# Patient Record
Sex: Female | Born: 1935 | Race: White | Hispanic: No | Marital: Married | Smoking: Never smoker
Health system: Southern US, Community
[De-identification: ages and names within clinical notes are randomized; demographics above are authoritative.]

## PROBLEM LIST (undated history)

## (undated) DIAGNOSIS — I255 Ischemic cardiomyopathy: Secondary | ICD-10-CM

## (undated) DIAGNOSIS — N390 Urinary tract infection, site not specified: Secondary | ICD-10-CM

## (undated) DIAGNOSIS — E785 Hyperlipidemia, unspecified: Secondary | ICD-10-CM

## (undated) DIAGNOSIS — M1990 Unspecified osteoarthritis, unspecified site: Secondary | ICD-10-CM

## (undated) DIAGNOSIS — I10 Essential (primary) hypertension: Secondary | ICD-10-CM

## (undated) DIAGNOSIS — M549 Dorsalgia, unspecified: Secondary | ICD-10-CM

## (undated) DIAGNOSIS — G8929 Other chronic pain: Secondary | ICD-10-CM

## (undated) DIAGNOSIS — I219 Acute myocardial infarction, unspecified: Secondary | ICD-10-CM

## (undated) DIAGNOSIS — C801 Malignant (primary) neoplasm, unspecified: Secondary | ICD-10-CM

## (undated) DIAGNOSIS — R42 Dizziness and giddiness: Secondary | ICD-10-CM

## (undated) DIAGNOSIS — E039 Hypothyroidism, unspecified: Secondary | ICD-10-CM

## (undated) DIAGNOSIS — F0390 Unspecified dementia without behavioral disturbance: Secondary | ICD-10-CM

## (undated) DIAGNOSIS — R739 Hyperglycemia, unspecified: Secondary | ICD-10-CM

## (undated) DIAGNOSIS — N184 Chronic kidney disease, stage 4 (severe): Secondary | ICD-10-CM

## (undated) DIAGNOSIS — E1121 Type 2 diabetes mellitus with diabetic nephropathy: Secondary | ICD-10-CM

## (undated) DIAGNOSIS — I2510 Atherosclerotic heart disease of native coronary artery without angina pectoris: Secondary | ICD-10-CM

## (undated) HISTORY — PX: BREAST LUMPECTOMY: SHX2

## (undated) HISTORY — PX: CORONARY STENT PLACEMENT: SHX1402

## (undated) HISTORY — PX: BACK SURGERY: SHX140

---

## 2003-12-15 ENCOUNTER — Ambulatory Visit: Payer: Self-pay | Admitting: Family Medicine

## 2004-01-02 ENCOUNTER — Ambulatory Visit: Payer: Self-pay | Admitting: Family Medicine

## 2007-04-13 ENCOUNTER — Emergency Department (HOSPITAL_COMMUNITY): Admission: EM | Admit: 2007-04-13 | Discharge: 2007-04-13 | Payer: Self-pay | Admitting: Emergency Medicine

## 2011-01-04 DIAGNOSIS — I219 Acute myocardial infarction, unspecified: Secondary | ICD-10-CM

## 2011-01-04 HISTORY — DX: Acute myocardial infarction, unspecified: I21.9

## 2011-04-20 ENCOUNTER — Encounter (HOSPITAL_COMMUNITY): Payer: Self-pay | Admitting: Cardiology

## 2011-04-20 ENCOUNTER — Other Ambulatory Visit: Payer: Self-pay

## 2011-04-20 ENCOUNTER — Inpatient Hospital Stay (HOSPITAL_COMMUNITY)
Admission: AD | Admit: 2011-04-20 | Discharge: 2011-04-23 | DRG: 247 | Disposition: A | Discharge status: Home / Self Care | Payer: Medicare Other | Source: Other Acute Inpatient Hospital | Attending: Cardiology | Admitting: Cardiology

## 2011-04-20 ENCOUNTER — Encounter (HOSPITAL_COMMUNITY)
Admission: AD | Disposition: A | Discharge status: Home / Self Care | Payer: Self-pay | Source: Other Acute Inpatient Hospital | Attending: Cardiology

## 2011-04-20 DIAGNOSIS — I2510 Atherosclerotic heart disease of native coronary artery without angina pectoris: Secondary | ICD-10-CM | POA: Diagnosis present

## 2011-04-20 DIAGNOSIS — I2109 ST elevation (STEMI) myocardial infarction involving other coronary artery of anterior wall: Secondary | ICD-10-CM

## 2011-04-20 DIAGNOSIS — E039 Hypothyroidism, unspecified: Secondary | ICD-10-CM | POA: Diagnosis present

## 2011-04-20 DIAGNOSIS — E785 Hyperlipidemia, unspecified: Secondary | ICD-10-CM | POA: Diagnosis present

## 2011-04-20 DIAGNOSIS — Z7982 Long term (current) use of aspirin: Secondary | ICD-10-CM

## 2011-04-20 DIAGNOSIS — I214 Non-ST elevation (NSTEMI) myocardial infarction: Principal | ICD-10-CM

## 2011-04-20 DIAGNOSIS — I959 Hypotension, unspecified: Secondary | ICD-10-CM | POA: Diagnosis present

## 2011-04-20 DIAGNOSIS — I255 Ischemic cardiomyopathy: Secondary | ICD-10-CM | POA: Diagnosis present

## 2011-04-20 DIAGNOSIS — I2589 Other forms of chronic ischemic heart disease: Secondary | ICD-10-CM | POA: Diagnosis present

## 2011-04-20 DIAGNOSIS — R7309 Other abnormal glucose: Secondary | ICD-10-CM | POA: Diagnosis present

## 2011-04-20 HISTORY — DX: Hyperlipidemia, unspecified: E78.5

## 2011-04-20 HISTORY — PX: LEFT HEART CATHETERIZATION WITH CORONARY ANGIOGRAM: SHX5451

## 2011-04-20 HISTORY — DX: Atherosclerotic heart disease of native coronary artery without angina pectoris: I25.10

## 2011-04-20 HISTORY — DX: Ischemic cardiomyopathy: I25.5

## 2011-04-20 HISTORY — DX: Hyperglycemia, unspecified: R73.9

## 2011-04-20 HISTORY — DX: Hypothyroidism, unspecified: E03.9

## 2011-04-20 LAB — CARDIAC PANEL(CRET KIN+CKTOT+MB+TROPI)
CK, MB: 36.3 ng/mL (ref 0.3–4.0)
Relative Index: 10.1 — ABNORMAL HIGH (ref 0.0–2.5)
Relative Index: 10.5 — ABNORMAL HIGH (ref 0.0–2.5)
Total CK: 358 U/L — ABNORMAL HIGH (ref 7–177)
Total CK: 255 U/L — ABNORMAL HIGH (ref 7–177)

## 2011-04-20 LAB — TSH: TSH: 0.911 u[IU]/mL (ref 0.350–4.500)

## 2011-04-20 LAB — APTT: aPTT: 66 seconds — ABNORMAL HIGH (ref 24–37)

## 2011-04-20 LAB — MRSA PCR SCREENING: MRSA by PCR: NEGATIVE

## 2011-04-20 LAB — HEMOGLOBIN A1C: Mean Plasma Glucose: 111 mg/dL (ref <117)

## 2011-04-20 LAB — PROTIME-INR: Prothrombin Time: 13.8 seconds (ref 11.6–15.2)

## 2011-04-20 LAB — POCT ACTIVATED CLOTTING TIME: Activated Clotting Time: 100 seconds

## 2011-04-20 SURGERY — LEFT HEART CATHETERIZATION WITH CORONARY ANGIOGRAM
Anesthesia: LOCAL

## 2011-04-20 MED ORDER — NITROGLYCERIN 0.2 MG/ML ON CALL CATH LAB
INTRAVENOUS | Status: AC
  Filled 2011-04-20: qty 1

## 2011-04-20 MED ORDER — ZOLPIDEM TARTRATE 5 MG PO TABS: 5.0000 mg | ORAL_TABLET | Freq: Every evening | ORAL | Status: DC | PRN

## 2011-04-20 MED ORDER — DIAZEPAM 5 MG PO TABS
5.0000 mg | ORAL_TABLET | ORAL | Status: AC
  Administered 2011-04-20: 5 mg via ORAL
  Filled 2011-04-20: qty 1

## 2011-04-20 MED ORDER — SODIUM CHLORIDE 0.9 % IJ SOLN: 3.0000 mL | INTRAMUSCULAR | Status: DC | PRN

## 2011-04-20 MED ORDER — NITROGLYCERIN 0.4 MG SL SUBL
0.4000 mg | SUBLINGUAL_TABLET | SUBLINGUAL | Status: DC | PRN
  Administered 2011-04-21 (×2): 0.4 mg via SUBLINGUAL
  Filled 2011-04-20: qty 75

## 2011-04-20 MED ORDER — OMEGA-3-ACID ETHYL ESTERS 1 G PO CAPS
1.0000 g | ORAL_CAPSULE | Freq: Every day | ORAL | Status: DC
  Administered 2011-04-21 – 2011-04-23 (×3): 1 g via ORAL
  Filled 2011-04-20 (×4): qty 1

## 2011-04-20 MED ORDER — LIDOCAINE HCL (PF) 1 % IJ SOLN
INTRAMUSCULAR | Status: AC
  Filled 2011-04-20: qty 30

## 2011-04-20 MED ORDER — SODIUM CHLORIDE 0.9 % IV SOLN: 250.0000 mL | INTRAVENOUS | Status: DC | PRN

## 2011-04-20 MED ORDER — LEVOTHYROXINE SODIUM 100 MCG PO TABS
100.0000 ug | ORAL_TABLET | Freq: Every day | ORAL | Status: DC
  Administered 2011-04-21 – 2011-04-23 (×3): 100 ug via ORAL
  Filled 2011-04-20 (×4): qty 1

## 2011-04-20 MED ORDER — SODIUM CHLORIDE 0.9 % IV SOLN
1.0000 mL/kg/h | INTRAVENOUS | Status: DC
  Administered 2011-04-20: 1 mL/kg/h via INTRAVENOUS

## 2011-04-20 MED ORDER — ASPIRIN 81 MG PO CHEW
81.0000 mg | CHEWABLE_TABLET | Freq: Every day | ORAL | Status: DC
Start: 2011-04-21 — End: 2011-04-23
  Administered 2011-04-22 – 2011-04-23 (×2): 81 mg via ORAL
  Filled 2011-04-20 (×2): qty 1

## 2011-04-20 MED ORDER — ASPIRIN 81 MG PO CHEW
324.0000 mg | CHEWABLE_TABLET | ORAL | Status: AC
  Administered 2011-04-20: 324 mg via ORAL
  Filled 2011-04-20: qty 15
  Filled 2011-04-20: qty 4

## 2011-04-20 MED ORDER — HEPARIN (PORCINE) IN NACL 2-0.9 UNIT/ML-% IJ SOLN
INTRAMUSCULAR | Status: AC
  Filled 2011-04-20: qty 2000

## 2011-04-20 MED ORDER — FENTANYL CITRATE 0.05 MG/ML IJ SOLN
INTRAMUSCULAR | Status: AC
  Filled 2011-04-20: qty 2

## 2011-04-20 MED ORDER — ACETAMINOPHEN 325 MG PO TABS: 650.0000 mg | ORAL_TABLET | ORAL | Status: DC | PRN

## 2011-04-20 MED ORDER — OMEGA-3 FATTY ACIDS 1000 MG PO CAPS: 1.0000 g | ORAL_CAPSULE | Freq: Every day | ORAL | Status: DC

## 2011-04-20 MED ORDER — METOPROLOL TARTRATE 12.5 MG HALF TABLET
12.5000 mg | ORAL_TABLET | Freq: Two times a day (BID) | ORAL | Status: DC
  Administered 2011-04-20 – 2011-04-23 (×6): 12.5 mg via ORAL
  Filled 2011-04-20 (×8): qty 1

## 2011-04-20 MED ORDER — ATORVASTATIN CALCIUM 80 MG PO TABS
80.0000 mg | ORAL_TABLET | Freq: Every day | ORAL | Status: DC
  Administered 2011-04-20 – 2011-04-22 (×3): 80 mg via ORAL
  Filled 2011-04-20 (×5): qty 1

## 2011-04-20 MED ORDER — ONDANSETRON HCL 4 MG/2ML IJ SOLN: 4.0000 mg | Freq: Four times a day (QID) | INTRAMUSCULAR | Status: DC | PRN

## 2011-04-20 MED ORDER — SODIUM CHLORIDE 0.9 % IV SOLN: INTRAVENOUS | Status: AC

## 2011-04-20 MED ORDER — SODIUM CHLORIDE 0.9 % IJ SOLN
3.0000 mL | Freq: Two times a day (BID) | INTRAMUSCULAR | Status: DC
  Administered 2011-04-21 – 2011-04-23 (×5): 3 mL via INTRAVENOUS

## 2011-04-20 MED ORDER — HEPARIN (PORCINE) IN NACL 100-0.45 UNIT/ML-% IJ SOLN
900.0000 [IU]/h | INTRAMUSCULAR | Status: DC
  Administered 2011-04-21: 900 [IU]/h via INTRAVENOUS
  Filled 2011-04-20: qty 250

## 2011-04-20 NOTE — Progress Notes (Signed)
TO CATH LAB BY BED.

## 2011-04-20 NOTE — Progress Notes (Signed)
BACK FROM THE CATH . LAB BY BED AWAKE AND ALERT. INSTRUCTED NOT TO BEND RIGHT KNEE AND BEDREST EMPHASIZED. TO CALL FOR ANY SIGN OF BLEEDING TO RIGHT GROIN. CONTINUE TO MONITOR.

## 2011-04-20 NOTE — Progress Notes (Signed)
ANTICOAGULATION CONSULT NOTE - Initial Consult  Pharmacy Consult for Heparin Indication: chest pain/ACS  Allergies  Allergen Reactions  . Penicillins     Rash     Patient Measurements: Height: 5\' 2"  (157.5 cm) Weight: 152 lb (68.947 kg) IBW/kg (Calculated) : 50.1  Heparin Dosing Weight: 68.9 kg  Vital Signs: Temp: 98 F (36.7 C) (04/17 1600) Temp src: Oral (04/17 1600) BP: 111/95 mmHg (04/17 1500) Pulse Rate: 72  (04/17 1500)  Labs: No results found for this basename: HGB:2,HCT:3,PLT:3,APTT:3,LABPROT:3,INR:3,HEPARINUNFRC:3,CREATININE:3,CKTOTAL:3,CKMB:3,TROPONINI:3 in the last 72 hours CrCl is unknown because no creatinine reading has been taken.  Medical History: Past Medical History  Diagnosis Date  . Hypothyroidism     Medications:  Infusions:    . sodium chloride      Assessment: 78yo female admitted from [facility] with chest pain and ACS with positive Troponin found to have NSTEMI on EKG. Patient has no prior cardiac history. Currently chest pain free. Plan for cardiac cath to start IV heparin.   No labs available.    Goal of Therapy:  Heparin level 0.3-0.7 units/ml   Plan:  1. Patient is already in cath. 2. Will follow-up post-cath plan.   Fayne Norrie 04/20/2011,4:13 PM

## 2011-04-20 NOTE — H&P (Signed)
History and Physical  Patient ID: Joann Hunter MRN: [id number], SOB: 15-Sep-1932 78 y.o. Date of Encounter: 04/20/2011, 3:50 PM  Primary Physician: Dr. Charm Barges Primary Cardiologist: New  Chief Complaint: NSTEMI  HPI: 78 y.o. female w/ PMHx significant for hypothyroidism and no prior cardiac history who presented to her PCP with c/o chest pain for which she was sent to [facility] where she was found to have an elevated troponin and then transferred to [facility] on 04/20/2011.  She was in her usual state of health until about 3 weeks ago when she noted sob and substernal chest pressure while working in her yard. The pain lasted about 5-10 mins and was relieved with rest. No radiation, nausea, diaphoresis, dizziness, or syncope. Over the last 3wks she has had occasional episodes of exertional cp and dyspnea with activities around the house that normally did not cause her problems like taking the trash to the curb. This morning she was awakened with chest pain, sob, and significant diaphoresis that was not relieved with rest. She went to her PCP who gave her ASA with relief of chest pain (lasted ~1hr) and sent her to [facility] ED.  In the [facility] ED, EKG revealed NSR 84bpm, old anterior infarct, poor R wave progression. CXR showed mild interstitial edema. Labs Na+ 137, K+ 3.9, BUN/Crt 16/0.86, GFR >60, glucose 168, troponin 0.41, CKMB 7.1, total CK 90, WBC 5.2, H&H 13.6/42, plt 171. She was chest pain free in the ED. Started on IV heparin and transferred to [facility] stepdown unit for further evaluation and treatment. EKG at [facility] shows TWI I, aVL, V1-V6. She is chest pain free.   Past Medical History  Diagnosis Date  . Hypothyroidism      Surgical History:  Past Surgical History  Procedure Date  . Breast lumpectomy     right     Home Meds: Medication Sig  aspirin 81 MG chewable tablet Chew 81 mg by mouth daily.  fish oil-omega-3 fatty acids 1000 MG capsule Take 1 g by  mouth daily.  levothyroxine (SYNTHROID, LEVOTHROID) 100 MCG tablet Take 100 mcg by mouth daily.    Allergies:  Allergies  Allergen Reactions  . Penicillins     Rash    Social History  . Marital Status: Married   Occupational History  . Retired   Social History Main Topics  . Smoking status: Never Smoker   . Smokeless tobacco: Never Used  . Alcohol Use: No  . Drug Use: No   Social History Narrative   Lives in [place], [place] with her husband and daughter.     Family History  Problem Relation Age of Onset  . Other      no known family cardiac disease    Review of Systems: General: negative for chills, fever, night sweats or weight changes.  Cardiovascular: As per HPI Dermatological: negative for rash Respiratory: negative for cough or wheezing Urologic: negative for hematuria Abdominal: negative for nausea, vomiting, diarrhea, bright red blood per rectum, melena, or hematemesis Neurologic: negative for visual changes, syncope, or dizziness All other systems reviewed and are otherwise negative except as noted above.  Labs: 04/20/11 @ [facility] Na+ 137, K+ 3.9, BUN/Crt 16/0.86, GFR >60, glucose 168, troponin 0.41, CKMB 7.1, total CK 90, WBC 5.2, H&H 13.6/42, plt 171   Radiology/Studies:   04/20/11 - CXR at [facility] showed mild interstitial edema   EKG: 04/20/11 @ 0931 - SR 84bpm, old anterior infarct, poor R wave progression  04/20/11 @ 1515 - SR 72bpm, poor R wave progression, TWI I, aVL, V1-V6  Physical Exam: Blood pressure 111/95, pulse 72, temperature 97.7 F (36.5 C), temperature source Oral, resp. rate 20, height 5\' 2"  (1.575 m), weight 152 lb (68.947 kg), SpO2 99.00%. General: Well developed, well nourished, elderly white female in no acute distress. Head: Normocephalic, atraumatic, sclera non-icteric, nares are without discharge Neck: Supple. No carotid bruits. JVD not elevated. Lungs: Clear bilaterally to auscultation without wheezes, rales,  or rhonchi. Breathing is unlabored. Heart: RRR with S1 S2. Soft flow murmur LUSB. No rubs or gallops appreciated. Abdomen: Soft, non-tender, non-distended with normoactive bowel sounds. No rebound/guarding. No obvious abdominal masses. Msk:  Strength and tone appear normal for age. Extremities: Trace BLE edema. No clubbing or cyanosis. Distal pedal pulses are intact and equal bilaterally. Neuro: Alert and oriented X 3. Moves all extremities spontaneously. Psych:  Responds to questions appropriately with a normal affect.    ASSESSMENT AND PLAN:  78 y.o. female w/ PMHx significant for hypothyroidism and no prior cardiac history who presented to her PCP with c/o chest pain for which she was sent to [facility] where she was found to have an elevated troponin and then transferred to [facility] on 04/20/2011.  1. NSTEMI: She presents with 3wks of chest pain that worsened this morning. No prior cardiac history. Troponin positive and EKG with poor R wave progression and TWIs. Currently chest pain free. Will need evaluation with cardiac cath. Cont IV heparin and daily ASA. Initiate metoprolol 12.5mg  BID and statin. Check lipid panel, LFTs, TSH, A1C.   Signed, HOPE, JESSICA PA-C 04/20/2011, 3:50 PM  History reviewed with the patient, no changes to be made.  Patient with exertional chest pain over 2 weeks.  However, it progress to resting chest pain that woke her from her sleep this am.  Presented to [facility] with T wave inversion in the anterior leads.  First troponin is positive.  She has been pain free since this am.  No prior cardiac history.  The patient exam reveals Lungs clear, COR soft apical systolic flow murmur, Abd Positive bowel sounds, no rebound no guarding, Ext no edema.  All available labs, radiology testing, previous records reviewed. Agree with documented assessment and plan.  Cath.  The patient understands that risks included but are not limited to stroke (1 in 1000), death (1  in 1000), kidney failure [usually temporary] (1 in 500), bleeding (1 in 200), allergic reaction [possibly serious] (1 in 200).  The patient understands and agrees to proceed.  Start empiric statin and check lipid profile.    Fayrene Fearing Kit Brubacher  4:06 PM 11/19/2010

## 2011-04-20 NOTE — Progress Notes (Addendum)
ADMITTED FROM  [facility] ED BY [facility] AWAKE AND ALERT. HEPARIN AT 6 ML IN PROGRESS TO RIGHT AC.

## 2011-04-20 NOTE — Interval H&P Note (Signed)
History and Physical Interval Note:  04/20/2011 4:36 PM  Joann Hunter  has presented today for surgery, with the diagnosis of cp  The various methods of treatment have been discussed with the patient and family. After consideration of risks, benefits and other options for treatment, the patient has consented to  Procedure(s) (LRB): LEFT HEART CATHETERIZATION WITH CORONARY ANGIOGRAM (N/A) as a surgical intervention .  The patients' history has been reviewed, patient examined, no change in status, stable for surgery.  I have reviewed the patients' chart and labs.  Questions were answered to the patient's satisfaction.     Samera Macy

## 2011-04-20 NOTE — Progress Notes (Signed)
ANTICOAGULATION CONSULT NOTE - Initial Consult  Pharmacy Consult for heparin Indication: severe triple vessel CAD s/p cath. Potential CABG  Allergies  Allergen Reactions  . Penicillins     Rash     Patient Measurements: Height: 5\' 2"  (157.5 cm) Weight: 152 lb (68.947 kg) IBW/kg (Calculated) : 50.1  Heparin Dosing Weight: 64.5 kg  Vital Signs: Temp: 98 F (36.7 C) (04/17 1600) Temp src: Oral (04/17 1600) BP: 111/95 mmHg (04/17 1500) Pulse Rate: 77  (04/17 1645)  Labs:  Basename 04/20/11 1558  HGB --  HCT --  PLT --  APTT 66*  LABPROT 13.8  INR 1.04  HEPARINUNFRC --  CREATININE --  CKTOTAL 358*  CKMB 36.3*  TROPONINI 12.70*   CrCl is unknown because no creatinine reading has been taken.  Medical History: Past Medical History  Diagnosis Date  . Hypothyroidism     Medications:  Scheduled:    . aspirin  324 mg Oral Pre-Cath  . aspirin  81 mg Oral Daily  . atorvastatin  80 mg Oral q1800  . diazepam  5 mg Oral On Call  . fentaNYL      . heparin      . levothyroxine  100 mcg Oral Daily  . lidocaine      . metoprolol tartrate  12.5 mg Oral BID  . nitroGLYCERIN      . omega-3 acid ethyl esters  1 g Oral Daily  . sodium chloride  3 mL Intravenous Q12H  . DISCONTD: fish oil-omega-3 fatty acids  1 g Oral Daily   Infusions:    . sodium chloride 50 mL/hr at 04/20/11 1736  . DISCONTD: sodium chloride 1 mL/kg/hr (04/20/11 1600)    Assessment: 78 yo female with severe triple vessels CAD s/p cath will be restarted on heparin 8 hours post sheath removal. Sheath was removed at ~1735. INR today 1.04.  May potentially have CABG Goal of Therapy:  Heparin level 0.3-0.7 units/ml   Plan:  1) Start heparin drip at 900 units/hr at 0135 on 04/21/11. No bolus 2) Check an 8 hour heparin level after drip is started. 3) Daily heparin level and CBC Lavonda Thal, Tsz-Yin 04/20/2011,6:23 PM

## 2011-04-20 NOTE — CV Procedure (Signed)
   Cardiac Catheterization Operative Report  Joann Hunter [id number] 4/17/20134:36 PM No primary provider on file.  Procedure Performed:  1. Left Heart Catheterization 2. Selective Coronary Angiography 3. Left ventricular angiogram  Operator: Verne Carrow, MD  Indication:   NSTEMI, chest pain, EKG changes.                                    Procedure Details: The risks, benefits, complications, treatment options, and expected outcomes were discussed with the patient. The patient and/or family concurred with the proposed plan, giving informed consent. The patient was brought to the cath lab after IV hydration was begun and oral premedication was given. The patient was further sedated with Fentanyl. The right groin was prepped and draped in the usual manner. Using the modified Seldinger access technique, a 5 French sheath was placed in the right femoral artery. Standard diagnostic catheters were used to perform selective coronary angiography. A pigtail catheter was used to perform a left ventricular angiogram.  There were no immediate complications. The patient was taken to the recovery area in stable condition.   Hemodynamic Findings: Central aortic pressure: 137/64 Left ventricular pressure: 137/4/11  Angiographic Findings:  Left main:  Distal 40% stenosis.   Left Anterior Descending Artery: Large caliber vessel that courses to the apex. The proximal vessel has diffuse 40% calcified stenosis. The mid LAD has 99% stenosis. The first diagonal branch is small to moderate sized with ostial 90% stenosis and mid 90% stenosis. The second diagonal branch is small to moderate sized with ostial 70% stenosis. The distal LAD has diffuse 30% stenosis.   Circumflex Artery: Moderate sized vessel with 80% ostial stenosis. The proximal and mid vessel has serial 30% stenoses. There is a small caliber intermediate branch with 99% proximal stenosis. The OM is a bifurcating vessel with 40%  stenosis in the superior branch. The inferior branch of the OM has mild plaque disease.   Right Coronary Artery: Large, dominant vessel with 50% diffuse proximal stenosis and 100% mid occlusion. The distal vessel fills from left to right collaterals.   Left Ventricular Angiogram: LVEF 45% with hypokinesis of the anterior wall, apex and inferoapical wall.    Impression: 1. Severe triple vessel CAD 2. Segmental LV systolic dysfunction 3. NSTEMI  Recommendations: She will be admitted to the TCU. We will consult CT surgery for CABG given her severe three vessel disease. Will start heparin drip 8 hours post sheath removal. Will continue ASA, beta blocker, statin.        Complications:  None. The patient tolerated the procedure well.

## 2011-04-21 ENCOUNTER — Ambulatory Visit (HOSPITAL_COMMUNITY): Admit: 2011-04-21 | Payer: Medicare Other | Admitting: Cardiovascular Disease

## 2011-04-21 ENCOUNTER — Other Ambulatory Visit: Payer: Self-pay

## 2011-04-21 ENCOUNTER — Encounter (HOSPITAL_COMMUNITY)
Admission: AD | Disposition: A | Discharge status: Home / Self Care | Payer: Self-pay | Source: Other Acute Inpatient Hospital | Attending: Cardiology

## 2011-04-21 ENCOUNTER — Encounter (HOSPITAL_COMMUNITY): Payer: Self-pay | Admitting: *Deleted

## 2011-04-21 ENCOUNTER — Encounter (HOSPITAL_COMMUNITY): Payer: Self-pay

## 2011-04-21 DIAGNOSIS — I214 Non-ST elevation (NSTEMI) myocardial infarction: Secondary | ICD-10-CM

## 2011-04-21 HISTORY — PX: PERCUTANEOUS CORONARY STENT INTERVENTION (PCI-S): SHX5485

## 2011-04-21 LAB — CREATININE, SERUM
Creatinine, Ser: 0.79 mg/dL (ref 0.50–1.10)
GFR calc Af Amer: >90 mL/min (ref >90)
GFR calc non Af Amer: 78 mL/min — ABNORMAL LOW (ref >90)

## 2011-04-21 LAB — CARDIAC PANEL(CRET KIN+CKTOT+MB+TROPI)
CK, MB: 6.7 ng/mL (ref 0.3–4.0)
CK, MB: 5.4 ng/mL — ABNORMAL HIGH (ref 0.3–4.0)
Relative Index: 10.8 — ABNORMAL HIGH (ref 0.0–2.5)
Relative Index: INVALID (ref 0.0–2.5)
Total CK: 77 U/L (ref 7–177)
Total CK: 66 U/L (ref 7–177)
Troponin I: 9.98 ng/mL (ref <0.30)
Troponin I: 4.2 ng/mL (ref <0.30)

## 2011-04-21 LAB — COMPREHENSIVE METABOLIC PANEL
ALT: 10 U/L (ref 0–35)
AST: 40 U/L — ABNORMAL HIGH (ref 0–37)
Albumin: 3.2 g/dL — ABNORMAL LOW (ref 3.5–5.2)
Alkaline Phosphatase: 62 U/L (ref 39–117)
CO2: 25 mEq/L (ref 19–32)
Chloride: 103 mEq/L (ref 96–112)
Creatinine, Ser: 0.8 mg/dL (ref 0.50–1.10)
GFR calc non Af Amer: 69 mL/min — ABNORMAL LOW (ref >90)
Potassium: 3.7 mEq/L (ref 3.5–5.1)
Sodium: 136 mEq/L (ref 135–145)
Total Bilirubin: 0.5 mg/dL (ref 0.3–1.2)

## 2011-04-21 LAB — CBC
Hemoglobin: 12.6 g/dL (ref 12.0–15.0)
MCHC: 34.8 g/dL (ref 30.0–36.0)
MCV: 90.1 fL (ref 78.0–100.0)
Platelets: 143 10*3/uL — ABNORMAL LOW (ref 150–400)
RBC: 3.95 MIL/uL (ref 3.87–5.11)
RBC: 4.01 MIL/uL (ref 3.87–5.11)
RDW: 13.2 % (ref 11.5–15.5)
WBC: 4.2 10*3/uL (ref 4.0–10.5)
WBC: 7.1 10*3/uL (ref 4.0–10.5)

## 2011-04-21 LAB — LIPID PANEL
Cholesterol: 205 mg/dL — ABNORMAL HIGH (ref 0–200)
HDL: 37 mg/dL — ABNORMAL LOW (ref >39)
Total CHOL/HDL Ratio: 5.5 RATIO
VLDL: 19 mg/dL (ref 0–40)

## 2011-04-21 LAB — POCT ACTIVATED CLOTTING TIME: Activated Clotting Time: 430 seconds

## 2011-04-21 SURGERY — LEFT HEART CATHETERIZATION WITH CORONARY ANGIOGRAM
Anesthesia: LOCAL

## 2011-04-21 SURGERY — PERCUTANEOUS CORONARY STENT INTERVENTION (PCI-S)
Anesthesia: LOCAL

## 2011-04-21 MED ORDER — NITROGLYCERIN 0.2 MG/ML ON CALL CATH LAB
INTRAVENOUS | Status: AC
  Filled 2011-04-21: qty 1

## 2011-04-21 MED ORDER — TICAGRELOR 90 MG PO TABS
90.0000 mg | ORAL_TABLET | Freq: Two times a day (BID) | ORAL | Status: DC
  Administered 2011-04-21 – 2011-04-23 (×4): 90 mg via ORAL
  Filled 2011-04-21 (×6): qty 1

## 2011-04-21 MED ORDER — BIVALIRUDIN 250 MG IV SOLR
INTRAVENOUS | Status: AC
  Filled 2011-04-21: qty 250

## 2011-04-21 MED ORDER — SODIUM CHLORIDE 0.9 % IV SOLN: 1.0000 mL/kg/h | INTRAVENOUS | Status: AC

## 2011-04-21 MED ORDER — MORPHINE SULFATE 2 MG/ML IJ SOLN
2.0000 mg | INTRAMUSCULAR | Status: DC | PRN
  Administered 2011-04-21: 2 mg via INTRAVENOUS
  Filled 2011-04-21: qty 1

## 2011-04-21 MED ORDER — ASPIRIN 81 MG PO CHEW
324.0000 mg | CHEWABLE_TABLET | ORAL | Status: AC
  Administered 2011-04-21: 324 mg via ORAL
  Filled 2011-04-21: qty 4

## 2011-04-21 MED ORDER — ENOXAPARIN SODIUM 40 MG/0.4ML ~~LOC~~ SOLN
40.0000 mg | SUBCUTANEOUS | Status: DC
  Administered 2011-04-22 – 2011-04-23 (×2): 40 mg via SUBCUTANEOUS
  Filled 2011-04-21 (×2): qty 0.4

## 2011-04-21 MED ORDER — DIAZEPAM 5 MG PO TABS
5.0000 mg | ORAL_TABLET | ORAL | Status: AC
  Administered 2011-04-21: 5 mg via ORAL
  Filled 2011-04-21: qty 1

## 2011-04-21 MED ORDER — LIDOCAINE HCL (PF) 1 % IJ SOLN
INTRAMUSCULAR | Status: AC
  Filled 2011-04-21: qty 30

## 2011-04-21 MED ORDER — ONDANSETRON HCL 4 MG/2ML IJ SOLN: 4.0000 mg | Freq: Four times a day (QID) | INTRAMUSCULAR | Status: DC | PRN

## 2011-04-21 MED ORDER — TICAGRELOR 90 MG PO TABS
180.0000 mg | ORAL_TABLET | ORAL | Status: AC
  Administered 2011-04-21: 180 mg via ORAL
  Filled 2011-04-21: qty 2

## 2011-04-21 MED ORDER — SODIUM CHLORIDE 0.9 % IV SOLN
1.0000 mL/kg/h | INTRAVENOUS | Status: DC
  Administered 2011-04-21: 1 mL/kg/h via INTRAVENOUS

## 2011-04-21 MED ORDER — SODIUM CHLORIDE 0.9 % IJ SOLN: 3.0000 mL | INTRAMUSCULAR | Status: DC | PRN

## 2011-04-21 MED ORDER — NITROGLYCERIN IN D5W 200-5 MCG/ML-% IV SOLN
INTRAVENOUS | Status: AC
  Administered 2011-04-21: 08:00:00
  Filled 2011-04-21: qty 250

## 2011-04-21 MED ORDER — SODIUM CHLORIDE 0.9 % IV SOLN: 250.0000 mL | INTRAVENOUS | Status: DC | PRN

## 2011-04-21 MED ORDER — SODIUM CHLORIDE 0.9 % IV SOLN: 250.0000 mL | INTRAVENOUS | Status: DC

## 2011-04-21 MED ORDER — SODIUM CHLORIDE 0.9 % IJ SOLN
3.0000 mL | Freq: Two times a day (BID) | INTRAMUSCULAR | Status: DC
  Administered 2011-04-21 – 2011-04-23 (×3): 3 mL via INTRAVENOUS

## 2011-04-21 MED ORDER — MIDAZOLAM HCL 2 MG/2ML IJ SOLN
INTRAMUSCULAR | Status: AC
  Filled 2011-04-21: qty 2

## 2011-04-21 MED ORDER — ACETAMINOPHEN 325 MG PO TABS: 650.0000 mg | ORAL_TABLET | ORAL | Status: DC | PRN

## 2011-04-21 MED ORDER — HEPARIN (PORCINE) IN NACL 2-0.9 UNIT/ML-% IJ SOLN
INTRAMUSCULAR | Status: AC
  Filled 2011-04-21: qty 2000

## 2011-04-21 MED ORDER — FENTANYL CITRATE 0.05 MG/ML IJ SOLN
INTRAMUSCULAR | Status: AC
  Filled 2011-04-21: qty 2

## 2011-04-21 MED ORDER — NITROGLYCERIN IN D5W 200-5 MCG/ML-% IV SOLN
5.0000 ug/min | INTRAVENOUS | Status: DC
  Administered 2011-04-21: 5 ug/min via INTRAVENOUS

## 2011-04-21 NOTE — Progress Notes (Signed)
UR Completed. Simmons, Sarahy Creedon F [phone]  

## 2011-04-21 NOTE — Progress Notes (Signed)
COMPLAINED OF CHEST PRESSURE NON RADIATING SCALE OF 5/10. BP 142/69, HR 74, R- 14, 98 % PULSE OX ON O2 2L [place]. EKG DONE WITH NO CHANGES, MD AWARE. STARTED ON NITRO GTT AT 5 MCG/KG. AFFORDED RELIEF AFTER FEW MIN. CONTINUE TO MONITOR.

## 2011-04-21 NOTE — Progress Notes (Signed)
BACK FROM THE CATH LAB. WITH TR BAND INTACT TO RIGHT WRIST WITH 14 ML AIR. DENIED ANY DISCOMFORT. ARM ELEVATED WITH PILLOW WITH  O2 PROBE TO  RIGHT WRIST. + RADIAL AND ULNAR PULSES. CONTINUE TO MONITOR.

## 2011-04-21 NOTE — Progress Notes (Signed)
TO CATH. LAB BY BED STABLE.

## 2011-04-21 NOTE — Interval H&P Note (Signed)
History and Physical Interval Note:  04/21/2011 8:53 AM  Joann Hunter  has presented today for surgery, with the diagnosis of cad  The various methods of treatment have been discussed with the patient and family. After consideration of risks, benefits and other options for treatment, the patient has consented to  Procedure(s) (LRB): PERCUTANEOUS CORONARY STENT INTERVENTION (PCI-S) (N/A) as a surgical intervention .  The patients' history has been reviewed, patient examined, no change in status, stable for surgery.  I have reviewed the patients' chart and labs.  Questions were answered to the patient's satisfaction.    I have discussed the patient's clinical situation with Dr. Antoine Poche. She has developed severe substernal chest pain this morning. Her catheterization films were reviewed. We plan on PCI of the LAD and potentially staged intervention of the occluded right coronary artery, depending on the patient's clinical symptoms following PCI. She is brought to the Cath Lab urgently because of her unstable symptoms. I reviewed the risks, indications, and alternatives to urgent PCI with both the patient and her 2 daughters.   Joann Hunter  04/21/2011 8:53 AM

## 2011-04-21 NOTE — CV Procedure (Signed)
   CARDIAC CATH NOTE  Name: Joann Hunter MRN: [id number] DOB: April 22, 1932  Procedure: PTCA and stenting of the mid LAD  Indication: Non-STEMI  Procedural Details: The right wrist was prepped, draped, and anesthetized with 1% lidocaine. Using the modified Seldinger technique, a 6 Fr sheath was introduced into the radial artery. 2.5 mg nicardipine was administered through the radial sheath. Weight-based bivalirudin was given for anticoagulation. Once a therapeutic ACT was achieved, a 6 [place] XB LAD 3.5 cm guide catheter was inserted.  A cougar coronary guidewire was used to cross the lesion.  The lesion was predilated with a 2.0 x 12 mm balloon.  The lesion was then stented with a 2.5 x 20 mm Promus Element drug-eluting stent.  The stent was postdilated with a 2.5 x 15 mm noncompliant balloon to 16 atmospheres.  Following PCI, there was 0% residual stenosis and TIMI-3 flow. Final angiography confirmed an excellent result. The patient tolerated the procedure well. There were no immediate procedural complications. A TR band was used for radial hemostasis. The patient was transferred to the post catheterization recovery area for further monitoring.  Lesion Data: Vessel: The LAD Percent stenosis (pre): 95 TIMI-flow (pre):  3 Stent:  2.5 x 20 mm Promus element Percent stenosis (post): 0 TIMI-flow (post): 3  Conclusions: Successful PCI of the mid LAD using a drug-eluting stent platform.  Recommendations: Aspirin and brilinta for a minimum of 12 months. Consideration of PCI of the occluded RCA based on symptoms and/or consideration of outpatient nuclear perfusion testing after the patient recovers from her infarct.  [name] 04/21/2011, 9:38 AM

## 2011-04-21 NOTE — Progress Notes (Signed)
 SUBJECTIVE:  Chest pain and jaw pain 8/10 this am.  Hypotensive response to SLNTG.   PHYSICAL EXAM Filed Vitals:   04/21/11 0500 04/21/11 0507 04/21/11 0513 04/21/11 0530  BP:  143/69 119/67 90/46  Pulse:      Temp:      TempSrc:      Resp:  20 20 17  Height:      Weight:      SpO2: 99% 100%     General:  Mildly uncomfortable HEENT:  PERRL Lungs:  Clear Heart:  No murmur, no rubs Abdomen:  Positive bowel sounds, no rebound no guarding Extremities:  No bruising.  No edema Neuro:  Nonfocal   LABS: Lab Results  Component Value Date   CKTOTAL 161 04/21/2011   CKMB 17.4* 04/21/2011   TROPONINI 9.98* 04/21/2011   Results for orders placed during the hospital encounter of 04/20/11 (from the past 24 hour(s))  MRSA PCR SCREENING     Status: Normal   Collection Time   04/20/11  2:34 PM      Component Value Range   MRSA by PCR NEGATIVE  NEGATIVE   CARDIAC PANEL(CRET KIN+CKTOT+MB+TROPI)     Status: Abnormal   Collection Time   04/20/11  3:58 PM      Component Value Range   Total CK 358 (*) 7 - 177 (U/L)   CK, MB 36.3 (*) 0.3 - 4.0 (ng/mL)   Troponin I 12.70 (*) <0.30 (ng/mL)   Relative Index 10.1 (*) 0.0 - 2.5   PROTIME-INR     Status: Normal   Collection Time   04/20/11  3:58 PM      Component Value Range   Prothrombin Time 13.8  11.6 - 15.2 (seconds)   INR 1.04  0.00 - 1.49   APTT     Status: Abnormal   Collection Time   04/20/11  3:58 PM      Component Value Range   aPTT 66 (*) 24 - 37 (seconds)  TSH     Status: Normal   Collection Time   04/20/11  3:58 PM      Component Value Range   TSH 0.911  0.350 - 4.500 (uIU/mL)  MAGNESIUM     Status: Normal   Collection Time   04/20/11  3:58 PM      Component Value Range   Magnesium 2.0  1.5 - 2.5 (mg/dL)  HEMOGLOBIN A1C     Status: Normal   Collection Time   04/20/11  3:58 PM      Component Value Range   Hemoglobin A1C 5.5  <5.7 (%)   Mean Plasma Glucose 111  <117 (mg/dL)  POCT ACTIVATED CLOTTING TIME     Status:  Normal   Collection Time   04/20/11  5:03 PM      Component Value Range   Activated Clotting Time 100    CARDIAC PANEL(CRET KIN+CKTOT+MB+TROPI)     Status: Abnormal   Collection Time   04/20/11  9:28 PM      Component Value Range   Total CK 255 (*) 7 - 177 (U/L)   CK, MB 26.8 (*) 0.3 - 4.0 (ng/mL)   Troponin I 10.18 (*) <0.30 (ng/mL)   Relative Index 10.5 (*) 0.0 - 2.5   CARDIAC PANEL(CRET KIN+CKTOT+MB+TROPI)     Status: Abnormal   Collection Time   04/21/11  3:45 AM      Component Value Range   Total CK 161  7 - 177 (U/L)   CK,   MB 17.4 (*) 0.3 - 4.0 (ng/mL)   Troponin I 9.98 (*) <0.30 (ng/mL)   Relative Index 10.8 (*) 0.0 - 2.5   LIPID PANEL     Status: Abnormal   Collection Time   04/21/11  3:45 AM      Component Value Range   Cholesterol 205 (*) 0 - 200 (mg/dL)   Triglycerides 95  <150 (mg/dL)   HDL 37 (*) >39 (mg/dL)   Total CHOL/HDL Ratio 5.5     VLDL 19  0 - 40 (mg/dL)   LDL Cholesterol 149 (*) 0 - 99 (mg/dL)  CBC     Status: Abnormal   Collection Time   04/21/11  3:45 AM      Component Value Range   WBC 4.2  4.0 - 10.5 (K/uL)   RBC 3.95  3.87 - 5.11 (MIL/uL)   Hemoglobin 12.5  12.0 - 15.0 (g/dL)   HCT 35.6 (*) 36.0 - 46.0 (%)   MCV 90.1  78.0 - 100.0 (fL)   MCH 31.6  26.0 - 34.0 (pg)   MCHC 35.1  30.0 - 36.0 (g/dL)   RDW 13.2  11.5 - 15.5 (%)   Platelets 143 (*) 150 - 400 (K/uL)  COMPREHENSIVE METABOLIC PANEL     Status: Abnormal   Collection Time   04/21/11  3:45 AM      Component Value Range   Sodium 136  135 - 145 (mEq/L)   Potassium 3.7  3.5 - 5.1 (mEq/L)   Chloride 103  96 - 112 (mEq/L)   CO2 25  19 - 32 (mEq/L)   Glucose, Bld 131 (*) 70 - 99 (mg/dL)   BUN 13  6 - 23 (mg/dL)   Creatinine, Ser 0.80  0.50 - 1.10 (mg/dL)   Calcium 8.9  8.4 - 10.5 (mg/dL)   Total Protein 6.1  6.0 - 8.3 (g/dL)   Albumin 3.2 (*) 3.5 - 5.2 (g/dL)   AST 40 (*) 0 - 37 (U/L)   ALT 10  0 - 35 (U/L)   Alkaline Phosphatase 62  39 - 117 (U/L)   Total Bilirubin 0.5  0.3 - 1.2  (mg/dL)   GFR calc non Af Amer 69 (*) >90 (mL/min)   GFR calc Af Amer 80 (*) >90 (mL/min)    Intake/Output Summary (Last 24 hours) at 04/21/11 0717 Last data filed at 04/21/11 0600  Gross per 24 hour  Intake    599 ml  Output   1450 ml  Net   -851 ml    EKG:  NSR, rate 80.  Deep T wave inversion in the anterior and lateral leads.  04/21/2011  ASSESSMENT AND PLAN:  1)  CAD:   Chest pain again this am.  I have reviewed the films with Dr. Cooper.  Given the ongoing pain with hypotension we believe that the better treatment is PCI of the LAD.  I have discussed this with the patient and her daughter and they understand and agree to proceed.  2)  Hyperlipidemia:  Statin started.    3)  Ischemic cardiomyopathy:  Hypotension does not allow med titration.  Joann Hunter 04/21/2011 7:17 AM   

## 2011-04-21 NOTE — H&P (View-Only) (Signed)
SUBJECTIVE:  Chest pain and jaw pain 8/10 this am.  Hypotensive response to SLNTG.   PHYSICAL EXAM Filed Vitals:   04/21/11 0500 04/21/11 0507 04/21/11 0513 04/21/11 0530  BP:  143/69 119/67 90/46  Pulse:      Temp:      TempSrc:      Resp:  20 20 17   Height:      Weight:      SpO2: 99% 100%     General:  Mildly uncomfortable HEENT:  PERRL Lungs:  Clear Heart:  No murmur, no rubs Abdomen:  Positive bowel sounds, no rebound no guarding Extremities:  No bruising.  No edema Neuro:  Nonfocal   LABS: Lab Results  Component Value Date   CKTOTAL 161 04/21/2011   CKMB 17.4* 04/21/2011   TROPONINI 9.98* 04/21/2011   Results for orders placed during the hospital encounter of 04/20/11 (from the past 24 hour(s))  MRSA PCR SCREENING     Status: Normal   Collection Time   04/20/11  2:34 PM      Component Value Range   MRSA by PCR NEGATIVE  NEGATIVE   CARDIAC PANEL(CRET KIN+CKTOT+MB+TROPI)     Status: Abnormal   Collection Time   04/20/11  3:58 PM      Component Value Range   Total CK 358 (*) 7 - 177 (U/L)   CK, MB 36.3 (*) 0.3 - 4.0 (ng/mL)   Troponin I 12.70 (*) <0.30 (ng/mL)   Relative Index 10.1 (*) 0.0 - 2.5   PROTIME-INR     Status: Normal   Collection Time   04/20/11  3:58 PM      Component Value Range   Prothrombin Time 13.8  11.6 - 15.2 (seconds)   INR 1.04  0.00 - 1.49   APTT     Status: Abnormal   Collection Time   04/20/11  3:58 PM      Component Value Range   aPTT 66 (*) 24 - 37 (seconds)  TSH     Status: Normal   Collection Time   04/20/11  3:58 PM      Component Value Range   TSH 0.911  0.350 - 4.500 (uIU/mL)  MAGNESIUM     Status: Normal   Collection Time   04/20/11  3:58 PM      Component Value Range   Magnesium 2.0  1.5 - 2.5 (mg/dL)  HEMOGLOBIN Z6X     Status: Normal   Collection Time   04/20/11  3:58 PM      Component Value Range   Hemoglobin A1C 5.5  <5.7 (%)   Mean Plasma Glucose 111  <117 (mg/dL)  POCT ACTIVATED CLOTTING TIME     Status:  Normal   Collection Time   04/20/11  5:03 PM      Component Value Range   Activated Clotting Time 100    CARDIAC PANEL(CRET KIN+CKTOT+MB+TROPI)     Status: Abnormal   Collection Time   04/20/11  9:28 PM      Component Value Range   Total CK 255 (*) 7 - 177 (U/L)   CK, MB 26.8 (*) 0.3 - 4.0 (ng/mL)   Troponin I 10.18 (*) <0.30 (ng/mL)   Relative Index 10.5 (*) 0.0 - 2.5   CARDIAC PANEL(CRET KIN+CKTOT+MB+TROPI)     Status: Abnormal   Collection Time   04/21/11  3:45 AM      Component Value Range   Total CK 161  7 - 177 (U/L)   CK,  MB 17.4 (*) 0.3 - 4.0 (ng/mL)   Troponin I 9.98 (*) <0.30 (ng/mL)   Relative Index 10.8 (*) 0.0 - 2.5   LIPID PANEL     Status: Abnormal   Collection Time   04/21/11  3:45 AM      Component Value Range   Cholesterol 205 (*) 0 - 200 (mg/dL)   Triglycerides 95  <478 (mg/dL)   HDL 37 (*) >29 (mg/dL)   Total CHOL/HDL Ratio 5.5     VLDL 19  0 - 40 (mg/dL)   LDL Cholesterol 562 (*) 0 - 99 (mg/dL)  CBC     Status: Abnormal   Collection Time   04/21/11  3:45 AM      Component Value Range   WBC 4.2  4.0 - 10.5 (K/uL)   RBC 3.95  3.87 - 5.11 (MIL/uL)   Hemoglobin 12.5  12.0 - 15.0 (g/dL)   HCT 13.0 (*) 86.5 - 46.0 (%)   MCV 90.1  78.0 - 100.0 (fL)   MCH 31.6  26.0 - 34.0 (pg)   MCHC 35.1  30.0 - 36.0 (g/dL)   RDW 78.4  69.6 - 29.5 (%)   Platelets 143 (*) 150 - 400 (K/uL)  COMPREHENSIVE METABOLIC PANEL     Status: Abnormal   Collection Time   04/21/11  3:45 AM      Component Value Range   Sodium 136  135 - 145 (mEq/L)   Potassium 3.7  3.5 - 5.1 (mEq/L)   Chloride 103  96 - 112 (mEq/L)   CO2 25  19 - 32 (mEq/L)   Glucose, Bld 131 (*) 70 - 99 (mg/dL)   BUN 13  6 - 23 (mg/dL)   Creatinine, Ser 2.84  0.50 - 1.10 (mg/dL)   Calcium 8.9  8.4 - 13.2 (mg/dL)   Total Protein 6.1  6.0 - 8.3 (g/dL)   Albumin 3.2 (*) 3.5 - 5.2 (g/dL)   AST 40 (*) 0 - 37 (U/L)   ALT 10  0 - 35 (U/L)   Alkaline Phosphatase 62  39 - 117 (U/L)   Total Bilirubin 0.5  0.3 - 1.2  (mg/dL)   GFR calc non Af Amer 69 (*) >90 (mL/min)   GFR calc Af Amer 80 (*) >90 (mL/min)    Intake/Output Summary (Last 24 hours) at 04/21/11 0717 Last data filed at 04/21/11 0600  Gross per 24 hour  Intake    599 ml  Output   1450 ml  Net   -851 ml    EKG:  NSR, rate 80.  Deep T wave inversion in the anterior and lateral leads.  04/21/2011  ASSESSMENT AND PLAN:  1)  CAD:   Chest pain again this am.  I have reviewed the films with Dr. Excell Seltzer.  Given the ongoing pain with hypotension we believe that the better treatment is PCI of the LAD.  I have discussed this with the patient and her daughter and they understand and agree to proceed.  2)  Hyperlipidemia:  Statin started.    3)  Ischemic cardiomyopathy:  Hypotension does not allow med titration.  Fayrene Fearing [facility] 04/21/2011 7:17 AM

## 2011-04-22 ENCOUNTER — Encounter (HOSPITAL_COMMUNITY): Payer: Self-pay | Admitting: *Deleted

## 2011-04-22 DIAGNOSIS — I517 Cardiomegaly: Secondary | ICD-10-CM

## 2011-04-22 LAB — BASIC METABOLIC PANEL
CO2: 22 mEq/L (ref 19–32)
Calcium: 8.9 mg/dL (ref 8.4–10.5)
GFR calc non Af Amer: 66 mL/min — ABNORMAL LOW (ref >90)
Potassium: 3.5 mEq/L (ref 3.5–5.1)
Sodium: 135 mEq/L (ref 135–145)

## 2011-04-22 LAB — CBC
MCH: 31 pg (ref 26.0–34.0)
Platelets: 148 10*3/uL — ABNORMAL LOW (ref 150–400)
RBC: 3.81 MIL/uL — ABNORMAL LOW (ref 3.87–5.11)

## 2011-04-22 MED ORDER — LISINOPRIL 2.5 MG PO TABS
2.5000 mg | ORAL_TABLET | Freq: Every day | ORAL | Status: DC
  Administered 2011-04-22 – 2011-04-23 (×2): 2.5 mg via ORAL
  Filled 2011-04-22 (×3): qty 1

## 2011-04-22 MED FILL — Heparin Sodium (Porcine) 100 Unt/ML in Sodium Chloride 0.45%: INTRAMUSCULAR | Qty: 250 | Status: AC

## 2011-04-22 MED FILL — Dextrose Inj 5%: INTRAVENOUS | Qty: 50 | Status: AC

## 2011-04-22 NOTE — Progress Notes (Signed)
CARDIAC REHAB PHASE I   PRE:  Rate/Rhythm: 74 SR    BP: sitting 114/74    SaO2: 97 RA  MODE:  Ambulation: 450 ft   POST:  Rate/Rhythm: 88    BP: sitting 140/70     SaO2: 98 RA  Tolerated fairlly well, slight unsteadiness. Sts she didn't use cane PTA. Some slight SOB noted. No specific c/o. Ed completed with family. Requests her [facility] be [facility] to [facility]. [phone]  Joann Hunter CES, ACSM

## 2011-04-22 NOTE — Progress Notes (Signed)
  Echocardiogram 2D Echocardiogram has been performed.  Joann Hunter [facility] 04/22/2011, 12:10 PM

## 2011-04-22 NOTE — Progress Notes (Signed)
SUBJECTIVE:  No further chest pain.  No SOB   PHYSICAL EXAM Filed Vitals:   04/21/11 1950 04/21/11 2000 04/21/11 2317 04/22/11 0305  BP: 139/50  97/48 127/80  Pulse:      Temp:  99.3 F (37.4 C) 98.2 F (36.8 C) 98.1 F (36.7 C)  TempSrc:  Oral Oral Oral  Resp: 22  21 25   Height:      Weight:      SpO2:  93% 96% 95%   General:  No distress Lungs:  Clear Heart:  No murmur, no rubs Abdomen:  Positive bowel sounds, no rebound no guarding Extremities:  Right radial cath site with minimal bruising    LABS: Lab Results  Component Value Date   CKTOTAL 66 04/21/2011   CKMB 5.4* 04/21/2011   TROPONINI 4.68* 04/21/2011   Results for orders placed during the hospital encounter of 04/20/11 (from the past 24 hour(s))  POCT ACTIVATED CLOTTING TIME     Status: Normal   Collection Time   04/21/11  9:11 AM      Component Value Range   Activated Clotting Time 430    CARDIAC PANEL(CRET KIN+CKTOT+MB+TROPI)     Status: Abnormal   Collection Time   04/21/11 10:00 AM      Component Value Range   Total CK 105  7 - 177 (U/L)   CK, MB 11.2 (*) 0.3 - 4.0 (ng/mL)   Troponin I 4.79 (*) <0.30 (ng/mL)   Relative Index 10.7 (*) 0.0 - 2.5   CBC     Status: Normal   Collection Time   04/21/11 11:52 AM      Component Value Range   WBC 7.1  4.0 - 10.5 (K/uL)   RBC 4.01  3.87 - 5.11 (MIL/uL)   Hemoglobin 12.6  12.0 - 15.0 (g/dL)   HCT 16.1  09.6 - 04.5 (%)   MCV 90.3  78.0 - 100.0 (fL)   MCH 31.4  26.0 - 34.0 (pg)   MCHC 34.8  30.0 - 36.0 (g/dL)   RDW 40.9  81.1 - 91.4 (%)   Platelets 151  150 - 400 (K/uL)  CREATININE, SERUM     Status: Abnormal   Collection Time   04/21/11 11:52 AM      Component Value Range   Creatinine, Ser 0.79  0.50 - 1.10 (mg/dL)   GFR calc non Af Amer 78 (*) >90 (mL/min)   GFR calc Af Amer >90  >90 (mL/min)  CARDIAC PANEL(CRET KIN+CKTOT+MB+TROPI)     Status: Abnormal   Collection Time   04/21/11  4:44 PM      Component Value Range   Total CK 77  7 - 177 (U/L)     CK, MB 6.7 (*) 0.3 - 4.0 (ng/mL)   Troponin I 4.20 (*) <0.30 (ng/mL)   Relative Index RELATIVE INDEX IS INVALID  0.0 - 2.5   CARDIAC PANEL(CRET KIN+CKTOT+MB+TROPI)     Status: Abnormal   Collection Time   04/21/11  9:23 PM      Component Value Range   Total CK 66  7 - 177 (U/L)   CK, MB 5.4 (*) 0.3 - 4.0 (ng/mL)   Troponin I 4.68 (*) <0.30 (ng/mL)   Relative Index RELATIVE INDEX IS INVALID  0.0 - 2.5     Intake/Output Summary (Last 24 hours) at 04/22/11 0620 Last data filed at 04/21/11 1800  Gross per 24 hour  Intake 1260.5 ml  Output    950 ml  Net  310.5 ml    ASSESSMENT AND PLAN:  1)  CAD:   Post PCI to LAD.  Transfer to floor today and get cardiac rehab.  Home probably in the am.   2)  Hyperlipidemia:  Statin started this admission.    3)  Ischemic cardiomyopathy:  Start low dose ACE today.  Fayrene Fearing [facility] 04/22/2011 6:20 AM

## 2011-04-23 ENCOUNTER — Encounter (HOSPITAL_COMMUNITY): Payer: Self-pay | Admitting: Physician Assistant

## 2011-04-23 DIAGNOSIS — I2510 Atherosclerotic heart disease of native coronary artery without angina pectoris: Secondary | ICD-10-CM | POA: Diagnosis present

## 2011-04-23 DIAGNOSIS — E785 Hyperlipidemia, unspecified: Secondary | ICD-10-CM | POA: Diagnosis present

## 2011-04-23 DIAGNOSIS — I2109 ST elevation (STEMI) myocardial infarction involving other coronary artery of anterior wall: Secondary | ICD-10-CM

## 2011-04-23 DIAGNOSIS — I255 Ischemic cardiomyopathy: Secondary | ICD-10-CM | POA: Diagnosis present

## 2011-04-23 LAB — BASIC METABOLIC PANEL
BUN: 15 mg/dL (ref 6–23)
Creatinine, Ser: 0.86 mg/dL (ref 0.50–1.10)
GFR calc Af Amer: 74 mL/min — ABNORMAL LOW (ref >90)
GFR calc non Af Amer: 64 mL/min — ABNORMAL LOW (ref >90)
Potassium: 3.6 mEq/L (ref 3.5–5.1)

## 2011-04-23 MED ORDER — NITROGLYCERIN 0.4 MG SL SUBL: 0.4000 mg | SUBLINGUAL_TABLET | SUBLINGUAL | Status: DC | PRN

## 2011-04-23 MED ORDER — ATORVASTATIN CALCIUM 80 MG PO TABS: 80.0000 mg | ORAL_TABLET | Freq: Every day | ORAL | Status: DC

## 2011-04-23 MED ORDER — METOPROLOL TARTRATE 25 MG PO TABS: 12.5000 mg | ORAL_TABLET | Freq: Two times a day (BID) | ORAL | Status: DC

## 2011-04-23 MED ORDER — TICAGRELOR 90 MG PO TABS: 90.0000 mg | ORAL_TABLET | Freq: Two times a day (BID) | ORAL | Status: DC

## 2011-04-23 MED ORDER — LISINOPRIL 2.5 MG PO TABS: 2.5000 mg | ORAL_TABLET | Freq: Every day | ORAL | Status: DC

## 2011-04-23 NOTE — Progress Notes (Signed)
Patient ID: Joann Hunter, female   DOB: [birth year], 78 y.o.   MRN: [id number]   SUBJECTIVE: The patient is stable today.She has ambulated without any significant difficulty.   Filed Vitals:   04/22/11 1500 04/22/11 2033 04/23/11 0530 04/23/11 0956  BP: 114/74 112/65 133/72 112/65  Pulse: 74 83 76 67  Temp:  98.3 F (36.8 C) 98.1 F (36.7 C)   TempSrc:  Oral Oral   Resp: 18 18 20    Height:      Weight:      SpO2: 97% 95% 97%     Intake/Output Summary (Last 24 hours) at 04/23/11 1039 Last data filed at 04/23/11 0830  Gross per 24 hour  Intake    960 ml  Output      0 ml  Net    960 ml    LABS: Basic Metabolic Panel:  Basename 04/23/11 0700 04/22/11 0535 04/20/11 1558  NA 136 135 --  K 3.6 3.5 --  CL 105 103 --  CO2 21 22 --  GLUCOSE 120* 119* --  BUN 15 11 --  CREATININE 0.86 0.83 --  CALCIUM 8.9 8.9 --  MG -- -- 2.0  PHOS -- -- --   Liver Function Tests:  Basename 04/21/11 0345  AST 40*  ALT 10  ALKPHOS 62  BILITOT 0.5  PROT 6.1  ALBUMIN 3.2*   No results found for this basename: LIPASE:2,AMYLASE:2 in the last 72 hours CBC:  Basename 04/22/11 0535 04/21/11 1152  WBC 5.2 7.1  NEUTROABS -- --  HGB 11.8* 12.6  HCT 34.2* 36.2  MCV 89.8 90.3  PLT 148* 151   Cardiac Enzymes:  Basename 04/21/11 2123 04/21/11 1644 04/21/11 1000  CKTOTAL 66 77 105  CKMB 5.4* 6.7* 11.2*  CKMBINDEX -- -- --  TROPONINI 4.68* 4.20* 4.79*   BNP: No components found with this basename: POCBNP:3 D-Dimer: No results found for this basename: DDIMER:2 in the last 72 hours Hemoglobin A1C:  Basename 04/20/11 1558  HGBA1C 5.5   Fasting Lipid Panel:  Basename 04/21/11 0345  CHOL 205*  HDL 37*  LDLCALC 149*  TRIG 95  CHOLHDL 5.5  LDLDIRECT --   Thyroid Function Tests:  Basename 04/20/11 1558  TSH 0.911  T4TOTAL --  T3FREE --  THYROIDAB --    RADIOLOGY: No results found.  PHYSICAL EXAM  Patient is oriented to person time and place. Affect is normal.  Family is in the room. Lungs reveal a few scattered rhonchi. Cardiac exam reveals S1 and S2. There no clicks or significant murmurs. There is no significant peripheral edema.   TELEMETRY: I have personally reviewed telemetry. There is sinus rhythm.   ASSESSMENT AND PLAN:   *Myocardial infarction, anterior wall, initial care    Patient is recovering well and is ready to go home.   CAD (coronary artery disease   Cardiomyopathy   Very low dose ACE inhibitor was started yesterday. She is stable with this so far.   Dyslipidemia  Plan will be for discharge today.   Willa Rough 04/23/2011 10:39 AM

## 2011-04-23 NOTE — Discharge Summary (Signed)
Discharge Summary   Patient ID: Joann Hunter MRN: [id number], DOB/AGE: May 31, 1932 78 y.o. Admit date: 04/20/2011 D/C date:     04/23/2011   Primary Discharge Diagnoses:  1. NSTEMI with newly diagnosed 3V CAD - s/p PCI/DES to mid LAD 04/11/11 - per Dr. Excell Seltzer, should have consideration of PCI of the occluded RCA based on symptoms and/or consideration of outpatient nuclear perfusion testing after the patient recovers from her infarct 2. Ischemic cardiomyopathy with with EF 45% by cath 04/20/11, 50-55% by echo 04/22/11 - hypotension limiting medication titration 3. Hyperlipidemia diagnosed this admission - will need f/u lipids/LFTs in 6 weeks 4. Hyperglycemia  Secondary Discharge Diagnoses:  1. Hypothyroidism  Hospital Course: 78 y/o F w/ PMHx significant for hypothyroidism and no prior cardiac history who presented to her PCP with c/o chest pain for which she was sent to [facility] where she was found to have an elevated troponin and then transferred to [facility] on 04/20/2011. She reported being in her usual state of health until about 3 weeks ago when she noted sob and substernal chest pressure while working in her yard lasting 5-10 mins and was relieved with rest.  Since that time she reported occasional episodes of exertional CP and dyspnea. On morning of admission she was awakened by CP, SOB and diaphoresis not relieved with rest so received ASA at PCP and was sent to the ER. In the [facility] ED, EKG revealed NSR 84bpm, old anterior infarct, poor R wave progression. CXR showed mild interstitial edema. Labs Na+ 137, K+ 3.9, BUN/Crt 16/0.86, GFR >60, glucose 168, troponin 0.41, CKMB 7.1, total CK 90, WBC 5.2, H&H 13.6/42, plt 171. She was started on IV heparin and transferred to [facility] stepdown unit for further evaluation and treatment. EKG at [facility] showed TWI I, aVL, V1-V6 & she was chest pain free. She was taken to the cath lab that day demonstrating severe triple vessel CAD,  segmental LV systolic dysfunction with EF 45% with hypokinesis of the anterior wall, apex and inferoapical wall. The initial plan was for CVTS consult, however, cath films were reviewed by Dr. Antoine Poche and Dr. Excell Seltzer and given ongoing CP and hypotension it was recommended she undergo PCI of the LAD. She was taken back to the cath lab 4/18 and had successful 45% with hypokinesis of the anterior wall, apex and inferoapical wall. She was treated with ASA and Brilinta with recommendation to continue at least 12 months. Consideration of PCI of the occluded RCA based on symptoms and/or consideration of outpatient nuclear perfusion testing after the patient recovers from her infarct. She tolerated this procedure well and activity was progressed. 2D echo was obtained showing improved EF of 50-55% 04/22/11. The patient was seen and examined today and felt stable for discharge by Dr. Myrtis Ser. Of note, her Hgb dropped slightly which is not uncommon post-cath & post hydration but she was instructed to f/u with PCP for general medical maintenance. Her CBG also was mildly elevated and she will f/u PCP for that as well. The patient would like to follow up in the [place] office.   Discharge Vitals: Blood pressure 112/65, pulse 67, temperature 98.1 F (36.7 C), temperature source Oral, resp. rate 20, height 5\' 2"  (1.575 m), weight 152 lb (68.947 kg), SpO2 97.00%.  Labs: Lab Results  Component Value Date   WBC 5.2 04/22/2011   HGB 11.8* 04/22/2011   HCT 34.2* 04/22/2011   MCV 89.8 04/22/2011   PLT 148* 04/22/2011  Lab 04/23/11 0700 04/21/11 0345  NA 136 --  K 3.6 --  CL 105 --  CO2 21 --  BUN 15 --  CREATININE 0.86 --  CALCIUM 8.9 --  PROT -- 6.1  BILITOT -- 0.5  ALKPHOS -- 62  ALT -- 10  AST -- 40*  GLUCOSE 120* --    Basename 04/21/11 2123 04/21/11 1644 04/21/11 1000 04/21/11 0345  CKTOTAL 66 77 105 161  CKMB 5.4* 6.7* 11.2* 17.4*  TROPONINI 4.68* 4.20* 4.79* 9.98*   Lab Results  Component Value Date    CHOL 205* 04/21/2011   HDL 37* 04/21/2011   LDLCALC 149* 04/21/2011   TRIG 95 04/21/2011    Diagnostic Studies/Procedures   1. 2D Echo Study Conclusions 04/22/11 - Left ventricle: Distal septal and apical hypokinesis The cavity size was normal. Wall thickness was increased in a pattern of moderate LVH. Systolic function was normal. The estimated ejection fraction was in the range of 50% to 55%. - Atrial septum: No defect or patent foramen ovale was identified.  2. Cardiac catheterization this admission, please see full report and above for summary.   Discharge Medications   Medication List  As of 04/23/2011 11:17 AM   TAKE these medications         aspirin 81 MG chewable tablet   Chew 81 mg by mouth daily.      atorvastatin 80 MG tablet   Commonly known as: LIPITOR   Take 1 tablet (80 mg total) by mouth at bedtime.      fish oil-omega-3 fatty acids 1000 MG capsule   Take 1 g by mouth daily.      levothyroxine 100 MCG tablet   Commonly known as: SYNTHROID, LEVOTHROID   Take 100 mcg by mouth daily.      lisinopril 2.5 MG tablet   Commonly known as: PRINIVIL,ZESTRIL   Take 1 tablet (2.5 mg total) by mouth daily.      metoprolol tartrate 25 MG tablet   Commonly known as: LOPRESSOR   Take 0.5 tablets (12.5 mg total) by mouth 2 (two) times daily.      nitroGLYCERIN 0.4 MG SL tablet   Commonly known as: NITROSTAT   Place 1 tablet (0.4 mg total) under the tongue every 5 (five) minutes x 3 doses as needed for chest pain.      Ticagrelor 90 MG Tabs tablet   Commonly known as: BRILINTA   Take 1 tablet (90 mg total) by mouth 2 (two) times daily.          Pt was given both a 30-day RX for Brilinta for the free card and a 30 day RX with 10 additional refills (to fulfill 1 year supply)   Disposition   The patient will be discharged in stable condition to home. Discharge Orders    Future Orders Please Complete By Expires   Amb Referral to Cardiac Rehabilitation      Comments:     To [facility] Phase 2   Diet - low sodium heart healthy      Increase activity slowly      Comments:   No driving for 1 week. No lifting over 10 lbs for 2 weeks. No sexual activity for 2 weeks. Keep procedure site clean & dry. If you notice increased pain, swelling, bleeding or pus, call/return!  You may shower, but no soaking baths/hot tubs/pools for 1 week.      Discharge instructions      Comments:   Continue to  follow with primary doctor for routine monitoring. Your CBC (blood count) may need to be rechecked as your hemoglobin dipped slightly, but this is common in patients who undergo procedures and get IV hydration. Your blood sugar was also mildly elevated and needs to be monitored as an outpatient.     Follow-up Information    Follow up with [facility]. (Our office will call you for an appointment)    Contact information:   [address] [place] [address] [phone]       Follow up with Primary Care Doctor . (See discharge instructions section)           Duration of Discharge Encounter: Greater than 30 minutes including physician and PA time.  Signed, Ronie Spies PA-C 04/23/2011, 11:17 AM  Patient seen and examined. I agree with the assessment and plan as detailed above. See also my additional thoughts below.   See my progress note from today also.  Willa Rough, MD, [facility] 04/23/2011 12:41 PM

## 2011-04-25 ENCOUNTER — Emergency Department (HOSPITAL_COMMUNITY): Payer: Medicare Other

## 2011-04-25 ENCOUNTER — Observation Stay (HOSPITAL_COMMUNITY)
Admission: EM | Admit: 2011-04-25 | Discharge: 2011-04-26 | Disposition: A | Discharge status: Home / Self Care | Payer: Medicare Other | Source: Ambulatory Visit | Attending: Cardiology | Admitting: Cardiology

## 2011-04-25 DIAGNOSIS — R079 Chest pain, unspecified: Principal | ICD-10-CM | POA: Insufficient documentation

## 2011-04-25 DIAGNOSIS — I2510 Atherosclerotic heart disease of native coronary artery without angina pectoris: Secondary | ICD-10-CM | POA: Insufficient documentation

## 2011-04-25 DIAGNOSIS — E039 Hypothyroidism, unspecified: Secondary | ICD-10-CM | POA: Insufficient documentation

## 2011-04-25 DIAGNOSIS — I255 Ischemic cardiomyopathy: Secondary | ICD-10-CM | POA: Diagnosis present

## 2011-04-25 DIAGNOSIS — R61 Generalized hyperhidrosis: Secondary | ICD-10-CM | POA: Insufficient documentation

## 2011-04-25 DIAGNOSIS — E785 Hyperlipidemia, unspecified: Secondary | ICD-10-CM | POA: Insufficient documentation

## 2011-04-25 DIAGNOSIS — Z9861 Coronary angioplasty status: Secondary | ICD-10-CM | POA: Insufficient documentation

## 2011-04-25 DIAGNOSIS — I2589 Other forms of chronic ischemic heart disease: Secondary | ICD-10-CM | POA: Insufficient documentation

## 2011-04-25 DIAGNOSIS — R55 Syncope and collapse: Secondary | ICD-10-CM | POA: Insufficient documentation

## 2011-04-25 LAB — POCT I-STAT, CHEM 8
HCT: 36 % (ref 36.0–46.0)
Hemoglobin: 12.2 g/dL (ref 12.0–15.0)
Potassium: 4.2 mEq/L (ref 3.5–5.1)
Sodium: 134 mEq/L — ABNORMAL LOW (ref 135–145)

## 2011-04-25 LAB — DIFFERENTIAL
Basophils Absolute: 0 10*3/uL (ref 0.0–0.1)
Basophils Relative: 0 % (ref 0–1)
Eosinophils Absolute: 0.1 10*3/uL (ref 0.0–0.7)
Eosinophils Relative: 2 % (ref 0–5)
Monocytes Absolute: 0.9 10*3/uL (ref 0.1–1.0)

## 2011-04-25 LAB — CBC
HCT: 34.8 % — ABNORMAL LOW (ref 36.0–46.0)
MCHC: 35.3 g/dL (ref 30.0–36.0)
MCV: 88.8 fL (ref 78.0–100.0)
RDW: 13.1 % (ref 11.5–15.5)

## 2011-04-25 MED ORDER — NITROGLYCERIN 2 % TD OINT
1.0000 [in_us] | TOPICAL_OINTMENT | Freq: Once | TRANSDERMAL | Status: AC
  Administered 2011-04-25: 1 [in_us] via TOPICAL
  Filled 2011-04-25: qty 1

## 2011-04-25 MED ORDER — SODIUM CHLORIDE 0.9 % IV SOLN
INTRAVENOUS | Status: DC
  Administered 2011-04-25: via INTRAVENOUS

## 2011-04-25 MED ORDER — ASPIRIN 81 MG PO CHEW
324.0000 mg | CHEWABLE_TABLET | Freq: Once | ORAL | Status: AC
  Administered 2011-04-25: 324 mg via ORAL
  Filled 2011-04-25: qty 4

## 2011-04-25 NOTE — ED Provider Notes (Signed)
History     CSN: [id number]  Arrival date & time 04/25/11  2256   First MD Initiated Contact with Patient 04/25/11 2315      Chief Complaint  Patient presents with  . Loss of Consciousness  . Chest Pain    (Consider location/radiation/quality/duration/timing/severity/associated sxs/prior treatment) HPI  Patient relates on April 19 she had a central chest pressure with diaphoresis and was diagnosed with a non-STEMI. She had a stent placed by Dr. Excell Seltzer. She relates yesterday she had diarrhea about 4 times that was very watery. She had abdominal cramping before the diarrhea but otherwise no significant abdominal pain. She denies nausea or vomiting. She states today the diarrhea was gone. She relates today she ate well for the first time since she had her stent placed. Her family report about 9:30 PM she was seen taking a nitroglycerin sublingual tablet. She relates she had a soreness in her chest and points to the substernal region. Daughter states shortly after taking the nitroglycerin she slumped over, she became diaphoretic and pale, and patient was just staring with deep short breathing pattern. She was not responding verbally. Her daughter called 911 and shortly after she hung up patient started to respond and did not remember what happened. Her daughter estimates she was unconscious for 2-3 minutes. Patient reports her chest soreness is now gone.  PCP Samuel Jester in [facility] Cardiologist Dr. Excell Seltzer with [place]   Past Medical History  Diagnosis Date  . Hypothyroidism   . CAD (coronary artery disease)     NSTEMI 04/2011 with newly diagnosed 3V CAD s/p PCI/DES mLAD 04/11/11 with residual dz (per Dr. Excell Seltzer, should have consideration of PCI of the occluded RCA based on symptoms and/or consideration of outpatient nuclear perfusion testing after the patient recovers from her infarct)  . Ischemic cardiomyopathy     EF 45% by cath 04/20/11, 50-55% by echo 04/22/11. hypotension limiting  medication titration   . Hyperlipidemia   . Hyperglycemia     Past Surgical History  Procedure Date  . Breast lumpectomy     right  . Coronary stent placement     Family History  Problem Relation Age of Onset  . Other      no known family cardiac disease    History  Substance Use Topics  . Smoking status: Never Smoker   . Smokeless tobacco: Never Used  . Alcohol Use: No  lives with husband lives at home  OB History    Grav Para Term Preterm Abortions TAB SAB Ect Mult Living                  Review of Systems  All other systems reviewed and are negative.    Allergies  Penicillins  Home Medications   Current Outpatient Rx  Name Route Sig Dispense Refill  . ASPIRIN 81 MG PO CHEW Oral Chew 81 mg by mouth daily.    . ATORVASTATIN CALCIUM 80 MG PO TABS Oral Take 80 mg by mouth at bedtime.    . OMEGA-3 FATTY ACIDS 1000 MG PO CAPS Oral Take 1 g by mouth daily.    Marland Kitchen LEVOTHYROXINE SODIUM 100 MCG PO TABS Oral Take 100 mcg by mouth daily.    Marland Kitchen LISINOPRIL 2.5 MG PO TABS Oral Take 2.5 mg by mouth daily.    Marland Kitchen METOPROLOL TARTRATE 25 MG PO TABS Oral Take 12.5 mg by mouth 2 (two) times daily.    Marland Kitchen NITROGLYCERIN 0.4 MG SL SUBL Sublingual Place 0.4 mg under  the tongue every 5 (five) minutes x 3 doses as needed. For chest pain.    Marland Kitchen TICAGRELOR 90 MG PO TABS Oral Take 90 mg by mouth 2 (two) times daily.      BP 126/103  Pulse 78  Temp(Src) 97.8 F (36.6 C) (Oral)  Resp 20  SpO2 97%  Vital signs normal except diastolic hypertension   Physical Exam  Nursing note and vitals reviewed. Constitutional: She is oriented to person, place, and time. She appears well-developed and well-nourished.  Non-toxic appearance. She does not appear ill. No distress.  HENT:  Head: Normocephalic and atraumatic.  Right Ear: External ear normal.  Left Ear: External ear normal.  Nose: Nose normal. No mucosal edema or rhinorrhea.  Mouth/Throat: Mucous membranes are normal. No dental abscesses  or uvula swelling.       Tongue dry  Eyes: Conjunctivae and EOM are normal. Pupils are equal, round, and reactive to light.  Neck: Normal range of motion and full passive range of motion without pain. Neck supple.  Cardiovascular: Normal rate, regular rhythm and normal heart sounds.  Exam reveals no gallop and no friction rub.   No murmur heard. Pulmonary/Chest: Effort normal and breath sounds normal. No respiratory distress. She has no wheezes. She has no rhonchi. She has no rales. She exhibits no tenderness and no crepitus.  Abdominal: Soft. Normal appearance and bowel sounds are normal. She exhibits no distension. There is no tenderness. There is no rebound and no guarding.  Musculoskeletal: Normal range of motion. She exhibits no edema and no tenderness.       Moves all extremities well.   Neurological: She is alert and oriented to person, place, and time. She has normal strength. No cranial nerve deficit.  Skin: Skin is warm, dry and intact. No rash noted. No erythema. No pallor.  Psychiatric: She has a normal mood and affect. Her speech is normal and behavior is normal. Her mood appears not anxious.    ED Course  Procedures (including critical care time)  Review of cardiac catheterization done on April 18 showed she had a stent placed in the mid LAD, with recommendation to consider PCI of her occluded RCA after getting a nuclear stress test after she recovers from her MI.  Medications  0.9 %  sodium chloride infusion (  Intravenous New Bag/Given 04/25/11 2340)  aspirin chewable tablet 324 mg (324 mg Oral Given 04/25/11 2340)  nitroGLYCERIN (NITROGLYN) 2 % ointment 1 inch (1 inch Topical Given 04/25/11 2340)    00:28 Dr Lurline Idol, on call for [facility] will get patient admitted.   Results for orders placed during the hospital encounter of 04/25/11  APTT      Component Value Range   aPTT 38 (*) 24 - 37 (seconds)  PROTIME-INR      Component Value Range   Prothrombin Time 14.2   11.6 - 15.2 (seconds)   INR 1.08  0.00 - 1.49   CBC      Component Value Range   WBC 5.1  4.0 - 10.5 (K/uL)   RBC 3.92  3.87 - 5.11 (MIL/uL)   Hemoglobin 12.3  12.0 - 15.0 (g/dL)   HCT 19.1 (*) 47.8 - 46.0 (%)   MCV 88.8  78.0 - 100.0 (fL)   MCH 31.4  26.0 - 34.0 (pg)   MCHC 35.3  30.0 - 36.0 (g/dL)   RDW 29.5  62.1 - 30.8 (%)   Platelets 200  150 - 400 (K/uL)  DIFFERENTIAL  Component Value Range   Neutrophils Relative 63  43 - 77 (%)   Neutro Abs 3.2  1.7 - 7.7 (K/uL)   Lymphocytes Relative 17  12 - 46 (%)   Lymphs Abs 0.9  0.7 - 4.0 (K/uL)   Monocytes Relative 17 (*) 3 - 12 (%)   Monocytes Absolute 0.9  0.1 - 1.0 (K/uL)   Eosinophils Relative 2  0 - 5 (%)   Eosinophils Absolute 0.1  0.0 - 0.7 (K/uL)   Basophils Relative 0  0 - 1 (%)   Basophils Absolute 0.0  0.0 - 0.1 (K/uL)  TROPONIN I      Component Value Range   Troponin I <0.30  <0.30 (ng/mL)  POCT I-STAT, CHEM 8      Component Value Range   Sodium 134 (*) 135 - 145 (mEq/L)   Potassium 4.2  3.5 - 5.1 (mEq/L)   Chloride 105  96 - 112 (mEq/L)   BUN 24 (*) 6 - 23 (mg/dL)   Creatinine, Ser 4.54  0.50 - 1.10 (mg/dL)   Glucose, Bld 098 (*) 70 - 99 (mg/dL)   Calcium, Ion 1.19  1.47 - 1.32 (mmol/L)   TCO2 21  0 - 100 (mmol/L)   Hemoglobin 12.2  12.0 - 15.0 (g/dL)   HCT 82.9  56.2 - 13.0 (%)    Laboratory interpretation all normal except mild hyperglycemia  Dg Chest Port 1 View  04/25/2011  *RADIOLOGY REPORT*  Clinical Data: Syncope, chest discomfort  PORTABLE CHEST - 1 VIEW  Comparison: None  Findings: Cardiomediastinal silhouette is within normal limits. The lungs are clear. No pleural effusion.  No pneumothorax.  No acute osseous abnormality. Right breast clips are noted.  IMPRESSION: No acute cardiopulmonary process.  Original Report Authenticated By: Harrel Lemon, M.D.     Date: 04/25/2011  Rate: 69  Rhythm: normal sinus rhythm  QRS Axis: normal  Intervals: normal  ST/T Wave abnormalities: inverted T  waves anterolateral leads  Conduction Disutrbances:none  Narrative Interpretation: BAE  Old EKG Reviewed: changes noted from 04/22/2011 less prominent T wave inversions    1. Chest pain   2. Syncope    Plan admission  Devoria Albe, MD, FACEP  CRITICAL CARE Performed by: Devoria Albe L   Total critical care time: 32 minutes  Critical care time was exclusive of separately billable procedures and treating other patients.  Critical care was necessary to treat or prevent imminent or life-threatening deterioration.  Critical care was time spent personally by me on the following activities: development of treatment plan with patient and/or surrogate as well as nursing, discussions with consultants, evaluation of patient's response to treatment, examination of patient, obtaining history from patient or surrogate, ordering and performing treatments and interventions, ordering and review of laboratory studies, ordering and review of radiographic studies, pulse oximetry and re-evaluation of patient's condition.   MDM          Ward Givens, MD 04/26/11 [phone]

## 2011-04-25 NOTE — ED Notes (Signed)
Per EMS, pt from home, last week pt had MI and had stent placed on Wednesday and d/c'ed on Saturday, when admitted pt was placed on multiple new meds; pt reports having soreness in chest tonight and reports it was not like MI; pt took all night meds- lopressor, lisinpril; and then pt took nitro and had syncopal episode, daughter was calling her name and pt was not responding; when EMS arrived, pt a&Ox4; pt reports being pain free, but now c/o feeling weak, able to stand up and sit on stretcher; pt on 2L [place], RA sat 91%; pts BP sys 100-120, HR 70-80s; 20g LAC

## 2011-04-26 ENCOUNTER — Encounter (HOSPITAL_COMMUNITY): Payer: Self-pay | Admitting: Emergency Medicine

## 2011-04-26 DIAGNOSIS — R079 Chest pain, unspecified: Secondary | ICD-10-CM

## 2011-04-26 DIAGNOSIS — R55 Syncope and collapse: Secondary | ICD-10-CM

## 2011-04-26 LAB — BASIC METABOLIC PANEL
CO2: 20 mEq/L (ref 19–32)
Calcium: 9.2 mg/dL (ref 8.4–10.5)
Chloride: 99 mEq/L (ref 96–112)
GFR calc Af Amer: 60 mL/min — ABNORMAL LOW (ref >90)
Sodium: 130 mEq/L — ABNORMAL LOW (ref 135–145)

## 2011-04-26 LAB — CBC
MCV: 88.4 fL (ref 78.0–100.0)
Platelets: 208 10*3/uL (ref 150–400)
RBC: 4.06 MIL/uL (ref 3.87–5.11)
WBC: 5.8 10*3/uL (ref 4.0–10.5)

## 2011-04-26 LAB — CARDIAC PANEL(CRET KIN+CKTOT+MB+TROPI)
CK, MB: 6.9 ng/mL (ref 0.3–4.0)
CK, MB: 7 ng/mL (ref 0.3–4.0)
Relative Index: INVALID (ref 0.0–2.5)
Relative Index: INVALID (ref 0.0–2.5)
Total CK: 64 U/L (ref 7–177)
Total CK: 70 U/L (ref 7–177)
Total CK: 65 U/L (ref 7–177)
Troponin I: <0.3 ng/mL (ref <0.30)
Troponin I: <0.3 ng/mL (ref <0.30)

## 2011-04-26 LAB — PROTIME-INR: INR: 1.08 (ref 0.00–1.49)

## 2011-04-26 MED ORDER — ASPIRIN 81 MG PO CHEW
81.0000 mg | CHEWABLE_TABLET | Freq: Every day | ORAL | Status: DC
  Administered 2011-04-26: 81 mg via ORAL
  Filled 2011-04-26: qty 1

## 2011-04-26 MED ORDER — HEPARIN SODIUM (PORCINE) 5000 UNIT/ML IJ SOLN
5000.0000 [IU] | Freq: Three times a day (TID) | INTRAMUSCULAR | Status: DC
  Administered 2011-04-26 (×2): 5000 [IU] via SUBCUTANEOUS
  Filled 2011-04-26 (×4): qty 1

## 2011-04-26 MED ORDER — NITROGLYCERIN 0.4 MG SL SUBL: 0.4000 mg | SUBLINGUAL_TABLET | SUBLINGUAL | Status: DC | PRN

## 2011-04-26 MED ORDER — LISINOPRIL 2.5 MG PO TABS
2.5000 mg | ORAL_TABLET | Freq: Every day | ORAL | Status: DC
  Administered 2011-04-26: 2.5 mg via ORAL
  Filled 2011-04-26: qty 1

## 2011-04-26 MED ORDER — ONDANSETRON HCL 4 MG/2ML IJ SOLN: 4.0000 mg | Freq: Four times a day (QID) | INTRAMUSCULAR | Status: DC | PRN

## 2011-04-26 MED ORDER — ATORVASTATIN CALCIUM 80 MG PO TABS
80.0000 mg | ORAL_TABLET | Freq: Every day | ORAL | Status: DC
  Filled 2011-04-26: qty 1

## 2011-04-26 MED ORDER — METOPROLOL TARTRATE 12.5 MG HALF TABLET
12.5000 mg | ORAL_TABLET | Freq: Two times a day (BID) | ORAL | Status: DC
  Administered 2011-04-26: 12.5 mg via ORAL
  Filled 2011-04-26 (×2): qty 1

## 2011-04-26 MED ORDER — ACETAMINOPHEN 325 MG PO TABS: 650.0000 mg | ORAL_TABLET | ORAL | Status: DC | PRN

## 2011-04-26 MED ORDER — TICAGRELOR 90 MG PO TABS
90.0000 mg | ORAL_TABLET | Freq: Two times a day (BID) | ORAL | Status: DC
  Administered 2011-04-26: 90 mg via ORAL
  Filled 2011-04-26 (×2): qty 1

## 2011-04-26 MED ORDER — LEVOTHYROXINE SODIUM 100 MCG PO TABS
100.0000 ug | ORAL_TABLET | Freq: Every day | ORAL | Status: DC
  Administered 2011-04-26: 100 ug via ORAL
  Filled 2011-04-26 (×2): qty 1

## 2011-04-26 MED FILL — Nicardipine HCl IV Soln 2.5 MG/ML: INTRAVENOUS | Qty: 1 | Status: AC

## 2011-04-26 NOTE — Progress Notes (Signed)
Utilization review complete 

## 2011-04-26 NOTE — Discharge Summary (Signed)
CARDIOLOGY DISCHARGE SUMMARY   Patient ID: Joann Hunter MRN: [id number] DOB/AGE: Sep 10, 1932 78 y.o.  Admit date: 04/25/2011 Discharge date: 04/26/2011  Primary Discharge Diagnosis:  Principal Problem:  *Chest pain at rest  Secondary Discharge Diagnosis:     Diagnosis Date  . Hypothyroidism     Syncope, near   . CAD (coronary artery disease)     NSTEMI 04/2011 with newly diagnosed 3V CAD s/p PCI with a 2.5 x 20 mm Promus Element DES mLAD 04/11/11 with residual dz (per Dr. Excell Seltzer, should have consideration of PCI of the occluded RCA based on symptoms and/or consideration of outpatient nuclear perfusion testing after the patient recovers from her infarct)  . Ischemic cardiomyopathy     EF 45% by cath 04/20/11, 50-55% by echo 04/22/11. hypotension limiting medication titration   . Hyperlipidemia   . Hyperglycemia    Hospital Course: Joann Hunter is a 78 year old female with no previous history of coronary artery disease until 04/20/2011. She went to [facility] with chest pain and had elevated cardiac enzymes so she was transferred to [facility] where she was treated for a non-ST segment elevation MI. She received a drug-eluting stent to the LAD but had residual coronary artery disease, treated medically. She was discharged on 04/23/2011.   On 04/25/2011, she was having some chest pain that she describes as a soreness that was unlike her previous anginal/MI symptoms. She had had some diarrhea and also describes poor by mouth intake as she feels her appetite was slow to recover after the MI. She took a sublingual nitroglycerin for the chest pain and then had a near-syncopal/syncopal episode, witnessed by her daughter. She was transported to the hospital and admitted for further evaluation and treatment.  She had some elevation in her CK MBs but the total CK and troponins were all within normal limits. Her chest pain resolved and did not return. Her orthostatic vital signs were negative but  she had received hydration. She had some elevation in her blood sugars but her previous hemoglobin A1c was within normal limits. She was mildly hyponatremic with a sodium level of 130.   On 04/26/2011, Joann Hunter was seen by Dr. Antoine Poche. She is still weak but her ambulation has improved and her orthostatics are negative. She is considered stable for discharge, to followup as an outpatient.  Labs:   Lab Results  Component Value Date   WBC 5.8 04/26/2011   HGB 12.8 04/26/2011   HCT 35.9* 04/26/2011   MCV 88.4 04/26/2011   PLT 208 04/26/2011    Lab 04/26/11 0312 04/21/11 0345  NA 130* --  K 4.1 --  CL 99 --  CO2 20 --  BUN 22 --  CREATININE 1.02 --  CALCIUM 9.2 --  PROT -- 6.1  BILITOT -- 0.5  ALKPHOS -- 62  ALT -- 10  AST -- 40*  GLUCOSE 134* --    Basename 04/26/11 0930 04/26/11 0312 04/26/11 0147  CKTOTAL 70 64 66  CKMB 7.7* 7.0* 6.9*  CKMBINDEX -- -- --  TROPONINI <0.30 <0.30 <0.30    Basename 04/25/11 2330  INR 1.08   Lab Results  Component Value Date   HGBA1C 5.5 04/20/2011      Radiology:  Dg Chest Port 1 View 04/25/2011  *RADIOLOGY REPORT*  Clinical Data: Syncope, chest discomfort  PORTABLE CHEST - 1 VIEW  Comparison: None  Findings: Cardiomediastinal silhouette is within normal limits. The lungs are clear. No pleural effusion.  No pneumothorax.  No acute  osseous abnormality. Right breast clips are noted.  IMPRESSION: No acute cardiopulmonary process.  Original Report Authenticated By: Harrel Lemon, M.D.   EKG: 06-Apr-2011 06:04:49 Normal sinus rhythm Possible Left atrial enlargement T wave abnormality, consider lateral ischemia Vent. rate 72 BPM PR interval 192 ms QRS duration 72 ms QT/QTc 422/462 ms P-R-T axes 67 50 127   FOLLOW UP PLANS AND APPOINTMENTS Discharge Orders    Future Appointments: Provider: Department: Dept Phone: Center:   05/13/2011 2:40 PM Jonelle Sidle, MD [facility] [phone] [facility]     Allergies  Allergen  Reactions  . Penicillins     Rash    Medication List  As of 04/26/2011  2:26 PM   TAKE these medications         aspirin 81 MG chewable tablet   Chew 81 mg by mouth daily.      atorvastatin 80 MG tablet   Commonly known as: LIPITOR   Take 80 mg by mouth at bedtime.      fish oil-omega-3 fatty acids 1000 MG capsule   Take 1 g by mouth daily.      levothyroxine 100 MCG tablet   Commonly known as: SYNTHROID, LEVOTHROID   Take 100 mcg by mouth daily.      lisinopril 2.5 MG tablet   Commonly known as: PRINIVIL,ZESTRIL   Take 2.5 mg by mouth daily.      metoprolol tartrate 25 MG tablet   Commonly known as: LOPRESSOR   Take 12.5 mg by mouth 2 (two) times daily.      nitroGLYCERIN 0.4 MG SL tablet   Commonly known as: NITROSTAT   Place 0.4 mg under the tongue every 5 (five) minutes x 3 doses as needed. For chest pain.      Ticagrelor 90 MG Tabs tablet   Commonly known as: BRILINTA   Take 90 mg by mouth 2 (two) times daily.           Follow-up Information    Follow up with Rollene Rotunda, MD. (April 30th at 12:15 pm)    Contact information:   [address], [address] [place] [address] [phone]         BRING ALL MEDICATIONS WITH YOU TO FOLLOW UP APPOINTMENTS  Time spent with patient to include physician time: 42 min Signed: Theodore Demark 04/26/2011, 2:26 PM Co-Sign MD  Patient seen and examined.  Plan as discussed in my rounding note for today and outlined above. Rollene Rotunda  04/26/2011  4:21 PM

## 2011-04-26 NOTE — Progress Notes (Signed)
Patient Name: Joann Hunter Date of Encounter: 04/26/2011  Principal Problem:  *Chest pain at rest Active Problems:  CAD (coronary artery disease)  Cardiomyopathy  Syncope, near   SUBJECTIVE: Still weak when she gets up but no more syncope/presyncope. Had the syncope/near-syncope shortly after taking NTG.   Had diarrhea 2 days ago, resolved. Was eating poorly but appetite is improving. Chest soreness was reason for NTG but this has resolved. No SOB.   OBJECTIVE Filed Vitals:   04/26/11 0152 04/26/11 0153 04/26/11 0215 04/26/11 0256  BP: 143/73 120/59 131/49 139/69  Pulse: 77 80 72 67  Temp:    97.4 F (36.3 C)  TempSrc:    Oral  Resp:   17 18  Height:    5\' 2"  (1.575 m)  Weight:    151 lb (68.493 kg)  SpO2:   98% 99%   No intake or output data in the 24 hours ending 04/26/11 1009 Weight change:  Filed Weights   04/26/11 0256  Weight: 151 lb (68.493 kg)     PHYSICAL EXAM General: Well developed, well nourished, elderly female in no acute distress. Head: Normocephalic, atraumatic.  Neck: Supple without bruits, JVD not elevated. Lungs:  Resp regular and unlabored, CTA. Heart: RRR, S1, S2, no S3, S4, 2-3/6 murmur. Abdomen: Soft, non-tender, non-distended, BS + x 4.  Extremities: No clubbing, cyanosis, no edema.  Neuro: Alert and oriented X 3. Moves all extremities spontaneously. Psych: Normal affect.  LABS: CBC: Basename 04/26/11 0312 04/25/11 2344 04/25/11 2330  WBC 5.8 -- 5.1  NEUTROABS -- -- 3.2  HGB 12.8 12.2 --  HCT 35.9* 36.0 --  MCV 88.4 -- 88.8  PLT 208 -- 200   INR: Basename 04/25/11 2330  INR 1.08   Basic Metabolic Panel: Basename 04/26/11 0312 04/25/11 2344  NA 130* 134*  K 4.1 4.2  CL 99 105  CO2 20 --  GLUCOSE 134* 166*  BUN 22 24*  CREATININE 1.02 1.10  CALCIUM 9.2 --  MG -- --  PHOS -- --   Cardiac Enzymes: Basename 04/26/11 04/26/11 0312 04/26/11 0147 04/25/11 2331  CKTOTAL 70 64 66 --  CKMB 7.7 7.0* 6.9* --  CKMBINDEX  N/A --N/A N/A --  TROPONINI <0.30 <0.30 <0.30 <0.30   TELE:  SR  ECG: 26-Apr-2011 06:04:49  Normal sinus rhythm Possible Left atrial enlargement T wave abnormality, consider lateral ischemia Abnormal ECG Vent. rate 72 BPM PR interval 192 ms QRS duration 72 ms QT/QTc 422/462 ms P-R-T axes 67 50 127  25-Apr-2011 23:06:48  SINUS RHYTHM ~ normal P axis, V-rate 50- 99 LAA, CONSIDER BIATRIAL ABNORMALITIES ~ P>64mS <-.26mV V1&>.55mV limb lds ABNORMAL T, CONSIDER ISCHEMIA, ANT-LAT LEADS ~ T <-0.31mV, I aVL V2-V6 since last tracing the depth of the T wave abnormality has diminished Vent. rate 69 BPM PR interval 188 ms QRS duration 80 ms QT/QTc 452/484 ms P-R-T axes 68 59 167  Radiology/Studies: Dg Chest Port 1 View 04/25/2011  *RADIOLOGY REPORT*  Clinical Data: Syncope, chest discomfort  PORTABLE CHEST - 1 VIEW  Comparison: None  Findings: Cardiomediastinal silhouette is within normal limits. The lungs are clear. No pleural effusion.  No pneumothorax.  No acute osseous abnormality. Right breast clips are noted.  IMPRESSION: No acute cardiopulmonary process.  Original Report Authenticated By: Harrel Lemon, M.D.    Current Medications:     . aspirin  324 mg Oral Once  . aspirin  81 mg Oral Daily  . atorvastatin  80 mg Oral QHS  .  heparin  5,000 Units Subcutaneous Q8H  . levothyroxine  100 mcg Oral QAC breakfast  . lisinopril  2.5 mg Oral Daily  . metoprolol tartrate  12.5 mg Oral BID  . nitroGLYCERIN  1 inch Topical Once  . Ticagrelor  90 mg Oral BID    ASSESSMENT AND PLAN:    *Chest pain at rest - Increased CKMB in the setting of nl CK and negative Troponin is unclear. Enzymes last week were higher. MD advise if the CKMB is from her NSTEMI last week. Continue ASA/BB/statin/ACE. Pt does not feel this pain was like her NSTEMI pain and does not feel further cardiac workup is indicated.  Active Problems:  CAD (coronary artery disease) - Follow   Cardiomyopathy - continue  ACE/BB   Syncope (? Near) - Orthostatic VS have been negative. Pt is to eat and take home Rx now. No significant bradycardia on telemetry. Recheck orthostatic VS at least 2 hours after food/meds. Pt may have had slight dehydration from poor PO intake and recent diarrhea which made her not tolerate the SL NTG. MD review data and advise on d/c this pm if pt condition is stable.    Signed, Theodore Demark , PA-C 10:09 AM 04/26/2011  History reviewed with the patient, no changes to be made.  No further symptomsThe patient exam reveals no orthostatic BP drop.  No chest pain.  No SOB.  No edema.  All available labs, radiology testing, previous records reviewed. Agree with documented assessment and plan.  OK to discharge. Meds as on MAR. Cancel her appt with Diona Browner.  She should follow with me in [place] within 10 days (or elsewhere if I am not in [place]).  Fayrene Fearing Devony Mcgrady  2:43 PM 11/19/2010

## 2011-04-26 NOTE — H&P (Signed)
Physician History and Physical    Joann Hunter MRN: [id number] DOB/AGE: 76-01-1932 76 y.o. Admit date: 04/25/2011  Primary Cardiologist:  Dr. Excell Seltzer  CC:  Chest pain  HPI: Pt is a 78 yo woman with HLD, 3 v CAD, NSTEMI s/p LAD DES 04/21/11, ?ICM EF 50-55% who was dc'd 04/23/11, presents with chest pain.  Pt reports that she has had lots of family visitors since being dc'd from the hospital and has not had a lot of rest.  She has spent most of the day on the couch.  This evening she had sudden onset of chest pain that she describes as a "sore spot" in the center of her chest that lasted about 1-2 min.  She took a SL nitro and then per daughter, pt lost consciousness and slumped down on the couch.  Daughter called 911 and pt remembers waking up to seeing the ambulance lights.  No convulsions witnessed.  No falls or injuries.  Pt was noted to be breathing heavily during the episode.  She had 4 episodes of diarrhea over the past 24 hours, no recent episodes.  Pt reports that this chest pain is much different compared to the pain she had when she had her NSTEMI - pain at that time was a strong pressure and was assoc with nausea and diaphoresis.  Pt feels that the pain that occurred today was "soreness in her chest from the stent."  She has not had recurrence of her chest pain since the initial episode.  She denies PND, orthopnea, LE edema, palpitations, nausea, diaphoresis.  She reports that she is drinking 2 bottles of water per day, but has poor appetite since leaving the hospital so isn't eating as much.  At this time she feels a little weak, but otherwise feels back to normal.  Pt reports compliance with all medications, no missed doses.   Review of systems: A review of 10 organ systems was done and is negative except as stated above in HPI.  Specifically, no abd pain, fevers, n, v, focal weakness, vision changes, SOB.  Past Medical History  Diagnosis Date  . Hypothyroidism   . CAD (coronary  artery disease)     NSTEMI 04/2011 with newly diagnosed 3V CAD s/p PCI/DES mLAD 04/11/11 with residual dz (per Dr. Excell Seltzer, should have consideration of PCI of the occluded RCA based on symptoms and/or consideration of outpatient nuclear perfusion testing after the patient recovers from her infarct)  . Ischemic cardiomyopathy     EF 45% by cath 04/20/11, 50-55% by echo 04/22/11. hypotension limiting medication titration   . Hyperlipidemia   . Hyperglycemia    Past Surgical History  Procedure Date  . Breast lumpectomy     right   History   Social History  . Marital Status: Married    Spouse Name: N/A    Number of Children: N/A  . Years of Education: N/A   Occupational History  . Not on file.   Social History Main Topics  . Smoking status: Never Smoker   . Smokeless tobacco: Never Used  . Alcohol Use: No  . Drug Use: No  . Sexually Active: No   Other Topics Concern  . Not on file   Social History Narrative   Lives in [place], [place] with her husband and daughter.    Family History  Problem Relation Age of Onset  . Other      no known family cardiac disease  Allergies  Allergen Reactions  . Penicillins     Rash     Home meds reviewed in EPIC  aspirin 81 MG chewable tablet    Chew 81 mg by mouth daily.     atorvastatin 80 MG tablet    Commonly known as: LIPITOR    Take 1 tablet (80 mg total) by mouth at bedtime.     fish oil-omega-3 fatty acids 1000 MG capsule    Take 1 g by mouth daily.     levothyroxine 100 MCG tablet    Commonly known as: SYNTHROID, LEVOTHROID    Take 100 mcg by mouth daily.     lisinopril 2.5 MG tablet    Commonly known as: PRINIVIL,ZESTRIL    Take 1 tablet (2.5 mg total) by mouth daily.     metoprolol tartrate 25 MG tablet    Commonly known as: LOPRESSOR    Take 0.5 tablets (12.5 mg total) by mouth 2 (two) times daily.     nitroGLYCERIN 0.4 MG SL tablet    Commonly known as: NITROSTAT    Place 1 tablet (0.4 mg total) under the  tongue every 5 (five) minutes x 3 doses as needed for chest pain.     Ticagrelor 90 MG Tabs tablet    Commonly known as: BRILINTA    Take 1 tablet (90 mg total) by mouth 2 (two) times daily.      Current facility-administered medications:0.9 %  sodium chloride infusion, , Intravenous, Continuous, Ward Givens, MD, Last Rate: 100 mL/hr at 04/25/11 2340;  aspirin chewable tablet 324 mg, 324 mg, Oral, Once, Ward Givens, MD, 324 mg at 04/25/11 2340;  nitroGLYCERIN (NITROGLYN) 2 % ointment 1 inch, 1 inch, Topical, Once, Ward Givens, MD, 1 inch at 04/25/11 2340 Current outpatient prescriptions:aspirin 81 MG chewable tablet, Chew 81 mg by mouth daily., Disp: , Rfl: ;  atorvastatin (LIPITOR) 80 MG tablet, Take 80 mg by mouth at bedtime., Disp: , Rfl: ;  fish oil-omega-3 fatty acids 1000 MG capsule, Take 1 g by mouth daily., Disp: , Rfl: ;  levothyroxine (SYNTHROID, LEVOTHROID) 100 MCG tablet, Take 100 mcg by mouth daily., Disp: , Rfl:  lisinopril (PRINIVIL,ZESTRIL) 2.5 MG tablet, Take 2.5 mg by mouth daily., Disp: , Rfl: ;  metoprolol tartrate (LOPRESSOR) 25 MG tablet, Take 12.5 mg by mouth 2 (two) times daily., Disp: , Rfl: ;  nitroGLYCERIN (NITROSTAT) 0.4 MG SL tablet, Place 0.4 mg under the tongue every 5 (five) minutes x 3 doses as needed. For chest pain., Disp: , Rfl: ;  Ticagrelor (BRILINTA) 90 MG TABS tablet, Take 90 mg by mouth 2 (two) times daily., Disp: , Rfl:   Physical Exam: Blood pressure 133/54, pulse 69, temperature 97.8 F (36.6 C), temperature source Oral, resp. rate 22, SpO2 99.00%.; There is no height or weight on file to calculate BMI. Temp:  [97.8 F (36.6 C)] 97.8 F (36.6 C) (04/22 2303) Pulse Rate:  [69-78] 69  (04/22 2345) Resp:  [20-22] 22  (04/22 2345) BP: (126-133)/(54-103) 133/54 mmHg (04/22 2345) SpO2:  [97 %-99 %] 99 % (04/22 2345)  No intake or output data in the 24 hours ending 04/26/11 0032 General: NAD, resting comfortably Heent: dry mucous membranes Neck: No JVD    CV: RRR, nl S1/S2, no S3/S4, no murmur  Lungs: Clear to auscultation bilaterally with normal respiratory effort Abdomen: Soft, nontender, nondistended Extremities: No clubbing or cyanosis.  No pedal edema Skin: Intact without lesions or rashes; ecchymosis R wrist  at prior cath site Neurologic: Alert and oriented, grossly nonfocal  Psych: Normal mood and affect    Labs:  [facility] 04/25/11 2331  CKTOTAL --  CKMB --  TROPONINI <0.30   Lab Results  Component Value Date   WBC 5.1 04/25/2011   HGB 12.2 04/25/2011   HCT 36.0 04/25/2011   MCV 88.8 04/25/2011   PLT 200 04/25/2011    Lab 04/25/11 2344 04/23/11 0700 04/21/11 0345  NA 134* -- --  K 4.2 -- --  CL 105 -- --  CO2 -- 21 --  BUN 24* -- --  CREATININE 1.10 -- --  CALCIUM -- 8.9 --  PROT -- -- 6.1  BILITOT -- -- 0.5  ALKPHOS -- -- 62  ALT -- -- 10  AST -- -- 40*  GLUCOSE 166* -- --   Lab Results  Component Value Date   CHOL 205* 04/21/2011   HDL 37* 04/21/2011   LDLCALC 149* 04/21/2011   TRIG 95 04/21/2011       EKG:  Sinus, LAA, TWI I, aVL V2-6, similar to prior EKGs  Echo:  04/22/11 - Left ventricle: Distal septal and apical hypokinesis The cavity size was normal. Wall thickness was increased in a pattern of moderate LVH. Systolic function was normal. The estimated ejection fraction was in the range of 50% to 55%. - Atrial septum: No defect or patent foramen ovale was identified.    Cath:  04/20/11 Hemodynamic Findings:  Central aortic pressure: 137/64  Left ventricular pressure: 137/4/11  Angiographic Findings:  Left main: Distal 40% stenosis.  Left Anterior Descending Artery: Large caliber vessel that courses to the apex. The proximal vessel has diffuse 40% calcified stenosis. The mid LAD has 99% stenosis. The first diagonal branch is small to moderate sized with ostial 90% stenosis and mid 90% stenosis. The second diagonal branch is small to moderate sized with ostial 70% stenosis. The distal LAD has  diffuse 30% stenosis.  Circumflex Artery: Moderate sized vessel with 80% ostial stenosis. The proximal and mid vessel has serial 30% stenoses. There is a small caliber intermediate branch with 99% proximal stenosis. The OM is a bifurcating vessel with 40% stenosis in the superior branch. The inferior branch of the OM has mild plaque disease.  Right Coronary Artery: Large, dominant vessel with 50% diffuse proximal stenosis and 100% mid occlusion. The distal vessel fills from left to right collaterals.  Left Ventricular Angiogram: LVEF 45% with hypokinesis of the anterior wall, apex and inferoapical wall.  Impression:  1. Severe triple vessel CAD  2. Segmental LV systolic dysfunction  3. NSTEMI  04/21/11 CARDIAC CATH NOTE  Lesion Data:  Vessel: The LAD  Percent stenosis (pre): 95  TIMI-flow (pre): 3  Stent: 2.5 x 20 mm Promus element  Percent stenosis (post): 0  TIMI-flow (post): 3  Conclusions: Successful PCI of the mid LAD using a drug-eluting stent platform.   Radiology:  Dg Chest Port 1 View  04/25/2011  *RADIOLOGY REPORT*  Clinical Data: Syncope, chest discomfort  PORTABLE CHEST - 1 VIEW  Comparison: None  Findings: Cardiomediastinal silhouette is within normal limits. The lungs are clear. No pleural effusion.  No pneumothorax.  No acute osseous abnormality. Right breast clips are noted.  IMPRESSION: No acute cardiopulmonary process.  Original Report Authenticated By: Harrel Lemon, M.D.    ASSESSMENT:  Pt is a 78 yo woman with HLD, 3 v CAD, NSTEMI s/p LAD DES 04/21/11, ?ICM EF 50-55% who was dc'd 04/23/11, presents with chest pain.  PLAN: Chest pain - sounds atypical and is not consistent with pt's known anginal pain.  EKG unchanged from prior, first set of cardiac enzymes negative.  Pt is currently CP free.  Monitor on tele.  AM EKG.  Serial cardiac enzymes.  Plan last admission was for outpt stress test to look for perfusion defect vs PCI to CTO of mid RCA if recurrent sx.  At  this time, if enzymes remain negative, can plan for outpt stress.  If enzymes turn positive or recurrent typical CP - will cath this admission  Syncope - pt with issues with hypotension related to meds during prior hospitalization.  Pt could have became orthostatic given diarrhea, dec PO intake and then she took SL nitro.  Check orthostatic VS.  Monitor on tele for arrhythmia.  Echo done last week did not show any significant valvular disease  AKI - Cr 0.8-->1.10.  Could be related to recent initiation of ACEi vs dehydration.  S/p IVFs in ED.  Recheck Cr in am  Diarrhea - resolved.  If further episodes, can check for C diff  3V CAD s/p LAD PCI - continue aspirin, brilinta, statin, BB, ACEi.  Pt reports compliance with all meds  HLD - recently started on statin, will continue  ICM - EF 50-55% by last echo.  Euvolemic/dry on exam today.  Continue ACEi, BB  Hypothyroid - continue synthriod  Dispo - admit to cardiology.  Heparin for DVT ppx  Signed: Hilary Hertz, MD Cardiology Fellow 04/26/2011, 12:32 AM

## 2011-04-26 NOTE — ED Notes (Signed)
Dr. Lynelle Doctor made aware of elevated CKMB  Being 6.9. No new orders at this time. Will continue to monitor.

## 2011-05-03 ENCOUNTER — Ambulatory Visit (INDEPENDENT_AMBULATORY_CARE_PROVIDER_SITE_OTHER): Payer: Medicare Other | Admitting: Cardiology

## 2011-05-03 ENCOUNTER — Encounter: Payer: Self-pay | Admitting: Cardiology

## 2011-05-03 VITALS — BP 120/65 | HR 72 | Ht 62.0 in | Wt 150.4 lb

## 2011-05-03 DIAGNOSIS — E785 Hyperlipidemia, unspecified: Secondary | ICD-10-CM

## 2011-05-03 DIAGNOSIS — I428 Other cardiomyopathies: Secondary | ICD-10-CM

## 2011-05-03 DIAGNOSIS — I2510 Atherosclerotic heart disease of native coronary artery without angina pectoris: Secondary | ICD-10-CM

## 2011-05-03 NOTE — Assessment & Plan Note (Signed)
I will check a lipid profile when she returns.

## 2011-05-03 NOTE — Assessment & Plan Note (Signed)
For now I will not up titrate the medications as she is still weak. I will do this in the future.

## 2011-05-03 NOTE — Patient Instructions (Addendum)
The current medical regimen is effective;  continue present plan and medications.  Follow up with Dr Antoine Poche in 6 weeks in [place].

## 2011-05-03 NOTE — Progress Notes (Signed)
   HPI The patient presents for follow up of CAD s/p PCI.   Since discharge from the hospital she has had no acute complaints. She has had none of the chest pain that she was having. She denies any shortness of breath, PND or orthopnea. He has had no palpitations, presyncope or syncope.  Allergies  Allergen Reactions  . Penicillins     Rash     Current Outpatient Prescriptions  Medication Sig Dispense Refill  . aspirin 81 MG chewable tablet Chew 81 mg by mouth daily.      Marland Kitchen atorvastatin (LIPITOR) 80 MG tablet Take 80 mg by mouth at bedtime.      . fish oil-omega-3 fatty acids 1000 MG capsule Take 1 g by mouth daily.      Marland Kitchen levothyroxine (SYNTHROID, LEVOTHROID) 100 MCG tablet Take 100 mcg by mouth daily.      Marland Kitchen lisinopril (PRINIVIL,ZESTRIL) 2.5 MG tablet Take 2.5 mg by mouth daily.      . metoprolol tartrate (LOPRESSOR) 25 MG tablet Take 12.5 mg by mouth 2 (two) times daily.      . nitroGLYCERIN (NITROSTAT) 0.4 MG SL tablet Place 0.4 mg under the tongue every 5 (five) minutes x 3 doses as needed. For chest pain.      . Ticagrelor (BRILINTA) 90 MG TABS tablet Take 90 mg by mouth 2 (two) times daily.        Past Medical History  Diagnosis Date  . Hypothyroidism   . CAD (coronary artery disease)     NSTEMI 04/2011 with newly diagnosed 3V CAD s/p PCI/DES mLAD 04/11/11 with residual dz (per Dr. Excell Seltzer, should have consideration of PCI of the occluded RCA based on symptoms and/or consideration of outpatient nuclear perfusion testing after the patient recovers from her infarct)  . Ischemic cardiomyopathy     EF 45% by cath 04/20/11, 50-55% by echo 04/22/11. hypotension limiting medication titration   . Hyperlipidemia   . Hyperglycemia     Past Surgical History  Procedure Date  . Breast lumpectomy     right  . Coronary stent placement     ROS:  As stated in the HPI and negative for all other systems.  PHYSICAL EXAM BP 120/65  Pulse 72  Ht 5\' 2"  (1.575 m)  Wt 150 lb 6.4 oz (68.221 kg)   BMI 27.51 kg/m2 GENERAL:  Well appearing NECK:  No jugular venous distention, waveform within normal limits, carotid upstroke brisk and symmetric, no bruits, no thyromegaly LYMPHATICS:  No cervical, inguinal adenopathy LUNGS:  Clear to auscultation bilaterally BACK:  No CVA tenderness CHEST:  Unremarkable HEART:  PMI not displaced or sustained,S1 and S2 within normal limits, no S3, no S4, no clicks, no rubs, no murmurs ABD:  Flat, positive bowel sounds normal in frequency in pitch, no bruits, no rebound, no guarding, no midline pulsatile mass, no hepatomegaly, no splenomegaly EXT:  2 plus pulses throughout, no edema, no cyanosis no clubbing, right wrist with some bruising, right femoral without bruit or pulsatile mass.   ASSESSMENT AND PLAN

## 2011-05-03 NOTE — Assessment & Plan Note (Addendum)
The patient has no new sypmtoms.  No further cardiovascular testing is indicated.  We will continue with aggressive risk reduction and meds as listed.  She will think about starting cardiac rehab in [place].

## 2011-05-13 ENCOUNTER — Encounter: Payer: Medicare Other | Admitting: Cardiology

## 2011-06-21 ENCOUNTER — Ambulatory Visit (INDEPENDENT_AMBULATORY_CARE_PROVIDER_SITE_OTHER): Payer: BC Managed Care – PPO | Admitting: Cardiology

## 2011-06-21 ENCOUNTER — Encounter: Payer: Self-pay | Admitting: Cardiology

## 2011-06-21 VITALS — BP 131/65 | HR 69 | Ht 62.0 in | Wt 150.8 lb

## 2011-06-21 DIAGNOSIS — I255 Ischemic cardiomyopathy: Secondary | ICD-10-CM

## 2011-06-21 DIAGNOSIS — I2589 Other forms of chronic ischemic heart disease: Secondary | ICD-10-CM

## 2011-06-21 DIAGNOSIS — E785 Hyperlipidemia, unspecified: Secondary | ICD-10-CM

## 2011-06-21 DIAGNOSIS — I2510 Atherosclerotic heart disease of native coronary artery without angina pectoris: Secondary | ICD-10-CM

## 2011-06-21 NOTE — Assessment & Plan Note (Signed)
Her ejection fraction had improved. I will not titrate her meds further.

## 2011-06-21 NOTE — Progress Notes (Signed)
   HPI The patient presents for follow up of CAD s/p PCI in April of this year.  She continues to do cardiac rehabilitation. She's doing well with the symptoms that she had previously. She denies any chest pressure, neck or arm discomfort. She does not have any shortness of breath, PND or orthopnea. She's had no weight gain or edema. She has had some easy bruising.  Allergies  Allergen Reactions  . Penicillins     Rash     Current Outpatient Prescriptions  Medication Sig Dispense Refill  . aspirin 81 MG chewable tablet Chew 81 mg by mouth daily.      Marland Kitchen atorvastatin (LIPITOR) 80 MG tablet Take 80 mg by mouth at bedtime.      . fish oil-omega-3 fatty acids 1000 MG capsule Take 1 g by mouth daily.      Marland Kitchen levothyroxine (SYNTHROID, LEVOTHROID) 100 MCG tablet Take 100 mcg by mouth daily.      Marland Kitchen lisinopril (PRINIVIL,ZESTRIL) 2.5 MG tablet Take 2.5 mg by mouth daily.      . metoprolol tartrate (LOPRESSOR) 25 MG tablet Take 12.5 mg by mouth 2 (two) times daily.      . nitroGLYCERIN (NITROSTAT) 0.4 MG SL tablet Place 0.4 mg under the tongue every 5 (five) minutes x 3 doses as needed. For chest pain.      . Ticagrelor (BRILINTA) 90 MG TABS tablet Take 90 mg by mouth 2 (two) times daily.        Past Medical History  Diagnosis Date  . Hypothyroidism   . CAD (coronary artery disease)     NSTEMI 04/2011 with newly diagnosed 3V CAD s/p PCI/DES mLAD 04/11/11 with residual dz (per Dr. Excell Seltzer, should have consideration of PCI of the occluded RCA based on symptoms and/or consideration of outpatient nuclear perfusion testing after the patient recovers from her infarct)  . Ischemic cardiomyopathy     EF 45% by cath 04/20/11, 50-55% by echo 04/22/11. hypotension limiting medication titration   . Hyperlipidemia   . Hyperglycemia     Past Surgical History  Procedure Date  . Breast lumpectomy     right  . Coronary stent placement     ROS:  As stated in the HPI and negative for all other  systems.  PHYSICAL EXAM BP 131/65  Pulse 69  Ht 5\' 2"  (1.575 m)  Wt 150 lb 12.8 oz (68.402 kg)  BMI 27.58 kg/m2 GENERAL:  Well appearing NECK:  No jugular venous distention, waveform within normal limits, carotid upstroke brisk and symmetric, no bruits, no thyromegaly LYMPHATICS:  No cervical, inguinal adenopathy LUNGS:  Clear to auscultation bilaterally BACK:  No CVA tenderness CHEST:  Unremarkable HEART:  PMI not displaced or sustained,S1 and S2 within normal limits, no S3, no S4, no clicks, no rubs, no murmurs ABD:  Flat, positive bowel sounds normal in frequency in pitch, no bruits, no rebound, no guarding, no midline pulsatile mass, no hepatomegaly, no splenomegaly EXT:  2 plus pulses throughout, no edema, no cyanosis no clubbing, right wrist with some bruising, right femoral without bruit or pulsatile mass.   ASSESSMENT AND PLAN

## 2011-06-21 NOTE — Assessment & Plan Note (Addendum)
The patient has no new sypmtoms.  No further cardiovascular testing is indicated.  We will continue with aggressive risk reduction and meds as listed.  I encouraged her to continue with the maintenance phase of rehabilitation.

## 2011-06-21 NOTE — Assessment & Plan Note (Signed)
I will defer to BUTLER, CYNTHIA, DO with a goal LDL less than 100 and HDL greater than 40.

## 2011-12-03 ENCOUNTER — Emergency Department (HOSPITAL_COMMUNITY)
Admission: EM | Admit: 2011-12-03 | Discharge: 2011-12-03 | Disposition: A | Discharge status: Home / Self Care | Payer: Medicare Other | Attending: Emergency Medicine | Admitting: Emergency Medicine

## 2011-12-03 ENCOUNTER — Encounter (HOSPITAL_COMMUNITY): Payer: Self-pay | Admitting: *Deleted

## 2011-12-03 DIAGNOSIS — E785 Hyperlipidemia, unspecified: Secondary | ICD-10-CM | POA: Insufficient documentation

## 2011-12-03 DIAGNOSIS — S32009A Unspecified fracture of unspecified lumbar vertebra, initial encounter for closed fracture: Secondary | ICD-10-CM | POA: Insufficient documentation

## 2011-12-03 DIAGNOSIS — G8929 Other chronic pain: Secondary | ICD-10-CM | POA: Insufficient documentation

## 2011-12-03 DIAGNOSIS — Y9289 Other specified places as the place of occurrence of the external cause: Secondary | ICD-10-CM | POA: Insufficient documentation

## 2011-12-03 DIAGNOSIS — R079 Chest pain, unspecified: Secondary | ICD-10-CM | POA: Insufficient documentation

## 2011-12-03 DIAGNOSIS — W19XXXA Unspecified fall, initial encounter: Secondary | ICD-10-CM | POA: Insufficient documentation

## 2011-12-03 DIAGNOSIS — I2510 Atherosclerotic heart disease of native coronary artery without angina pectoris: Secondary | ICD-10-CM | POA: Insufficient documentation

## 2011-12-03 DIAGNOSIS — Z79899 Other long term (current) drug therapy: Secondary | ICD-10-CM | POA: Insufficient documentation

## 2011-12-03 DIAGNOSIS — Y9389 Activity, other specified: Secondary | ICD-10-CM | POA: Insufficient documentation

## 2011-12-03 DIAGNOSIS — E039 Hypothyroidism, unspecified: Secondary | ICD-10-CM | POA: Insufficient documentation

## 2011-12-03 DIAGNOSIS — Z951 Presence of aortocoronary bypass graft: Secondary | ICD-10-CM | POA: Insufficient documentation

## 2011-12-03 DIAGNOSIS — Z8679 Personal history of other diseases of the circulatory system: Secondary | ICD-10-CM | POA: Insufficient documentation

## 2011-12-03 DIAGNOSIS — S32020A Wedge compression fracture of second lumbar vertebra, initial encounter for closed fracture: Secondary | ICD-10-CM

## 2011-12-03 DIAGNOSIS — I10 Essential (primary) hypertension: Secondary | ICD-10-CM | POA: Insufficient documentation

## 2011-12-03 DIAGNOSIS — Z7982 Long term (current) use of aspirin: Secondary | ICD-10-CM | POA: Insufficient documentation

## 2011-12-03 HISTORY — DX: Other chronic pain: G89.29

## 2011-12-03 HISTORY — DX: Dorsalgia, unspecified: M54.9

## 2011-12-03 HISTORY — DX: Essential (primary) hypertension: I10

## 2011-12-03 MED ORDER — DEXAMETHASONE SODIUM PHOSPHATE 10 MG/ML IJ SOLN
10.0000 mg | Freq: Once | INTRAMUSCULAR | Status: AC
  Administered 2011-12-03: 10 mg via INTRAVENOUS
  Filled 2011-12-03: qty 1

## 2011-12-03 MED ORDER — HYDROMORPHONE HCL PF 1 MG/ML IJ SOLN
0.5000 mg | Freq: Once | INTRAMUSCULAR | Status: AC
  Administered 2011-12-03: 0.5 mg via INTRAVENOUS
  Filled 2011-12-03: qty 1

## 2011-12-03 MED ORDER — SODIUM CHLORIDE 0.9 % IV SOLN
Freq: Once | INTRAVENOUS | Status: AC
  Administered 2011-12-03: 1000 mL via INTRAVENOUS

## 2011-12-03 MED ORDER — METHOCARBAMOL 100 MG/ML IJ SOLN
INTRAMUSCULAR | Status: AC
  Filled 2011-12-03: qty 10

## 2011-12-03 MED ORDER — METHOCARBAMOL 100 MG/ML IJ SOLN
1000.0000 mg | Freq: Once | INTRAVENOUS | Status: AC
  Administered 2011-12-03: 1000 mg via INTRAVENOUS
  Filled 2011-12-03: qty 10

## 2011-12-03 MED ORDER — ONDANSETRON HCL 4 MG/2ML IJ SOLN
4.0000 mg | Freq: Once | INTRAMUSCULAR | Status: AC
  Administered 2011-12-03: 4 mg via INTRAVENOUS
  Filled 2011-12-03: qty 2

## 2011-12-03 MED ORDER — METHOCARBAMOL 500 MG PO TABS: ORAL_TABLET | ORAL | Status: DC

## 2011-12-03 MED ORDER — METHOCARBAMOL 100 MG/ML IJ SOLN: 1000.0000 mg | Freq: Once | INTRAMUSCULAR | Status: DC

## 2011-12-03 NOTE — ED Notes (Signed)
MD at bedside. 

## 2011-12-03 NOTE — ED Provider Notes (Signed)
History     CSN: [id number]  Arrival date & time 12/03/11  2051   First MD Initiated Contact with Patient 12/03/11 2116      Chief Complaint  Patient presents with  . Back Pain    (Consider location/radiation/quality/duration/timing/severity/associated sxs/prior treatment) HPI Comments: Patient and family states the patient has a history of chronic back related problems, as well as coronary artery disease requiring stent placements. The patient sustained a fall last week and was seen at the [facility] on Wednesday, November 27 at which time she was diagnosed with a compression fracture. The family is unsure of where the compression fracture is located. The patient was placed on 5 mg of hydrocodone to assist with pain however this was unsuccessful. The patient was then placed on 30 mg morphine tablets and the family states that the patient continues to have severe pain. The patient states that she walks with a walker at home but has severe pain when she is attempting to walk. She also has severe pain with change of positions from lying to sitting. She has had a poor appetite recently because of energy or infection. She complains of feeling weak at times. She had some mild nausea earlier but this has resolved. The patient denies any recent chest pain, shortness of breath, difficulty breathing, neck or jaw pain.  Patient is a 78 y.o. female presenting with back pain. The history is provided by the patient and a relative.  Back Pain  Associated symptoms include chest pain. Pertinent negatives include no abdominal pain and no dysuria.    Past Medical History  Diagnosis Date  . Hypothyroidism   . CAD (coronary artery disease)     NSTEMI 04/2011 with newly diagnosed 3V CAD s/p PCI/DES mLAD 04/11/11 with residual dz (per Dr. Excell Seltzer, should have consideration of PCI of the occluded RCA based on symptoms and/or consideration of outpatient nuclear perfusion testing after the patient recovers from  her infarct)  . Ischemic cardiomyopathy     EF 45% by cath 04/20/11, 50-55% by echo 04/22/11. hypotension limiting medication titration   . Hyperlipidemia   . Hyperglycemia   . Hypertension   . Back pain, chronic     Past Surgical History  Procedure Date  . Breast lumpectomy     right  . Coronary stent placement   . Back surgery     Family History  Problem Relation Age of Onset  . Other      no known family cardiac disease    History  Substance Use Topics  . Smoking status: Never Smoker   . Smokeless tobacco: Never Used  . Alcohol Use: No    OB History    Grav Para Term Preterm Abortions TAB SAB Ect Mult Living                  Review of Systems  Constitutional: Negative for activity change.       All ROS Neg except as noted in HPI  HENT: Negative for nosebleeds and neck pain.   Eyes: Negative for photophobia and discharge.  Respiratory: Negative for cough, shortness of breath and wheezing.   Cardiovascular: Positive for chest pain. Negative for palpitations.  Gastrointestinal: Negative for abdominal pain and blood in stool.  Genitourinary: Negative for dysuria, frequency and hematuria.  Musculoskeletal: Positive for back pain and arthralgias.  Skin: Negative.   Neurological: Negative for dizziness, seizures and speech difficulty.  Psychiatric/Behavioral: Negative for hallucinations and confusion.    Allergies  Penicillins  Home Medications   Current Outpatient Rx  Name  Route  Sig  Dispense  Refill  . ASPIRIN 81 MG PO CHEW   Oral   Chew 81 mg by mouth daily.         . ATORVASTATIN CALCIUM 80 MG PO TABS   Oral   Take 80 mg by mouth at bedtime.         . OMEGA-3 FATTY ACIDS 1000 MG PO CAPS   Oral   Take 1 g by mouth daily.         Marland Kitchen LEVOFLOXACIN 500 MG PO TABS   Oral   Take 500 mg by mouth daily. Started 11/30/11 for 10 days         . LEVOTHYROXINE SODIUM 88 MCG PO TABS   Oral   Take 88 mcg by mouth daily.         Marland Kitchen LISINOPRIL 2.5  MG PO TABS   Oral   Take 2.5 mg by mouth daily.         Marland Kitchen MECLIZINE HCL 25 MG PO TABS   Oral   Take 25 mg by mouth 4 (four) times daily as needed. Dizziness         . METOPROLOL TARTRATE 25 MG PO TABS   Oral   Take 12.5 mg by mouth 2 (two) times daily.         . MORPHINE SULFATE 30 MG PO TABS   Oral   Take 30 mg by mouth every 4 (four) hours as needed. pain         . PREDNISONE 10 MG PO TABS   Oral   Take 30 mg by mouth daily. Patient takes 2 tablets tomorrow and 1 tablet on Monday and finished         . TICAGRELOR 90 MG PO TABS   Oral   Take 90 mg by mouth 2 (two) times daily.         Marland Kitchen NITROGLYCERIN 0.4 MG SL SUBL   Sublingual   Place 0.4 mg under the tongue every 5 (five) minutes x 3 doses as needed. For chest pain.           BP 165/68  Pulse 69  Temp 97.4 F (36.3 C) (Oral)  Resp 18  Ht 5\' 3"  (1.6 m)  Wt 150 lb (68.04 kg)  BMI 26.57 kg/m2  SpO2 96%  Physical Exam  Nursing note and vitals reviewed. Constitutional: She is oriented to person, place, and time. She appears well-developed and well-nourished.  Non-toxic appearance.  HENT:  Head: Normocephalic.  Right Ear: Tympanic membrane and external ear normal.  Left Ear: Tympanic membrane and external ear normal.  Eyes: EOM and lids are normal. Pupils are equal, round, and reactive to light.  Neck: Normal range of motion. Neck supple. Carotid bruit is not present.  Cardiovascular: Normal rate, regular rhythm, intact distal pulses and normal pulses.   Murmur heard. Pulmonary/Chest: Breath sounds normal. No respiratory distress.  Abdominal: Soft. Bowel sounds are normal. There is no tenderness. There is no guarding.  Musculoskeletal: Normal range of motion.       There is pain to palpation of the lower thoracic spine area. There is pain to palpation of the upper lumbar area. There is pain to palpation and with change of position and the paraspinal lumbar area.  There degenerative joint disease  changes of the hands, wrists, and both knees.  Lymphadenopathy:       Head (right side): No submandibular  adenopathy present.       Head (left side): No submandibular adenopathy present.    She has no cervical adenopathy.  Neurological: She is alert and oriented to person, place, and time. She has normal strength. No cranial nerve deficit or sensory deficit.  Skin: Skin is warm and dry.  Psychiatric: She has a normal mood and affect. Her speech is normal.    ED Course  Procedures (including critical care time)  Labs Reviewed - No data to display No results found.   No diagnosis found.    MDM  I have reviewed nursing notes, vital signs, and all appropriate lab and imaging results for this patient. Patient had x-rays of the lumbar spine at the [facility]. The x-ray reveals a compression fracture at the L2 area. Patient is given IV Dilaudid, Robaxin, and Decadron, to assist with pain. Patient reports pain significantly improved after IV medications. I assisted the patient with changing positions from lying to sitting to standing with minimal pain. Patient ambulated in the room with minimal pain.  Patient seen with me by Dr.Bonk. Plan at this time is for the patient to continue her morphine tablets, and add the Robaxin for assistance with spasm pain. Dr. Charm Barges is assisting the patient in getting orthopedic evaluation. Patient is advised to use her walker at all times when getting up and getting about.      Kathie Dike, [place] 12/08/11 [id number]

## 2011-12-03 NOTE — ED Notes (Signed)
Pt alert & oriented x4. Family given discharge instructions, paperwork & prescription(s). Family verbalized understanding. Pt left department w/ no further questions.

## 2011-12-03 NOTE — Discharge Instructions (Signed)
I have reviewed your x-rays and they reveal a compression fracture of the L2 lumbar vertebrae. Please continue the morphine ordered by Dr. Charm Barges. Please add Robaxin, one tablet 3 times daily for spasm pain. Please see the orthopedic specialist as suggested by Dr. Charm Barges. Please use your walker to get around, and prevent falls. Morphine and Robaxin may cause drowsiness, they also cause lightheadedness and dizziness, please use with caution.Back, Compression Fracture A compression fracture happens when a force is put upon the length of your spine. Slipping and falling on your bottom are examples of such a force. When this happens, sometimes the force is great enough to compress the building blocks (vertebral bodies) of your spine. Although this causes a lot of pain, this can usually be treated at home, unless your caregiver feels hospitalization is needed for pain control. Your backbone (spinal column) is made up of 24 main vertebral bodies in addition to the sacrum and coccyx (see illustration). These are held together by tough fibrous tissues (ligaments) and by support of your muscles. Nerve roots pass through the openings between the vertebrae. A sudden wrenching move, injury, or a fall may cause a compression fracture of one of the vertebral bodies. This may result in back pain or spread of pain into the belly (abdomen), the buttocks, and down the leg into the foot. Pain may also be created by muscle spasm alone. Large studies have been undertaken to determine the best possible course of action to help your back following injury and also to prevent future problems. The recommendations are as follows. FOLLOWING A COMPRESSION FRACTURE: Do the following only if advised by your caregiver.   If a back brace has been suggested or provided, wear it as directed.  DO NOT stop wearing the back brace unless instructed by your caregiver.  When allowed to return to regular activities, avoid a sedentary life style.  Actively exercise. Sporadic weekend binges of tennis, racquetball, water skiing, may actually aggravate or create problems, especially if you are not in condition for that activity.  Avoid sports requiring sudden body movements until you are in condition for them. Swimming and walking are safer activities.  Maintain good posture.  Avoid obesity.  If not already done, you should have a DEXA scan. Based on the results, be treated for osteoporosis. FOLLOWING ACUTE (SUDDEN) INJURY:  Only take over-the-counter or prescription medicines for pain, discomfort, or fever as directed by your caregiver.  Use bed rest for only the most extreme acute episode. Prolonged bed rest may aggravate your condition. Ice used for acute conditions is effective. Use a large plastic bag filled with ice. Wrap it in a towel. This also provides excellent pain relief. This may be continuous. Or use it for 30 minutes every 2 hours during acute phase, then as needed. Heat for 30 minutes prior to activities is helpful.  As soon as the acute phase (the time when your back is too painful for you to do normal activities) is over, it is important to resume normal activities and work Arboriculturist. Back injuries can cause potentially marked changes in lifestyle. So it is important to attack these problems aggressively.  See your caregiver for continued problems. He or she can help or refer you for appropriate exercises, physical therapy and work hardening if needed.  If you are given narcotic medications for your condition, for the next 24 hours DO NOT:  Drive  Operate machinery or power tools.  Sign legal documents.  DO NOT drink alcohol,  take sleeping pills or other medications that may interfere with treatment. If your caregiver has given you a follow-up appointment, it is very important to keep that appointment. Not keeping the appointment could result in a chronic or permanent injury, pain, and disability. If there  is any problem keeping the appointment, you must call back to this facility for assistance.  SEEK IMMEDIATE MEDICAL CARE IF:  You develop numbness, tingling, weakness, or problems with the use of your arms or legs.  You develop severe back pain not relieved with medications.  You have changes in bowel or bladder control.  You have increasing pain in any areas of the body. Document Released: 12/20/2004 Document Revised: 03/14/2011 Document Reviewed: 07/25/2007 [facility] Patient Information 2013 [place], [place].

## 2011-12-03 NOTE — ED Notes (Signed)
Pt arrives via ems d/t back pain. Pt c/o chronic lower back pain pain increases with movement. Pt is here because current pain management is not working. Pt states she did have a fall this past week and was seen and treated.

## 2011-12-03 NOTE — ED Notes (Signed)
Pt reports extreme back pain when moving or standing. Pt was started on morphine 30 mg tabs today. Pt states not helping. Has not been ale to follow up w/ MD after fall on [facility].

## 2011-12-05 ENCOUNTER — Emergency Department (HOSPITAL_COMMUNITY)
Admission: EM | Admit: 2011-12-05 | Discharge: 2011-12-05 | Disposition: A | Discharge status: Home / Self Care | Payer: Medicare Other | Attending: Emergency Medicine | Admitting: Emergency Medicine

## 2011-12-05 ENCOUNTER — Encounter (HOSPITAL_COMMUNITY): Payer: Self-pay

## 2011-12-05 DIAGNOSIS — R110 Nausea: Secondary | ICD-10-CM | POA: Insufficient documentation

## 2011-12-05 DIAGNOSIS — I2510 Atherosclerotic heart disease of native coronary artery without angina pectoris: Secondary | ICD-10-CM | POA: Insufficient documentation

## 2011-12-05 DIAGNOSIS — E039 Hypothyroidism, unspecified: Secondary | ICD-10-CM | POA: Insufficient documentation

## 2011-12-05 DIAGNOSIS — G8929 Other chronic pain: Secondary | ICD-10-CM | POA: Insufficient documentation

## 2011-12-05 DIAGNOSIS — Z79899 Other long term (current) drug therapy: Secondary | ICD-10-CM | POA: Insufficient documentation

## 2011-12-05 DIAGNOSIS — E785 Hyperlipidemia, unspecified: Secondary | ICD-10-CM | POA: Insufficient documentation

## 2011-12-05 DIAGNOSIS — Z8679 Personal history of other diseases of the circulatory system: Secondary | ICD-10-CM | POA: Insufficient documentation

## 2011-12-05 DIAGNOSIS — Z7982 Long term (current) use of aspirin: Secondary | ICD-10-CM | POA: Insufficient documentation

## 2011-12-05 DIAGNOSIS — M549 Dorsalgia, unspecified: Secondary | ICD-10-CM | POA: Insufficient documentation

## 2011-12-05 DIAGNOSIS — R319 Hematuria, unspecified: Secondary | ICD-10-CM | POA: Insufficient documentation

## 2011-12-05 DIAGNOSIS — Z9861 Coronary angioplasty status: Secondary | ICD-10-CM | POA: Insufficient documentation

## 2011-12-05 DIAGNOSIS — I10 Essential (primary) hypertension: Secondary | ICD-10-CM | POA: Insufficient documentation

## 2011-12-05 LAB — URINALYSIS, ROUTINE W REFLEX MICROSCOPIC
Bilirubin Urine: NEGATIVE
Glucose, UA: NEGATIVE mg/dL
Ketones, ur: NEGATIVE mg/dL
Leukocytes, UA: NEGATIVE
Nitrite: NEGATIVE
Protein, ur: NEGATIVE mg/dL
Specific Gravity, Urine: 1.025 (ref 1.005–1.030)
Urobilinogen, UA: 0.2 mg/dL (ref 0.0–1.0)
pH: 5.5 (ref 5.0–8.0)

## 2011-12-05 LAB — CBC WITH DIFFERENTIAL/PLATELET
Basophils Absolute: 0 10*3/uL (ref 0.0–0.1)
Basophils Relative: 0 % (ref 0–1)
Eosinophils Absolute: 0 10*3/uL (ref 0.0–0.7)
Eosinophils Relative: 0 % (ref 0–5)
HCT: 37.2 % (ref 36.0–46.0)
Hemoglobin: 12.7 g/dL (ref 12.0–15.0)
Lymphocytes Relative: 15 % (ref 12–46)
Lymphs Abs: 1.5 10*3/uL (ref 0.7–4.0)
MCH: 31.6 pg (ref 26.0–34.0)
MCHC: 34.1 g/dL (ref 30.0–36.0)
MCV: 92.5 fL (ref 78.0–100.0)
Monocytes Absolute: 1.4 10*3/uL — ABNORMAL HIGH (ref 0.1–1.0)
Monocytes Relative: 12 % (ref 3–12)
Neutro Abs: 8.1 10*3/uL — ABNORMAL HIGH (ref 1.7–7.7)
Neutrophils Relative %: 73 % (ref 43–77)
Platelets: 313 10*3/uL (ref 150–400)
RBC: 4.02 MIL/uL (ref 3.87–5.11)
RDW: 14 % (ref 11.5–15.5)
WBC: 11.1 10*3/uL — ABNORMAL HIGH (ref 4.0–10.5)

## 2011-12-05 LAB — BASIC METABOLIC PANEL
BUN: 51 mg/dL — ABNORMAL HIGH (ref 6–23)
CO2: 25 mEq/L (ref 19–32)
Chloride: 97 mEq/L (ref 96–112)
Creatinine, Ser: 1.83 mg/dL — ABNORMAL HIGH (ref 0.50–1.10)
Glucose, Bld: 241 mg/dL — ABNORMAL HIGH (ref 70–99)
Potassium: 4.2 mEq/L (ref 3.5–5.1)

## 2011-12-05 LAB — URINE MICROSCOPIC-ADD ON

## 2011-12-05 MED ORDER — OXYCODONE-ACETAMINOPHEN 5-325 MG PO TABS: 1.0000 | ORAL_TABLET | Freq: Four times a day (QID) | ORAL | Status: AC | PRN

## 2011-12-05 MED ORDER — ONDANSETRON HCL 4 MG PO TABS: 4.0000 mg | ORAL_TABLET | Freq: Three times a day (TID) | ORAL | Status: DC | PRN

## 2011-12-05 MED ORDER — SULFAMETHOXAZOLE-TRIMETHOPRIM 800-160 MG PO TABS: 1.0000 | ORAL_TABLET | Freq: Two times a day (BID) | ORAL | Status: AC

## 2011-12-05 NOTE — ED Notes (Signed)
Family to nurses station to request stretcher transportation home related to a vertebral fracture and the patient is unable to walk.

## 2011-12-05 NOTE — ED Notes (Signed)
Pt states she has been on pain medication for chronic back pain. States she has been nauseated and not eating for a few days

## 2011-12-05 NOTE — ED Notes (Signed)
Discharge instructions given and reviewed with patient's daughter, Lynden Ang.  Prescriptions given for Zofran, Percocet and Bactrim; effects and use explained.  Cathy verbalized understanding for her mother to complete all Bactrim, sedating effects of Percocet, drink plenty of fluids and keep follow up appointment as scheduled.  Patient discharged home in good condition via EMS.

## 2011-12-05 NOTE — ED Notes (Signed)
Pt refused in and out cath.  Urine collection via bedpan, specimen sent to lab.

## 2011-12-06 LAB — URINE CULTURE

## 2011-12-06 NOTE — ED Provider Notes (Signed)
History     CSN: [id number]  Arrival date & time 12/05/11  1807   First MD Initiated Contact with Patient 12/05/11 1902      Chief Complaint  Patient presents with  . Nausea    (Consider location/radiation/quality/duration/timing/severity/associated sxs/prior treatment) Patient is a 78 y.o. female presenting with flank pain. The history is provided by the patient (the pt complains of back pain). No language interpreter was used.  Flank Pain This is a new problem. The current episode started 12 to 24 hours ago. The problem occurs constantly. The problem has not changed since onset.Pertinent negatives include no chest pain, no abdominal pain and no headaches. Nothing aggravates the symptoms. Nothing relieves the symptoms. She has tried nothing for the symptoms. The treatment provided no relief.    Past Medical History  Diagnosis Date  . Hypothyroidism   . CAD (coronary artery disease)     NSTEMI 04/2011 with newly diagnosed 3V CAD s/p PCI/DES mLAD 04/11/11 with residual dz (per Dr. Excell Seltzer, should have consideration of PCI of the occluded RCA based on symptoms and/or consideration of outpatient nuclear perfusion testing after the patient recovers from her infarct)  . Ischemic cardiomyopathy     EF 45% by cath 04/20/11, 50-55% by echo 04/22/11. hypotension limiting medication titration   . Hyperlipidemia   . Hyperglycemia   . Hypertension   . Back pain, chronic     Past Surgical History  Procedure Date  . Breast lumpectomy     right  . Coronary stent placement   . Back surgery     Family History  Problem Relation Age of Onset  . Other      no known family cardiac disease    History  Substance Use Topics  . Smoking status: Never Smoker   . Smokeless tobacco: Never Used  . Alcohol Use: No    OB History    Grav Para Term Preterm Abortions TAB SAB Ect Mult Living                  Review of Systems  Constitutional: Negative for fatigue.  HENT: Negative for congestion,  sinus pressure and ear discharge.   Eyes: Negative for discharge.  Respiratory: Negative for cough.   Cardiovascular: Negative for chest pain.  Gastrointestinal: Negative for abdominal pain and diarrhea.  Genitourinary: Positive for flank pain. Negative for frequency and hematuria.  Musculoskeletal: Negative for back pain.  Skin: Negative for rash.  Neurological: Negative for seizures and headaches.  Hematological: Negative.   Psychiatric/Behavioral: Negative for hallucinations.    Allergies  Penicillins  Home Medications   Current Outpatient Rx  Name  Route  Sig  Dispense  Refill  . ASPIRIN 81 MG PO CHEW   Oral   Chew 81 mg by mouth daily.         . ATORVASTATIN CALCIUM 80 MG PO TABS   Oral   Take 80 mg by mouth at bedtime.         . OMEGA-3 FATTY ACIDS 1000 MG PO CAPS   Oral   Take 1 g by mouth daily.         Marland Kitchen LEVOFLOXACIN 500 MG PO TABS   Oral   Take 500 mg by mouth daily. Started 11/30/11 for 10 days         . LEVOTHYROXINE SODIUM 88 MCG PO TABS   Oral   Take 88 mcg by mouth daily.         Marland Kitchen LISINOPRIL 2.5 MG  PO TABS   Oral   Take 2.5 mg by mouth daily.         Marland Kitchen MECLIZINE HCL 25 MG PO TABS   Oral   Take 25 mg by mouth 4 (four) times daily as needed. Dizziness         . METHOCARBAMOL 500 MG PO TABS      1 po tid for spasm/pain   21 tablet   0   . METOPROLOL TARTRATE 25 MG PO TABS   Oral   Take 12.5 mg by mouth 2 (two) times daily.         . MORPHINE SULFATE 30 MG PO TABS   Oral   Take 30 mg by mouth every 4 (four) hours as needed. pain         . TICAGRELOR 90 MG PO TABS   Oral   Take 90 mg by mouth 2 (two) times daily.         Marland Kitchen NITROGLYCERIN 0.4 MG SL SUBL   Sublingual   Place 0.4 mg under the tongue every 5 (five) minutes x 3 doses as needed. For chest pain.         Marland Kitchen ONDANSETRON HCL 4 MG PO TABS   Oral   Take 1 tablet (4 mg total) by mouth every 8 (eight) hours as needed for nausea.   20 tablet   0   .  OXYCODONE-ACETAMINOPHEN 5-325 MG PO TABS   Oral   Take 1 tablet by mouth every 6 (six) hours as needed for pain.   40 tablet   0   . SULFAMETHOXAZOLE-TRIMETHOPRIM 800-160 MG PO TABS   Oral   Take 1 tablet by mouth 2 (two) times daily.   14 tablet   0     BP 113/44  Pulse 76  Temp 97.3 F (36.3 C) (Oral)  Resp 16  Ht 5\' 2"  (1.575 m)  Wt 150 lb (68.04 kg)  BMI 27.44 kg/m2  SpO2 94%  Physical Exam  Constitutional: She is oriented to person, place, and time. She appears well-developed.  HENT:  Head: Normocephalic and atraumatic.  Eyes: Conjunctivae normal and EOM are normal. No scleral icterus.  Neck: Neck supple. No thyromegaly present.  Cardiovascular: Normal rate and regular rhythm.  Exam reveals no gallop and no friction rub.   No murmur heard. Pulmonary/Chest: No stridor. She has no wheezes. She has no rales. She exhibits no tenderness.  Abdominal: She exhibits no distension. There is no tenderness. There is no rebound.  Musculoskeletal: Normal range of motion. She exhibits no edema.       Tender right flank  Lymphadenopathy:    She has no cervical adenopathy.  Neurological: She is oriented to person, place, and time. Coordination normal.  Skin: No rash noted. No erythema.  Psychiatric: She has a normal mood and affect. Her behavior is normal.    ED Course  Procedures (including critical care time)  Labs Reviewed  CBC WITH DIFFERENTIAL - Abnormal; Notable for the following:    WBC 11.1 (*)     Neutro Abs 8.1 (*)     Monocytes Absolute 1.4 (*)     All other components within normal limits  BASIC METABOLIC PANEL - Abnormal; Notable for the following:    Sodium 133 (*)     Glucose, Bld 241 (*)     BUN 51 (*)     Creatinine, Ser 1.83 (*)     Calcium 8.3 (*)     GFR calc  non Af Amer 25 (*)     GFR calc Af Amer 30 (*)     All other components within normal limits  URINALYSIS, ROUTINE W REFLEX MICROSCOPIC - Abnormal; Notable for the following:    Hgb urine  dipstick LARGE (*)     All other components within normal limits  URINE MICROSCOPIC-ADD ON - Abnormal; Notable for the following:    Squamous Epithelial / LPF MANY (*)     Bacteria, UA FEW (*)     All other components within normal limits  URINE CULTURE   No results found.   1. Hematuria       MDM          Benny Lennert, MD 12/06/11 1339

## 2011-12-07 ENCOUNTER — Encounter (HOSPITAL_COMMUNITY): Payer: Self-pay | Admitting: Emergency Medicine

## 2011-12-07 ENCOUNTER — Emergency Department (HOSPITAL_COMMUNITY)
Admission: EM | Admit: 2011-12-07 | Discharge: 2011-12-07 | Disposition: A | Discharge status: Home / Self Care | Payer: Medicare Other | Attending: Emergency Medicine | Admitting: Emergency Medicine

## 2011-12-07 DIAGNOSIS — M549 Dorsalgia, unspecified: Secondary | ICD-10-CM | POA: Insufficient documentation

## 2011-12-07 DIAGNOSIS — Y939 Activity, unspecified: Secondary | ICD-10-CM | POA: Insufficient documentation

## 2011-12-07 DIAGNOSIS — Z7982 Long term (current) use of aspirin: Secondary | ICD-10-CM | POA: Insufficient documentation

## 2011-12-07 DIAGNOSIS — Z9889 Other specified postprocedural states: Secondary | ICD-10-CM | POA: Insufficient documentation

## 2011-12-07 DIAGNOSIS — E785 Hyperlipidemia, unspecified: Secondary | ICD-10-CM | POA: Insufficient documentation

## 2011-12-07 DIAGNOSIS — S32009A Unspecified fracture of unspecified lumbar vertebra, initial encounter for closed fracture: Secondary | ICD-10-CM | POA: Insufficient documentation

## 2011-12-07 DIAGNOSIS — Z8639 Personal history of other endocrine, nutritional and metabolic disease: Secondary | ICD-10-CM | POA: Insufficient documentation

## 2011-12-07 DIAGNOSIS — E039 Hypothyroidism, unspecified: Secondary | ICD-10-CM | POA: Insufficient documentation

## 2011-12-07 DIAGNOSIS — Y929 Unspecified place or not applicable: Secondary | ICD-10-CM | POA: Insufficient documentation

## 2011-12-07 DIAGNOSIS — X58XXXA Exposure to other specified factors, initial encounter: Secondary | ICD-10-CM | POA: Insufficient documentation

## 2011-12-07 DIAGNOSIS — I10 Essential (primary) hypertension: Secondary | ICD-10-CM | POA: Insufficient documentation

## 2011-12-07 DIAGNOSIS — G8929 Other chronic pain: Secondary | ICD-10-CM | POA: Insufficient documentation

## 2011-12-07 DIAGNOSIS — R5381 Other malaise: Secondary | ICD-10-CM | POA: Insufficient documentation

## 2011-12-07 DIAGNOSIS — R110 Nausea: Secondary | ICD-10-CM | POA: Insufficient documentation

## 2011-12-07 DIAGNOSIS — Z79899 Other long term (current) drug therapy: Secondary | ICD-10-CM | POA: Insufficient documentation

## 2011-12-07 DIAGNOSIS — Z862 Personal history of diseases of the blood and blood-forming organs and certain disorders involving the immune mechanism: Secondary | ICD-10-CM | POA: Insufficient documentation

## 2011-12-07 DIAGNOSIS — T148XXA Other injury of unspecified body region, initial encounter: Secondary | ICD-10-CM

## 2011-12-07 DIAGNOSIS — I2510 Atherosclerotic heart disease of native coronary artery without angina pectoris: Secondary | ICD-10-CM | POA: Insufficient documentation

## 2011-12-07 LAB — BASIC METABOLIC PANEL
Calcium: 8.4 mg/dL (ref 8.4–10.5)
Creatinine, Ser: 1.23 mg/dL — ABNORMAL HIGH (ref 0.50–1.10)
GFR calc Af Amer: 48 mL/min — ABNORMAL LOW (ref >90)
GFR calc non Af Amer: 41 mL/min — ABNORMAL LOW (ref >90)
Sodium: 130 mEq/L — ABNORMAL LOW (ref 135–145)

## 2011-12-07 LAB — URINALYSIS, ROUTINE W REFLEX MICROSCOPIC
Bilirubin Urine: NEGATIVE
Leukocytes, UA: NEGATIVE
Nitrite: NEGATIVE
Specific Gravity, Urine: 1.018 (ref 1.005–1.030)
Urobilinogen, UA: 1 mg/dL (ref 0.0–1.0)
pH: 6 (ref 5.0–8.0)

## 2011-12-07 LAB — CBC
MCV: 91.1 fL (ref 78.0–100.0)
Platelets: 260 10*3/uL (ref 150–400)
RBC: 4.03 MIL/uL (ref 3.87–5.11)
RDW: 14 % (ref 11.5–15.5)
WBC: 9.7 10*3/uL (ref 4.0–10.5)

## 2011-12-07 LAB — URINE MICROSCOPIC-ADD ON

## 2011-12-07 LAB — POCT I-STAT TROPONIN I

## 2011-12-07 MED ORDER — SODIUM CHLORIDE 0.9 % IV SOLN
INTRAVENOUS | Status: DC
  Administered 2011-12-07: 14:00:00 via INTRAVENOUS

## 2011-12-07 MED ORDER — ONDANSETRON HCL 4 MG/2ML IJ SOLN
4.0000 mg | Freq: Once | INTRAMUSCULAR | Status: AC
  Administered 2011-12-07: 4 mg via INTRAVENOUS
  Filled 2011-12-07: qty 2

## 2011-12-07 MED ORDER — SODIUM CHLORIDE 0.9 % IV BOLUS (SEPSIS)
500.0000 mL | Freq: Once | INTRAVENOUS | Status: AC
  Administered 2011-12-07: 500 mL via INTRAVENOUS

## 2011-12-07 NOTE — ED Notes (Signed)
Pt states she is only nauseas when moving around, abdomen tender on palpation all 4 quadrants, states she has not been hungry and no BM since 3 days. A&Ox4, ambulatory with walker and standby assist, stable.

## 2011-12-07 NOTE — ED Notes (Signed)
Per EMS, pt presents with nausea from home, her physician, Dr. Charm Barges, wanted her to come to ER d/t cardiac hx. Pt noncompliant with pain meds. Hx hyperlipidemia, stent in heart, hypertension. EKG NSR with peaked P waves and biphasic. Sats 96% on ra, pt placed on 4L for comfort in route O2 increased to 99%. PIV left hand 22G. BP 152-107 systolic. A&Ox4, ambulatory with use of walker and standby assist. Compound back fracture from previous fall.

## 2011-12-07 NOTE — ED Provider Notes (Signed)
History     CSN: [id number]  Arrival date & time 12/07/11  1151   First MD Initiated Contact with Patient 12/07/11 1157      Chief Complaint  Patient presents with  . Nausea    (Consider location/radiation/quality/duration/timing/severity/associated sxs/prior treatment) HPI Comments: 78 year old female with a history of hypertension, hyperlipidemia, CAD with stent placement and lumbar compound compression fracture presents emergency department complaining of generalized weakness and nausea.  History obtained from family members and medical records.  Patient was advised to come to the emergency department for cardiac rule out do to medication noncompliance.  Symptoms were originally associated with a sinus infection that was being treated with an antibiotic which may have been the source of nausea.  Weakness and nausea have been occurring intermittently for about a week which elicited the fall that occurred last Wednesday causing patient's recent compression fracture.  Since that time patient was evaluated at [facility] twice, once for pain management and once for dehydration at which time he was diagnosed with a kidney infection and started on Bactrim.  Patient's daughter has only been giving her half of a tablet do to the nausea.  In reviewing medical records it appears that patient did not have bacteria growing her culture and does not in fact have a urinary tract infection.  Current symptoms began this morning and were on relieved by Zofran.  Daughter reports that her mother has been sweating but denies any fever, chest pain, shortness of breath, cough, hemoptysis.  The history is provided by the patient.    Past Medical History  Diagnosis Date  . Hypothyroidism   . CAD (coronary artery disease)     NSTEMI 04/2011 with newly diagnosed 3V CAD s/p PCI/DES mLAD 04/11/11 with residual dz (per Dr. Excell Seltzer, should have consideration of PCI of the occluded RCA based on symptoms and/or  consideration of outpatient nuclear perfusion testing after the patient recovers from her infarct)  . Ischemic cardiomyopathy     EF 45% by cath 04/20/11, 50-55% by echo 04/22/11. hypotension limiting medication titration   . Hyperlipidemia   . Hyperglycemia   . Hypertension   . Back pain, chronic     Past Surgical History  Procedure Date  . Breast lumpectomy     right  . Coronary stent placement   . Back surgery     Family History  Problem Relation Age of Onset  . Other      no known family cardiac disease    History  Substance Use Topics  . Smoking status: Never Smoker   . Smokeless tobacco: Never Used  . Alcohol Use: No    OB History    Grav Para Term Preterm Abortions TAB SAB Ect Mult Living                  Review of Systems  Constitutional: Positive for fatigue. Negative for fever, chills and appetite change.  HENT: Negative for congestion.   Eyes: Negative for visual disturbance.  Respiratory: Negative for shortness of breath.   Cardiovascular: Negative for chest pain and leg swelling.  Gastrointestinal: Positive for nausea. Negative for vomiting, abdominal pain, diarrhea, constipation, blood in stool, abdominal distention, anal bleeding and rectal pain.  Genitourinary: Negative for dysuria, urgency and frequency.  Musculoskeletal: Positive for back pain.  Neurological: Positive for weakness. Negative for dizziness, syncope, light-headedness, numbness and headaches.  Psychiatric/Behavioral: Negative for confusion.  All other systems reviewed and are negative.    Allergies  Penicillins  Home Medications   Current Outpatient Rx  Name  Route  Sig  Dispense  Refill  . ASPIRIN 81 MG PO CHEW   Oral   Chew 81 mg by mouth daily.         . ATORVASTATIN CALCIUM 80 MG PO TABS   Oral   Take 80 mg by mouth at bedtime.         . OMEGA-3 FATTY ACIDS 1000 MG PO CAPS   Oral   Take 1 g by mouth daily.         Marland Kitchen LEVOFLOXACIN 500 MG PO TABS   Oral   Take  500 mg by mouth daily. Started 11/30/11 for 10 days         . LEVOTHYROXINE SODIUM 88 MCG PO TABS   Oral   Take 88 mcg by mouth daily.         Marland Kitchen LISINOPRIL 2.5 MG PO TABS   Oral   Take 2.5 mg by mouth daily.         Marland Kitchen MECLIZINE HCL 25 MG PO TABS   Oral   Take 25 mg by mouth 4 (four) times daily as needed. Dizziness         . METOPROLOL TARTRATE 25 MG PO TABS   Oral   Take 12.5 mg by mouth 2 (two) times daily.         . MORPHINE SULFATE 30 MG PO TABS   Oral   Take 30 mg by mouth every 4 (four) hours as needed. pain         . NITROGLYCERIN 0.4 MG SL SUBL   Sublingual   Place 0.4 mg under the tongue every 5 (five) minutes x 3 doses as needed. For chest pain.         Marland Kitchen ONDANSETRON HCL 4 MG PO TABS   Oral   Take 1 tablet (4 mg total) by mouth every 8 (eight) hours as needed for nausea.   20 tablet   0   . OXYCODONE-ACETAMINOPHEN 5-325 MG PO TABS   Oral   Take 1 tablet by mouth every 6 (six) hours as needed for pain.   40 tablet   0   . SULFAMETHOXAZOLE-TRIMETHOPRIM 800-160 MG PO TABS   Oral   Take 1 tablet by mouth 2 (two) times daily.   14 tablet   0   . TICAGRELOR 90 MG PO TABS   Oral   Take 90 mg by mouth 2 (two) times daily.         Marland Kitchen METHOCARBAMOL 500 MG PO TABS      1 po tid for spasm/pain   21 tablet   0     BP 147/66  Pulse 76  Temp 97.5 F (36.4 C) (Oral)  Resp 18  SpO2 96%  Physical Exam  Constitutional: She is oriented to person, place, and time. She appears well-developed and well-nourished. No distress.  HENT:  Head: Normocephalic and atraumatic.  Mouth/Throat: Oropharynx is clear and moist. No oropharyngeal exudate.  Eyes: Conjunctivae normal and EOM are normal. Pupils are equal, round, and reactive to light. No scleral icterus.  Neck: Normal range of motion. Neck supple. No tracheal deviation present. No thyromegaly present.  Cardiovascular: Normal rate, regular rhythm, normal heart sounds and intact distal pulses.    Pulmonary/Chest: Effort normal and breath sounds normal. No stridor. No respiratory distress. She has no wheezes.  Abdominal: Soft.  Musculoskeletal: Normal range of motion. She exhibits no edema and no tenderness.  Neurological: She is alert and oriented to person, place, and time. Coordination normal.  Skin: Skin is warm and dry. No rash noted. She is not diaphoretic. No erythema. No pallor.  Psychiatric: She has a normal mood and affect. Her behavior is normal.    ED Course  Procedures (including critical care time)  Labs Reviewed  BASIC METABOLIC PANEL - Abnormal; Notable for the following:    Sodium 130 (*)     Glucose, Bld 144 (*)     BUN 28 (*)     Creatinine, Ser 1.23 (*)     GFR calc non Af Amer 41 (*)     GFR calc Af Amer 48 (*)     All other components within normal limits  URINALYSIS, ROUTINE W REFLEX MICROSCOPIC - Abnormal; Notable for the following:    Hgb urine dipstick TRACE (*)     All other components within normal limits  URINE MICROSCOPIC-ADD ON - Abnormal; Notable for the following:    Squamous Epithelial / LPF FEW (*)     All other components within normal limits  CBC  POCT I-STAT TROPONIN I  POCT I-STAT TROPONIN I   No results found.   No diagnosis found.   Date: 12/07/2011  Rate: 68  Rhythm: normal sinus rhythm  QRS Axis: normal  Intervals: normal  ST/T Wave abnormalities: nonspecific ST changes  Conduction Disutrbances:none  Narrative Interpretation:   Old EKG Reviewed: non specific T wave inversion of lateral leads seen on 04/26/2011  Neg trop x 2, no new EKG changes  MDM  78 year old female with recent compound lumbar fracture presented to the emergency department complaining of nausea.  Patient has recently been treated with Bactrim DS for a possible urinary tract infection.  Cultures from previous urine sample on 12/05/2011 did not grow bacteria.  As antibiotic may be etiology of nausea patient has been advised to discontinue taking this  antibiotic as she did not have an active urinary tract infection.  Labs reviewed with improved kidney function studies.  Patient is to followup with her primary care physician.  Patient has already been prescribed Zofran and has been advised to continue hydrating orally.  Patient has been able to tolerate greater than 6 fluid ounces by mouth while in the emergency department.  She currently lives at home with daughter and grandson that state they will be able to care for patient.  She is nontoxic and afebrile prior to discharge.  Patient seen and discussed with Dr. Lorenso Courier who is agreeable with plan to discharge.        Jaci Carrel, [place] 12/07/11 1526

## 2011-12-07 NOTE — ED Notes (Signed)
ptr here on arrival when pt arrived in [facility]. They are transporting her home

## 2011-12-07 NOTE — ED Provider Notes (Signed)
Medical screening examination/treatment/procedure(s) were conducted as a shared visit with non-physician practitioner(s) and myself.  I personally evaluated the patient during the encounter.  Pt examined.  No focal deficits noted.  Pt c/o nausea but has no reproducible tenderness.  She had no vomiting in the ER.  Reviewed labs and offered PO challenge.  Pt tolerated oral intake.  Discharged home to f/u closely with her PMD.  She has an appointment in 48 hours with a spine specialist.  Tobin Chad, MD 12/07/11 [phone]

## 2011-12-07 NOTE — ED Notes (Addendum)
Pt states that she is not sick to her stomach, but now her head hurts, she feels lightheaded. Pt denies pain when "perfectly still", she rates her back pain 5/10 with movement.

## 2011-12-08 NOTE — ED Provider Notes (Signed)
Medical screening examination/treatment/procedure(s) were conducted as a shared visit with non-physician practitioner(s) and myself.  I personally evaluated the patient during the encounter Jones Skene, M.D.  Joann Hunter is a 78 y.o. female history of chronic back problems, she also sustained a fall last week and was found to have a compression fracture. Patient is on some pain medicine however is still having some continued pain despite her 30 mg morphine tablets. No fevers, no chills, no paresthesias, no weakness, no chest pain or shortness of breath, weight loss, abdominal pain, nausea, vomiting, diarrhea, rash, myalgias or incontinence. Family and patient are chiefly concerned with mobility and pain. Patient is able to walk with a walker.  VITAL SIGNS:   Filed Vitals:   12/03/11 2200  BP: 125/67  Pulse: 61  Temp:   Resp:    CONSTITUTIONAL: Awake, oriented, appears non-toxic HENT: Atraumatic, normocephalic, oral mucosa pink and moist, airway patent. Nares patent without drainage. External ears normal. EYES: Conjunctiva clear, EOMI, PERRLA NECK: Trachea midline, non-tender, supple CARDIOVASCULAR: Normal heart rate, Normal rhythm, No murmurs, rubs, gallops PULMONARY/CHEST: Clear to auscultation, no rhonchi, wheezes, or rales. Symmetrical breath sounds. Non-tender. ABDOMINAL: Non-distended, soft, non-tender - no rebound or guarding.  BS normal. NEUROLOGIC: Non-focal, moving all four extremities, no gross sensory or motor deficits. EXTREMITIES: No clubbing, cyanosis, or edema SKIN: Warm, Dry, No erythema, No rash  [id number] Joann Hunter is a 78 y.o. female presenting with back pain likely secondary to compression fracture. Discussed treatment options with the patient and family which included physical therapy and medication right now. We'll steer away from NSAIDs as the patient is elderly and does have a history of stents. Patient's family mentioned something about "an operation" - 2  which they were referring to vertebroplasty, I informed them that there is no good evidence that this alleviates pain or would benefit the patient.  We'll try the patient on muscle relaxers in addition to her morphine-given extreme caution to family and the patient that this may increase the risk of falls and showing to be extremely careful when ambulating and will require assistance from her daughters. Patient and family agree with this.  Patient has appropriate followup at this time.  I explained the diagnosis and have given explicit precautions to return to the ER including weakness, numbness or tingling, severe pain, pain despite medications, fevers, weight loss or any other new or worsening symptoms. The patient understands and accepts the medical plan as it's been dictated and I have answered their questions. Discharge instructions concerning home care and prescriptions have been given.  The patient is STABLE and is discharged to home in good condition.     Jones Skene, MD 12/08/11 [phone]

## 2012-01-31 ENCOUNTER — Telehealth: Payer: Self-pay | Admitting: *Deleted

## 2012-01-31 NOTE — Telephone Encounter (Signed)
Spoke with daughter of patient regarding management of cholesterol medication. Daughter stated that atorvastatin was stopped by Dr. Silvana Newness office due to it making her not feel well. Patient stopped the medication for 2 weeks and all of her symptoms resolved. Daughter called to see if patient needed to be on something different. Daughter stated that patient's cholesterol is checked by Dr. Charm Barges. Nurse advised daughter to call PCP office and check if Charm Barges wanted to try a different statin since she stopped lipitor and check lipid levels. Daughter verbalized understanding of plan.

## 2012-03-14 ENCOUNTER — Encounter: Payer: Self-pay | Admitting: Physician Assistant

## 2012-04-24 ENCOUNTER — Other Ambulatory Visit: Payer: Self-pay

## 2012-04-24 MED ORDER — LISINOPRIL 2.5 MG PO TABS: 2.5000 mg | ORAL_TABLET | Freq: Every day | ORAL | Status: DC

## 2012-04-24 NOTE — Telephone Encounter (Signed)
..   Requested Prescriptions   Pending Prescriptions Disp Refills  . lisinopril (PRINIVIL,ZESTRIL) 2.5 MG tablet 30 tablet 3    Sig: Take 1 tablet (2.5 mg total) by mouth daily.

## 2012-06-18 ENCOUNTER — Encounter: Payer: Self-pay | Admitting: Cardiology

## 2012-06-18 ENCOUNTER — Ambulatory Visit (INDEPENDENT_AMBULATORY_CARE_PROVIDER_SITE_OTHER): Payer: Medicare Other | Admitting: Cardiology

## 2012-06-18 VITALS — BP 112/73 | HR 73 | Ht 62.0 in | Wt 147.0 lb

## 2012-06-18 DIAGNOSIS — I255 Ischemic cardiomyopathy: Secondary | ICD-10-CM

## 2012-06-18 DIAGNOSIS — I2510 Atherosclerotic heart disease of native coronary artery without angina pectoris: Secondary | ICD-10-CM

## 2012-06-18 DIAGNOSIS — E785 Hyperlipidemia, unspecified: Secondary | ICD-10-CM

## 2012-06-18 DIAGNOSIS — I2589 Other forms of chronic ischemic heart disease: Secondary | ICD-10-CM

## 2012-06-18 MED ORDER — ROSUVASTATIN CALCIUM 20 MG PO TABS: 20.0000 mg | ORAL_TABLET | Freq: Every day | ORAL | Status: DC

## 2012-06-18 NOTE — Patient Instructions (Addendum)
Your physician recommends that you schedule a follow-up appointment in: 1 year. You will receive a reminder letter in the mail in about 10 months reminding you to call and schedule your appointment. If you don't receive this letter, please contact our office. Your physician has recommended you make the following change in your medication: STOP BRILINTA. INCREASE ASPIRIN 81 MG TO TWICE DAILY. START CRESTOR 20 MG DAILY. Your new prescription has been sent to your pharmacy. All other medications will remain the same.

## 2012-06-18 NOTE — Progress Notes (Signed)
HPI The patient presents for follow up of CAD s/p PCI in April of last year.  She continues to do cardiac rehabilitation. She's doing well with the symptoms that she had previously. She denies any chest pressure, neck or arm discomfort. She does not have any shortness of breath, PND or orthopnea. She's had no weight gain or edema. She did have some diffuse discomfort that she thought was related to Lipitor in January of this year via telephone call this was stopped. Her daughter reported the symptoms went away though the patient doesn't recall the symptoms.  Unfortunately since I last saw her she has injured her back. She fell. This has limited her activities.     Allergies  Allergen Reactions  . Penicillins     Rash     Current Outpatient Prescriptions  Medication Sig Dispense Refill  . aspirin 81 MG chewable tablet Chew 81 mg by mouth daily.      . fish oil-omega-3 fatty acids 1000 MG capsule Take 1 g by mouth daily.      Marland Kitchen HYDROcodone-acetaminophen (NORCO/VICODIN) 5-325 MG per tablet       . levofloxacin (LEVAQUIN) 500 MG tablet Take 500 mg by mouth daily. Started 11/30/11 for 10 days      . levothyroxine (SYNTHROID, LEVOTHROID) 88 MCG tablet Take 88 mcg by mouth daily.      Marland Kitchen lisinopril (PRINIVIL,ZESTRIL) 2.5 MG tablet Take 1 tablet (2.5 mg total) by mouth daily.  30 tablet  3  . meclizine (ANTIVERT) 25 MG tablet Take 25 mg by mouth 4 (four) times daily as needed. Dizziness      . metoprolol tartrate (LOPRESSOR) 25 MG tablet Take 12.5 mg by mouth 2 (two) times daily.      . nitroGLYCERIN (NITROSTAT) 0.4 MG SL tablet Place 0.4 mg under the tongue every 5 (five) minutes x 3 doses as needed. For chest pain.      Marland Kitchen ondansetron (ZOFRAN) 4 MG tablet Take 1 tablet (4 mg total) by mouth every 8 (eight) hours as needed for nausea.  20 tablet  0  . Ticagrelor (BRILINTA) 90 MG TABS tablet Take 90 mg by mouth 2 (two) times daily.      . Vitamin D, Ergocalciferol, (DRISDOL) 50000 UNITS CAPS 2  (two) times a week.        No current facility-administered medications for this visit.    Past Medical History  Diagnosis Date  . Hypothyroidism   . CAD (coronary artery disease)     NSTEMI 04/2011 with newly diagnosed 3V CAD s/p PCI/DES mLAD 04/11/11 with residual dz (per Dr. Excell Seltzer, should have consideration of PCI of the occluded RCA based on symptoms and/or consideration of outpatient nuclear perfusion testing after the patient recovers from her infarct)  . Ischemic cardiomyopathy     EF 45% by cath 04/20/11, 50-55% by echo 04/22/11. hypotension limiting medication titration   . Hyperlipidemia   . Hyperglycemia   . Hypertension   . Back pain, chronic     Past Surgical History  Procedure Laterality Date  . Breast lumpectomy      right  . Coronary stent placement    . Back surgery      ROS:  As stated in the HPI and negative for all other systems.  PHYSICAL EXAM BP 112/73  Pulse 73  Ht 5\' 2"  (1.575 m)  Wt 147 lb (66.679 kg)  BMI 26.88 kg/m2 GENERAL:  Well appearing NECK:  No jugular venous distention, waveform within normal  limits, carotid upstroke brisk and symmetric, no bruits, no thyromegaly LYMPHATICS:  No cervical, inguinal adenopathy LUNGS:  Clear to auscultation bilaterally BACK:  No CVA tenderness CHEST:  Unremarkable HEART:  PMI not displaced or sustained,S1 and S2 within normal limits, no S3, no S4, no clicks, no rubs, brief apical systolic murmur heard best at the left upper sternal border, no diastolicmurmurs ABD:  Flat, positive bowel sounds normal in frequency in pitch, no bruits, no rebound, no guarding, no midline pulsatile mass, no hepatomegaly, no splenomegaly EXT:  2 plus pulses throughout, no edema, no cyanosis no clubbing, right wrist with some bruising, right femoral without bruit or pulsatile mass.   ASSESSMENT AND PLAN  CAD:  The patient has no new sypmtoms.  No further cardiovascular testing is indicated.  We will continue with aggressive risk  reduction.  She can stop her Brilinta but will take 162 mg of aspirin daily.  ISCHEMIC CARDIOMYOPATHY:  She seems to be euvolemic.  She will continue the meds as listed.   HYPERLIPIDEMIA.  The last LDL in March was 169. I would like for her to try another statin and it was Crestor 20 mg daily.  The blood pressure is at target. No change in medications is indicated. We will continue with therapeutic lifestyle changes (TLC).

## 2012-06-20 ENCOUNTER — Telehealth: Payer: Self-pay | Admitting: Cardiology

## 2012-06-20 NOTE — Telephone Encounter (Signed)
Mrs. Burdell was given Crestor 20 mg. Daughter states that Crestor does not work well with patient. Can this be changed?

## 2012-06-21 MED ORDER — ATORVASTATIN CALCIUM 40 MG PO TABS: 40.0000 mg | ORAL_TABLET | Freq: Every day | ORAL | Status: DC

## 2012-06-21 NOTE — Telephone Encounter (Signed)
Daughter and patient informed. Agrees to start lipitor 40 mg and will break the 80 mg tablets in half daily. Daughter will call office in about 1 week with status. If patient able to tolerate, labs will be ordered at that time.

## 2012-06-21 NOTE — Addendum Note (Signed)
Addended by: Eustace Moore on: 06/21/2012 03:29 PM   Modules accepted: Orders

## 2012-06-21 NOTE — Telephone Encounter (Signed)
Would she be willing to try at least 40 mg of Lipitor?   Her LDL was very elevated at 169.   If she is willing to start this stop the Cretor.  Start Lipitor 40 mg po daily disp number 30 with 11 refills.  Repeat lipid and liver in 10 weeks.

## 2012-06-21 NOTE — Telephone Encounter (Signed)
Patient daughter called to say that patient took crestor 10 years ago prescribed by Dr. Christell Constant and it caused problems for patient. Daughter said the crestor caused patient to lose her appetite resulting in a lot of weight loss , confusion, disoriented and weakness. Daughter concerned about this since crestor was recently prescribed at patient's last office visit.

## 2012-06-26 ENCOUNTER — Ambulatory Visit: Payer: Medicare Other | Admitting: Cardiology

## 2012-06-28 ENCOUNTER — Telehealth: Payer: Self-pay | Admitting: Cardiology

## 2012-06-28 NOTE — Telephone Encounter (Signed)
Pt daughter called and said that the Lipitor is making Joann Hunter feel about the same as it did before weak, with a headache, and doesn't feel like do anything (just wants to lay around). Pt daughter wants to know if Joann Hunter should stop taking the Lipitor. If we call back before 4 PM call Joann Hunter back at work at [phone] and after 4 call her at home at [phone].

## 2012-06-29 ENCOUNTER — Telehealth: Payer: Self-pay

## 2012-06-29 NOTE — Telephone Encounter (Signed)
Patient daughter calling back.  Concerned about mother.  Please call at work.  Advised message from 06.26.14 has been sent.

## 2012-06-29 NOTE — Telephone Encounter (Signed)
Nurse spoke with patient and daughter and informed them both if the lipitor was making her feel that bad that she could stop it and MD would be informed.

## 2012-06-29 NOTE — Telephone Encounter (Signed)
OK to stop lipitor.

## 2012-06-29 NOTE — Telephone Encounter (Signed)
Opened in error

## 2013-03-15 ENCOUNTER — Other Ambulatory Visit: Payer: Self-pay | Admitting: Cardiology

## 2013-06-26 ENCOUNTER — Encounter: Payer: Self-pay | Admitting: Cardiology

## 2013-06-26 ENCOUNTER — Encounter: Payer: Self-pay | Admitting: *Deleted

## 2013-06-26 ENCOUNTER — Ambulatory Visit (INDEPENDENT_AMBULATORY_CARE_PROVIDER_SITE_OTHER): Payer: Medicare Other | Admitting: Cardiology

## 2013-06-26 VITALS — BP 129/66 | HR 64 | Ht 63.0 in | Wt 165.0 lb

## 2013-06-26 DIAGNOSIS — I2510 Atherosclerotic heart disease of native coronary artery without angina pectoris: Secondary | ICD-10-CM

## 2013-06-26 DIAGNOSIS — I255 Ischemic cardiomyopathy: Secondary | ICD-10-CM

## 2013-06-26 DIAGNOSIS — I2589 Other forms of chronic ischemic heart disease: Secondary | ICD-10-CM

## 2013-06-26 DIAGNOSIS — I2109 ST elevation (STEMI) myocardial infarction involving other coronary artery of anterior wall: Secondary | ICD-10-CM

## 2013-06-26 DIAGNOSIS — R079 Chest pain, unspecified: Secondary | ICD-10-CM

## 2013-06-26 DIAGNOSIS — R0989 Other specified symptoms and signs involving the circulatory and respiratory systems: Secondary | ICD-10-CM

## 2013-06-26 NOTE — Patient Instructions (Signed)

## 2013-06-26 NOTE — Progress Notes (Signed)
HPI The patient presents for follow up of CAD s/p PCI. She's doing well without the symptoms that she had previously. She denies any chest pressure, neck or arm discomfort. She does not have any shortness of breath, PND or orthopnea. She's had no weight gain or edema.   She doesn't exercise routinely but she can do activities such as vacuuming. She also cares for her husband.  Allergies  Allergen Reactions  . Penicillins     Rash   . Lipitor [Atorvastatin]     Current Outpatient Prescriptions  Medication Sig Dispense Refill  . aspirin 81 MG chewable tablet Chew 81 mg by mouth 2 (two) times daily.       . fish oil-omega-3 fatty acids 1000 MG capsule Take 1 g by mouth daily.      Marland Kitchen levothyroxine (SYNTHROID, LEVOTHROID) 88 MCG tablet Take 88 mcg by mouth daily.      Marland Kitchen lisinopril (PRINIVIL,ZESTRIL) 2.5 MG tablet TAKE ONE (1) TABLET EACH DAY  30 tablet  3  . metoprolol tartrate (LOPRESSOR) 25 MG tablet Take 12.5 mg by mouth 2 (two) times daily.      . nitroGLYCERIN (NITROSTAT) 0.4 MG SL tablet Place 0.4 mg under the tongue every 5 (five) minutes x 3 doses as needed. For chest pain.      . Vitamin D, Ergocalciferol, (DRISDOL) 50000 UNITS CAPS 2 (two) times a week.       Marland Kitchen HYDROcodone-acetaminophen (NORCO/VICODIN) 5-325 MG per tablet       . meclizine (ANTIVERT) 25 MG tablet Take 25 mg by mouth 4 (four) times daily as needed. Dizziness      . ondansetron (ZOFRAN) 4 MG tablet Take 1 tablet (4 mg total) by mouth every 8 (eight) hours as needed for nausea.  20 tablet  0   No current facility-administered medications for this visit.    Past Medical History  Diagnosis Date  . Hypothyroidism   . CAD (coronary artery disease)     NSTEMI 04/2011 with newly diagnosed 3V CAD s/p PCI/DES mLAD 04/11/11 with residual dz (per Dr. Burt Knack, should have consideration of PCI of the occluded RCA based on symptoms and/or consideration of outpatient nuclear perfusion testing after the patient recovers from her  infarct)  . Ischemic cardiomyopathy     EF 45% by cath 04/20/11, 50-55% by echo 04/22/11. hypotension limiting medication titration   . Hyperlipidemia   . Hyperglycemia   . Hypertension   . Back pain, chronic     Past Surgical History  Procedure Laterality Date  . Breast lumpectomy      right  . Coronary stent placement    . Back surgery      ROS:  As stated in the HPI and negative for all other systems.  PHYSICAL EXAM BP 129/66  Pulse 64  Ht 5\' 3"  (1.6 m)  Wt 165 lb (74.844 kg)  BMI 29.24 kg/m2 GENERAL:  Well appearing NECK:  No jugular venous distention, waveform within normal limits, carotid upstroke brisk and symmetric, left bruit, no thyromegaly LYMPHATICS:  No cervical, inguinal adenopathy LUNGS:  Clear to auscultation bilaterally BACK:  No CVA tenderness CHEST:  Unremarkable HEART:  PMI not displaced or sustained,S1 and S2 within normal limits, no S3, no S4, no clicks, no rubs, brief apical systolic murmur heard best at the left upper sternal border, no diastolicmurmurs ABD:  Flat, positive bowel sounds normal in frequency in pitch, no bruits, no rebound, no guarding, no midline pulsatile mass, no hepatomegaly, no splenomegaly  EXT:  2 plus pulses upper, diminished DP/PT bilateral, no edema, no cyanosis no clubbing, right wrist with some bruising, right femoral without bruit or pulsatile mass.  EKG:  Sinus rhythm, rate 61, axis within normal limits, intervals within normal limits, no acute ST-T wave changes.  06/26/2013   ASSESSMENT AND PLAN  CAD:  The patient has no new sypmtoms.  No further cardiovascular testing is indicated.  We will continue with aggressive risk reduction.    ISCHEMIC CARDIOMYOPATHY:  She seems to be euvolemic.  She will continue the meds as listed.  No further imaging is indicated.   HYPERLIPIDEMIA.   I will defer to Powell, DO  HTN:  The blood pressure is at target. No change in medications is indicated. We will continue with  therapeutic lifestyle changes (TLC).  BRUIT:  I will order a carotid Doppler

## 2013-07-01 ENCOUNTER — Ambulatory Visit (HOSPITAL_COMMUNITY): Payer: Medicare Other | Attending: Cardiovascular Disease | Admitting: Cardiology

## 2013-07-01 DIAGNOSIS — R0989 Other specified symptoms and signs involving the circulatory and respiratory systems: Secondary | ICD-10-CM | POA: Insufficient documentation

## 2013-07-01 DIAGNOSIS — I6529 Occlusion and stenosis of unspecified carotid artery: Secondary | ICD-10-CM | POA: Insufficient documentation

## 2013-07-01 NOTE — Progress Notes (Signed)
Carotid duplex completed 

## 2013-12-11 ENCOUNTER — Encounter (HOSPITAL_COMMUNITY): Payer: Self-pay | Admitting: *Deleted

## 2013-12-11 ENCOUNTER — Emergency Department (HOSPITAL_COMMUNITY): Payer: Medicare Other

## 2013-12-11 ENCOUNTER — Emergency Department (HOSPITAL_COMMUNITY)
Admission: EM | Admit: 2013-12-11 | Discharge: 2013-12-11 | Disposition: A | Discharge status: Home / Self Care | Payer: Medicare Other | Source: Home / Self Care | Attending: Emergency Medicine | Admitting: Emergency Medicine

## 2013-12-11 DIAGNOSIS — N390 Urinary tract infection, site not specified: Secondary | ICD-10-CM

## 2013-12-11 DIAGNOSIS — Z880 Allergy status to penicillin: Secondary | ICD-10-CM

## 2013-12-11 DIAGNOSIS — I252 Old myocardial infarction: Secondary | ICD-10-CM | POA: Insufficient documentation

## 2013-12-11 DIAGNOSIS — R51 Headache: Secondary | ICD-10-CM | POA: Insufficient documentation

## 2013-12-11 DIAGNOSIS — R42 Dizziness and giddiness: Secondary | ICD-10-CM

## 2013-12-11 DIAGNOSIS — Z7982 Long term (current) use of aspirin: Secondary | ICD-10-CM

## 2013-12-11 DIAGNOSIS — Z79899 Other long term (current) drug therapy: Secondary | ICD-10-CM | POA: Insufficient documentation

## 2013-12-11 DIAGNOSIS — M79604 Pain in right leg: Secondary | ICD-10-CM

## 2013-12-11 DIAGNOSIS — G8929 Other chronic pain: Secondary | ICD-10-CM

## 2013-12-11 DIAGNOSIS — I2510 Atherosclerotic heart disease of native coronary artery without angina pectoris: Secondary | ICD-10-CM

## 2013-12-11 DIAGNOSIS — Z9861 Coronary angioplasty status: Secondary | ICD-10-CM

## 2013-12-11 DIAGNOSIS — E039 Hypothyroidism, unspecified: Secondary | ICD-10-CM | POA: Insufficient documentation

## 2013-12-11 DIAGNOSIS — R109 Unspecified abdominal pain: Secondary | ICD-10-CM

## 2013-12-11 DIAGNOSIS — A419 Sepsis, unspecified organism: Secondary | ICD-10-CM | POA: Diagnosis not present

## 2013-12-11 DIAGNOSIS — I10 Essential (primary) hypertension: Secondary | ICD-10-CM | POA: Insufficient documentation

## 2013-12-11 DIAGNOSIS — M1990 Unspecified osteoarthritis, unspecified site: Secondary | ICD-10-CM

## 2013-12-11 DIAGNOSIS — M79605 Pain in left leg: Secondary | ICD-10-CM

## 2013-12-11 LAB — CBC WITH DIFFERENTIAL/PLATELET
BASOS ABS: 0 10*3/uL (ref 0.0–0.1)
BASOS PCT: 0 % (ref 0–1)
EOS ABS: 0 10*3/uL (ref 0.0–0.7)
EOS PCT: 0 % (ref 0–5)
HEMATOCRIT: 33.1 % — AB (ref 36.0–46.0)
Hemoglobin: 11.4 g/dL — ABNORMAL LOW (ref 12.0–15.0)
Lymphocytes Relative: 7 % — ABNORMAL LOW (ref 12–46)
Lymphs Abs: 0.6 10*3/uL — ABNORMAL LOW (ref 0.7–4.0)
MCH: 31.9 pg (ref 26.0–34.0)
MCHC: 34.4 g/dL (ref 30.0–36.0)
MCV: 92.7 fL (ref 78.0–100.0)
MONO ABS: 1 10*3/uL (ref 0.1–1.0)
Monocytes Relative: 12 % (ref 3–12)
NEUTROS ABS: 7.1 10*3/uL (ref 1.7–7.7)
Neutrophils Relative %: 81 % — ABNORMAL HIGH (ref 43–77)
Platelets: 131 10*3/uL — ABNORMAL LOW (ref 150–400)
RBC: 3.57 MIL/uL — ABNORMAL LOW (ref 3.87–5.11)
RDW: 13.7 % (ref 11.5–15.5)
WBC: 8.7 10*3/uL (ref 4.0–10.5)

## 2013-12-11 LAB — URINE MICROSCOPIC-ADD ON

## 2013-12-11 LAB — URINALYSIS, ROUTINE W REFLEX MICROSCOPIC
BILIRUBIN URINE: NEGATIVE
Glucose, UA: 250 mg/dL — AB
KETONES UR: NEGATIVE mg/dL
NITRITE: NEGATIVE
PROTEIN: 100 mg/dL — AB
Specific Gravity, Urine: 1.025 (ref 1.005–1.030)
UROBILINOGEN UA: 1 mg/dL (ref 0.0–1.0)
pH: 6 (ref 5.0–8.0)

## 2013-12-11 LAB — BASIC METABOLIC PANEL
ANION GAP: 12 (ref 5–15)
BUN: 39 mg/dL — ABNORMAL HIGH (ref 6–23)
CALCIUM: 8.8 mg/dL (ref 8.4–10.5)
CHLORIDE: 99 meq/L (ref 96–112)
CO2: 24 mEq/L (ref 19–32)
CREATININE: 2.37 mg/dL — AB (ref 0.50–1.10)
GFR, EST AFRICAN AMERICAN: 21 mL/min — AB (ref >90)
GFR, EST NON AFRICAN AMERICAN: 18 mL/min — AB (ref >90)
Glucose, Bld: 251 mg/dL — ABNORMAL HIGH (ref 70–99)
Potassium: 4.5 mEq/L (ref 3.7–5.3)
Sodium: 135 mEq/L — ABNORMAL LOW (ref 137–147)

## 2013-12-11 MED ORDER — ONDANSETRON HCL 4 MG/2ML IJ SOLN
4.0000 mg | Freq: Once | INTRAMUSCULAR | Status: AC
  Administered 2013-12-11: 4 mg via INTRAVENOUS
  Filled 2013-12-11: qty 2

## 2013-12-11 MED ORDER — CEPHALEXIN 500 MG PO CAPS: 500.0000 mg | ORAL_CAPSULE | Freq: Four times a day (QID) | ORAL | Status: DC

## 2013-12-11 MED ORDER — SODIUM CHLORIDE 0.9 % IV BOLUS (SEPSIS)
500.0000 mL | Freq: Once | INTRAVENOUS | Status: AC
  Administered 2013-12-11: 500 mL via INTRAVENOUS

## 2013-12-11 MED ORDER — DEXTROSE 5 % IV SOLN
1.0000 g | Freq: Once | INTRAVENOUS | Status: AC
  Administered 2013-12-11: 1 g via INTRAVENOUS
  Filled 2013-12-11: qty 10

## 2013-12-11 MED ORDER — HYDROCODONE-ACETAMINOPHEN 5-325 MG PO TABS: 0.5000 | ORAL_TABLET | ORAL | Status: DC | PRN

## 2013-12-11 MED ORDER — MORPHINE SULFATE 2 MG/ML IJ SOLN
2.0000 mg | Freq: Once | INTRAMUSCULAR | Status: AC
  Administered 2013-12-11: 2 mg via INTRAVENOUS
  Filled 2013-12-11: qty 1

## 2013-12-11 NOTE — ED Notes (Signed)
Family states pt was supposed to have a CT of her kidneys but they cancelled it due to her lab work. They then stated they were going to do an [place] on Friday. Family states pts pain has became worse. Microscopic blood in urine x 1 mo, per family. Pt states pain to left lower back radiating down left leg.Pt states pain to left flank. Pt has hx of back injury also.

## 2013-12-11 NOTE — ED Notes (Addendum)
Pt. Ambulated without difficulty. Pt. Denies dizziness.

## 2013-12-11 NOTE — ED Provider Notes (Signed)
CSN: [id number]     Arrival date & time 12/11/13  1553 History  This chart was scribed for Joann Hunter, * by Einar Pheasant, ED Scribe. This patient was seen in room APA05/[facility]05 and the patient's care was started at 5:03 PM.    Chief Complaint  Patient presents with  . Pain   The history is provided by the patient and a relative. No language interpreter was used.   HPI Comments: Joann Hunter is a 80 y.o. female who presents to the Emergency Department complaining of gradual onset worsening bilateral leg pain that started approximately 1 month ago. Daughters states that a month ago she was diagnosed with hematuria by her urologist. Pt is still experiencing Hematuria. However, today she is also endorsing associated dizziness which started yesterday, back pain which she was given shots for by her PCP, and a generalized headache. Pt states that the headache has subsided. Denies fever, chills, nausea, emesis, or abdominal pain.   Past Medical History  Diagnosis Date  . Hypothyroidism   . CAD (coronary artery disease)     NSTEMI 04/2011 with newly diagnosed 3V CAD s/p PCI/DES mLAD 04/11/11 with residual dz (per Dr. Burt Knack, should have consideration of PCI of the occluded RCA based on symptoms and/or consideration of outpatient nuclear perfusion testing after the patient recovers from her infarct)  . Ischemic cardiomyopathy     EF 45% by cath 04/20/11, 50-55% by echo 04/22/11. hypotension limiting medication titration   . Hyperlipidemia   . Hyperglycemia   . Hypertension   . Back pain, chronic    Past Surgical History  Procedure Laterality Date  . Breast lumpectomy      right  . Coronary stent placement    . Back surgery     Family History  Problem Relation Age of Onset  . Other      no known family cardiac disease   History  Substance Use Topics  . Smoking status: Never Smoker   . Smokeless tobacco: Never Used  . Alcohol Use: No   OB History    No data available      Review of Systems  Constitutional: Negative for fever and chills.  Musculoskeletal: Positive for back pain and arthralgias.  Neurological: Positive for dizziness and headaches.  All other systems reviewed and are negative.     Allergies  Penicillins and Lipitor  Home Medications   Prior to Admission medications   Medication Sig Start Date End Date Taking? Authorizing Provider  aspirin 81 MG chewable tablet Chew 81 mg by mouth 2 (two) times daily.     Historical Provider, MD  fish oil-omega-3 fatty acids 1000 MG capsule Take 1 g by mouth daily.    Historical Provider, MD  HYDROcodone-acetaminophen (NORCO/VICODIN) 5-325 MG per tablet  04/23/12   Historical Provider, MD  levothyroxine (SYNTHROID, LEVOTHROID) 88 MCG tablet Take 88 mcg by mouth daily.    Historical Provider, MD  lisinopril (PRINIVIL,ZESTRIL) 2.5 MG tablet TAKE ONE (1) TABLET EACH DAY 03/15/13   Minus Breeding, MD  meclizine (ANTIVERT) 25 MG tablet Take 25 mg by mouth 4 (four) times daily as needed. Dizziness    Historical Provider, MD  metoprolol tartrate (LOPRESSOR) 25 MG tablet Take 12.5 mg by mouth 2 (two) times daily. 04/23/11   Dayna N Dunn, PA-C  nitroGLYCERIN (NITROSTAT) 0.4 MG SL tablet Place 0.4 mg under the tongue every 5 (five) minutes x 3 doses as needed. For chest pain. 04/23/11   Charlie Pitter, PA-C  ondansetron (ZOFRAN) 4 MG tablet Take 1 tablet (4 mg total) by mouth every 8 (eight) hours as needed for nausea. 12/05/11   Maudry Diego, MD  Vitamin D, Ergocalciferol, (DRISDOL) 50000 UNITS CAPS 2 (two) times a week.  05/30/12   Historical Provider, MD   Triage Vitals:BP 119/50 mmHg  Pulse 93  Temp(Src) 98.9 F (37.2 C) (Oral)  Resp 14  Ht 5' 2.5" (1.588 m)  Wt 130 lb (58.968 kg)  BMI 23.38 kg/m2  SpO2 95%  Physical Exam  Constitutional: She is oriented to person, place, and time. She appears well-developed and well-nourished. No distress.  HENT:  Head: Normocephalic and atraumatic.  Right Ear: Hearing  normal.  Left Ear: Hearing normal.  Nose: Nose normal.  Mouth/Throat: Oropharynx is clear and moist and mucous membranes are normal.  Eyes: Conjunctivae and EOM are normal. Pupils are equal, round, and reactive to light.  Neck: Normal range of motion. Neck supple.  Cardiovascular: Regular rhythm, S1 normal and S2 normal.  Exam reveals no gallop and no friction rub.   No murmur heard. Pulmonary/Chest: Effort normal and breath sounds normal. No respiratory distress. She exhibits no tenderness.  Abdominal: Soft. Normal appearance and bowel sounds are normal. There is no hepatosplenomegaly. There is no tenderness. There is no rebound, no guarding, no tenderness at McBurney's point and negative Murphy's sign. No hernia.  Musculoskeletal: Normal range of motion.  Neurological: She is alert and oriented to person, place, and time. She has normal strength. No cranial nerve deficit or sensory deficit. Coordination normal. GCS eye subscore is 4. GCS verbal subscore is 5. GCS motor subscore is 6.  Skin: Skin is warm, dry and intact. No rash noted. No cyanosis.  Psychiatric: She has a normal mood and affect. Her speech is normal and behavior is normal. Thought content normal.  Nursing note and vitals reviewed.   ED Course  Procedures (including critical care time)  DIAGNOSTIC STUDIES: Oxygen Saturation is 95% on RA, low by my interpretation.    COORDINATION OF CARE: 5:06 PM- Pt advised of plan for treatment and pt agrees.  Results for orders placed or performed during the hospital encounter of 12/11/13  CBC with Differential  Result Value Ref Range   WBC 8.7 4.0 - 10.5 K/uL   RBC 3.57 (L) 3.87 - 5.11 MIL/uL   Hemoglobin 11.4 (L) 12.0 - 15.0 g/dL   HCT 33.1 (L) 36.0 - 46.0 %   MCV 92.7 78.0 - 100.0 fL   MCH 31.9 26.0 - 34.0 pg   MCHC 34.4 30.0 - 36.0 g/dL   RDW 13.7 11.5 - 15.5 %   Platelets 131 (L) 150 - 400 K/uL   Neutrophils Relative % 81 (H) 43 - 77 %   Neutro Abs 7.1 1.7 - 7.7 K/uL    Lymphocytes Relative 7 (L) 12 - 46 %   Lymphs Abs 0.6 (L) 0.7 - 4.0 K/uL   Monocytes Relative 12 3 - 12 %   Monocytes Absolute 1.0 0.1 - 1.0 K/uL   Eosinophils Relative 0 0 - 5 %   Eosinophils Absolute 0.0 0.0 - 0.7 K/uL   Basophils Relative 0 0 - 1 %   Basophils Absolute 0.0 0.0 - 0.1 K/uL  Basic metabolic panel  Result Value Ref Range   Sodium 135 (L) 137 - 147 mEq/L   Potassium 4.5 3.7 - 5.3 mEq/L   Chloride 99 96 - 112 mEq/L   CO2 24 19 - 32 mEq/L   Glucose, Bld 251 (  H) 70 - 99 mg/dL   BUN 39 (H) 6 - 23 mg/dL   Creatinine, Ser 2.37 (H) 0.50 - 1.10 mg/dL   Calcium 8.8 8.4 - 10.5 mg/dL   GFR calc non Af Amer 18 (L) >90 mL/min   GFR calc Af Amer 21 (L) >90 mL/min   Anion gap 12 5 - 15  Urinalysis, Routine w reflex microscopic  Result Value Ref Range   Color, Urine YELLOW YELLOW   APPearance HAZY (A) CLEAR   Specific Gravity, Urine 1.025 1.005 - 1.030   pH 6.0 5.0 - 8.0   Glucose, UA 250 (A) NEGATIVE mg/dL   Hgb urine dipstick LARGE (A) NEGATIVE   Bilirubin Urine NEGATIVE NEGATIVE   Ketones, ur NEGATIVE NEGATIVE mg/dL   Protein, ur 100 (A) NEGATIVE mg/dL   Urobilinogen, UA 1.0 0.0 - 1.0 mg/dL   Nitrite NEGATIVE NEGATIVE   Leukocytes, UA MODERATE (A) NEGATIVE  Urine microscopic-add on  Result Value Ref Range   Squamous Epithelial / LPF RARE RARE   WBC, UA TOO NUMEROUS TO COUNT <3 WBC/hpf   RBC / HPF 7-10 <3 RBC/hpf   Bacteria, UA MANY (A) RARE   Ct Renal Stone Study  12/11/2013   CLINICAL DATA:  Left flank pain, low back pain, microscopic hematuria for 1 month  EXAM: CT ABDOMEN AND PELVIS WITHOUT CONTRAST  TECHNIQUE: Multidetector CT imaging of the abdomen and pelvis was performed following the standard protocol without IV contrast.  COMPARISON:  CT lumbar spine 9/ 2/15  FINDINGS: Sagittal images of the spine shows stable compression fracture upper endplate of L2 vertebral body. Again noted diffuse osteopenia and degenerative changes thoracolumbar spine. Significant disc  space flattening with vacuum disc phenomenon at L4-L5 and L5-S1 level.  The lung bases are unremarkable.  Small hiatal hernia. Unenhanced liver shows no biliary ductal dilatation. No calcified gallstones are noted within gallbladder. There is mild pancreatic atrophy. The spleen and adrenal glands are unremarkable.  There is mild right hydronephrosis and proximal right hydroureter. Mild thickening of proximal ureteral wall. From the level of right iliac vein the right ureter is small caliber. No calcified ureteral calculi are noted bilaterally. Although findings may be due to upper right urinary tract inflammation a right mid ureteral stricture or neoplastic process cannot be excluded. Correlation with urology exam enhanced study or retrograde study is recommended. No nephrolithiasis. Bilateral distal ureter is unremarkable.  No small bowel obstruction. Atherosclerotic calcifications of abdominal aorta and iliac arteries. Normal retrocecal appendix. No pericecal inflammation. The terminal ileum is unremarkable. Atrophic uterus. Urinary bladder is unremarkable.  No aortic aneurysm  IMPRESSION: 1. There is mild right hydronephrosis and proximal right hydroureter. There is thickening of proximal right ureteral wall. Although findings may be due to right upper urinary tract inflammation a mid ureteral stricture or neoplastic process cannot be excluded. Correlation with urology exam, enhanced or retrograde study is recommended. 2. No nephrolithiasis. Bilateral no calcified ureteral calculi are noted. No calcified calculi are noted within urinary bladder. 3. Normal retrocecal appendix. 4. No small bowel obstruction. 5. Stable compression fracture of L2 vertebral body. 6. Atrophic uterus.   Electronically Signed   By: Lahoma Crocker M.D.   On: 12/11/2013 18:53      MDM   Final diagnoses:  None  UTI   Workup today shows obvious urinary tract infection. She continues to have mild endoscopic hematuria, but now has too  numerous to count white cells and signs of infection. This was treated with Rocephin, culture  obtained. Patient complaining of pain. She was given a small dose of morphine. This did drop her blood pressure temporarily, but she responded to IV fluids. She is now ambulating without difficulty. CT scan did not show any obvious ureteral stones. There is thickening of the proximal ureteral wall on the right side that is likely secondary to infection. Will treat with course of antibiotics and have follow-up with urology. She is appropriate for discharge, outpatient management.  I personally performed the services described in this documentation, which was scribed in my presence. The recorded information has been reviewed and is accurate.    Joann Greek, MD 12/11/13 2134

## 2013-12-11 NOTE — Discharge Instructions (Signed)

## 2013-12-12 ENCOUNTER — Encounter (HOSPITAL_COMMUNITY): Payer: Self-pay | Admitting: Cardiovascular Disease

## 2013-12-13 ENCOUNTER — Encounter (HOSPITAL_COMMUNITY): Payer: Self-pay | Admitting: Emergency Medicine

## 2013-12-13 ENCOUNTER — Emergency Department (HOSPITAL_COMMUNITY): Payer: Medicare Other

## 2013-12-13 ENCOUNTER — Inpatient Hospital Stay (HOSPITAL_COMMUNITY)
Admission: EM | Admit: 2013-12-13 | Discharge: 2013-12-16 | DRG: 872 | Disposition: A | Discharge status: Home / Self Care | Payer: Medicare Other | Attending: Internal Medicine | Admitting: Internal Medicine

## 2013-12-13 DIAGNOSIS — E039 Hypothyroidism, unspecified: Secondary | ICD-10-CM | POA: Diagnosis present

## 2013-12-13 DIAGNOSIS — I2510 Atherosclerotic heart disease of native coronary artery without angina pectoris: Secondary | ICD-10-CM | POA: Diagnosis present

## 2013-12-13 DIAGNOSIS — R55 Syncope and collapse: Secondary | ICD-10-CM | POA: Diagnosis present

## 2013-12-13 DIAGNOSIS — A419 Sepsis, unspecified organism: Principal | ICD-10-CM | POA: Diagnosis present

## 2013-12-13 DIAGNOSIS — W19XXXA Unspecified fall, initial encounter: Secondary | ICD-10-CM | POA: Diagnosis present

## 2013-12-13 DIAGNOSIS — B9620 Unspecified Escherichia coli [E. coli] as the cause of diseases classified elsewhere: Secondary | ICD-10-CM | POA: Diagnosis present

## 2013-12-13 DIAGNOSIS — N390 Urinary tract infection, site not specified: Secondary | ICD-10-CM | POA: Diagnosis present

## 2013-12-13 DIAGNOSIS — R059 Cough, unspecified: Secondary | ICD-10-CM

## 2013-12-13 DIAGNOSIS — E785 Hyperlipidemia, unspecified: Secondary | ICD-10-CM | POA: Diagnosis present

## 2013-12-13 DIAGNOSIS — M549 Dorsalgia, unspecified: Secondary | ICD-10-CM | POA: Diagnosis present

## 2013-12-13 DIAGNOSIS — E119 Type 2 diabetes mellitus without complications: Secondary | ICD-10-CM

## 2013-12-13 DIAGNOSIS — Z955 Presence of coronary angioplasty implant and graft: Secondary | ICD-10-CM

## 2013-12-13 DIAGNOSIS — R05 Cough: Secondary | ICD-10-CM

## 2013-12-13 DIAGNOSIS — E1165 Type 2 diabetes mellitus with hyperglycemia: Secondary | ICD-10-CM | POA: Diagnosis present

## 2013-12-13 DIAGNOSIS — I129 Hypertensive chronic kidney disease with stage 1 through stage 4 chronic kidney disease, or unspecified chronic kidney disease: Secondary | ICD-10-CM | POA: Diagnosis present

## 2013-12-13 DIAGNOSIS — I255 Ischemic cardiomyopathy: Secondary | ICD-10-CM | POA: Diagnosis present

## 2013-12-13 DIAGNOSIS — Z880 Allergy status to penicillin: Secondary | ICD-10-CM

## 2013-12-13 DIAGNOSIS — R52 Pain, unspecified: Secondary | ICD-10-CM

## 2013-12-13 DIAGNOSIS — G8929 Other chronic pain: Secondary | ICD-10-CM | POA: Diagnosis present

## 2013-12-13 DIAGNOSIS — N184 Chronic kidney disease, stage 4 (severe): Secondary | ICD-10-CM

## 2013-12-13 DIAGNOSIS — N183 Chronic kidney disease, stage 3 (moderate): Secondary | ICD-10-CM | POA: Diagnosis present

## 2013-12-13 DIAGNOSIS — N179 Acute kidney failure, unspecified: Secondary | ICD-10-CM | POA: Diagnosis present

## 2013-12-13 DIAGNOSIS — R42 Dizziness and giddiness: Secondary | ICD-10-CM | POA: Diagnosis present

## 2013-12-13 HISTORY — DX: Unspecified osteoarthritis, unspecified site: M19.90

## 2013-12-13 HISTORY — DX: Acute myocardial infarction, unspecified: I21.9

## 2013-12-13 HISTORY — DX: Urinary tract infection, site not specified: N39.0

## 2013-12-13 HISTORY — DX: Chronic kidney disease, stage 4 (severe): N18.4

## 2013-12-13 LAB — URINALYSIS, ROUTINE W REFLEX MICROSCOPIC
BILIRUBIN URINE: NEGATIVE
Glucose, UA: NEGATIVE mg/dL
KETONES UR: NEGATIVE mg/dL
NITRITE: NEGATIVE
PROTEIN: 30 mg/dL — AB
SPECIFIC GRAVITY, URINE: 1.013 (ref 1.005–1.030)
UROBILINOGEN UA: 1 mg/dL (ref 0.0–1.0)
pH: 6 (ref 5.0–8.0)

## 2013-12-13 LAB — URINE MICROSCOPIC-ADD ON

## 2013-12-13 LAB — CBC WITH DIFFERENTIAL/PLATELET
BASOS PCT: 0 % (ref 0–1)
Basophils Absolute: 0 10*3/uL (ref 0.0–0.1)
EOS ABS: 0.1 10*3/uL (ref 0.0–0.7)
Eosinophils Relative: 1 % (ref 0–5)
HCT: 32.2 % — ABNORMAL LOW (ref 36.0–46.0)
Hemoglobin: 10.8 g/dL — ABNORMAL LOW (ref 12.0–15.0)
Lymphocytes Relative: 7 % — ABNORMAL LOW (ref 12–46)
Lymphs Abs: 0.4 10*3/uL — ABNORMAL LOW (ref 0.7–4.0)
MCH: 30.6 pg (ref 26.0–34.0)
MCHC: 33.5 g/dL (ref 30.0–36.0)
MCV: 91.2 fL (ref 78.0–100.0)
Monocytes Absolute: 0.7 10*3/uL (ref 0.1–1.0)
Monocytes Relative: 14 % — ABNORMAL HIGH (ref 3–12)
NEUTROS PCT: 78 % — AB (ref 43–77)
Neutro Abs: 4.2 10*3/uL (ref 1.7–7.7)
PLATELETS: 110 10*3/uL — AB (ref 150–400)
RBC: 3.53 MIL/uL — ABNORMAL LOW (ref 3.87–5.11)
RDW: 14 % (ref 11.5–15.5)
WBC: 5.4 10*3/uL (ref 4.0–10.5)

## 2013-12-13 LAB — BASIC METABOLIC PANEL
ANION GAP: 13 (ref 5–15)
BUN: 44 mg/dL — AB (ref 6–23)
CALCIUM: 8.4 mg/dL (ref 8.4–10.5)
CHLORIDE: 102 meq/L (ref 96–112)
CO2: 21 mEq/L (ref 19–32)
CREATININE: 2.51 mg/dL — AB (ref 0.50–1.10)
GFR calc Af Amer: 20 mL/min — ABNORMAL LOW (ref >90)
GFR, EST NON AFRICAN AMERICAN: 17 mL/min — AB (ref >90)
Glucose, Bld: 186 mg/dL — ABNORMAL HIGH (ref 70–99)
Potassium: 4.6 mEq/L (ref 3.7–5.3)
Sodium: 136 mEq/L — ABNORMAL LOW (ref 137–147)

## 2013-12-13 LAB — PROTIME-INR
INR: 1.18 (ref 0.00–1.49)
PROTHROMBIN TIME: 15.1 s (ref 11.6–15.2)

## 2013-12-13 LAB — TSH: TSH: 4.89 u[IU]/mL — AB (ref 0.350–4.500)

## 2013-12-13 LAB — HEMOGLOBIN A1C
Hgb A1c MFr Bld: 7 % — ABNORMAL HIGH (ref <5.7)
MEAN PLASMA GLUCOSE: 154 mg/dL — AB (ref <117)

## 2013-12-13 LAB — CBG MONITORING, ED: GLUCOSE-CAPILLARY: 161 mg/dL — AB (ref 70–99)

## 2013-12-13 MED ORDER — HYDROCODONE-ACETAMINOPHEN 5-325 MG PO TABS
0.5000 | ORAL_TABLET | ORAL | Status: DC | PRN
  Administered 2013-12-14 – 2013-12-15 (×4): 0.5 via ORAL
  Filled 2013-12-13 (×4): qty 1

## 2013-12-13 MED ORDER — ACETAMINOPHEN 650 MG RE SUPP: 650.0000 mg | Freq: Four times a day (QID) | RECTAL | Status: DC | PRN

## 2013-12-13 MED ORDER — SODIUM CHLORIDE 0.9 % IJ SOLN
3.0000 mL | Freq: Two times a day (BID) | INTRAMUSCULAR | Status: DC
  Administered 2013-12-15 (×2): 3 mL via INTRAVENOUS

## 2013-12-13 MED ORDER — SODIUM CHLORIDE 0.9 % IV BOLUS (SEPSIS): 1000.0000 mL | Freq: Once | INTRAVENOUS | Status: DC

## 2013-12-13 MED ORDER — ACETAMINOPHEN 325 MG PO TABS
650.0000 mg | ORAL_TABLET | Freq: Four times a day (QID) | ORAL | Status: DC | PRN
  Administered 2013-12-13: 650 mg via ORAL
  Filled 2013-12-13: qty 2

## 2013-12-13 MED ORDER — HYDROCODONE-ACETAMINOPHEN 5-325 MG PO TABS: 1.0000 | ORAL_TABLET | ORAL | Status: DC | PRN

## 2013-12-13 MED ORDER — MORPHINE SULFATE 2 MG/ML IJ SOLN: 1.0000 mg | INTRAMUSCULAR | Status: DC | PRN

## 2013-12-13 MED ORDER — SODIUM CHLORIDE 0.9 % IV SOLN
Freq: Once | INTRAVENOUS | Status: AC
  Administered 2013-12-13: 13:00:00 via INTRAVENOUS

## 2013-12-13 MED ORDER — ASPIRIN 81 MG PO CHEW
81.0000 mg | CHEWABLE_TABLET | Freq: Two times a day (BID) | ORAL | Status: DC
  Administered 2013-12-13 – 2013-12-16 (×7): 81 mg via ORAL
  Filled 2013-12-13 (×8): qty 1

## 2013-12-13 MED ORDER — DEXTROSE 5 % IV SOLN
1.0000 g | INTRAVENOUS | Status: DC
  Administered 2013-12-13 – 2013-12-15 (×3): 1 g via INTRAVENOUS
  Filled 2013-12-13 (×4): qty 10

## 2013-12-13 MED ORDER — SODIUM CHLORIDE 0.9 % IV SOLN
INTRAVENOUS | Status: DC
  Administered 2013-12-14: 02:00:00 via INTRAVENOUS
  Administered 2013-12-14: 1000 mL via INTRAVENOUS
  Administered 2013-12-15: 06:00:00 via INTRAVENOUS

## 2013-12-13 MED ORDER — HEPARIN SODIUM (PORCINE) 5000 UNIT/ML IJ SOLN
5000.0000 [IU] | Freq: Three times a day (TID) | INTRAMUSCULAR | Status: DC
  Administered 2013-12-13 – 2013-12-16 (×9): 5000 [IU] via SUBCUTANEOUS
  Filled 2013-12-13 (×12): qty 1

## 2013-12-13 MED ORDER — METOPROLOL TARTRATE 12.5 MG HALF TABLET
12.5000 mg | ORAL_TABLET | Freq: Two times a day (BID) | ORAL | Status: DC
  Administered 2013-12-13 – 2013-12-16 (×7): 12.5 mg via ORAL
  Filled 2013-12-13 (×8): qty 1

## 2013-12-13 MED ORDER — VITAMIN D (ERGOCALCIFEROL) 1.25 MG (50000 UNIT) PO CAPS
50000.0000 [IU] | ORAL_CAPSULE | ORAL | Status: DC
  Administered 2013-12-16: 50000 [IU] via ORAL
  Filled 2013-12-13: qty 1

## 2013-12-13 MED ORDER — LEVOTHYROXINE SODIUM 100 MCG PO TABS
100.0000 ug | ORAL_TABLET | Freq: Every day | ORAL | Status: DC
  Administered 2013-12-14: 100 ug via ORAL
  Filled 2013-12-13 (×2): qty 1

## 2013-12-13 MED ORDER — SODIUM CHLORIDE 0.9 % IV BOLUS (SEPSIS)
500.0000 mL | Freq: Once | INTRAVENOUS | Status: AC
  Administered 2013-12-13: 500 mL via INTRAVENOUS

## 2013-12-13 NOTE — Progress Notes (Signed)
Pt admitted to the unit at 1455. Pt mental status is A&Ox4. Pt oriented to room, staff, and call bell. Skin is intact. Full assessment charted in [facility]. Call bell within reach. Visitor guidelines reviewed w/ pt and/or family.

## 2013-12-13 NOTE — ED Provider Notes (Signed)
CSN: [id number]     Arrival date & time 12/13/13  1032 History   First MD Initiated Contact with Patient 12/13/13 1044     Chief Complaint  Patient presents with  . Fall  . Dizziness     (Consider location/radiation/quality/duration/timing/severity/associated sxs/prior Treatment) HPI  80 year old female presents with dizziness 1 week and 3 falls this morning. She states that it feels like she is off balance when she stands up. She's not have symptoms at this moment. There's been no confusion or altered mental status per the daughter. The patient has never had dizziness like this before. She went [facility] was diagnosed with UTI and sent home. At that time she also had a renal stone study that showed no acute abnormalities. The patient denies any chest pain or short of breath. No current headaches. States it does not feel like she is near syncopal  Past Medical History  Diagnosis Date  . Hypothyroidism   . CAD (coronary artery disease)     NSTEMI 04/2011 with newly diagnosed 3V CAD s/p PCI/DES mLAD 04/11/11 with residual dz (per Dr. Burt Knack, should have consideration of PCI of the occluded RCA based on symptoms and/or consideration of outpatient nuclear perfusion testing after the patient recovers from her infarct)  . Ischemic cardiomyopathy     EF 45% by cath 04/20/11, 50-55% by echo 04/22/11. hypotension limiting medication titration   . Hyperlipidemia   . Hyperglycemia   . Hypertension   . Back pain, chronic    Past Surgical History  Procedure Laterality Date  . Breast lumpectomy      right  . Coronary stent placement    . Back surgery    . Left heart catheterization with coronary angiogram N/A 04/20/2011    Procedure: LEFT HEART CATHETERIZATION WITH CORONARY ANGIOGRAM;  Surgeon: Burnell Blanks, MD;  Location: [facility] CATH LAB;  Service: Cardiovascular;  Laterality: N/A;  . Percutaneous coronary stent intervention (pci-s) N/A 04/21/2011    Procedure: PERCUTANEOUS CORONARY STENT  INTERVENTION (PCI-S);  Surgeon: Sherren Mocha, MD;  Location: [facility] CATH LAB;  Service: Cardiovascular;  Laterality: N/A;   Family History  Problem Relation Age of Onset  . Other      no known family cardiac disease   History  Substance Use Topics  . Smoking status: Never Smoker   . Smokeless tobacco: Never Used  . Alcohol Use: No   OB History    No data available     Review of Systems  Constitutional: Negative for fever.  Cardiovascular: Positive for chest pain (after falling on left side).  Gastrointestinal: Negative for vomiting and abdominal pain.  Genitourinary: Positive for hematuria (microscopic). Negative for dysuria.  Neurological: Positive for dizziness. Negative for speech difficulty, weakness, numbness and headaches.  Psychiatric/Behavioral: Negative for confusion.  All other systems reviewed and are negative.     Allergies  Lipitor and Penicillins  Home Medications   Prior to Admission medications   Medication Sig Start Date End Date Taking? Authorizing Provider  aspirin 81 MG chewable tablet Chew 81 mg by mouth 2 (two) times daily.     Historical Provider, MD  cephALEXin (KEFLEX) 500 MG capsule Take 1 capsule (500 mg total) by mouth 4 (four) times daily. 12/11/13   Orpah Greek, MD  cholecalciferol (VITAMIN D) 1000 UNITS tablet Take 1,000 Units by mouth See admin instructions. Takes one daily except on Tuesdays and Fridays    Historical Provider, MD  fish oil-omega-3 fatty acids 1000 MG capsule Take 1 g  by mouth daily.    Historical Provider, MD  HYDROcodone-acetaminophen (NORCO/VICODIN) 5-325 MG per tablet Take 0.5 tablets by mouth every 4 (four) hours as needed for moderate pain. 12/11/13   Orpah Greek, MD  levothyroxine (SYNTHROID, LEVOTHROID) 100 MCG tablet Take 100 mcg by mouth every morning. 12/10/13   Historical Provider, MD  lisinopril (PRINIVIL,ZESTRIL) 2.5 MG tablet TAKE ONE (1) TABLET EACH DAY 03/15/13   Minus Breeding, MD  meclizine  (ANTIVERT) 25 MG tablet Take 25 mg by mouth 4 (four) times daily as needed. Dizziness    Historical Provider, MD  metoprolol tartrate (LOPRESSOR) 25 MG tablet Take 12.5 mg by mouth 2 (two) times daily. 04/23/11   Dayna N Dunn, PA-C  nitroGLYCERIN (NITROSTAT) 0.4 MG SL tablet Place 0.4 mg under the tongue every 5 (five) minutes x 3 doses as needed. For chest pain. 04/23/11   Dayna N Dunn, PA-C  Vitamin D, Ergocalciferol, (DRISDOL) 50000 UNITS CAPS Take 50,000 Units by mouth 2 (two) times a week. Tuesdays and Fridays 05/30/12   Historical Provider, MD   BP 149/52 mmHg  Pulse 93  Temp(Src) 98.5 F (36.9 C) (Oral)  Resp 20  Ht 5\' 2"  (1.575 m)  Wt 130 lb (58.968 kg)  BMI 23.77 kg/m2  SpO2 97% Physical Exam  Constitutional: She is oriented to person, place, and time. She appears well-developed and well-nourished.  HENT:  Head: Normocephalic and atraumatic.  Right Ear: External ear normal.  Left Ear: External ear normal.  Nose: Nose normal.  Eyes: EOM are normal. Pupils are equal, round, and reactive to light. Right eye exhibits no discharge. Left eye exhibits no discharge.  Cardiovascular: Normal rate, regular rhythm and normal heart sounds.   Pulmonary/Chest: Effort normal and breath sounds normal. She has no wheezes. She has no rales.  Abdominal: Soft. She exhibits no distension. There is no tenderness.  Neurological: She is alert and oriented to person, place, and time.  CN 2-12 grossly intact. 5/5 strength in all 4 extremities. Normal finger to nose  Skin: Skin is warm and dry.  Nursing note and vitals reviewed.   ED Course  Procedures (including critical care time) Labs Review Labs Reviewed  BASIC METABOLIC PANEL - Abnormal; Notable for the following:    Sodium 136 (*)    Glucose, Bld 186 (*)    BUN 44 (*)    Creatinine, Ser 2.51 (*)    GFR calc non Af Amer 17 (*)    GFR calc Af Amer 20 (*)    All other components within normal limits  URINALYSIS, ROUTINE W REFLEX MICROSCOPIC -  Abnormal; Notable for the following:    APPearance CLOUDY (*)    Hgb urine dipstick MODERATE (*)    Protein, ur 30 (*)    Leukocytes, UA LARGE (*)    All other components within normal limits  CBC WITH DIFFERENTIAL - Abnormal; Notable for the following:    RBC 3.53 (*)    Hemoglobin 10.8 (*)    HCT 32.2 (*)    Platelets 110 (*)    Neutrophils Relative % 78 (*)    Lymphocytes Relative 7 (*)    Lymphs Abs 0.4 (*)    Monocytes Relative 14 (*)    All other components within normal limits  URINE MICROSCOPIC-ADD ON - Abnormal; Notable for the following:    Squamous Epithelial / LPF FEW (*)    Bacteria, UA FEW (*)    All other components within normal limits  CBG MONITORING, ED -  Abnormal; Notable for the following:    Glucose-Capillary 161 (*)    All other components within normal limits    Imaging Review Dg Chest 2 View  12/13/2013   CLINICAL DATA:  [place] twice last week.  Left sided chest pain.  EXAM: CHEST  2 VIEW  COMPARISON:  04/25/2011  FINDINGS: The cardiac silhouette, mediastinal and hilar contours are within normal limits and stable. There is tortuosity and calcification of the thoracic aorta. The lungs are clear of acute process. Low lung volumes with minimal streaky basilar atelectasis. The bony thorax is grossly intact. No definite rib or vertebral body fractures.  IMPRESSION: No acute cardiopulmonary findings and grossly intact bony thorax.   Electronically Signed   By: Kalman Jewels M.D.   On: 12/13/2013 11:36   Ct Head Wo Contrast  12/13/2013   CLINICAL DATA:  [place] this morning.  Hit head.  Confusion.  EXAM: CT HEAD WITHOUT CONTRAST  TECHNIQUE: Contiguous axial images were obtained from the base of the skull through the vertex without intravenous contrast.  COMPARISON:  None.  FINDINGS: The ventricles are normal in size and configuration. No extra-axial fluid collections are identified. The gray-white differentiation is normal. No CT findings for acute intracranial process  such as hemorrhage or infarction. No mass lesions. The brainstem and cerebellum are grossly normal.  The bony structures are intact. The paranasal sinuses and mastoid air cells are clear. The globes are intact.  IMPRESSION: No acute intracranial findings or mass lesion and no acute skull fracture.   Electronically Signed   By: Kalman Jewels M.D.   On: 12/13/2013 11:45   Ct Renal Stone Study  12/11/2013   CLINICAL DATA:  Left flank pain, low back pain, microscopic hematuria for 1 month  EXAM: CT ABDOMEN AND PELVIS WITHOUT CONTRAST  TECHNIQUE: Multidetector CT imaging of the abdomen and pelvis was performed following the standard protocol without IV contrast.  COMPARISON:  CT lumbar spine 9/ 2/15  FINDINGS: Sagittal images of the spine shows stable compression fracture upper endplate of L2 vertebral body. Again noted diffuse osteopenia and degenerative changes thoracolumbar spine. Significant disc space flattening with vacuum disc phenomenon at L4-L5 and L5-S1 level.  The lung bases are unremarkable.  Small hiatal hernia. Unenhanced liver shows no biliary ductal dilatation. No calcified gallstones are noted within gallbladder. There is mild pancreatic atrophy. The spleen and adrenal glands are unremarkable.  There is mild right hydronephrosis and proximal right hydroureter. Mild thickening of proximal ureteral wall. From the level of right iliac vein the right ureter is small caliber. No calcified ureteral calculi are noted bilaterally. Although findings may be due to upper right urinary tract inflammation a right mid ureteral stricture or neoplastic process cannot be excluded. Correlation with urology exam enhanced study or retrograde study is recommended. No nephrolithiasis. Bilateral distal ureter is unremarkable.  No small bowel obstruction. Atherosclerotic calcifications of abdominal aorta and iliac arteries. Normal retrocecal appendix. No pericecal inflammation. The terminal ileum is unremarkable. Atrophic  uterus. Urinary bladder is unremarkable.  No aortic aneurysm  IMPRESSION: 1. There is mild right hydronephrosis and proximal right hydroureter. There is thickening of proximal right ureteral wall. Although findings may be due to right upper urinary tract inflammation a mid ureteral stricture or neoplastic process cannot be excluded. Correlation with urology exam, enhanced or retrograde study is recommended. 2. No nephrolithiasis. Bilateral no calcified ureteral calculi are noted. No calcified calculi are noted within urinary bladder. 3. Normal retrocecal appendix. 4. No small bowel obstruction.  5. Stable compression fracture of L2 vertebral body. 6. Atrophic uterus.   Electronically Signed   By: Lahoma Crocker M.D.   On: 12/11/2013 18:53     EKG Interpretation   Date/Time:  Friday December 13 2013 10:40:15 EST Ventricular Rate:  96 PR Interval:  184 QRS Duration: 79 QT Interval:  364 QTC Calculation: 460 R Axis:   -4 Text Interpretation:  Sinus rhythm Ventricular premature complex Anterior  infarct, old Baseline wander in lead(s) V3 T wave abnormality appears  improved compared to 2013 Confirmed by Maicee Ullman  MD, Francis ([id number]) on  12/13/2013 10:53:38 AM      MDM   Final diagnoses:  AKI (acute kidney injury)    She's not currently having any of her dizziness. No serious acute trauma from her falls. No evidence of rib fractures or intracranial abnormalities. Her kidney function is significant worse than baseline of 2013. Her creatinine was elevated a couple days ago but is even more elevated today. Even her dizziness this could all be dehydration. We'll gently rehydrate given her history of CHF. Patient does have continued pyuria on her urinalysis. I evaluated her urine culture from a couple days ago and there is no growth to date. She's currently on antibody. I will admit this patient to the hospitalist, at this time he recommends holding off on antibiotics and he will further evaluate  patient.    Ephraim Hamburger, MD 12/13/13 [phone]

## 2013-12-13 NOTE — ED Notes (Signed)
MD at bedside. 

## 2013-12-13 NOTE — ED Notes (Signed)
Pt reports falling x3 this morning.  Pt states she hit her head on the left frontal-temporal area.  Pt c/o left sided pain rin ribs, thigh.  Pt c/o chronic back and leg pain.  Pt treated at [facility] on 12-11-13 for UTI, sent home with Keflex and Vicodin.  A&O x4, EMS reports some confusion on ambulance.  Daughter at bedside reports no confusion.   NAD noted at this time.  No contusions visible. Full ROM.

## 2013-12-13 NOTE — H&P (Signed)
[facility] History and Physical  Joann Hunter RDE:[id number] DOB: 1932-04-05 DOA: 12/13/2013  Referring physician: EDP PCP: Octavio Graves, DO   Chief Complaint: Dizziness and fall  HPI: Joann Hunter is a 80 y.o. female with past medical history of CAD, ischemic cardiomyopathy and hypothyroidism came into the hospital because of dizziness and she felt earlier today. Patient was complaining about dizziness for the past several days, getting worse overall, she went to [facility] on 12/9 and she was diagnosed with UTI, she was discharged on Keflex, apparently she wasn't getting better, and the dizziness got worse so she came back to the hospital after she fell today and hit her head. In the ED CT scan of the head was done and showed no evidence of acute events, creatinine is 2.5 which is  slightly worse than 2 days ago. Urinalysis is still showing TNTC WBCs.   Review of Systems:  Constitutional: negative for anorexia, fevers and sweats Eyes: negative for irritation, redness and visual disturbance Ears, nose, mouth, throat, and face: negative for earaches, epistaxis, nasal congestion and sore throat Respiratory: negative for cough, dyspnea on exertion, sputum and wheezing Cardiovascular: negative for chest pain, dyspnea, lower extremity edema, orthopnea, palpitations and syncope Gastrointestinal: negative for abdominal pain, constipation, diarrhea, melena, nausea and vomiting Genitourinary:negative for dysuria, frequency and hematuria Hematologic/lymphatic: negative for bleeding, easy bruising and lymphadenopathy Musculoskeletal:negative for arthralgias, muscle weakness and stiff joints Neurological: negative for coordination problems, gait problems, headaches and weakness Endocrine: negative for diabetic symptoms including polydipsia, polyuria and weight loss Allergic/Immunologic: negative for anaphylaxis, hay fever and urticaria  Past Medical History  Diagnosis  Date  . Hypothyroidism   . CAD (coronary artery disease)     NSTEMI 04/2011 with newly diagnosed 3V CAD s/p PCI/DES mLAD 04/11/11 with residual dz (per Dr. Burt Knack, should have consideration of PCI of the occluded RCA based on symptoms and/or consideration of outpatient nuclear perfusion testing after the patient recovers from her infarct)  . Ischemic cardiomyopathy     EF 45% by cath 04/20/11, 50-55% by echo 04/22/11. hypotension limiting medication titration   . Hyperlipidemia   . Hyperglycemia   . Hypertension   . Back pain, chronic    Past Surgical History  Procedure Laterality Date  . Breast lumpectomy      right  . Coronary stent placement    . Back surgery    . Left heart catheterization with coronary angiogram N/A 04/20/2011    Procedure: LEFT HEART CATHETERIZATION WITH CORONARY ANGIOGRAM;  Surgeon: Burnell Blanks, MD;  Location: [facility] CATH LAB;  Service: Cardiovascular;  Laterality: N/A;  . Percutaneous coronary stent intervention (pci-s) N/A 04/21/2011    Procedure: PERCUTANEOUS CORONARY STENT INTERVENTION (PCI-S);  Surgeon: Sherren Mocha, MD;  Location: [facility] CATH LAB;  Service: Cardiovascular;  Laterality: N/A;   Social History:   reports that she has never smoked. She has never used smokeless tobacco. She reports that she does not drink alcohol or use illicit drugs.  Allergies  Allergen Reactions  . Lipitor [Atorvastatin]   . Penicillins Rash    Family History  Problem Relation Age of Onset  . Other      no known family cardiac disease     Prior to Admission medications   Medication Sig Start Date End Date Taking? Authorizing Provider  aspirin 81 MG chewable tablet Chew 81 mg by mouth 2 (two) times daily.    Yes Historical Provider, MD  cephALEXin (KEFLEX) 500 MG capsule Take 1 capsule (  500 mg total) by mouth 4 (four) times daily. 12/11/13  Yes Orpah Greek, MD  fish oil-omega-3 fatty acids 1000 MG capsule Take 1 g by mouth daily.   Yes Historical Provider,  MD  HYDROcodone-acetaminophen (NORCO/VICODIN) 5-325 MG per tablet Take 0.5 tablets by mouth every 4 (four) hours as needed for moderate pain. 12/11/13  Yes Orpah Greek, MD  levothyroxine (SYNTHROID, LEVOTHROID) 100 MCG tablet Take 100 mcg by mouth every morning. 12/10/13  Yes Historical Provider, MD  meclizine (ANTIVERT) 25 MG tablet Take 25 mg by mouth 4 (four) times daily as needed. Dizziness   Yes Historical Provider, MD  metoprolol tartrate (LOPRESSOR) 25 MG tablet Take 12.5 mg by mouth 2 (two) times daily. 04/23/11  Yes Dayna N Dunn, PA-C  ondansetron (ZOFRAN-ODT) 4 MG disintegrating tablet Take 4 mg by mouth every 8 (eight) hours as needed for nausea or vomiting.   Yes Historical Provider, MD  Vitamin D, Ergocalciferol, (DRISDOL) 50000 UNITS CAPS Take 50,000 Units by mouth 2 (two) times a week. Tuesdays and Thurdays 05/30/12  Yes Historical Provider, MD  cholecalciferol (VITAMIN D) 1000 UNITS tablet Take 1,000 Units by mouth See admin instructions. Takes one daily except on Tuesdays and Thursdays    Historical Provider, MD  lisinopril (PRINIVIL,ZESTRIL) 2.5 MG tablet TAKE ONE (1) TABLET EACH DAY Patient not taking: Reported on 12/13/2013 03/15/13   Minus Breeding, MD  nitroGLYCERIN (NITROSTAT) 0.4 MG SL tablet Place 0.4 mg under the tongue every 5 (five) minutes x 3 doses as needed. For chest pain. 04/23/11   Charlie Pitter, PA-C   Physical Exam: Filed Vitals:   12/13/13 1315  BP: 143/48  Pulse: 91  Temp:   Resp: 22   Constitutional: Oriented to person, place, and time. Well-developed and well-nourished. Cooperative.  Head: Normocephalic and atraumatic.  Nose: Nose normal.  Mouth/Throat: Uvula is midline, oropharynx is clear and moist and mucous membranes are normal.  Eyes: Conjunctivae and EOM are normal. Pupils are equal, round, and reactive to light.  Neck: Trachea normal and normal range of motion. Neck supple.  Cardiovascular: Normal rate, regular rhythm, S1 normal, S2 normal,  normal heart sounds and intact distal pulses.   Pulmonary/Chest: Effort normal and breath sounds normal.  Abdominal: Soft. Bowel sounds are normal. There is no hepatosplenomegaly. There is no tenderness.  Musculoskeletal: Normal range of motion.  Neurological: Alert and oriented to person, place, and time. Has normal strength. No cranial nerve deficit or sensory deficit.  Skin: Skin is warm, dry and intact.  Psychiatric: Has a normal mood and affect. Speech is normal and behavior is normal.   Labs on Admission:  Basic Metabolic Panel:  Recent Labs Lab 12/11/13 1709 12/13/13 1150  NA 135* 136*  K 4.5 4.6  CL 99 102  CO2 24 21  GLUCOSE 251* 186*  BUN 39* 44*  CREATININE 2.37* 2.51*  CALCIUM 8.8 8.4   Liver Function Tests: No results for input(s): AST, ALT, ALKPHOS, BILITOT, PROT, ALBUMIN in the last 168 hours. No results for input(s): LIPASE, AMYLASE in the last 168 hours. No results for input(s): AMMONIA in the last 168 hours. CBC:  Recent Labs Lab 12/11/13 1709 12/13/13 1150  WBC 8.7 5.4  NEUTROABS 7.1 4.2  HGB 11.4* 10.8*  HCT 33.1* 32.2*  MCV 92.7 91.2  PLT 131* 110*   Cardiac Enzymes: No results for input(s): CKTOTAL, CKMB, CKMBINDEX, TROPONINI in the last 168 hours.  BNP (last 3 results) No results for input(s): PROBNP in the  last 8760 hours. CBG:  Recent Labs Lab 12/13/13 1153  GLUCAP 161*    Radiological Exams on Admission: Dg Chest 2 View  12/13/2013   CLINICAL DATA:  [place] twice last week.  Left sided chest pain.  EXAM: CHEST  2 VIEW  COMPARISON:  04/25/2011  FINDINGS: The cardiac silhouette, mediastinal and hilar contours are within normal limits and stable. There is tortuosity and calcification of the thoracic aorta. The lungs are clear of acute process. Low lung volumes with minimal streaky basilar atelectasis. The bony thorax is grossly intact. No definite rib or vertebral body fractures.  IMPRESSION: No acute cardiopulmonary findings and grossly  intact bony thorax.   Electronically Signed   By: Kalman Jewels M.D.   On: 12/13/2013 11:36   Ct Head Wo Contrast  12/13/2013   CLINICAL DATA:  [place] this morning.  Hit head.  Confusion.  EXAM: CT HEAD WITHOUT CONTRAST  TECHNIQUE: Contiguous axial images were obtained from the base of the skull through the vertex without intravenous contrast.  COMPARISON:  None.  FINDINGS: The ventricles are normal in size and configuration. No extra-axial fluid collections are identified. The gray-white differentiation is normal. No CT findings for acute intracranial process such as hemorrhage or infarction. No mass lesions. The brainstem and cerebellum are grossly normal.  The bony structures are intact. The paranasal sinuses and mastoid air cells are clear. The globes are intact.  IMPRESSION: No acute intracranial findings or mass lesion and no acute skull fracture.   Electronically Signed   By: Kalman Jewels M.D.   On: 12/13/2013 11:45   Ct Renal Stone Study  12/11/2013   CLINICAL DATA:  Left flank pain, low back pain, microscopic hematuria for 1 month  EXAM: CT ABDOMEN AND PELVIS WITHOUT CONTRAST  TECHNIQUE: Multidetector CT imaging of the abdomen and pelvis was performed following the standard protocol without IV contrast.  COMPARISON:  CT lumbar spine 9/ 2/15  FINDINGS: Sagittal images of the spine shows stable compression fracture upper endplate of L2 vertebral body. Again noted diffuse osteopenia and degenerative changes thoracolumbar spine. Significant disc space flattening with vacuum disc phenomenon at L4-L5 and L5-S1 level.  The lung bases are unremarkable.  Small hiatal hernia. Unenhanced liver shows no biliary ductal dilatation. No calcified gallstones are noted within gallbladder. There is mild pancreatic atrophy. The spleen and adrenal glands are unremarkable.  There is mild right hydronephrosis and proximal right hydroureter. Mild thickening of proximal ureteral wall. From the level of right iliac vein  the right ureter is small caliber. No calcified ureteral calculi are noted bilaterally. Although findings may be due to upper right urinary tract inflammation a right mid ureteral stricture or neoplastic process cannot be excluded. Correlation with urology exam enhanced study or retrograde study is recommended. No nephrolithiasis. Bilateral distal ureter is unremarkable.  No small bowel obstruction. Atherosclerotic calcifications of abdominal aorta and iliac arteries. Normal retrocecal appendix. No pericecal inflammation. The terminal ileum is unremarkable. Atrophic uterus. Urinary bladder is unremarkable.  No aortic aneurysm  IMPRESSION: 1. There is mild right hydronephrosis and proximal right hydroureter. There is thickening of proximal right ureteral wall. Although findings may be due to right upper urinary tract inflammation a mid ureteral stricture or neoplastic process cannot be excluded. Correlation with urology exam, enhanced or retrograde study is recommended. 2. No nephrolithiasis. Bilateral no calcified ureteral calculi are noted. No calcified calculi are noted within urinary bladder. 3. Normal retrocecal appendix. 4. No small bowel obstruction. 5. Stable compression fracture of  L2 vertebral body. 6. Atrophic uterus.   Electronically Signed   By: Lahoma Crocker M.D.   On: 12/11/2013 18:53    EKG: Independently reviewed.   Assessment/Plan Principal Problem:   UTI (lower urinary tract infection) Active Problems:   Syncope, near   AKI (acute kidney injury)   Fall   CKD (chronic kidney disease), stage III    UTI Escherichia coli UTI, patient was discharged home on Keflex from 12/9, not improving. Admitted to the hospital, started on Rocephin. We'll adjust antibiotics according to culture results.  Fall She is complaining about dizziness and generalized weakness for the past several days likely secondary to UTI. CT scan of the head did not show intracranial bleeding or fractures. Will  ambulate with caution.  CKD stage III Patient has chronic kidney disease with elevated creatinine since 2013. Presented with creatinine of 2.5, 2 days ago with was 2.3. We'll hydrate patient with IV fluids, check BMP in a.m.  Near syncope The dizziness is likely secondary to UTI, we'll continue treatment with antibiotics and evaluate afterwards.   Code Status: Full code Family Communication: Patient seen with to Francesco Sor and [name] at bedside. Disposition Plan: MedSurg, inpatient  Time spent: 26 minutes  [facility] Pager [phone]

## 2013-12-14 DIAGNOSIS — A419 Sepsis, unspecified organism: Secondary | ICD-10-CM | POA: Diagnosis present

## 2013-12-14 DIAGNOSIS — A4151 Sepsis due to Escherichia coli [E. coli]: Secondary | ICD-10-CM

## 2013-12-14 DIAGNOSIS — W19XXXD Unspecified fall, subsequent encounter: Secondary | ICD-10-CM

## 2013-12-14 LAB — GLUCOSE, CAPILLARY
GLUCOSE-CAPILLARY: 242 mg/dL — AB (ref 70–99)
Glucose-Capillary: 263 mg/dL — ABNORMAL HIGH (ref 70–99)
Glucose-Capillary: 223 mg/dL — ABNORMAL HIGH (ref 70–99)

## 2013-12-14 LAB — COMPREHENSIVE METABOLIC PANEL
ALBUMIN: 2 g/dL — AB (ref 3.5–5.2)
ALK PHOS: 84 U/L (ref 39–117)
ALT: 10 U/L (ref 0–35)
AST: 13 U/L (ref 0–37)
Anion gap: 12 (ref 5–15)
BUN: 36 mg/dL — ABNORMAL HIGH (ref 6–23)
CHLORIDE: 106 meq/L (ref 96–112)
CO2: 20 mEq/L (ref 19–32)
Calcium: 7.8 mg/dL — ABNORMAL LOW (ref 8.4–10.5)
Creatinine, Ser: 1.92 mg/dL — ABNORMAL HIGH (ref 0.50–1.10)
GFR calc Af Amer: 27 mL/min — ABNORMAL LOW (ref >90)
GFR, EST NON AFRICAN AMERICAN: 24 mL/min — AB (ref >90)
Glucose, Bld: 187 mg/dL — ABNORMAL HIGH (ref 70–99)
Potassium: 4.3 mEq/L (ref 3.7–5.3)
SODIUM: 138 meq/L (ref 137–147)
Total Bilirubin: 0.3 mg/dL (ref 0.3–1.2)
Total Protein: 5.3 g/dL — ABNORMAL LOW (ref 6.0–8.3)

## 2013-12-14 LAB — URINE CULTURE

## 2013-12-14 MED ORDER — LEVOTHYROXINE SODIUM 88 MCG PO TABS
113.0000 ug | ORAL_TABLET | Freq: Every day | ORAL | Status: DC
  Administered 2013-12-15 – 2013-12-16 (×2): 113 ug via ORAL
  Filled 2013-12-14 (×3): qty 1

## 2013-12-14 MED ORDER — INSULIN ASPART 100 UNIT/ML ~~LOC~~ SOLN
0.0000 [IU] | Freq: Three times a day (TID) | SUBCUTANEOUS | Status: DC
  Administered 2013-12-14: 3 [IU] via SUBCUTANEOUS
  Administered 2013-12-14: 5 [IU] via SUBCUTANEOUS
  Administered 2013-12-15: 2 [IU] via SUBCUTANEOUS
  Administered 2013-12-15: 7 [IU] via SUBCUTANEOUS
  Administered 2013-12-15 – 2013-12-16 (×2): 2 [IU] via SUBCUTANEOUS
  Administered 2013-12-16: 3 [IU] via SUBCUTANEOUS

## 2013-12-14 NOTE — H&P (Deleted)
[facility] PROGRESS NOTE   Joann Hunter WNU:[id number] DOB: 1932-06-02 DOA: 12/13/2013 PCP: Octavio Graves, DO  HPI/Subjective: Had fever of 101.9 last night, still feels weak.  Assessment/Plan: Principal Problem:   Sepsis Active Problems:   Syncope, near   AKI (acute kidney injury)   Fall   UTI (lower urinary tract infection)   CKD (chronic kidney disease), stage III   UTI Escherichia coli UTI, patient was discharged home on Keflex from 12/9, not improving. Admitted to the hospital, started on Rocephin. We'll adjust antibiotics according to culture results.  Sepsis Early sepsis secondary to UTI with fever of 101.9, heart rate of 102 in presence of UTI. Hydrate with IV fluids and treat UTI with IV antibiotics.  Fall She is complaining about dizziness and generalized weakness for the past several days likely secondary to UTI. CT scan of the head did not show intracranial bleeding or fractures. Was very orthostatic with blood pressure of 170/72 and when she gets up at 107/84.  CKD stage III Patient has chronic kidney disease with elevated creatinine since 2013. Presented with creatinine of 2.5, 2 days ago with was 2.3. This is slightly improved to 1.92 after IV fluid hydration, continue hydration with IV fluids. We'll hydrate patient with IV fluids, check BMP in a.m.  Near syncope The dizziness is likely secondary to UTI, patient is very orthostatic. Hydrate with IV fluids.  Code Status: Full code Family Communication: Plan discussed with the patient. Disposition Plan: Remains inpatient   Consultants:  None  Procedures:  None  Antibiotics:  Rocephin   Objective: Filed Vitals:   12/14/13 0928  BP: 152/54  Pulse: 80  Temp:   Resp:     Intake/Output Summary (Last 24 hours) at 12/14/13 1041 Last data filed at 12/14/13 1015  Gross per 24 hour  Intake   1730 ml  Output    800 ml  Net    930 ml   Filed Weights   12/13/13 1050 12/13/13  1519  Weight: 58.968 kg (130 lb) 58.968 kg (130 lb)    Exam: General: Alert and awake, oriented x3, not in any acute distress. HEENT: anicteric sclera, pupils reactive to light and accommodation, EOMI CVS: S1-S2 clear, no murmur rubs or gallops Chest: clear to auscultation bilaterally, no wheezing, rales or rhonchi Abdomen: soft nontender, nondistended, normal bowel sounds, no organomegaly Extremities: no cyanosis, clubbing or edema noted bilaterally Neuro: Cranial nerves II-XII intact, no focal neurological deficits  Data Reviewed: Basic Metabolic Panel:  Recent Labs Lab 12/11/13 1709 12/13/13 1150 12/14/13 0439  NA 135* 136* 138  K 4.5 4.6 4.3  CL 99 102 106  CO2 24 21 20   GLUCOSE 251* 186* 187*  BUN 39* 44* 36*  CREATININE 2.37* 2.51* 1.92*  CALCIUM 8.8 8.4 7.8*   Liver Function Tests:  Recent Labs Lab 12/14/13 0439  AST 13  ALT 10  ALKPHOS 84  BILITOT 0.3  PROT 5.3*  ALBUMIN 2.0*   No results for input(s): LIPASE, AMYLASE in the last 168 hours. No results for input(s): AMMONIA in the last 168 hours. CBC:  Recent Labs Lab 12/11/13 1709 12/13/13 1150  WBC 8.7 5.4  NEUTROABS 7.1 4.2  HGB 11.4* 10.8*  HCT 33.1* 32.2*  MCV 92.7 91.2  PLT 131* 110*   Cardiac Enzymes: No results for input(s): CKTOTAL, CKMB, CKMBINDEX, TROPONINI in the last 168 hours. BNP (last 3 results) No results for input(s): PROBNP in the last 8760 hours. CBG:  Recent Labs Lab 12/13/13 1153  GLUCAP 161*    Micro Recent Results (from the past 240 hour(s))  Urine culture     Status: None (Preliminary result)   Collection Time: 12/11/13  7:17 PM  Result Value Ref Range Status   Specimen Description URINE, CLEAN CATCH  Final   Special Requests NONE  Final   Culture  Setup Time   Final    12/12/2013 14:11 Performed at [facility]   Final    >=100,000 COLONIES/ML Performed at [facility]    Culture   Final    ESCHERICHIA COLI Performed  at [facility]    Report Status PENDING  Incomplete     Studies: Dg Chest 2 View  12/13/2013   CLINICAL DATA:  [place] twice last week.  Left sided chest pain.  EXAM: CHEST  2 VIEW  COMPARISON:  04/25/2011  FINDINGS: The cardiac silhouette, mediastinal and hilar contours are within normal limits and stable. There is tortuosity and calcification of the thoracic aorta. The lungs are clear of acute process. Low lung volumes with minimal streaky basilar atelectasis. The bony thorax is grossly intact. No definite rib or vertebral body fractures.  IMPRESSION: No acute cardiopulmonary findings and grossly intact bony thorax.   Electronically Signed   By: Kalman Jewels M.D.   On: 12/13/2013 11:36   Ct Head Wo Contrast  12/13/2013   CLINICAL DATA:  [place] this morning.  Hit head.  Confusion.  EXAM: CT HEAD WITHOUT CONTRAST  TECHNIQUE: Contiguous axial images were obtained from the base of the skull through the vertex without intravenous contrast.  COMPARISON:  None.  FINDINGS: The ventricles are normal in size and configuration. No extra-axial fluid collections are identified. The gray-white differentiation is normal. No CT findings for acute intracranial process such as hemorrhage or infarction. No mass lesions. The brainstem and cerebellum are grossly normal.  The bony structures are intact. The paranasal sinuses and mastoid air cells are clear. The globes are intact.  IMPRESSION: No acute intracranial findings or mass lesion and no acute skull fracture.   Electronically Signed   By: Kalman Jewels M.D.   On: 12/13/2013 11:45    Scheduled Meds: . aspirin  81 mg Oral BID  . cefTRIAXone (ROCEPHIN)  IV  1 g Intravenous Q24H  . heparin  5,000 Units Subcutaneous 3 times per day  . levothyroxine  100 mcg Oral QAC breakfast  . metoprolol tartrate  12.5 mg Oral BID  . sodium chloride  3 mL Intravenous Q12H  . [START ON 12/16/2013] Vitamin D (Ergocalciferol)  50,000 Units Oral Once per day on Mon Thu    Continuous Infusions: . sodium chloride 75 mL/hr at 12/14/13 0215       Time spent: 35 minutes    [facility] A  [facility] Pager [phone] If 7PM-7AM, please contact night-coverage at [email or web address], password [facility] 12/14/2013, 10:41 AM  LOS: 1 day

## 2013-12-15 ENCOUNTER — Telehealth: Payer: Self-pay | Admitting: Emergency Medicine

## 2013-12-15 ENCOUNTER — Inpatient Hospital Stay (HOSPITAL_COMMUNITY): Payer: Medicare Other

## 2013-12-15 LAB — CBC
HCT: 29.8 % — ABNORMAL LOW (ref 36.0–46.0)
Hemoglobin: 9.9 g/dL — ABNORMAL LOW (ref 12.0–15.0)
MCH: 30.8 pg (ref 26.0–34.0)
MCHC: 33.2 g/dL (ref 30.0–36.0)
MCV: 92.8 fL (ref 78.0–100.0)
PLATELETS: 115 10*3/uL — AB (ref 150–400)
RBC: 3.21 MIL/uL — AB (ref 3.87–5.11)
RDW: 14.4 % (ref 11.5–15.5)
WBC: 5 10*3/uL (ref 4.0–10.5)

## 2013-12-15 LAB — BASIC METABOLIC PANEL
ANION GAP: 11 (ref 5–15)
BUN: 26 mg/dL — AB (ref 6–23)
CO2: 21 mEq/L (ref 19–32)
Calcium: 7.6 mg/dL — ABNORMAL LOW (ref 8.4–10.5)
Chloride: 106 mEq/L (ref 96–112)
Creatinine, Ser: 1.6 mg/dL — ABNORMAL HIGH (ref 0.50–1.10)
GFR, EST AFRICAN AMERICAN: 34 mL/min — AB (ref >90)
GFR, EST NON AFRICAN AMERICAN: 30 mL/min — AB (ref >90)
Glucose, Bld: 168 mg/dL — ABNORMAL HIGH (ref 70–99)
POTASSIUM: 4.5 meq/L (ref 3.7–5.3)
SODIUM: 138 meq/L (ref 137–147)

## 2013-12-15 LAB — GLUCOSE, CAPILLARY
GLUCOSE-CAPILLARY: 311 mg/dL — AB (ref 70–99)
Glucose-Capillary: 164 mg/dL — ABNORMAL HIGH (ref 70–99)
Glucose-Capillary: 152 mg/dL — ABNORMAL HIGH (ref 70–99)
Glucose-Capillary: 189 mg/dL — ABNORMAL HIGH (ref 70–99)

## 2013-12-15 LAB — PRO B NATRIURETIC PEPTIDE: Pro B Natriuretic peptide (BNP): 2030 pg/mL — ABNORMAL HIGH (ref 0–450)

## 2013-12-15 MED ORDER — HYDROCODONE-ACETAMINOPHEN 5-325 MG PO TABS: 1.0000 | ORAL_TABLET | ORAL | Status: DC | PRN

## 2013-12-15 MED ORDER — LISINOPRIL 2.5 MG PO TABS
2.5000 mg | ORAL_TABLET | Freq: Every day | ORAL | Status: DC
  Administered 2013-12-15 – 2013-12-16 (×3): 2.5 mg via ORAL
  Filled 2013-12-15 (×2): qty 1

## 2013-12-15 MED ORDER — GUAIFENESIN-DM 100-10 MG/5ML PO SYRP
5.0000 mL | ORAL_SOLUTION | ORAL | Status: DC | PRN
  Filled 2013-12-15: qty 5

## 2013-12-15 MED ORDER — GUAIFENESIN ER 600 MG PO TB12
1200.0000 mg | ORAL_TABLET | Freq: Two times a day (BID) | ORAL | Status: DC
  Administered 2013-12-15 – 2013-12-16 (×3): 1200 mg via ORAL
  Filled 2013-12-15 (×4): qty 2

## 2013-12-15 NOTE — Progress Notes (Signed)
[facility] PROGRESS NOTE   Joann Hunter VHQ:[id number] DOB: [birth year] DOA: 12/13/2013 PCP: Octavio Graves, DO  HPI/Subjective: Fever curve is improving, patient is feeling better. Complains about some cough.  Assessment/Plan: Principal Problem:   Sepsis Active Problems:   Syncope, near   AKI (acute kidney injury)   Fall   UTI (lower urinary tract infection)   CKD (chronic kidney disease), stage III   UTI Escherichia coli UTI, patient was discharged home on Keflex from 12/9, not improving. Admitted to the hospital, started on Rocephin. We'll adjust antibiotics according to culture results.  Sepsis Early sepsis secondary to UTI with fever of 101.9, heart rate of 102 in presence of UTI. Hydrate with IV fluids and treat UTI with IV antibiotics.  Fall She is complaining about dizziness and generalized weakness for the past several days likely secondary to UTI. CT scan of the head did not show intracranial bleeding or fractures. Was very orthostatic with blood pressure of 170/72 and when she gets up at 107/84.  CKD stage III Patient has chronic kidney disease with elevated creatinine since 2013. Presented with creatinine of 2.5, 2 days ago with was 2.3. This is slightly improved to 1.92 after IV fluid hydration, continue hydration with IV fluids. We'll hydrate patient with IV fluids, check BMP in a.m.  Hypertension Blood pressures trending up likely with IV fluids, will stop IV fluids as patient improving. Restart lisinopril 2.5 mg.  Near syncope The dizziness is likely secondary to UTI, patient is very orthostatic.   Code Status: Full code Family Communication: Plan discussed with the patient. Disposition Plan: Remains inpatient   Consultants:  None  Procedures:  None  Antibiotics:  Rocephin   Objective: Filed Vitals:   12/15/13 0846  BP: 178/57  Pulse: 91  Temp:   Resp:     Intake/Output Summary (Last 24 hours) at 12/15/13  1214 Last data filed at 12/15/13 1123  Gross per 24 hour  Intake   1710 ml  Output    300 ml  Net   1410 ml   Filed Weights   12/13/13 1050 12/13/13 1519  Weight: 58.968 kg (130 lb) 58.968 kg (130 lb)    Exam: General: Alert and awake, oriented x3, not in any acute distress. HEENT: anicteric sclera, pupils reactive to light and accommodation, EOMI CVS: S1-S2 clear, no murmur rubs or gallops Chest: clear to auscultation bilaterally, no wheezing, rales or rhonchi Abdomen: soft nontender, nondistended, normal bowel sounds, no organomegaly Extremities: no cyanosis, clubbing or edema noted bilaterally Neuro: Cranial nerves II-XII intact, no focal neurological deficits  Data Reviewed: Basic Metabolic Panel:  Recent Labs Lab 12/11/13 1709 12/13/13 1150 12/14/13 0439 12/15/13 0507  NA 135* 136* 138 138  K 4.5 4.6 4.3 4.5  CL 99 102 106 106  CO2 24 21 20 21   GLUCOSE 251* 186* 187* 168*  BUN 39* 44* 36* 26*  CREATININE 2.37* 2.51* 1.92* 1.60*  CALCIUM 8.8 8.4 7.8* 7.6*   Liver Function Tests:  Recent Labs Lab 12/14/13 0439  AST 13  ALT 10  ALKPHOS 84  BILITOT 0.3  PROT 5.3*  ALBUMIN 2.0*   No results for input(s): LIPASE, AMYLASE in the last 168 hours. No results for input(s): AMMONIA in the last 168 hours. CBC:  Recent Labs Lab 12/11/13 1709 12/13/13 1150 12/15/13 0507  WBC 8.7 5.4 5.0  NEUTROABS 7.1 4.2  --   HGB 11.4* 10.8* 9.9*  HCT 33.1* 32.2* 29.8*  MCV 92.7 91.2 92.8  PLT 131*  110* 115*   Cardiac Enzymes: No results for input(s): CKTOTAL, CKMB, CKMBINDEX, TROPONINI in the last 168 hours. BNP (last 3 results) No results for input(s): PROBNP in the last 8760 hours. CBG:  Recent Labs Lab 12/14/13 1223 12/14/13 1702 12/14/13 2153 12/15/13 0740 12/15/13 1203  GLUCAP 263* 242* 223* 164* 311*    Micro Recent Results (from the past 240 hour(s))  Urine culture     Status: None   Collection Time: 12/11/13  7:17 PM  Result Value Ref Range  Status   Specimen Description URINE, CLEAN CATCH  Final   Special Requests NONE  Final   Culture  Setup Time   Final    12/12/2013 14:11 Performed at [place]   Final    >=100,000 COLONIES/ML Performed at [facility]    Culture   Final    ESCHERICHIA COLI Performed at [facility]    Report Status 12/14/2013 FINAL  Final   Organism ID, Bacteria ESCHERICHIA COLI  Final      Susceptibility   Escherichia coli - MIC*    AMPICILLIN <=2 SENSITIVE Sensitive     CEFAZOLIN <=4 SENSITIVE Sensitive     CEFTRIAXONE <=1 SENSITIVE Sensitive     CIPROFLOXACIN <=0.25 SENSITIVE Sensitive     GENTAMICIN <=1 SENSITIVE Sensitive     LEVOFLOXACIN <=0.12 SENSITIVE Sensitive     NITROFURANTOIN <=16 SENSITIVE Sensitive     TOBRAMYCIN <=1 SENSITIVE Sensitive     TRIMETH/SULFA <=20 SENSITIVE Sensitive     PIP/TAZO <=4 SENSITIVE Sensitive     * ESCHERICHIA COLI     Studies: No results found.  Scheduled Meds: . aspirin  81 mg Oral BID  . cefTRIAXone (ROCEPHIN)  IV  1 g Intravenous Q24H  . guaiFENesin  1,200 mg Oral BID  . heparin  5,000 Units Subcutaneous 3 times per day  . insulin aspart  0-9 Units Subcutaneous TID WC  . levothyroxine  113 mcg Oral QAC breakfast  . metoprolol tartrate  12.5 mg Oral BID  . sodium chloride  3 mL Intravenous Q12H  . [START ON 12/16/2013] Vitamin D (Ergocalciferol)  50,000 Units Oral Once per day on Mon Thu   Continuous Infusions: . sodium chloride 75 mL/hr at 12/15/13 0530       Time spent: 35 minutes    [facility] A  [facility] Pager [phone] If 7PM-7AM, please contact night-coverage at [email or web address], password [facility] 12/15/2013, 12:14 PM  LOS: 2 days

## 2013-12-15 NOTE — Progress Notes (Signed)
[facility] PROGRESS NOTE   Joann Hunter RWE:[id number] DOB: 1932/07/01 DOA: 12/13/2013 PCP: Octavio Graves, DO  HPI/Subjective: Had fever of 101.9 last night, still feels weak.  Assessment/Plan: Principal Problem:   Sepsis Active Problems:   Syncope, near   AKI (acute kidney injury)   Fall   UTI (lower urinary tract infection)   CKD (chronic kidney disease), stage III   UTI Escherichia coli UTI, patient was discharged home on Keflex from 12/9, not improving. Admitted to the hospital, started on Rocephin. We'll adjust antibiotics according to culture results.  Sepsis Early sepsis secondary to UTI with fever of 101.9, heart rate of 102 in presence of UTI. Hydrate with IV fluids and treat UTI with IV antibiotics.  Fall She is complaining about dizziness and generalized weakness for the past several days likely secondary to UTI. CT scan of the head did not show intracranial bleeding or fractures. Was very orthostatic with blood pressure of 170/72 and when she gets up at 107/84.  CKD stage III Patient has chronic kidney disease with elevated creatinine since 2013. Presented with creatinine of 2.5, 2 days ago with was 2.3. This is slightly improved to 1.92 after IV fluid hydration, continue hydration with IV fluids. We'll hydrate patient with IV fluids, check BMP in a.m.  Near syncope The dizziness is likely secondary to UTI, patient is very orthostatic. Hydrate with IV fluids.  Code Status: Full code Family Communication: Plan discussed with the patient. Disposition Plan: Remains inpatient   Consultants:  None  Procedures:  None  Antibiotics:  Rocephin   Objective: Filed Vitals:   12/15/13 0846  BP: 178/57  Pulse: 91  Temp:   Resp:     Intake/Output Summary (Last 24 hours) at 12/15/13 1210 Last data filed at 12/15/13 1123  Gross per 24 hour  Intake   1710 ml  Output    300 ml  Net   1410 ml   Filed Weights   12/13/13 1050 12/13/13  1519  Weight: 58.968 kg (130 lb) 58.968 kg (130 lb)    Exam: General: Alert and awake, oriented x3, not in any acute distress. HEENT: anicteric sclera, pupils reactive to light and accommodation, EOMI CVS: S1-S2 clear, no murmur rubs or gallops Chest: clear to auscultation bilaterally, no wheezing, rales or rhonchi Abdomen: soft nontender, nondistended, normal bowel sounds, no organomegaly Extremities: no cyanosis, clubbing or edema noted bilaterally Neuro: Cranial nerves II-XII intact, no focal neurological deficits  Data Reviewed: Basic Metabolic Panel:  Recent Labs Lab 12/11/13 1709 12/13/13 1150 12/14/13 0439 12/15/13 0507  NA 135* 136* 138 138  K 4.5 4.6 4.3 4.5  CL 99 102 106 106  CO2 24 21 20 21   GLUCOSE 251* 186* 187* 168*  BUN 39* 44* 36* 26*  CREATININE 2.37* 2.51* 1.92* 1.60*  CALCIUM 8.8 8.4 7.8* 7.6*   Liver Function Tests:  Recent Labs Lab 12/14/13 0439  AST 13  ALT 10  ALKPHOS 84  BILITOT 0.3  PROT 5.3*  ALBUMIN 2.0*   No results for input(s): LIPASE, AMYLASE in the last 168 hours. No results for input(s): AMMONIA in the last 168 hours. CBC:  Recent Labs Lab 12/11/13 1709 12/13/13 1150 12/15/13 0507  WBC 8.7 5.4 5.0  NEUTROABS 7.1 4.2  --   HGB 11.4* 10.8* 9.9*  HCT 33.1* 32.2* 29.8*  MCV 92.7 91.2 92.8  PLT 131* 110* 115*   Cardiac Enzymes: No results for input(s): CKTOTAL, CKMB, CKMBINDEX, TROPONINI in the last 168 hours. BNP (last  3 results) No results for input(s): PROBNP in the last 8760 hours. CBG:  Recent Labs Lab 12/13/13 1153 12/14/13 1223 12/14/13 1702 12/14/13 2153 12/15/13 0740  GLUCAP 161* 263* 242* 223* 164*    Micro Recent Results (from the past 240 hour(s))  Urine culture     Status: None   Collection Time: 12/11/13  7:17 PM  Result Value Ref Range Status   Specimen Description URINE, CLEAN CATCH  Final   Special Requests NONE  Final   Culture  Setup Time   Final    12/12/2013 14:11 Performed at [place]   Final    >=100,000 COLONIES/ML Performed at [facility]    Culture   Final    ESCHERICHIA COLI Performed at [facility]    Report Status 12/14/2013 FINAL  Final   Organism ID, Bacteria ESCHERICHIA COLI  Final      Susceptibility   Escherichia coli - MIC*    AMPICILLIN <=2 SENSITIVE Sensitive     CEFAZOLIN <=4 SENSITIVE Sensitive     CEFTRIAXONE <=1 SENSITIVE Sensitive     CIPROFLOXACIN <=0.25 SENSITIVE Sensitive     GENTAMICIN <=1 SENSITIVE Sensitive     LEVOFLOXACIN <=0.12 SENSITIVE Sensitive     NITROFURANTOIN <=16 SENSITIVE Sensitive     TOBRAMYCIN <=1 SENSITIVE Sensitive     TRIMETH/SULFA <=20 SENSITIVE Sensitive     PIP/TAZO <=4 SENSITIVE Sensitive     * ESCHERICHIA COLI     Studies: No results found.  Scheduled Meds: . aspirin  81 mg Oral BID  . cefTRIAXone (ROCEPHIN)  IV  1 g Intravenous Q24H  . guaiFENesin  1,200 mg Oral BID  . heparin  5,000 Units Subcutaneous 3 times per day  . insulin aspart  0-9 Units Subcutaneous TID WC  . levothyroxine  113 mcg Oral QAC breakfast  . metoprolol tartrate  12.5 mg Oral BID  . sodium chloride  3 mL Intravenous Q12H  . [START ON 12/16/2013] Vitamin D (Ergocalciferol)  50,000 Units Oral Once per day on Mon Thu   Continuous Infusions: . sodium chloride 75 mL/hr at 12/15/13 0530       Time spent: 35 minutes    [facility]  [facility] Pager [phone] If 7PM-7AM, please contact night-coverage at [email or web address], password [facility] 12/15/2013, 12:10 PM  LOS: 2 days

## 2013-12-15 NOTE — Telephone Encounter (Signed)
Post ED Visit - Positive Culture Follow-up  Culture report reviewed by antimicrobial stewardship pharmacist: []  Wes Tilden, Pharm.D., BCPS []  Heide Guile, Pharm.D., BCPS []  Alycia Rossetti, Pharm.D., BCPS []  [place], Pharm.D., BCPS, AAHIVP [x]  Legrand Como, Pharm.D., BCPS, AAHIVP []  Isac Sarna, Pharm.D., BCPS  Positive urine culture Treated with Keflex, organism sensitive to the same and no further patient follow-up is required at this time.  Myrna Blazer 12/15/2013, 12:26 PM

## 2013-12-16 DIAGNOSIS — E119 Type 2 diabetes mellitus without complications: Secondary | ICD-10-CM

## 2013-12-16 DIAGNOSIS — R55 Syncope and collapse: Secondary | ICD-10-CM

## 2013-12-16 LAB — BASIC METABOLIC PANEL
Anion gap: 12 (ref 5–15)
BUN: 18 mg/dL (ref 6–23)
CHLORIDE: 103 meq/L (ref 96–112)
CO2: 20 mEq/L (ref 19–32)
CREATININE: 1.45 mg/dL — AB (ref 0.50–1.10)
Calcium: 7.9 mg/dL — ABNORMAL LOW (ref 8.4–10.5)
GFR calc Af Amer: 39 mL/min — ABNORMAL LOW (ref >90)
GFR calc non Af Amer: 33 mL/min — ABNORMAL LOW (ref >90)
Glucose, Bld: 165 mg/dL — ABNORMAL HIGH (ref 70–99)
Potassium: 4.2 mEq/L (ref 3.7–5.3)
Sodium: 135 mEq/L — ABNORMAL LOW (ref 137–147)

## 2013-12-16 LAB — GLUCOSE, CAPILLARY
GLUCOSE-CAPILLARY: 211 mg/dL — AB (ref 70–99)
Glucose-Capillary: 175 mg/dL — ABNORMAL HIGH (ref 70–99)

## 2013-12-16 MED ORDER — CIPROFLOXACIN HCL 500 MG PO TABS: 500.0000 mg | ORAL_TABLET | Freq: Two times a day (BID) | ORAL | Status: DC

## 2013-12-16 MED ORDER — GLIPIZIDE 5 MG PO TABS: 2.5000 mg | ORAL_TABLET | Freq: Every day | ORAL | Status: DC

## 2013-12-16 MED ORDER — LEVOTHYROXINE SODIUM 112 MCG PO TABS: 112.0000 ug | ORAL_TABLET | Freq: Every morning | ORAL | Status: DC

## 2013-12-16 NOTE — Progress Notes (Signed)
Joann Hunter to be D/C'd Home per MD order.  Discussed with the patient and all questions fully answered.    Medication List    STOP taking these medications        cephALEXin 500 MG capsule  Commonly known as:  KEFLEX      TAKE these medications        aspirin 81 MG chewable tablet  Chew 81 mg by mouth 2 (two) times daily.     cholecalciferol 1000 UNITS tablet  Commonly known as:  VITAMIN D  Take 1,000 Units by mouth See admin instructions. Takes one daily except on Tuesdays and Thursdays     ciprofloxacin 500 MG tablet  Commonly known as:  CIPRO  Take 1 tablet (500 mg total) by mouth 2 (two) times daily.     fish oil-omega-3 fatty acids 1000 MG capsule  Take 1 g by mouth daily.     glipiZIDE 5 MG tablet  Commonly known as:  GLUCOTROL  Take 0.5 tablets (2.5 mg total) by mouth daily before breakfast.     HYDROcodone-acetaminophen 5-325 MG per tablet  Commonly known as:  NORCO/VICODIN  Take 0.5 tablets by mouth every 4 (four) hours as needed for moderate pain.     levothyroxine 112 MCG tablet  Commonly known as:  SYNTHROID, LEVOTHROID  Take 1 tablet (112 mcg total) by mouth every morning.     lisinopril 2.5 MG tablet  Commonly known as:  PRINIVIL,ZESTRIL  TAKE ONE (1) TABLET EACH DAY     meclizine 25 MG tablet  Commonly known as:  ANTIVERT  Take 25 mg by mouth 4 (four) times daily as needed. Dizziness     metoprolol tartrate 25 MG tablet  Commonly known as:  LOPRESSOR  Take 12.5 mg by mouth 2 (two) times daily.     nitroGLYCERIN 0.4 MG SL tablet  Commonly known as:  NITROSTAT  Place 0.4 mg under the tongue every 5 (five) minutes x 3 doses as needed. For chest pain.     ondansetron 4 MG disintegrating tablet  Commonly known as:  ZOFRAN-ODT  Take 4 mg by mouth every 8 (eight) hours as needed for nausea or vomiting.     Vitamin D (Ergocalciferol) 50000 UNITS Caps capsule  Commonly known as:  DRISDOL  Take 50,000 Units by mouth 2 (two) times a week.  Tuesdays and Thurdays        VVS, Skin clean, dry and intact without evidence of skin break down, no evidence of skin tears noted. IV catheter discontinued intact. Site without signs and symptoms of complications. Dressing and pressure applied.  An After Visit Summary was printed and given to the patient.  D/c education completed with patient/family including follow up instructions, medication list, d/c activities limitations if indicated, with other d/c instructions as indicated by MD - patient able to verbalize understanding, all questions fully answered. Information and handouts given about new medications.   Patient instructed to return to ED, call 911, or call MD for any changes in condition.   Patient escorted via [place], and D/C home via private auto.  Joann Hunter 12/16/2013 2:07 PM

## 2013-12-16 NOTE — Discharge Summary (Signed)
Physician Discharge Summary  Joann Hunter BFX:[id number] DOB: [birth year] DOA: 12/13/2013  PCP: Octavio Graves, DO  Admit date: 12/13/2013 Discharge date: 12/16/2013  Time spent: 40 minutes  Recommendations for Outpatient Follow-up:  1. Follow-up with primary care physician within one week.  Discharge Diagnoses:  Principal Problem:   Sepsis Active Problems:   Syncope, near   AKI (acute kidney injury)   Fall   UTI (lower urinary tract infection)   CKD (chronic kidney disease), stage III   DM (diabetes mellitus)   Discharge Condition: Stable  Diet recommendation: Carb modified diet/heart healthy  Filed Weights   12/13/13 1050 12/13/13 1519  Weight: 58.968 kg (130 lb) 58.968 kg (130 lb)    History of present illness:  Joann Hunter is a 80 y.o. female with past medical history of CAD, ischemic cardiomyopathy and hypothyroidism came into the hospital because of dizziness and she felt earlier today. Patient was complaining about dizziness for the past several days, getting worse overall, she went to [facility] on [facility]/9 and she was diagnosed with UTI, she was discharged on Keflex, apparently she wasn't getting better, and the dizziness got worse so she came back to the hospital after she fell today and hit her head. In the ED CT scan of the head was done and showed no evidence of acute events, creatinine is 2.5 which is slightly worse than 2 days ago. Urinalysis is still showing TNTC WBCs.   Hospital Course:    UTI Escherichia coli UTI, patient was discharged home on Keflex from 12/9, not improving. Admitted to the hospital, started on Rocephin. We'll adjust antibiotics according to culture results.  Sepsis Early sepsis secondary to UTI with fever of 101.9, heart rate of 102 in presence of UTI. Hydrate with IV fluids and treat UTI with IV antibiotics.  Diabetes mellitus type 2 New diagnosis of type 2 diabetes mellitus, based on hyperglycemia and hemoglobin  A1c of 7.0. Started on low-dose of glipizide because of renal function, glipizide 2.5 mg daily.  Fall She is complaining about dizziness and generalized weakness for the past several days likely secondary to UTI. CT scan of the head did not show intracranial bleeding or fractures. Was very orthostatic with blood pressure of 170/72 and when she gets up at 107/84. Left lower extremity pain, daughter mentions she has pain not sure before after the fall, x-rays were negative for fractures. Doppler was not done, patient does not have swelling, redness or tenderness. Low suspicion for DVT.  CKD stage III Patient has chronic kidney disease with elevated creatinine since 2013. Presented with creatinine of 2.5, 2 days ago with was 2.3. This is slightly improved to 1.92 after IV fluid hydration, continue hydration with IV fluids. We'll hydrate patient with IV fluids, check BMP in a.m.  Hypertension Blood pressures trending up likely with IV fluids, IV fluids discontinued Restarted home medications including lisinopril and metoprolol.  Near syncope The dizziness is likely secondary to UTI, patient is very orthostatic.   Procedures:  None  Consultations:  None  Discharge Exam: Filed Vitals:   12/16/13 1135  BP: 156/61  Pulse: 72  Temp: 98.2 F (36.8 C)  Resp: 18   General: Alert and awake, oriented x3, not in any acute distress. HEENT: anicteric sclera, pupils reactive to light and accommodation, EOMI CVS: S1-S2 clear, no murmur rubs or gallops Chest: clear to auscultation bilaterally, no wheezing, rales or rhonchi Abdomen: soft nontender, nondistended, normal bowel sounds, no organomegaly Extremities: no cyanosis, clubbing or  edema noted bilaterally Neuro: Cranial nerves II-XII intact, no focal neurological deficits  Discharge Instructions You were cared for by a hospitalist during your hospital stay. If you have any questions about your discharge medications or the care you  received while you were in the hospital after you are discharged, you can call the unit and asked to speak with the hospitalist on call if the hospitalist that took care of you is not available. Once you are discharged, your primary care physician will handle any further medical issues. Please note that NO REFILLS for any discharge medications will be authorized once you are discharged, as it is imperative that you return to your primary care physician (or establish a relationship with a primary care physician if you do not have one) for your aftercare needs so that they can reassess your need for medications and monitor your lab values.  Discharge Instructions    Diet Carb Modified    Complete by:  As directed      Increase activity slowly    Complete by:  As directed           Current Discharge Medication List    START taking these medications   Details  ciprofloxacin (CIPRO) 500 MG tablet Take 1 tablet (500 mg total) by mouth 2 (two) times daily. Qty: 6 tablet, Refills: 0    glipiZIDE (GLUCOTROL) 5 MG tablet Take 0.5 tablets (2.5 mg total) by mouth daily before breakfast. Qty: 30 tablet, Refills: 0      CONTINUE these medications which have CHANGED   Details  levothyroxine (SYNTHROID, LEVOTHROID) 112 MCG tablet Take 1 tablet (112 mcg total) by mouth every morning. Qty: 30 tablet, Refills: 0      CONTINUE these medications which have NOT CHANGED   Details  aspirin 81 MG chewable tablet Chew 81 mg by mouth 2 (two) times daily.     fish oil-omega-3 fatty acids 1000 MG capsule Take 1 g by mouth daily.    HYDROcodone-acetaminophen (NORCO/VICODIN) 5-325 MG per tablet Take 0.5 tablets by mouth every 4 (four) hours as needed for moderate pain. Qty: 20 tablet, Refills: 0    meclizine (ANTIVERT) 25 MG tablet Take 25 mg by mouth 4 (four) times daily as needed. Dizziness    metoprolol tartrate (LOPRESSOR) 25 MG tablet Take 12.5 mg by mouth 2 (two) times daily.    ondansetron  (ZOFRAN-ODT) 4 MG disintegrating tablet Take 4 mg by mouth every 8 (eight) hours as needed for nausea or vomiting.    Vitamin D, Ergocalciferol, (DRISDOL) 50000 UNITS CAPS Take 50,000 Units by mouth 2 (two) times a week. Tuesdays and Thurdays    cholecalciferol (VITAMIN D) 1000 UNITS tablet Take 1,000 Units by mouth See admin instructions. Takes one daily except on Tuesdays and Thursdays    lisinopril (PRINIVIL,ZESTRIL) 2.5 MG tablet TAKE ONE (1) TABLET EACH DAY Qty: 30 tablet, Refills: 3    nitroGLYCERIN (NITROSTAT) 0.4 MG SL tablet Place 0.4 mg under the tongue every 5 (five) minutes x 3 doses as needed. For chest pain.      STOP taking these medications     cephALEXin (KEFLEX) 500 MG capsule        Allergies  Allergen Reactions  . Lipitor [Atorvastatin]   . Penicillins Rash   Follow-up Information    Follow up with BUTLER, CYNTHIA, DO In 1 week.   Contact information:   [place] [address] [place] [id number] [phone]        The results  of significant diagnostics from this hospitalization (including imaging, microbiology, ancillary and laboratory) are listed below for reference.    Significant Diagnostic Studies: Dg Chest 2 View  12/15/2013   CLINICAL DATA:  Cough, initial evaluation  EXAM: CHEST  2 VIEW  COMPARISON:  12/13/2013  FINDINGS: Low lung volumes. Mild cardiac enlargement. Vascular pattern normal. No consolidation or effusion.  IMPRESSION: No active cardiopulmonary disease.   Electronically Signed   By: Skipper Cliche M.D.   On: 12/15/2013 18:33   Dg Chest 2 View  12/13/2013   CLINICAL DATA:  [place] twice last week.  Left sided chest pain.  EXAM: CHEST  2 VIEW  COMPARISON:  04/25/2011  FINDINGS: The cardiac silhouette, mediastinal and hilar contours are within normal limits and stable. There is tortuosity and calcification of the thoracic aorta. The lungs are clear of acute process. Low lung volumes with minimal streaky basilar atelectasis. The bony thorax is  grossly intact. No definite rib or vertebral body fractures.  IMPRESSION: No acute cardiopulmonary findings and grossly intact bony thorax.   Electronically Signed   By: Kalman Jewels M.D.   On: 12/13/2013 11:36   Dg Tibia/fibula Left  12/15/2013   CLINICAL DATA:  Left leg pain initial evaluation, fell 2 days ago  EXAM: LEFT TIBIA AND FIBULA - 2 VIEW  COMPARISON:  None.  FINDINGS: There is no evidence of fracture or other focal bone lesions. Soft tissues are unremarkable.  IMPRESSION: Negative.   Electronically Signed   By: Skipper Cliche M.D.   On: 12/15/2013 18:20   Ct Head Wo Contrast  12/13/2013   CLINICAL DATA:  [place] this morning.  Hit head.  Confusion.  EXAM: CT HEAD WITHOUT CONTRAST  TECHNIQUE: Contiguous axial images were obtained from the base of the skull through the vertex without intravenous contrast.  COMPARISON:  None.  FINDINGS: The ventricles are normal in size and configuration. No extra-axial fluid collections are identified. The gray-white differentiation is normal. No CT findings for acute intracranial process such as hemorrhage or infarction. No mass lesions. The brainstem and cerebellum are grossly normal.  The bony structures are intact. The paranasal sinuses and mastoid air cells are clear. The globes are intact.  IMPRESSION: No acute intracranial findings or mass lesion and no acute skull fracture.   Electronically Signed   By: Kalman Jewels M.D.   On: 12/13/2013 11:45   Ct Renal Stone Study  12/11/2013   CLINICAL DATA:  Left flank pain, low back pain, microscopic hematuria for 1 month  EXAM: CT ABDOMEN AND PELVIS WITHOUT CONTRAST  TECHNIQUE: Multidetector CT imaging of the abdomen and pelvis was performed following the standard protocol without IV contrast.  COMPARISON:  CT lumbar spine 9/ 2/15  FINDINGS: Sagittal images of the spine shows stable compression fracture upper endplate of L2 vertebral body. Again noted diffuse osteopenia and degenerative changes thoracolumbar  spine. Significant disc space flattening with vacuum disc phenomenon at L4-L5 and L5-S1 level.  The lung bases are unremarkable.  Small hiatal hernia. Unenhanced liver shows no biliary ductal dilatation. No calcified gallstones are noted within gallbladder. There is mild pancreatic atrophy. The spleen and adrenal glands are unremarkable.  There is mild right hydronephrosis and proximal right hydroureter. Mild thickening of proximal ureteral wall. From the level of right iliac vein the right ureter is small caliber. No calcified ureteral calculi are noted bilaterally. Although findings may be due to upper right urinary tract inflammation a right mid ureteral stricture or neoplastic process cannot be  excluded. Correlation with urology exam enhanced study or retrograde study is recommended. No nephrolithiasis. Bilateral distal ureter is unremarkable.  No small bowel obstruction. Atherosclerotic calcifications of abdominal aorta and iliac arteries. Normal retrocecal appendix. No pericecal inflammation. The terminal ileum is unremarkable. Atrophic uterus. Urinary bladder is unremarkable.  No aortic aneurysm  IMPRESSION: 1. There is mild right hydronephrosis and proximal right hydroureter. There is thickening of proximal right ureteral wall. Although findings may be due to right upper urinary tract inflammation a mid ureteral stricture or neoplastic process cannot be excluded. Correlation with urology exam, enhanced or retrograde study is recommended. 2. No nephrolithiasis. Bilateral no calcified ureteral calculi are noted. No calcified calculi are noted within urinary bladder. 3. Normal retrocecal appendix. 4. No small bowel obstruction. 5. Stable compression fracture of L2 vertebral body. 6. Atrophic uterus.   Electronically Signed   By: Lahoma Crocker M.D.   On: 12/11/2013 18:53    Microbiology: Recent Results (from the past 240 hour(s))  Urine culture     Status: None   Collection Time: 12/11/13  7:17 PM  Result  Value Ref Range Status   Specimen Description URINE, CLEAN CATCH  Final   Special Requests NONE  Final   Culture  Setup Time   Final    12/12/2013 14:11 Performed at [place]   Final    >=100,000 COLONIES/ML Performed at [facility]    Culture   Final    ESCHERICHIA COLI Performed at [facility]    Report Status 12/14/2013 FINAL  Final   Organism ID, Bacteria ESCHERICHIA COLI  Final      Susceptibility   Escherichia coli - MIC*    AMPICILLIN <=2 SENSITIVE Sensitive     CEFAZOLIN <=4 SENSITIVE Sensitive     CEFTRIAXONE <=1 SENSITIVE Sensitive     CIPROFLOXACIN <=0.25 SENSITIVE Sensitive     GENTAMICIN <=1 SENSITIVE Sensitive     LEVOFLOXACIN <=0.12 SENSITIVE Sensitive     NITROFURANTOIN <=16 SENSITIVE Sensitive     TOBRAMYCIN <=1 SENSITIVE Sensitive     TRIMETH/SULFA <=20 SENSITIVE Sensitive     PIP/TAZO <=4 SENSITIVE Sensitive     * ESCHERICHIA COLI     Labs: Basic Metabolic Panel:  Recent Labs Lab 12/11/13 1709 12/13/13 1150 12/14/13 0439 12/15/13 0507 12/16/13 0611  NA 135* 136* 138 138 135*  K 4.5 4.6 4.3 4.5 4.2  CL 99 102 106 106 103  CO2 24 21 20 21 20   GLUCOSE 251* 186* 187* 168* 165*  BUN 39* 44* 36* 26* 18  CREATININE 2.37* 2.51* 1.92* 1.60* 1.45*  CALCIUM 8.8 8.4 7.8* 7.6* 7.9*   Liver Function Tests:  Recent Labs Lab 12/14/13 0439  AST 13  ALT 10  ALKPHOS 84  BILITOT 0.3  PROT 5.3*  ALBUMIN 2.0*   No results for input(s): LIPASE, AMYLASE in the last 168 hours. No results for input(s): AMMONIA in the last 168 hours. CBC:  Recent Labs Lab 12/11/13 1709 12/13/13 1150 12/15/13 0507  WBC 8.7 5.4 5.0  NEUTROABS 7.1 4.2  --   HGB 11.4* 10.8* 9.9*  HCT 33.1* 32.2* 29.8*  MCV 92.7 91.2 92.8  PLT 131* 110* 115*   Cardiac Enzymes: No results for input(s): CKTOTAL, CKMB, CKMBINDEX, TROPONINI in the last 168 hours. BNP: BNP (last 3 results)  Recent Labs  12/15/13 1245  PROBNP 2030.0*    CBG:  Recent Labs Lab 12/15/13 1203 12/15/13 1834 12/15/13 2149 12/16/13 0739 12/16/13  [place]       Signed:  Jessicah Croll A  [facility] 12/16/2013, 1:27 PM

## 2013-12-16 NOTE — Progress Notes (Signed)
VASCULAR LAB PRELIMINARY  PRELIMINARY  PRELIMINARY  PRELIMINARY  Carotid Dopplers completed.    Preliminary report:  1-39% ICA stenosis.  Vertebral artery flow is antegrade.   Coulson Wehner, RVT 12/16/2013, 10:30 AM

## 2013-12-16 NOTE — Progress Notes (Signed)
Medicare Important Message given?  YES (If response is "NO", the following Medicare IM given date fields will be blank) Date Medicare IM given:  12/16/13 Medicare IM given by:  Tomi Bamberger

## 2013-12-19 NOTE — Care Management Note (Signed)
    Page 1 of 1   12/19/2013     7:41:55 AM CARE MANAGEMENT NOTE 12/19/2013  Patient:  Joann Hunter, Joann Hunter   Account Number:  [id number]  Date Initiated:  12/18/2013  Documentation initiated by:  Tomi Bamberger  Subjective/Objective Assessment:   dx dizziness, dehydration, uti  admit- lives with family.     Action/Plan:   Anticipated DC Date:  12/16/2013   Anticipated DC Plan:  [place]  CM consult      Choice offered to / List presented to:             Status of service:  Completed, signed off Medicare Important Message given?  YES (If response is "NO", the following Medicare IM given date fields will be blank) Date Medicare IM given:  12/16/2013 Medicare IM given by:  Tomi Bamberger Date Additional Medicare IM given:   Additional Medicare IM given by:    Discharge Disposition:  HOME/SELF CARE  Per UR Regulation:  Reviewed for med. necessity/level of care/duration of stay  If discussed at [place] of Stay Meetings, dates discussed:    Comments:  12/18/13 Tomi Bamberger RN, BSN [phone] No needs anticipated.

## 2014-04-04 ENCOUNTER — Emergency Department (HOSPITAL_COMMUNITY): Payer: Medicare Other

## 2014-04-04 ENCOUNTER — Emergency Department (HOSPITAL_COMMUNITY)
Admission: EM | Admit: 2014-04-04 | Discharge: 2014-04-04 | Disposition: A | Discharge status: Home / Self Care | Payer: Medicare Other | Attending: Emergency Medicine | Admitting: Emergency Medicine

## 2014-04-04 ENCOUNTER — Encounter (HOSPITAL_COMMUNITY): Payer: Self-pay

## 2014-04-04 DIAGNOSIS — E785 Hyperlipidemia, unspecified: Secondary | ICD-10-CM | POA: Diagnosis not present

## 2014-04-04 DIAGNOSIS — I252 Old myocardial infarction: Secondary | ICD-10-CM | POA: Diagnosis not present

## 2014-04-04 DIAGNOSIS — R52 Pain, unspecified: Secondary | ICD-10-CM

## 2014-04-04 DIAGNOSIS — Z880 Allergy status to penicillin: Secondary | ICD-10-CM | POA: Diagnosis not present

## 2014-04-04 DIAGNOSIS — G8929 Other chronic pain: Secondary | ICD-10-CM | POA: Insufficient documentation

## 2014-04-04 DIAGNOSIS — E860 Dehydration: Secondary | ICD-10-CM | POA: Diagnosis not present

## 2014-04-04 DIAGNOSIS — Z9889 Other specified postprocedural states: Secondary | ICD-10-CM | POA: Insufficient documentation

## 2014-04-04 DIAGNOSIS — R5383 Other fatigue: Secondary | ICD-10-CM | POA: Diagnosis not present

## 2014-04-04 DIAGNOSIS — F329 Major depressive disorder, single episode, unspecified: Secondary | ICD-10-CM

## 2014-04-04 DIAGNOSIS — Z87440 Personal history of urinary (tract) infections: Secondary | ICD-10-CM | POA: Insufficient documentation

## 2014-04-04 DIAGNOSIS — Z79899 Other long term (current) drug therapy: Secondary | ICD-10-CM | POA: Diagnosis not present

## 2014-04-04 DIAGNOSIS — I2510 Atherosclerotic heart disease of native coronary artery without angina pectoris: Secondary | ICD-10-CM | POA: Diagnosis not present

## 2014-04-04 DIAGNOSIS — Z7982 Long term (current) use of aspirin: Secondary | ICD-10-CM | POA: Diagnosis not present

## 2014-04-04 DIAGNOSIS — Z9861 Coronary angioplasty status: Secondary | ICD-10-CM | POA: Insufficient documentation

## 2014-04-04 DIAGNOSIS — M1990 Unspecified osteoarthritis, unspecified site: Secondary | ICD-10-CM | POA: Insufficient documentation

## 2014-04-04 DIAGNOSIS — N189 Chronic kidney disease, unspecified: Secondary | ICD-10-CM

## 2014-04-04 DIAGNOSIS — M79605 Pain in left leg: Secondary | ICD-10-CM | POA: Diagnosis not present

## 2014-04-04 DIAGNOSIS — Z792 Long term (current) use of antibiotics: Secondary | ICD-10-CM | POA: Insufficient documentation

## 2014-04-04 DIAGNOSIS — E039 Hypothyroidism, unspecified: Secondary | ICD-10-CM | POA: Diagnosis not present

## 2014-04-04 DIAGNOSIS — I10 Essential (primary) hypertension: Secondary | ICD-10-CM | POA: Insufficient documentation

## 2014-04-04 DIAGNOSIS — R627 Adult failure to thrive: Secondary | ICD-10-CM | POA: Diagnosis present

## 2014-04-04 LAB — COMPREHENSIVE METABOLIC PANEL
ALBUMIN: 3.4 g/dL — AB (ref 3.5–5.2)
ALT: 13 U/L (ref 0–35)
ANION GAP: 8 (ref 5–15)
AST: 21 U/L (ref 0–37)
Alkaline Phosphatase: 49 U/L (ref 39–117)
BUN: 35 mg/dL — AB (ref 6–23)
CALCIUM: 9 mg/dL (ref 8.4–10.5)
CO2: 25 mmol/L (ref 19–32)
CREATININE: 2.4 mg/dL — AB (ref 0.50–1.10)
Chloride: 102 mmol/L (ref 96–112)
GFR calc Af Amer: 21 mL/min — ABNORMAL LOW (ref >90)
GFR calc non Af Amer: 18 mL/min — ABNORMAL LOW (ref >90)
Glucose, Bld: 223 mg/dL — ABNORMAL HIGH (ref 70–99)
Potassium: 4.5 mmol/L (ref 3.5–5.1)
Sodium: 135 mmol/L (ref 135–145)
TOTAL PROTEIN: 6.6 g/dL (ref 6.0–8.3)
Total Bilirubin: 0.5 mg/dL (ref 0.3–1.2)

## 2014-04-04 LAB — CBC WITH DIFFERENTIAL/PLATELET
BASOS ABS: 0 10*3/uL (ref 0.0–0.1)
Basophils Relative: 1 % (ref 0–1)
EOS ABS: 0.1 10*3/uL (ref 0.0–0.7)
Eosinophils Relative: 3 % (ref 0–5)
HCT: 32.6 % — ABNORMAL LOW (ref 36.0–46.0)
Hemoglobin: 10.9 g/dL — ABNORMAL LOW (ref 12.0–15.0)
LYMPHS ABS: 1.1 10*3/uL (ref 0.7–4.0)
Lymphocytes Relative: 23 % (ref 12–46)
MCH: 31.8 pg (ref 26.0–34.0)
MCHC: 33.4 g/dL (ref 30.0–36.0)
MCV: 95 fL (ref 78.0–100.0)
MONOS PCT: 9 % (ref 3–12)
Monocytes Absolute: 0.5 10*3/uL (ref 0.1–1.0)
NEUTROS ABS: 3.2 10*3/uL (ref 1.7–7.7)
NEUTROS PCT: 64 % (ref 43–77)
Platelets: 174 10*3/uL (ref 150–400)
RBC: 3.43 MIL/uL — AB (ref 3.87–5.11)
RDW: 12.9 % (ref 11.5–15.5)
WBC: 4.9 10*3/uL (ref 4.0–10.5)

## 2014-04-04 LAB — TROPONIN I: Troponin I: <0.03 ng/mL (ref <0.031)

## 2014-04-04 LAB — URINALYSIS, ROUTINE W REFLEX MICROSCOPIC
Bilirubin Urine: NEGATIVE
Glucose, UA: NEGATIVE mg/dL
Hgb urine dipstick: NEGATIVE
Ketones, ur: NEGATIVE mg/dL
Leukocytes, UA: NEGATIVE
Nitrite: NEGATIVE
Protein, ur: NEGATIVE mg/dL
Specific Gravity, Urine: >1.03 — ABNORMAL HIGH (ref 1.005–1.030)
Urobilinogen, UA: 0.2 mg/dL (ref 0.0–1.0)
pH: 5.5 (ref 5.0–8.0)

## 2014-04-04 MED ORDER — SODIUM CHLORIDE 0.9 % IV SOLN
Freq: Once | INTRAVENOUS | Status: AC
  Administered 2014-04-04: 21:00:00 via INTRAVENOUS

## 2014-04-04 MED ORDER — SODIUM CHLORIDE 0.9 % IV BOLUS (SEPSIS): 500.0000 mL | Freq: Once | INTRAVENOUS | Status: DC

## 2014-04-04 NOTE — ED Provider Notes (Addendum)
CSN: [id number]     Arrival date & time 04/04/14  1652 History   First MD Initiated Contact with Patient 04/04/14 1713     Chief Complaint  Patient presents with  . Failure To Thrive     (Consider location/radiation/quality/duration/timing/severity/associated sxs/prior Treatment) Patient is a 81 y.o. female presenting with weakness. The history is provided by the patient (the pt complains of poor po intake and weakness).  Weakness This is a recurrent problem. The current episode started 2 days ago. The problem occurs constantly. The problem has not changed since onset.Pertinent negatives include no chest pain, no abdominal pain and no headaches. Nothing aggravates the symptoms. Nothing relieves the symptoms.    Past Medical History  Diagnosis Date  . Hypothyroidism   . CAD (coronary artery disease)     NSTEMI 04/2011 with newly diagnosed 3V CAD s/p PCI/DES mLAD 04/11/11 with residual dz (per Dr. Burt Knack, should have consideration of PCI of the occluded RCA based on symptoms and/or consideration of outpatient nuclear perfusion testing after the patient recovers from her infarct)  . Ischemic cardiomyopathy     EF 45% by cath 04/20/11, 50-55% by echo 04/22/11. hypotension limiting medication titration   . Hyperlipidemia   . Hyperglycemia   . Hypertension   . Back pain, chronic   . Myocardial infarction 2013  . UTI (urinary tract infection) 12/13/2013  . Arthritis    Past Surgical History  Procedure Laterality Date  . Breast lumpectomy      right  . Coronary stent placement    . Back surgery    . Left heart catheterization with coronary angiogram N/A 04/20/2011    Procedure: LEFT HEART CATHETERIZATION WITH CORONARY ANGIOGRAM;  Surgeon: Burnell Blanks, MD;  Location: [facility] CATH LAB;  Service: Cardiovascular;  Laterality: N/A;  . Percutaneous coronary stent intervention (pci-s) N/A 04/21/2011    Procedure: PERCUTANEOUS CORONARY STENT INTERVENTION (PCI-S);  Surgeon: Sherren Mocha, MD;   Location: [facility] CATH LAB;  Service: Cardiovascular;  Laterality: N/A;   Family History  Problem Relation Age of Onset  . Other      no known family cardiac disease   History  Substance Use Topics  . Smoking status: Never Smoker   . Smokeless tobacco: Never Used  . Alcohol Use: No   OB History    No data available     Review of Systems  Constitutional: Positive for fatigue. Negative for appetite change.  HENT: Negative for congestion, ear discharge and sinus pressure.   Eyes: Negative for discharge.  Respiratory: Negative for cough.   Cardiovascular: Negative for chest pain.  Gastrointestinal: Negative for abdominal pain and diarrhea.  Genitourinary: Negative for frequency and hematuria.  Musculoskeletal: Negative for back pain.  Skin: Negative for rash.  Neurological: Positive for weakness. Negative for seizures and headaches.  Psychiatric/Behavioral: Negative for hallucinations.      Allergies  Lipitor and Penicillins  Home Medications   Prior to Admission medications   Medication Sig Start Date End Date Taking? Authorizing Provider  aspirin 81 MG chewable tablet Chew 81 mg by mouth 2 (two) times daily.    Yes Historical Provider, MD  fish oil-omega-3 fatty acids 1000 MG capsule Take 1 g by mouth daily.   Yes Historical Provider, MD  glipiZIDE (GLUCOTROL) 5 MG tablet Take 0.5 tablets (2.5 mg total) by mouth daily before breakfast. 12/16/13  Yes Verlee Monte, MD  HYDROcodone-acetaminophen ([place]) 7.5-325 MG per tablet Take 1 tablet by mouth every 4 (four) hours as needed. 03/29/14  Yes Historical Provider, MD  levothyroxine (SYNTHROID, LEVOTHROID) 112 MCG tablet Take 1 tablet (112 mcg total) by mouth every morning. 12/16/13  Yes Verlee Monte, MD  meclizine (ANTIVERT) 25 MG tablet Take 25 mg by mouth 4 (four) times daily as needed. Dizziness   Yes Historical Provider, MD  metoprolol tartrate (LOPRESSOR) 25 MG tablet Take 12.5 mg by mouth 2 (two) times daily. 04/23/11  Yes  Dayna N Dunn, PA-C  nitroGLYCERIN (NITROSTAT) 0.4 MG SL tablet Place 0.4 mg under the tongue every 5 (five) minutes x 3 doses as needed. For chest pain. 04/23/11  Yes Dayna N Dunn, PA-C  ondansetron (ZOFRAN-ODT) 4 MG disintegrating tablet Take 4 mg by mouth every 8 (eight) hours as needed for nausea or vomiting.   Yes Historical Provider, MD  Vitamin D, Ergocalciferol, (DRISDOL) 50000 UNITS CAPS Take 50,000 Units by mouth 2 (two) times a week. Tuesdays and Thurdays 05/30/12  Yes Historical Provider, MD  Vortioxetine HBr 5 MG TABS Take 5 mg by mouth every morning.   Yes Historical Provider, MD  buprenorphine (BUTRANS - DOSED MCG/HR) 5 MCG/HR PTWK patch Place 5 mcg onto the skin once a week.    Historical Provider, MD  ciprofloxacin (CIPRO) 500 MG tablet Take 1 tablet (500 mg total) by mouth 2 (two) times daily. Patient not taking: Reported on 04/04/2014 12/16/13   Verlee Monte, MD  lisinopril (PRINIVIL,ZESTRIL) 2.5 MG tablet TAKE ONE (1) TABLET EACH DAY Patient not taking: Reported on 12/13/2013 03/15/13   Minus Breeding, MD   BP 177/78 mmHg  Pulse 66  Temp(Src) 98.7 F (37.1 C) (Oral)  Resp 14  Ht 5\' 2"  (1.575 m)  Wt 160 lb (72.576 kg)  BMI 29.26 kg/m2  SpO2 94% Physical Exam  Constitutional: She is oriented to person, place, and time. She appears well-developed.  HENT:  Head: Normocephalic.  Eyes: Conjunctivae and EOM are normal. No scleral icterus.  Neck: Neck supple. No thyromegaly present.  Cardiovascular: Normal rate and regular rhythm.  Exam reveals no gallop and no friction rub.   No murmur heard. Pulmonary/Chest: No stridor. She has no wheezes. She has no rales. She exhibits no tenderness.  Abdominal: She exhibits no distension. There is no tenderness. There is no rebound.  Musculoskeletal: Normal range of motion. She exhibits no edema.  Lymphadenopathy:    She has no cervical adenopathy.  Neurological: She is oriented to person, place, and time. She exhibits normal muscle tone.  Coordination normal.  Skin: No rash noted. No erythema.  Psychiatric: She has a normal mood and affect. Her behavior is normal.    ED Course  Procedures (including critical care time) Labs Review Labs Reviewed  CBC WITH DIFFERENTIAL/PLATELET - Abnormal; Notable for the following:    RBC 3.43 (*)    Hemoglobin 10.9 (*)    HCT 32.6 (*)    All other components within normal limits  COMPREHENSIVE METABOLIC PANEL - Abnormal; Notable for the following:    Glucose, Bld 223 (*)    BUN 35 (*)    Creatinine, Ser 2.40 (*)    Albumin 3.4 (*)    GFR calc non Af Amer 18 (*)    GFR calc Af Amer 21 (*)    All other components within normal limits  URINALYSIS, ROUTINE W REFLEX MICROSCOPIC - Abnormal; Notable for the following:    Specific Gravity, Urine >1.030 (*)    All other components within normal limits  TROPONIN I    Imaging Review Dg Chest 2 View  04/04/2014   CLINICAL DATA:  Failure to thrive.  EXAM: CHEST  2 VIEW  COMPARISON:  12/15/2013  FINDINGS: Artifact overlies the chest. The heart is enlarged. Stents are in place. Aortic atherosclerosis. The lungs are clear except for mild scarring at the left base. No effusion. Bony structures unremarkable.  IMPRESSION: Cardiomegaly. Coronary stents. Aortic atherosclerosis. Mild scarring left lower lobe.   Electronically Signed   By: Nelson Chimes M.D.   On: 04/04/2014 20:30   Ct Head Wo Contrast  04/04/2014   CLINICAL DATA:  Lethargy.  Loss of appetite.  Failure to thrive.  EXAM: CT HEAD WITHOUT CONTRAST  TECHNIQUE: Contiguous axial images were obtained from the base of the skull through the vertex without intravenous contrast.  COMPARISON:  12/13/2013  FINDINGS: The brain does not show accelerated atrophy for a age. There is no evidence of acute infarction, mass lesion, hemorrhage, hydrocephalus or extra-axial collection. Mild chronic appearing small vessel change of the hemispheric white matter. No calvarial abnormality. No fluid in the sinuses,  middle ears or mastoids.  IMPRESSION: No change. No acute finding. Mild small vessel change of the hemispheric white matter.   Electronically Signed   By: Nelson Chimes M.D.   On: 04/04/2014 20:32   Dg Hip Unilat With Pelvis 2-3 Views Left  04/04/2014   CLINICAL DATA:  Weakness and dizziness.  Fell with left hip pain.  EXAM: LEFT HIP (WITH PELVIS) 2-3 VIEWS  COMPARISON:  None.  FINDINGS: There is osteoarthritis of the left hip. No evidence of fracture or dislocation. Sacroiliac joints appear normal.  IMPRESSION: Osteoarthritis of the left hip.  No acute or traumatic finding.   Electronically Signed   By: Nelson Chimes M.D.   On: 04/04/2014 20:31     EKG Interpretation   Date/Time:  Friday April 04 2014 19:04:12 EDT Ventricular Rate:  66 PR Interval:  219 QRS Duration: 67 QT Interval:  413 QTC Calculation: 433 R Axis:   121 Text Interpretation:  Sinus rhythm Borderline prolonged PR interval  Probable lateral infarct, age indeterminate Confirmed by Steffan Caniglia  MD,  Senetra Dillin [phone]) on 04/04/2014 9:21:57 PM      MDM   Final diagnoses:  Pain  Dehydration    Pt seen by hospitalist and we will tx as out pt for dehydration    Milton Ferguson, MD 04/04/14 2122  Milton Ferguson, MD 04/04/14 2151  Milton Ferguson, MD 04/04/14 2203

## 2014-04-04 NOTE — Discharge Instructions (Signed)
Drink plenty of fluids and follow up with your md next week °

## 2014-04-04 NOTE — ED Notes (Signed)
No appetite per pt, c/o lower back pain and lower leg pain. C/o " I just don't feel like doing anything. Im just tired"

## 2014-04-04 NOTE — Consult Note (Signed)
[facility] History and Physical  EARLY ORD IHK:[id number] DOB: [birth year]    PCP:   Octavio Graves, DO   Chief Complaint:  fatigue, loss of appetite, and weakness.   HPI: Joann Hunter is an 81 y.o. female with hx of hypothyroidism, CAD, HLD, HTN, chronic back pain, hx of UTI, brought to the ER as she has not been feeling well, fatigue, loss of appetite, and feeling sad and depressed.  She has recently being apart from her husband, as he has felt ill.  She denied CP, SOB, abdominal pain, nausea, vomiting, fever, chills, lightheadedness, black or bloody stool.  Evaluation in the ER showed negative UA, clear CXR, no acute finding on head CT, and normal WBC, Hb of 10.9 grams per dL, and Cr of 2.4, not significant change from her baseline.  She did not want to be admitted into the hospital.  Hospitalist was asked to admit her for OBS.   Rewiew of Systems:  Constitutional: Negative for malaise, fever and chills. No significant weight loss or weight gain Eyes: Negative for eye pain, redness and discharge, diplopia, visual changes, or flashes of light. ENMT: Negative for ear pain, hoarseness, nasal congestion, sinus pressure and sore throat. No headaches; tinnitus, drooling, or problem swallowing. Cardiovascular: Negative for chest pain, palpitations, diaphoresis, dyspnea and peripheral edema. ; No orthopnea, PND Respiratory: Negative for cough, hemoptysis, wheezing and stridor. No pleuritic chestpain. Gastrointestinal: Negative for nausea, vomiting, diarrhea, constipation, abdominal pain, melena, blood in stool, hematemesis, jaundice and rectal bleeding.    Genitourinary: Negative for frequency, dysuria, incontinence,flank pain and hematuria; Musculoskeletal: Negative for back pain and neck pain. Negative for swelling and trauma.;  Skin: . Negative for pruritus, rash, abrasions, bruising and skin lesion.; ulcerations Neuro: Negative for headache, lightheadedness and neck stiffness.  Negative for weakness, altered level of consciousness , altered mental status, extremity weakness, burning feet, involuntary movement, seizure and syncope.  Psych: negative for anxiety,  insomnia, panic attacks, hallucinations, paranoia, suicidal or homicidal ideation    Past Medical History  Diagnosis Date  . Hypothyroidism   . CAD (coronary artery disease)     NSTEMI 04/2011 with newly diagnosed 3V CAD s/p PCI/DES mLAD 04/11/11 with residual dz (per Dr. Burt Knack, should have consideration of PCI of the occluded RCA based on symptoms and/or consideration of outpatient nuclear perfusion testing after the patient recovers from her infarct)  . Ischemic cardiomyopathy     EF 45% by cath 04/20/11, 50-55% by echo 04/22/11. hypotension limiting medication titration   . Hyperlipidemia   . Hyperglycemia   . Hypertension   . Back pain, chronic   . Myocardial infarction 2013  . UTI (urinary tract infection) 12/13/2013  . Arthritis     Past Surgical History  Procedure Laterality Date  . Breast lumpectomy      right  . Coronary stent placement    . Back surgery    . Left heart catheterization with coronary angiogram N/A 04/20/2011    Procedure: LEFT HEART CATHETERIZATION WITH CORONARY ANGIOGRAM;  Surgeon: Burnell Blanks, MD;  Location: [facility] CATH LAB;  Service: Cardiovascular;  Laterality: N/A;  . Percutaneous coronary stent intervention (pci-s) N/A 04/21/2011    Procedure: PERCUTANEOUS CORONARY STENT INTERVENTION (PCI-S);  Surgeon: Sherren Mocha, MD;  Location: [facility] CATH LAB;  Service: Cardiovascular;  Laterality: N/A;    Medications:  HOME MEDS: Prior to Admission medications   Medication Sig Start Date End Date Taking? Authorizing Provider  aspirin 81 MG chewable tablet Chew 81 mg by  mouth 2 (two) times daily.    Yes Historical Provider, MD  fish oil-omega-3 fatty acids 1000 MG capsule Take 1 g by mouth daily.   Yes Historical Provider, MD  glipiZIDE (GLUCOTROL) 5 MG tablet Take 0.5 tablets  (2.5 mg total) by mouth daily before breakfast. 12/16/13  Yes Verlee Monte, MD  HYDROcodone-acetaminophen ([place]) 7.5-325 MG per tablet Take 1 tablet by mouth every 4 (four) hours as needed. 03/29/14  Yes Historical Provider, MD  levothyroxine (SYNTHROID, LEVOTHROID) 112 MCG tablet Take 1 tablet (112 mcg total) by mouth every morning. 12/16/13  Yes Verlee Monte, MD  meclizine (ANTIVERT) 25 MG tablet Take 25 mg by mouth 4 (four) times daily as needed. Dizziness   Yes Historical Provider, MD  metoprolol tartrate (LOPRESSOR) 25 MG tablet Take 12.5 mg by mouth 2 (two) times daily. 04/23/11  Yes Dayna N Dunn, PA-C  nitroGLYCERIN (NITROSTAT) 0.4 MG SL tablet Place 0.4 mg under the tongue every 5 (five) minutes x 3 doses as needed. For chest pain. 04/23/11  Yes Dayna N Dunn, PA-C  ondansetron (ZOFRAN-ODT) 4 MG disintegrating tablet Take 4 mg by mouth every 8 (eight) hours as needed for nausea or vomiting.   Yes Historical Provider, MD  Vitamin D, Ergocalciferol, (DRISDOL) 50000 UNITS CAPS Take 50,000 Units by mouth 2 (two) times a week. Tuesdays and Thurdays 05/30/12  Yes Historical Provider, MD  Vortioxetine HBr 5 MG TABS Take 5 mg by mouth every morning.   Yes Historical Provider, MD  buprenorphine (BUTRANS - DOSED MCG/HR) 5 MCG/HR PTWK patch Place 5 mcg onto the skin once a week.    Historical Provider, MD  ciprofloxacin (CIPRO) 500 MG tablet Take 1 tablet (500 mg total) by mouth 2 (two) times daily. Patient not taking: Reported on 04/04/2014 12/16/13   Verlee Monte, MD  lisinopril (PRINIVIL,ZESTRIL) 2.5 MG tablet TAKE ONE (1) TABLET EACH DAY Patient not taking: Reported on 12/13/2013 03/15/13   Minus Breeding, MD     Allergies:  Allergies  Allergen Reactions  . Lipitor [Atorvastatin]   . Penicillins Rash    Social History:   reports that she has never smoked. She has never used smokeless tobacco. She reports that she does not drink alcohol or use illicit drugs.  Family History: Family History   Problem Relation Age of Onset  . Other      no known family cardiac disease     Physical Exam: Filed Vitals:   04/04/14 1830 04/04/14 1900 04/04/14 2100 04/04/14 2149  BP: 133/48 134/71 176/75 144/86  Pulse: 59 65 65 68  Temp:      TempSrc:      Resp: 11 14 14 17   Height:      Weight:      SpO2: 93% 97% 95% 95%   Blood pressure 144/86, pulse 68, temperature 98.7 F (37.1 C), temperature source Oral, resp. rate 17, height 5\' 2"  (1.575 m), weight 72.576 kg (160 lb), SpO2 95 %.  GEN:  Pleasant  patient lying in the stretcher in no acute distress; cooperative with exam. PSYCH:  alert and oriented x4; does not appear anxious but she appears  affect is appropriate. HEENT: Mucous membranes pink and anicteric; PERRLA; EOM intact; no cervical lymphadenopathy nor thyromegaly or carotid bruit; no JVD; There were no stridor. Neck is very supple. Breasts:: Not examined CHEST WALL: No tenderness CHEST: Normal respiration, clear to auscultation bilaterally.  HEART: Regular rate and rhythm.  There are no murmur, rub, or gallops.   BACK: No  kyphosis or scoliosis; no CVA tenderness ABDOMEN: soft and non-tender; no masses, no organomegaly, normal abdominal bowel sounds; no pannus; no intertriginous candida. There is no rebound and no distention. Rectal Exam: Not done EXTREMITIES: No bone or joint deformity; age-appropriate arthropathy of the hands and knees; no edema; no ulcerations.  There is no calf tenderness. Genitalia: not examined PULSES: 2+ and symmetric SKIN: Normal hydration no rash or ulceration CNS: Cranial nerves 2-12 grossly intact no focal lateralizing neurologic deficit.  Speech is fluent; uvula elevated with phonation, facial symmetry and tongue midline. DTR are normal bilaterally, cerebella exam is intact, barbinski is negative and strengths are equaled bilaterally.  No sensory loss.   Labs on Admission:  Basic Metabolic Panel:  Recent Labs Lab 04/04/14 1805  NA 135  K 4.5   CL 102  CO2 25  GLUCOSE 223*  BUN 35*  CREATININE 2.40*  CALCIUM 9.0   Liver Function Tests:  Recent Labs Lab 04/04/14 1805  AST 21  ALT 13  ALKPHOS 49  BILITOT 0.5  PROT 6.6  ALBUMIN 3.4*   CBC:  Recent Labs Lab 04/04/14 1805  WBC 4.9  NEUTROABS 3.2  HGB 10.9*  HCT 32.6*  MCV 95.0  PLT 174   Cardiac Enzymes:  Recent Labs Lab 04/04/14 1805  TROPONINI <0.03   Radiological Exams on Admission: Dg Chest 2 View  04/04/2014   CLINICAL DATA:  Failure to thrive.  EXAM: CHEST  2 VIEW  COMPARISON:  12/15/2013  FINDINGS: Artifact overlies the chest. The heart is enlarged. Stents are in place. Aortic atherosclerosis. The lungs are clear except for mild scarring at the left base. No effusion. Bony structures unremarkable.  IMPRESSION: Cardiomegaly. Coronary stents. Aortic atherosclerosis. Mild scarring left lower lobe.   Electronically Signed   By: Nelson Chimes M.D.   On: 04/04/2014 20:30   Ct Head Wo Contrast  04/04/2014   CLINICAL DATA:  Lethargy.  Loss of appetite.  Failure to thrive.  EXAM: CT HEAD WITHOUT CONTRAST  TECHNIQUE: Contiguous axial images were obtained from the base of the skull through the vertex without intravenous contrast.  COMPARISON:  12/13/2013  FINDINGS: The brain does not show accelerated atrophy for a age. There is no evidence of acute infarction, mass lesion, hemorrhage, hydrocephalus or extra-axial collection. Mild chronic appearing small vessel change of the hemispheric white matter. No calvarial abnormality. No fluid in the sinuses, middle ears or mastoids.  IMPRESSION: No change. No acute finding. Mild small vessel change of the hemispheric white matter.   Electronically Signed   By: Nelson Chimes M.D.   On: 04/04/2014 20:32   Dg Hip Unilat With Pelvis 2-3 Views Left  04/04/2014   CLINICAL DATA:  Weakness and dizziness.  Fell with left hip pain.  EXAM: LEFT HIP (WITH PELVIS) 2-3 VIEWS  COMPARISON:  None.  FINDINGS: There is osteoarthritis of the left hip.  No evidence of fracture or dislocation. Sacroiliac joints appear normal.  IMPRESSION: Osteoarthritis of the left hip.  No acute or traumatic finding.   Electronically Signed   By: Nelson Chimes M.D.   On: 04/04/2014 20:31   Assessment/Plan:  I think she is depressed and upset over the illness of her husband.  I believe her vague and non specific symptoms are indicative of depression.  I offered to admit her for observation, but I don't think there is any immediate intervention I can offer her.  She really would like to go home, and I think further evaluation and treatment  can be established as outpatient.  I discussed with both her daughters, and they agreed.  Spoke with Dr Roderic Palau as well.  Strict criteria was given to have her brought back to the ER.  Thank you for asking me to see her.  She was given IVF as well.   Other plans as per orders.  Code Status: FULL Haskel Khan, MD. [facility] Pager [phone] 7pm to 7am.  04/04/2014, 9:53 PM

## 2014-06-11 ENCOUNTER — Ambulatory Visit (INDEPENDENT_AMBULATORY_CARE_PROVIDER_SITE_OTHER): Payer: Medicare Other | Admitting: Cardiology

## 2014-06-11 ENCOUNTER — Encounter: Payer: Self-pay | Admitting: Cardiology

## 2014-06-11 VITALS — BP 102/60 | HR 60 | Ht 62.0 in | Wt 167.0 lb

## 2014-06-11 DIAGNOSIS — I2510 Atherosclerotic heart disease of native coronary artery without angina pectoris: Secondary | ICD-10-CM | POA: Diagnosis not present

## 2014-06-11 NOTE — Patient Instructions (Signed)
Medication Instructions:  Your physician recommends that you continue on your current medications as directed. Please refer to the Current Medication list given to you today.  Follow-Up: Follow up in 1 year with Dr. Hochrein.  You will receive a letter in the mail 2 months before you are due.  Please call us when you receive this letter to schedule your follow up appointment.   Thank you for choosing [facility]!!       

## 2014-06-11 NOTE — Progress Notes (Signed)
HPI The patient presents for follow up of CAD s/p PCI. Since I last saw her she was in the hospital with sepsis any urinary infection. This was late last year. She was in the emergency room last month. I do note that her creatinine was elevated to 2.4 area somewhere along the line her ACE inhibitor has been stopped.  I reviewed these emergency room records and this hospitalization thoroughly. Of note she did not want hospitalization in April. She did see her primary care yesterday and was felt to have another urinary infection. Her basic metabolic profile was apparently drawn yesterday and I don't see these results yet. The patient was sent here because she did have about 2 weeks ago one episode of chest discomfort. This was sharp. It was nonradiating. It was mild. She did take a nitroglycerin but it was somewhat brief. This was unlike her previous angina. She has since not had any of this discomfort. She does some light housekeeping without bringing on any symptoms.   Allergies  Allergen Reactions  . Lipitor [Atorvastatin]   . Penicillins Rash    Current Outpatient Prescriptions  Medication Sig Dispense Refill  . aspirin 81 MG chewable tablet Chew 81 mg by mouth 2 (two) times daily.     . ciprofloxacin (CIPRO) 500 MG tablet Take 1 tablet (500 mg total) by mouth 2 (two) times daily. 6 tablet 0  . citalopram (CELEXA) 10 MG tablet Take 10 mg by mouth daily. Take 1/2 tablet daily    . fish oil-omega-3 fatty acids 1000 MG capsule Take 1 g by mouth daily.    Marland Kitchen glipiZIDE (GLUCOTROL) 5 MG tablet Take 0.5 tablets (2.5 mg total) by mouth daily before breakfast. 30 tablet 0  . levothyroxine (SYNTHROID, LEVOTHROID) 112 MCG tablet Take 1 tablet (112 mcg total) by mouth every morning. 30 tablet 0  . meclizine (ANTIVERT) 25 MG tablet Take 25 mg by mouth 4 (four) times daily as needed. Dizziness    . metoprolol tartrate (LOPRESSOR) 25 MG tablet Take 25 mg by mouth 2 (two) times daily.     . nitroGLYCERIN  (NITROSTAT) 0.4 MG SL tablet Place 0.4 mg under the tongue every 5 (five) minutes x 3 doses as needed. For chest pain.    Marland Kitchen ondansetron (ZOFRAN-ODT) 4 MG disintegrating tablet Take 4 mg by mouth every 8 (eight) hours as needed for nausea or vomiting.    . Vitamin D, Ergocalciferol, (DRISDOL) 50000 UNITS CAPS Take 50,000 Units by mouth 2 (two) times a week. Tuesdays and Thurdays    . traMADol (ULTRAM) 50 MG tablet Take 50 mg by mouth 2 (two) times daily. As needed for back pain     No current facility-administered medications for this visit.    Past Medical History  Diagnosis Date  . Hypothyroidism   . CAD (coronary artery disease)     NSTEMI 04/2011 with newly diagnosed 3V CAD s/p PCI/DES mLAD 04/11/11 with residual dz (per Dr. Burt Knack, should have consideration of PCI of the occluded RCA based on symptoms and/or consideration of outpatient nuclear perfusion testing after the patient recovers from her infarct)  . Ischemic cardiomyopathy     EF 45% by cath 04/20/11, 50-55% by echo 04/22/11. hypotension limiting medication titration   . Hyperlipidemia   . Hyperglycemia   . Hypertension   . Back pain, chronic   . Myocardial infarction 2013  . UTI (urinary tract infection) 12/13/2013  . Arthritis     Past Surgical History  Procedure Laterality  Date  . Breast lumpectomy      right  . Coronary stent placement    . Back surgery    . Left heart catheterization with coronary angiogram N/A 04/20/2011    Procedure: LEFT HEART CATHETERIZATION WITH CORONARY ANGIOGRAM;  Surgeon: Burnell Blanks, MD;  Location: [facility] CATH LAB;  Service: Cardiovascular;  Laterality: N/A;  . Percutaneous coronary stent intervention (pci-s) N/A 04/21/2011    Procedure: PERCUTANEOUS CORONARY STENT INTERVENTION (PCI-S);  Surgeon: Sherren Mocha, MD;  Location: [facility] CATH LAB;  Service: Cardiovascular;  Laterality: N/A;    ROS:  As stated in the HPI and negative for all other systems.  PHYSICAL EXAM BP 102/60 mmHg  Pulse  60  Ht 5\' 2"  (1.575 m)  Wt 167 lb (75.751 kg)  BMI 30.54 kg/m2 GENERAL:  Well appearing NECK:  No jugular venous distention, waveform within normal limits, carotid upstroke brisk and symmetric, left bruit, no thyromegaly LYMPHATICS:  No cervical, inguinal adenopathy LUNGS:  Clear to auscultation bilaterally BACK:  No CVA tenderness CHEST:  Unremarkable HEART:  PMI not displaced or sustained,S1 and S2 within normal limits, no S3, no S4, no clicks, no rubs, brief apical systolic murmur heard best at the left upper sternal border, no diastolicmmurmurs ABD:  Flat, positive bowel sounds normal in frequency in pitch, no bruits, no rebound, no guarding, no midline pulsatile mass, no hepatomegaly, no splenomegaly EXT:  2 plus pulses upper, diminished DP/PT bilateral, no edema, no cyanosis no clubbing, right wrist with some bruising, right femoral without bruit or pulsatile mass.   ASSESSMENT AND PLAN  CAD:  I do not think that her previous symptoms were necessarily angina. Regardless she's not had any further symptoms in the last 2 weeks. Therefore, I don't think further cardiovascular testing is indicated.  ISCHEMIC CARDIOMYOPATHY:  She seems to be euvolemic.  She will continue the meds as listed.  No further imaging is indicated.   HYPERLIPIDEMIA.   I will defer to [facility], MD  HTN:  The blood pressure is actually running low. If it continues to run low she will let me know although she's not currently on any medications that I would necessarily adjust.  BRUIT:  She did have a carotid Doppler without any significant stenosis previously. No further imaging is indicated.  Hospital admission and ED records reviewed.

## 2014-07-25 ENCOUNTER — Inpatient Hospital Stay (HOSPITAL_COMMUNITY)
Admission: EM | Admit: 2014-07-25 | Discharge: 2014-08-02 | DRG: 644 | Disposition: A | Discharge status: Home / Self Care | Payer: Medicare Other | Attending: Internal Medicine | Admitting: Internal Medicine

## 2014-07-25 ENCOUNTER — Encounter (HOSPITAL_COMMUNITY): Payer: Self-pay | Admitting: Emergency Medicine

## 2014-07-25 ENCOUNTER — Emergency Department (HOSPITAL_COMMUNITY): Payer: Medicare Other

## 2014-07-25 DIAGNOSIS — E1165 Type 2 diabetes mellitus with hyperglycemia: Secondary | ICD-10-CM | POA: Diagnosis present

## 2014-07-25 DIAGNOSIS — E871 Hypo-osmolality and hyponatremia: Secondary | ICD-10-CM | POA: Diagnosis present

## 2014-07-25 DIAGNOSIS — E860 Dehydration: Secondary | ICD-10-CM | POA: Diagnosis present

## 2014-07-25 DIAGNOSIS — I2510 Atherosclerotic heart disease of native coronary artery without angina pectoris: Secondary | ICD-10-CM | POA: Diagnosis present

## 2014-07-25 DIAGNOSIS — E861 Hypovolemia: Secondary | ICD-10-CM | POA: Diagnosis present

## 2014-07-25 DIAGNOSIS — Z955 Presence of coronary angioplasty implant and graft: Secondary | ICD-10-CM

## 2014-07-25 DIAGNOSIS — E1121 Type 2 diabetes mellitus with diabetic nephropathy: Secondary | ICD-10-CM | POA: Diagnosis present

## 2014-07-25 DIAGNOSIS — B961 Klebsiella pneumoniae [K. pneumoniae] as the cause of diseases classified elsewhere: Secondary | ICD-10-CM | POA: Diagnosis present

## 2014-07-25 DIAGNOSIS — E785 Hyperlipidemia, unspecified: Secondary | ICD-10-CM | POA: Diagnosis present

## 2014-07-25 DIAGNOSIS — N184 Chronic kidney disease, stage 4 (severe): Secondary | ICD-10-CM | POA: Diagnosis present

## 2014-07-25 DIAGNOSIS — N390 Urinary tract infection, site not specified: Secondary | ICD-10-CM | POA: Diagnosis present

## 2014-07-25 DIAGNOSIS — R42 Dizziness and giddiness: Secondary | ICD-10-CM | POA: Diagnosis present

## 2014-07-25 DIAGNOSIS — N179 Acute kidney failure, unspecified: Secondary | ICD-10-CM | POA: Diagnosis present

## 2014-07-25 DIAGNOSIS — E222 Syndrome of inappropriate secretion of antidiuretic hormone: Secondary | ICD-10-CM | POA: Diagnosis not present

## 2014-07-25 DIAGNOSIS — E039 Hypothyroidism, unspecified: Secondary | ICD-10-CM | POA: Diagnosis present

## 2014-07-25 DIAGNOSIS — I252 Old myocardial infarction: Secondary | ICD-10-CM

## 2014-07-25 DIAGNOSIS — I129 Hypertensive chronic kidney disease with stage 1 through stage 4 chronic kidney disease, or unspecified chronic kidney disease: Secondary | ICD-10-CM | POA: Diagnosis present

## 2014-07-25 DIAGNOSIS — I255 Ischemic cardiomyopathy: Secondary | ICD-10-CM | POA: Diagnosis present

## 2014-07-25 DIAGNOSIS — R0989 Other specified symptoms and signs involving the circulatory and respiratory systems: Secondary | ICD-10-CM

## 2014-07-25 DIAGNOSIS — I10 Essential (primary) hypertension: Secondary | ICD-10-CM | POA: Diagnosis present

## 2014-07-25 DIAGNOSIS — D649 Anemia, unspecified: Secondary | ICD-10-CM | POA: Diagnosis present

## 2014-07-25 DIAGNOSIS — N183 Chronic kidney disease, stage 3 (moderate): Secondary | ICD-10-CM | POA: Diagnosis not present

## 2014-07-25 HISTORY — DX: Type 2 diabetes mellitus with diabetic nephropathy: E11.21

## 2014-07-25 LAB — CBC WITH DIFFERENTIAL/PLATELET
Basophils Absolute: 0 10*3/uL (ref 0.0–0.1)
Basophils Relative: 1 % (ref 0–1)
Eosinophils Absolute: 0.1 10*3/uL (ref 0.0–0.7)
Eosinophils Relative: 2 % (ref 0–5)
HCT: 32.6 % — ABNORMAL LOW (ref 36.0–46.0)
Hemoglobin: 11.5 g/dL — ABNORMAL LOW (ref 12.0–15.0)
Lymphocytes Relative: 18 % (ref 12–46)
Lymphs Abs: 1.4 10*3/uL (ref 0.7–4.0)
MCH: 31.8 pg (ref 26.0–34.0)
MCHC: 35.3 g/dL (ref 30.0–36.0)
MCV: 90.1 fL (ref 78.0–100.0)
MONOS PCT: 10 % (ref 3–12)
Monocytes Absolute: 0.8 10*3/uL (ref 0.1–1.0)
NEUTROS ABS: 5.4 10*3/uL (ref 1.7–7.7)
Neutrophils Relative %: 69 % (ref 43–77)
Platelets: 251 10*3/uL (ref 150–400)
RBC: 3.62 MIL/uL — AB (ref 3.87–5.11)
RDW: 12.2 % (ref 11.5–15.5)
WBC: 7.7 10*3/uL (ref 4.0–10.5)

## 2014-07-25 LAB — URINE MICROSCOPIC-ADD ON

## 2014-07-25 LAB — COMPREHENSIVE METABOLIC PANEL
ALBUMIN: 3.5 g/dL (ref 3.5–5.0)
ALT: 11 U/L — ABNORMAL LOW (ref 14–54)
AST: 20 U/L (ref 15–41)
Alkaline Phosphatase: 56 U/L (ref 38–126)
Anion gap: 8 (ref 5–15)
BUN: 20 mg/dL (ref 6–20)
CALCIUM: 8.4 mg/dL — AB (ref 8.9–10.3)
CO2: 23 mmol/L (ref 22–32)
CREATININE: 1.83 mg/dL — AB (ref 0.44–1.00)
Chloride: 88 mmol/L — ABNORMAL LOW (ref 101–111)
GFR calc Af Amer: 29 mL/min — ABNORMAL LOW (ref >60)
GFR calc non Af Amer: 25 mL/min — ABNORMAL LOW (ref >60)
Glucose, Bld: 168 mg/dL — ABNORMAL HIGH (ref 65–99)
Potassium: 4.3 mmol/L (ref 3.5–5.1)
Sodium: 119 mmol/L — CL (ref 135–145)
TOTAL PROTEIN: 6.6 g/dL (ref 6.5–8.1)
Total Bilirubin: 0.6 mg/dL (ref 0.3–1.2)

## 2014-07-25 LAB — URINALYSIS, ROUTINE W REFLEX MICROSCOPIC
Bilirubin Urine: NEGATIVE
GLUCOSE, UA: NEGATIVE mg/dL
Ketones, ur: NEGATIVE mg/dL
NITRITE: NEGATIVE
Protein, ur: NEGATIVE mg/dL
SPECIFIC GRAVITY, URINE: 1.01 (ref 1.005–1.030)
UROBILINOGEN UA: 0.2 mg/dL (ref 0.0–1.0)
pH: 6 (ref 5.0–8.0)

## 2014-07-25 LAB — BASIC METABOLIC PANEL
Anion gap: 8 (ref 5–15)
BUN: 18 mg/dL (ref 6–20)
CO2: 23 mmol/L (ref 22–32)
Calcium: 8.3 mg/dL — ABNORMAL LOW (ref 8.9–10.3)
Chloride: 90 mmol/L — ABNORMAL LOW (ref 101–111)
Creatinine, Ser: 1.78 mg/dL — ABNORMAL HIGH (ref 0.44–1.00)
GFR calc non Af Amer: 26 mL/min — ABNORMAL LOW (ref >60)
GFR, EST AFRICAN AMERICAN: 30 mL/min — AB (ref >60)
GLUCOSE: 167 mg/dL — AB (ref 65–99)
POTASSIUM: 4.1 mmol/L (ref 3.5–5.1)
Sodium: 121 mmol/L — ABNORMAL LOW (ref 135–145)

## 2014-07-25 LAB — I-STAT TROPONIN, ED: Troponin i, poc: 0 ng/mL (ref 0.00–0.08)

## 2014-07-25 MED ORDER — CITALOPRAM HYDROBROMIDE 10 MG PO TABS
5.0000 mg | ORAL_TABLET | Freq: Every day | ORAL | Status: DC
  Administered 2014-07-26 – 2014-07-29 (×4): 5 mg via ORAL
  Filled 2014-07-25 (×5): qty 1

## 2014-07-25 MED ORDER — SODIUM CHLORIDE 0.9 % IV BOLUS (SEPSIS)
500.0000 mL | Freq: Once | INTRAVENOUS | Status: AC
  Administered 2014-07-25: 500 mL via INTRAVENOUS

## 2014-07-25 MED ORDER — ACETAMINOPHEN 325 MG PO TABS: 650.0000 mg | ORAL_TABLET | Freq: Four times a day (QID) | ORAL | Status: DC | PRN

## 2014-07-25 MED ORDER — ONDANSETRON HCL 4 MG PO TABS: 4.0000 mg | ORAL_TABLET | Freq: Four times a day (QID) | ORAL | Status: DC | PRN

## 2014-07-25 MED ORDER — ONDANSETRON HCL 4 MG/2ML IJ SOLN
4.0000 mg | Freq: Four times a day (QID) | INTRAMUSCULAR | Status: DC | PRN
  Administered 2014-07-30: 4 mg via INTRAVENOUS
  Filled 2014-07-25: qty 2

## 2014-07-25 MED ORDER — METOPROLOL TARTRATE 25 MG PO TABS
25.0000 mg | ORAL_TABLET | Freq: Two times a day (BID) | ORAL | Status: DC
  Administered 2014-07-25 – 2014-07-28 (×6): 25 mg via ORAL
  Filled 2014-07-25 (×6): qty 1

## 2014-07-25 MED ORDER — ONDANSETRON HCL 4 MG/2ML IJ SOLN
4.0000 mg | Freq: Once | INTRAMUSCULAR | Status: AC
  Administered 2014-07-25: 4 mg via INTRAVENOUS
  Filled 2014-07-25: qty 2

## 2014-07-25 MED ORDER — LEVOTHYROXINE SODIUM 112 MCG PO TABS
112.0000 ug | ORAL_TABLET | Freq: Every day | ORAL | Status: DC
  Administered 2014-07-26 – 2014-08-02 (×8): 112 ug via ORAL
  Filled 2014-07-25 (×8): qty 1

## 2014-07-25 MED ORDER — SODIUM CHLORIDE 0.9 % IV SOLN
INTRAVENOUS | Status: DC
  Administered 2014-07-25 – 2014-07-31 (×6): via INTRAVENOUS

## 2014-07-25 MED ORDER — ENOXAPARIN SODIUM 30 MG/0.3ML ~~LOC~~ SOLN
30.0000 mg | SUBCUTANEOUS | Status: DC
  Administered 2014-07-25 – 2014-07-27 (×3): 30 mg via SUBCUTANEOUS
  Filled 2014-07-25 (×3): qty 0.3

## 2014-07-25 MED ORDER — SENNOSIDES-DOCUSATE SODIUM 8.6-50 MG PO TABS: 1.0000 | ORAL_TABLET | Freq: Every evening | ORAL | Status: DC | PRN

## 2014-07-25 MED ORDER — TRAMADOL HCL 50 MG PO TABS
50.0000 mg | ORAL_TABLET | Freq: Two times a day (BID) | ORAL | Status: DC
  Administered 2014-07-25 – 2014-07-26 (×2): 50 mg via ORAL
  Filled 2014-07-25 (×2): qty 1

## 2014-07-25 MED ORDER — ACETAMINOPHEN 650 MG RE SUPP: 650.0000 mg | Freq: Four times a day (QID) | RECTAL | Status: DC | PRN

## 2014-07-25 MED ORDER — ALUM & MAG HYDROXIDE-SIMETH 200-200-20 MG/5ML PO SUSP: 30.0000 mL | Freq: Four times a day (QID) | ORAL | Status: DC | PRN

## 2014-07-25 MED ORDER — CEFTRIAXONE SODIUM IN DEXTROSE 20 MG/ML IV SOLN
1.0000 g | INTRAVENOUS | Status: DC
  Administered 2014-07-26: 1 g via INTRAVENOUS
  Filled 2014-07-25 (×3): qty 50

## 2014-07-25 MED ORDER — ASPIRIN 81 MG PO CHEW
81.0000 mg | CHEWABLE_TABLET | Freq: Two times a day (BID) | ORAL | Status: DC
  Administered 2014-07-25 – 2014-08-02 (×16): 81 mg via ORAL
  Filled 2014-07-25 (×16): qty 1

## 2014-07-25 MED ORDER — DEXTROSE 5 % IV SOLN
INTRAVENOUS | Status: AC
  Filled 2014-07-25: qty 10

## 2014-07-25 MED ORDER — NITROGLYCERIN 0.4 MG SL SUBL: 0.4000 mg | SUBLINGUAL_TABLET | SUBLINGUAL | Status: DC | PRN

## 2014-07-25 NOTE — ED Notes (Signed)
Patient has had dizziness x1 moth in which she has been seen by PCP and given antivert 25mg  with no relief. Per patient no appetite with nausea and vomiting x2 weeks. Patient diagnosed with UTI 1 week ago and started on antibiotics, now c/o generalized weakness with the vomiting.

## 2014-07-25 NOTE — ED Notes (Signed)
119 sodium reported by lab.  Dr. Ralene Bathe notified.

## 2014-07-25 NOTE — ED Provider Notes (Signed)
CSN: [id number]     Arrival date & time 07/25/14  1500 History   First MD Initiated Contact with Patient 07/25/14 1521     Chief Complaint  Patient presents with  . Emesis     Patient is a 81 y.o. female presenting with vomiting. The history is provided by the patient. No language interpreter was used.  Emesis  Joann Hunter presents for evaluation of nausea and vomiting. History is provided by the patient and Joann Hunter daughter. Joann Hunter has a 1 month history of episodic dizziness described as room spinning sensation. The symptoms wax and wane Joann Hunter and are worse with activity. Joann Hunter has been on as needed meclizine without any improvement in Joann Hunter symptoms. A week ago Joann Hunter was seen by Joann Hunter PCP and started on Cipro for a UTI. Joann Hunter then developed nausea about 4-5 days ago. The last 2 days Joann Hunter's had emesis with all meals. Joann Hunter has progressive generalized weakness and Joann Hunter is afraid to get up due to dizziness. Symptoms are moderate and worsening.  Past Medical History  Diagnosis Date  . Hypothyroidism   . CAD (coronary artery disease)     NSTEMI 04/2011 with newly diagnosed 3V CAD s/p PCI/DES mLAD 04/11/11 with residual dz (per Dr. Burt Knack, should have consideration of PCI of the occluded RCA based on symptoms and/or consideration of outpatient nuclear perfusion testing after the patient recovers from Joann Hunter infarct)  . Ischemic cardiomyopathy     EF 45% by cath 04/20/11, 50-55% by echo 04/22/11. hypotension limiting medication titration   . Hyperlipidemia   . Hyperglycemia   . Hypertension   . Back pain, chronic   . Myocardial infarction 2013  . UTI (urinary tract infection) 12/13/2013  . Arthritis    Past Surgical History  Procedure Laterality Date  . Breast lumpectomy      right  . Coronary stent placement    . Back surgery    . Left heart catheterization with coronary angiogram N/A 04/20/2011    Procedure: LEFT HEART CATHETERIZATION WITH CORONARY ANGIOGRAM;  Surgeon: Burnell Blanks, MD;  Location: [facility] CATH  LAB;  Service: Cardiovascular;  Laterality: N/A;  . Percutaneous coronary stent intervention (pci-s) N/A 04/21/2011    Procedure: PERCUTANEOUS CORONARY STENT INTERVENTION (PCI-S);  Surgeon: Sherren Mocha, MD;  Location: [facility] CATH LAB;  Service: Cardiovascular;  Laterality: N/A;   Family History  Problem Relation Age of Onset  . Other      no known family cardiac disease   History  Substance Use Topics  . Smoking status: Never Smoker   . Smokeless tobacco: Never Used  . Alcohol Use: No   OB History    Gravida Para Term Preterm AB TAB SAB Ectopic Multiple Living   2 2 2       2      Review of Systems  Gastrointestinal: Positive for vomiting.  All other systems reviewed and are negative.     Allergies  Lipitor and Penicillins  Home Medications   Prior to Admission medications   Medication Sig Start Date End Date Taking? Authorizing Provider  aspirin 81 MG chewable tablet Chew 81 mg by mouth 2 (two) times daily.     Historical Provider, MD  ciprofloxacin (CIPRO) 500 MG tablet Take 500 mg by mouth 2 (two) times daily. 07/21/14   Historical Provider, MD  citalopram (CELEXA) 10 MG tablet Take 10 mg by mouth daily. Take 1/2 tablet daily    Historical Provider, MD  doxycycline (VIBRA-TABS) 100 MG tablet Take 100 mg by  mouth 2 (two) times daily. [place] ON 07/16/14 07/16/14   Historical Provider, MD  fish oil-omega-3 fatty acids 1000 MG capsule Take 1 g by mouth daily.    Historical Provider, MD  glipiZIDE (GLUCOTROL) 5 MG tablet Take 0.5 tablets (2.5 mg total) by mouth daily before breakfast. 12/16/13   Verlee Monte, MD  levothyroxine (SYNTHROID, LEVOTHROID) 112 MCG tablet Take 1 tablet (112 mcg total) by mouth every morning. 12/16/13   Verlee Monte, MD  meclizine (ANTIVERT) 25 MG tablet Take 25 mg by mouth 4 (four) times daily as needed. Dizziness    Historical Provider, MD  metoprolol tartrate (LOPRESSOR) 25 MG tablet Take 25 mg by mouth 2 (two) times daily.  04/23/11    Dayna N Dunn, PA-C  nitroGLYCERIN (NITROSTAT) 0.4 MG SL tablet Place 0.4 mg under the tongue every 5 (five) minutes x 3 doses as needed. For chest pain. 04/23/11   Dayna N Dunn, PA-C  ondansetron (ZOFRAN-ODT) 4 MG disintegrating tablet Take 4 mg by mouth every 8 (eight) hours as needed for nausea or vomiting.    Historical Provider, MD  traMADol (ULTRAM) 50 MG tablet Take 50 mg by mouth 2 (two) times daily. As needed for back pain 04/26/14   Historical Provider, MD  Vitamin D, Ergocalciferol, (DRISDOL) 50000 UNITS CAPS Take 50,000 Units by mouth 2 (two) times a week. Tuesdays and Thurdays 05/30/12   Historical Provider, MD   BP 151/60 mmHg  Pulse 57  Temp(Src) 98.8 F (37.1 C) (Oral)  Resp 18  Ht 5\' 3"  (1.6 m)  Wt 157 lb (71.215 kg)  BMI 27.82 kg/m2  SpO2 96% Physical Exam  Constitutional: Joann Hunter is oriented to person, place, and time. Joann Hunter appears well-developed and well-nourished.  HENT:  Head: Normocephalic and atraumatic.  Right Ear: External ear normal.  Left Ear: External ear normal.  Mouth/Throat: Oropharynx is clear and moist.  Eyes: EOM are normal. Pupils are equal, round, and reactive to light.  No nystagmus  Cardiovascular: Normal rate and regular rhythm.   No murmur heard. Pulmonary/Chest: Effort normal and breath sounds normal. No respiratory distress.  Abdominal: Soft. There is no tenderness. There is no rebound and no guarding.  Musculoskeletal: Joann Hunter exhibits no edema or tenderness.  Neurological: Joann Hunter is alert and oriented to person, place, and time.  5 out of 5 strength in all four extremities. Sensation to light touch intact in all four extremities.   Skin: Skin is warm and dry.  Psychiatric: Joann Hunter has a normal mood and affect. Joann Hunter behavior is normal.  Nursing note and vitals reviewed.   ED Course  Procedures (including critical care time) Labs Review Labs Reviewed  CBC WITH DIFFERENTIAL/PLATELET - Abnormal; Notable for the following:    RBC 3.62 (*)    Hemoglobin  11.5 (*)    HCT 32.6 (*)    All other components within normal limits  COMPREHENSIVE METABOLIC PANEL - Abnormal; Notable for the following:    Sodium 119 (*)    Chloride 88 (*)    Glucose, Bld 168 (*)    Creatinine, Ser 1.83 (*)    Calcium 8.4 (*)    ALT 11 (*)    GFR calc non Af Amer 25 (*)    GFR calc Af Amer 29 (*)    All other components within normal limits  URINALYSIS, ROUTINE W REFLEX MICROSCOPIC (NOT AT [facility])  Randolm Idol, ED    Imaging Review Dg Chest 2 View  07/25/2014   CLINICAL DATA:  Dizziness and nausea today and for several months ; history coronary artery disease post MI, ischemic cardiomyopathy, hypertension  EXAM: CHEST  2 VIEW  COMPARISON:  04/04/2014  FINDINGS: Enlargement of cardiac silhouette.  Atherosclerotic calcification.  Mediastinal contours normal.  Slight pulmonary vascular congestion.  Peribronchial thickening and accentuation of perihilar markings could represent bronchitis or subtle early failure.  Minimal RIGHT basilar atelectasis.  No pleural effusion or pneumothorax.  Diffuse osseous demineralization.  IMPRESSION: Enlargement of cardiac silhouette with pulmonary vascular congestion.  Peribronchial thickening and accentuated perihilar markings question bronchitis or potentially subtle early failure.  No segmental consolidation.   Electronically Signed   By: Lavonia Dana M.D.   On: 07/25/2014 16:54     EKG Interpretation   Date/Time:  Friday July 25 2014 15:18:07 EDT Ventricular Rate:  61 PR Interval:  221 QRS Duration: 73 QT Interval:  433 QTC Calculation: 436 R Axis:   16 Text Interpretation:  Sinus rhythm Prolonged PR interval Low voltage,  precordial leads Confirmed by Hazle Coca [phone]) on 07/25/2014 4:17:08 PM      MDM   Final diagnoses:  Acute hyponatremia   Patient here for evaluation of nausea, vomiting, dizziness. Patient appears mildly dehydrated on examination. Labs demonstrated acute hyponatremia with renal insufficiency,  unclear etiology. Plan to admit for further management. Discussed with patient and family findings of studies and need for admission. Hospitalist consulted for admission.   Quintella Reichert, MD 07/25/14 [id number]

## 2014-07-25 NOTE — H&P (Signed)
History and Physical  Joann Hunter:[id number] DOB: April 30, 1932 DOA: 07/25/2014  Referring physician: Dr Ralene Bathe, ED physician PCP: Monico Blitz, MD   Chief Complaint: Dizziness  HPI: Joann Hunter is a 81 y.o. female  With a history of coronary artery disease with an STEMI in 2013, hyperglycemia, hypertension, hyperlipidemia, chronic vertigo. Patient has been treated with ciprofloxacin over the past week due to a urinary tract infection. The patient has had worsening vertigo during this time to the point where he is unable to stand or walk due to worsening of her symptoms. Sitting and resting improve her vertigo. She has attempted to use meclizine, which has not improved her symptoms. No other peeling or provoking factors. Her symptoms are worsening.  Review of Systems:   Pt complains of nausea, decreased fluid intake, decreased appetite, confusion, visual hallucinations on ciprofloxacin  Pt denies any fevers, chills, abdominal pain, shortness of breath, chest pain, palpitations, headaches, blurred vision, diarrhea, constipation.  Review of systems are otherwise negative  Past Medical History  Diagnosis Date  . Hypothyroidism   . CAD (coronary artery disease)     NSTEMI 04/2011 with newly diagnosed 3V CAD s/p PCI/DES mLAD 04/11/11 with residual dz (per Dr. Burt Knack, should have consideration of PCI of the occluded RCA based on symptoms and/or consideration of outpatient nuclear perfusion testing after the patient recovers from her infarct)  . Ischemic cardiomyopathy     EF 45% by cath 04/20/11, 50-55% by echo 04/22/11. hypotension limiting medication titration   . Hyperlipidemia   . Hyperglycemia   . Hypertension   . Back pain, chronic   . Myocardial infarction 2013  . UTI (urinary tract infection) 12/13/2013  . Arthritis    Past Surgical History  Procedure Laterality Date  . Breast lumpectomy      right  . Coronary stent placement    . Back surgery    . Left heart  catheterization with coronary angiogram N/A 04/20/2011    Procedure: LEFT HEART CATHETERIZATION WITH CORONARY ANGIOGRAM;  Surgeon: Burnell Blanks, MD;  Location: [facility] CATH LAB;  Service: Cardiovascular;  Laterality: N/A;  . Percutaneous coronary stent intervention (pci-s) N/A 04/21/2011    Procedure: PERCUTANEOUS CORONARY STENT INTERVENTION (PCI-S);  Surgeon: Sherren Mocha, MD;  Location: [facility] CATH LAB;  Service: Cardiovascular;  Laterality: N/A;   Social History:  reports that she has never smoked. She has never used smokeless tobacco. She reports that she does not drink alcohol or use illicit drugs. Patient lives at home & is able to participate in activities of daily living  Allergies  Allergen Reactions  . Lipitor [Atorvastatin]   . Penicillins Rash    Family History  Problem Relation Age of Onset  . Other      no known family cardiac disease     Prior to Admission medications   Medication Sig Start Date End Date Taking? Authorizing Provider  aspirin 81 MG chewable tablet Chew 81 mg by mouth 2 (two) times daily.    Yes Historical Provider, MD  ciprofloxacin (CIPRO) 500 MG tablet Take 500 mg by mouth 2 (two) times daily. 10 day course starting 07/21/14 07/21/14  Yes Historical Provider, MD  citalopram (CELEXA) 10 MG tablet Take 5 mg by mouth at bedtime. Take 1/2 tablet daily   Yes Historical Provider, MD  fish oil-omega-3 fatty acids 1000 MG capsule Take 1 g by mouth daily.   Yes Historical Provider, MD  levothyroxine (SYNTHROID, LEVOTHROID) 112 MCG tablet Take 1 tablet (112 mcg total) by  mouth every morning. 12/16/13  Yes Verlee Monte, MD  meclizine (ANTIVERT) 25 MG tablet Take 25 mg by mouth 4 (four) times daily as needed. Dizziness   Yes Historical Provider, MD  metoprolol tartrate (LOPRESSOR) 25 MG tablet Take 25 mg by mouth 2 (two) times daily.  04/23/11  Yes Dayna N Dunn, PA-C  nitroGLYCERIN (NITROSTAT) 0.4 MG SL tablet Place 0.4 mg under the tongue every 5 (five) minutes x 3  doses as needed. For chest pain. 04/23/11  Yes Dayna N Dunn, PA-C  ondansetron (ZOFRAN-ODT) 4 MG disintegrating tablet Take 4 mg by mouth every 8 (eight) hours as needed for nausea or vomiting.   Yes Historical Provider, MD  traMADol (ULTRAM) 50 MG tablet Take 50 mg by mouth 2 (two) times daily. As needed for back pain 04/26/14  Yes Historical Provider, MD  Vitamin D, Ergocalciferol, (DRISDOL) 50000 UNITS CAPS Take 50,000 Units by mouth 2 (two) times a week. Tuesdays and Thurdays 05/30/12  Yes Historical Provider, MD    Physical Exam: BP 170/54 mmHg  Pulse 63  Temp(Src) 98 F (36.7 C) (Oral)  Resp 20  Ht 5\' 2"  (1.575 m)  Wt 76.567 kg (168 lb 12.8 oz)  BMI 30.87 kg/m2  SpO2 94%  General: Elderly Caucasian female. Awake and alert and oriented x3. No acute cardiopulmonary distress.  Eyes: Pupils equal, round, reactive to light. Extraocular muscles are intact. Sclerae anicteric and noninjected.  ENT:  Dry mucosal membranes. No mucosal lesions. Teeth in moderate repair  Neck: Neck supple without lymphadenopathy. No carotid bruits. No masses palpated.  Cardiovascular: Regular rate with normal S1-S2 sounds. No murmurs, rubs, gallops auscultated. No JVD.  Respiratory: Good respiratory effort with no wheezes, rales, rhonchi. Lungs clear to auscultation bilaterally.  Abdomen: Soft, nontender, nondistended. Active bowel sounds. No masses or hepatosplenomegaly  Skin: Dry, warm to touch. 2+ dorsalis pedis and radial pulses. Musculoskeletal: No calf or leg pain. All major joints not erythematous nontender.  Psychiatric: Intact judgment and insight.  Neurologic: No focal neurological deficits. Cranial nerves II through XII are grossly intact.           Labs on Admission:  Basic Metabolic Panel:  Recent Labs Lab 07/25/14 1520  NA 119*  K 4.3  CL 88*  CO2 23  GLUCOSE 168*  BUN 20  CREATININE 1.83*  CALCIUM 8.4*   Liver Function Tests:  Recent Labs Lab 07/25/14 1520  AST 20  ALT 11*    ALKPHOS 56  BILITOT 0.6  PROT 6.6  ALBUMIN 3.5   No results for input(s): LIPASE, AMYLASE in the last 168 hours. No results for input(s): AMMONIA in the last 168 hours. CBC:  Recent Labs Lab 07/25/14 1520  WBC 7.7  NEUTROABS 5.4  HGB 11.5*  HCT 32.6*  MCV 90.1  PLT 251   Cardiac Enzymes: No results for input(s): CKTOTAL, CKMB, CKMBINDEX, TROPONINI in the last 168 hours.  BNP (last 3 results) No results for input(s): BNP in the last 8760 hours.  CBG: No results for input(s): GLUCAP in the last 168 hours.  Radiological Exams on Admission: Dg Chest 2 View  07/25/2014   CLINICAL DATA:  Dizziness and nausea today and for several months ; history coronary artery disease post MI, ischemic cardiomyopathy, hypertension  EXAM: CHEST  2 VIEW  COMPARISON:  04/04/2014  FINDINGS: Enlargement of cardiac silhouette.  Atherosclerotic calcification.  Mediastinal contours normal.  Slight pulmonary vascular congestion.  Peribronchial thickening and accentuation of perihilar markings could represent bronchitis or subtle  early failure.  Minimal RIGHT basilar atelectasis.  No pleural effusion or pneumothorax.  Diffuse osseous demineralization.  IMPRESSION: Enlargement of cardiac silhouette with pulmonary vascular congestion.  Peribronchial thickening and accentuated perihilar markings question bronchitis or potentially subtle early failure.  No segmental consolidation.   Electronically Signed   By: Lavonia Dana M.D.   On: 07/25/2014 16:54    EKG: Independently reviewed. Sinus rhythm. Prolonged PR interval at 0.22. No STEMI  Assessment/Plan Present on Admission:  . Hyponatremia . UTI (lower urinary tract infection) . AKI (acute kidney injury)  This patient was discussed with the ED physician, including pertinent vitals, physical exam findings, labs, and imaging.  We also discussed care given by the ED provider.  #1 hyponatremia  Possibly due to dehydration  Admit  Check urine osmolality,  urine sodium, serum osmolality  Continue IV fluids  Recheck metabolic panel now and in the morning #2 urinary tract infection  Discontinue ciprofloxacin as she is having adverse side effects due to the ciprofloxacin - is been marked as a allergy in her medication list  Appears to to need to have bladder infection  Urine culture sent  Start ceftriaxone #3 vertigo  Likely due to hyponatremia and ciprofloxacin - will correct these  #4 acute renal injury  Fluids  DVT prophylaxis: Lovenox  Consultants: None  Code Status: Full code  Family Communication: Daughter and grandson in the room   Disposition Plan: Home following improvement    Truett Mainland, DO [facility] Pager [phone]

## 2014-07-26 ENCOUNTER — Encounter (HOSPITAL_COMMUNITY): Payer: Self-pay | Admitting: Internal Medicine

## 2014-07-26 DIAGNOSIS — E1121 Type 2 diabetes mellitus with diabetic nephropathy: Secondary | ICD-10-CM | POA: Diagnosis present

## 2014-07-26 DIAGNOSIS — I10 Essential (primary) hypertension: Secondary | ICD-10-CM | POA: Diagnosis present

## 2014-07-26 DIAGNOSIS — N390 Urinary tract infection, site not specified: Secondary | ICD-10-CM

## 2014-07-26 HISTORY — DX: Type 2 diabetes mellitus with diabetic nephropathy: E11.21

## 2014-07-26 LAB — GLUCOSE, CAPILLARY
Glucose-Capillary: 107 mg/dL — ABNORMAL HIGH (ref 65–99)
Glucose-Capillary: 108 mg/dL — ABNORMAL HIGH (ref 65–99)
Glucose-Capillary: 135 mg/dL — ABNORMAL HIGH (ref 65–99)

## 2014-07-26 LAB — CBC
HCT: 30.2 % — ABNORMAL LOW (ref 36.0–46.0)
Hemoglobin: 10.5 g/dL — ABNORMAL LOW (ref 12.0–15.0)
MCH: 31.6 pg (ref 26.0–34.0)
MCHC: 34.8 g/dL (ref 30.0–36.0)
MCV: 91 fL (ref 78.0–100.0)
PLATELETS: 205 10*3/uL (ref 150–400)
RBC: 3.32 MIL/uL — ABNORMAL LOW (ref 3.87–5.11)
RDW: 12.3 % (ref 11.5–15.5)
WBC: 5.7 10*3/uL (ref 4.0–10.5)

## 2014-07-26 LAB — BASIC METABOLIC PANEL
ANION GAP: 6 (ref 5–15)
BUN: 16 mg/dL (ref 6–20)
CO2: 25 mmol/L (ref 22–32)
CREATININE: 1.71 mg/dL — AB (ref 0.44–1.00)
Calcium: 8.6 mg/dL — ABNORMAL LOW (ref 8.9–10.3)
Chloride: 94 mmol/L — ABNORMAL LOW (ref 101–111)
GFR calc non Af Amer: 27 mL/min — ABNORMAL LOW (ref >60)
GFR, EST AFRICAN AMERICAN: 31 mL/min — AB (ref >60)
Glucose, Bld: 95 mg/dL (ref 65–99)
POTASSIUM: 4 mmol/L (ref 3.5–5.1)
SODIUM: 125 mmol/L — AB (ref 135–145)

## 2014-07-26 LAB — SODIUM, URINE, RANDOM: Sodium, Ur: 103 mmol/L

## 2014-07-26 LAB — TSH: TSH: 2.45 u[IU]/mL (ref 0.350–4.500)

## 2014-07-26 LAB — BRAIN NATRIURETIC PEPTIDE: B Natriuretic Peptide: 155 pg/mL — ABNORMAL HIGH (ref 0.0–100.0)

## 2014-07-26 LAB — URIC ACID: URIC ACID, SERUM: 4.9 mg/dL (ref 2.3–6.6)

## 2014-07-26 MED ORDER — INSULIN ASPART 100 UNIT/ML ~~LOC~~ SOLN
0.0000 [IU] | Freq: Every day | SUBCUTANEOUS | Status: DC
  Administered 2014-07-27 – 2014-07-29 (×2): 2 [IU] via SUBCUTANEOUS

## 2014-07-26 MED ORDER — DEXTROSE 5 % IV SOLN
1.0000 g | INTRAVENOUS | Status: DC
  Administered 2014-07-26 – 2014-07-29 (×4): 1 g via INTRAVENOUS
  Filled 2014-07-26 (×4): qty 10

## 2014-07-26 MED ORDER — TRAMADOL HCL 50 MG PO TABS: 50.0000 mg | ORAL_TABLET | Freq: Two times a day (BID) | ORAL | Status: DC | PRN

## 2014-07-26 MED ORDER — MECLIZINE HCL 12.5 MG PO TABS: 12.5000 mg | ORAL_TABLET | Freq: Three times a day (TID) | ORAL | Status: DC | PRN

## 2014-07-26 MED ORDER — INSULIN ASPART 100 UNIT/ML ~~LOC~~ SOLN
0.0000 [IU] | Freq: Three times a day (TID) | SUBCUTANEOUS | Status: DC
  Administered 2014-07-28 – 2014-07-29 (×3): 1 [IU] via SUBCUTANEOUS
  Administered 2014-07-30: 2 [IU] via SUBCUTANEOUS
  Administered 2014-07-30 – 2014-07-31 (×4): 1 [IU] via SUBCUTANEOUS
  Administered 2014-08-01: 2 [IU] via SUBCUTANEOUS
  Administered 2014-08-02: 1 [IU] via SUBCUTANEOUS

## 2014-07-26 NOTE — ED Notes (Signed)
Notified Dr. Ralene Bathe of facial droop.

## 2014-07-26 NOTE — Progress Notes (Signed)
[facility] PROGRESS NOTE  Joann Hunter KAJ:[id number] DOB: 1932-11-21 DOA: 07/25/2014 PCP: Monico Blitz, MD    Code Status: Full code Family Communication: Family not available; discussed with patient Disposition Plan: Discharge when clinically appropriate   Consultants:  None  Procedures:  None  Antibiotics:  Rocephin 7/22>>  HPI/Subjective: Patient denies dizziness currently. She has no complaints of headache, shortness of breath, or chest pain.  Objective: Filed Vitals:   07/26/14 0603  BP: 176/66  Pulse: 91  Temp: 99 F (37.2 C)  Resp: 20   oxygen saturation 94%.  Intake/Output Summary (Last 24 hours) at 07/26/14 1025 Last data filed at 07/26/14 0900  Gross per 24 hour  Intake    240 ml  Output     51 ml  Net    189 ml   Filed Weights   07/25/14 1508 07/25/14 1927  Weight: 71.215 kg (157 lb) 76.567 kg (168 lb 12.8 oz)    Exam:   General:  Pleasant alert elderly woman, in no acute distress.  Cardiovascular: S1, S2, with a soft systolic murmur.  Respiratory: Decreased breath sounds in the bases, otherwise clear. Breathing is nonlabored.  Abdomen: Positive bowel sounds, soft, nontender, nondistended.  Musculoskeletal/extremities: No pedal edema. No acute hot red joints.  Neurologic: The patient is alert and oriented 2. Cranial nerves II through XII are grossly intact. Speech is clear.   Data Reviewed: Basic Metabolic Panel:  Recent Labs Lab 07/25/14 1520 07/25/14 2111 07/26/14 0609  NA 119* 121* 125*  K 4.3 4.1 4.0  CL 88* 90* 94*  CO2 23 23 25   GLUCOSE 168* 167* 95  BUN 20 18 16   CREATININE 1.83* 1.78* 1.71*  CALCIUM 8.4* 8.3* 8.6*   Liver Function Tests:  Recent Labs Lab 07/25/14 1520  AST 20  ALT 11*  ALKPHOS 56  BILITOT 0.6  PROT 6.6  ALBUMIN 3.5   No results for input(s): LIPASE, AMYLASE in the last 168 hours. No results for input(s): AMMONIA in the last 168 hours. CBC:  Recent Labs Lab 07/25/14 1520  07/26/14 0609  WBC 7.7 5.7  NEUTROABS 5.4  --   HGB 11.5* 10.5*  HCT 32.6* 30.2*  MCV 90.1 91.0  PLT 251 205   Cardiac Enzymes: No results for input(s): CKTOTAL, CKMB, CKMBINDEX, TROPONINI in the last 168 hours. BNP (last 3 results)  Recent Labs  07/26/14 0557  BNP 155.0*    ProBNP (last 3 results)  Recent Labs  12/15/13 1245  PROBNP 2030.0*    CBG: No results for input(s): GLUCAP in the last 168 hours.  No results found for this or any previous visit (from the past 240 hour(s)).   Studies: Dg Chest 2 View  07/25/2014   CLINICAL DATA:  Dizziness and nausea today and for several months ; history coronary artery disease post MI, ischemic cardiomyopathy, hypertension  EXAM: CHEST  2 VIEW  COMPARISON:  04/04/2014  FINDINGS: Enlargement of cardiac silhouette.  Atherosclerotic calcification.  Mediastinal contours normal.  Slight pulmonary vascular congestion.  Peribronchial thickening and accentuation of perihilar markings could represent bronchitis or subtle early failure.  Minimal RIGHT basilar atelectasis.  No pleural effusion or pneumothorax.  Diffuse osseous demineralization.  IMPRESSION: Enlargement of cardiac silhouette with pulmonary vascular congestion.  Peribronchial thickening and accentuated perihilar markings question bronchitis or potentially subtle early failure.  No segmental consolidation.   Electronically Signed   By: Lavonia Dana M.D.   On: 07/25/2014 16:54    Scheduled Meds: . aspirin  81  mg Oral BID  . cefTRIAXone (ROCEPHIN)  IV  1 g Intravenous Q24H  . citalopram  5 mg Oral QHS  . enoxaparin (LOVENOX) injection  30 mg Subcutaneous Q24H  . insulin aspart  0-5 Units Subcutaneous QHS  . insulin aspart  0-9 Units Subcutaneous TID WC  . levothyroxine  112 mcg Oral QAC breakfast  . metoprolol tartrate  25 mg Oral BID  . traMADol  50 mg Oral BID   Continuous Infusions: . sodium chloride 100 mL/hr at 07/26/14 6945    Assessment and plan:  Principal  Problem:   Hyponatremia Active Problems:   Vertigo   Cardiomyopathy, ischemic   AKI (acute kidney injury)   UTI (lower urinary tract infection)   CKD (chronic kidney disease), stage III   Essential hypertension   Type 2 diabetes with nephropathy   1. Hyponatremia. The patient's serum sodium was 119 on admission. She was started on IV fluids with normal saline. Her serum sodium has improved progressively, but is still low. -Urine and sodium osmolality results are pending. -Her TSH is within normal limits. Her uric acid is within normal limits, making SIADH less likely. (Of note she is on Celexa which can also cause hyponatremia). -Given the improvement with normal saline, the etiology is likely hypovolemia in the setting of recent urinary tract infection. We'll continue normal saline.  Vertigo. Patient has a history of vertigo, but her presenting dizziness was likely from hyponatremia. We'll restart when necessary meclizine and continued normal saline infusion. She denies vertigo currently.  UTI. The patient was apparently treated with Cipro recently for UTI. Per history, she did not tolerate it. Another urinalysis in the ED revealed infection. She was started on Rocephin.  CAD with ischemic cardiomyopathy. No current evidence of decompensation. We'll continue metoprolol, aspirin, and sublingual nitroglycerin when necessary.  Essential hypertension. The patient is treated with metoprolol chronically. It has been continued. Her blood pressure is trending up, which may be secondary to normal saline infusion. We'll add when necessary hydralazine.  Hyperglycemia. The patient is venous glucose was 168 on admission. Her past medical history mentions diabetes, but the patient was unaware of that and she is not on any medications for treatment.  We'll continue sliding scale NovoLog started. Will order hemoglobin A1c.  Mild acute kidney injury in the setting of probable stage III to  stage IV chronic kidney disease. The patient's creatinine was 1.83 on admission. Per chart review, her creatinine was 2.40, three months ago and it was 1.45 seven months ago. -We'll continue to hydrate and follow her renal function.   Time spent: 35 minutes    [facility] Pager [phone]. If 7PM-7AM, please contact night-coverage at [email or web address], password [facility] 07/26/2014, 10:25 AM  LOS: 1 day

## 2014-07-27 ENCOUNTER — Inpatient Hospital Stay (HOSPITAL_COMMUNITY): Payer: Medicare Other

## 2014-07-27 LAB — CBC
HEMATOCRIT: 28.9 % — AB (ref 36.0–46.0)
Hemoglobin: 10 g/dL — ABNORMAL LOW (ref 12.0–15.0)
MCH: 31.6 pg (ref 26.0–34.0)
MCHC: 34.6 g/dL (ref 30.0–36.0)
MCV: 91.5 fL (ref 78.0–100.0)
Platelets: 198 10*3/uL (ref 150–400)
RBC: 3.16 MIL/uL — ABNORMAL LOW (ref 3.87–5.11)
RDW: 12.4 % (ref 11.5–15.5)
WBC: 4.9 10*3/uL (ref 4.0–10.5)

## 2014-07-27 LAB — OSMOLALITY, URINE: Osmolality, Ur: 356 mOsm/kg — ABNORMAL LOW (ref 390–1090)

## 2014-07-27 LAB — OSMOLALITY: Osmolality: 259 mOsm/kg — ABNORMAL LOW (ref 275–300)

## 2014-07-27 LAB — BASIC METABOLIC PANEL
ANION GAP: 5 (ref 5–15)
BUN: 12 mg/dL (ref 6–20)
CO2: 24 mmol/L (ref 22–32)
Calcium: 8.3 mg/dL — ABNORMAL LOW (ref 8.9–10.3)
Chloride: 99 mmol/L — ABNORMAL LOW (ref 101–111)
Creatinine, Ser: 1.64 mg/dL — ABNORMAL HIGH (ref 0.44–1.00)
GFR calc Af Amer: 33 mL/min — ABNORMAL LOW (ref >60)
GFR calc non Af Amer: 28 mL/min — ABNORMAL LOW (ref >60)
Glucose, Bld: 109 mg/dL — ABNORMAL HIGH (ref 65–99)
Potassium: 4 mmol/L (ref 3.5–5.1)
Sodium: 128 mmol/L — ABNORMAL LOW (ref 135–145)

## 2014-07-27 LAB — GLUCOSE, CAPILLARY
GLUCOSE-CAPILLARY: 111 mg/dL — AB (ref 65–99)
GLUCOSE-CAPILLARY: 156 mg/dL — AB (ref 65–99)
GLUCOSE-CAPILLARY: 148 mg/dL — AB (ref 65–99)
GLUCOSE-CAPILLARY: 223 mg/dL — AB (ref 65–99)

## 2014-07-27 MED ORDER — HYDRALAZINE HCL 20 MG/ML IJ SOLN: 5.0000 mg | INTRAMUSCULAR | Status: DC | PRN

## 2014-07-27 NOTE — Progress Notes (Signed)
[facility] PROGRESS NOTE  Joann Hunter KWI:[id number] DOB: [birth year] DOA: 07/25/2014 PCP: Monico Blitz, MD    Code Status: Full code Family Communication: Family not available; discussed with patient Disposition Plan: Discharge when clinically appropriate   Consultants:  None  Procedures:  None  Antibiotics:  Rocephin 7/22>>  HPI/Subjective: Patient denies dizziness, chest pain, shortness of breath, or headache. No pain with urination.  Objective: Filed Vitals:   07/27/14 0507  BP: 161/64  Pulse: 62  Temp: 98.3 F (36.8 C)  Resp: 20   oxygen saturation 95%.  Intake/Output Summary (Last 24 hours) at 07/27/14 1251 Last data filed at 07/27/14 0900  Gross per 24 hour  Intake   2790 ml  Output   1300 ml  Net   1490 ml   Filed Weights   07/25/14 1508 07/25/14 1927  Weight: 71.215 kg (157 lb) 76.567 kg (168 lb 12.8 oz)    Exam:   General:  Pleasant alert elderly woman, in no acute distress.  Cardiovascular: S1, S2, with a soft systolic murmur.  Respiratory: A few scattered crackles. Breathing is nonlabored at rest.  Abdomen: Positive bowel sounds, soft, nontender, nondistended.  Musculoskeletal/extremities: No pedal edema. No acute hot red joints.  Neurologic: The patient is alert and oriented 2. Cranial nerves II through XII are grossly intact. Speech is clear.   Data Reviewed: Basic Metabolic Panel:  Recent Labs Lab 07/25/14 1520 07/25/14 2111 07/26/14 0609 07/27/14 0611  NA 119* 121* 125* 128*  K 4.3 4.1 4.0 4.0  CL 88* 90* 94* 99*  CO2 23 23 25 24   GLUCOSE 168* 167* 95 109*  BUN 20 18 16 12   CREATININE 1.83* 1.78* 1.71* 1.64*  CALCIUM 8.4* 8.3* 8.6* 8.3*   Liver Function Tests:  Recent Labs Lab 07/25/14 1520  AST 20  ALT 11*  ALKPHOS 56  BILITOT 0.6  PROT 6.6  ALBUMIN 3.5   No results for input(s): LIPASE, AMYLASE in the last 168 hours. No results for input(s): AMMONIA in the last 168 hours. CBC:  Recent  Labs Lab 07/25/14 1520 07/26/14 0609 07/27/14 0611  WBC 7.7 5.7 4.9  NEUTROABS 5.4  --   --   HGB 11.5* 10.5* 10.0*  HCT 32.6* 30.2* 28.9*  MCV 90.1 91.0 91.5  PLT 251 205 198   Cardiac Enzymes: No results for input(s): CKTOTAL, CKMB, CKMBINDEX, TROPONINI in the last 168 hours. BNP (last 3 results)  Recent Labs  07/26/14 0557  BNP 155.0*    ProBNP (last 3 results)  Recent Labs  12/15/13 1245  PROBNP 2030.0*    CBG:  Recent Labs Lab 07/26/14 1116 07/26/14 1654 07/26/14 2340 07/27/14 0736 07/27/14 1130  GLUCAP 135* 108* 107* 111* 156*    No results found for this or any previous visit (from the past 240 hour(s)).   Studies: Dg Chest 2 View  07/25/2014   CLINICAL DATA:  Dizziness and nausea today and for several months ; history coronary artery disease post MI, ischemic cardiomyopathy, hypertension  EXAM: CHEST  2 VIEW  COMPARISON:  04/04/2014  FINDINGS: Enlargement of cardiac silhouette.  Atherosclerotic calcification.  Mediastinal contours normal.  Slight pulmonary vascular congestion.  Peribronchial thickening and accentuation of perihilar markings could represent bronchitis or subtle early failure.  Minimal RIGHT basilar atelectasis.  No pleural effusion or pneumothorax.  Diffuse osseous demineralization.  IMPRESSION: Enlargement of cardiac silhouette with pulmonary vascular congestion.  Peribronchial thickening and accentuated perihilar markings question bronchitis or potentially subtle early failure.  No segmental consolidation.  Electronically Signed   By: Lavonia Dana M.D.   On: 07/25/2014 16:54    Scheduled Meds: . aspirin  81 mg Oral BID  . cefTRIAXone (ROCEPHIN)  IV  1 g Intravenous Q24H  . citalopram  5 mg Oral QHS  . enoxaparin (LOVENOX) injection  30 mg Subcutaneous Q24H  . insulin aspart  0-5 Units Subcutaneous QHS  . insulin aspart  0-9 Units Subcutaneous TID WC  . levothyroxine  112 mcg Oral QAC breakfast  . metoprolol tartrate  25 mg Oral BID    Continuous Infusions: . sodium chloride 100 mL/hr at 07/26/14 2130    Assessment and plan:  Principal Problem:   Hyponatremia Active Problems:   Vertigo   Cardiomyopathy, ischemic   AKI (acute kidney injury)   UTI (lower urinary tract infection)   CKD (chronic kidney disease), stage III   Essential hypertension   Type 2 diabetes with nephropathy   1. Hyponatremia. The patient's serum sodium was 119 on admission. She was started on IV fluids with normal saline. Her serum sodium has improved progressively, but is still low. - Sodium osmolality is slightly low. -BNP is only 155 -Her TSH is within normal limits. Her uric acid is within normal limits, making SIADH less likely. (Of note she is on Celexa which can also cause hyponatremia). -Given the improvement with normal saline, the etiology is likely hypovolemia in the setting of recent urinary tract infection. We'll continue normal saline.  Pulmonary crackles/chest congestion. Patient noted to have a few more crackles on exam this morning. Given the IV fluid hydration, this is slightly concerning, although, her chest x-ray on admission revealed some peribronchial thickening. -Patient is asymptomatic and she is oxygenating in the 90s on room air. -We'll decrease IV fluid rate down a little and check a follow-up chest x-ray. -If no evidence of pulmonary edema, will start nebulizers as needed.  Vertigo. Patient has a history of vertigo, but her presenting dizziness was likely from hyponatremia. When necessary meclizine ordered. Continue normal saline as stated above. She denies vertigo currently.  UTI. The patient was apparently treated with Cipro recently for UTI. Per history, she did not tolerate it. Another urinalysis in the ED revealed infection. She was started on Rocephin. Urine culture is pending.  CAD with ischemic cardiomyopathy.  We'll continue metoprolol, aspirin, and sublingual nitroglycerin when  necessary. Chest x-ray pending for pulmonary edema.  Essential hypertension. The patient is treated with metoprolol chronically. It has been continued. Her blood pressure is trending up, which may be secondary to normal saline infusion. We'll add when necessary hydralazine. Consider increasing the dose of metoprolol.  Hyperglycemia. The patient is venous glucose was 168 on admission. Her past medical history mentions diabetes, but the patient was unaware of that and she is not on any medications for treatment.  We'll continue sliding scale NovoLog started. Hemoglobin A1c is pending.  Mild acute kidney injury in the setting of probable stage III to stage IV chronic kidney disease. The patient's creatinine was 1.83 on admission. Per chart review, her creatinine was 2.40, three months ago and it was 1.45 seven months ago. -We'll continue to hydrate and follow her renal function.   Time spent: 30 minutes    [facility] Pager [phone]. If 7PM-7AM, please contact night-coverage at [email or web address], password [facility] 07/27/2014, 12:51 PM  LOS: 2 days

## 2014-07-28 LAB — GLUCOSE, CAPILLARY
Glucose-Capillary: 108 mg/dL — ABNORMAL HIGH (ref 65–99)
Glucose-Capillary: 133 mg/dL — ABNORMAL HIGH (ref 65–99)
Glucose-Capillary: 136 mg/dL — ABNORMAL HIGH (ref 65–99)
Glucose-Capillary: 134 mg/dL — ABNORMAL HIGH (ref 65–99)

## 2014-07-28 LAB — CBC
HEMATOCRIT: 30.4 % — AB (ref 36.0–46.0)
HEMOGLOBIN: 10.5 g/dL — AB (ref 12.0–15.0)
MCH: 31.3 pg (ref 26.0–34.0)
MCHC: 34.5 g/dL (ref 30.0–36.0)
MCV: 90.7 fL (ref 78.0–100.0)
Platelets: 197 10*3/uL (ref 150–400)
RBC: 3.35 MIL/uL — ABNORMAL LOW (ref 3.87–5.11)
RDW: 12.3 % (ref 11.5–15.5)
WBC: 5.5 10*3/uL (ref 4.0–10.5)

## 2014-07-28 LAB — BASIC METABOLIC PANEL
ANION GAP: 6 (ref 5–15)
Anion gap: 6 (ref 5–15)
BUN: 10 mg/dL (ref 6–20)
BUN: 9 mg/dL (ref 6–20)
CALCIUM: 8.3 mg/dL — AB (ref 8.9–10.3)
CO2: 24 mmol/L (ref 22–32)
CO2: 24 mmol/L (ref 22–32)
CREATININE: 1.29 mg/dL — AB (ref 0.44–1.00)
Calcium: 8.4 mg/dL — ABNORMAL LOW (ref 8.9–10.3)
Chloride: 99 mmol/L — ABNORMAL LOW (ref 101–111)
Chloride: 97 mmol/L — ABNORMAL LOW (ref 101–111)
Creatinine, Ser: 1.38 mg/dL — ABNORMAL HIGH (ref 0.44–1.00)
GFR calc non Af Amer: 38 mL/min — ABNORMAL LOW (ref >60)
GFR, EST AFRICAN AMERICAN: 44 mL/min — AB (ref >60)
GFR, EST AFRICAN AMERICAN: 41 mL/min — AB (ref >60)
GFR, EST NON AFRICAN AMERICAN: 35 mL/min — AB (ref >60)
GLUCOSE: 109 mg/dL — AB (ref 65–99)
Glucose, Bld: 181 mg/dL — ABNORMAL HIGH (ref 65–99)
POTASSIUM: 4 mmol/L (ref 3.5–5.1)
Potassium: 3.9 mmol/L (ref 3.5–5.1)
SODIUM: 127 mmol/L — AB (ref 135–145)
Sodium: 129 mmol/L — ABNORMAL LOW (ref 135–145)

## 2014-07-28 LAB — HEMOGLOBIN A1C
HEMOGLOBIN A1C: 6.4 % — AB (ref 4.8–5.6)
MEAN PLASMA GLUCOSE: 137 mg/dL

## 2014-07-28 MED ORDER — METOPROLOL TARTRATE 25 MG PO TABS
37.5000 mg | ORAL_TABLET | Freq: Two times a day (BID) | ORAL | Status: DC
  Administered 2014-07-28 – 2014-08-02 (×10): 37.5 mg via ORAL
  Filled 2014-07-28 (×10): qty 2

## 2014-07-28 MED ORDER — ENOXAPARIN SODIUM 40 MG/0.4ML ~~LOC~~ SOLN
40.0000 mg | SUBCUTANEOUS | Status: DC
  Administered 2014-07-28 – 2014-08-01 (×5): 40 mg via SUBCUTANEOUS
  Filled 2014-07-28 (×5): qty 0.4

## 2014-07-28 MED ORDER — FUROSEMIDE 10 MG/ML IJ SOLN
20.0000 mg | Freq: Once | INTRAMUSCULAR | Status: AC
  Administered 2014-07-28: 20 mg via INTRAVENOUS
  Filled 2014-07-28: qty 2

## 2014-07-28 MED ORDER — LEVALBUTEROL HCL 0.63 MG/3ML IN NEBU
0.6300 mg | INHALATION_SOLUTION | Freq: Four times a day (QID) | RESPIRATORY_TRACT | Status: DC | PRN
Start: 2014-07-28 — End: 2014-08-02

## 2014-07-28 NOTE — Evaluation (Signed)
Physical Therapy Evaluation Patient Details Name: Joann Hunter MRN: [id number] DOB: May 09, 1932 Today's Date: 07/28/2014   History of Present Illness  With a history of coronary artery disease with an STEMI in 2013, hyperglycemia, hypertension, hyperlipidemia, chronic vertigo. Patient has been treated with ciprofloxacin over the past week due to a urinary tract infection. The patient has had worsening vertigo during this time to the point where he is unable to stand or walk due to worsening of her symptoms. Sitting and resting improve her vertigo. She has attempted to use meclizine, which has not improved her symptoms. No other peeling or provoking factors. Her symptoms are worsening.  She is found to have hyponatremia which is most probably causing this problem.  Clinical Impression   Pt was seen for evaluation.  She is alert and oriented, very cooperative.  Normally, she is independent at home with no assistive device.  She drives a car, lives with husband (who is currently at [facility]) and children.  Her dizziness has resolved, her strength is WNL as is her balance.  She is mildly deconditioned from baseline, only able to ambulate about 56' with no assistive device.  Since she is normally active at home this situation should improve over time at home.  She is considering whether she would like to have HHPT at d/c.    Follow Up Recommendations Other (comment) (have offered HHPT to pt for helping to increase endurance but she is not sure she wants it..she is to consider this for now)    Equipment Recommendations  None recommended by PT    Recommendations for Other Services   none    Precautions / Restrictions Precautions Precautions: None Restrictions Weight Bearing Restrictions: No      Mobility  Bed Mobility Overal bed mobility: Independent                Transfers Overall transfer level: Independent Equipment used: None                 Ambulation/Gait Ambulation/Gait assistance: Supervision Ambulation Distance (Feet): 50 Feet (x 2 laps) Assistive device: None Gait Pattern/deviations: WFL(Within Functional Limits)   Gait velocity interpretation: at or above normal speed for age/gender    Stairs            Wheelchair Mobility    Modified Rankin (Stroke Patients Only)       Balance Overall balance assessment: No apparent balance deficits (not formally assessed)                                           Pertinent Vitals/Pain Pain Assessment: No/denies pain    Home Living Family/patient expects to be discharged to:: Private residence Living Arrangements: Spouse/significant other;Children Available Help at Discharge: Family;Available 24 hours/day Type of Home: House Home Access: Stairs to enter Entrance Stairs-Rails: None Entrance Stairs-Number of Steps: 1 Home Layout: One level Home Equipment: Walker - 2 wheels      Prior Function Level of Independence: Independent               Hand Dominance        Extremity/Trunk Assessment               Lower Extremity Assessment: Overall WFL for tasks assessed (deconditioned from baseline)      Cervical / Trunk Assessment: Kyphotic  Communication   Communication: No difficulties  Cognition Arousal/Alertness:  Awake/alert Behavior During Therapy: WFL for tasks assessed/performed Overall Cognitive Status: Within Functional Limits for tasks assessed                      General Comments      Exercises        Assessment/Plan    PT Assessment Patient needs continued PT services  PT Diagnosis  (deconditioning)   PT Problem List Decreased activity tolerance;Decreased mobility  PT Treatment Interventions Gait training;Stair training   PT Goals (Current goals can be found in the Care Plan section) Acute Rehab PT Goals Patient Stated Goal: none stated PT Goal Formulation: With patient Time For Goal  Achievement: 08/04/14 Potential to Achieve Goals: Good    Frequency Min 3X/week   Barriers to discharge   none    Co-evaluation               End of Session Equipment Utilized During Treatment: Gait belt Activity Tolerance: Patient tolerated treatment well Patient left: in chair;with call bell/phone within reach           Time: 0914-0947 PT Time Calculation (min) (ACUTE ONLY): 33 min   Charges:   PT Evaluation $Initial PT Evaluation Tier I: 1 Procedure     PT G CodesDemetrios Isaacs L  PT 07/28/2014, 9:56 AM [phone]

## 2014-07-28 NOTE — Care Management Note (Signed)
Case Management Note  Patient Details  Name: Joann Hunter MRN: [id number] Date of Birth: 1932-01-31  Subjective/Objective:                  Pt admitted from home with hyponatremia. Pt lives with her husband and daughter and will return home at discharge. Pt has a cane and walker for home use. Pt stated that she is fairly independent with ADL's.  Action/Plan: PT recommends HH PT but pt is declining at this time. Pt is aware that if she changes her mind CM will be glad to arrange prior to discharge.  Expected Discharge Date:                  Expected Discharge Plan:  Home/Self Care  In-House Referral:  NA  Discharge planning Services  CM Consult  Post Acute Care Choice:  NA Choice offered to:  NA  DME Arranged:    DME Agency:     HH Arranged:    HH Agency:     Status of Service:  Completed, signed off  Medicare Important Message Given:    Date Medicare IM Given:    Medicare IM give by:    Date Additional Medicare IM Given:    Additional Medicare Important Message give by:     If discussed at [place] of Stay Meetings, dates discussed:    Additional Comments:  Joylene Draft, RN 07/28/2014, 10:32 AM

## 2014-07-28 NOTE — Care Management Important Message (Signed)
Important Message  Patient Details  Name: JISSEL SLAVENS MRN: [id number] Date of Birth: 12-12-1932   Medicare Important Message Given:  Yes-second notification given    Joylene Draft, RN 07/28/2014, 10:33 AM

## 2014-07-28 NOTE — Progress Notes (Signed)
[facility] PROGRESS NOTE  Joann Hunter YYQ:[id number] DOB: 1932/09/29 DOA: 07/25/2014 PCP: Monico Blitz, MD    Code Status: Full code Family Communication: Discussed with daughter, Debbie Disposition Plan: Discharge when clinically appropriate, hopefully in the next 24-48 hours   Consultants:  None  Procedures:  None  Antibiotics:  Rocephin 7/22>>  HPI/Subjective: Patient denies denies abdominal pain, nausea, and vomiting. She denies pain with urination. Her appetite is improving.  Objective: Filed Vitals:   07/28/14 1246  BP: 176/52  Pulse: 58  Temp: 98.6 F (37 C)  Resp: 18   oxygen saturation 99%.  Intake/Output Summary (Last 24 hours) at 07/28/14 1639 Last data filed at 07/28/14 1200  Gross per 24 hour  Intake   2336 ml  Output      0 ml  Net   2336 ml   Filed Weights   07/25/14 1508 07/25/14 1927  Weight: 71.215 kg (157 lb) 76.567 kg (168 lb 12.8 oz)    Exam:   General:  Pleasant alert elderly woman, in no acute distress.  Cardiovascular: S1, S2, with a soft systolic murmur.  Respiratory: Lungs mostly clear. Breathing is nonlabored at rest.  Abdomen: Positive bowel sounds, soft, nontender, nondistended.  Musculoskeletal/extremities: No pedal edema. No acute hot red joints.  Neurologic: The patient is alert and oriented 2. Cranial nerves II through XII are grossly intact. Speech is clear.   Data Reviewed: Basic Metabolic Panel:  Recent Labs Lab 07/25/14 2111 07/26/14 0609 07/27/14 0611 07/28/14 0655 07/28/14 1433  NA 121* 125* 128* 129* 127*  K 4.1 4.0 4.0 3.9 4.0  CL 90* 94* 99* 99* 97*  CO2 23 25 24 24 24   GLUCOSE 167* 95 109* 109* 181*  BUN 18 16 12 10 9   CREATININE 1.78* 1.71* 1.64* 1.29* 1.38*  CALCIUM 8.3* 8.6* 8.3* 8.4* 8.3*   Liver Function Tests:  Recent Labs Lab 07/25/14 1520  AST 20  ALT 11*  ALKPHOS 56  BILITOT 0.6  PROT 6.6  ALBUMIN 3.5   No results for input(s): LIPASE, AMYLASE in the last 168  hours. No results for input(s): AMMONIA in the last 168 hours. CBC:  Recent Labs Lab 07/25/14 1520 07/26/14 0609 07/27/14 0611 07/28/14 0655  WBC 7.7 5.7 4.9 5.5  NEUTROABS 5.4  --   --   --   HGB 11.5* 10.5* 10.0* 10.5*  HCT 32.6* 30.2* 28.9* 30.4*  MCV 90.1 91.0 91.5 90.7  PLT 251 205 198 197   Cardiac Enzymes: No results for input(s): CKTOTAL, CKMB, CKMBINDEX, TROPONINI in the last 168 hours. BNP (last 3 results)  Recent Labs  07/26/14 0557  BNP 155.0*    ProBNP (last 3 results)  Recent Labs  12/15/13 1245  PROBNP 2030.0*    CBG:  Recent Labs Lab 07/27/14 1130 07/27/14 1613 07/27/14 2044 07/28/14 0732 07/28/14 1147  GLUCAP 156* 148* 223* 108* 133*    Recent Results (from the past 240 hour(s))  Culture, Urine     Status: None (Preliminary result)   Collection Time: 07/26/14  6:00 PM  Result Value Ref Range Status   Specimen Description URINE, CLEAN CATCH  Final   Special Requests NONE  Final   Culture   Final    >=100,000 COLONIES/mL GRAM NEGATIVE RODS Performed at [facility]    Report Status PENDING  Incomplete     Studies: Dg Chest 2 View  07/27/2014   CLINICAL DATA:  Generalized weakness and chest congestion over the past several days. Current history  of diabetes, hypertension, coronary artery disease with MI and cardiomyopathy.  EXAM: CHEST  2 VIEW  COMPARISON:  07/25/2014 and earlier.  FINDINGS: Cardiac silhouette mildly enlarged, unchanged. Thoracic aorta atherosclerotic, unchanged. Hilar and mediastinal contours otherwise unremarkable. Minimal pulmonary venous hypertension without overt edema. Minimal linear atelectasis in the lingula. Lungs otherwise clear. No localized airspace consolidation. No pleural effusions. No pneumothorax. Degenerative changes involving the thoracic spine. Note made of a compression fracture of the upper endplate of what I believe is L2.  IMPRESSION: Stable mild cardiomegaly. Minimal linear atelectasis in the  lingula. No acute cardiopulmonary disease otherwise.   Electronically Signed   By: Evangeline Dakin M.D.   On: 07/27/2014 13:51    Scheduled Meds: . aspirin  81 mg Oral BID  . cefTRIAXone (ROCEPHIN)  IV  1 g Intravenous Q24H  . citalopram  5 mg Oral QHS  . enoxaparin (LOVENOX) injection  40 mg Subcutaneous Q24H  . furosemide  20 mg Intravenous Once  . insulin aspart  0-5 Units Subcutaneous QHS  . insulin aspart  0-9 Units Subcutaneous TID WC  . levothyroxine  112 mcg Oral QAC breakfast  . metoprolol tartrate  25 mg Oral BID   Continuous Infusions: . sodium chloride 150 mL/hr at 07/28/14 1433    Assessment and plan:  Principal Problem:   Hyponatremia Active Problems:   Vertigo   Cardiomyopathy, ischemic   AKI (acute kidney injury)   UTI (lower urinary tract infection)   CKD (chronic kidney disease), stage III   Essential hypertension   Type 2 diabetes with nephropathy   1. Hyponatremia. The patient's serum sodium was 119 on admission. She was started on IV fluids with normal saline. Her serum sodium has been improving progressively, but it decreased to 127 today. - Sodium osmolality was slightly low. -BNP was only 155 -Her TSH was within normal limits. Her uric acid is within normal limits, making SIADH less likely. (Of note she is on Celexa which can also cause hyponatremia. Per her daughter, Celexa was started approximately one month ago). -Given the improvement with normal saline, the etiology was likely hypovolemia in the setting of recent urinary tract infection and some reported vomiting at home. -Because her sodium has fallen a little after increasing the rate of normal saline, will decrease the rate and give her 1 dose of IV Lasix to try to decrease free water. -We'll liberalize her sodium intake. Pulmonary crackles/chest congestion. Patient noted to have a few more crackles on exam 7/24. -Follow-up chest x-ray was ordered and revealed no acute cardiopulmonary  disease and minimal linear atelectasis in the lingula. -I Xopenex was ordered as needed. -Her lungs are clear today.  Vertigo. Patient has a history of vertigo, but her presenting dizziness was likely from hyponatremia. When necessary meclizine was ordered. She denies vertigo with improvement in her serum sodium.  UTI. The patient was apparently treated with Cipro recently for UTI. Per history, she did not tolerate it. Another urinalysis in the ED revealed infection. She was started on Rocephin. Urine culture is growing gram-negative rods; identification pending.  CAD with ischemic cardiomyopathy.  We'll continue metoprolol, aspirin, and sublingual nitroglycerin when necessary. Chest x-ray on 7/24 revealed no pulmonary edema.  Essential hypertension. The patient is treated with metoprolol chronically. It has been continued. Her blood pressure is trending up, which may be secondary to normal saline infusion. -We'll decrease the dose of normal saline. We'll give her one IV dose of Lasix as stated above. -When necessary hydralazine was  ordered. We'll increase the dose of metoprolol to 37.5 mg twice a day.  Hyperglycemia-type 2 diabetes mellitus. The patient is venous glucose was 168 on admission. Her past medical history mentions diabetes, but the patient was unaware of that and she is not on any medications for treatment.  We'll continue sliding scale NovoLog started. Hemoglobin A1c was 6.4.  We'll change her diet. Would favor treating her with diet alone for now.  Mild acute kidney injury in the setting of probable stage III to stage IV chronic kidney disease. The patient's creatinine was 1.83 on admission. Per chart review, her creatinine was 2.40, three months ago and it was 1.45 seven months ago. -With IV fluid hydration, her renal function has improved progressively..   Time spent: 30 minutes    [facility] Pager [phone]. If 7PM-7AM, please contact  night-coverage at [email or web address], password [facility] 07/28/2014, 4:39 PM  LOS: 3 days

## 2014-07-29 DIAGNOSIS — I255 Ischemic cardiomyopathy: Secondary | ICD-10-CM

## 2014-07-29 LAB — BASIC METABOLIC PANEL
Anion gap: 8 (ref 5–15)
BUN: 8 mg/dL (ref 6–20)
CALCIUM: 8.5 mg/dL — AB (ref 8.9–10.3)
CO2: 25 mmol/L (ref 22–32)
Chloride: 95 mmol/L — ABNORMAL LOW (ref 101–111)
Creatinine, Ser: 1.39 mg/dL — ABNORMAL HIGH (ref 0.44–1.00)
GFR calc Af Amer: 40 mL/min — ABNORMAL LOW (ref >60)
GFR calc non Af Amer: 35 mL/min — ABNORMAL LOW (ref >60)
Glucose, Bld: 113 mg/dL — ABNORMAL HIGH (ref 65–99)
POTASSIUM: 3.9 mmol/L (ref 3.5–5.1)
Sodium: 128 mmol/L — ABNORMAL LOW (ref 135–145)

## 2014-07-29 LAB — GLUCOSE, CAPILLARY
Glucose-Capillary: 120 mg/dL — ABNORMAL HIGH (ref 65–99)
Glucose-Capillary: 146 mg/dL — ABNORMAL HIGH (ref 65–99)
Glucose-Capillary: 116 mg/dL — ABNORMAL HIGH (ref 65–99)
Glucose-Capillary: 214 mg/dL — ABNORMAL HIGH (ref 65–99)

## 2014-07-29 LAB — CBC
HCT: 30.4 % — ABNORMAL LOW (ref 36.0–46.0)
Hemoglobin: 10.8 g/dL — ABNORMAL LOW (ref 12.0–15.0)
MCH: 32 pg (ref 26.0–34.0)
MCHC: 35.5 g/dL (ref 30.0–36.0)
MCV: 89.9 fL (ref 78.0–100.0)
Platelets: 228 10*3/uL (ref 150–400)
RBC: 3.38 MIL/uL — ABNORMAL LOW (ref 3.87–5.11)
RDW: 12.4 % (ref 11.5–15.5)
WBC: 5.1 10*3/uL (ref 4.0–10.5)

## 2014-07-29 MED ORDER — AMLODIPINE BESYLATE 5 MG PO TABS
5.0000 mg | ORAL_TABLET | Freq: Every day | ORAL | Status: DC
  Administered 2014-07-29 – 2014-07-30 (×2): 5 mg via ORAL
  Filled 2014-07-29 (×2): qty 1

## 2014-07-29 NOTE — Progress Notes (Signed)
Physical Therapy Treatment Patient Details Name: Joann Hunter MRN: [id number] DOB: 03/01/1932 Today's Date: 07/29/2014    History of Present Illness With a history of coronary artery disease with an STEMI in 2013, hyperglycemia, hypertension, hyperlipidemia, chronic vertigo. Patient has been treated with ciprofloxacin over the past week due to a urinary tract infection. The patient has had worsening vertigo during this time to the point where he is unable to stand or walk due to worsening of her symptoms. Sitting and resting improve her vertigo. She has attempted to use meclizine, which has not improved her symptoms. No other peeling or provoking factors. Her symptoms are worsening.    PT Comments    Pt was found lying comfortably in bed with no c/o.  She was able to tolerate therapeutic exercise supine in the bed with no difficulty.  She transfers OOB with no difficulty.  Her ambulatory endurance is significantly increased today, able to ambulate at least 110' with no instability.  She should transition well to home.  Follow Up Recommendations   (pt will probably do well with no further PT)     Equipment Recommendations  None recommended by PT    Recommendations for Other Services  none     Precautions / Restrictions Precautions Precautions: None Restrictions Weight Bearing Restrictions: No    Mobility  Bed Mobility Overal bed mobility: Independent                Transfers Overall transfer level: Independent Equipment used: None                Ambulation/Gait Ambulation/Gait assistance: Supervision Ambulation Distance (Feet): 110 Feet Assistive device: None Gait Pattern/deviations: WFL(Within Functional Limits)   Gait velocity interpretation: at or above normal speed for age/gender     Stairs            Wheelchair Mobility    Modified Rankin (Stroke Patients Only)       Balance Overall balance assessment: No apparent balance deficits  (not formally assessed)                                  Cognition Arousal/Alertness: Awake/alert Behavior During Therapy: WFL for tasks assessed/performed Overall Cognitive Status: Within Functional Limits for tasks assessed                      Exercises General Exercises - Lower Extremity Ankle Circles/Pumps: AROM;Both;10 reps;Supine Quad Sets: AROM;Both;10 reps;Supine Gluteal Sets: AROM;Both;10 reps;Supine Short Arc Quad: AROM;Both;10 reps;Supine Heel Slides: AROM;Both;10 reps;Supine    General Comments        Pertinent Vitals/Pain Pain Assessment: No/denies pain    Home Living                      Prior Function            PT Goals (current goals can now be found in the care plan section) Progress towards PT goals: Progressing toward goals    Frequency  Min 3X/week    PT Plan Current plan remains appropriate    Co-evaluation             End of Session Equipment Utilized During Treatment: Gait belt Activity Tolerance: Patient tolerated treatment well Patient left: in chair;with call bell/phone within reach     Time: 1015-1039 PT Time Calculation (min) (ACUTE ONLY): 24 min  Charges:  $Gait Training: 8-22 mins $Therapeutic  Exercise: 8-22 mins                    G Codes:      Sable Feil  PT 07/29/2014, 10:47 AM [phone]

## 2014-07-29 NOTE — Consult Note (Signed)
Reason for Consult: Hyponatremia and renal failure Referring Physician: Dr. Rudie Meyer is an 81 y.o. female.  HPI: She is a patient was history of hypertension, ischemic cardiomyopathy, diabetes and history of vertigo presently came with complaints of weakness, unable to walk and worsening of her vertigo for about a week. Today she states that she is feeling better. She denies any dizziness or lightheadedness the last day or so. She doesn't have also any nausea or vomiting.  Past Medical History  Diagnosis Date  . Hypothyroidism   . CAD (coronary artery disease)     NSTEMI 04/2011 with newly diagnosed 3V CAD s/p PCI/DES mLAD 04/11/11 with residual dz (per Dr. Burt Knack, should have consideration of PCI of the occluded RCA based on symptoms and/or consideration of outpatient nuclear perfusion testing after the patient recovers from her infarct)  . Ischemic cardiomyopathy     EF 45% by cath 04/20/11, 50-55% by echo 04/22/11. hypotension limiting medication titration   . Hyperlipidemia   . Hyperglycemia   . Hypertension   . Back pain, chronic   . Myocardial infarction 2013  . UTI (urinary tract infection) 12/13/2013  . Arthritis   . Type 2 diabetes with nephropathy 07/26/2014    Past Surgical History  Procedure Laterality Date  . Breast lumpectomy      right  . Coronary stent placement    . Back surgery    . Left heart catheterization with coronary angiogram N/A 04/20/2011    Procedure: LEFT HEART CATHETERIZATION WITH CORONARY ANGIOGRAM;  Surgeon: Burnell Blanks, MD;  Location: [facility] CATH LAB;  Service: Cardiovascular;  Laterality: N/A;  . Percutaneous coronary stent intervention (pci-s) N/A 04/21/2011    Procedure: PERCUTANEOUS CORONARY STENT INTERVENTION (PCI-S);  Surgeon: Sherren Mocha, MD;  Location: [facility] CATH LAB;  Service: Cardiovascular;  Laterality: N/A;    Family History  Problem Relation Age of Onset  . Other      no known family cardiac disease    Social  History:  reports that she has never smoked. She has never used smokeless tobacco. She reports that she does not drink alcohol or use illicit drugs.  Allergies:  Allergies  Allergen Reactions  . Ciprofloxacin Other (See Comments)    Hallucination  . Lipitor [Atorvastatin]   . Penicillins Rash    Medications: I have reviewed the patient's current medications.  Results for orders placed or performed during the hospital encounter of 07/25/14 (from the past 48 hour(s))  Glucose, capillary     Status: Abnormal   Collection Time: 07/27/14 11:30 AM  Result Value Ref Range   Glucose-Capillary 156 (H) 65 - 99 mg/dL  Glucose, capillary     Status: Abnormal   Collection Time: 07/27/14  4:13 PM  Result Value Ref Range   Glucose-Capillary 148 (H) 65 - 99 mg/dL   Comment 1 Notify RN    Comment 2 Document in Chart   Glucose, capillary     Status: Abnormal   Collection Time: 07/27/14  8:44 PM  Result Value Ref Range   Glucose-Capillary 223 (H) 65 - 99 mg/dL   Comment 1 Notify RN    Comment 2 Document in Chart   Basic metabolic panel     Status: Abnormal   Collection Time: 07/28/14  6:55 AM  Result Value Ref Range   Sodium 129 (L) 135 - 145 mmol/L   Potassium 3.9 3.5 - 5.1 mmol/L   Chloride 99 (L) 101 - 111 mmol/L   CO2 24 22 -  32 mmol/L   Glucose, Bld 109 (H) 65 - 99 mg/dL   BUN 10 6 - 20 mg/dL   Creatinine, Ser 1.29 (H) 0.44 - 1.00 mg/dL   Calcium 8.4 (L) 8.9 - 10.3 mg/dL   GFR calc non Af Amer 38 (L) >60 mL/min   GFR calc Af Amer 44 (L) >60 mL/min    Comment: (NOTE) The eGFR has been calculated using the CKD EPI equation. This calculation has not been validated in all clinical situations. eGFR's persistently <60 mL/min signify possible Chronic Kidney Disease.    Anion gap 6 5 - 15  CBC     Status: Abnormal   Collection Time: 07/28/14  6:55 AM  Result Value Ref Range   WBC 5.5 4.0 - 10.5 K/uL   RBC 3.35 (L) 3.87 - 5.11 MIL/uL   Hemoglobin 10.5 (L) 12.0 - 15.0 g/dL   HCT 30.4  (L) 36.0 - 46.0 %   MCV 90.7 78.0 - 100.0 fL   MCH 31.3 26.0 - 34.0 pg   MCHC 34.5 30.0 - 36.0 g/dL   RDW 12.3 11.5 - 15.5 %   Platelets 197 150 - 400 K/uL  Glucose, capillary     Status: Abnormal   Collection Time: 07/28/14  7:32 AM  Result Value Ref Range   Glucose-Capillary 108 (H) 65 - 99 mg/dL   Comment 1 Notify RN    Comment 2 Document in Chart   Glucose, capillary     Status: Abnormal   Collection Time: 07/28/14 11:47 AM  Result Value Ref Range   Glucose-Capillary 133 (H) 65 - 99 mg/dL   Comment 1 Notify RN    Comment 2 Document in Chart   Basic metabolic panel     Status: Abnormal   Collection Time: 07/28/14  2:33 PM  Result Value Ref Range   Sodium 127 (L) 135 - 145 mmol/L   Potassium 4.0 3.5 - 5.1 mmol/L   Chloride 97 (L) 101 - 111 mmol/L   CO2 24 22 - 32 mmol/L   Glucose, Bld 181 (H) 65 - 99 mg/dL   BUN 9 6 - 20 mg/dL   Creatinine, Ser 1.38 (H) 0.44 - 1.00 mg/dL   Calcium 8.3 (L) 8.9 - 10.3 mg/dL   GFR calc non Af Amer 35 (L) >60 mL/min   GFR calc Af Amer 41 (L) >60 mL/min    Comment: (NOTE) The eGFR has been calculated using the CKD EPI equation. This calculation has not been validated in all clinical situations. eGFR's persistently <60 mL/min signify possible Chronic Kidney Disease.    Anion gap 6 5 - 15  Glucose, capillary     Status: Abnormal   Collection Time: 07/28/14  4:33 PM  Result Value Ref Range   Glucose-Capillary 136 (H) 65 - 99 mg/dL   Comment 1 Notify RN    Comment 2 Document in Chart   Glucose, capillary     Status: Abnormal   Collection Time: 07/28/14  8:52 PM  Result Value Ref Range   Glucose-Capillary 134 (H) 65 - 99 mg/dL  Basic metabolic panel     Status: Abnormal   Collection Time: 07/29/14  6:04 AM  Result Value Ref Range   Sodium 128 (L) 135 - 145 mmol/L   Potassium 3.9 3.5 - 5.1 mmol/L   Chloride 95 (L) 101 - 111 mmol/L   CO2 25 22 - 32 mmol/L   Glucose, Bld 113 (H) 65 - 99 mg/dL   BUN 8 6 - 20  mg/dL   Creatinine, Ser 1.39  (H) 0.44 - 1.00 mg/dL   Calcium 8.5 (L) 8.9 - 10.3 mg/dL   GFR calc non Af Amer 35 (L) >60 mL/min   GFR calc Af Amer 40 (L) >60 mL/min    Comment: (NOTE) The eGFR has been calculated using the CKD EPI equation. This calculation has not been validated in all clinical situations. eGFR's persistently <60 mL/min signify possible Chronic Kidney Disease.    Anion gap 8 5 - 15  CBC     Status: Abnormal   Collection Time: 07/29/14  6:04 AM  Result Value Ref Range   WBC 5.1 4.0 - 10.5 K/uL   RBC 3.38 (L) 3.87 - 5.11 MIL/uL   Hemoglobin 10.8 (L) 12.0 - 15.0 g/dL   HCT 30.4 (L) 36.0 - 46.0 %   MCV 89.9 78.0 - 100.0 fL   MCH 32.0 26.0 - 34.0 pg   MCHC 35.5 30.0 - 36.0 g/dL   RDW 12.4 11.5 - 15.5 %   Platelets 228 150 - 400 K/uL  Glucose, capillary     Status: Abnormal   Collection Time: 07/29/14  7:22 AM  Result Value Ref Range   Glucose-Capillary 120 (H) 65 - 99 mg/dL   Comment 1 Notify RN    Comment 2 Document in Chart     Dg Chest 2 View  07/27/2014   CLINICAL DATA:  Generalized weakness and chest congestion over the past several days. Current history of diabetes, hypertension, coronary artery disease with MI and cardiomyopathy.  EXAM: CHEST  2 VIEW  COMPARISON:  07/25/2014 and earlier.  FINDINGS: Cardiac silhouette mildly enlarged, unchanged. Thoracic aorta atherosclerotic, unchanged. Hilar and mediastinal contours otherwise unremarkable. Minimal pulmonary venous hypertension without overt edema. Minimal linear atelectasis in the lingula. Lungs otherwise clear. No localized airspace consolidation. No pleural effusions. No pneumothorax. Degenerative changes involving the thoracic spine. Note made of a compression fracture of the upper endplate of what I believe is L2.  IMPRESSION: Stable mild cardiomegaly. Minimal linear atelectasis in the lingula. No acute cardiopulmonary disease otherwise.   Electronically Signed   By: Evangeline Dakin M.D.   On: 07/27/2014 13:51    Review of Systems   Constitutional: Positive for malaise/fatigue. Negative for fever.  Respiratory: Negative for shortness of breath.   Cardiovascular: Negative for orthopnea and leg swelling.  Gastrointestinal: Negative for nausea and vomiting.  Neurological: Positive for weakness.   Blood pressure 184/68, pulse 72, temperature 99.1 F (37.3 C), temperature source Oral, resp. rate 18, height $RemoveBe'5\' 2"'dCoTmOKbm$  (1.575 m), weight 168 lb 12.8 oz (76.567 kg), SpO2 97 %. Physical Exam  Constitutional: She is oriented to person, place, and time. No distress.  Eyes: No scleral icterus.  Neck: No JVD present.  Cardiovascular: Normal rate and regular rhythm.   Respiratory: No respiratory distress. She has no wheezes. She has no rales.  Musculoskeletal: She exhibits no tenderness.  Neurological: She is alert and oriented to person, place, and time.    Assessment/Plan: Problem #1 hyponatremia: Recurrent and long-standing. Presently her sodium is 127. Her workup on different days showed low serum osmolality indicating a true hyponatremia. Her urine smelled T history 356 and urine sodium is 103 . Patient is also euvolomic. Hence the etiology seems to be possibly SIADH. Presently her sodium remains low at 128. Problem #2 chronic renal failure: Her creatinine was 1.02 on 4/23 2013 with a GFR of 52 mL/m. Her creatinine increased to 1.23 with a GFR of 41 mL/m on 12/07/2011.  Since then her renal function was fluctuating. Hence patient was stage III chronic renal failure. Etiology could be secondary to diabetes/hypertension/ischemic/age-related decline. Patient is asymptomatic. Problem #3 ischemic cardiomyopathy: Patient does not have any sign of CHF. Problem #4 urea tract infection Problem #5 hypertension: Her blood pressure seems to be poorly controlled Problem #6 diabetes Plan: 1] We'll start patient on amlodipine 5 mg by mouth daily 2] will put patient on free water resection and no Ice. Discussed with nursing staff 3] will check her  basic metabolic panel in the morning 4] will see if patient has previous workup for her chronic renal failure from her primary care physician.  Edee Nifong S 07/29/2014, 9:34 AM

## 2014-07-29 NOTE — Progress Notes (Signed)
[facility] PROGRESS NOTE  Joann Hunter JGO:[id number] DOB: 18-May-1932 DOA: 07/25/2014 PCP: Monico Blitz, MD    Code Status: Full code Family Communication: Discussed with daughter, [name] on 7/27. Disposition Plan: Discharge when clinically appropriate.   Consultants:  Nephrology  Procedures:  None  Antibiotics:  Rocephin 7/22>>  HPI/Subjective: Patient was seen earlier ambulating with the physical therapist in the hallway. She denies chest pain, shortness of breath, or dizziness.  Objective: Filed Vitals:   07/29/14 1118  BP: 189/63  Pulse: 51  Temp:   Resp:    temperature 99.1. Blood pressure 189/63. Oxygen saturation 97%. Respiratory rate 18.  Intake/Output Summary (Last 24 hours) at 07/29/14 1254 Last data filed at 07/29/14 0800  Gross per 24 hour  Intake 2577.17 ml  Output      0 ml  Net 2577.17 ml   Filed Weights   07/25/14 1508 07/25/14 1927  Weight: 71.215 kg (157 lb) 76.567 kg (168 lb 12.8 oz)    Exam:   General:  Pleasant alert elderly woman, in no acute distress.  Cardiovascular: S1, S2, with a soft systolic murmur.  Respiratory: Lungs mostly clear. Breathing is nonlabored at rest.  Abdomen: Positive bowel sounds, soft, nontender, nondistended.  Musculoskeletal/extremities: No pedal edema. No acute hot red joints.  Neurologic: The patient is alert and oriented 2. Cranial nerves II through XII are grossly intact. Speech is clear. Gait appears within normal limits, with some assistance from PT.  Data Reviewed: Basic Metabolic Panel:  Recent Labs Lab 07/26/14 0609 07/27/14 0611 07/28/14 0655 07/28/14 1433 07/29/14 0604  NA 125* 128* 129* 127* 128*  K 4.0 4.0 3.9 4.0 3.9  CL 94* 99* 99* 97* 95*  CO2 25 24 24 24 25   GLUCOSE 95 109* 109* 181* 113*  BUN 16 12 10 9 8   CREATININE 1.71* 1.64* 1.29* 1.38* 1.39*  CALCIUM 8.6* 8.3* 8.4* 8.3* 8.5*   Liver Function Tests:  Recent Labs Lab 07/25/14 1520  AST 20  ALT 11*   ALKPHOS 56  BILITOT 0.6  PROT 6.6  ALBUMIN 3.5   No results for input(s): LIPASE, AMYLASE in the last 168 hours. No results for input(s): AMMONIA in the last 168 hours. CBC:  Recent Labs Lab 07/25/14 1520 07/26/14 0609 07/27/14 0611 07/28/14 0655 07/29/14 0604  WBC 7.7 5.7 4.9 5.5 5.1  NEUTROABS 5.4  --   --   --   --   HGB 11.5* 10.5* 10.0* 10.5* 10.8*  HCT 32.6* 30.2* 28.9* 30.4* 30.4*  MCV 90.1 91.0 91.5 90.7 89.9  PLT 251 205 198 197 228   Cardiac Enzymes: No results for input(s): CKTOTAL, CKMB, CKMBINDEX, TROPONINI in the last 168 hours. BNP (last 3 results)  Recent Labs  07/26/14 0557  BNP 155.0*    ProBNP (last 3 results)  Recent Labs  12/15/13 1245  PROBNP 2030.0*    CBG:  Recent Labs Lab 07/28/14 1147 07/28/14 1633 07/28/14 2052 07/29/14 0722 07/29/14 1150  GLUCAP 133* 136* 134* 120* 146*    Recent Results (from the past 240 hour(s))  Culture, Urine     Status: None (Preliminary result)   Collection Time: 07/26/14  6:00 PM  Result Value Ref Range Status   Specimen Description URINE, CLEAN CATCH  Final   Special Requests NONE  Final   Culture   Final    >=100,000 COLONIES/mL GRAM NEGATIVE RODS Performed at [facility]    Report Status PENDING  Incomplete     Studies: Dg Chest 2  View  07/27/2014   CLINICAL DATA:  Generalized weakness and chest congestion over the past several days. Current history of diabetes, hypertension, coronary artery disease with MI and cardiomyopathy.  EXAM: CHEST  2 VIEW  COMPARISON:  07/25/2014 and earlier.  FINDINGS: Cardiac silhouette mildly enlarged, unchanged. Thoracic aorta atherosclerotic, unchanged. Hilar and mediastinal contours otherwise unremarkable. Minimal pulmonary venous hypertension without overt edema. Minimal linear atelectasis in the lingula. Lungs otherwise clear. No localized airspace consolidation. No pleural effusions. No pneumothorax. Degenerative changes involving the thoracic spine.  Note made of a compression fracture of the upper endplate of what I believe is L2.  IMPRESSION: Stable mild cardiomegaly. Minimal linear atelectasis in the lingula. No acute cardiopulmonary disease otherwise.   Electronically Signed   By: Evangeline Dakin M.D.   On: 07/27/2014 13:51    Scheduled Meds: . amLODipine  5 mg Oral Daily  . aspirin  81 mg Oral BID  . cefTRIAXone (ROCEPHIN)  IV  1 g Intravenous Q24H  . citalopram  5 mg Oral QHS  . enoxaparin (LOVENOX) injection  40 mg Subcutaneous Q24H  . insulin aspart  0-5 Units Subcutaneous QHS  . insulin aspart  0-9 Units Subcutaneous TID WC  . levothyroxine  112 mcg Oral QAC breakfast  . metoprolol tartrate  37.5 mg Oral BID   Continuous Infusions: . sodium chloride 50 mL/hr at 07/29/14 0415    Assessment and plan:  Principal Problem:   Hyponatremia Active Problems:   Vertigo   Cardiomyopathy, ischemic   AKI (acute kidney injury)   UTI (lower urinary tract infection)   CKD (chronic kidney disease), stage III   Essential hypertension   Type 2 diabetes with nephropathy  Brief history: Patient is an 81 year old woman with a history of ST elevation MI in 2013, borderline diabetes, hypertension, chronic kidney disease and chronic vertigo. She presented on 07/25/14 with a chief complaint of dizziness. She was recently treated for UTI with Cipro, but the patient could not tolerate Cipro. In the ED, her urinalysis was positive for infection. She was started on Rocephin. Her urine sodium was low at 119. Her BNP and TSH were within normal limits. She was started on normal saline with improvement in her serum sodium to 129, but it decreased to 127. She was given Lasix empirically with no significant improvement. Therefore, nephrology was consulted. He placed the patient on fluid restrictions. Patient was started on Celexa approximately one month ago-will consider holding it for a couple of days. Patient appears to be asymptomatic. Patient's blood  pressure has trended up. Dose of metoprolol was increased. Norvasc was added by nephrology. Her urine culture is growing gram-negative rods. Patient was unaware that she had borderline diabetes. Hemoglobin A1c was 6.4. Treated with SSRI, but would treat with diet alone following discharge given her age and debilitation. Her renal function has improved progressively. Patient will likely need a couple more days in the hospital.     1. Hyponatremia. The patient's serum sodium was 119 on admission. She was started on IV fluids with normal saline. Her serum sodium has improved overall, but it decreased to 127. She was given 1 dose of IV Lasix and her serum sodium improved to just 128 today. - Sodium osmolality was slightly low. -BNP on admission was only 155 -Her TSH was within normal limits. Her uric acid was within normal limits. -She was started on Celexa 1 month ago. It can cause hyponatremia. Will hold Celexa for couple of days to see if it helps.  Her diet was also changed to liberalize the sodium a little. -Nephrology consulted and placed the patient on fluid restriction to decrease her free water. -We'll continue to follow.  UTI. The patient was apparently treated with Cipro recently for UTI. Per history, she did not tolerate it. Another urinalysis in the ED revealed infection. She was started on Rocephin. Urine culture is growing gram-negative rods; identification pending.  CAD with ischemic cardiomyopathy. Patient was continued on metoprolol, aspirin, and sublingual nitroglycerin when necessary. Chest x-ray on 7/24 revealed no pulmonary edema. She denies chest pain.  Essential hypertension. The patient is treated with metoprolol chronically.  Her blood pressure is trending up, which may be secondary to normal saline infusion. Normal saline was decreased. She was given 1 dose of IV Lasix. -Metoprolol increased to 37.5 twice a day. Norvasc added by nephrology.  Hyperglycemia-type 2  diabetes mellitus. The patient's venous glucose was 168 on admission. Her past medical history mentions diabetes, but the patient was unaware of that and she is not on any medications for treatment.  We'll continue sliding scale NovoLog started. Hemoglobin A1c was 6.4.  Her diet was changed carbohydrate modified. Would favor treating her with diet alone for now.  Mild acute kidney injury in the setting of probable stage III to stage IV chronic kidney disease. The patient's creatinine was 1.83 on admission. Per chart review, her creatinine was 2.40, three months ago and it was 1.45 seven months ago. -With IV fluid hydration, her renal function has improved progressively..  Pulmonary crackles/chest congestion. Patient noted to have a few more crackles on exam 7/24. -Follow-up chest x-ray was ordered and revealed no acute cardiopulmonary disease and minimal linear atelectasis in the lingula. -Xopenex was ordered as needed. -Her lungs are clear today.  Vertigo. Patient has a history of vertigo, but her presenting dizziness was likely from hyponatremia. When necessary meclizine was ordered. She denies vertigo with improvement in her serum sodium.   Time spent: 30 minutes    [facility] Pager [phone]. If 7PM-7AM, please contact night-coverage at [email or web address], password [facility] 07/29/2014, 12:54 PM  LOS: 4 days

## 2014-07-30 DIAGNOSIS — N179 Acute kidney failure, unspecified: Secondary | ICD-10-CM

## 2014-07-30 DIAGNOSIS — N183 Chronic kidney disease, stage 3 (moderate): Secondary | ICD-10-CM

## 2014-07-30 DIAGNOSIS — I10 Essential (primary) hypertension: Secondary | ICD-10-CM

## 2014-07-30 DIAGNOSIS — E871 Hypo-osmolality and hyponatremia: Secondary | ICD-10-CM

## 2014-07-30 LAB — GLUCOSE, CAPILLARY
GLUCOSE-CAPILLARY: 125 mg/dL — AB (ref 65–99)
Glucose-Capillary: 125 mg/dL — ABNORMAL HIGH (ref 65–99)
Glucose-Capillary: 183 mg/dL — ABNORMAL HIGH (ref 65–99)
Glucose-Capillary: 141 mg/dL — ABNORMAL HIGH (ref 65–99)

## 2014-07-30 LAB — BASIC METABOLIC PANEL
Anion gap: 6 (ref 5–15)
BUN: 8 mg/dL (ref 6–20)
CHLORIDE: 97 mmol/L — AB (ref 101–111)
CO2: 24 mmol/L (ref 22–32)
CREATININE: 1.19 mg/dL — AB (ref 0.44–1.00)
Calcium: 8.3 mg/dL — ABNORMAL LOW (ref 8.9–10.3)
GFR calc Af Amer: 49 mL/min — ABNORMAL LOW (ref >60)
GFR calc non Af Amer: 42 mL/min — ABNORMAL LOW (ref >60)
GLUCOSE: 125 mg/dL — AB (ref 65–99)
Potassium: 3.7 mmol/L (ref 3.5–5.1)
Sodium: 127 mmol/L — ABNORMAL LOW (ref 135–145)

## 2014-07-30 MED ORDER — AMLODIPINE BESYLATE 5 MG PO TABS
10.0000 mg | ORAL_TABLET | Freq: Every day | ORAL | Status: DC
  Administered 2014-07-31 – 2014-08-02 (×3): 10 mg via ORAL
  Filled 2014-07-30 (×3): qty 2

## 2014-07-30 MED ORDER — FUROSEMIDE 10 MG/ML IJ SOLN
20.0000 mg | Freq: Once | INTRAMUSCULAR | Status: AC
  Administered 2014-07-30: 20 mg via INTRAVENOUS
  Filled 2014-07-30: qty 2

## 2014-07-30 NOTE — Progress Notes (Signed)
[facility] PROGRESS NOTE  MARQUITTA PERSICHETTI FGH:[id number] DOB: 1932-02-05 DOA: 07/25/2014 PCP: Monico Blitz, MD  Assessment/Plan: 1. Hyponatremia. Likely mulit- factorial related to volume depletion and SIADH. Nephrology is following. She has been adequately hydrated.TSH normal. She will receive 1 dose of Lasix today and she has been started on salt tablets. Will discontinue celexa since this may contribute to hyponatremia. Recheck in am. 2. Possible UTI. UA on admission indicated possible infection and Urine culture is positive for Klebsiella Pneumoniae. Which is an ESBL. Since she is afebrile with no leukocytosis and has no urinary symptoms, this is likely a colonisation rather than a true infection. Will discontinue antibiotics.  3. Acute Renal Failure. Superimposed on CKD Stage III. Related to dehydration and improved on IVF. Creatinine is now back to baseline. 4. Hypertension. Blood pressure is running high. She is on Metropol and Norvasc which was added by Nephrology. Will continue to monitor.  5. DM. Type 2. A1C 6.4 Continue sliding scale insulin. Would favor treating with dietary modifications on discharge.  6. Vertigo. Pt has history of vertigo and presents with worsen dizziness likely exacerbated by dehydration and hyponatremia. Symptoms have improved since admission with hydration.   Code Status: Full DVT prophylaxis  Lovenox Family Communication: Spoke with patient. She understands care plans and has no there concerns at this time.  Disposition Plan: Discharge home once symptoms improve.   Consultants:  Nephrology  Procedures:    Antibiotics:  Rocephin 07/26/14>>7/27  HPI/Subjective: Pt reports feeling better. Dizziness has resolved and she denies ever having issues with her sodium levels. No reports of chest pain, shortness of breath, n/v/d, or abd pain.  Objective: Filed Vitals:   07/30/14 0517  BP: 185/66  Pulse: 65  Temp: 98.2 F (36.8 C)  Resp: 18     Intake/Output Summary (Last 24 hours) at 07/30/14 0918 Last data filed at 07/30/14 0900  Gross per 24 hour  Intake   1870 ml  Output      0 ml  Net   1870 ml   Filed Weights   07/25/14 1508 07/25/14 1927  Weight: 71.215 kg (157 lb) 76.567 kg (168 lb 12.8 oz)    Exam:  General: NAD Cardiovascular: S1, S2 irregular Respiratory: clear bilaterally Abdomen: soft, non tender, no distention , bowel sounds normal Musculoskeletal: No edema b/l   Data Reviewed: Basic Metabolic Panel:  Recent Labs Lab 07/27/14 0611 07/28/14 0655 07/28/14 1433 07/29/14 0604 07/30/14 0546  NA 128* 129* 127* 128* 127*  K 4.0 3.9 4.0 3.9 3.7  CL 99* 99* 97* 95* 97*  CO2 24 24 24 25 24   GLUCOSE 109* 109* 181* 113* 125*  BUN 12 10 9 8 8   CREATININE 1.64* 1.29* 1.38* 1.39* 1.19*  CALCIUM 8.3* 8.4* 8.3* 8.5* 8.3*   Liver Function Tests:  Recent Labs Lab 07/25/14 1520  AST 20  ALT 11*  ALKPHOS 56  BILITOT 0.6  PROT 6.6  ALBUMIN 3.5   No results for input(s): LIPASE, AMYLASE in the last 168 hours. No results for input(s): AMMONIA in the last 168 hours. CBC:  Recent Labs Lab 07/25/14 1520 07/26/14 0609 07/27/14 0611 07/28/14 0655 07/29/14 0604  WBC 7.7 5.7 4.9 5.5 5.1  NEUTROABS 5.4  --   --   --   --   HGB 11.5* 10.5* 10.0* 10.5* 10.8*  HCT 32.6* 30.2* 28.9* 30.4* 30.4*  MCV 90.1 91.0 91.5 90.7 89.9  PLT 251 205 198 197 228   Cardiac Enzymes: No results for input(s):  CKTOTAL, CKMB, CKMBINDEX, TROPONINI in the last 168 hours. BNP (last 3 results)  Recent Labs  07/26/14 0557  BNP 155.0*    ProBNP (last 3 results)  Recent Labs  12/15/13 1245  PROBNP 2030.0*    CBG:  Recent Labs Lab 07/29/14 0722 07/29/14 1150 07/29/14 1633 07/29/14 2212 07/30/14 0728  GLUCAP 120* 146* 116* 214* 125*    Recent Results (from the past 240 hour(s))  Culture, Urine     Status: None (Preliminary result)   Collection Time: 07/26/14  6:00 PM  Result Value Ref Range Status    Specimen Description URINE, CLEAN CATCH  Final   Special Requests NONE  Final   Culture   Final    >=100,000 COLONIES/mL KLEBSIELLA PNEUMONIAE Confirmed Extended Spectrum Beta-Lactamase Producer (ESBL) ISOLATE BEING SENT OUT FOR CONFIRMATION RESULT CALLED TO, READ BACK BY AND VERIFIED WITHMarge Duncans AT 5625 07/30/14 Performed at [facility]    Report Status PENDING  Incomplete   Organism ID, Bacteria KLEBSIELLA PNEUMONIAE  Final      Susceptibility   Klebsiella pneumoniae - MIC*    AMPICILLIN >=32 RESISTANT Resistant     CEFAZOLIN >=64 RESISTANT Resistant     CEFTRIAXONE >=64 RESISTANT Resistant     CIPROFLOXACIN >=4 RESISTANT Resistant     GENTAMICIN >=16 RESISTANT Resistant     IMIPENEM <=0.25 RESISTANT Resistant     NITROFURANTOIN 128 RESISTANT Resistant     TRIMETH/SULFA 40 SENSITIVE Sensitive     AMPICILLIN/SULBACTAM >=32 RESISTANT Resistant     PIP/TAZO >=128 RESISTANT Resistant     * >=100,000 COLONIES/mL KLEBSIELLA PNEUMONIAE     Studies: No results found.  Scheduled Meds: . [START ON 07/31/2014] amLODipine  10 mg Oral Daily  . aspirin  81 mg Oral BID  . cefTRIAXone (ROCEPHIN)  IV  1 g Intravenous Q24H  . citalopram  5 mg Oral QHS  . enoxaparin (LOVENOX) injection  40 mg Subcutaneous Q24H  . furosemide  20 mg Intravenous Once  . insulin aspart  0-5 Units Subcutaneous QHS  . insulin aspart  0-9 Units Subcutaneous TID WC  . levothyroxine  112 mcg Oral QAC breakfast  . metoprolol tartrate  37.5 mg Oral BID   Continuous Infusions: . sodium chloride 50 mL/hr at 07/29/14 2356    Principal Problem:   Hyponatremia Active Problems:   Cardiomyopathy, ischemic   AKI (acute kidney injury)   UTI (lower urinary tract infection)   CKD (chronic kidney disease), stage III   Vertigo   Type 2 diabetes with nephropathy   Essential hypertension    Time spent: 71 Minutes    Kathie Dike, MD [facility] Pager [phone]. If 7PM-7AM, please contact  night-coverage at [email or web address], password [facility] 07/30/2014, 9:18 AM  LOS: 5 days    I, Jessica D. Leonie Green, am scribing for Dr. Kathie Dike, M.D. on 07/30/2014 at 9:22 AM   Attending:  I have reviewed the above documentation for accuracy and completeness, and I agree with the above.  MEMON,JEHANZEB

## 2014-07-30 NOTE — Progress Notes (Signed)
Physical Therapy Treatment Patient Details Name: Joann Hunter MRN: [id number] DOB: 09-29-1932 Today's Date: 07/30/2014    History of Present Illness With a history of coronary artery disease with an STEMI in 2013, hyperglycemia, hypertension, hyperlipidemia, chronic vertigo. Patient has been treated with ciprofloxacin over the past week due to a urinary tract infection. The patient has had worsening vertigo during this time to the point where he is unable to stand or walk due to worsening of her symptoms. Sitting and resting improve her vertigo. She has attempted to use meclizine, which has not improved her symptoms. No other peeling or provoking factors. Her symptoms are worsening.    PT Comments    Pt only complaint during treatment is Lt leg pain when walking.  Pt states she has a walker at home but does not use it.  Pt does not appear to be interested in exercises that may decrease her leg pain when question about this but therapist did recommend that pt may be able to ambulate with less pain using her walker at home.   Follow Up Recommendations  No PT follow up     Equipment Recommendations  None recommended by PT (Pt has walker at home )    Recommendations for Other Services  none     Precautions / Restrictions Precautions Precautions: None    Mobility  Bed Mobility Overal bed mobility: Independent                Transfers   Equipment used: None                Ambulation/Gait Ambulation/Gait assistance: Independent Ambulation Distance (Feet): 120 Feet Assistive device: None (no AD for first 53ft then walker due to leg pain) Gait Pattern/deviations: WFL(Within Functional Limits)   Gait velocity interpretation: at or above normal speed for age/gender            Cognition Arousal/Alertness: Awake/alert Behavior During Therapy: WFL for tasks assessed/performed Overall Cognitive Status: Within Functional Limits for tasks assessed                      Exercises General Exercises - Lower Extremity Ankle Circles/Pumps: AROM;Both;10 reps Quad Sets: AROM;Both;10 reps Gluteal Sets: AROM;Both;10 reps Heel Slides: AROM;Both;10 reps Straight Leg Raises: AROM;Both;10 reps Mini-Sqauts: Both;10 reps (bridging )        Pertinent Vitals/Pain Pain Assessment: 0-10 Pain Score: 4  Pain Location: Lt radicular LE sx  Pain Descriptors / Indicators: Aching Pain Intervention(s): Limited activity within patient's tolerance    Home Living  Lives with husband, daughter and great-grandchild                     Prior Function     I       PT Goals (current goals can now be found in the care plan section) Acute Rehab PT Goals Patient Stated Goal: none stated Progress towards PT goals: Progressing toward goals    Frequency  Min 3X/week    PT Plan Current plan remains appropriate       End of Session Equipment Utilized During Treatment: Gait belt Activity Tolerance: Patient limited by pain Patient left: in chair;with call bell/phone within reach     Time: 1138-1203 PT Time Calculation (min) (ACUTE ONLY): 25 min  Charges:  $Gait Training: 8-22 mins $Therapeutic Exercise: 8-22 mins                    G Codes:  Rayetta Humphrey, PT CLT [phone] 07/30/2014, 12:04 PM

## 2014-07-30 NOTE — Progress Notes (Signed)
Joann Hunter  MRN: [id number]  DOB/AGE: 07/05/1932 81 y.o.  Primary Care Physician:SHAH,ASHISH, MD  Admit date: 07/25/2014  Chief Complaint:  Chief Complaint  Patient presents with  . Emesis    S-Pt presented on  07/25/2014 with  Chief Complaint  Patient presents with  . Emesis  .    Pt today feels better  Meds . amLODipine  5 mg Oral Daily  . aspirin  81 mg Oral BID  . cefTRIAXone (ROCEPHIN)  IV  1 g Intravenous Q24H  . citalopram  5 mg Oral QHS  . enoxaparin (LOVENOX) injection  40 mg Subcutaneous Q24H  . insulin aspart  0-5 Units Subcutaneous QHS  . insulin aspart  0-9 Units Subcutaneous TID WC  . levothyroxine  112 mcg Oral QAC breakfast  . metoprolol tartrate  37.5 mg Oral BID      Physical Exam: Vital signs in last 24 hours: Temp:  [98.2 F (36.8 C)-98.9 F (37.2 C)] 98.2 F (36.8 C) (07/27 0517) Pulse Rate:  [51-76] 65 (07/27 0517) Resp:  [18] 18 (07/27 0517) BP: (185-189)/(63-66) 185/66 mmHg (07/27 0517) SpO2:  [96 %-99 %] 99 % (07/27 0517) Weight change:  Last BM Date: 07/27/14  Intake/Output from previous day: 07/26 0701 - 07/27 0700 In: 1990 [P.O.:720; I.V.:1220; IV Piggyback:50] Out: -      Physical Exam: General- pt is awake,alert, oriented to time place and person Resp- No acute REsp distress, CTA B/L NO Rhonchi CVS- S1S2 regular in rate and rhythm GIT- BS+, soft, NT, ND EXT- NO LE Edema, Cyanosis   Lab Results: CBC  Recent Labs  07/28/14 0655 07/29/14 0604  WBC 5.5 5.1  HGB 10.5* 10.8*  HCT 30.4* 30.4*  PLT 197 228    BMET  Recent Labs  07/29/14 0604 07/30/14 0546  NA 128* 127*  K 3.9 3.7  CL 95* 97*  CO2 25 24  GLUCOSE 113* 125*  BUN 8 8  CREATININE 1.39* 1.19*  CALCIUM 8.5* 8.3*    Sodium 2016 119=>127--128 2015  135 2013 130--135  MICRO Recent Results (from the past 240 hour(s))  Culture, Urine     Status: None (Preliminary result)   Collection Time: 07/26/14  6:00 PM  Result Value Ref Range  Status   Specimen Description URINE, CLEAN CATCH  Final   Special Requests NONE  Final   Culture   Final    >=100,000 COLONIES/mL GRAM NEGATIVE RODS Performed at [facility]    Report Status PENDING  Incomplete      Lab Results  Component Value Date   CALCIUM 8.3* 07/30/2014   CAION 1.19 04/25/2011               Impression: 1)Renal  CKD stage 3 .                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  5)Hyponatremia     Chronic    Sodium improving      Multifactorial  SIADH           SSRI- now held           Hypovolemia- improved in IVF early omn during admission  6)ID admitted with UTI  7)Acid base Co2 at goal     Plan:  Will increase amlodipine to 10 Will decrease IVF Will give a dose of lasix Will start Salt tabs ( after BP is better)     Lener Ventresca S 07/30/2014, 8:30 AM

## 2014-07-31 LAB — GLUCOSE, CAPILLARY
Glucose-Capillary: 122 mg/dL — ABNORMAL HIGH (ref 65–99)
Glucose-Capillary: 140 mg/dL — ABNORMAL HIGH (ref 65–99)
Glucose-Capillary: 120 mg/dL — ABNORMAL HIGH (ref 65–99)
Glucose-Capillary: 151 mg/dL — ABNORMAL HIGH (ref 65–99)

## 2014-07-31 LAB — BASIC METABOLIC PANEL
Anion gap: 6 (ref 5–15)
BUN: 10 mg/dL (ref 6–20)
CHLORIDE: 95 mmol/L — AB (ref 101–111)
CO2: 27 mmol/L (ref 22–32)
CREATININE: 1.26 mg/dL — AB (ref 0.44–1.00)
Calcium: 8.6 mg/dL — ABNORMAL LOW (ref 8.9–10.3)
GFR, EST AFRICAN AMERICAN: 45 mL/min — AB (ref >60)
GFR, EST NON AFRICAN AMERICAN: 39 mL/min — AB (ref >60)
GLUCOSE: 118 mg/dL — AB (ref 65–99)
POTASSIUM: 3.8 mmol/L (ref 3.5–5.1)
Sodium: 128 mmol/L — ABNORMAL LOW (ref 135–145)

## 2014-07-31 MED ORDER — SODIUM CHLORIDE 1 G PO TABS
1.0000 g | ORAL_TABLET | Freq: Two times a day (BID) | ORAL | Status: DC
  Administered 2014-07-31: 1 g via ORAL
  Filled 2014-07-31 (×2): qty 1

## 2014-07-31 NOTE — Progress Notes (Signed)
[facility] PROGRESS NOTE  Joann Hunter PFX:[id number] DOB: 12/20/1932 DOA: 07/25/2014 PCP: Monico Blitz, MD  Assessment/Plan: 1. Hyponatremia. Likely mulit- factorial related to volume depletion and SIADH. Discussed with nephrology and recommendations were to start patient on salt tabs today and continue with fluid restriction. She has been adequately hydrated.TSH normal. She has received 1 dose of Lasix 07/30/14. Celexa has been discontinued since this may contribute to hyponatremia. Will repeat bmet in the morning and if stable, can consider discharge home. 2. Possible UTI. UA on admission indicated possible infection and Urine culture is positive for Klebsiella Pneumoniae. Which is an ESBL. Since she is afebrile with no leukocytosis and has no urinary symptoms, this is likely a colonisation rather than a true infection. Will discontinue antibiotics.  3. Acute Renal Failure. Superimposed on CKD Stage III. Related to dehydration and improved on IVF. Creatinine is now back to baseline. 4. Hypertension. Blood pressure is running high. She is on Metropolol and Norvasc which was added by Nephrology. Will continue to monitor.  5. DM. Type 2. A1C 6.4 Continue sliding scale insulin. Would favor treating with dietary modifications on discharge.  6. Vertigo. Pt has history of vertigo and presents with worsen dizziness likely exacerbated by dehydration and hyponatremia. Symptoms have improved since admission with hydration.   Code Status: Full DVT prophylaxis  Lovenox Family Communication: Spoke with patient. She understands care plans and has no there concerns at this time 07/31/14. Disposition Plan: Discharge home once symptoms improved.   Consultants:  Nephrology  Procedures:    Antibiotics:  Rocephin 07/26/14>>7/27  HPI/Subjective: Patient reports feeling better and wanting to go home. No reports of chest pain, shortness of breath, n/v/d, or abd pain.  Objective: Filed Vitals:    07/31/14 0642  BP: 158/56  Pulse: 61  Temp: 98.3 F (36.8 C)  Resp: 18    Intake/Output Summary (Last 24 hours) at 07/31/14 1118 Last data filed at 07/30/14 1822  Gross per 24 hour  Intake    440 ml  Output      0 ml  Net    440 ml   Filed Weights   07/25/14 1508 07/25/14 1927  Weight: 71.215 kg (157 lb) 76.567 kg (168 lb 12.8 oz)    Exam:  General: NAD, afebrile, appears comfortable. Cardiovascular: S1, S2 irregular Respiratory: clear bilaterally Abdomen: soft, non tender, no distention , bowel sounds normal Musculoskeletal: No edema b/l   Data Reviewed: Basic Metabolic Panel:  Recent Labs Lab 07/28/14 0655 07/28/14 1433 07/29/14 0604 07/30/14 0546 07/31/14 0542  NA 129* 127* 128* 127* 128*  K 3.9 4.0 3.9 3.7 3.8  CL 99* 97* 95* 97* 95*  CO2 24 24 25 24 27   GLUCOSE 109* 181* 113* 125* 118*  BUN 10 9 8 8 10   CREATININE 1.29* 1.38* 1.39* 1.19* 1.26*  CALCIUM 8.4* 8.3* 8.5* 8.3* 8.6*   Liver Function Tests:  Recent Labs Lab 07/25/14 1520  AST 20  ALT 11*  ALKPHOS 56  BILITOT 0.6  PROT 6.6  ALBUMIN 3.5   No results for input(s): LIPASE, AMYLASE in the last 168 hours. No results for input(s): AMMONIA in the last 168 hours. CBC:  Recent Labs Lab 07/25/14 1520 07/26/14 0609 07/27/14 0611 07/28/14 0655 07/29/14 0604  WBC 7.7 5.7 4.9 5.5 5.1  NEUTROABS 5.4  --   --   --   --   HGB 11.5* 10.5* 10.0* 10.5* 10.8*  HCT 32.6* 30.2* 28.9* 30.4* 30.4*  MCV 90.1 91.0 91.5 90.7 89.9  PLT 251 205 198 197 228   Cardiac Enzymes: No results for input(s): CKTOTAL, CKMB, CKMBINDEX, TROPONINI in the last 168 hours. BNP (last 3 results)  Recent Labs  07/26/14 0557  BNP 155.0*    ProBNP (last 3 results)  Recent Labs  12/15/13 1245  PROBNP 2030.0*    CBG:  Recent Labs Lab 07/30/14 0728 07/30/14 1117 07/30/14 1614 07/30/14 2109 07/31/14 0739  GLUCAP 125* 125* 183* 141* 122*    Recent Results (from the past 240 hour(s))  Culture, Urine      Status: None (Preliminary result)   Collection Time: 07/26/14  6:00 PM  Result Value Ref Range Status   Specimen Description URINE, CLEAN CATCH  Final   Special Requests NONE  Final   Culture   Final    >=100,000 COLONIES/mL KLEBSIELLA PNEUMONIAE Confirmed Extended Spectrum Beta-Lactamase Producer (ESBL) ISOLATE BEING SENT OUT FOR CONFIRMATION RESULT CALLED TO, READ BACK BY AND VERIFIED WITHMarge Duncans AT 6811 07/30/14 Performed at [facility]    Report Status PENDING  Incomplete   Organism ID, Bacteria KLEBSIELLA PNEUMONIAE  Final      Susceptibility   Klebsiella pneumoniae - MIC*    AMPICILLIN >=32 RESISTANT Resistant     CEFAZOLIN >=64 RESISTANT Resistant     CEFTRIAXONE >=64 RESISTANT Resistant     CIPROFLOXACIN >=4 RESISTANT Resistant     GENTAMICIN >=16 RESISTANT Resistant     IMIPENEM <=0.25 RESISTANT Resistant     NITROFURANTOIN 128 RESISTANT Resistant     TRIMETH/SULFA 40 SENSITIVE Sensitive     AMPICILLIN/SULBACTAM >=32 RESISTANT Resistant     PIP/TAZO >=128 RESISTANT Resistant     * >=100,000 COLONIES/mL KLEBSIELLA PNEUMONIAE     Studies: No results found.  Scheduled Meds: . amLODipine  10 mg Oral Daily  . aspirin  81 mg Oral BID  . enoxaparin (LOVENOX) injection  40 mg Subcutaneous Q24H  . insulin aspart  0-5 Units Subcutaneous QHS  . insulin aspart  0-9 Units Subcutaneous TID WC  . levothyroxine  112 mcg Oral QAC breakfast  . metoprolol tartrate  37.5 mg Oral BID   Continuous Infusions: . sodium chloride 10 mL/hr at 07/30/14 5726    Principal Problem:   Hyponatremia Active Problems:   Cardiomyopathy, ischemic   AKI (acute kidney injury)   UTI (lower urinary tract infection)   CKD (chronic kidney disease), stage III   Vertigo   Type 2 diabetes with nephropathy   Essential hypertension    Time spent: 25Minutes    Kathie Dike, MD [facility] Pager [phone]. If 7PM-7AM, please contact night-coverage at [email or web address],  password [facility] 07/31/2014, 11:18 AM  LOS: 6 days    I, Jessica D. Leonie Green, am scribing for Dr. Kathie Dike, M.D. on 07/31/2014 at 11:18 AM   Attending:  I have reviewed the above documentation for accuracy and completeness, and I agree with the above.  Chevella Pearce

## 2014-07-31 NOTE — Progress Notes (Signed)
Subjective: Patient presently denies any weakness, dizziness or lightheadedness. Overall she feels good and she wanted to go home.   Objective: Vital signs in last 24 hours: Temp:  [97.5 F (36.4 C)-98.4 F (36.9 C)] 98.3 F (36.8 C) (07/28 0642) Pulse Rate:  [61-64] 61 (07/28 0642) Resp:  [18] 18 (07/28 0642) BP: (141-158)/(44-56) 158/56 mmHg (07/28 0642) SpO2:  [96 %-98 %] 98 % (07/28 0642)  Intake/Output from previous day: 07/27 0701 - 07/28 0700 In: 560 [P.O.:560] Out: -  Intake/Output this shift:     Recent Labs  07/29/14 0604  HGB 10.8*    Recent Labs  07/29/14 0604  WBC 5.1  RBC 3.38*  HCT 30.4*  PLT 228    Recent Labs  07/30/14 0546 07/31/14 0542  NA 127* 128*  K 3.7 3.8  CL 97* 95*  CO2 24 27  BUN 8 10  CREATININE 1.19* 1.26*  GLUCOSE 125* 118*  CALCIUM 8.3* 8.6*   No results for input(s): LABPT, INR in the last 72 hours.  Generally patient is alert and in no apparent distress. Presently she is sitting on a chair. Chest is clear to auscultation Heart exam reveals regular rate and rhythm no murmur Extremities no edema  Assessment/Plan: Problem #1 hyponatremia: Most likely SIADH. Presently patient is on free water restriction and also Celexa was discontinued. Her sodium is low but improving. Patient denies any weakness. Overall she feels good. Problem #2 renal failure: Creatinine slightly high but overall controlled. Possibly this is her baseline Problem #3 cardiomyopathy Problem #4 history of diabetes Problem # 5 hypertension: Her blood pressure is reasonably controlled Problem #6 anemia: Her hemoglobin is stable Problem #7 UTI: Patient is a febrile. Plan: We'll continue his present management If her sodium doesn't improve probably will consider demeclocycline. However because of his side effect will try conservative measures. We'll check her basic metabolic panel in the morning.   Berthold Glace S 07/31/2014, 10:17 AM

## 2014-07-31 NOTE — Care Management Note (Signed)
Case Management Note  Patient Details  Name: Joann Hunter MRN: [id number] Date of Birth: 14-Jan-1932  Subjective/Objective:                    Action/Plan:   Expected Discharge Date:                  Expected Discharge Plan:  Home/Self Care  In-House Referral:  NA  Discharge planning Services  CM Consult  Post Acute Care Choice:  NA Choice offered to:  NA  DME Arranged:    DME Agency:     HH Arranged:    [place] Agency:     Status of Service:  Completed, signed off  Medicare Important Message Given:  Yes-second notification given Date Medicare IM Given:    Medicare IM give by:    Date Additional Medicare IM Given:    Additional Medicare Important Message give by:     If discussed at [place] of Stay Meetings, dates discussed:  07/31/14  Additional Comments:  Joylene Draft, RN 07/31/2014, 3:12 PM

## 2014-08-01 LAB — GLUCOSE, CAPILLARY
GLUCOSE-CAPILLARY: 117 mg/dL — AB (ref 65–99)
GLUCOSE-CAPILLARY: 194 mg/dL — AB (ref 65–99)
GLUCOSE-CAPILLARY: 137 mg/dL — AB (ref 65–99)
Glucose-Capillary: 115 mg/dL — ABNORMAL HIGH (ref 65–99)

## 2014-08-01 LAB — URINE CULTURE

## 2014-08-01 LAB — BASIC METABOLIC PANEL
ANION GAP: 9 (ref 5–15)
BUN: 10 mg/dL (ref 6–20)
CHLORIDE: 92 mmol/L — AB (ref 101–111)
CO2: 25 mmol/L (ref 22–32)
Calcium: 8.5 mg/dL — ABNORMAL LOW (ref 8.9–10.3)
Creatinine, Ser: 1.29 mg/dL — ABNORMAL HIGH (ref 0.44–1.00)
GFR calc non Af Amer: 38 mL/min — ABNORMAL LOW (ref >60)
GFR, EST AFRICAN AMERICAN: 44 mL/min — AB (ref >60)
Glucose, Bld: 123 mg/dL — ABNORMAL HIGH (ref 65–99)
POTASSIUM: 3.9 mmol/L (ref 3.5–5.1)
Sodium: 126 mmol/L — ABNORMAL LOW (ref 135–145)

## 2014-08-01 MED ORDER — DEMECLOCYCLINE HCL 150 MG PO TABS
150.0000 mg | ORAL_TABLET | Freq: Two times a day (BID) | ORAL | Status: DC
  Administered 2014-08-01 – 2014-08-02 (×3): 150 mg via ORAL
  Filled 2014-08-01 (×7): qty 1

## 2014-08-01 MED ORDER — DEMECLOCYCLINE HCL 150 MG PO TABS
150.0000 mg | ORAL_TABLET | Freq: Two times a day (BID) | ORAL | Status: DC
  Filled 2014-08-01 (×7): qty 1

## 2014-08-01 NOTE — Progress Notes (Signed)
PT Cancellation Note  Patient Details Name: Joann Hunter MRN: [id number] DOB: June 20, 1932   Cancelled Treatment:    Reason Eval/Treat Not Completed: Other (comment).  Pt up with CNA and ambulated to the nurses station and back, about 200'.  We should be able to d/c PT at this point.   Sable Feil 08/01/2014, 4:12 PM

## 2014-08-01 NOTE — Care Management Note (Signed)
Case Management Note  Patient Details  Name: Joann Hunter MRN: [id number] Date of Birth: [birth year]  Subjective/Objective:                    Action/Plan:   Expected Discharge Date:                  Expected Discharge Plan:  Home/Self Care  In-House Referral:  NA  Discharge planning Services  CM Consult  Post Acute Care Choice:  NA Choice offered to:  NA  DME Arranged:    DME Agency:     HH Arranged:    [place] Agency:     Status of Service:  Completed, signed off  Medicare Important Message Given:  Yes-second notification given Date Medicare IM Given:    Medicare IM give by:    Date Additional Medicare IM Given:    Additional Medicare Important Message give by:     If discussed at [place] of Stay Meetings, dates discussed:    Additional Comments: Pts sodium is still low, 126. Started on declomycin today. Anticipate discharge over the weekend. Christinia Gully [place], RN 08/01/2014, 11:27 AM

## 2014-08-01 NOTE — Progress Notes (Signed)
[facility] PROGRESS NOTE  Joann Hunter QJJ:[id number] DOB: [birth year] DOA: 07/25/2014 PCP: Monico Blitz, MD  Assessment/Plan: 1. Hyponatremia. Likely mulit- factorial related to volume depletion and SIADH. Seen by Nephrology today and he now and recommendations were to d/c the salt tablets, start demeclocycline 150 mg twice a day, and recheck BMP in the AM. Will continue with fluid restriction and monitor.TSH normal. Celexa was discontinued since this may contribute to hyponatremia.  2. Possible UTI. UA on admission indicated possible infection and Urine culture is positive for Klebsiella Pneumoniae. Which is an ESBL. Since she is afebrile with no leukocytosis and has no urinary symptoms, this is likely a colonisation rather than a true infection. Antibiotics were discontinued.  3. Acute Renal Failure. Superimposed on CKD Stage III. Related to dehydration and improved on IVF. Creatinine is now back to baseline. 4. Hypertension. Blood pressure stable. She is on Metropolol and Norvasc which was added by Nephrology. Will continue to monitor.  5. DM. Type 2. A1C 6.4 Continue sliding scale insulin. Would favor treating with dietary modifications on discharge.  6. Vertigo. Pt has history of vertigo and presented with worsening dizziness likely exacerbated by dehydration and hyponatremia. Symptoms have resolved since admission with hydration.   Code Status: Full DVT prophylaxis  Lovenox Family Communication: Spoke with patient. She understands care plans and has no there concerns at this time 08/01/14. Disposition Plan: Discharge home when improved  Consultants:  Nephrology  Procedures:    Antibiotics:  Rocephin 07/26/14>>7/27  HPI/Subjective: Patient ambulated in the hall today. She denies any shortness of breath, dizziness, chest pain   Objective: Filed Vitals:   08/01/14 0354  BP: 125/54  Pulse: 65  Temp: 98.4 F (36.9 C)  Resp: 18    Intake/Output Summary (Last 24  hours) at 08/01/14 1241 Last data filed at 08/01/14 0800  Gross per 24 hour  Intake 1292.83 ml  Output      0 ml  Net 1292.83 ml   Filed Weights   07/25/14 1508 07/25/14 1927  Weight: 71.215 kg (157 lb) 76.567 kg (168 lb 12.8 oz)    Exam:  General: NAD, afebrile, sitting up in bed Cardiovascular: S1, S2 irregular Respiratory: CTA B Abdomen: soft, nontender, bowel sounds+ Musculoskeletal: No edema b/l   Data Reviewed: Basic Metabolic Panel:  Recent Labs Lab 07/28/14 1433 07/29/14 0604 07/30/14 0546 07/31/14 0542 08/01/14 0702  NA 127* 128* 127* 128* 126*  K 4.0 3.9 3.7 3.8 3.9  CL 97* 95* 97* 95* 92*  CO2 24 25 24 27 25   GLUCOSE 181* 113* 125* 118* 123*  BUN 9 8 8 10 10   CREATININE 1.38* 1.39* 1.19* 1.26* 1.29*  CALCIUM 8.3* 8.5* 8.3* 8.6* 8.5*   Liver Function Tests:  Recent Labs Lab 07/25/14 1520  AST 20  ALT 11*  ALKPHOS 56  BILITOT 0.6  PROT 6.6  ALBUMIN 3.5   No results for input(s): LIPASE, AMYLASE in the last 168 hours. No results for input(s): AMMONIA in the last 168 hours. CBC:  Recent Labs Lab 07/25/14 1520 07/26/14 0609 07/27/14 0611 07/28/14 0655 07/29/14 0604  WBC 7.7 5.7 4.9 5.5 5.1  NEUTROABS 5.4  --   --   --   --   HGB 11.5* 10.5* 10.0* 10.5* 10.8*  HCT 32.6* 30.2* 28.9* 30.4* 30.4*  MCV 90.1 91.0 91.5 90.7 89.9  PLT 251 205 198 197 228   Cardiac Enzymes: No results for input(s): CKTOTAL, CKMB, CKMBINDEX, TROPONINI in the last 168 hours. BNP (last 3  results)  Recent Labs  07/26/14 0557  BNP 155.0*    ProBNP (last 3 results)  Recent Labs  12/15/13 1245  PROBNP 2030.0*    CBG:  Recent Labs Lab 07/31/14 1132 07/31/14 1622 07/31/14 2106 08/01/14 0729 08/01/14 1111  GLUCAP 140* 120* 151* 117* 194*    Recent Results (from the past 240 hour(s))  Culture, Urine     Status: None (Preliminary result)   Collection Time: 07/26/14  6:00 PM  Result Value Ref Range Status   Specimen Description URINE, CLEAN CATCH   Final   Special Requests NONE  Final   Culture   Final    >=100,000 COLONIES/mL KLEBSIELLA PNEUMONIAE Confirmed Extended Spectrum Beta-Lactamase Producer (ESBL) ISOLATE BEING SENT OUT FOR CONFIRMATION RESULT CALLED TO, READ BACK BY AND VERIFIED WITHMarge Duncans AT 1194 07/30/14 Performed at [facility]    Report Status PENDING  Incomplete   Organism ID, Bacteria KLEBSIELLA PNEUMONIAE  Final      Susceptibility   Klebsiella pneumoniae - MIC*    AMPICILLIN >=32 RESISTANT Resistant     CEFAZOLIN >=64 RESISTANT Resistant     CEFTRIAXONE >=64 RESISTANT Resistant     CIPROFLOXACIN >=4 RESISTANT Resistant     GENTAMICIN >=16 RESISTANT Resistant     IMIPENEM <=0.25 RESISTANT Resistant     NITROFURANTOIN 128 RESISTANT Resistant     TRIMETH/SULFA 40 SENSITIVE Sensitive     AMPICILLIN/SULBACTAM >=32 RESISTANT Resistant     PIP/TAZO >=128 RESISTANT Resistant     * >=100,000 COLONIES/mL KLEBSIELLA PNEUMONIAE     Studies: No results found.  Scheduled Meds: . amLODipine  10 mg Oral Daily  . aspirin  81 mg Oral BID  . demeclocycline  150 mg Oral Q12H  . enoxaparin (LOVENOX) injection  40 mg Subcutaneous Q24H  . insulin aspart  0-5 Units Subcutaneous QHS  . insulin aspart  0-9 Units Subcutaneous TID WC  . levothyroxine  112 mcg Oral QAC breakfast  . metoprolol tartrate  37.5 mg Oral BID   Continuous Infusions: . sodium chloride 10 mL/hr at 07/31/14 2058    Principal Problem:   Hyponatremia Active Problems:   Cardiomyopathy, ischemic   AKI (acute kidney injury)   UTI (lower urinary tract infection)   CKD (chronic kidney disease), stage III   Vertigo   Type 2 diabetes with nephropathy   Essential hypertension    Time spent: 75 Minutes    Kathie Dike, MD [facility] Pager [phone]. If 7PM-7AM, please contact night-coverage at [email or web address], password [facility] 08/01/2014, 12:41 PM  LOS: 7 days    I, Jessica D. Leonie Green, acting as scribe, recorded this note  contemporaneously in the presence of Dr. Kathie Dike, M.D. on 08/01/2014 at 12:47 PM   Attending:  I have reviewed the above documentation for accuracy and completeness, and I agree with the above.  Synethia Endicott

## 2014-08-01 NOTE — Care Management Important Message (Signed)
Important Message  Patient Details  Name: MERARY GARGUILO MRN: [id number] Date of Birth: Nov 19, 1932   Medicare Important Message Given:  Yes-third notification given    Joylene Draft, RN 08/01/2014, 11:29 AM

## 2014-08-01 NOTE — Progress Notes (Signed)
Subjective: Patient presently denies any weakness and overall feeling okay. She offers no complaints.  Objective: Vital signs in last 24 hours: Temp:  [98.3 F (36.8 C)-98.9 F (37.2 C)] 98.4 F (36.9 C) (07/29 0354) Pulse Rate:  [57-65] 65 (07/29 0354) Resp:  [18] 18 (07/29 0354) BP: (125-154)/(54-61) 125/54 mmHg (07/29 0354) SpO2:  [96 %-99 %] 99 % (07/29 0354)  Intake/Output from previous day: 07/28 0701 - 07/29 0700 In: 1172.8 [P.O.:600; I.V.:572.8] Out: -  Intake/Output this shift:    No results for input(s): HGB in the last 72 hours. No results for input(s): WBC, RBC, HCT, PLT in the last 72 hours.  Recent Labs  07/31/14 0542 08/01/14 0702  NA 128* 126*  K 3.8 3.9  CL 95* 92*  CO2 27 25  BUN 10 10  CREATININE 1.26* 1.29*  GLUCOSE 118* 123*  CALCIUM 8.6* 8.5*   No results for input(s): LABPT, INR in the last 72 hours.  Generally patient is alert and in no apparent distress. Presently she is sitting on a chair. Chest is clear to auscultation Heart exam reveals regular rate and rhythm no murmur Extremities no edema  Assessment/Plan: Problem #1 hyponatremia: Most likely SIADH. Presently patient is on free water restriction and also Celexa was discontinued. Presently her sodium continued to decline. Problem #2 renal failure: Renal function is stable. Problem #3 cardiomyopathy: No sign of fluid overload. Problem #4 history of diabetes Problem # 5 hypertension: Her blood pressure is reasonably controlled Problem #6 anemia: Her hemoglobin is stable Problem #7 UTI: Patient is a febrile. Plan: 1] We'll DC sodium tablet 2] will start patient on demeclocycline 150 mg by mouth twice a day 3] We'll check her basic metabolic panel in the morning.   Wei Newbrough S 08/01/2014, 9:01 AM

## 2014-08-02 LAB — BASIC METABOLIC PANEL
Anion gap: 6 (ref 5–15)
BUN: 13 mg/dL (ref 6–20)
CHLORIDE: 94 mmol/L — AB (ref 101–111)
CO2: 27 mmol/L (ref 22–32)
Calcium: 8.7 mg/dL — ABNORMAL LOW (ref 8.9–10.3)
Creatinine, Ser: 1.31 mg/dL — ABNORMAL HIGH (ref 0.44–1.00)
GFR calc Af Amer: 43 mL/min — ABNORMAL LOW (ref >60)
GFR calc non Af Amer: 37 mL/min — ABNORMAL LOW (ref >60)
GLUCOSE: 123 mg/dL — AB (ref 65–99)
Potassium: 4.1 mmol/L (ref 3.5–5.1)
SODIUM: 127 mmol/L — AB (ref 135–145)

## 2014-08-02 LAB — GLUCOSE, CAPILLARY: GLUCOSE-CAPILLARY: 134 mg/dL — AB (ref 65–99)

## 2014-08-02 MED ORDER — AMLODIPINE BESYLATE 10 MG PO TABS: 10.0000 mg | ORAL_TABLET | Freq: Every day | ORAL | Status: DC

## 2014-08-02 MED ORDER — DEMECLOCYCLINE HCL 150 MG PO TABS: 150.0000 mg | ORAL_TABLET | Freq: Two times a day (BID) | ORAL | Status: DC

## 2014-08-02 NOTE — Discharge Summary (Signed)
Physician Discharge Summary  Joann Hunter:[id number] DOB: [birth year] DOA: 07/25/2014  PCP: Monico Blitz, MD  Admit date: 07/25/2014 Discharge date: 08/02/2014  Time spent: 35 minutes  Recommendations for Outpatient Follow-up:  1. Will continue demeclocycline with current dose as outpatient  2. Follow-up in one week with Nephrology for medication adjustment and recheck of sodium  3. Celexa has been d/c due to hyponatremia, consider starting new anti-depressant as outpatient    Discharge Diagnoses:  Principal Problem:   Hyponatremia Active Problems:   Cardiomyopathy, ischemic   AKI (acute kidney injury)   UTI (lower urinary tract infection)   CKD (chronic kidney disease), stage III   Vertigo   Type 2 diabetes with nephropathy   Essential hypertension   Discharge Condition: Improved   Diet recommendation: heart healthy   Filed Weights   07/25/14 1508 07/25/14 1927  Weight: 71.215 kg (157 lb) 76.567 kg (168 lb 12.8 oz)    History of present illness:  81y/o female with PMHx of coronary artery disease with an STEMI in 2013, hyperglycemia, hypertension, hyperlipidemia, chronic vertigo. Patient has been treated with ciprofloxacin over the past week due to a urinary tract infection. The patient has had worsening vertigo during this time to the point where he is unable to stand or walk due to worsening of her symptoms. Sitting and resting improves her vertigo. She has attempted to use meclizine, which has not improved her symptoms.   Hospital Course:  Hyponatremia. Likely mulit- factorial related to volume depletion and SIADH. Seen by Nephrology during hospital course, started on fluid restriction and salt tablets with some improvement. Ultimately started on demeclocycline 150 mg twice a day. Serum sodiums currently stable. The pt does not have any complaints or change in mental status. TSH is normal. Celexa was discontinued due to hyponatremia. Cleared by Nephrology to d/c home  with close follow-up. She will have repeat BMET in 1 week by nephrology to ensure improvement in sodium  1. Possible UTI. UA on admission indicated possible infection and Urine culture is positive for Klebsiella Pneumoniae. Which is an ESBL. Since she is afebrile with no leukocytosis and has no urinary symptoms, this is likely a colonisation rather than a true infection. Antibiotics were discontinued.  2. Acute Renal Failure. Superimposed on CKD Stage III. Related to dehydration and improved on IVF. Creatinine is now back to baseline. 3. Hypertension. Blood pressure stable. She is on Metropolol and Norvasc which was added by Nephrology. Will continue to monitor. Further titrations of antihypertensives to be done as outpatient.  4. DM. Type 2. A1C 6.4 Pt was on sliding scale insulin. Would favor treating with dietary modifications on discharge.  5. Vertigo. Pt has history of vertigo and presented with worsening dizziness likely exacerbated by dehydration and hyponatremia. Symptoms have resolved since admission with hydration.  Procedures:    Consultations:  Nephrology     Discharge Exam: Filed Vitals:   08/02/14 0500  BP: 166/60  Pulse: 63  Temp: 98.7 F (37.1 C)  Resp: 20    6. General: NAD, afebrile, VSS, appears comfortable  7. Cardiovascular: S1, S2,RRR 8. Respiratory:  Clear bilaterally, with no wheezes, rales, or rhonchi  9. Abdomen: soft, ntnd, bowel sounds present  10. Musculoskeletal: No BLE edema   Discharge Instructions   Discharge Instructions    Diet - low sodium heart healthy    Complete by:  As directed      Increase activity slowly    Complete by:  As directed  Current Discharge Medication List    START taking these medications   Details  amLODipine (NORVASC) 10 MG tablet Take 1 tablet (10 mg total) by mouth daily. Qty: 30 tablet, Refills: 1    demeclocycline (DECLOMYCIN) 150 MG tablet Take 1 tablet (150 mg total) by mouth every 12 (twelve)  hours. Qty: 30 tablet, Refills: 0      CONTINUE these medications which have NOT CHANGED   Details  aspirin 81 MG chewable tablet Chew 81 mg by mouth 2 (two) times daily.     fish oil-omega-3 fatty acids 1000 MG capsule Take 1 g by mouth daily.    levothyroxine (SYNTHROID, LEVOTHROID) 112 MCG tablet Take 1 tablet (112 mcg total) by mouth every morning. Qty: 30 tablet, Refills: 0    meclizine (ANTIVERT) 25 MG tablet Take 25 mg by mouth 4 (four) times daily as needed. Dizziness    metoprolol tartrate (LOPRESSOR) 25 MG tablet Take 25 mg by mouth 2 (two) times daily.     nitroGLYCERIN (NITROSTAT) 0.4 MG SL tablet Place 0.4 mg under the tongue every 5 (five) minutes x 3 doses as needed. For chest pain.    ondansetron (ZOFRAN-ODT) 4 MG disintegrating tablet Take 4 mg by mouth every 8 (eight) hours as needed for nausea or vomiting.    traMADol (ULTRAM) 50 MG tablet Take 50 mg by mouth 2 (two) times daily. As needed for back pain    Vitamin D, Ergocalciferol, (DRISDOL) 50000 UNITS CAPS Take 50,000 Units by mouth 2 (two) times a week. Tuesdays and Thurdays      STOP taking these medications     ciprofloxacin (CIPRO) 500 MG tablet      citalopram (CELEXA) 10 MG tablet        Allergies  Allergen Reactions  . Ciprofloxacin Other (See Comments)    Hallucination  . Lipitor [Atorvastatin]   . Penicillins Rash   Follow-up Information    Follow up with [facility] S, MD In 1 week.   Specialty:  Nephrology   Why:  To check her basic metabolic panel and follow-up   Contact information:   [address] [place] [address] [phone]        The results of significant diagnostics from this hospitalization (including imaging, microbiology, ancillary and laboratory) are listed below for reference.    Significant Diagnostic Studies: Dg Chest 2 View  07/27/2014   CLINICAL DATA:  Generalized weakness and chest congestion over the past several days. Current history of  diabetes, hypertension, coronary artery disease with MI and cardiomyopathy.  EXAM: CHEST  2 VIEW  COMPARISON:  07/25/2014 and earlier.  FINDINGS: Cardiac silhouette mildly enlarged, unchanged. Thoracic aorta atherosclerotic, unchanged. Hilar and mediastinal contours otherwise unremarkable. Minimal pulmonary venous hypertension without overt edema. Minimal linear atelectasis in the lingula. Lungs otherwise clear. No localized airspace consolidation. No pleural effusions. No pneumothorax. Degenerative changes involving the thoracic spine. Note made of a compression fracture of the upper endplate of what I believe is L2.  IMPRESSION: Stable mild cardiomegaly. Minimal linear atelectasis in the lingula. No acute cardiopulmonary disease otherwise.   Electronically Signed   By: Evangeline Dakin M.D.   On: 07/27/2014 13:51   Dg Chest 2 View  07/25/2014   CLINICAL DATA:  Dizziness and nausea today and for several months ; history coronary artery disease post MI, ischemic cardiomyopathy, hypertension  EXAM: CHEST  2 VIEW  COMPARISON:  04/04/2014  FINDINGS: Enlargement of cardiac silhouette.  Atherosclerotic calcification.  Mediastinal contours normal.  Slight pulmonary vascular congestion.  Peribronchial thickening and accentuation of perihilar markings could represent bronchitis or subtle early failure.  Minimal RIGHT basilar atelectasis.  No pleural effusion or pneumothorax.  Diffuse osseous demineralization.  IMPRESSION: Enlargement of cardiac silhouette with pulmonary vascular congestion.  Peribronchial thickening and accentuated perihilar markings question bronchitis or potentially subtle early failure.  No segmental consolidation.   Electronically Signed   By: Lavonia Dana M.D.   On: 07/25/2014 16:54    Microbiology: Recent Results (from the past 240 hour(s))  Culture, Urine     Status: None   Collection Time: 07/26/14  6:00 PM  Result Value Ref Range Status   Specimen Description URINE, CLEAN CATCH  Final    Special Requests NONE  Final   Culture   Final    >=100,000 COLONIES/mL KLEBSIELLA PNEUMONIAE Confirmed Extended Spectrum Beta-Lactamase Producer (ESBL) MODIFIED HODGE TEST NEGATIVE RESULT CALLED TO, READ BACK BY AND VERIFIED WITHMarge Duncans AT 1478 07/30/14 Performed at [facility]    Report Status 08/01/2014 FINAL  Final   Organism ID, Bacteria KLEBSIELLA PNEUMONIAE  Final      Susceptibility   Klebsiella pneumoniae - MIC*    AMPICILLIN >=32 RESISTANT Resistant     CEFAZOLIN >=64 RESISTANT Resistant     CEFTRIAXONE >=64 RESISTANT Resistant     CIPROFLOXACIN >=4 RESISTANT Resistant     GENTAMICIN >=16 RESISTANT Resistant     IMIPENEM <=0.25 RESISTANT Resistant     NITROFURANTOIN 128 RESISTANT Resistant     TRIMETH/SULFA 40 SENSITIVE Sensitive     AMPICILLIN/SULBACTAM >=32 RESISTANT Resistant     PIP/TAZO >=128 RESISTANT Resistant     * >=100,000 COLONIES/mL KLEBSIELLA PNEUMONIAE     Labs: Basic Metabolic Panel:  Recent Labs Lab 07/29/14 0604 07/30/14 0546 07/31/14 0542 08/01/14 0702 08/02/14 0629  NA 128* 127* 128* 126* 127*  K 3.9 3.7 3.8 3.9 4.1  CL 95* 97* 95* 92* 94*  CO2 25 24 27 25 27   GLUCOSE 113* 125* 118* 123* 123*  BUN 8 8 10 10 13   CREATININE 1.39* 1.19* 1.26* 1.29* 1.31*  CALCIUM 8.5* 8.3* 8.6* 8.5* 8.7*   CBC:  Recent Labs Lab 07/27/14 0611 07/28/14 0655 07/29/14 0604  WBC 4.9 5.5 5.1  HGB 10.0* 10.5* 10.8*  HCT 28.9* 30.4* 30.4*  MCV 91.5 90.7 89.9  PLT 198 197 228   BNP: BNP (last 3 results)  Recent Labs  07/26/14 0557  BNP 155.0*    ProBNP (last 3 results)  Recent Labs  12/15/13 1245  PROBNP 2030.0*    CBG:  Recent Labs Lab 08/01/14 0729 08/01/14 1111 08/01/14 1625 08/01/14 2232 08/02/14 0758  GLUCAP 117* 194* 115* 137* 134*       Signed:  Kathie Dike, MD [facility] 08/02/2014, 10:00 AM   I, Norg'e Tisdol, acting a scribe, recorded this note contemporaneously in the presence of Dr.  Kathie Dike, M.D. on 08/02/2014  at 10:00 AM    Attending: I have reviewed the above documentation for accuracy and completeness, and I agree with the above.  MEMON,JEHANZEB

## 2014-08-02 NOTE — Progress Notes (Signed)
Subjective: No new complaints. Patient denies any weakness. She denies also any difficulty breathing.  Objective: Vital signs in last 24 hours: Temp:  [98.1 F (36.7 C)-98.7 F (37.1 C)] 98.7 F (37.1 C) (07/30 0500) Pulse Rate:  [57-63] 63 (07/30 0500) Resp:  [18-20] 20 (07/30 0500) BP: (124-166)/(44-62) 166/60 mmHg (07/30 0500) SpO2:  [98 %-99 %] 99 % (07/30 0500)  Intake/Output from previous day: 07/29 0701 - 07/30 0700 In: 360 [P.O.:360] Out: -  Intake/Output this shift:    No results for input(s): HGB in the last 72 hours. No results for input(s): WBC, RBC, HCT, PLT in the last 72 hours.  Recent Labs  08/01/14 0702 08/02/14 0629  NA 126* 127*  K 3.9 4.1  CL 92* 94*  CO2 25 27  BUN 10 13  CREATININE 1.29* 1.31*  GLUCOSE 123* 123*  CALCIUM 8.5* 8.7*   No results for input(s): LABPT, INR in the last 72 hours.  Generally patient is alert and in no apparent distress.  Chest is clear to auscultation Heart exam reveals regular rate and rhythm no murmur Extremities no edema  Assessment/Plan: Problem #1 hyponatremia: Most likely SIADH. Patient is started on low dose of demeclocycline. Her sodium is 1.7 low but improving. Problem #2 renal failure: Renal function is stable. Problem #3 cardiomyopathy: No sign of fluid overload. Problem #4 history of diabetes: Her blood glucose is 123 and patient does not have any polydipsia or polyuria. Problem # 5 hypertension: Her blood pressure is reasonably controlled Problem #6 anemia: Her hemoglobin is stable Problem #7 UTI: Patient is a febrile. Plan: 1] We'll continue his demeclocycline with present dose 2] out follow patient in one week and make an adjustment on her medication if she is going to be discharged today.   Lyrical Sowle S 08/02/2014, 9:07 AM

## 2014-08-02 NOTE — Progress Notes (Signed)
Patient states understanding of discharge instructions.  

## 2014-09-14 ENCOUNTER — Emergency Department (HOSPITAL_COMMUNITY): Payer: Medicare Other

## 2014-09-14 ENCOUNTER — Inpatient Hospital Stay (HOSPITAL_COMMUNITY)
Admission: EM | Admit: 2014-09-14 | Discharge: 2014-09-18 | DRG: 682 | Disposition: A | Discharge status: Home / Self Care | Payer: Medicare Other | Attending: Family Medicine | Admitting: Family Medicine

## 2014-09-14 ENCOUNTER — Encounter (HOSPITAL_COMMUNITY): Payer: Self-pay | Admitting: Emergency Medicine

## 2014-09-14 DIAGNOSIS — E1121 Type 2 diabetes mellitus with diabetic nephropathy: Secondary | ICD-10-CM | POA: Diagnosis present

## 2014-09-14 DIAGNOSIS — I255 Ischemic cardiomyopathy: Secondary | ICD-10-CM | POA: Diagnosis present

## 2014-09-14 DIAGNOSIS — E538 Deficiency of other specified B group vitamins: Secondary | ICD-10-CM | POA: Diagnosis present

## 2014-09-14 DIAGNOSIS — I252 Old myocardial infarction: Secondary | ICD-10-CM | POA: Diagnosis not present

## 2014-09-14 DIAGNOSIS — I10 Essential (primary) hypertension: Secondary | ICD-10-CM | POA: Diagnosis present

## 2014-09-14 DIAGNOSIS — E1165 Type 2 diabetes mellitus with hyperglycemia: Secondary | ICD-10-CM | POA: Diagnosis present

## 2014-09-14 DIAGNOSIS — I129 Hypertensive chronic kidney disease with stage 1 through stage 4 chronic kidney disease, or unspecified chronic kidney disease: Secondary | ICD-10-CM | POA: Diagnosis present

## 2014-09-14 DIAGNOSIS — E860 Dehydration: Secondary | ICD-10-CM | POA: Diagnosis present

## 2014-09-14 DIAGNOSIS — G9341 Metabolic encephalopathy: Secondary | ICD-10-CM | POA: Diagnosis present

## 2014-09-14 DIAGNOSIS — R4182 Altered mental status, unspecified: Secondary | ICD-10-CM | POA: Diagnosis present

## 2014-09-14 DIAGNOSIS — N390 Urinary tract infection, site not specified: Secondary | ICD-10-CM | POA: Diagnosis present

## 2014-09-14 DIAGNOSIS — N184 Chronic kidney disease, stage 4 (severe): Secondary | ICD-10-CM | POA: Diagnosis present

## 2014-09-14 DIAGNOSIS — Z955 Presence of coronary angioplasty implant and graft: Secondary | ICD-10-CM | POA: Diagnosis not present

## 2014-09-14 DIAGNOSIS — E039 Hypothyroidism, unspecified: Secondary | ICD-10-CM | POA: Diagnosis present

## 2014-09-14 DIAGNOSIS — N179 Acute kidney failure, unspecified: Principal | ICD-10-CM | POA: Diagnosis present

## 2014-09-14 DIAGNOSIS — G9340 Encephalopathy, unspecified: Secondary | ICD-10-CM | POA: Diagnosis not present

## 2014-09-14 DIAGNOSIS — R627 Adult failure to thrive: Secondary | ICD-10-CM | POA: Diagnosis present

## 2014-09-14 DIAGNOSIS — E785 Hyperlipidemia, unspecified: Secondary | ICD-10-CM | POA: Diagnosis present

## 2014-09-14 DIAGNOSIS — N183 Chronic kidney disease, stage 3 (moderate): Secondary | ICD-10-CM | POA: Diagnosis not present

## 2014-09-14 DIAGNOSIS — R410 Disorientation, unspecified: Secondary | ICD-10-CM | POA: Diagnosis not present

## 2014-09-14 DIAGNOSIS — M1990 Unspecified osteoarthritis, unspecified site: Secondary | ICD-10-CM | POA: Diagnosis present

## 2014-09-14 LAB — URINALYSIS, ROUTINE W REFLEX MICROSCOPIC
BILIRUBIN URINE: NEGATIVE
Glucose, UA: NEGATIVE mg/dL
Ketones, ur: NEGATIVE mg/dL
Nitrite: POSITIVE — AB
Specific Gravity, Urine: >1.03 — ABNORMAL HIGH (ref 1.005–1.030)
Urobilinogen, UA: 0.2 mg/dL (ref 0.0–1.0)
pH: 6 (ref 5.0–8.0)

## 2014-09-14 LAB — GLUCOSE, CAPILLARY: GLUCOSE-CAPILLARY: 162 mg/dL — AB (ref 65–99)

## 2014-09-14 LAB — COMPREHENSIVE METABOLIC PANEL
ALBUMIN: 3.4 g/dL — AB (ref 3.5–5.0)
ALT: 10 U/L — ABNORMAL LOW (ref 14–54)
ANION GAP: 7 (ref 5–15)
AST: 19 U/L (ref 15–41)
Alkaline Phosphatase: 64 U/L (ref 38–126)
BILIRUBIN TOTAL: 0.5 mg/dL (ref 0.3–1.2)
BUN: 29 mg/dL — ABNORMAL HIGH (ref 6–20)
CO2: 27 mmol/L (ref 22–32)
Calcium: 9.3 mg/dL (ref 8.9–10.3)
Chloride: 101 mmol/L (ref 101–111)
Creatinine, Ser: 2.11 mg/dL — ABNORMAL HIGH (ref 0.44–1.00)
GFR calc non Af Amer: 21 mL/min — ABNORMAL LOW (ref >60)
GFR, EST AFRICAN AMERICAN: 24 mL/min — AB (ref >60)
GLUCOSE: 187 mg/dL — AB (ref 65–99)
Potassium: 4.5 mmol/L (ref 3.5–5.1)
Sodium: 135 mmol/L (ref 135–145)
Total Protein: 6.8 g/dL (ref 6.5–8.1)

## 2014-09-14 LAB — CBC
HCT: 34.9 % — ABNORMAL LOW (ref 36.0–46.0)
HEMOGLOBIN: 11.8 g/dL — AB (ref 12.0–15.0)
MCH: 31.6 pg (ref 26.0–34.0)
MCHC: 33.8 g/dL (ref 30.0–36.0)
MCV: 93.3 fL (ref 78.0–100.0)
Platelets: 259 10*3/uL (ref 150–400)
RBC: 3.74 MIL/uL — ABNORMAL LOW (ref 3.87–5.11)
RDW: 13.4 % (ref 11.5–15.5)
WBC: 5.5 10*3/uL (ref 4.0–10.5)

## 2014-09-14 LAB — URINE MICROSCOPIC-ADD ON

## 2014-09-14 LAB — TSH: TSH: 10.201 u[IU]/mL — AB (ref 0.350–4.500)

## 2014-09-14 LAB — TROPONIN I: Troponin I: <0.03 ng/mL (ref <0.031)

## 2014-09-14 MED ORDER — DEXTROSE 5 % IV SOLN
1.0000 g | INTRAVENOUS | Status: DC
  Administered 2014-09-15 – 2014-09-17 (×3): 1 g via INTRAVENOUS
  Filled 2014-09-14 (×4): qty 10

## 2014-09-14 MED ORDER — DEXTROSE 5 % IV SOLN
1.0000 g | Freq: Once | INTRAVENOUS | Status: AC
  Administered 2014-09-14: 1 g via INTRAVENOUS
  Filled 2014-09-14: qty 10

## 2014-09-14 MED ORDER — ONDANSETRON HCL 4 MG/2ML IJ SOLN: 4.0000 mg | Freq: Four times a day (QID) | INTRAMUSCULAR | Status: DC | PRN

## 2014-09-14 MED ORDER — AMLODIPINE BESYLATE 5 MG PO TABS
5.0000 mg | ORAL_TABLET | Freq: Every day | ORAL | Status: DC
  Administered 2014-09-15 – 2014-09-18 (×4): 5 mg via ORAL
  Filled 2014-09-14 (×4): qty 1

## 2014-09-14 MED ORDER — ONDANSETRON 4 MG PO TBDP: 4.0000 mg | ORAL_TABLET | Freq: Three times a day (TID) | ORAL | Status: DC | PRN

## 2014-09-14 MED ORDER — NITROGLYCERIN 0.4 MG SL SUBL: 0.4000 mg | SUBLINGUAL_TABLET | SUBLINGUAL | Status: DC | PRN

## 2014-09-14 MED ORDER — ENOXAPARIN SODIUM 30 MG/0.3ML ~~LOC~~ SOLN
30.0000 mg | SUBCUTANEOUS | Status: DC
Start: 2014-09-14 — End: 2014-09-18
  Administered 2014-09-14 – 2014-09-17 (×4): 30 mg via SUBCUTANEOUS
  Filled 2014-09-14 (×4): qty 0.3

## 2014-09-14 MED ORDER — INSULIN ASPART 100 UNIT/ML ~~LOC~~ SOLN: 0.0000 [IU] | Freq: Every day | SUBCUTANEOUS | Status: DC

## 2014-09-14 MED ORDER — METOPROLOL TARTRATE 25 MG PO TABS
25.0000 mg | ORAL_TABLET | Freq: Two times a day (BID) | ORAL | Status: DC
  Administered 2014-09-14 – 2014-09-18 (×7): 25 mg via ORAL
  Filled 2014-09-14 (×8): qty 1

## 2014-09-14 MED ORDER — VITAMIN D (ERGOCALCIFEROL) 1.25 MG (50000 UNIT) PO CAPS
50000.0000 [IU] | ORAL_CAPSULE | ORAL | Status: DC
  Administered 2014-09-15 – 2014-09-18 (×2): 50000 [IU] via ORAL
  Filled 2014-09-14 (×2): qty 1

## 2014-09-14 MED ORDER — LEVOTHYROXINE SODIUM 112 MCG PO TABS
112.0000 ug | ORAL_TABLET | Freq: Every morning | ORAL | Status: DC
  Filled 2014-09-14: qty 1

## 2014-09-14 MED ORDER — INSULIN ASPART 100 UNIT/ML ~~LOC~~ SOLN
0.0000 [IU] | Freq: Three times a day (TID) | SUBCUTANEOUS | Status: DC
  Administered 2014-09-15: 1 [IU] via SUBCUTANEOUS
  Administered 2014-09-15: 3 [IU] via SUBCUTANEOUS
  Administered 2014-09-16 – 2014-09-17 (×3): 1 [IU] via SUBCUTANEOUS

## 2014-09-14 MED ORDER — ONDANSETRON HCL 4 MG PO TABS: 4.0000 mg | ORAL_TABLET | Freq: Four times a day (QID) | ORAL | Status: DC | PRN

## 2014-09-14 MED ORDER — TRAMADOL HCL 50 MG PO TABS
50.0000 mg | ORAL_TABLET | Freq: Two times a day (BID) | ORAL | Status: DC
  Administered 2014-09-14 – 2014-09-18 (×8): 50 mg via ORAL
  Filled 2014-09-14 (×8): qty 1

## 2014-09-14 MED ORDER — OMEGA-3 FATTY ACIDS 1000 MG PO CAPS
1.0000 g | ORAL_CAPSULE | Freq: Every day | ORAL | Status: DC
  Filled 2014-09-14 (×5): qty 1

## 2014-09-14 MED ORDER — MECLIZINE HCL 12.5 MG PO TABS: 25.0000 mg | ORAL_TABLET | Freq: Four times a day (QID) | ORAL | Status: DC | PRN

## 2014-09-14 MED ORDER — ASPIRIN 81 MG PO CHEW
81.0000 mg | CHEWABLE_TABLET | Freq: Every day | ORAL | Status: DC
  Administered 2014-09-15 – 2014-09-18 (×4): 81 mg via ORAL
  Filled 2014-09-14 (×4): qty 1

## 2014-09-14 MED ORDER — SODIUM CHLORIDE 0.9 % IV SOLN
INTRAVENOUS | Status: DC
  Administered 2014-09-14 – 2014-09-17 (×6): via INTRAVENOUS

## 2014-09-14 NOTE — H&P (Signed)
[facility] History and Physical  [name] Hunter:[id number] DOB: Jan 19, 1932 DOA: 09/14/2014  Referring physician: ER PCP: Monico Blitz, MD   Chief Complaint: Altered mental status  HPI: Joann Hunter is a 81 y.o. female  This is an 81 year old lady who is brought in by her daughter because of acute confusion that occurred yesterday. However, the daughter says that she has been intermittently confused for the last 1 month. The daughter also noticed that she intermittently has difficulty with swallowing but has not had any choking episodes. She denies any vomiting. The daughter brought for her Zantac to see if this would help but it appears not to have done so far. She also apparently was being treated for UTI on second course of antibiotics. Evaluation in the emergency room shows findings consistent with UTI and worsening renal failure. He is now being admitted for further management and investigation.   Review of Systems:  Unable to obtain review of systems secondary to the patient's altered mental status.  Past Medical History  Diagnosis Date  . Hypothyroidism   . CAD (coronary artery disease)     NSTEMI 04/2011 with newly diagnosed 3V CAD s/p PCI/DES mLAD 04/11/11 with residual dz (per Dr. Burt Knack, should have consideration of PCI of the occluded RCA based on symptoms and/or consideration of outpatient nuclear perfusion testing after the patient recovers from her infarct)  . Ischemic cardiomyopathy     EF 45% by cath 04/20/11, 50-55% by echo 04/22/11. hypotension limiting medication titration   . Hyperlipidemia   . Hyperglycemia   . Hypertension   . Back pain, chronic   . Myocardial infarction 2013  . UTI (urinary tract infection) 12/13/2013  . Arthritis   . Type 2 diabetes with nephropathy 07/26/2014   Past Surgical History  Procedure Laterality Date  . Breast lumpectomy      right  . Coronary stent placement    . Back surgery    . Left heart catheterization with  coronary angiogram N/A 04/20/2011    Procedure: LEFT HEART CATHETERIZATION WITH CORONARY ANGIOGRAM;  Surgeon: Burnell Blanks, MD;  Location: [facility] CATH LAB;  Service: Cardiovascular;  Laterality: N/A;  . Percutaneous coronary stent intervention (pci-s) N/A 04/21/2011    Procedure: PERCUTANEOUS CORONARY STENT INTERVENTION (PCI-S);  Surgeon: Sherren Mocha, MD;  Location: [facility] CATH LAB;  Service: Cardiovascular;  Laterality: N/A;   Social History:  reports that she has never smoked. She has never used smokeless tobacco. She reports that she does not drink alcohol or use illicit drugs.  Allergies  Allergen Reactions  . Ciprofloxacin Other (See Comments)    Hallucination  . Lipitor [Atorvastatin] Other (See Comments)    Muscle Weakness  . Penicillins Rash    Family History  Problem Relation Age of Onset  . Other      no known family cardiac disease    Prior to Admission medications   Medication Sig Start Date End Date Taking? Authorizing Provider  amLODipine (NORVASC) 10 MG tablet Take 1 tablet (10 mg total) by mouth daily. Patient taking differently: Take 5 mg by mouth daily.  08/02/14  Yes Kathie Dike, MD  aspirin 81 MG chewable tablet Chew 81 mg by mouth daily.    Yes Historical Provider, MD  doxycycline (VIBRA-TABS) 100 MG tablet Take 100 mg by mouth 2 (two) times daily.   Yes Historical Provider, MD  fish oil-omega-3 fatty acids 1000 MG capsule Take 1 g by mouth daily.   Yes Historical Provider, MD  levothyroxine (SYNTHROID,  LEVOTHROID) 112 MCG tablet Take 1 tablet (112 mcg total) by mouth every morning. 12/16/13  Yes Verlee Monte, MD  meclizine (ANTIVERT) 25 MG tablet Take 25 mg by mouth 4 (four) times daily as needed. Dizziness   Yes Historical Provider, MD  metoprolol tartrate (LOPRESSOR) 25 MG tablet Take 25 mg by mouth 2 (two) times daily.  04/23/11  Yes Dayna N Dunn, PA-C  ondansetron (ZOFRAN-ODT) 4 MG disintegrating tablet Take 4 mg by mouth every 8 (eight) hours as needed  for nausea or vomiting.   Yes Historical Provider, MD  RANITIDINE HCL PO Take 1 tablet by mouth 2 (two) times daily.   Yes Historical Provider, MD  traMADol (ULTRAM) 50 MG tablet Take 50 mg by mouth 2 (two) times daily.  04/26/14  Yes Historical Provider, MD  demeclocycline (DECLOMYCIN) 150 MG tablet Take 1 tablet (150 mg total) by mouth every 12 (twelve) hours. 08/02/14   Kathie Dike, MD  nitroGLYCERIN (NITROSTAT) 0.4 MG SL tablet Place 0.4 mg under the tongue every 5 (five) minutes x 3 doses as needed. For chest pain. 04/23/11   Dayna N Dunn, PA-C  Vitamin D, Ergocalciferol, (DRISDOL) 50000 UNITS CAPS Take 50,000 Units by mouth 2 (two) times a week. Tuesdays and Thurdays 05/30/12   Historical Provider, MD   Physical Exam: Filed Vitals:   09/14/14 1218 09/14/14 1551 09/14/14 1600 09/14/14 1612  BP: 129/52 148/57 138/55 138/55  Pulse: 50 66 66 64  Temp: 97.5 F (36.4 C)     TempSrc: Oral     Resp: 16   25  Height: 5\' 2"  (1.575 m)     SpO2: 100% 99% 98% 98%    Wt Readings from Last 3 Encounters:  07/25/14 76.567 kg (168 lb 12.8 oz)  06/11/14 75.751 kg (167 lb)  04/04/14 72.576 kg (160 lb)    General:  Appears pleasantly confused. Not toxic clinically. Somewhat dehydrated. Eyes: PERRL, normal lids, irises & conjunctiva ENT: grossly normal hearing, lips & tongue Neck: no LAD, masses or thyromegaly Cardiovascular: RRR, no m/r/g. No LE edema. Telemetry: SR, no arrhythmias  Respiratory: CTA bilaterally, no w/r/r. Normal respiratory effort. Abdomen: soft, ntnd Skin: no rash or induration seen on limited exam Musculoskeletal: grossly normal tone BUE/BLE Psychiatric: Not examined. Neurologic: grossly non-focal.          Labs on Admission:  Basic Metabolic Panel:  Recent Labs Lab 09/14/14 1305  NA 135  K 4.5  CL 101  CO2 27  GLUCOSE 187*  BUN 29*  CREATININE 2.11*  CALCIUM 9.3   Liver Function Tests:  Recent Labs Lab 09/14/14 1305  AST 19  ALT 10*  ALKPHOS 64    BILITOT 0.5  PROT 6.8  ALBUMIN 3.4*   No results for input(s): LIPASE, AMYLASE in the last 168 hours. No results for input(s): AMMONIA in the last 168 hours. CBC:  Recent Labs Lab 09/14/14 1305  WBC 5.5  HGB 11.8*  HCT 34.9*  MCV 93.3  PLT 259   Cardiac Enzymes:  Recent Labs Lab 09/14/14 1305  TROPONINI <0.03    BNP (last 3 results)  Recent Labs  07/26/14 0557  BNP 155.0*    ProBNP (last 3 results)  Recent Labs  12/15/13 1245  PROBNP 2030.0*    CBG: No results for input(s): GLUCAP in the last 168 hours.  Radiological Exams on Admission: Dg Chest 2 View  09/14/2014   CLINICAL DATA:  Altered mental status. Chronic urinary tract infection. Increase confusion.  EXAM: CHEST  2 VIEW  COMPARISON:  07/27/2014.  02/13/2013.  FINDINGS: Cardiopericardial silhouette is mildly enlarged for projection. This is unchanged compared to 7/20 04/2014. Aortic arch atherosclerosis. There is no airspace disease or effusion. Subsegmental atelectasis or scarring is present at the LEFT base. Osteopenia. Monitoring leads project over the chest.  IMPRESSION: Mild cardiomegaly without failure. No acute cardiopulmonary disease.   Electronically Signed   By: Dereck Ligas M.D.   On: 09/14/2014 16:42   Ct Head Wo Contrast  09/14/2014   CLINICAL DATA:  Altered mental status. Increasing confusion. Chronic urinary tract infection.  EXAM: CT HEAD WITHOUT CONTRAST  TECHNIQUE: Contiguous axial images were obtained from the base of the skull through the vertex without intravenous contrast.  COMPARISON:  04/04/2014.  FINDINGS: No mass lesion, mass effect, midline shift, hydrocephalus, hemorrhage. No acute territorial cortical ischemia/infarct. Atrophy and chronic ischemic white matter disease is present.  IMPRESSION: Atrophy and chronic ischemic white matter disease without acute intracranial abnormality.   Electronically Signed   By: Dereck Ligas M.D.   On: 09/14/2014 16:55       Assessment/Plan   1. Altered mental status. The etiology is not clear but may be multifactorial. She has worsening renal function and UTI. She also has atrophic and chronic ischemic changes in the CT scan. I will order MRI brain scan to see if she is actually had any small strokes. I will ask neurology to see the patient in consultation. She'll be treated with intravenous antibiotics for the UTI and given IV fluids. 2. UTI. IV antibiotics. 3. Acute kidney injury on chronic kidney disease. IV fluids. 4. Diabetes. This appears to be diet controlled. Sliding scale of insulin nonetheless. 5. Hypertension. Stable.  Further recommendations will depend on patient's hospital progress.   Code Status: Full code  DVT Prophylaxis: Lovenox.  Family Communication: I discussed the plan with the patient's daughter at the bedside.   Disposition Plan: Home when medically stable.   Time spent: 60 minutes.  Doree Albee [facility] Pager [phone].

## 2014-09-14 NOTE — ED Notes (Addendum)
Pt daughter reports that pt seen for same in July. Pt daughter reports chronic UTI. Pt daughter reports increased confusion. Pt daughter reprots intermittently food gets "stuck". Pt daughter reports pt has hiatal hernia as well.pt alert. nad noted. airway patent.

## 2014-09-14 NOTE — ED Notes (Signed)
MD at bedside. 

## 2014-09-14 NOTE — ED Provider Notes (Signed)
CSN: [id number]     Arrival date & time 09/14/14  1209 History   First MD Initiated Contact with Patient 09/14/14 1530     Chief Complaint  Patient presents with  . Altered Mental Status     (Consider location/radiation/quality/duration/timing/severity/associated sxs/prior Treatment) HPI Level 5-caveat altered mental status. History is obtained from her daughter who accompanies her Patient has been confused since yesterday, asking her daughter when she would go home when she was at home and with some agitation. Daughter also reports that food has gotten stuck in patient's esophagus for approximately the past 2 weeks. She vomits within seconds after swallowing. Usually occurs in the morning. She is currently being treated for urinary tract infection, on her second antibiotics. Switch to doxycycline last week. Patient is presently asymptomatic Past Medical History  Diagnosis Date  . Hypothyroidism   . CAD (coronary artery disease)     NSTEMI 04/2011 with newly diagnosed 3V CAD s/p PCI/DES mLAD 04/11/11 with residual dz (per Dr. Burt Knack, should have consideration of PCI of the occluded RCA based on symptoms and/or consideration of outpatient nuclear perfusion testing after the patient recovers from her infarct)  . Ischemic cardiomyopathy     EF 45% by cath 04/20/11, 50-55% by echo 04/22/11. hypotension limiting medication titration   . Hyperlipidemia   . Hyperglycemia   . Hypertension   . Back pain, chronic   . Myocardial infarction 2013  . UTI (urinary tract infection) 12/13/2013  . Arthritis   . Type 2 diabetes with nephropathy 07/26/2014   Past Surgical History  Procedure Laterality Date  . Breast lumpectomy      right  . Coronary stent placement    . Back surgery    . Left heart catheterization with coronary angiogram N/A 04/20/2011    Procedure: LEFT HEART CATHETERIZATION WITH CORONARY ANGIOGRAM;  Surgeon: Burnell Blanks, MD;  Location: [facility] CATH LAB;  Service: Cardiovascular;   Laterality: N/A;  . Percutaneous coronary stent intervention (pci-s) N/A 04/21/2011    Procedure: PERCUTANEOUS CORONARY STENT INTERVENTION (PCI-S);  Surgeon: Sherren Mocha, MD;  Location: [facility] CATH LAB;  Service: Cardiovascular;  Laterality: N/A;   Family History  Problem Relation Age of Onset  . Other      no known family cardiac disease   Social History  Substance Use Topics  . Smoking status: Never Smoker   . Smokeless tobacco: Never Used  . Alcohol Use: No   OB History    Gravida Para Term Preterm AB TAB SAB Ectopic Multiple Living   2 2 2       2      Review of Systems  Unable to perform ROS: Mental status change  Gastrointestinal: Positive for vomiting.      Allergies  Ciprofloxacin; Lipitor; and Penicillins  Home Medications   Prior to Admission medications   Medication Sig Start Date End Date Taking? Authorizing Provider  amLODipine (NORVASC) 10 MG tablet Take 1 tablet (10 mg total) by mouth daily. 08/02/14   Kathie Dike, MD  aspirin 81 MG chewable tablet Chew 81 mg by mouth 2 (two) times daily.     Historical Provider, MD  demeclocycline (DECLOMYCIN) 150 MG tablet Take 1 tablet (150 mg total) by mouth every 12 (twelve) hours. 08/02/14   Kathie Dike, MD  fish oil-omega-3 fatty acids 1000 MG capsule Take 1 g by mouth daily.    Historical Provider, MD  levothyroxine (SYNTHROID, LEVOTHROID) 112 MCG tablet Take 1 tablet (112 mcg total) by mouth every morning. 12/16/13  Verlee Monte, MD  meclizine (ANTIVERT) 25 MG tablet Take 25 mg by mouth 4 (four) times daily as needed. Dizziness    Historical Provider, MD  metoprolol tartrate (LOPRESSOR) 25 MG tablet Take 25 mg by mouth 2 (two) times daily.  04/23/11   Dayna N Dunn, PA-C  nitroGLYCERIN (NITROSTAT) 0.4 MG SL tablet Place 0.4 mg under the tongue every 5 (five) minutes x 3 doses as needed. For chest pain. 04/23/11   Dayna N Dunn, PA-C  ondansetron (ZOFRAN-ODT) 4 MG disintegrating tablet Take 4 mg by mouth every 8 (eight)  hours as needed for nausea or vomiting.    Historical Provider, MD  traMADol (ULTRAM) 50 MG tablet Take 50 mg by mouth 2 (two) times daily. As needed for back pain 04/26/14   Historical Provider, MD  Vitamin D, Ergocalciferol, (DRISDOL) 50000 UNITS CAPS Take 50,000 Units by mouth 2 (two) times a week. Tuesdays and Thurdays 05/30/12   Historical Provider, MD   BP 129/52 mmHg  Pulse 50  Temp(Src) 97.5 F (36.4 C) (Oral)  Resp 16  Ht 5\' 2"  (1.575 m)  Wt   SpO2 100% Physical Exam  Constitutional: She appears well-developed and well-nourished.  HENT:  Head: Normocephalic and atraumatic.  Eyes: Conjunctivae are normal. Pupils are equal, round, and reactive to light.  Neck: Neck supple. No tracheal deviation present. No thyromegaly present.  Cardiovascular: Normal rate and regular rhythm.   No murmur heard. Pulmonary/Chest: Effort normal and breath sounds normal.  Abdominal: Soft. Bowel sounds are normal. She exhibits no distension. There is no tenderness.  Musculoskeletal: Normal range of motion. She exhibits no edema or tenderness.  Neurological: She is alert. A cranial nerve deficit is present. Coordination normal.  Oriented to name and hospital does not know month or year it moves all extremities well but strength 5 over 5 overall  Skin: Skin is warm and dry. No rash noted.  Psychiatric: She has a normal mood and affect.  Nursing note and vitals reviewed.   ED Course  Procedures (including critical care time) Labs Review Labs Reviewed  COMPREHENSIVE METABOLIC PANEL - Abnormal; Notable for the following:    Glucose, Bld 187 (*)    BUN 29 (*)    Creatinine, Ser 2.11 (*)    Albumin 3.4 (*)    ALT 10 (*)    GFR calc non Af Amer 21 (*)    GFR calc Af Amer 24 (*)    All other components within normal limits  CBC - Abnormal; Notable for the following:    RBC 3.74 (*)    Hemoglobin 11.8 (*)    HCT 34.9 (*)    All other components within normal limits    Imaging Review No results  found. I have personally reviewed and evaluated these images and lab results as part of my medical decision-making.   EKG Interpretation   Date/Time:  Sunday September 14 2014 16:01:18 EDT Ventricular Rate:  64 PR Interval:    QRS Duration: 79 QT Interval:  419 QTC Calculation: 432 R Axis:   5 Text Interpretation:  Normal sinus rhythm Low voltage, precordial leads  Nonspecific repol abnormality, diffuse leads Baseline wander in lead(s) V3  V5 V6 No significant change since last tracing Confirmed by Winfred Leeds   MD, Clifford Coudriet [phone]) on 09/14/2014 4:26:11 PM     Chest x-ray viewed by me Results for orders placed or performed during the hospital encounter of 09/14/14  Comprehensive metabolic panel  Result Value Ref Range   Sodium  135 135 - 145 mmol/L   Potassium 4.5 3.5 - 5.1 mmol/L   Chloride 101 101 - 111 mmol/L   CO2 27 22 - 32 mmol/L   Glucose, Bld 187 (H) 65 - 99 mg/dL   BUN 29 (H) 6 - 20 mg/dL   Creatinine, Ser 2.11 (H) 0.44 - 1.00 mg/dL   Calcium 9.3 8.9 - 10.3 mg/dL   Total Protein 6.8 6.5 - 8.1 g/dL   Albumin 3.4 (L) 3.5 - 5.0 g/dL   AST 19 15 - 41 U/L   ALT 10 (L) 14 - 54 U/L   Alkaline Phosphatase 64 38 - 126 U/L   Total Bilirubin 0.5 0.3 - 1.2 mg/dL   GFR calc non Af Amer 21 (L) >60 mL/min   GFR calc Af Amer 24 (L) >60 mL/min   Anion gap 7 5 - 15  CBC  Result Value Ref Range   WBC 5.5 4.0 - 10.5 K/uL   RBC 3.74 (L) 3.87 - 5.11 MIL/uL   Hemoglobin 11.8 (L) 12.0 - 15.0 g/dL   HCT 34.9 (L) 36.0 - 46.0 %   MCV 93.3 78.0 - 100.0 fL   MCH 31.6 26.0 - 34.0 pg   MCHC 33.8 30.0 - 36.0 g/dL   RDW 13.4 11.5 - 15.5 %   Platelets 259 150 - 400 K/uL  Troponin I  Result Value Ref Range   Troponin I <0.03 <0.031 ng/mL  Urinalysis, Routine w reflex microscopic (not at [facility])  Result Value Ref Range   Color, Urine YELLOW YELLOW   APPearance HAZY (A) CLEAR   Specific Gravity, Urine >1.030 (H) 1.005 - 1.030   pH 6.0 5.0 - 8.0   Glucose, UA NEGATIVE NEGATIVE mg/dL   Hgb  urine dipstick LARGE (A) NEGATIVE   Bilirubin Urine NEGATIVE NEGATIVE   Ketones, ur NEGATIVE NEGATIVE mg/dL   Protein, ur TRACE (A) NEGATIVE mg/dL   Urobilinogen, UA 0.2 0.0 - 1.0 mg/dL   Nitrite POSITIVE (A) NEGATIVE   Leukocytes, UA TRACE (A) NEGATIVE  Urine microscopic-add on  Result Value Ref Range   Squamous Epithelial / LPF RARE RARE   WBC, UA 7-10 <3 WBC/hpf   RBC / HPF 11-20 <3 RBC/hpf   Bacteria, UA MANY (A) RARE   Dg Chest 2 View  09/14/2014   CLINICAL DATA:  Altered mental status. Chronic urinary tract infection. Increase confusion.  EXAM: CHEST  2 VIEW  COMPARISON:  07/27/2014.  02/13/2013.  FINDINGS: Cardiopericardial silhouette is mildly enlarged for projection. This is unchanged compared to 7/20 04/2014. Aortic arch atherosclerosis. There is no airspace disease or effusion. Subsegmental atelectasis or scarring is present at the LEFT base. Osteopenia. Monitoring leads project over the chest.  IMPRESSION: Mild cardiomegaly without failure. No acute cardiopulmonary disease.   Electronically Signed   By: Dereck Ligas M.D.   On: 09/14/2014 16:42   Ct Head Wo Contrast  09/14/2014   CLINICAL DATA:  Altered mental status. Increasing confusion. Chronic urinary tract infection.  EXAM: CT HEAD WITHOUT CONTRAST  TECHNIQUE: Contiguous axial images were obtained from the base of the skull through the vertex without intravenous contrast.  COMPARISON:  04/04/2014.  FINDINGS: No mass lesion, mass effect, midline shift, hydrocephalus, hemorrhage. No acute territorial cortical ischemia/infarct. Atrophy and chronic ischemic white matter disease is present.  IMPRESSION: Atrophy and chronic ischemic white matter disease without acute intracranial abnormality.   Electronically Signed   By: Dereck Ligas M.D.   On: 09/14/2014 16:55    MDM  Since  suffering from acute delirium.also has acute kidney injury Also has urinary tract infection. Urine sent for culture. Spoke with Dr.Gorani admit to medical  surgical floor, IV antibiotics Final diagnoses:  None  Dx #1 acute delirium #2 uti #3 hyperglycemia #4 acute kidney injury      Orlie Dakin, MD 09/14/14 1731

## 2014-09-15 ENCOUNTER — Inpatient Hospital Stay (HOSPITAL_COMMUNITY): Payer: Medicare Other

## 2014-09-15 DIAGNOSIS — G9340 Encephalopathy, unspecified: Secondary | ICD-10-CM

## 2014-09-15 LAB — GLUCOSE, CAPILLARY
Glucose-Capillary: 100 mg/dL — ABNORMAL HIGH (ref 65–99)
Glucose-Capillary: 141 mg/dL — ABNORMAL HIGH (ref 65–99)
Glucose-Capillary: 215 mg/dL — ABNORMAL HIGH (ref 65–99)
Glucose-Capillary: 107 mg/dL — ABNORMAL HIGH (ref 65–99)

## 2014-09-15 LAB — CBC
HCT: 30.1 % — ABNORMAL LOW (ref 36.0–46.0)
Hemoglobin: 10.2 g/dL — ABNORMAL LOW (ref 12.0–15.0)
MCH: 31.7 pg (ref 26.0–34.0)
MCHC: 33.9 g/dL (ref 30.0–36.0)
MCV: 93.5 fL (ref 78.0–100.0)
PLATELETS: 211 10*3/uL (ref 150–400)
RBC: 3.22 MIL/uL — AB (ref 3.87–5.11)
RDW: 13.4 % (ref 11.5–15.5)
WBC: 4 10*3/uL (ref 4.0–10.5)

## 2014-09-15 LAB — COMPREHENSIVE METABOLIC PANEL
ALK PHOS: 57 U/L (ref 38–126)
ALT: 10 U/L — AB (ref 14–54)
AST: 18 U/L (ref 15–41)
Albumin: 2.7 g/dL — ABNORMAL LOW (ref 3.5–5.0)
Anion gap: 6 (ref 5–15)
BUN: 30 mg/dL — AB (ref 6–20)
CHLORIDE: 105 mmol/L (ref 101–111)
CO2: 25 mmol/L (ref 22–32)
CREATININE: 1.75 mg/dL — AB (ref 0.44–1.00)
Calcium: 8.5 mg/dL — ABNORMAL LOW (ref 8.9–10.3)
GFR calc Af Amer: 31 mL/min — ABNORMAL LOW (ref >60)
GFR calc non Af Amer: 26 mL/min — ABNORMAL LOW (ref >60)
GLUCOSE: 115 mg/dL — AB (ref 65–99)
Potassium: 4.1 mmol/L (ref 3.5–5.1)
SODIUM: 136 mmol/L (ref 135–145)
Total Bilirubin: 0.5 mg/dL (ref 0.3–1.2)
Total Protein: 5.5 g/dL — ABNORMAL LOW (ref 6.5–8.1)

## 2014-09-15 LAB — HEMOGLOBIN A1C
Hgb A1c MFr Bld: 6.5 % — ABNORMAL HIGH (ref 4.8–5.6)
MEAN PLASMA GLUCOSE: 140 mg/dL

## 2014-09-15 MED ORDER — OMEGA-3-ACID ETHYL ESTERS 1 G PO CAPS
1.0000 g | ORAL_CAPSULE | Freq: Every day | ORAL | Status: DC
  Administered 2014-09-15 – 2014-09-18 (×4): 1 g via ORAL
  Filled 2014-09-15 (×3): qty 1

## 2014-09-15 MED ORDER — LEVOTHYROXINE SODIUM 112 MCG PO TABS
112.0000 ug | ORAL_TABLET | Freq: Every day | ORAL | Status: DC
  Administered 2014-09-15 – 2014-09-18 (×4): 112 ug via ORAL
  Filled 2014-09-15 (×4): qty 1

## 2014-09-15 NOTE — Consult Note (Signed)
Morton A. Merlene Laughter, MD     [email or web address]          KIARAH ECKSTEIN is an 81 y.o. female.   ASSESSMENT/PLAN: 1. Resolving toxic metabolic encephalopathy due to urinary tract infection. 2. Possibly mild cognitive impairment at baseline. 3. Hypothyroidism.  RECOMMENDATION: Agree with current treatment. Check B12 level.  The patient is an 81 year old white female who presents with confusion and altered mental status on last several days. She has been worked up and has been noted to have acute renal failure and the urinary tract infection. She has been treated properly with hydration and IV antibiotics. Her cognition has improved significantly but she is still not at baseline. The patient does not complain of any issues at this time. There is no focal numbness, weakness, dysarthria, dysphagia headaches dyspnea or dysphasia. The review of systems otherwise negative.  GENERAL: Pleasant female in no acute distress.  HEENT: Supple. Atraumatic normocephalic.   ABDOMEN: soft  EXTREMITIES: No edema   BACK: Normal.  SKIN: Normal by inspection.    MENTAL STATUS: Alert and oriented to hospital, she is not oriented to time thinking that it is 1926. She is also not oriented as to why she is in the hospital. There is no dysarthria although there is reduced speech production.  CRANIAL NERVES: Pupils are equal, round and reactive to light and accommodation; extra ocular movements are full, there is no significant nystagmus; visual fields are full; upper and lower facial muscles are normal in strength and symmetric, there is no flattening of the nasolabial folds; tongue is midline; uvula is midline; shoulder elevation is normal.  MOTOR: Normal tone, bulk and strength except deltoids which are 4+/5 ; no pronator drift.  COORDINATION: Left finger to nose is normal, right finger to nose is normal, No rest tremor; no intention tremor; no postural tremor; no  bradykinesia.  REFLEXES: Deep tendon reflexes are symmetrical and normal. Babinski reflexes are flexor bilaterally.   SENSATION: Normal to light touch and the pain.   Blood pressure 122/52, pulse 61, temperature 98.5 F (36.9 C), temperature source Oral, resp. rate 20, height $RemoveBe'5\' 2"'[id number]$  (1.575 m), weight 71.782 kg (158 lb 4 oz), SpO2 100 %.  Past Medical History  Diagnosis Date  . Hypothyroidism   . CAD (coronary artery disease)     NSTEMI 04/2011 with newly diagnosed 3V CAD s/p PCI/DES mLAD 04/11/11 with residual dz (per Dr. Burt Knack, should have consideration of PCI of the occluded RCA based on symptoms and/or consideration of outpatient nuclear perfusion testing after the patient recovers from her infarct)  . Ischemic cardiomyopathy     EF 45% by cath 04/20/11, 50-55% by echo 04/22/11. hypotension limiting medication titration   . Hyperlipidemia   . Hyperglycemia   . Hypertension   . Back pain, chronic   . Myocardial infarction 2013  . UTI (urinary tract infection) 12/13/2013  . Arthritis   . Type 2 diabetes with nephropathy 07/26/2014    Past Surgical History  Procedure Laterality Date  . Breast lumpectomy      right  . Coronary stent placement    . Back surgery    . Left heart catheterization with coronary angiogram N/A 04/20/2011    Procedure: LEFT HEART CATHETERIZATION WITH CORONARY ANGIOGRAM;  Surgeon: Burnell Blanks, MD;  Location: [facility] CATH LAB;  Service: Cardiovascular;  Laterality: N/A;  . Percutaneous coronary stent intervention (pci-s) N/A 04/21/2011    Procedure: PERCUTANEOUS CORONARY STENT INTERVENTION (PCI-S);  Surgeon: Sherren Mocha, MD;  Location: [facility] CATH LAB;  Service: Cardiovascular;  Laterality: N/A;    Family History  Problem Relation Age of Onset  . Other      no known family cardiac disease    Social History:  reports that she has never smoked. She has never used smokeless tobacco. She reports that she does not drink alcohol or use illicit  drugs.  Allergies:  Allergies  Allergen Reactions  . Ciprofloxacin Other (See Comments)    Hallucination  . Lipitor [Atorvastatin] Other (See Comments)    Muscle Weakness  . Penicillins Rash    Medications: Prior to Admission medications   Medication Sig Start Date End Date Taking? Authorizing Provider  amLODipine (NORVASC) 10 MG tablet Take 1 tablet (10 mg total) by mouth daily. Patient taking differently: Take 5 mg by mouth daily.  08/02/14  Yes Kathie Dike, MD  aspirin 81 MG chewable tablet Chew 81 mg by mouth daily.    Yes Historical Provider, MD  doxycycline (VIBRA-TABS) 100 MG tablet Take 100 mg by mouth 2 (two) times daily.   Yes Historical Provider, MD  fish oil-omega-3 fatty acids 1000 MG capsule Take 1 g by mouth daily.   Yes Historical Provider, MD  levothyroxine (SYNTHROID, LEVOTHROID) 112 MCG tablet Take 1 tablet (112 mcg total) by mouth every morning. 12/16/13  Yes Verlee Monte, MD  meclizine (ANTIVERT) 25 MG tablet Take 25 mg by mouth 4 (four) times daily as needed. Dizziness   Yes Historical Provider, MD  metoprolol tartrate (LOPRESSOR) 25 MG tablet Take 25 mg by mouth 2 (two) times daily.  04/23/11  Yes Dayna N Dunn, PA-C  ondansetron (ZOFRAN-ODT) 4 MG disintegrating tablet Take 4 mg by mouth every 8 (eight) hours as needed for nausea or vomiting.   Yes Historical Provider, MD  RANITIDINE HCL PO Take 1 tablet by mouth 2 (two) times daily.   Yes Historical Provider, MD  traMADol (ULTRAM) 50 MG tablet Take 50 mg by mouth 2 (two) times daily.  04/26/14  Yes Historical Provider, MD  demeclocycline (DECLOMYCIN) 150 MG tablet Take 1 tablet (150 mg total) by mouth every 12 (twelve) hours. 08/02/14   Kathie Dike, MD  nitroGLYCERIN (NITROSTAT) 0.4 MG SL tablet Place 0.4 mg under the tongue every 5 (five) minutes x 3 doses as needed. For chest pain. 04/23/11   Dayna N Dunn, PA-C  Vitamin D, Ergocalciferol, (DRISDOL) 50000 UNITS CAPS Take 50,000 Units by mouth 2 (two) times a week.  Tuesdays and Thurdays 05/30/12   Historical Provider, MD    Scheduled Meds: . amLODipine  5 mg Oral Daily  . aspirin  81 mg Oral Daily  . cefTRIAXone (ROCEPHIN)  IV  1 g Intravenous Q24H  . enoxaparin (LOVENOX) injection  30 mg Subcutaneous Q24H  . insulin aspart  0-5 Units Subcutaneous QHS  . insulin aspart  0-9 Units Subcutaneous TID WC  . levothyroxine  112 mcg Oral QAC breakfast  . metoprolol tartrate  25 mg Oral BID  . omega-3 acid ethyl esters  1 g Oral Daily  . traMADol  50 mg Oral BID  . Vitamin D (Ergocalciferol)  50,000 Units Oral Once per day on Mon Thu   Continuous Infusions: . sodium chloride 75 mL/hr at 09/15/14 0845   PRN Meds:.meclizine, nitroGLYCERIN, ondansetron **OR** ondansetron (ZOFRAN) IV, ondansetron     Results for orders placed or performed during the hospital encounter of 09/14/14 (from the past 48 hour(s))  Comprehensive metabolic panel     Status: Abnormal  Collection Time: 09/14/14  1:05 PM  Result Value Ref Range   Sodium 135 135 - 145 mmol/L   Potassium 4.5 3.5 - 5.1 mmol/L   Chloride 101 101 - 111 mmol/L   CO2 27 22 - 32 mmol/L   Glucose, Bld 187 (H) 65 - 99 mg/dL   BUN 29 (H) 6 - 20 mg/dL   Creatinine, Ser 2.11 (H) 0.44 - 1.00 mg/dL   Calcium 9.3 8.9 - 10.3 mg/dL   Total Protein 6.8 6.5 - 8.1 g/dL   Albumin 3.4 (L) 3.5 - 5.0 g/dL   AST 19 15 - 41 U/L   ALT 10 (L) 14 - 54 U/L   Alkaline Phosphatase 64 38 - 126 U/L   Total Bilirubin 0.5 0.3 - 1.2 mg/dL   GFR calc non Af Amer 21 (L) >60 mL/min   GFR calc Af Amer 24 (L) >60 mL/min    Comment: (NOTE) The eGFR has been calculated using the CKD EPI equation. This calculation has not been validated in all clinical situations. eGFR's persistently <60 mL/min signify possible Chronic Kidney Disease.    Anion gap 7 5 - 15  CBC     Status: Abnormal   Collection Time: 09/14/14  1:05 PM  Result Value Ref Range   WBC 5.5 4.0 - 10.5 K/uL   RBC 3.74 (L) 3.87 - 5.11 MIL/uL   Hemoglobin 11.8 (L)  12.0 - 15.0 g/dL   HCT 34.9 (L) 36.0 - 46.0 %   MCV 93.3 78.0 - 100.0 fL   MCH 31.6 26.0 - 34.0 pg   MCHC 33.8 30.0 - 36.0 g/dL   RDW 13.4 11.5 - 15.5 %   Platelets 259 150 - 400 K/uL  Troponin I     Status: None   Collection Time: 09/14/14  1:05 PM  Result Value Ref Range   Troponin I <0.03 <0.031 ng/mL    Comment:        NO INDICATION OF MYOCARDIAL INJURY.   TSH     Status: Abnormal   Collection Time: 09/14/14  1:05 PM  Result Value Ref Range   TSH 10.201 (H) 0.350 - 4.500 uIU/mL  Hemoglobin A1c     Status: Abnormal   Collection Time: 09/14/14  1:05 PM  Result Value Ref Range   Hgb A1c MFr Bld 6.5 (H) 4.8 - 5.6 %    Comment: (NOTE)         Pre-diabetes: 5.7 - 6.4         Diabetes: >6.4         Glycemic control for adults with diabetes: <7.0    Mean Plasma Glucose 140 mg/dL    Comment: (NOTE) Performed At: [facility] [place], [place] [id number] Lindon Romp MD [id number]   Urinalysis, Routine w reflex microscopic (not at [facility])     Status: Abnormal   Collection Time: 09/14/14  4:00 PM  Result Value Ref Range   Color, Urine YELLOW YELLOW   APPearance HAZY (A) CLEAR   Specific Gravity, Urine >1.030 (H) 1.005 - 1.030   pH 6.0 5.0 - 8.0   Glucose, UA NEGATIVE NEGATIVE mg/dL   Hgb urine dipstick LARGE (A) NEGATIVE   Bilirubin Urine NEGATIVE NEGATIVE   Ketones, ur NEGATIVE NEGATIVE mg/dL   Protein, ur TRACE (A) NEGATIVE mg/dL   Urobilinogen, UA 0.2 0.0 - 1.0 mg/dL   Nitrite POSITIVE (A) NEGATIVE   Leukocytes, UA TRACE (A) NEGATIVE  Urine microscopic-add on     Status:  Abnormal   Collection Time: 09/14/14  4:00 PM  Result Value Ref Range   Squamous Epithelial / LPF RARE RARE   WBC, UA 7-10 <3 WBC/hpf   RBC / HPF 11-20 <3 RBC/hpf   Bacteria, UA MANY (A) RARE  Urine culture     Status: None (Preliminary result)   Collection Time: 09/14/14  4:00 PM  Result Value Ref Range   Specimen Description URINE, CATHETERIZED    Special Requests  Normal    Culture      >=100,000 COLONIES/mL GRAM NEGATIVE RODS CULTURE REINCUBATED FOR BETTER GROWTH Performed at [facility]    Report Status PENDING   Glucose, capillary     Status: Abnormal   Collection Time: 09/14/14 10:05 PM  Result Value Ref Range   Glucose-Capillary 162 (H) 65 - 99 mg/dL  Comprehensive metabolic panel     Status: Abnormal   Collection Time: 09/15/14  6:05 AM  Result Value Ref Range   Sodium 136 135 - 145 mmol/L   Potassium 4.1 3.5 - 5.1 mmol/L   Chloride 105 101 - 111 mmol/L   CO2 25 22 - 32 mmol/L   Glucose, Bld 115 (H) 65 - 99 mg/dL   BUN 30 (H) 6 - 20 mg/dL   Creatinine, Ser 1.75 (H) 0.44 - 1.00 mg/dL   Calcium 8.5 (L) 8.9 - 10.3 mg/dL   Total Protein 5.5 (L) 6.5 - 8.1 g/dL   Albumin 2.7 (L) 3.5 - 5.0 g/dL   AST 18 15 - 41 U/L   ALT 10 (L) 14 - 54 U/L   Alkaline Phosphatase 57 38 - 126 U/L   Total Bilirubin 0.5 0.3 - 1.2 mg/dL   GFR calc non Af Amer 26 (L) >60 mL/min   GFR calc Af Amer 31 (L) >60 mL/min    Comment: (NOTE) The eGFR has been calculated using the CKD EPI equation. This calculation has not been validated in all clinical situations. eGFR's persistently <60 mL/min signify possible Chronic Kidney Disease.    Anion gap 6 5 - 15  CBC     Status: Abnormal   Collection Time: 09/15/14  6:05 AM  Result Value Ref Range   WBC 4.0 4.0 - 10.5 K/uL   RBC 3.22 (L) 3.87 - 5.11 MIL/uL   Hemoglobin 10.2 (L) 12.0 - 15.0 g/dL   HCT 30.1 (L) 36.0 - 46.0 %   MCV 93.5 78.0 - 100.0 fL   MCH 31.7 26.0 - 34.0 pg   MCHC 33.9 30.0 - 36.0 g/dL   RDW 13.4 11.5 - 15.5 %   Platelets 211 150 - 400 K/uL  Glucose, capillary     Status: Abnormal   Collection Time: 09/15/14  7:28 AM  Result Value Ref Range   Glucose-Capillary 100 (H) 65 - 99 mg/dL   Comment 1 Notify RN   Glucose, capillary     Status: Abnormal   Collection Time: 09/15/14 11:19 AM  Result Value Ref Range   Glucose-Capillary 141 (H) 65 - 99 mg/dL   Comment 1 Notify RN   Glucose,  capillary     Status: Abnormal   Collection Time: 09/15/14  4:40 PM  Result Value Ref Range   Glucose-Capillary 215 (H) 65 - 99 mg/dL    Studies/Results:  BRAIN MRI  No acute or reversible finding. Brain atrophy and moderate chronic small-vessel ischemic changes of the cerebral hemispheric white matter.    Yamna Mackel A. Merlene Laughter, M.D.  Diplomate, Tax adviser of Psychiatry and Neurology ( Neurology).  09/15/2014, 6:53 PM

## 2014-09-15 NOTE — Progress Notes (Signed)
[facility] PROGRESS NOTE  Joann Hunter JKK:[id number] DOB: 12-Aug-1932 DOA: 09/14/2014 PCP: Monico Blitz, MD  Assessment/Plan: 1. Acute Encephalopathy likely related to UTI. CT head and MRI brain unremarkable. Appears to be improving. 2. UTI. Continue IV antibiotics. Urine cultures in process. H/o ESBL in past.  3. Acute kidney injury on chronic kidney disease stage III. Likely related to dehydration. Creatinine is improving. Continue IV fluids. 4. DM, type 2. This appears to be diet controlled. Continue SSI. 5. Hypertension. Stable.  Code Status: Full DVT Prophylaxis: Lovenox Family discussion: Dicussed plan in detail. No further concerns at this time. Disposition Plan: Discharge within 24 hours   Consultants:    Procedures:    Antibiotics:  Rocephin 9/11>>  HPI/Subjective: No SOB, cough, nausea or vomiting.  Objective: Filed Vitals:   09/15/14 0534  BP: 126/46  Pulse: 57  Temp: 98.5 F (36.9 C)  Resp: 20    Intake/Output Summary (Last 24 hours) at 09/15/14 0711 Last data filed at 09/14/14 1730  Gross per 24 hour  Intake    120 ml  Output      0 ml  Net    120 ml   Filed Weights   09/14/14 1830  Weight: 71.782 kg (158 lb 4 oz)    Exam:  General:  Appears calm and comfortable.  Cardiovascular: Regular rate and rhythm, no murmur, rub or gallop. No lower extremity edema. Respiratory: Clear to auscultation bilaterally, no wheezes, rales or rhonchi. Normal respiratory effort. Abdomen: soft, ntnd Musculoskeletal: grossly normal tone bilateral upper and lower extremities Psychiatric: grossly normal mood and affect, speech fluent and appropriate Neurologic: grossly non-focal.   Data Reviewed: Basic Metabolic Panel:  Recent Labs Lab 09/14/14 1305  NA 135  K 4.5  CL 101  CO2 27  GLUCOSE 187*  BUN 29*  CREATININE 2.11*  CALCIUM 9.3   Liver Function Tests:  Recent Labs Lab 09/14/14 1305  AST 19  ALT 10*  ALKPHOS 64  BILITOT 0.5   PROT 6.8  ALBUMIN 3.4*   CBC:  Recent Labs Lab 09/14/14 1305  WBC 5.5  HGB 11.8*  HCT 34.9*  MCV 93.3  PLT 259   Cardiac Enzymes:  Recent Labs Lab 09/14/14 1305  TROPONINI <0.03   BNP (last 3 results)  Recent Labs  07/26/14 0557  BNP 155.0*    ProBNP (last 3 results)  Recent Labs  12/15/13 1245  PROBNP 2030.0*    CBG:  Recent Labs Lab 09/14/14 2205  GLUCAP 162*      Studies: Dg Chest 2 View  09/14/2014   CLINICAL DATA:  Altered mental status. Chronic urinary tract infection. Increase confusion.  EXAM: CHEST  2 VIEW  COMPARISON:  07/27/2014.  02/13/2013.  FINDINGS: Cardiopericardial silhouette is mildly enlarged for projection. This is unchanged compared to 7/20 04/2014. Aortic arch atherosclerosis. There is no airspace disease or effusion. Subsegmental atelectasis or scarring is present at the LEFT base. Osteopenia. Monitoring leads project over the chest.  IMPRESSION: Mild cardiomegaly without failure. No acute cardiopulmonary disease.   Electronically Signed   By: Dereck Ligas M.D.   On: 09/14/2014 16:42   Ct Head Wo Contrast  09/14/2014   CLINICAL DATA:  Altered mental status. Increasing confusion. Chronic urinary tract infection.  EXAM: CT HEAD WITHOUT CONTRAST  TECHNIQUE: Contiguous axial images were obtained from the base of the skull through the vertex without intravenous contrast.  COMPARISON:  04/04/2014.  FINDINGS: No mass lesion, mass effect, midline shift, hydrocephalus, hemorrhage. No acute territorial cortical  ischemia/infarct. Atrophy and chronic ischemic white matter disease is present.  IMPRESSION: Atrophy and chronic ischemic white matter disease without acute intracranial abnormality.   Electronically Signed   By: Dereck Ligas M.D.   On: 09/14/2014 16:55    Scheduled Meds: . amLODipine  5 mg Oral Daily  . aspirin  81 mg Oral Daily  . cefTRIAXone (ROCEPHIN)  IV  1 g Intravenous Q24H  . enoxaparin (LOVENOX) injection  30 mg  Subcutaneous Q24H  . fish oil-omega-3 fatty acids  1 g Oral Daily  . insulin aspart  0-5 Units Subcutaneous QHS  . insulin aspart  0-9 Units Subcutaneous TID WC  . levothyroxine  112 mcg Oral q morning - 10a  . metoprolol tartrate  25 mg Oral BID  . traMADol  50 mg Oral BID  . Vitamin D (Ergocalciferol)  50,000 Units Oral Once per day on Mon Thu   Continuous Infusions: . sodium chloride 75 mL/hr at 09/14/14 1831    Active Problems:   AKI (acute kidney injury)   UTI (lower urinary tract infection)   CKD (chronic kidney disease), stage III   Type 2 diabetes with nephropathy   Essential hypertension   Acute delirium   Altered mental status    Time spent: 35 minutes  By signing my name below, I, Rhett Bannister attest that this documentation has been prepared under the direction and in the presence of Dr. Kathie Dike, M.D.   Electronically signed: Rhett Bannister  09/15/2014   Kathie Dike, M.D.  [facility] Pager [phone]. If 7PM-7AM, please contact night-coverage at [email or web address], password [facility] 09/15/2014, 7:11 AM  LOS: 1 day

## 2014-09-16 DIAGNOSIS — R410 Disorientation, unspecified: Secondary | ICD-10-CM

## 2014-09-16 LAB — BASIC METABOLIC PANEL
Anion gap: 6 (ref 5–15)
BUN: 26 mg/dL — AB (ref 6–20)
CHLORIDE: 105 mmol/L (ref 101–111)
CO2: 22 mmol/L (ref 22–32)
CREATININE: 1.52 mg/dL — AB (ref 0.44–1.00)
Calcium: 8.3 mg/dL — ABNORMAL LOW (ref 8.9–10.3)
GFR calc Af Amer: 36 mL/min — ABNORMAL LOW (ref >60)
GFR calc non Af Amer: 31 mL/min — ABNORMAL LOW (ref >60)
Glucose, Bld: 118 mg/dL — ABNORMAL HIGH (ref 65–99)
POTASSIUM: 4.3 mmol/L (ref 3.5–5.1)
Sodium: 133 mmol/L — ABNORMAL LOW (ref 135–145)

## 2014-09-16 LAB — GLUCOSE, CAPILLARY
GLUCOSE-CAPILLARY: 117 mg/dL — AB (ref 65–99)
GLUCOSE-CAPILLARY: 186 mg/dL — AB (ref 65–99)
Glucose-Capillary: 137 mg/dL — ABNORMAL HIGH (ref 65–99)
Glucose-Capillary: 119 mg/dL — ABNORMAL HIGH (ref 65–99)

## 2014-09-16 LAB — VITAMIN B12: Vitamin B-12: 161 pg/mL — ABNORMAL LOW (ref 180–914)

## 2014-09-16 MED ORDER — CYANOCOBALAMIN 1000 MCG/ML IJ SOLN: 1000.0000 ug | INTRAMUSCULAR | Status: AC

## 2014-09-16 NOTE — Progress Notes (Signed)
Patient ID: ADDLEY BALLINGER, female   DOB: 1932-09-06, 81 y.o.   MRN: [id number]  Cheney A. Merlene Laughter, MD     [email or web address]          ANADIA HELMES is an 81 y.o. female.   Assessment/Plan: 1. Resolving toxic metabolic encephalopathy due to urinary tract infection. 2. Possibly mild cognitive impairment at baseline. 3. Hypothyroidism. 4. Vitamin B12 def  RECOMMENDATION: Agree with current treatment. Replace vit B12  Doing well. Family at bedside. At baseline cognition.   GENERAL: Pleasant female in no acute distress.  HEENT: Supple. Atraumatic normocephalic.   ABDOMEN: soft  EXTREMITIES: No edema   BACK: Normal.  SKIN: Normal by inspection.   MENTAL STATUS: Alert and oriented to hospital. She is also not oriented as to why she is in the hospital. There is no dysarthria although there is reduced speech production.  CRANIAL NERVES: Pupils are equal, round and reactive to light and accommodation; extra ocular movements are full, there is no significant nystagmus; visual fields are full; upper and lower facial muscles are normal in strength and symmetric, there is no flattening of the nasolabial folds; tongue is midline; uvula is midline; shoulder elevation is normal.  MOTOR: Normal tone, bulk and strength except deltoids which are 4+/5 ; no pronator drift.   Objective: Vital signs in last 24 hours: Temp:  [98 F (36.7 C)-98.2 F (36.8 C)] 98 F (36.7 C) (09/13 1438) Pulse Rate:  [57-64] 57 (09/13 1438) Resp:  [20] 20 (09/13 1438) BP: (116-145)/(50-69) 132/50 mmHg (09/13 1438) SpO2:  [97 %-99 %] 99 % (09/13 1438)  Intake/Output from previous day: 09/12 0701 - 09/13 0700 In: 3506.3 [P.O.:720; I.V.:2736.3; IV Piggyback:50] Out: 700 [Urine:700] Intake/Output this shift: Total I/O In: 470 [P.O.:470] Out: -  Nutritional status: Diet Carb Modified Fluid consistency:: Thin; Room service appropriate?: Yes   Lab Results: Results for  orders placed or performed during the hospital encounter of 09/14/14 (from the past 48 hour(s))  Glucose, capillary     Status: Abnormal   Collection Time: 09/14/14 10:05 PM  Result Value Ref Range   Glucose-Capillary 162 (H) 65 - 99 mg/dL  Comprehensive metabolic panel     Status: Abnormal   Collection Time: 09/15/14  6:05 AM  Result Value Ref Range   Sodium 136 135 - 145 mmol/L   Potassium 4.1 3.5 - 5.1 mmol/L   Chloride 105 101 - 111 mmol/L   CO2 25 22 - 32 mmol/L   Glucose, Bld 115 (H) 65 - 99 mg/dL   BUN 30 (H) 6 - 20 mg/dL   Creatinine, Ser 1.75 (H) 0.44 - 1.00 mg/dL   Calcium 8.5 (L) 8.9 - 10.3 mg/dL   Total Protein 5.5 (L) 6.5 - 8.1 g/dL   Albumin 2.7 (L) 3.5 - 5.0 g/dL   AST 18 15 - 41 U/L   ALT 10 (L) 14 - 54 U/L   Alkaline Phosphatase 57 38 - 126 U/L   Total Bilirubin 0.5 0.3 - 1.2 mg/dL   GFR calc non Af Amer 26 (L) >60 mL/min   GFR calc Af Amer 31 (L) >60 mL/min    Comment: (NOTE) The eGFR has been calculated using the CKD EPI equation. This calculation has not been validated in all clinical situations. eGFR's persistently <60 mL/min signify possible Chronic Kidney Disease.    Anion gap 6 5 - 15  CBC     Status: Abnormal   Collection Time: 09/15/14  6:05 AM  Result Value  Ref Range   WBC 4.0 4.0 - 10.5 K/uL   RBC 3.22 (L) 3.87 - 5.11 MIL/uL   Hemoglobin 10.2 (L) 12.0 - 15.0 g/dL   HCT 30.1 (L) 36.0 - 46.0 %   MCV 93.5 78.0 - 100.0 fL   MCH 31.7 26.0 - 34.0 pg   MCHC 33.9 30.0 - 36.0 g/dL   RDW 13.4 11.5 - 15.5 %   Platelets 211 150 - 400 K/uL  Glucose, capillary     Status: Abnormal   Collection Time: 09/15/14  7:28 AM  Result Value Ref Range   Glucose-Capillary 100 (H) 65 - 99 mg/dL   Comment 1 Notify RN   Glucose, capillary     Status: Abnormal   Collection Time: 09/15/14 11:19 AM  Result Value Ref Range   Glucose-Capillary 141 (H) 65 - 99 mg/dL   Comment 1 Notify RN   Glucose, capillary     Status: Abnormal   Collection Time: 09/15/14  4:40 PM    Result Value Ref Range   Glucose-Capillary 215 (H) 65 - 99 mg/dL  Glucose, capillary     Status: Abnormal   Collection Time: 09/15/14  8:36 PM  Result Value Ref Range   Glucose-Capillary 107 (H) 65 - 99 mg/dL   Comment 1 Notify RN    Comment 2 Document in Chart   Basic metabolic panel     Status: Abnormal   Collection Time: 09/16/14  6:24 AM  Result Value Ref Range   Sodium 133 (L) 135 - 145 mmol/L   Potassium 4.3 3.5 - 5.1 mmol/L   Chloride 105 101 - 111 mmol/L   CO2 22 22 - 32 mmol/L   Glucose, Bld 118 (H) 65 - 99 mg/dL   BUN 26 (H) 6 - 20 mg/dL   Creatinine, Ser 1.52 (H) 0.44 - 1.00 mg/dL   Calcium 8.3 (L) 8.9 - 10.3 mg/dL   GFR calc non Af Amer 31 (L) >60 mL/min   GFR calc Af Amer 36 (L) >60 mL/min    Comment: (NOTE) The eGFR has been calculated using the CKD EPI equation. This calculation has not been validated in all clinical situations. eGFR's persistently <60 mL/min signify possible Chronic Kidney Disease.    Anion gap 6 5 - 15  Vitamin B12     Status: Abnormal   Collection Time: 09/16/14  6:24 AM  Result Value Ref Range   Vitamin B-12 161 (L) 180 - 914 pg/mL    Comment: (NOTE) This assay is not validated for testing neonatal or myeloproliferative syndrome specimens for Vitamin B12 levels. Performed at [facility]   Glucose, capillary     Status: Abnormal   Collection Time: 09/16/14  7:30 AM  Result Value Ref Range   Glucose-Capillary 117 (H) 65 - 99 mg/dL  Glucose, capillary     Status: Abnormal   Collection Time: 09/16/14 11:18 AM  Result Value Ref Range   Glucose-Capillary 137 (H) 65 - 99 mg/dL  Glucose, capillary     Status: Abnormal   Collection Time: 09/16/14  4:27 PM  Result Value Ref Range   Glucose-Capillary 119 (H) 65 - 99 mg/dL    Lipid Panel No results for input(s): CHOL, TRIG, HDL, CHOLHDL, VLDL, LDLCALC in the last 72 hours.  Studies/Results:   Medications:  Scheduled Meds: . amLODipine  5 mg Oral Daily  . aspirin  81 mg Oral  Daily  . cefTRIAXone (ROCEPHIN)  IV  1 g Intravenous Q24H  . enoxaparin (LOVENOX) injection  30 mg Subcutaneous Q24H  . insulin aspart  0-5 Units Subcutaneous QHS  . insulin aspart  0-9 Units Subcutaneous TID WC  . levothyroxine  112 mcg Oral QAC breakfast  . metoprolol tartrate  25 mg Oral BID  . omega-3 acid ethyl esters  1 g Oral Daily  . traMADol  50 mg Oral BID  . Vitamin D (Ergocalciferol)  50,000 Units Oral Once per day on Mon Thu   Continuous Infusions: . sodium chloride 75 mL/hr at 09/16/14 1200   PRN Meds:.meclizine, nitroGLYCERIN, ondansetron **OR** ondansetron (ZOFRAN) IV, ondansetron     LOS: 2 days   Kassem Kibbe A. Merlene Laughter, M.D.  Diplomate, Tax adviser of Psychiatry and Neurology ( Neurology).

## 2014-09-16 NOTE — Progress Notes (Signed)
[facility] PROGRESS NOTE  WAVE CALZADA ZOX:[id number] DOB: Feb 18, 1932 DOA: 09/14/2014 PCP: Monico Blitz, MD  Summary: 19 yof presented with acute encephalopathy likely due to a UTI. Urine culture reveals gram negative rods however final result is still pending. Patient started on IV Rocephin. Anticipate discharge once final culture results are available.  Assessment/Plan: 1. Acute Encephalopathy likely related to UTI. CT head and MRI brain unremarkable. Appears to be improving. Neurology consulted and agrees with plan. 2. UTI. Continue IV antibiotics. Urine culture reveals gram negative rods.  H/o ESBL in past. Follow up further culture identification sensitivities. 3. Gram negative rods. Final results still pending. Continue Rocephin. 4. Acute kidney injury on chronic kidney disease stage III. Likely related to dehydration. Creatinine continues to improve. Will continue IV fluids. 5. DM, type 2. This appears to be diet controlled. Continue SSI. 6. Hypertension. Stable.  Code Status: Full DVT Prophylaxis: Lovenox Family discussion: Discussed with patient who understands and has no concerns at this time. Disposition Plan: Anticipate discharge within 24 hours.   Consultants:  Neurology- Phillips Odor, MD  Procedures:    Antibiotics:  Rocephin 9/11>>  HPI/Subjective: Feels better. No reports of chest pain, shortness of breath, n/v/d, or abd pain.   Objective: Filed Vitals:   09/16/14 0442  BP: 145/69  Pulse: 59  Temp: 98.1 F (36.7 C)  Resp: 20    Intake/Output Summary (Last 24 hours) at 09/16/14 1351 Last data filed at 09/16/14 1316  Gross per 24 hour  Intake 3496.25 ml  Output    300 ml  Net 3196.25 ml   Filed Weights   09/14/14 1830  Weight: 71.782 kg (158 lb 4 oz)    Exam: General: NAD, looks comfortable Cardiovascular: RRR, S1, S2  Respiratory: clear bilaterally, No wheezing, rales or rhnochi Abdomen: soft, non tender, no distention , bowel  sounds normal Musculoskeletal: No edema b/l    Data Reviewed: Basic Metabolic Panel:  Recent Labs Lab 09/14/14 1305 09/15/14 0605 09/16/14 0624  NA 135 136 133*  K 4.5 4.1 4.3  CL 101 105 105  CO2 27 25 22   GLUCOSE 187* 115* 118*  BUN 29* 30* 26*  CREATININE 2.11* 1.75* 1.52*  CALCIUM 9.3 8.5* 8.3*   Liver Function Tests:  Recent Labs Lab 09/14/14 1305 09/15/14 0605  AST 19 18  ALT 10* 10*  ALKPHOS 64 57  BILITOT 0.5 0.5  PROT 6.8 5.5*  ALBUMIN 3.4* 2.7*   CBC:  Recent Labs Lab 09/14/14 1305 09/15/14 0605  WBC 5.5 4.0  HGB 11.8* 10.2*  HCT 34.9* 30.1*  MCV 93.3 93.5  PLT 259 211   Cardiac Enzymes:  Recent Labs Lab 09/14/14 1305  TROPONINI <0.03   BNP (last 3 results)  Recent Labs  07/26/14 0557  BNP 155.0*    ProBNP (last 3 results)  Recent Labs  12/15/13 1245  PROBNP 2030.0*    CBG:  Recent Labs Lab 09/15/14 1119 09/15/14 1640 09/15/14 2036 09/16/14 0730 09/16/14 1118  GLUCAP 141* 215* 107* 117* 137*      Studies: Dg Chest 2 View  09/14/2014   CLINICAL DATA:  Altered mental status. Chronic urinary tract infection. Increase confusion.  EXAM: CHEST  2 VIEW  COMPARISON:  07/27/2014.  02/13/2013.  FINDINGS: Cardiopericardial silhouette is mildly enlarged for projection. This is unchanged compared to 7/20 04/2014. Aortic arch atherosclerosis. There is no airspace disease or effusion. Subsegmental atelectasis or scarring is present at the LEFT base. Osteopenia. Monitoring leads project over the chest.  IMPRESSION:  Mild cardiomegaly without failure. No acute cardiopulmonary disease.   Electronically Signed   By: Dereck Ligas M.D.   On: 09/14/2014 16:42   Ct Head Wo Contrast  09/14/2014   CLINICAL DATA:  Altered mental status. Increasing confusion. Chronic urinary tract infection.  EXAM: CT HEAD WITHOUT CONTRAST  TECHNIQUE: Contiguous axial images were obtained from the base of the skull through the vertex without intravenous  contrast.  COMPARISON:  04/04/2014.  FINDINGS: No mass lesion, mass effect, midline shift, hydrocephalus, hemorrhage. No acute territorial cortical ischemia/infarct. Atrophy and chronic ischemic white matter disease is present.  IMPRESSION: Atrophy and chronic ischemic white matter disease without acute intracranial abnormality.   Electronically Signed   By: Dereck Ligas M.D.   On: 09/14/2014 16:55   Mr Brain Wo Contrast  09/15/2014   CLINICAL DATA:  Altered mental status of 4 days duration. Associated generalized weakness.  EXAM: MRI HEAD WITHOUT CONTRAST  TECHNIQUE: Multiplanar, multiecho pulse sequences of the brain and surrounding structures were obtained without intravenous contrast.  COMPARISON:  CT 09/14/2014  FINDINGS: Diffusion imaging does not show any acute or subacute infarction. The brainstem is normal. No cerebellar abnormality. The cerebral hemispheres show generalized atrophy with chronic small-vessel ischemic changes affecting the deep white matter. No cortical or large vessel territory infarction. No mass lesion, hemorrhage, hydrocephalus or extra-axial collection. No pituitary mass. No inflammatory sinus disease. No skull or skullbase lesion.  IMPRESSION: No acute or reversible finding. Brain atrophy and moderate chronic small-vessel ischemic changes of the cerebral hemispheric white matter.   Electronically Signed   By: Nelson Chimes M.D.   On: 09/15/2014 08:49    Scheduled Meds: . amLODipine  5 mg Oral Daily  . aspirin  81 mg Oral Daily  . cefTRIAXone (ROCEPHIN)  IV  1 g Intravenous Q24H  . enoxaparin (LOVENOX) injection  30 mg Subcutaneous Q24H  . insulin aspart  0-5 Units Subcutaneous QHS  . insulin aspart  0-9 Units Subcutaneous TID WC  . levothyroxine  112 mcg Oral QAC breakfast  . metoprolol tartrate  25 mg Oral BID  . omega-3 acid ethyl esters  1 g Oral Daily  . traMADol  50 mg Oral BID  . Vitamin D (Ergocalciferol)  50,000 Units Oral Once per day on Mon Thu    Continuous Infusions: . sodium chloride 75 mL/hr at 09/16/14 1200    Active Problems:   AKI (acute kidney injury)   UTI (lower urinary tract infection)   CKD (chronic kidney disease), stage III   Type 2 diabetes with nephropathy   Essential hypertension   Acute delirium   Altered mental status    Time spent: 25 minutes   By signing my name below, I, Rennis Harding, attest that this documentation has been prepared under the direction and in the presence of Kathie Dike, MD. Electronically signed: Rennis Harding, Scribe. 09/16/2014     Kathie Dike, M.D.  [facility] Pager [phone]. If 7PM-7AM, please contact night-coverage at [email or web address], password [facility] 09/16/2014, 1:51 PM  LOS: 2 days    I have reviewed the above documentation for accuracy and completeness, and I agree with the above.  Virgie Chery

## 2014-09-16 NOTE — Care Management Note (Signed)
Case Management Note  Patient Details  Name: Joann Hunter MRN: [id number] Date of Birth: 1932/02/16  Expected Discharge Date:  09/18/14               Expected Discharge Plan:  Home/Self Care  In-House Referral:  NA  Discharge planning Services  CM Consult  Post Acute Care Choice:  NA Choice offered to:  NA  DME Arranged:    DME Agency:     HH Arranged:    [place] Agency:     Status of Service:  Completed, signed off  Medicare Important Message Given:    Date Medicare IM Given:    Medicare IM give by:    Date Additional Medicare IM Given:    Additional Medicare Important Message give by:     If discussed at [place] of Stay Meetings, dates discussed:    Additional Comments: Pt admitted with AMS. Pt is from home, lives with her husband who is currently in SNF for rehab, and a grandson. Pt is ind at baseline with ADL's. Pt has no HH services or DME's prior to admission. Pt feels she is at her baseline functionally. No CM needs anticipated.  Sherald Barge, RN 09/16/2014, 2:30 PM

## 2014-09-17 DIAGNOSIS — N179 Acute kidney failure, unspecified: Principal | ICD-10-CM

## 2014-09-17 DIAGNOSIS — G9341 Metabolic encephalopathy: Secondary | ICD-10-CM

## 2014-09-17 DIAGNOSIS — E1121 Type 2 diabetes mellitus with diabetic nephropathy: Secondary | ICD-10-CM

## 2014-09-17 DIAGNOSIS — N390 Urinary tract infection, site not specified: Secondary | ICD-10-CM

## 2014-09-17 LAB — BASIC METABOLIC PANEL
ANION GAP: 8 (ref 5–15)
BUN: 20 mg/dL (ref 6–20)
CALCIUM: 8.3 mg/dL — AB (ref 8.9–10.3)
CHLORIDE: 106 mmol/L (ref 101–111)
CO2: 21 mmol/L — AB (ref 22–32)
CREATININE: 1.56 mg/dL — AB (ref 0.44–1.00)
GFR calc non Af Amer: 30 mL/min — ABNORMAL LOW (ref >60)
GFR, EST AFRICAN AMERICAN: 35 mL/min — AB (ref >60)
Glucose, Bld: 138 mg/dL — ABNORMAL HIGH (ref 65–99)
Potassium: 3.9 mmol/L (ref 3.5–5.1)
SODIUM: 135 mmol/L (ref 135–145)

## 2014-09-17 LAB — GLUCOSE, CAPILLARY
GLUCOSE-CAPILLARY: 127 mg/dL — AB (ref 65–99)
GLUCOSE-CAPILLARY: 115 mg/dL — AB (ref 65–99)
GLUCOSE-CAPILLARY: 167 mg/dL — AB (ref 65–99)
Glucose-Capillary: 149 mg/dL — ABNORMAL HIGH (ref 65–99)

## 2014-09-17 LAB — HOMOCYSTEINE: Homocysteine: 20.2 umol/L — ABNORMAL HIGH (ref 0.0–15.0)

## 2014-09-17 NOTE — Progress Notes (Signed)
PROGRESS NOTE  Joann Hunter AJO:[id number] DOB: [birth year] DOA: 09/14/2014 PCP: Monico Blitz, MD  Summary: 81 yo female with h/o CAD, MI, ischemic cardiomyopathy, DM type 2, HLD, HTN, hypergylcemia, hypothyroidism and UTI presenting to the ED with altered mental status.   Assessment/Plan: 1. Acute metabolic encephalopathy secondary to AKI and UTI. CT and MRI unremarkable. Neurology recommend supportive care. Continue IV abx and fluids 2. UTI. UC pending but shows gram negative rods. Continue IV antibiotics. 3. Acute kidney injury superimposed by chronic kidney disease stage III-IV. CKD appears to be at baseline. 4. Diabetes. This appears to be diet controlled. CBG stable. Continue SSI. 5. Hypertension. Stable. Continue metoprolol. 6. Hypothyroidism. Continue synthroid.   Overall continues to improve. Continue abx and fluids. Follow up culture data  Discharge home in 24 hours  Code Status: Full DVT prophylaxis: Lovenox Family discussion: Dicussed plan in detail. No further concerns at this time. Disposition Plan: Discharge home within 24 hours  Murray Hodgkins, MD  [facility]  Pager [phone] If 7PM-7AM, please contact night-coverage at [email or web address], password [email or web address] 09/17/2014, 8:21 AM  LOS: 3 days   Consultants:  Neurology  Procedures:    Antibiotics:  Rocephin 9/11>>  HPI/Subjective: Feeling okay. No nausea, vomiting or abdominal pain. Tolerating diet.   Objective: Filed Vitals:   09/16/14 0442 09/16/14 1438 09/16/14 2117 09/17/14 0500  BP: 145/69 132/50 133/62 153/79  Pulse: 59 57 54 87  Temp: 98.1 F (36.7 C) 98 F (36.7 C) 98.2 F (36.8 C) 98.7 F (37.1 C)  TempSrc: Oral Oral Oral Oral  Resp: 20 20 20 16   Height:      Weight:      SpO2: 99% 99% 100% 98%    Intake/Output Summary (Last 24 hours) at 09/17/14 0821 Last data filed at 09/17/14 0646  Gross per 24 hour  Intake 2422.5 ml  Output    300 ml  Net 2122.5 ml     Filed Weights   09/14/14 1830  Weight: 71.782 kg (158 lb 4 oz)    Exam: Afebrile, VSS General:  Appears calm and comfortable Sitting in chair. Cardiovascular: RRR, no m/r/g. No LE edema. Respiratory: CTA bilaterally, no w/r/r. Normal respiratory effort. Abdomen: soft, ntnd Musculoskeletal: grossly normal tone BUE/BLE Psychiatric: grossly normal mood and affect, speech fluent and appropriate Neurologic: Alert and oriented to self, location, month, year  New data reviewed:  Creatinine/BUN 1.56/20, stable  Hgb at baseline  Potassium WNL.  Pertinent data since admission:  CT Chest  Impression -Mild cardiomegaly without failure. No acute cardiopulmonary disease.  CT Head Impression -Atrophy and chronic ischemic white matter disease without acute intracranial abnormality.  MRI Brain Impression -No acute or reversible finding. Brain atrophy and moderate chronic small-vessel ischemic changes of the cerebral hemispheric white matter.  Pending data:  UC  Scheduled Meds: . amLODipine  5 mg Oral Daily  . aspirin  81 mg Oral Daily  . cefTRIAXone (ROCEPHIN)  IV  1 g Intravenous Q24H  . cyanocobalamin  1,000 mcg Intramuscular 1 day or 1 dose  . enoxaparin (LOVENOX) injection  30 mg Subcutaneous Q24H  . insulin aspart  0-5 Units Subcutaneous QHS  . insulin aspart  0-9 Units Subcutaneous TID WC  . levothyroxine  112 mcg Oral QAC breakfast  . metoprolol tartrate  25 mg Oral BID  . omega-3 acid ethyl esters  1 g Oral Daily  . traMADol  50 mg Oral BID  . Vitamin D (Ergocalciferol)  50,000 Units Oral Once per day on Mon Thu  Continuous Infusions: . sodium chloride 75 mL/hr at 09/17/14 0015    Principal Problem:   UTI (lower urinary tract infection) Active Problems:   AKI (acute kidney injury)   CKD (chronic kidney disease), stage III   Type 2 diabetes with nephropathy   Essential hypertension   Metabolic encephalopathy   Time spent 20 minutes  By signing my name below, I, Rhett Bannister attest that this documentation has been prepared under the direction and in the presence of Dr. Murray Hodgkins, M.D.   Electronically signed: Rhett Bannister  09/17/2014   I have reviewed the above documentation for accuracy and completeness, and I agree with the above. Murray Hodgkins, MD

## 2014-09-17 NOTE — Care Management Note (Signed)
Case Management Note  Patient Details  Name: NEASIA FLEEMAN MRN: [id number] Date of Birth: 1932-02-06   Expected Discharge Date:  09/18/14               Expected Discharge Plan:  Home/Self Care  In-House Referral:  NA  Discharge planning Services  CM Consult  Post Acute Care Choice:  NA Choice offered to:  NA  DME Arranged:    DME Agency:     HH Arranged:    [place] Agency:     Status of Service:  Completed, signed off  Medicare Important Message Given:  Yes-second notification given Date Medicare IM Given:    Medicare IM give by:    Date Additional Medicare IM Given:    Additional Medicare Important Message give by:     If discussed at [place] of Stay Meetings, dates discussed:    Additional Comments: DC anticipated today. No CM needs noted.  Sherald Barge, RN 09/17/2014, 11:35 AM

## 2014-09-17 NOTE — Care Management Important Message (Signed)
Important Message  Patient Details  Name: KINZLEIGH KANDLER MRN: [id number] Date of Birth: 04/10/1932   Medicare Important Message Given:  Yes-second notification given    Sherald Barge, RN 09/17/2014, 11:34 AM

## 2014-09-18 DIAGNOSIS — N183 Chronic kidney disease, stage 3 (moderate): Secondary | ICD-10-CM

## 2014-09-18 LAB — GLUCOSE, CAPILLARY
GLUCOSE-CAPILLARY: 110 mg/dL — AB (ref 65–99)
Glucose-Capillary: 110 mg/dL — ABNORMAL HIGH (ref 65–99)

## 2014-09-18 MED ORDER — AMLODIPINE BESYLATE 10 MG PO TABS: 5.0000 mg | ORAL_TABLET | Freq: Every day | ORAL | Status: DC

## 2014-09-18 NOTE — Progress Notes (Signed)
Pt's IV catheter removed and intact. Pt's IV site clean dry and intact. Discharge instructions, follow up appointments and medications reviewed and discussed with patient's daughter [name].  Pt's daughter verbalized understanding. All questions were answered and no further questions at this time. Pt in stable condition and in no acute distress at this time. Pt escorted by nurse tech.

## 2014-09-18 NOTE — Progress Notes (Signed)
PROGRESS NOTE  Joann Hunter BWG:[id number] DOB: [birth year] DOA: 09/14/2014 PCP: Monico Blitz, MD  Summary: 70 yof with a history of CKD stage III and DM type 2, presented to the ED with acute confusion. Evaluation in the emergency department revealed a UTI and worsening renal failure. Admitted for further management.    Assessment/Plan: 1. Acute metabolic encephalopathy, resolved. Suspect secondary to AKI. Doubt UTI. CT and MRI unremarkable.  2. Acute kidney injury superimposed by chronic kidney disease stage III-IV. CKD appears to be at baseline. Secondary to FTT. 3. Urine colonization. UTI was considered on admission however, favor colonization as UC pending but suspect ESBL as on last admission (when colonization was also diagnosed and abx stopped). Discontinue Rocephin. 4. Diabetes Type 2. This appears to be diet controlled. CBG stable. Continue SSI. 5. Hypertension. Stable. Continue metoprolol. 6. Hypothyroidism. Continue synthroid.   Overall improved, discharge home today.  Follow-up UC but given recent admission and clinical improvement this hospitalization, doubt UTI and  favor colonization. No further treatment necessary.  Discussed in detail with daughter Tye [place] by telephone including rationale for no antibiotic.  Code Status: Full DVT prophylaxis: Lovenox Family discussion: Discussed with patient who understands and has no concerns at this time. Disposition Plan: Discharge home today.  Murray Hodgkins, MD  [facility]  Pager [phone] If 7PM-7AM, please contact night-coverage at [email or web address], password [facility] 09/18/2014, 7:11 AM  LOS: 4 days   Consultants:  Roena Malady, MD  Procedures:    Antibiotics:  Rocephin 9/11>>9/15  HPI/Subjective: Feels fine. Denies any nausea, vomiting, pain or pressure during urination. Able to eat and ambulate without difficulty.   Objective: Filed Vitals:   09/17/14 0500 09/17/14 1345 09/17/14 2156 09/18/14  0636  BP: 153/79 138/84 151/59 128/49  Pulse: 87 65 78 75  Temp: 98.7 F (37.1 C) 97.8 F (36.6 C) 98.7 F (37.1 C) 98.5 F (36.9 C)  TempSrc: Oral Oral Oral Oral  Resp: 16 16 16 20   Height:      Weight:      SpO2: 98% 100% 97% 98%    Intake/Output Summary (Last 24 hours) at 09/18/14 0711 Last data filed at 09/18/14 0641  Gross per 24 hour  Intake    340 ml  Output   1900 ml  Net  -1560 ml     Filed Weights   09/14/14 1830  Weight: 71.782 kg (158 lb 4 oz)    Exam: Afebrile, VSS General:  Appears calm and comfortable. Sitting in chair eating lunch.  Cardiovascular: RRR, no m/r/g. Trace LE edema. Respiratory: CTA bilaterally, no w/r/r. Normal respiratory effort. Musculoskeletal: grossly normal tone BUE/BLE Psychiatric: grossly normal mood and affect, speech fluent and appropriate. Oriented to self and location, not month and year. Neurologic: grossly non-focal.  New data reviewed:  CBG stable  UOP 1900  Pertinent data since admission:  CT Chest  Impression -Mild cardiomegaly without failure. No acute cardiopulmonary disease.  CT Head Impression -Atrophy and chronic ischemic white matter disease without acute intracranial abnormality.  MRI Brain Impression -No acute or reversible finding. Brain atrophy and moderate chronic small-vessel ischemic changes of the cerebral hemispheric white matter.  Pending data:  UC  Scheduled Meds: . amLODipine  5 mg Oral Daily  . aspirin  81 mg Oral Daily  . cefTRIAXone (ROCEPHIN)  IV  1 g Intravenous Q24H  . enoxaparin (LOVENOX) injection  30 mg Subcutaneous Q24H  . insulin aspart  0-5 Units Subcutaneous QHS  . insulin aspart  0-9 Units Subcutaneous TID WC  .  levothyroxine  112 mcg Oral QAC breakfast  . metoprolol tartrate  25 mg Oral BID  . omega-3 acid ethyl esters  1 g Oral Daily  . traMADol  50 mg Oral BID  . Vitamin D (Ergocalciferol)  50,000 Units Oral Once per day on Mon Thu   Continuous Infusions:     Principal Problem:   AKI (acute kidney injury) Active Problems:   UTI (lower urinary tract infection)   CKD (chronic kidney disease), stage III   Type 2 diabetes with nephropathy   Essential hypertension   Metabolic encephalopathy    By signing my name below, I, Rennis Harding attest that this documentation has been prepared under the direction and in the presence of Murray Hodgkins, MD Electronically signed: Rennis Harding  09/18/2014  I have reviewed the above documentation for accuracy and completeness, and I agree with the above. Murray Hodgkins, MD

## 2014-09-18 NOTE — Discharge Summary (Signed)
Physician Discharge Summary  Joann Hunter IRJ:[id number] DOB: 1932-04-24 DOA: 09/14/2014  PCP: Monico Blitz, MD  Admit date: 09/14/2014 Discharge date: 09/18/2014  Recommendations for Outpatient Follow-up:   Follow up with PCP in 1-2 weeks for FTT, poor oral intake  Consider BMP as outaptient.  Follow-up urine culture, pending, but suspect colonization and would not treat unless symptomatic.  Follow-up Information    Follow up with [facility], MD. Schedule an appointment as soon as possible for a visit in 1 week.   Specialty:  Internal Medicine   Contact information:   [address]  [place] [address] [phone]        Discharge Diagnoses:  1. Acute metabolic encephalopathy. 2. Acute kidney injury superimposed by chronic kidney disease stage III-IV.  3. ESBL colonization of the urine 4. Diabetes type 2.  5. Hypertension.  6. Hypothyroidism.    Discharge Condition: Improved Disposition: Home  Diet recommendation: Heart Healthy  Filed Weights   09/14/14 1830  Weight: 71.782 kg (158 lb 4 oz)    History of present illness:  76 yof with a history of CKD stage III and DM type 2, presented to the ED with acute confusion. Evaluation in the emergency department revealed a UTI and worsening renal failure. Admitted for further management.   Hospital Course:  Treated with empiric abx, IVF and supportive care. Rapidly improved, tolerated diet. UTI was initially considered but culture suggests ESBL, also seen 07/2014 which was considered colonization at this time. While culture results not available suspect ESBL resistant to ceftriaxone. Therefore given patient's clinical improvement, no evidence of UTI, favor colonization and would not treat unless becomes symptomatic. MRI brain and head CT were unremarkable. Neurology consulted and agreed with treatment plan. AKI improved back to baseline with IVF. Discussed with daughter, patient with intermittent confusion in past and poor  oral intake. Daughter will monitor closely.  Individual issues listed as below: 1. Acute metabolic encephalopathy, resolved. Suspect secondary to AKI. Doubt UTI. CT and MRI unremarkable.  2. Acute kidney injury superimposed by chronic kidney disease stage III-IV. CKD appears to be at baseline. Secondary to FTT. 3. Urine colonization. UTI was considered on admission however, favor colonization as UC pending but suspect ESBL as on last admission (when colonization was also diagnosed and abx stopped). Discontinue Rocephin. 4. Diabetes Type 2. This appears to be diet controlled. CBG stable. Continue SSI. 5. Hypertension. Stable. Continue metoprolol. 6. Hypothyroidism. Continue synthroid.  Consultants:  Roena Malady, MD  Procedures:  None  Antibiotics:  Rocephin 9/11>>9/15   Discharge Instructions Discharge Instructions    Diet Carb Modified    Complete by:  As directed      Discharge instructions    Complete by:  As directed   Call your physician or seek immediate medical attention for confusion, fever, pain, poor eating or worsening of condition.     Increase activity slowly    Complete by:  As directed             Discharge Medication List as of 09/18/2014  4:16 PM    CONTINUE these medications which have CHANGED   Details  amLODipine (NORVASC) 10 MG tablet Take 0.5 tablets (5 mg total) by mouth daily., Starting 09/18/2014, Until Discontinued, No Print      CONTINUE these medications which have NOT CHANGED   Details  aspirin 81 MG chewable tablet Chew 81 mg by mouth daily. , Until Discontinued, Historical Med    fish oil-omega-3 fatty acids 1000 MG capsule Take 1  g by mouth daily., Until Discontinued, Historical Med    levothyroxine (SYNTHROID, LEVOTHROID) 112 MCG tablet Take 1 tablet (112 mcg total) by mouth every morning., Starting 12/16/2013, Until Discontinued, Normal    meclizine (ANTIVERT) 25 MG tablet Take 25 mg by mouth 4 (four) times daily as  needed. Dizziness, Until Discontinued, Historical Med    metoprolol tartrate (LOPRESSOR) 25 MG tablet Take 25 mg by mouth 2 (two) times daily. , Starting 04/23/2011, Until Discontinued, Historical Med    ondansetron (ZOFRAN-ODT) 4 MG disintegrating tablet Take 4 mg by mouth every 8 (eight) hours as needed for nausea or vomiting., Until Discontinued, Historical Med    RANITIDINE HCL PO Take 1 tablet by mouth 2 (two) times daily., Until Discontinued, Historical Med    traMADol (ULTRAM) 50 MG tablet Take 50 mg by mouth 2 (two) times daily. , Starting 04/26/2014, Until Discontinued, Historical Med    demeclocycline (DECLOMYCIN) 150 MG tablet Take 1 tablet (150 mg total) by mouth every 12 (twelve) hours., Starting 08/02/2014, Until Discontinued, Normal    nitroGLYCERIN (NITROSTAT) 0.4 MG SL tablet Place 0.4 mg under the tongue every 5 (five) minutes x 3 doses as needed. For chest pain., Starting 04/23/2011, Until Discontinued, Historical Med    Vitamin D, Ergocalciferol, (DRISDOL) 50000 UNITS CAPS Take 50,000 Units by mouth 2 (two) times a week. Tuesdays and Thurdays, Starting 05/30/2012, Until Discontinued, Historical Med      STOP taking these medications     doxycycline (VIBRA-TABS) 100 MG tablet        Allergies  Allergen Reactions  . Ciprofloxacin Other (See Comments)    Hallucination  . Lipitor [Atorvastatin] Other (See Comments)    Muscle Weakness  . Penicillins Rash    The results of significant diagnostics from this hospitalization (including imaging, microbiology, ancillary and laboratory) are listed below for reference.    Significant Diagnostic Studies: Dg Chest 2 View  09/14/2014   CLINICAL DATA:  Altered mental status. Chronic urinary tract infection. Increase confusion.  EXAM: CHEST  2 VIEW  COMPARISON:  07/27/2014.  02/13/2013.  FINDINGS: Cardiopericardial silhouette is mildly enlarged for projection. This is unchanged compared to 7/20 04/2014. Aortic arch atherosclerosis.  There is no airspace disease or effusion. Subsegmental atelectasis or scarring is present at the LEFT base. Osteopenia. Monitoring leads project over the chest.  IMPRESSION: Mild cardiomegaly without failure. No acute cardiopulmonary disease.   Electronically Signed   By: Dereck Ligas M.D.   On: 09/14/2014 16:42   Ct Head Wo Contrast  09/14/2014   CLINICAL DATA:  Altered mental status. Increasing confusion. Chronic urinary tract infection.  EXAM: CT HEAD WITHOUT CONTRAST  TECHNIQUE: Contiguous axial images were obtained from the base of the skull through the vertex without intravenous contrast.  COMPARISON:  04/04/2014.  FINDINGS: No mass lesion, mass effect, midline shift, hydrocephalus, hemorrhage. No acute territorial cortical ischemia/infarct. Atrophy and chronic ischemic white matter disease is present.  IMPRESSION: Atrophy and chronic ischemic white matter disease without acute intracranial abnormality.   Electronically Signed   By: Dereck Ligas M.D.   On: 09/14/2014 16:55   Mr Brain Wo Contrast  09/15/2014   CLINICAL DATA:  Altered mental status of 4 days duration. Associated generalized weakness.  EXAM: MRI HEAD WITHOUT CONTRAST  TECHNIQUE: Multiplanar, multiecho pulse sequences of the brain and surrounding structures were obtained without intravenous contrast.  COMPARISON:  CT 09/14/2014  FINDINGS: Diffusion imaging does not show any acute or subacute infarction. The brainstem is normal. No cerebellar abnormality.  The cerebral hemispheres show generalized atrophy with chronic small-vessel ischemic changes affecting the deep white matter. No cortical or large vessel territory infarction. No mass lesion, hemorrhage, hydrocephalus or extra-axial collection. No pituitary mass. No inflammatory sinus disease. No skull or skullbase lesion.  IMPRESSION: No acute or reversible finding. Brain atrophy and moderate chronic small-vessel ischemic changes of the cerebral hemispheric white matter.    Electronically Signed   By: Nelson Chimes M.D.   On: 09/15/2014 08:49    Microbiology: Recent Results (from the past 240 hour(s))  Urine culture     Status: None (Preliminary result)   Collection Time: 09/14/14  4:00 PM  Result Value Ref Range Status   Specimen Description URINE, CATHETERIZED  Final   Special Requests Normal  Final   Culture   Final    >=100,000 COLONIES/mL KLEBSIELLA PNEUMONIAE SENDING FOR MODIFIED HODGE TO CONFIRM RESISTANT SUSCEPTIBILTY PATTERN RESULT CALLED TO, READ BACK BY AND VERIFIED WITH: L BULLINS,RN AT [place] 09/18/14 BY L BENFIELD Performed at [facility]    Report Status PENDING  Incomplete     Labs: Basic Metabolic Panel:  Recent Labs Lab 09/14/14 1305 09/15/14 0605 09/16/14 0624 09/17/14 0613  NA 135 136 133* 135  K 4.5 4.1 4.3 3.9  CL 101 105 105 106  CO2 27 25 22  21*  GLUCOSE 187* 115* 118* 138*  BUN 29* 30* 26* 20  CREATININE 2.11* 1.75* 1.52* 1.56*  CALCIUM 9.3 8.5* 8.3* 8.3*   Liver Function Tests:  Recent Labs Lab 09/14/14 1305 09/15/14 0605  AST 19 18  ALT 10* 10*  ALKPHOS 64 57  BILITOT 0.5 0.5  PROT 6.8 5.5*  ALBUMIN 3.4* 2.7*   CBC:  Recent Labs Lab 09/14/14 1305 09/15/14 0605  WBC 5.5 4.0  HGB 11.8* 10.2*  HCT 34.9* 30.1*  MCV 93.3 93.5  PLT 259 211   Cardiac Enzymes:  Recent Labs Lab 09/14/14 1305  TROPONINI <0.03     CBG:  Recent Labs Lab 09/17/14 0743 09/17/14 1134 09/17/14 1646 09/17/14 2154 09/18/14 0745  GLUCAP 127* 115* 149* 167* 110*    Principal Problem:   AKI (acute kidney injury) Active Problems:   UTI (lower urinary tract infection)   CKD (chronic kidney disease), stage III   Type 2 diabetes with nephropathy   Essential hypertension   Metabolic encephalopathy   Time coordinating discharge: 35 minutes  Signed:  Murray Hodgkins, MD [facility] 09/18/2014, 8:42 AM   By signing my name below, I, Rennis Harding attest that this documentation has been  prepared under the direction and in the presence of Murray Hodgkins, MD Electronically signed: Rennis Harding  09/18/2014  I have reviewed the above documentation for accuracy and completeness, and I agree with the above. Murray Hodgkins, MD

## 2014-09-18 NOTE — Progress Notes (Signed)
Informed by Magda Paganini [facility]) that Joann Hunter's urine specimen has a very resistant organism and specimen was sent for confirmation.

## 2014-09-24 LAB — URINE CULTURE: Culture: >=100000

## 2014-11-05 ENCOUNTER — Encounter (HOSPITAL_COMMUNITY): Payer: Self-pay | Admitting: *Deleted

## 2014-11-05 ENCOUNTER — Emergency Department (HOSPITAL_COMMUNITY): Payer: Medicare Other

## 2014-11-05 ENCOUNTER — Inpatient Hospital Stay (HOSPITAL_COMMUNITY)
Admission: EM | Admit: 2014-11-05 | Discharge: 2014-11-06 | DRG: 394 | Disposition: A | Discharge status: Home / Self Care | Payer: Medicare Other | Attending: Internal Medicine | Admitting: Internal Medicine

## 2014-11-05 DIAGNOSIS — M1612 Unilateral primary osteoarthritis, left hip: Secondary | ICD-10-CM | POA: Diagnosis present

## 2014-11-05 DIAGNOSIS — E1122 Type 2 diabetes mellitus with diabetic chronic kidney disease: Secondary | ICD-10-CM | POA: Diagnosis present

## 2014-11-05 DIAGNOSIS — D649 Anemia, unspecified: Secondary | ICD-10-CM | POA: Diagnosis not present

## 2014-11-05 DIAGNOSIS — K5900 Constipation, unspecified: Secondary | ICD-10-CM | POA: Diagnosis present

## 2014-11-05 DIAGNOSIS — I10 Essential (primary) hypertension: Secondary | ICD-10-CM

## 2014-11-05 DIAGNOSIS — Z955 Presence of coronary angioplasty implant and graft: Secondary | ICD-10-CM

## 2014-11-05 DIAGNOSIS — I252 Old myocardial infarction: Secondary | ICD-10-CM | POA: Diagnosis not present

## 2014-11-05 DIAGNOSIS — E1121 Type 2 diabetes mellitus with diabetic nephropathy: Secondary | ICD-10-CM | POA: Diagnosis present

## 2014-11-05 DIAGNOSIS — Z791 Long term (current) use of non-steroidal anti-inflammatories (NSAID): Secondary | ICD-10-CM

## 2014-11-05 DIAGNOSIS — E039 Hypothyroidism, unspecified: Secondary | ICD-10-CM | POA: Diagnosis present

## 2014-11-05 DIAGNOSIS — K625 Hemorrhage of anus and rectum: Secondary | ICD-10-CM

## 2014-11-05 DIAGNOSIS — E785 Hyperlipidemia, unspecified: Secondary | ICD-10-CM | POA: Diagnosis present

## 2014-11-05 DIAGNOSIS — N183 Chronic kidney disease, stage 3 (moderate): Secondary | ICD-10-CM | POA: Diagnosis present

## 2014-11-05 DIAGNOSIS — I129 Hypertensive chronic kidney disease with stage 1 through stage 4 chronic kidney disease, or unspecified chronic kidney disease: Secondary | ICD-10-CM | POA: Diagnosis present

## 2014-11-05 DIAGNOSIS — D638 Anemia in other chronic diseases classified elsewhere: Secondary | ICD-10-CM | POA: Diagnosis present

## 2014-11-05 DIAGNOSIS — K649 Unspecified hemorrhoids: Principal | ICD-10-CM | POA: Diagnosis present

## 2014-11-05 DIAGNOSIS — N179 Acute kidney failure, unspecified: Secondary | ICD-10-CM | POA: Diagnosis present

## 2014-11-05 DIAGNOSIS — E871 Hypo-osmolality and hyponatremia: Secondary | ICD-10-CM | POA: Diagnosis present

## 2014-11-05 DIAGNOSIS — I2510 Atherosclerotic heart disease of native coronary artery without angina pectoris: Secondary | ICD-10-CM | POA: Diagnosis present

## 2014-11-05 DIAGNOSIS — N289 Disorder of kidney and ureter, unspecified: Secondary | ICD-10-CM

## 2014-11-05 LAB — URINALYSIS, ROUTINE W REFLEX MICROSCOPIC
Bilirubin Urine: NEGATIVE
Glucose, UA: NEGATIVE mg/dL
KETONES UR: NEGATIVE mg/dL
Nitrite: POSITIVE — AB
PROTEIN: NEGATIVE mg/dL
Specific Gravity, Urine: 1.015 (ref 1.005–1.030)
UROBILINOGEN UA: 0.2 mg/dL (ref 0.0–1.0)
pH: 5.5 (ref 5.0–8.0)

## 2014-11-05 LAB — CBC WITH DIFFERENTIAL/PLATELET
BASOS ABS: 0 10*3/uL (ref 0.0–0.1)
Basophils Relative: 0 %
Eosinophils Absolute: 0.1 10*3/uL (ref 0.0–0.7)
Eosinophils Relative: 2 %
HEMATOCRIT: 31.9 % — AB (ref 36.0–46.0)
HEMOGLOBIN: 11 g/dL — AB (ref 12.0–15.0)
LYMPHS PCT: 12 %
Lymphs Abs: 0.8 10*3/uL (ref 0.7–4.0)
MCH: 31.4 pg (ref 26.0–34.0)
MCHC: 34.5 g/dL (ref 30.0–36.0)
MCV: 91.1 fL (ref 78.0–100.0)
Monocytes Absolute: 0.7 10*3/uL (ref 0.1–1.0)
Monocytes Relative: 10 %
NEUTROS ABS: 4.9 10*3/uL (ref 1.7–7.7)
NEUTROS PCT: 76 %
Platelets: 175 10*3/uL (ref 150–400)
RBC: 3.5 MIL/uL — AB (ref 3.87–5.11)
RDW: 12.4 % (ref 11.5–15.5)
WBC: 6.5 10*3/uL (ref 4.0–10.5)

## 2014-11-05 LAB — COMPREHENSIVE METABOLIC PANEL
ALT: 8 U/L — AB (ref 14–54)
AST: 18 U/L (ref 15–41)
Albumin: 3.4 g/dL — ABNORMAL LOW (ref 3.5–5.0)
Alkaline Phosphatase: 59 U/L (ref 38–126)
Anion gap: 6 (ref 5–15)
BUN: 19 mg/dL (ref 6–20)
CHLORIDE: 99 mmol/L — AB (ref 101–111)
CO2: 25 mmol/L (ref 22–32)
CREATININE: 2.04 mg/dL — AB (ref 0.44–1.00)
Calcium: 9.1 mg/dL (ref 8.9–10.3)
GFR calc non Af Amer: 22 mL/min — ABNORMAL LOW (ref >60)
GFR, EST AFRICAN AMERICAN: 25 mL/min — AB (ref >60)
Glucose, Bld: 141 mg/dL — ABNORMAL HIGH (ref 65–99)
POTASSIUM: 4.5 mmol/L (ref 3.5–5.1)
SODIUM: 130 mmol/L — AB (ref 135–145)
Total Bilirubin: 0.8 mg/dL (ref 0.3–1.2)
Total Protein: 6.5 g/dL (ref 6.5–8.1)

## 2014-11-05 LAB — URINE MICROSCOPIC-ADD ON

## 2014-11-05 LAB — PROTIME-INR
INR: 1.19 (ref 0.00–1.49)
PROTHROMBIN TIME: 15.3 s — AB (ref 11.6–15.2)

## 2014-11-05 LAB — LACTIC ACID, PLASMA
LACTIC ACID, VENOUS: 1.7 mmol/L (ref 0.5–2.0)
Lactic Acid, Venous: 0.9 mmol/L (ref 0.5–2.0)

## 2014-11-05 LAB — SAMPLE TO BLOOD BANK

## 2014-11-05 MED ORDER — NITROGLYCERIN 0.4 MG SL SUBL: 0.4000 mg | SUBLINGUAL_TABLET | SUBLINGUAL | Status: DC | PRN

## 2014-11-05 MED ORDER — ONDANSETRON HCL 4 MG PO TABS: 4.0000 mg | ORAL_TABLET | Freq: Four times a day (QID) | ORAL | Status: DC | PRN

## 2014-11-05 MED ORDER — ONDANSETRON HCL 4 MG/2ML IJ SOLN: 4.0000 mg | Freq: Four times a day (QID) | INTRAMUSCULAR | Status: DC | PRN

## 2014-11-05 MED ORDER — AMLODIPINE BESYLATE 5 MG PO TABS
5.0000 mg | ORAL_TABLET | Freq: Every day | ORAL | Status: DC
  Administered 2014-11-06: 5 mg via ORAL
  Filled 2014-11-05: qty 1

## 2014-11-05 MED ORDER — LEVOTHYROXINE SODIUM 112 MCG PO TABS
112.0000 ug | ORAL_TABLET | Freq: Every morning | ORAL | Status: DC
  Administered 2014-11-06: 112 ug via ORAL
  Filled 2014-11-05: qty 1

## 2014-11-05 MED ORDER — ACETAMINOPHEN 650 MG RE SUPP
650.0000 mg | Freq: Four times a day (QID) | RECTAL | Status: DC | PRN
Start: 2014-11-05 — End: 2014-11-06

## 2014-11-05 MED ORDER — METOPROLOL TARTRATE 25 MG PO TABS
25.0000 mg | ORAL_TABLET | Freq: Two times a day (BID) | ORAL | Status: DC
  Administered 2014-11-05 – 2014-11-06 (×2): 25 mg via ORAL
  Filled 2014-11-05 (×2): qty 1

## 2014-11-05 MED ORDER — PANTOPRAZOLE SODIUM 40 MG PO TBEC
40.0000 mg | DELAYED_RELEASE_TABLET | Freq: Two times a day (BID) | ORAL | Status: DC
  Administered 2014-11-05 – 2014-11-06 (×2): 40 mg via ORAL
  Filled 2014-11-05 (×2): qty 1

## 2014-11-05 MED ORDER — TRAMADOL HCL 50 MG PO TABS
50.0000 mg | ORAL_TABLET | Freq: Two times a day (BID) | ORAL | Status: DC
  Administered 2014-11-05 – 2014-11-06 (×2): 50 mg via ORAL
  Filled 2014-11-05 (×2): qty 1

## 2014-11-05 MED ORDER — HYDROCORTISONE ACETATE 25 MG RE SUPP
25.0000 mg | Freq: Two times a day (BID) | RECTAL | Status: DC
  Administered 2014-11-05 – 2014-11-06 (×2): 25 mg via RECTAL
  Filled 2014-11-05 (×2): qty 1

## 2014-11-05 MED ORDER — SODIUM CHLORIDE 0.9 % IV SOLN
INTRAVENOUS | Status: DC
  Administered 2014-11-05: 14:00:00 via INTRAVENOUS

## 2014-11-05 MED ORDER — MECLIZINE HCL 12.5 MG PO TABS: 25.0000 mg | ORAL_TABLET | Freq: Four times a day (QID) | ORAL | Status: DC | PRN

## 2014-11-05 MED ORDER — ASPIRIN 81 MG PO CHEW
81.0000 mg | CHEWABLE_TABLET | Freq: Every day | ORAL | Status: DC
  Administered 2014-11-06: 81 mg via ORAL
  Filled 2014-11-05: qty 1

## 2014-11-05 MED ORDER — SODIUM CHLORIDE 0.9 % IV SOLN
INTRAVENOUS | Status: DC
  Administered 2014-11-05: 18:00:00 via INTRAVENOUS

## 2014-11-05 MED ORDER — SODIUM CHLORIDE 0.9 % IJ SOLN
3.0000 mL | Freq: Two times a day (BID) | INTRAMUSCULAR | Status: DC
  Administered 2014-11-06: 3 mL via INTRAVENOUS

## 2014-11-05 MED ORDER — ACETAMINOPHEN 325 MG PO TABS
650.0000 mg | ORAL_TABLET | Freq: Four times a day (QID) | ORAL | Status: DC | PRN
Start: 2014-11-05 — End: 2014-11-06

## 2014-11-05 NOTE — ED Notes (Signed)
Unable to obtain IV access . Nurse using [place] to try and gain IV ascess

## 2014-11-05 NOTE — ED Provider Notes (Signed)
CSN: [id number]     Arrival date & time 11/05/14  1129 History   First MD Initiated Contact with Patient 11/05/14 1147     Chief Complaint  Patient presents with  . Rectal Bleeding      HPI Pt was seen at 1200.  Per pt and her family, c/o gradual onset and worsening of persistent "black stools" and rectal bleeding that began yesterday. Pt took a laxative for constipation 2 days ago. Pt had BM last night that was "black" with "red blood." Pt states she slept with a pad on overnight and the pad was "filled with red blood." Pt had another BM today with same results. Pt was evaluated by her PMD this morning, then sent to the ED for further evaluation. Denies abd pain, no rectal pain, no back pain, no N/V/D, no CP/SOB, no fevers.    Past Medical History  Diagnosis Date  . Hypothyroidism   . CAD (coronary artery disease)     NSTEMI 04/2011 with newly diagnosed 3V CAD s/p PCI/DES mLAD 04/11/11 with residual dz (per Dr. Burt Knack, should have consideration of PCI of the occluded RCA based on symptoms and/or consideration of outpatient nuclear perfusion testing after the patient recovers from her infarct)  . Ischemic cardiomyopathy     EF 45% by cath 04/20/11, 50-55% by echo 04/22/11. hypotension limiting medication titration   . Hyperlipidemia   . Hyperglycemia   . Hypertension   . Back pain, chronic   . Myocardial infarction ([place]) 2013  . UTI (urinary tract infection) 12/13/2013  . Arthritis   . Type 2 diabetes with nephropathy ([place]) 07/26/2014   Past Surgical History  Procedure Laterality Date  . Breast lumpectomy      right  . Coronary stent placement    . Back surgery    . Left heart catheterization with coronary angiogram N/A 04/20/2011    Procedure: LEFT HEART CATHETERIZATION WITH CORONARY ANGIOGRAM;  Surgeon: Burnell Blanks, MD;  Location: [facility] CATH LAB;  Service: Cardiovascular;  Laterality: N/A;  . Percutaneous coronary stent intervention (pci-s) N/A 04/21/2011    Procedure:  PERCUTANEOUS CORONARY STENT INTERVENTION (PCI-S);  Surgeon: Sherren Mocha, MD;  Location: [facility] CATH LAB;  Service: Cardiovascular;  Laterality: N/A;   Family History  Problem Relation Age of Onset  . Other      no known family cardiac disease   Social History  Substance Use Topics  . Smoking status: Never Smoker   . Smokeless tobacco: Never Used  . Alcohol Use: No   OB History    Gravida Para Term Preterm AB TAB SAB Ectopic Multiple Living   2 2 2       2      Review of Systems ROS: Statement: All systems negative except as marked or noted in the HPI; Constitutional: Negative for fever and chills. ; ; Eyes: Negative for eye pain, redness and discharge. ; ; ENMT: Negative for ear pain, hoarseness, nasal congestion, sinus pressure and sore throat. ; ; Cardiovascular: Negative for chest pain, palpitations, diaphoresis, dyspnea and peripheral edema. ; ; Respiratory: Negative for cough, wheezing and stridor. ; ; Gastrointestinal: Negative for nausea, vomiting, diarrhea, abdominal pain, hematemesis, jaundice and +black stools, +rectal bleeding. . ; ; Genitourinary: Negative for dysuria, flank pain and hematuria. ; ; Musculoskeletal: Negative for back pain and neck pain. Negative for swelling and trauma.; ; Skin: Negative for pruritus, rash, abrasions, blisters, bruising and skin lesion.; ; Neuro: Negative for headache, lightheadedness and neck stiffness. Negative for weakness,  altered level of consciousness , altered mental status, extremity weakness, paresthesias, involuntary movement, seizure and syncope.      Allergies  Ciprofloxacin; Lipitor; and Penicillins  Home Medications   Prior to Admission medications   Medication Sig Start Date End Date Taking? Authorizing Provider  amLODipine (NORVASC) 10 MG tablet Take 0.5 tablets (5 mg total) by mouth daily. 09/18/14  Yes Samuella Cota, MD  aspirin 81 MG chewable tablet Chew 81 mg by mouth daily.    Yes Historical Provider, MD  fish  oil-omega-3 fatty acids 1000 MG capsule Take 1 g by mouth daily.   Yes Historical Provider, MD  levothyroxine (SYNTHROID, LEVOTHROID) 112 MCG tablet Take 1 tablet (112 mcg total) by mouth every morning. 12/16/13  Yes Verlee Monte, MD  meclizine (ANTIVERT) 25 MG tablet Take 25 mg by mouth 4 (four) times daily as needed. Dizziness   Yes Historical Provider, MD  metoprolol tartrate (LOPRESSOR) 25 MG tablet Take 25 mg by mouth 2 (two) times daily.  04/23/11  Yes Dayna N Dunn, PA-C  nitroGLYCERIN (NITROSTAT) 0.4 MG SL tablet Place 0.4 mg under the tongue every 5 (five) minutes x 3 doses as needed. For chest pain. 04/23/11  Yes Dayna N Dunn, PA-C  ondansetron (ZOFRAN-ODT) 4 MG disintegrating tablet Take 4 mg by mouth every 8 (eight) hours as needed for nausea or vomiting.   Yes Historical Provider, MD  RANITIDINE HCL PO Take 75 mg by mouth 2 (two) times daily.    Yes Historical Provider, MD  traMADol (ULTRAM) 50 MG tablet Take 50 mg by mouth 2 (two) times daily.  04/26/14  Yes Historical Provider, MD  Vitamin D, Ergocalciferol, (DRISDOL) 50000 UNITS CAPS Take 50,000 Units by mouth 2 (two) times a week. Tuesdays and Thurdays 05/30/12  Yes Historical Provider, MD   BP 122/39 mmHg  Pulse 49  Temp(Src) 97.6 F (36.4 C) (Oral)  Resp 17  Ht 5\' 2"  (1.575 m)  Wt 150 lb (68.04 kg)  BMI 27.43 kg/m2  SpO2 99%   13:07 Orthostatic Vital Signs JM  Orthostatic Lying  - BP- Lying: 129/43 mmHg ; Pulse- Lying: 51  Orthostatic Sitting - BP- Sitting: 131/64 mmHg ; Pulse- Sitting: 52  Orthostatic Standing at 0 minutes - BP- Standing at 0 minutes: 111/48 mmHg ; Pulse- Standing at 0 minutes: 60      Physical Exam  1205: Physical examination:  Nursing notes reviewed; Vital signs and O2 SAT reviewed;  Constitutional: Well developed, Well nourished, In no acute distress; Head:  Normocephalic, atraumatic; Eyes: EOMI, PERRL, No scleral icterus. +conjunctiva pale.; ENMT: Mouth and pharynx normal, Mucous membranes dry; Neck:  Supple, Full range of motion, No lymphadenopathy; Cardiovascular: Regular rate and rhythm, No gallop; Respiratory: Breath sounds clear & equal bilaterally, No wheezes.  Speaking full sentences with ease, Normal respiratory effort/excursion; Chest: Nontender, Movement normal; Abdomen: Soft, Nontender, Nondistended, Normal bowel sounds. Rectal exam performed w/permission of pt and ED RN chaperone present.  Anal tone normal.  Non-tender, scant stool in rectal vault, heme positive.  No fissures, no external hemorrhoids, no palp masses.; Genitourinary: No CVA tenderness; Extremities: Pulses normal, No tenderness, No edema, No calf edema or asymmetry.; Neuro: AA&Ox3, Major CN grossly intact.  Speech clear. No gross focal motor or sensory deficits in extremities.; Skin: Color pale, Warm, Dry.   ED Course  Procedures (including critical care time) Labs Review   Imaging Review  I have personally reviewed and evaluated these images and lab results as part of my medical  decision-making.   EKG Interpretation None      MDM  MDM Reviewed: previous chart, nursing note and vitals Reviewed previous: labs Interpretation: labs and x-ray      Results for orders placed or performed during the hospital encounter of 11/05/14  Comprehensive metabolic panel  Result Value Ref Range   Sodium 130 (L) 135 - 145 mmol/L   Potassium 4.5 3.5 - 5.1 mmol/L   Chloride 99 (L) 101 - 111 mmol/L   CO2 25 22 - 32 mmol/L   Glucose, Bld 141 (H) 65 - 99 mg/dL   BUN 19 6 - 20 mg/dL   Creatinine, Ser 2.04 (H) 0.44 - 1.00 mg/dL   Calcium 9.1 8.9 - 10.3 mg/dL   Total Protein 6.5 6.5 - 8.1 g/dL   Albumin 3.4 (L) 3.5 - 5.0 g/dL   AST 18 15 - 41 U/L   ALT 8 (L) 14 - 54 U/L   Alkaline Phosphatase 59 38 - 126 U/L   Total Bilirubin 0.8 0.3 - 1.2 mg/dL   GFR calc non Af Amer 22 (L) >60 mL/min   GFR calc Af Amer 25 (L) >60 mL/min   Anion gap 6 5 - 15  Lactic acid, plasma  Result Value Ref Range   Lactic Acid, Venous 0.9 0.5  - 2.0 mmol/L  CBC with Differential  Result Value Ref Range   WBC 6.5 4.0 - 10.5 K/uL   RBC 3.50 (L) 3.87 - 5.11 MIL/uL   Hemoglobin 11.0 (L) 12.0 - 15.0 g/dL   HCT 31.9 (L) 36.0 - 46.0 %   MCV 91.1 78.0 - 100.0 fL   MCH 31.4 26.0 - 34.0 pg   MCHC 34.5 30.0 - 36.0 g/dL   RDW 12.4 11.5 - 15.5 %   Platelets 175 150 - 400 K/uL   Neutrophils Relative % 76 %   Neutro Abs 4.9 1.7 - 7.7 K/uL   Lymphocytes Relative 12 %   Lymphs Abs 0.8 0.7 - 4.0 K/uL   Monocytes Relative 10 %   Monocytes Absolute 0.7 0.1 - 1.0 K/uL   Eosinophils Relative 2 %   Eosinophils Absolute 0.1 0.0 - 0.7 K/uL   Basophils Relative 0 %   Basophils Absolute 0.0 0.0 - 0.1 K/uL  Protime-INR  Result Value Ref Range   Prothrombin Time 15.3 (H) 11.6 - 15.2 seconds   INR 1.19 0.00 - 1.49  Sample to Blood Bank  Result Value Ref Range   Blood Bank Specimen SAMPLE AVAILABLE FOR TESTING    Sample Expiration 11/08/2014    Dg Abd Acute W/chest 11/05/2014  CLINICAL DATA:  Constipation, rectal bleeding EXAM: DG ABDOMEN ACUTE W/ 1V CHEST COMPARISON:  None. FINDINGS: There is no evidence of dilated bowel loops or free intraperitoneal air. No radiopaque calculi or other significant radiographic abnormality is seen. Heart size and mediastinal contours are within normal limits. Both lungs are clear. Mild osteoarthritis of the left hip. Degenerative changes of the lower lumbar spine. IMPRESSION: Negative abdominal radiographs.  No acute cardiopulmonary disease. Electronically Signed   By: Kathreen Devoid   On: 11/05/2014 13:16    Results for Joann Hunter, Joann Hunter (MRN [id number]) as of 11/05/2014 13:32  Ref. Range 07/28/2014 06:55 07/29/2014 06:04 09/14/2014 13:05 09/15/2014 06:05 11/05/2014 12:35  Hemoglobin Latest Ref Range: 12.0-15.0 g/dL 10.5 (L) 10.8 (L) 11.8 (L) 10.2 (L) 11.0 (L)  HCT Latest Ref Range: 36.0-46.0 % 30.4 (L) 30.4 (L) 34.9 (L) 30.1 (L) 31.9 (L)   Results for Joann Hunter, Joann Hunter (MRN  [id number]) as of 11/05/2014 13:32  Ref. Range  09/15/2014 06:05 09/16/2014 06:24 09/17/2014 06:13 11/05/2014 12:35  Sodium Latest Ref Range: 135-145 mmol/L 136 133 (L) 135 130 (L)  Chloride Latest Ref Range: 101-111 mmol/L 105 105 106 99 (L)  BUN Latest Ref Range: 6-20 mg/dL 30 (H) 26 (H) 20 19  Creatinine Latest Ref Range: 0.44-1.00 mg/dL 1.75 (H) 1.52 (H) 1.56 (H) 2.04 (H)    1340:  Orthostatic on VS, with mild hyponatremia and elevated BUN/Cr from baseline on labs today; judicious IVF given. H/H near baseline, but stool is heme positive and pt has gross blood on her underclothes and pad. Abd remains benign.  Dx and testing d/w pt and family.  Questions answered.  Verb understanding, agreeable to admit. T/C to [facility] Dr. Roderic Palau, case discussed, including:  HPI, pertinent PM/SHx, VS/PE, dx testing, ED course and treatment:  Agreeable to admit, requests to write temporary orders, obtain tele bed to team 2.   Francine Graven, DO 11/09/14 2031

## 2014-11-05 NOTE — ED Notes (Signed)
Pt states she has been constipated. States she took two dulcolax two days ago. States last night she had a bowel movement and there was bright red blood in her stool. States she put a pad on during the night and there was bright red blood on the pad

## 2014-11-05 NOTE — ED Notes (Signed)
Patient sent for eval of rectal bleeding by Internal Medicine in [place]. Patient states bleeding started this morning and is bright red blood. Patient denies dizzines or shortness of breath.

## 2014-11-05 NOTE — H&P (Signed)
[facility] History and Physical  Joann Hunter JIR:[id number] DOB: Feb 28, 1932 DOA: 11/05/2014  Referring physician: Dr. Thurnell Garbe, ER PCP: Monico Blitz, MD   Chief Complaint: rectal bleeding  HPI: Joann Hunter is a 81 y.o. female who presents to the hospital with complaints of rectal bleeding. According to patient's daughter, she had been constipated the past several days. On 10/31 she had taken a laxative to help her move her bowels. Yesterday she reportedly had 2 bloody bowel movements containing fresh blood. Per ER notes, bowel movements were reported to be black with dark red coloring, but daughter reports that bowel movements were bright red blood. She's had several episodes prior to admission. She denies any chest pain or shortness of breath. Reports that she has felt lightheaded lately. Her by mouth intake has been poor. She also admits to taking daily ibuprofen for arthritis. She cannot recall the last time that she had any endoscopic evaluation. She's been referred for observation.   Review of Systems:  Pertinent positives as per HPI, otherwise negative   Past Medical History  Diagnosis Date  . Hypothyroidism   . CAD (coronary artery disease)     NSTEMI 04/2011 with newly diagnosed 3V CAD s/p PCI/DES mLAD 04/11/11 with residual dz (per Dr. Burt Knack, should have consideration of PCI of the occluded RCA based on symptoms and/or consideration of outpatient nuclear perfusion testing after the patient recovers from her infarct)  . Ischemic cardiomyopathy     EF 45% by cath 04/20/11, 50-55% by echo 04/22/11. hypotension limiting medication titration   . Hyperlipidemia   . Hyperglycemia   . Hypertension   . Back pain, chronic   . Myocardial infarction ([place]) 2013  . UTI (urinary tract infection) 12/13/2013  . Arthritis   . Type 2 diabetes with nephropathy ([place]) 07/26/2014   Past Surgical History  Procedure Laterality Date  . Breast lumpectomy      right  . Coronary stent  placement    . Back surgery    . Left heart catheterization with coronary angiogram N/A 04/20/2011    Procedure: LEFT HEART CATHETERIZATION WITH CORONARY ANGIOGRAM;  Surgeon: Burnell Blanks, MD;  Location[facility] CATH LAB;  Service: Cardiovascular;  Laterality: N/A;  . Percutaneous coronary stent intervention (pci-s) N/A 04/21/2011    Procedure: PERCUTANEOUS CORONARY STENT INTERVENTION (PCI-S);  Surgeon: Sherren Mocha, MD;  Location: [facility] CATH LAB;  Service: Cardiovascular;  Laterality: N/A;   Social History:  reports that she has never smoked. She has never used smokeless tobacco. She reports that she does not drink alcohol or use illicit drugs.  Allergies  Allergen Reactions  . Ciprofloxacin Other (See Comments)    Hallucination  . Lipitor [Atorvastatin] Other (See Comments)    Muscle Weakness  . Penicillins Rash    Family History  Problem Relation Age of Onset  . Other      no known family cardiac disease    Prior to Admission medications   Medication Sig Start Date End Date Taking? Authorizing Provider  amLODipine (NORVASC) 10 MG tablet Take 0.5 tablets (5 mg total) by mouth daily. 09/18/14  Yes Samuella Cota, MD  aspirin 81 MG chewable tablet Chew 81 mg by mouth daily.    Yes Historical Provider, MD  fish oil-omega-3 fatty acids 1000 MG capsule Take 1 g by mouth daily.   Yes Historical Provider, MD  levothyroxine (SYNTHROID, LEVOTHROID) 112 MCG tablet Take 1 tablet (112 mcg total) by mouth every morning. 12/16/13  Yes Verlee Monte, MD  meclizine (ANTIVERT) 25 MG tablet Take 25 mg by mouth 4 (four) times daily as needed. Dizziness   Yes Historical Provider, MD  metoprolol tartrate (LOPRESSOR) 25 MG tablet Take 25 mg by mouth 2 (two) times daily.  04/23/11  Yes Dayna N Dunn, PA-C  nitroGLYCERIN (NITROSTAT) 0.4 MG SL tablet Place 0.4 mg under the tongue every 5 (five) minutes x 3 doses as needed. For chest pain. 04/23/11  Yes Dayna N Dunn, PA-C  ondansetron (ZOFRAN-ODT) 4 MG  disintegrating tablet Take 4 mg by mouth every 8 (eight) hours as needed for nausea or vomiting.   Yes Historical Provider, MD  RANITIDINE HCL PO Take 75 mg by mouth 2 (two) times daily.    Yes Historical Provider, MD  traMADol (ULTRAM) 50 MG tablet Take 50 mg by mouth 2 (two) times daily.  04/26/14  Yes Historical Provider, MD  Vitamin D, Ergocalciferol, (DRISDOL) 50000 UNITS CAPS Take 50,000 Units by mouth 2 (two) times a week. Tuesdays and Thurdays 05/30/12  Yes Historical Provider, MD   Physical Exam: Filed Vitals:   11/05/14 1135 11/05/14 1429 11/05/14 1452  BP: 122/39 125/43 120/47  Pulse: 49 56 56  Temp: 97.6 F (36.4 C) 98.1 F (36.7 C) 98 F (36.7 C)  TempSrc: Oral Oral   Resp: 17 16 18   Height: 5\' 2"  (1.575 m)  5\' 2"  (1.575 m)  Weight: 68.04 kg (150 lb)  73.392 kg (161 lb 12.8 oz)  SpO2: 99% 100% 100%    Wt Readings from Last 3 Encounters:  11/05/14 73.392 kg (161 lb 12.8 oz)  09/14/14 71.782 kg (158 lb 4 oz)  07/25/14 76.567 kg (168 lb 12.8 oz)    General:  Appears calm and comfortable Eyes: PERRL, normal lids, irises & conjunctiva ENT: grossly normal hearing, lips & tongue Neck: no LAD, masses or thyromegaly Cardiovascular: RRR, no m/r/g. No LE edema. Telemetry: SR, no arrhythmias  Respiratory: CTA bilaterally, no w/r/r. Normal respiratory effort. Abdomen: soft, ntnd Skin: no rash or induration seen on limited exam Musculoskeletal: grossly normal tone BUE/BLE Psychiatric: grossly normal mood and affect, speech fluent and appropriate Neurologic: grossly non-focal.          Labs on Admission:  Basic Metabolic Panel:  Recent Labs Lab 11/05/14 1235  NA 130*  K 4.5  CL 99*  CO2 25  GLUCOSE 141*  BUN 19  CREATININE 2.04*  CALCIUM 9.1   Liver Function Tests:  Recent Labs Lab 11/05/14 1235  AST 18  ALT 8*  ALKPHOS 59  BILITOT 0.8  PROT 6.5  ALBUMIN 3.4*   No results for input(s): LIPASE, AMYLASE in the last 168 hours. No results for input(s):  AMMONIA in the last 168 hours. CBC:  Recent Labs Lab 11/05/14 1235  WBC 6.5  NEUTROABS 4.9  HGB 11.0*  HCT 31.9*  MCV 91.1  PLT 175   Cardiac Enzymes: No results for input(s): CKTOTAL, CKMB, CKMBINDEX, TROPONINI in the last 168 hours.  BNP (last 3 results)  Recent Labs  07/26/14 0557  BNP 155.0*    ProBNP (last 3 results)  Recent Labs  12/15/13 1245  PROBNP 2030.0*    CBG: No results for input(s): GLUCAP in the last 168 hours.  Radiological Exams on Admission: Dg Abd Acute W/chest  11/05/2014  CLINICAL DATA:  Constipation, rectal bleeding EXAM: DG ABDOMEN ACUTE W/ 1V CHEST COMPARISON:  None. FINDINGS: There is no evidence of dilated bowel loops or free intraperitoneal air. No radiopaque calculi or other significant radiographic abnormality  is seen. Heart size and mediastinal contours are within normal limits. Both lungs are clear. Mild osteoarthritis of the left hip. Degenerative changes of the lower lumbar spine. IMPRESSION: Negative abdominal radiographs.  No acute cardiopulmonary disease. Electronically Signed   By: Kathreen Devoid   On: 11/05/2014 13:16    Assessment/Plan Active Problems:   AKI (acute kidney injury) ([place])   CKD (chronic kidney disease), stage III   Hyponatremia   Essential hypertension   Rectal bleeding   Absolute anemia   Hypothyroidism   1. Rectal bleeding. I suspect this may be related to constipation and hemorrhoids. Will follow patient's CBC. Currently hemoglobin is 11 which is close to her baseline, although she might be slightly hemoconcentrated. We'll start the patient on Anusol suppositories. Will request gastroenterology consultation to see if colonoscopy is necessary. Keep patient on clear liquids for now and nothing by mouth after midnight. 2. Hypertension. Appears stable. Continue current treatments. 3. Hyponatremia, chronic. Currently sodium is 130, which is near her baseline. We'll continue to monitor. 4. Acute kidney injury on  chronic kidney disease stage III. Creatinine is above baseline. Family does report decreased by mouth intake. We'll provide gentle hydration 5. Hypothyroidism. Continue Synthroid. 6. Anemia, likely related to chronic disease. Will observe for any decline in hemoglobin by acute blood loss. 7. Arthritis. Patient has been advised to discontinue further NSAIDs. Recommend to continue tramadol for now.   Code Status: full code DVT Prophylaxis: scd Family Communication: discussed with daughter at the bedside Disposition Plan: discharge home once improved  Time spent: 28mins  Swannie Milius [facility] Pager [phone]

## 2014-11-06 DIAGNOSIS — E1121 Type 2 diabetes mellitus with diabetic nephropathy: Secondary | ICD-10-CM

## 2014-11-06 LAB — CBC
HEMATOCRIT: 29.6 % — AB (ref 36.0–46.0)
HEMOGLOBIN: 10 g/dL — AB (ref 12.0–15.0)
MCH: 31 pg (ref 26.0–34.0)
MCHC: 33.8 g/dL (ref 30.0–36.0)
MCV: 91.6 fL (ref 78.0–100.0)
PLATELETS: 178 10*3/uL (ref 150–400)
RBC: 3.23 MIL/uL — AB (ref 3.87–5.11)
RDW: 12.4 % (ref 11.5–15.5)
WBC: 4.5 10*3/uL (ref 4.0–10.5)

## 2014-11-06 LAB — BASIC METABOLIC PANEL
ANION GAP: 4 — AB (ref 5–15)
BUN: 14 mg/dL (ref 6–20)
CHLORIDE: 106 mmol/L (ref 101–111)
CO2: 24 mmol/L (ref 22–32)
Calcium: 8.7 mg/dL — ABNORMAL LOW (ref 8.9–10.3)
Creatinine, Ser: 1.72 mg/dL — ABNORMAL HIGH (ref 0.44–1.00)
GFR calc Af Amer: 31 mL/min — ABNORMAL LOW (ref >60)
GFR, EST NON AFRICAN AMERICAN: 27 mL/min — AB (ref >60)
GLUCOSE: 113 mg/dL — AB (ref 65–99)
POTASSIUM: 4.2 mmol/L (ref 3.5–5.1)
Sodium: 134 mmol/L — ABNORMAL LOW (ref 135–145)

## 2014-11-06 MED ORDER — POLYETHYLENE GLYCOL 3350 17 G PO PACK: 17.0000 g | PACK | Freq: Every day | ORAL | Status: DC | PRN

## 2014-11-06 MED ORDER — HYDROCORTISONE ACETATE 25 MG RE SUPP: 25.0000 mg | Freq: Two times a day (BID) | RECTAL | Status: DC

## 2014-11-06 MED ORDER — PANTOPRAZOLE SODIUM 40 MG PO TBEC: 40.0000 mg | DELAYED_RELEASE_TABLET | Freq: Every day | ORAL | Status: DC

## 2014-11-06 MED ORDER — DOCUSATE SODIUM 100 MG PO CAPS: 100.0000 mg | ORAL_CAPSULE | Freq: Two times a day (BID) | ORAL | Status: DC

## 2014-11-06 NOTE — Care Management Note (Signed)
Case Management Note  Patient Details  Name: Joann Hunter MRN: [id number] Date of Birth: 1932/06/15  Subjective/Objective:                   Pt is from home, lives with her daughter and grandson. Pt has walker but does not use it. Pt is ind with ADL's. Pt has no HH services or needs prior to admission.   Action/Plan: Pt discharged home today. No CM needs at DC.   Expected Discharge Date:    11/06/2014              Expected Discharge Plan:  Home/Self Care  In-House Referral:  NA  Discharge planning Services  CM Consult  Post Acute Care Choice:  NA Choice offered to:  NA  DME Arranged:    DME Agency:     HH Arranged:    HH Agency:     Status of Service:  Completed, signed off  Medicare Important Message Given:    Date Medicare IM Given:    Medicare IM give by:    Date Additional Medicare IM Given:    Additional Medicare Important Message give by:     If discussed at [place] of Stay Meetings, dates discussed:    Additional Comments:  Sherald Barge, RN 11/06/2014, 12:34 PM

## 2014-11-06 NOTE — Progress Notes (Signed)
Patient alert and oriented, independent, VSS, pt. Tolerating diet well. No complaints of pain or nausea. Pt. Had IV removed tip intact. Pt. Had prescriptions given. Pt. Voiced understanding of discharge instructions with no further questions. Pt. Discharged via wheelchair with auxilliary.  

## 2014-11-06 NOTE — Discharge Summary (Signed)
Physician Discharge Summary  Joann Hunter [id number] DOB: March 17, 1932 DOA: 11/05/2014  PCP: Monico Blitz, MD  Admit date: 11/05/2014 Discharge date: 11/06/2014  Time spent: 35 minutes  Recommendations for Outpatient Follow-up:  1. Follow up with Dr. Laural Golden, GI, as outpatient.   Discharge Diagnoses:  Active Problems:   AKI (acute kidney injury) ([place])   CKD (chronic kidney disease), stage III   Hyponatremia   Type 2 diabetes with nephropathy (HCC)   Essential hypertension   Rectal bleeding   Absolute anemia   Hypothyroidism   Discharge Condition: Improved   Diet recommendation: High fiber and heart healthy   Filed Weights   11/05/14 1135 11/05/14 1452  Weight: 68.04 kg (150 lb) 73.392 kg (161 lb 12.8 oz)    History of present illness:  81 y.o. female who presents to the hospital with complaints of rectal bleeding. According to patient's daughter, she had been constipated the past several days. On 10/31 she had taken a laxative to help her move her bowels. Yesterday she reportedly had 2 bloody bowel movements containing fresh blood. Per ER notes, bowel movements were reported to be black with dark red coloring, but daughter reports that bowel movements were bright red blood. She's had several episodes prior to admission. She denies any chest pain or shortness of breath. Reports that she has felt lightheaded lately. Her by mouth intake has been poor. She also admits to taking daily ibuprofen for arthritis. She cannot recall the last time that she had any endoscopic evaluation. She's been referred for observation.  Hospital Course:  I suspect her rectal beeding may be related to constipation and hemorrhoids. Started on Anusol suppositories, with no recurrence of bloody stools. Hgb remained at baseline and there were no obvious signs of bleeding during hospitalization. Case was discussed with Dr. Laural Golden, GI, who agrees to see that patient as an outpatient. She has been advised to  discontinue any further NSAID, and has been started on a PPI.   1. Hypertension. Remained stable. Continue current treatments. 2. Hyponatremia, chronic. Sodium remained near her baseline. Continue to monitor.  3. Acute kidney injury on chronic kidney disease stage III. Creatinine is trending down towards baseline. Continue to monitor. 4. Hypothyroidism. Continue Synthroid. 5. Anemia, likely related to chronic disease. Hgb is at baseline. She did not have any significant decline during her hospitalization . 6.  Arthritis. Patient has been advised to discontinue further NSAIDs. Recommend to continue tramadol for now.  Procedures:  None   Consultations:  None  Discharge Exam: Filed Vitals:   11/06/14 0525  BP: 147/50  Pulse: 59  Temp: 98.9 F (37.2 C)  Resp: 20     General: NAD, looks comfortable, lying in bed.   Cardiovascular: RRR, S1, S2   Respiratory: clear bilaterally, No wheezing, rales or rhonchi  Abdomen: soft, non tender, no distention , bowel sounds normal.   Musculoskeletal: No edema b/l  Discharge Instructions   Discharge Instructions    Diet - low sodium heart healthy    Complete by:  As directed      Increase activity slowly    Complete by:  As directed           Current Discharge Medication List    START taking these medications   Details  docusate sodium (COLACE) 100 MG capsule Take 1 capsule (100 mg total) by mouth 2 (two) times daily. Qty: 60 capsule, Refills: 0    hydrocortisone (ANUSOL-HC) 25 MG suppository Place 1 suppository (25 mg total) rectally  2 (two) times daily. Qty: 12 suppository, Refills: 0    pantoprazole (PROTONIX) 40 MG tablet Take 1 tablet (40 mg total) by mouth daily. Qty: 30 tablet, Refills: 0    polyethylene glycol (MIRALAX) packet Take 17 g by mouth daily as needed for mild constipation. Qty: 14 each, Refills: 0      CONTINUE these medications which have NOT CHANGED   Details  amLODipine (NORVASC) 10 MG tablet  Take 0.5 tablets (5 mg total) by mouth daily.    aspirin 81 MG chewable tablet Chew 81 mg by mouth daily.     fish oil-omega-3 fatty acids 1000 MG capsule Take 1 g by mouth daily.    levothyroxine (SYNTHROID, LEVOTHROID) 112 MCG tablet Take 1 tablet (112 mcg total) by mouth every morning. Qty: 30 tablet, Refills: 0    meclizine (ANTIVERT) 25 MG tablet Take 25 mg by mouth 4 (four) times daily as needed. Dizziness    metoprolol tartrate (LOPRESSOR) 25 MG tablet Take 25 mg by mouth 2 (two) times daily.     nitroGLYCERIN (NITROSTAT) 0.4 MG SL tablet Place 0.4 mg under the tongue every 5 (five) minutes x 3 doses as needed. For chest pain.    ondansetron (ZOFRAN-ODT) 4 MG disintegrating tablet Take 4 mg by mouth every 8 (eight) hours as needed for nausea or vomiting.    RANITIDINE HCL PO Take 75 mg by mouth 2 (two) times daily.     traMADol (ULTRAM) 50 MG tablet Take 50 mg by mouth 2 (two) times daily.     Vitamin D, Ergocalciferol, (DRISDOL) 50000 UNITS CAPS Take 50,000 Units by mouth 2 (two) times a week. Tuesdays and Thurdays       Allergies  Allergen Reactions  . Ciprofloxacin Other (See Comments)    Hallucination  . Lipitor [Atorvastatin] Other (See Comments)    Muscle Weakness  . Penicillins Rash      The results of significant diagnostics from this hospitalization (including imaging, microbiology, ancillary and laboratory) are listed below for reference.    Significant Diagnostic Studies: Dg Abd Acute W/chest  11/05/2014  CLINICAL DATA:  Constipation, rectal bleeding EXAM: DG ABDOMEN ACUTE W/ 1V CHEST COMPARISON:  None. FINDINGS: There is no evidence of dilated bowel loops or free intraperitoneal air. No radiopaque calculi or other significant radiographic abnormality is seen. Heart size and mediastinal contours are within normal limits. Both lungs are clear. Mild osteoarthritis of the left hip. Degenerative changes of the lower lumbar spine. IMPRESSION: Negative abdominal  radiographs.  No acute cardiopulmonary disease. Electronically Signed   By: Kathreen Devoid   On: 11/05/2014 13:16    Labs: Basic Metabolic Panel:  Recent Labs Lab 11/05/14 1235 11/06/14 0610  NA 130* 134*  K 4.5 4.2  CL 99* 106  CO2 25 24  GLUCOSE 141* 113*  BUN 19 14  CREATININE 2.04* 1.72*  CALCIUM 9.1 8.7*   Liver Function Tests:  Recent Labs Lab 11/05/14 1235  AST 18  ALT 8*  ALKPHOS 59  BILITOT 0.8  PROT 6.5  ALBUMIN 3.4*   CBC:  Recent Labs Lab 11/05/14 1235 11/06/14 0610  WBC 6.5 4.5  NEUTROABS 4.9  --   HGB 11.0* 10.0*  HCT 31.9* 29.6*  MCV 91.1 91.6  PLT 175 178   BNP: BNP (last 3 results)  Recent Labs  07/26/14 0557  BNP 155.0*    ProBNP (last 3 results)  Recent Labs  12/15/13 1245  PROBNP 2030.0*     Signed:  Saim Almanza,  Jolaine Artist, MD  [facility] 11/06/2014, 11:59 AM   By signing my name below, I, Rennis Harding, attest that this documentation has been prepared under the direction and in the presence of Kathie Dike, MD. Electronically signed: Rennis Harding, Scribe. 11/06/2014 11:39am   I, Dr. Kathie Dike, personally performed the services described in this documentaiton. All medical record entries made by the scribe were at my direction and in my presence. I have reviewed the chart and agree that the record reflects my personal performance and is accurate and complete  Kathie Dike, MD, 11/06/2014 11:59 AM

## 2015-07-11 ENCOUNTER — Encounter (HOSPITAL_COMMUNITY): Payer: Self-pay | Admitting: Emergency Medicine

## 2015-07-11 ENCOUNTER — Emergency Department (HOSPITAL_COMMUNITY)
Admission: EM | Admit: 2015-07-11 | Discharge: 2015-07-11 | Disposition: A | Discharge status: Home / Self Care | Payer: Medicare Other | Attending: Emergency Medicine | Admitting: Emergency Medicine

## 2015-07-11 DIAGNOSIS — E860 Dehydration: Secondary | ICD-10-CM

## 2015-07-11 DIAGNOSIS — M1990 Unspecified osteoarthritis, unspecified site: Secondary | ICD-10-CM | POA: Diagnosis not present

## 2015-07-11 DIAGNOSIS — E785 Hyperlipidemia, unspecified: Secondary | ICD-10-CM | POA: Insufficient documentation

## 2015-07-11 DIAGNOSIS — N390 Urinary tract infection, site not specified: Secondary | ICD-10-CM

## 2015-07-11 DIAGNOSIS — R42 Dizziness and giddiness: Secondary | ICD-10-CM | POA: Diagnosis present

## 2015-07-11 DIAGNOSIS — E039 Hypothyroidism, unspecified: Secondary | ICD-10-CM | POA: Diagnosis not present

## 2015-07-11 DIAGNOSIS — I252 Old myocardial infarction: Secondary | ICD-10-CM | POA: Diagnosis not present

## 2015-07-11 DIAGNOSIS — Z79899 Other long term (current) drug therapy: Secondary | ICD-10-CM | POA: Insufficient documentation

## 2015-07-11 DIAGNOSIS — Z7982 Long term (current) use of aspirin: Secondary | ICD-10-CM | POA: Insufficient documentation

## 2015-07-11 DIAGNOSIS — I2510 Atherosclerotic heart disease of native coronary artery without angina pectoris: Secondary | ICD-10-CM | POA: Insufficient documentation

## 2015-07-11 DIAGNOSIS — E119 Type 2 diabetes mellitus without complications: Secondary | ICD-10-CM | POA: Diagnosis not present

## 2015-07-11 LAB — CBC WITH DIFFERENTIAL/PLATELET
BASOS ABS: 0 10*3/uL (ref 0.0–0.1)
Basophils Relative: 1 %
EOS ABS: 0.1 10*3/uL (ref 0.0–0.7)
Eosinophils Relative: 3 %
HCT: 35.3 % — ABNORMAL LOW (ref 36.0–46.0)
HEMOGLOBIN: 11.9 g/dL — AB (ref 12.0–15.0)
LYMPHS ABS: 1.4 10*3/uL (ref 0.7–4.0)
Lymphocytes Relative: 30 %
MCH: 30.8 pg (ref 26.0–34.0)
MCHC: 33.7 g/dL (ref 30.0–36.0)
MCV: 91.5 fL (ref 78.0–100.0)
Monocytes Absolute: 0.4 10*3/uL (ref 0.1–1.0)
Monocytes Relative: 9 %
NEUTROS PCT: 57 %
Neutro Abs: 2.6 10*3/uL (ref 1.7–7.7)
PLATELETS: 180 10*3/uL (ref 150–400)
RBC: 3.86 MIL/uL — AB (ref 3.87–5.11)
RDW: 13.8 % (ref 11.5–15.5)
WBC: 4.5 10*3/uL (ref 4.0–10.5)

## 2015-07-11 LAB — COMPREHENSIVE METABOLIC PANEL
ALBUMIN: 3.5 g/dL (ref 3.5–5.0)
ALT: 10 U/L — ABNORMAL LOW (ref 14–54)
AST: 16 U/L (ref 15–41)
Alkaline Phosphatase: 70 U/L (ref 38–126)
Anion gap: 6 (ref 5–15)
BILIRUBIN TOTAL: 0.5 mg/dL (ref 0.3–1.2)
BUN: 30 mg/dL — ABNORMAL HIGH (ref 6–20)
CHLORIDE: 102 mmol/L (ref 101–111)
CO2: 22 mmol/L (ref 22–32)
Calcium: 8.9 mg/dL (ref 8.9–10.3)
Creatinine, Ser: 2.49 mg/dL — ABNORMAL HIGH (ref 0.44–1.00)
GFR, EST AFRICAN AMERICAN: 20 mL/min — AB (ref >60)
GFR, EST NON AFRICAN AMERICAN: 17 mL/min — AB (ref >60)
Glucose, Bld: 129 mg/dL — ABNORMAL HIGH (ref 65–99)
POTASSIUM: 4.5 mmol/L (ref 3.5–5.1)
SODIUM: 130 mmol/L — AB (ref 135–145)
TOTAL PROTEIN: 6.8 g/dL (ref 6.5–8.1)

## 2015-07-11 LAB — TROPONIN I

## 2015-07-11 LAB — URINE MICROSCOPIC-ADD ON

## 2015-07-11 LAB — URINALYSIS, ROUTINE W REFLEX MICROSCOPIC
BILIRUBIN URINE: NEGATIVE
Glucose, UA: NEGATIVE mg/dL
Ketones, ur: NEGATIVE mg/dL
Nitrite: POSITIVE — AB
PH: 6 (ref 5.0–8.0)
SPECIFIC GRAVITY, URINE: 1.025 (ref 1.005–1.030)

## 2015-07-11 LAB — CBG MONITORING, ED: Glucose-Capillary: 127 mg/dL — ABNORMAL HIGH (ref 65–99)

## 2015-07-11 MED ORDER — CEPHALEXIN 500 MG PO CAPS
ORAL_CAPSULE | ORAL | Status: DC
Start: 2015-07-11 — End: 2015-09-04

## 2015-07-11 MED ORDER — SODIUM CHLORIDE 0.9 % IV BOLUS (SEPSIS)
1000.0000 mL | Freq: Once | INTRAVENOUS | Status: AC
  Administered 2015-07-11: 1000 mL via INTRAVENOUS

## 2015-07-11 MED ORDER — DEXTROSE 5 % IV SOLN
1.0000 g | Freq: Once | INTRAVENOUS | Status: AC
  Administered 2015-07-11: 1 g via INTRAVENOUS
  Filled 2015-07-11: qty 10

## 2015-07-11 MED ORDER — SODIUM CHLORIDE 0.9 % IV BOLUS (SEPSIS)
500.0000 mL | Freq: Once | INTRAVENOUS | Status: AC
  Administered 2015-07-11: 500 mL via INTRAVENOUS

## 2015-07-11 NOTE — ED Notes (Signed)
Patient ambulatory to restroom with steady gait. Urine specimen obtained.

## 2015-07-11 NOTE — ED Notes (Signed)
Per patient's daughter: patient is being treated for UTI and has finished antibiotics. Patient states feeling weak and dizzy.

## 2015-07-11 NOTE — ED Provider Notes (Signed)
CSN: [id number]     Arrival date & time 07/11/15  1531 History   First MD Initiated Contact with Patient 07/11/15 1552     Chief Complaint  Patient presents with  . Dizziness     (Consider location/radiation/quality/duration/timing/severity/associated sxs/prior Treatment) Patient is a 82 y.o. female presenting with weakness.  Weakness This is a recurrent problem. The current episode started 2 days ago. The problem occurs constantly. The problem has been gradually worsening. Pertinent negatives include no chest pain, no headaches and no shortness of breath. Nothing aggravates the symptoms. Nothing relieves the symptoms. She has tried nothing for the symptoms.    Past Medical History  Diagnosis Date  . Hypothyroidism   . CAD (coronary artery disease)     NSTEMI 04/2011 with newly diagnosed 3V CAD s/p PCI/DES mLAD 04/11/11 with residual dz (per Dr. Burt Knack, should have consideration of PCI of the occluded RCA based on symptoms and/or consideration of outpatient nuclear perfusion testing after the patient recovers from her infarct)  . Ischemic cardiomyopathy     EF 45% by cath 04/20/11, 50-55% by echo 04/22/11. hypotension limiting medication titration   . Hyperlipidemia   . Hyperglycemia   . Hypertension   . Back pain, chronic   . Myocardial infarction ([place]) 2013  . UTI (urinary tract infection) 12/13/2013  . Arthritis   . Type 2 diabetes with nephropathy ([place]) 07/26/2014   Past Surgical History  Procedure Laterality Date  . Breast lumpectomy      right  . Coronary stent placement    . Back surgery    . Left heart catheterization with coronary angiogram N/A 04/20/2011    Procedure: LEFT HEART CATHETERIZATION WITH CORONARY ANGIOGRAM;  Surgeon: Burnell Blanks, MD;  Location: [facility] CATH LAB;  Service: Cardiovascular;  Laterality: N/A;  . Percutaneous coronary stent intervention (pci-s) N/A 04/21/2011    Procedure: PERCUTANEOUS CORONARY STENT INTERVENTION (PCI-S);  Surgeon: Sherren Mocha, MD;  Location: [facility] CATH LAB;  Service: Cardiovascular;  Laterality: N/A;   Family History  Problem Relation Age of Onset  . Other      no known family cardiac disease   Social History  Substance Use Topics  . Smoking status: Never Smoker   . Smokeless tobacco: Never Used  . Alcohol Use: No   OB History    Gravida Para Term Preterm AB TAB SAB Ectopic Multiple Living   2 2 2       2      Review of Systems  Constitutional: Positive for appetite change and fatigue.  Respiratory: Negative for cough and shortness of breath.   Cardiovascular: Negative for chest pain.  Genitourinary: Positive for dysuria.  Neurological: Positive for dizziness (feeling weak and may fall) and weakness (all over). Negative for headaches.  All other systems reviewed and are negative.     Allergies  Ciprofloxacin; Lipitor; and Penicillins  Home Medications   Prior to Admission medications   Medication Sig Start Date End Date Taking? Authorizing Provider  amLODipine (NORVASC) 10 MG tablet Take 0.5 tablets (5 mg total) by mouth daily. 09/18/14   Samuella Cota, MD  aspirin 81 MG chewable tablet Chew 81 mg by mouth daily.     Historical Provider, MD  docusate sodium (COLACE) 100 MG capsule Take 1 capsule (100 mg total) by mouth 2 (two) times daily. 11/06/14   Kathie Dike, MD  fish oil-omega-3 fatty acids 1000 MG capsule Take 1 g by mouth daily.    Historical Provider, MD  hydrocortisone (ANUSOL-HC)  25 MG suppository Place 1 suppository (25 mg total) rectally 2 (two) times daily. 11/06/14   Kathie Dike, MD  levothyroxine (SYNTHROID, LEVOTHROID) 112 MCG tablet Take 1 tablet (112 mcg total) by mouth every morning. 12/16/13   Verlee Monte, MD  meclizine (ANTIVERT) 25 MG tablet Take 25 mg by mouth 4 (four) times daily as needed. Dizziness    Historical Provider, MD  metoprolol tartrate (LOPRESSOR) 25 MG tablet Take 25 mg by mouth 2 (two) times daily.  04/23/11   Dayna N Dunn, PA-C  nitroGLYCERIN  (NITROSTAT) 0.4 MG SL tablet Place 0.4 mg under the tongue every 5 (five) minutes x 3 doses as needed. For chest pain. 04/23/11   Dayna N Dunn, PA-C  ondansetron (ZOFRAN-ODT) 4 MG disintegrating tablet Take 4 mg by mouth every 8 (eight) hours as needed for nausea or vomiting.    Historical Provider, MD  pantoprazole (PROTONIX) 40 MG tablet Take 1 tablet (40 mg total) by mouth daily. 11/06/14   Kathie Dike, MD  polyethylene glycol (MIRALAX) packet Take 17 g by mouth daily as needed for mild constipation. 11/06/14   Kathie Dike, MD  RANITIDINE HCL PO Take 75 mg by mouth 2 (two) times daily.     Historical Provider, MD  traMADol (ULTRAM) 50 MG tablet Take 50 mg by mouth 2 (two) times daily.  04/26/14   Historical Provider, MD  Vitamin D, Ergocalciferol, (DRISDOL) 50000 UNITS CAPS Take 50,000 Units by mouth 2 (two) times a week. Tuesdays and Thurdays 05/30/12   Historical Provider, MD   BP 125/59 mmHg  Pulse 60  Temp(Src) 98.6 F (37 C) (Oral)  Resp 16  Ht 5\' 3"  (1.6 m)  SpO2 100% Physical Exam  Constitutional: She appears well-developed and well-nourished.  HENT:  Head: Normocephalic and atraumatic.  Eyes: Pupils are equal, round, and reactive to light.  Neck: Normal range of motion.  Cardiovascular: Normal rate and regular rhythm.   Pulmonary/Chest: Effort normal and breath sounds normal. No stridor. No respiratory distress.  Abdominal: Soft. She exhibits no distension. There is no tenderness.  Musculoskeletal: She exhibits no edema or tenderness.  Neurological: She is alert.  Skin: Skin is warm and dry.  Nursing note and vitals reviewed.   ED Course  Procedures (including critical care time) Labs Review Labs Reviewed  CBC WITH DIFFERENTIAL/PLATELET - Abnormal; Notable for the following:    RBC 3.86 (*)    Hemoglobin 11.9 (*)    HCT 35.3 (*)    All other components within normal limits  CBG MONITORING, ED - Abnormal; Notable for the following:    Glucose-Capillary 127 (*)     All other components within normal limits  COMPREHENSIVE METABOLIC PANEL  TROPONIN I  URINALYSIS, ROUTINE W REFLEX MICROSCOPIC (NOT AT [facility])    Imaging Review No results found. I have personally reviewed and evaluated these images and lab results as part of my medical decision-making.   EKG Interpretation   Date/Time:  Saturday July 11 2015 15:58:24 EDT Ventricular Rate:  54 PR Interval:    QRS Duration: 93 QT Interval:  461 QTC Calculation: 437 R Axis:   21 Text Interpretation:  Sinus rhythm Borderline prolonged PR interval Low  voltage, precordial leads Minimal ST depression, lateral leads new since   Confirmed by Katarzyna Wolven MD, Corene Cornea [phone]) on 07/11/2015 4:05:28 PM      MDM   Final diagnoses:  UTI (lower urinary tract infection)  Dehydration   82 year old female here with slight dehydration and urinary tract infection.  After a couple liters of fluid and antibiotics patient dizziness resolved and she was able daily without any difficulty and felt like she was at her baseline. I'll discharge her on Rocephin and asked to follow-up with her primary doctor to follow-up the cultures to make sure she is on appropriate antibiotic.  New Prescriptions: Discharge Medication List as of 07/11/2015  8:26 PM    START taking these medications   Details  cephALEXin (KEFLEX) 500 MG capsule 2 caps po bid x 7 days, Print         I have personally and contemperaneously reviewed labs and imaging and used in my decision making as above.   A medical screening exam was performed and I feel the patient has had an appropriate workup for their chief complaint at this time and likelihood of emergent condition existing is low and thus workup can continue on an outpatient basis.. Their vital signs are stable. They have been counseled on decision, discharge, follow up and which symptoms necessitate immediate return to the emergency department.  They verbally stated understanding and agreement with plan  and discharged in stable condition.      Merrily Pew, MD 07/12/15 [phone]

## 2015-07-17 ENCOUNTER — Telehealth: Payer: Self-pay | Admitting: *Deleted

## 2015-07-17 ENCOUNTER — Emergency Department (HOSPITAL_COMMUNITY)
Admission: EM | Admit: 2015-07-17 | Discharge: 2015-07-17 | Disposition: A | Discharge status: Home / Self Care | Payer: Medicare Other | Attending: Emergency Medicine | Admitting: Emergency Medicine

## 2015-07-17 ENCOUNTER — Encounter (HOSPITAL_COMMUNITY): Payer: Self-pay | Admitting: Emergency Medicine

## 2015-07-17 DIAGNOSIS — M1990 Unspecified osteoarthritis, unspecified site: Secondary | ICD-10-CM | POA: Insufficient documentation

## 2015-07-17 DIAGNOSIS — E785 Hyperlipidemia, unspecified: Secondary | ICD-10-CM | POA: Diagnosis not present

## 2015-07-17 DIAGNOSIS — I252 Old myocardial infarction: Secondary | ICD-10-CM | POA: Diagnosis not present

## 2015-07-17 DIAGNOSIS — B961 Klebsiella pneumoniae [K. pneumoniae] as the cause of diseases classified elsewhere: Secondary | ICD-10-CM | POA: Insufficient documentation

## 2015-07-17 DIAGNOSIS — E119 Type 2 diabetes mellitus without complications: Secondary | ICD-10-CM | POA: Insufficient documentation

## 2015-07-17 DIAGNOSIS — I10 Essential (primary) hypertension: Secondary | ICD-10-CM | POA: Insufficient documentation

## 2015-07-17 DIAGNOSIS — I2510 Atherosclerotic heart disease of native coronary artery without angina pectoris: Secondary | ICD-10-CM | POA: Diagnosis not present

## 2015-07-17 DIAGNOSIS — R799 Abnormal finding of blood chemistry, unspecified: Secondary | ICD-10-CM | POA: Insufficient documentation

## 2015-07-17 DIAGNOSIS — E039 Hypothyroidism, unspecified: Secondary | ICD-10-CM | POA: Diagnosis not present

## 2015-07-17 LAB — URINALYSIS, ROUTINE W REFLEX MICROSCOPIC
BILIRUBIN URINE: NEGATIVE
GLUCOSE, UA: NEGATIVE mg/dL
Ketones, ur: NEGATIVE mg/dL
Nitrite: POSITIVE — AB
PH: 6 (ref 5.0–8.0)
SPECIFIC GRAVITY, URINE: 1.02 (ref 1.005–1.030)

## 2015-07-17 LAB — URINE MICROSCOPIC-ADD ON

## 2015-07-17 LAB — URINE CULTURE

## 2015-07-17 MED ORDER — NITROFURANTOIN MONOHYD MACRO 100 MG PO CAPS: 100.0000 mg | ORAL_CAPSULE | Freq: Two times a day (BID) | ORAL | Status: DC

## 2015-07-17 NOTE — ED Provider Notes (Signed)
CSN: [id number]     Arrival date & time 07/17/15  1519 History   First MD Initiated Contact with Patient 07/17/15 1529     Chief Complaint  Patient presents with  . Abnormal Lab     (Consider location/radiation/quality/duration/timing/severity/associated sxs/prior Treatment) HPI.Marland KitchenMarland KitchenMarland KitchenPatient returns to the emergency department after patient was called in regards to an abnormal urine culture from 07/12/15.  She is feeling much better. She is eating and ambulatory. No fever, dysuria, illness, flank pain, chest pain, dyspnea.  Daughter says she is back to normal.  Past Medical History  Diagnosis Date  . Hypothyroidism   . CAD (coronary artery disease)     NSTEMI 04/2011 with newly diagnosed 3V CAD s/p PCI/DES mLAD 04/11/11 with residual dz (per Dr. Burt Knack, should have consideration of PCI of the occluded RCA based on symptoms and/or consideration of outpatient nuclear perfusion testing after the patient recovers from her infarct)  . Ischemic cardiomyopathy     EF 45% by cath 04/20/11, 50-55% by echo 04/22/11. hypotension limiting medication titration   . Hyperlipidemia   . Hyperglycemia   . Hypertension   . Back pain, chronic   . Myocardial infarction ([place]) 2013  . UTI (urinary tract infection) 12/13/2013  . Arthritis   . Type 2 diabetes with nephropathy ([place]) 07/26/2014   Past Surgical History  Procedure Laterality Date  . Breast lumpectomy      right  . Coronary stent placement    . Back surgery    . Left heart catheterization with coronary angiogram N/A 04/20/2011    Procedure: LEFT HEART CATHETERIZATION WITH CORONARY ANGIOGRAM;  Surgeon: Burnell Blanks, MD;  Location[facility] CATH LAB;  Service: Cardiovascular;  Laterality: N/A;  . Percutaneous coronary stent intervention (pci-s) N/A 04/21/2011    Procedure: PERCUTANEOUS CORONARY STENT INTERVENTION (PCI-S);  Surgeon: Sherren Mocha, MD;  Location: [facility] CATH LAB;  Service: Cardiovascular;  Laterality: N/A;   Family History  Problem  Relation Age of Onset  . Other      no known family cardiac disease   Social History  Substance Use Topics  . Smoking status: Never Smoker   . Smokeless tobacco: Never Used  . Alcohol Use: No   OB History    Gravida Para Term Preterm AB TAB SAB Ectopic Multiple Living   2 2 2       2      Review of Systems  All other systems reviewed and are negative.     Allergies  Ciprofloxacin; Lipitor; and Penicillins  Home Medications   Prior to Admission medications   Medication Sig Start Date End Date Taking? Authorizing Provider  amLODipine (NORVASC) 10 MG tablet Take 0.5 tablets (5 mg total) by mouth daily. 09/18/14  Yes Samuella Cota, MD  aspirin 81 MG chewable tablet Chew 81 mg by mouth daily.    Yes Historical Provider, MD  cephALEXin (KEFLEX) 500 MG capsule 2 caps po bid x 7 days Patient taking differently: Take 500 mg by mouth 2 (two) times daily. 2 caps po bid x 7 days 07/11/15  Yes Merrily Pew, MD  docusate sodium (COLACE) 100 MG capsule Take 1 capsule (100 mg total) by mouth 2 (two) times daily. 11/06/14  Yes Kathie Dike, MD  fish oil-omega-3 fatty acids 1000 MG capsule Take 1 g by mouth daily.   Yes Historical Provider, MD  levothyroxine (SYNTHROID, LEVOTHROID) 112 MCG tablet Take 1 tablet (112 mcg total) by mouth every morning. 12/16/13  Yes Verlee Monte, MD  meclizine Johnathan Hausen)  25 MG tablet Take 25 mg by mouth 4 (four) times daily as needed. Dizziness   Yes Historical Provider, MD  metoprolol tartrate (LOPRESSOR) 25 MG tablet Take 25 mg by mouth 2 (two) times daily.  04/23/11  Yes Dayna N Dunn, PA-C  nitroGLYCERIN (NITROSTAT) 0.4 MG SL tablet Place 0.4 mg under the tongue every 5 (five) minutes x 3 doses as needed. For chest pain. 04/23/11  Yes Dayna N Dunn, PA-C  ondansetron (ZOFRAN-ODT) 4 MG disintegrating tablet Take 4 mg by mouth every 8 (eight) hours as needed for nausea or vomiting.   Yes Historical Provider, MD  pantoprazole (PROTONIX) 40 MG tablet Take 1 tablet (40  mg total) by mouth daily. 11/06/14  Yes Kathie Dike, MD  traMADol (ULTRAM) 50 MG tablet Take 50 mg by mouth 2 (two) times daily.  04/26/14  Yes Historical Provider, MD  Vitamin D, Ergocalciferol, (DRISDOL) 50000 UNITS CAPS Take 50,000 Units by mouth 2 (two) times a week. Tuesdays and Thurdays 05/30/12  Yes Historical Provider, MD  nitrofurantoin, macrocrystal-monohydrate, (MACROBID) 100 MG capsule Take 1 capsule (100 mg total) by mouth 2 (two) times daily. 07/17/15   Nat Christen, MD  sulfamethoxazole-trimethoprim (BACTRIM DS,SEPTRA DS) 800-160 MG tablet Take by mouth 2 (two) times daily. Reported on 07/11/2015 06/29/15   Historical Provider, MD   BP 131/63 mmHg  Pulse 58  Temp(Src) 98.6 F (37 C) (Oral)  Resp 20  Ht 5\' 1"  (1.549 m)  Wt 161 lb (73.029 kg)  BMI 30.44 kg/m2  SpO2 98% Physical Exam  Constitutional: She is oriented to person, place, and time. She appears well-developed and well-nourished.  HENT:  Head: Normocephalic and atraumatic.  Eyes: Conjunctivae are normal.  Neck: Neck supple.  Cardiovascular: Normal rate and regular rhythm.   Pulmonary/Chest: Effort normal and breath sounds normal.  Abdominal: Soft. Bowel sounds are normal.  Musculoskeletal: Normal range of motion.  Neurological: She is alert and oriented to person, place, and time.  Skin: Skin is warm and dry.  Psychiatric: She has a normal mood and affect. Her behavior is normal.  Nursing note and vitals reviewed.   ED Course  Procedures (including critical care time) Labs Review Labs Reviewed  URINALYSIS, ROUTINE W REFLEX MICROSCOPIC (NOT AT [facility]) - Abnormal; Notable for the following:    Hgb urine dipstick LARGE (*)    Protein, ur TRACE (*)    Nitrite POSITIVE (*)    Leukocytes, UA SMALL (*)    All other components within normal limits  URINE MICROSCOPIC-ADD ON - Abnormal; Notable for the following:    Squamous Epithelial / LPF 0-5 (*)    Bacteria, UA MANY (*)    All other components within normal limits   URINE CULTURE    Imaging Review No results found. I have personally reviewed and evaluated these images and lab results as part of my medical decision-making.   EKG Interpretation None      MDM   Final diagnoses:  Condition involving Klebsiella pneumoniae    Urine culture reports Klebsiella pneumoniae >100,000 colonies/mL from 07/11/15.  Clinically, the patient is doing very well. She is ambulatory without fever sweats or chills. Her chart was reviewed from 2 previous admissions in July 2016 and September 2016. 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  Since patient is doing so well, I decided not to initiate antibiotic treatment. This was discussed with the patient and her  daughter in great detail. I attempted to answer all their questions. Patient and her daughter understand to return for any further deterioration of her condition    Nat Christen, MD 07/17/15 [phone]

## 2015-07-17 NOTE — ED Notes (Signed)
Patient's family member states patient was here on Saturday and treated for UTI. States hospital called today to told she had a positive culture and wants her admitted for antibiotic therapy.

## 2015-07-17 NOTE — ED Notes (Signed)
Family upset , states they were told to come to the ED for ADMISSION. sTATES THEY WERE TOLD ONLY iv medication would be effective. After review of the chart by Dr Lacinda Axon and Dr Venora Maples it was decided she has a colony and antibiotics would not be effective

## 2015-07-17 NOTE — Discharge Instructions (Signed)
We have reviewed your chart in great detail. Mrs. 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  This recent visit will be the third time.  No antibiotics are indicated at this time. Return for high fever, dehydration, chills, any worsening condition

## 2015-07-17 NOTE — Telephone Encounter (Signed)
microlab calling urine culture results, multi drug resistant speciman.  Will follow up with pharmacy for correct antibiotic treatment.

## 2015-07-18 ENCOUNTER — Telehealth: Payer: Self-pay | Admitting: *Deleted

## 2015-07-18 NOTE — Telephone Encounter (Signed)
Post ED Visit - Positive Culture Follow-up  Culture report reviewed by antimicrobial stewardship pharmacist:  []  Elenor Quinones, Pharm.D. []  Heide Guile, Pharm.D., BCPS []  Parks Neptune, Pharm.D. []  Alycia Rossetti, Pharm.D., BCPS []  Hamilton, Pharm.D., BCPS, AAHIVP []  Legrand Como, Pharm.D., BCPS, AAHIVP []  Milus Glazier, Pharm.D. [x]  Rob Evette Doffing, Pharm.D.  Positive urine culture Returned to ED after being called for IV abx,; pt back to normal so MD thought patient was colonized and no further patient follow-up is required at this time.  Harlon Flor [facility] 07/18/2015, 1:22 PM

## 2015-07-19 ENCOUNTER — Telehealth (HOSPITAL_BASED_OUTPATIENT_CLINIC_OR_DEPARTMENT_OTHER): Payer: Self-pay

## 2015-09-04 ENCOUNTER — Inpatient Hospital Stay (HOSPITAL_COMMUNITY)
Admission: EM | Admit: 2015-09-04 | Discharge: 2015-09-05 | DRG: 690 | Disposition: A | Discharge status: Home / Self Care | Payer: Medicare Other | Attending: Family Medicine | Admitting: Family Medicine

## 2015-09-04 ENCOUNTER — Encounter (HOSPITAL_COMMUNITY): Payer: Self-pay | Admitting: Emergency Medicine

## 2015-09-04 DIAGNOSIS — Z809 Family history of malignant neoplasm, unspecified: Secondary | ICD-10-CM | POA: Diagnosis not present

## 2015-09-04 DIAGNOSIS — E1140 Type 2 diabetes mellitus with diabetic neuropathy, unspecified: Secondary | ICD-10-CM | POA: Diagnosis present

## 2015-09-04 DIAGNOSIS — E785 Hyperlipidemia, unspecified: Secondary | ICD-10-CM | POA: Diagnosis present

## 2015-09-04 DIAGNOSIS — E1121 Type 2 diabetes mellitus with diabetic nephropathy: Secondary | ICD-10-CM | POA: Diagnosis present

## 2015-09-04 DIAGNOSIS — B961 Klebsiella pneumoniae [K. pneumoniae] as the cause of diseases classified elsewhere: Secondary | ICD-10-CM | POA: Diagnosis present

## 2015-09-04 DIAGNOSIS — I2510 Atherosclerotic heart disease of native coronary artery without angina pectoris: Secondary | ICD-10-CM | POA: Diagnosis present

## 2015-09-04 DIAGNOSIS — E039 Hypothyroidism, unspecified: Secondary | ICD-10-CM | POA: Diagnosis present

## 2015-09-04 DIAGNOSIS — E1122 Type 2 diabetes mellitus with diabetic chronic kidney disease: Secondary | ICD-10-CM | POA: Diagnosis present

## 2015-09-04 DIAGNOSIS — Z87440 Personal history of urinary (tract) infections: Secondary | ICD-10-CM

## 2015-09-04 DIAGNOSIS — N184 Chronic kidney disease, stage 4 (severe): Secondary | ICD-10-CM | POA: Diagnosis not present

## 2015-09-04 DIAGNOSIS — I129 Hypertensive chronic kidney disease with stage 1 through stage 4 chronic kidney disease, or unspecified chronic kidney disease: Secondary | ICD-10-CM | POA: Diagnosis present

## 2015-09-04 DIAGNOSIS — Z955 Presence of coronary angioplasty implant and graft: Secondary | ICD-10-CM | POA: Diagnosis not present

## 2015-09-04 DIAGNOSIS — I10 Essential (primary) hypertension: Secondary | ICD-10-CM

## 2015-09-04 DIAGNOSIS — N183 Chronic kidney disease, stage 3 (moderate): Secondary | ICD-10-CM | POA: Diagnosis present

## 2015-09-04 DIAGNOSIS — N179 Acute kidney failure, unspecified: Secondary | ICD-10-CM | POA: Diagnosis present

## 2015-09-04 DIAGNOSIS — I252 Old myocardial infarction: Secondary | ICD-10-CM | POA: Diagnosis not present

## 2015-09-04 DIAGNOSIS — E871 Hypo-osmolality and hyponatremia: Secondary | ICD-10-CM | POA: Diagnosis present

## 2015-09-04 DIAGNOSIS — E860 Dehydration: Secondary | ICD-10-CM | POA: Diagnosis present

## 2015-09-04 DIAGNOSIS — N390 Urinary tract infection, site not specified: Secondary | ICD-10-CM | POA: Diagnosis not present

## 2015-09-04 DIAGNOSIS — I255 Ischemic cardiomyopathy: Secondary | ICD-10-CM | POA: Diagnosis present

## 2015-09-04 HISTORY — DX: Chronic kidney disease, stage 4 (severe): N18.4

## 2015-09-04 LAB — CBC WITH DIFFERENTIAL/PLATELET
BASOS ABS: 0 10*3/uL (ref 0.0–0.1)
Basophils Relative: 1 %
Eosinophils Absolute: 0.3 10*3/uL (ref 0.0–0.7)
Eosinophils Relative: 6 %
HEMATOCRIT: 35.8 % — AB (ref 36.0–46.0)
HEMOGLOBIN: 12.2 g/dL (ref 12.0–15.0)
LYMPHS ABS: 1 10*3/uL (ref 0.7–4.0)
LYMPHS PCT: 20 %
MCH: 31.5 pg (ref 26.0–34.0)
MCHC: 34.1 g/dL (ref 30.0–36.0)
MCV: 92.5 fL (ref 78.0–100.0)
Monocytes Absolute: 0.6 10*3/uL (ref 0.1–1.0)
Monocytes Relative: 12 %
NEUTROS ABS: 3.1 10*3/uL (ref 1.7–7.7)
NEUTROS PCT: 61 %
Platelets: 172 10*3/uL (ref 150–400)
RBC: 3.87 MIL/uL (ref 3.87–5.11)
RDW: 12.8 % (ref 11.5–15.5)
WBC: 5 10*3/uL (ref 4.0–10.5)

## 2015-09-04 LAB — BASIC METABOLIC PANEL
ANION GAP: 5 (ref 5–15)
BUN: 22 mg/dL — AB (ref 6–20)
CHLORIDE: 99 mmol/L — AB (ref 101–111)
CO2: 23 mmol/L (ref 22–32)
Calcium: 8.5 mg/dL — ABNORMAL LOW (ref 8.9–10.3)
Creatinine, Ser: 1.73 mg/dL — ABNORMAL HIGH (ref 0.44–1.00)
GFR calc Af Amer: 31 mL/min — ABNORMAL LOW (ref >60)
GFR, EST NON AFRICAN AMERICAN: 26 mL/min — AB (ref >60)
GLUCOSE: 243 mg/dL — AB (ref 65–99)
POTASSIUM: 3.7 mmol/L (ref 3.5–5.1)
Sodium: 127 mmol/L — ABNORMAL LOW (ref 135–145)

## 2015-09-04 LAB — URINALYSIS, ROUTINE W REFLEX MICROSCOPIC
Bilirubin Urine: NEGATIVE
GLUCOSE, UA: NEGATIVE mg/dL
Nitrite: NEGATIVE
PROTEIN: 100 mg/dL — AB
SPECIFIC GRAVITY, URINE: 1.015 (ref 1.005–1.030)
pH: 5.5 (ref 5.0–8.0)

## 2015-09-04 LAB — GLUCOSE, CAPILLARY: GLUCOSE-CAPILLARY: 125 mg/dL — AB (ref 65–99)

## 2015-09-04 LAB — URINE MICROSCOPIC-ADD ON

## 2015-09-04 MED ORDER — ACETAMINOPHEN 325 MG PO TABS: 650.0000 mg | ORAL_TABLET | Freq: Four times a day (QID) | ORAL | Status: DC | PRN

## 2015-09-04 MED ORDER — INSULIN ASPART 100 UNIT/ML ~~LOC~~ SOLN: 0.0000 [IU] | Freq: Every day | SUBCUTANEOUS | Status: DC

## 2015-09-04 MED ORDER — AMLODIPINE BESYLATE 5 MG PO TABS
5.0000 mg | ORAL_TABLET | Freq: Every day | ORAL | Status: DC
  Administered 2015-09-05: 5 mg via ORAL
  Filled 2015-09-04: qty 1

## 2015-09-04 MED ORDER — ASPIRIN 81 MG PO CHEW
81.0000 mg | CHEWABLE_TABLET | Freq: Every day | ORAL | Status: DC
  Administered 2015-09-05: 81 mg via ORAL
  Filled 2015-09-04: qty 1

## 2015-09-04 MED ORDER — SODIUM CHLORIDE 0.9 % IV SOLN
INTRAVENOUS | Status: DC
  Administered 2015-09-04: 18:00:00 via INTRAVENOUS

## 2015-09-04 MED ORDER — ENOXAPARIN SODIUM 40 MG/0.4ML ~~LOC~~ SOLN
40.0000 mg | SUBCUTANEOUS | Status: DC
  Administered 2015-09-04: 40 mg via SUBCUTANEOUS
  Filled 2015-09-04: qty 0.4

## 2015-09-04 MED ORDER — PANTOPRAZOLE SODIUM 40 MG PO TBEC
40.0000 mg | DELAYED_RELEASE_TABLET | Freq: Every day | ORAL | Status: DC
Start: 2015-09-04 — End: 2015-09-05
  Administered 2015-09-04 – 2015-09-05 (×2): 40 mg via ORAL
  Filled 2015-09-04 (×2): qty 1

## 2015-09-04 MED ORDER — IBUPROFEN 400 MG PO TABS: 200.0000 mg | ORAL_TABLET | Freq: Four times a day (QID) | ORAL | Status: DC | PRN

## 2015-09-04 MED ORDER — TRAMADOL HCL 50 MG PO TABS
50.0000 mg | ORAL_TABLET | Freq: Three times a day (TID) | ORAL | Status: DC
  Administered 2015-09-04 – 2015-09-05 (×2): 50 mg via ORAL
  Filled 2015-09-04 (×2): qty 1

## 2015-09-04 MED ORDER — METOPROLOL TARTRATE 25 MG PO TABS
25.0000 mg | ORAL_TABLET | Freq: Two times a day (BID) | ORAL | Status: DC
  Administered 2015-09-04 – 2015-09-05 (×2): 25 mg via ORAL
  Filled 2015-09-04 (×2): qty 1

## 2015-09-04 MED ORDER — ONDANSETRON 4 MG PO TBDP: 4.0000 mg | ORAL_TABLET | Freq: Three times a day (TID) | ORAL | Status: DC | PRN

## 2015-09-04 MED ORDER — MECLIZINE HCL 12.5 MG PO TABS: 25.0000 mg | ORAL_TABLET | Freq: Four times a day (QID) | ORAL | Status: DC | PRN

## 2015-09-04 MED ORDER — INSULIN ASPART 100 UNIT/ML ~~LOC~~ SOLN
0.0000 [IU] | Freq: Three times a day (TID) | SUBCUTANEOUS | Status: DC
  Administered 2015-09-05: 3 [IU] via SUBCUTANEOUS

## 2015-09-04 MED ORDER — LEVOTHYROXINE SODIUM 112 MCG PO TABS
112.0000 ug | ORAL_TABLET | Freq: Every morning | ORAL | Status: DC
  Administered 2015-09-05: 112 ug via ORAL
  Filled 2015-09-04: qty 1

## 2015-09-04 MED ORDER — SODIUM CHLORIDE 0.9 % IV SOLN
500.0000 mg | Freq: Two times a day (BID) | INTRAVENOUS | Status: DC
  Administered 2015-09-04 – 2015-09-05 (×2): 500 mg via INTRAVENOUS
  Filled 2015-09-04 (×6): qty 500

## 2015-09-04 MED ORDER — ACETAMINOPHEN 650 MG RE SUPP: 650.0000 mg | Freq: Four times a day (QID) | RECTAL | Status: DC | PRN

## 2015-09-04 MED ORDER — DOCUSATE SODIUM 100 MG PO CAPS
100.0000 mg | ORAL_CAPSULE | Freq: Two times a day (BID) | ORAL | Status: DC
  Administered 2015-09-04 – 2015-09-05 (×2): 100 mg via ORAL
  Filled 2015-09-04 (×2): qty 1

## 2015-09-04 NOTE — ED Notes (Signed)
Pt is resting with eyes closed at this time, NAD noted. Family at bedside reminded of urine specimen need.

## 2015-09-04 NOTE — Progress Notes (Signed)
Pharmacy Antibiotic Note  Joann Hunter is a 82 y.o. female admitted on 09/04/2015 with UTI.  Pharmacy has been consulted for Primaxin dosing.  Plan: Primaxin 500mg  IV q12h F/U Cultures Monitor V/S and labs  Height: 5\' 4"  (162.6 cm) Weight: 161 lb (73 kg) IBW/kg (Calculated) : 54.7  Temp (24hrs), Avg:97.9 F (36.6 C), Min:97.9 F (36.6 C), Max:97.9 F (36.6 C)   Recent Labs Lab 09/04/15 1758  WBC 5.0  CREATININE 1.73*    Estimated Creatinine Clearance: 25 mL/min (by C-G formula based on SCr of 1.73 mg/dL).    Allergies  Allergen Reactions  . Ciprofloxacin Other (See Comments)    Hallucination  . Lipitor [Atorvastatin] Other (See Comments)    Muscle Weakness  . Penicillins Rash    Has patient had a PCN reaction causing immediate rash, facial/tongue/throat swelling, SOB or lightheadedness with hypotension: unknown Has patient had a PCN reaction causing severe rash involving mucus membranes or skin necrosis: unknown Has patient had a PCN reaction that required hospitalization: unknown Has patient had a PCN reaction occurring within the last 10 years: unknown If all of the above answers are "NO", then may proceed with Cephalosporin use. 2    Antimicrobials this admission: Primaxin 9/1 >>   Dose adjustments this admission: n/a  Microbiology results: 9/1 UCx: pending. (previous h/o K. Pneumo with MDRO s- imipenem)                          Thank you for allowing pharmacy to be a part of this patient's care. Isac Sarna, BS [place], [place] Clinical Pharmacist Pager [phone]  09/04/2015 9:04 PM

## 2015-09-04 NOTE — ED Triage Notes (Signed)
Has been on antibiotics on and off over a month for recurrent UTI. Pt started having bleeding in urine yesterday. Pt sent by PCP. Pt a/o to most. C/o lower back and headache.

## 2015-09-04 NOTE — ED Provider Notes (Signed)
[facility] DEPT Provider Note   CSN: [id number] Arrival date & time: 09/04/15  1700     History   Chief Complaint Chief Complaint  Patient presents with  . Recurrent UTI    HPI FLORIDALMA LIAO is a 82 y.o. female.  HPI Patient presents to the emergency room to be admitted to the hospital for persistent urinary tract infection. Patient states she's had difficulty over several months because of a persistent urinary tract infection. Her doctor has had her on several rounds of antibiotics. Patient has had increasing generalized weakness. She has had some intermittent episodes of nausea and vomiting although not for the last couple of days. She denies any recent fevers. She has had some headaches and general malaise. Family spoke with her primary doctor today who instructed her to come to the emergency room to be admitted to the hospital for IV antibiotics. She is growing a resistant organism that is not responsive to oral antibiotics. Past Medical History:  Diagnosis Date  . Arthritis   . Back pain, chronic   . CAD (coronary artery disease)    NSTEMI 04/2011 with newly diagnosed 3V CAD s/p PCI/DES mLAD 04/11/11 with residual dz (per Dr. Burt Knack, should have consideration of PCI of the occluded RCA based on symptoms and/or consideration of outpatient nuclear perfusion testing after the patient recovers from her infarct)  . Hyperglycemia   . Hyperlipidemia   . Hypertension   . Hypothyroidism   . Ischemic cardiomyopathy    EF 45% by cath 04/20/11, 50-55% by echo 04/22/11. hypotension limiting medication titration   . Myocardial infarction ([place]) 2013  . Type 2 diabetes with nephropathy ([place]) 07/26/2014  . UTI (urinary tract infection) 12/13/2013    Patient Active Problem List   Diagnosis Date Noted  . Rectal bleeding 11/05/2014  . Absolute anemia 11/05/2014  . Hypothyroidism 11/05/2014  . Metabolic encephalopathy [id number]  . Type 2 diabetes with nephropathy ([place]) 07/26/2014  .  Essential hypertension 07/26/2014  . Hyponatremia 07/25/2014  . Vertigo 07/25/2014  . AKI (acute kidney injury) ([place]) 12/13/2013  . Fall 12/13/2013  . UTI (lower urinary tract infection) 12/13/2013  . CKD (chronic kidney disease), stage III 12/13/2013  . Chest pain at rest 04/26/2011  . Syncope, near 04/26/2011  . CAD (coronary artery disease) 04/23/2011  . Cardiomyopathy, ischemic 04/23/2011  . Dyslipidemia 04/23/2011  . Myocardial infarction, anterior wall, initial care ([place]) 04/20/2011    Past Surgical History:  Procedure Laterality Date  . BACK SURGERY    . BREAST LUMPECTOMY     right  . CORONARY STENT PLACEMENT    . LEFT HEART CATHETERIZATION WITH CORONARY ANGIOGRAM N/A 04/20/2011   Procedure: LEFT HEART CATHETERIZATION WITH CORONARY ANGIOGRAM;  Surgeon: Burnell Blanks, MD;  Location: [facility] CATH LAB;  Service: Cardiovascular;  Laterality: N/A;  . PERCUTANEOUS CORONARY STENT INTERVENTION (PCI-S) N/A 04/21/2011   Procedure: PERCUTANEOUS CORONARY STENT INTERVENTION (PCI-S);  Surgeon: Sherren Mocha, MD;  Location: [facility] C[facility] LAB;  Service: Cardiovascular;  Laterality: N/A;    OB History    Gravida Para Term Preterm AB Living   2 2 2     2    SAB TAB Ectopic Multiple Live Births                   Home Medications    Prior to Admission medications   Medication Sig Start Date End Date Taking? Authorizing Provider  amLODipine (NORVASC) 10 MG tablet Take 0.5 tablets (5 mg total) by mouth daily. 09/18/14  Yes Samuella Cota, MD  aspirin 81 MG chewable tablet Chew 81 mg by mouth daily.    Yes Historical Provider, MD  docusate sodium (COLACE) 100 MG capsule Take 1 capsule (100 mg total) by mouth 2 (two) times daily. 11/06/14  Yes Kathie Dike, MD  fish oil-omega-3 fatty acids 1000 MG capsule Take 1 g by mouth daily.   Yes Historical Provider, MD  ibuprofen (ADVIL,MOTRIN) 200 MG tablet Take 200 mg by mouth every 6 (six) hours as needed for moderate pain.   Yes Historical  Provider, MD  levothyroxine (SYNTHROID, LEVOTHROID) 112 MCG tablet Take 1 tablet (112 mcg total) by mouth every morning. 12/16/13  Yes Verlee Monte, MD  meclizine (ANTIVERT) 25 MG tablet Take 25 mg by mouth 4 (four) times daily as needed. Dizziness   Yes Historical Provider, MD  metoprolol tartrate (LOPRESSOR) 25 MG tablet Take 25 mg by mouth 2 (two) times daily.  04/23/11  Yes Dayna N Dunn, PA-C  nitrofurantoin, macrocrystal-monohydrate, (MACROBID) 100 MG capsule Take 1 capsule (100 mg total) by mouth 2 (two) times daily. 07/17/15  Yes Nat Christen, MD  nitroGLYCERIN (NITROSTAT) 0.4 MG SL tablet Place 0.4 mg under the tongue every 5 (five) minutes x 3 doses as needed. For chest pain. 04/23/11  Yes Dayna N Dunn, PA-C  ondansetron (ZOFRAN-ODT) 4 MG disintegrating tablet Take 4 mg by mouth every 8 (eight) hours as needed for nausea or vomiting.   Yes Historical Provider, MD  pantoprazole (PROTONIX) 40 MG tablet Take 1 tablet (40 mg total) by mouth daily. 11/06/14  Yes Kathie Dike, MD  traMADol (ULTRAM) 50 MG tablet Take 50 mg by mouth 3 (three) times daily.  04/26/14  Yes Historical Provider, MD  Vitamin D, Ergocalciferol, (DRISDOL) 50000 UNITS CAPS Take 50,000 Units by mouth 2 (two) times a week. Tuesdays and Thurdays 05/30/12  Yes Historical Provider, MD  cephALEXin (KEFLEX) 500 MG capsule 2 caps po bid x 7 days Patient not taking: Reported on 09/04/2015 07/11/15   Merrily Pew, MD    Family History Family History  Problem Relation Age of Onset  . Other      no known family cardiac disease    Social History Social History  Substance Use Topics  . Smoking status: Never Smoker  . Smokeless tobacco: Never Used  . Alcohol use No     Allergies   Ciprofloxacin; Lipitor [atorvastatin]; and Penicillins   Review of Systems Review of Systems  All other systems reviewed and are negative.    Physical Exam Updated Vital Signs BP 143/58   Pulse 83   Temp 97.9 F (36.6 C) (Oral)   Resp 16   Ht  5\' 4"  (1.626 m)   Wt 73 kg   SpO2 97%   BMI 27.64 kg/m   Physical Exam  Constitutional: No distress.  Elderly, frail  HENT:  Head: Normocephalic and atraumatic.  Right Ear: External ear normal.  Left Ear: External ear normal.  Eyes: Conjunctivae are normal. Right eye exhibits no discharge. Left eye exhibits no discharge. No scleral icterus.  Neck: Neck supple. No tracheal deviation present.  Cardiovascular: Normal rate, regular rhythm and intact distal pulses.   Pulmonary/Chest: Effort normal and breath sounds normal. No stridor. No respiratory distress. She has no wheezes. She has no rales.  Abdominal: Soft. Bowel sounds are normal. She exhibits no distension. There is no tenderness. There is no rebound and no guarding.  Musculoskeletal: She exhibits no edema or tenderness.  Neurological: She is alert.  No cranial nerve deficit (no facial droop, extraocular movements intact, no slurred speech) or sensory deficit. She exhibits normal muscle tone. She displays no seizure activity. Coordination normal.  Generalized weakness  Skin: Skin is warm and dry. No rash noted.  Psychiatric: She has a normal mood and affect.  Nursing note and vitals reviewed.    ED Treatments / Results  Labs (all labs ordered are listed, but only abnormal results are displayed) Labs Reviewed  CBC WITH DIFFERENTIAL/PLATELET - Abnormal; Notable for the following:       Result Value   HCT 35.8 (*)    All other components within normal limits  BASIC METABOLIC PANEL - Abnormal; Notable for the following:    Sodium 127 (*)    Chloride 99 (*)    Glucose, Bld 243 (*)    BUN 22 (*)    Creatinine, Ser 1.73 (*)    Calcium 8.5 (*)    GFR calc non Af Amer 26 (*)    GFR calc Af Amer 31 (*)    All other components within normal limits  URINE CULTURE  URINALYSIS, ROUTINE W REFLEX MICROSCOPIC (NOT AT [facility])    Procedures Procedures (including critical care time)  Medications Ordered in ED Medications  0.9 %   sodium chloride infusion ( Intravenous New Bag/Given 09/04/15 1759)     Initial Impression / Assessment and Plan / ED Course  I have reviewed the triage vital signs and the nursing notes.  Pertinent labs & imaging results that were available during my care of the patient were reviewed by me and considered in my medical decision making (see chart for details).  Clinical Course  Comment By Time  Urine culture performed this past July was reviewed. Klebsiella pneumoniae that was resistant to all antibiotics except for gentamicin and imipenem Dorie Rank, MD 09/01 1840    Pt has been on several antibiotics for a resistant UTI.  Her sx persist.  She came to the ED with an outpatient culture report that I attached to this note.  Pt was told to come to the hospital to be admitted. Will start her on Imipenem.  I will consult with hospitalist.  Final Clinical Impressions(s) / ED Diagnoses   Final diagnoses:  UTI (lower urinary tract infection)    New Prescriptions New Prescriptions   No medications on file     Dorie Rank, MD 09/04/15 1943

## 2015-09-04 NOTE — ED Notes (Signed)
Pt is oriented to self, place, and event. States year in 1985. Per family, pt has increased confusion.

## 2015-09-04 NOTE — H&P (Addendum)
History and Physical  [name] [id number] DOB: [birth year] DOA: 09/04/2015  Referring physician: Dr Tomi Bamberger, ED physician PCP: Monico Blitz, MD  Outpatient Specialists: none  Chief Complaint: Weakness, PCP told her to come due to UTI  HPI: Joann Hunter is a 82 y.o. female with a history of coronary artery disease status post an STEMI on 4/13, diabetes, hyperlipidemia, hypertension, hypothyroidism and UTI with multidrug resistant organism. For the past couple of months the patient has been off and on antibiotics for UTI that has knots resolved. She was seen in the ER here on 7/8 and was diagnosed with a UTI. That urine culture resulted as Klebsiella pneumoniae that was resistant to most antibiotics except for imipenem and gentamicin. Prior to leaving, she was given a prescription for Keflex, which she took without improvement. She was also seen on 7/14, although it was determined that she had colonization was not symptomatic from her bladder infection. Over the past couple of weeks, the patient has become increasingly weak with intermittent nausea. She denies fevers or chills. She has had a couple of episodes of emesis. She denies abdominal pain, frequency, hesitancy, dysuria. She did have a urine culture performed by her primary doctor, which shows essentially the same resistance and sensitivities. She does have hematuria over the past day or so. No palliating or provoking factors.  Review of Systems:   Pt denies any fevers, chills, nausea, vomiting, diarrhea, constipation, abdominal pain, shortness of breath, dyspnea on exertion, orthopnea, cough, wheezing, palpitations, headache, vision changes, lightheadedness, dizziness, melena, rectal bleeding.  Review of systems are otherwise negative  Past Medical History:  Diagnosis Date  . Arthritis   . Back pain, chronic   . CAD (coronary artery disease)    NSTEMI 04/2011 with newly diagnosed 3V CAD s/p PCI/DES mLAD 04/11/11 with residual dz  (per Dr. Burt Knack, should have consideration of PCI of the occluded RCA based on symptoms and/or consideration of outpatient nuclear perfusion testing after the patient recovers from her infarct)  . Hyperglycemia   . Hyperlipidemia   . Hypertension   . Hypothyroidism   . Ischemic cardiomyopathy    EF 45% by cath 04/20/11, 50-55% by echo 04/22/11. hypotension limiting medication titration   . Myocardial infarction ([place]) 2013  . Type 2 diabetes with nephropathy ([place]) 07/26/2014  . UTI (urinary tract infection) 12/13/2013   Past Surgical History:  Procedure Laterality Date  . BACK SURGERY    . BREAST LUMPECTOMY     right  . CORONARY STENT PLACEMENT    . LEFT HEART CATHETERIZATION WITH CORONARY ANGIOGRAM N/A 04/20/2011   Procedure: LEFT HEART CATHETERIZATION WITH CORONARY ANGIOGRAM;  Surgeon: Burnell Blanks, MD;  Location: [facility] CATH LAB;  Service: Cardiovascular;  Laterality: N/A;  . PERCUTANEOUS CORONARY STENT INTERVENTION (PCI-S) N/A 04/21/2011   Procedure: PERCUTANEOUS CORONARY STENT INTERVENTION (PCI-S);  Surgeon: Sherren Mocha, MD;  Location: [facility] CATH LAB;  Service: Cardiovascular;  Laterality: N/A;   Social History:  reports that she has never smoked. She has never used smokeless tobacco. She reports that she does not drink alcohol or use drugs. Patient lives at home  Allergies  Allergen Reactions  . Ciprofloxacin Other (See Comments)    Hallucination  . Lipitor [Atorvastatin] Other (See Comments)    Muscle Weakness  . Penicillins Rash    Has patient had a PCN reaction causing immediate rash, facial/tongue/throat swelling, SOB or lightheadedness with hypotension: unknown Has patient had a PCN reaction causing severe rash involving mucus membranes or skin necrosis:  unknown Has patient had a PCN reaction that required hospitalization: unknown Has patient had a PCN reaction occurring within the last 10 years: unknown If all of the above answers are "NO", then may proceed with  Cephalosporin use. 2    Family History  Problem Relation Age of Onset  . Cancer Father   . Other      no known family cardiac disease      Prior to Admission medications   Medication Sig Start Date End Date Taking? Authorizing Provider  amLODipine (NORVASC) 10 MG tablet Take 0.5 tablets (5 mg total) by mouth daily. 09/18/14  Yes Samuella Cota, MD  aspirin 81 MG chewable tablet Chew 81 mg by mouth daily.    Yes Historical Provider, MD  docusate sodium (COLACE) 100 MG capsule Take 1 capsule (100 mg total) by mouth 2 (two) times daily. 11/06/14  Yes Kathie Dike, MD  fish oil-omega-3 fatty acids 1000 MG capsule Take 1 g by mouth daily.   Yes Historical Provider, MD  ibuprofen (ADVIL,MOTRIN) 200 MG tablet Take 200 mg by mouth every 6 (six) hours as needed for moderate pain.   Yes Historical Provider, MD  levothyroxine (SYNTHROID, LEVOTHROID) 112 MCG tablet Take 1 tablet (112 mcg total) by mouth every morning. 12/16/13  Yes Verlee Monte, MD  meclizine (ANTIVERT) 25 MG tablet Take 25 mg by mouth 4 (four) times daily as needed. Dizziness   Yes Historical Provider, MD  metoprolol tartrate (LOPRESSOR) 25 MG tablet Take 25 mg by mouth 2 (two) times daily.  04/23/11  Yes Dayna N Dunn, PA-C  nitrofurantoin, macrocrystal-monohydrate, (MACROBID) 100 MG capsule Take 1 capsule (100 mg total) by mouth 2 (two) times daily. 07/17/15  Yes Nat Christen, MD  nitroGLYCERIN (NITROSTAT) 0.4 MG SL tablet Place 0.4 mg under the tongue every 5 (five) minutes x 3 doses as needed. For chest pain. 04/23/11  Yes Dayna N Dunn, PA-C  ondansetron (ZOFRAN-ODT) 4 MG disintegrating tablet Take 4 mg by mouth every 8 (eight) hours as needed for nausea or vomiting.   Yes Historical Provider, MD  pantoprazole (PROTONIX) 40 MG tablet Take 1 tablet (40 mg total) by mouth daily. 11/06/14  Yes Kathie Dike, MD  traMADol (ULTRAM) 50 MG tablet Take 50 mg by mouth 3 (three) times daily.  04/26/14  Yes Historical Provider, MD  Vitamin D,  Ergocalciferol, (DRISDOL) 50000 UNITS CAPS Take 50,000 Units by mouth 2 (two) times a week. Tuesdays and Thurdays 05/30/12  Yes Historical Provider, MD    Physical Exam: BP 160/79   Pulse 86   Temp 97.9 F (36.6 C) (Oral)   Resp 18   Ht 5\' 4"  (1.626 m)   Wt 73 kg (161 lb)   SpO2 98%   BMI 27.64 kg/m   General: Elderly Caucasian female. Awake and alert and oriented x3. No acute cardiopulmonary distress.  HEENT: Normocephalic atraumatic.  Right and left ears normal in appearance.  Pupils equal, round, reactive to light. Extraocular muscles are intact. Sclerae anicteric and noninjected.  Moist mucosal membranes. No mucosal lesions.  Neck: Neck supple without lymphadenopathy. No carotid bruits. No masses palpated.  Cardiovascular: Regular rate with normal S1-S2 sounds. No murmurs, rubs, gallops auscultated. No JVD.  Respiratory: Good respiratory effort with no wheezes, rales, rhonchi. Lungs clear to auscultation bilaterally.  No accessory muscle use. Abdomen: Soft, nontender, nondistended. Active bowel sounds. No masses or hepatosplenomegaly  Skin: No rashes, lesions, or ulcerations.  Dry, warm to touch. 2+ dorsalis pedis and radial pulses.  Musculoskeletal: No calf or leg pain. All major joints not erythematous nontender.  No upper or lower joint deformation.  Good ROM.  No contractures  Psychiatric: Intact judgment and insight. Pleasant and cooperative. Neurologic: No focal neurological deficits. Strength is 5/5 and symmetric in upper and lower extremities.  Cranial nerves II through XII are grossly intact.           Labs on Admission: I have personally reviewed following labs and imaging studies  CBC:  Recent Labs Lab 09/04/15 1758  WBC 5.0  NEUTROABS 3.1  HGB 12.2  HCT 35.8*  MCV 92.5  PLT [id number]   Basic Metabolic Panel:  Recent Labs Lab 09/04/15 1758  NA 127*  K 3.7  CL 99*  CO2 23  GLUCOSE 243*  BUN 22*  CREATININE 1.73*  CALCIUM 8.5*   GFR: Estimated Creatinine  Clearance: 25 mL/min (by C-G formula based on SCr of 1.73 mg/dL). Liver Function Tests: No results for input(s): AST, ALT, ALKPHOS, BILITOT, PROT, ALBUMIN in the last 168 hours. No results for input(s): LIPASE, AMYLASE in the last 168 hours. No results for input(s): AMMONIA in the last 168 hours. Coagulation Profile: No results for input(s): INR, PROTIME in the last 168 hours. Cardiac Enzymes: No results for input(s): CKTOTAL, CKMB, CKMBINDEX, TROPONINI in the last 168 hours. BNP (last 3 results) No results for input(s): PROBNP in the last 8760 hours. HbA1C: No results for input(s): HGBA1C in the last 72 hours. CBG: No results for input(s): GLUCAP in the last 168 hours. Lipid Profile: No results for input(s): CHOL, HDL, LDLCALC, TRIG, CHOLHDL, LDLDIRECT in the last 72 hours. Thyroid Function Tests: No results for input(s): TSH, T4TOTAL, FREET4, T3FREE, THYROIDAB in the last 72 hours. Anemia Panel: No results for input(s): VITAMINB12, FOLATE, FERRITIN, TIBC, IRON, RETICCTPCT in the last 72 hours. Urine analysis:    Component Value Date/Time   COLORURINE YELLOW 09/04/2015 1853   APPEARANCEUR HAZY (A) 09/04/2015 1853   LABSPEC 1.015 09/04/2015 1853   PHURINE 5.5 09/04/2015 1853   GLUCOSEU NEGATIVE 09/04/2015 1853   HGBUR LARGE (A) 09/04/2015 1853   BILIRUBINUR NEGATIVE 09/04/2015 1853   KETONESUR TRACE (A) 09/04/2015 1853   PROTEINUR 100 (A) 09/04/2015 1853   UROBILINOGEN 0.2 11/05/2014 2000   NITRITE NEGATIVE 09/04/2015 1853   LEUKOCYTESUR MODERATE (A) 09/04/2015 1853   Sepsis Labs: @LABRCNTIP (procalcitonin:4,lacticidven:4) )No results found for this or any previous visit (from the past 240 hour(s)).   Radiological Exams on Admission: No results found.   Assessment/Plan: Principal Problem:   UTI (lower urinary tract infection) Active Problems:   CKD (chronic kidney disease), stage III   Hyponatremia   Type 2 diabetes with nephropathy ([place])   Essential hypertension      This patient was discussed with the ED physician, including pertinent vitals, physical exam findings, labs, and imaging.  We also discussed care given by the ED provider.  #1 UTI  Admit to med surg  Patient's symptomatic with mild weakness  Imipenem started in the emergency department- will consult pharmacy for dosing  Consult case management for Home health for IV antibiotics  Urine culture pending  #2 hyponatremia  Creatinine slightly increased from her baseline a year ago of 1.25  We'll give IV fluids at 125 miles per hour overnight and recheck sodium in the morning  #3 type 2 diabetes  Sliding scale insulin   Diet controlled at home  #4 stage III chronic kidney disease  Will renally dose medications  #5 hypertension  Continue antihypertensives  DVT prophylaxis: Lovenox  Consultants: Case management  Code Status: Full code  Family Communication: Daughter in the room  Disposition Plan: Return home with home health  Truett Mainland, DO [facility] Pager [phone]  If 7PM-7AM, please contact night-coverage [email or web address] Password [email or web address]

## 2015-09-05 ENCOUNTER — Encounter (HOSPITAL_COMMUNITY): Payer: Self-pay | Admitting: Family Medicine

## 2015-09-05 DIAGNOSIS — N390 Urinary tract infection, site not specified: Principal | ICD-10-CM

## 2015-09-05 DIAGNOSIS — E871 Hypo-osmolality and hyponatremia: Secondary | ICD-10-CM

## 2015-09-05 DIAGNOSIS — N179 Acute kidney failure, unspecified: Secondary | ICD-10-CM

## 2015-09-05 DIAGNOSIS — N184 Chronic kidney disease, stage 4 (severe): Secondary | ICD-10-CM

## 2015-09-05 DIAGNOSIS — E1121 Type 2 diabetes mellitus with diabetic nephropathy: Secondary | ICD-10-CM

## 2015-09-05 LAB — BASIC METABOLIC PANEL
ANION GAP: 7 (ref 5–15)
BUN: 18 mg/dL (ref 6–20)
CALCIUM: 8.7 mg/dL — AB (ref 8.9–10.3)
CO2: 22 mmol/L (ref 22–32)
CREATININE: 1.44 mg/dL — AB (ref 0.44–1.00)
Chloride: 104 mmol/L (ref 101–111)
GFR, EST AFRICAN AMERICAN: 38 mL/min — AB (ref >60)
GFR, EST NON AFRICAN AMERICAN: 33 mL/min — AB (ref >60)
Glucose, Bld: 167 mg/dL — ABNORMAL HIGH (ref 65–99)
Potassium: 3.8 mmol/L (ref 3.5–5.1)
SODIUM: 133 mmol/L — AB (ref 135–145)

## 2015-09-05 LAB — GLUCOSE, CAPILLARY
Glucose-Capillary: 162 mg/dL — ABNORMAL HIGH (ref 65–99)
Glucose-Capillary: 128 mg/dL — ABNORMAL HIGH (ref 65–99)

## 2015-09-05 MED ORDER — FOSFOMYCIN TROMETHAMINE 3 G PO PACK
3.0000 g | PACK | Freq: Once | ORAL | Status: AC
Start: 2015-09-05 — End: 2015-09-05
  Administered 2015-09-05: 3 g via ORAL
  Filled 2015-09-05: qty 3

## 2015-09-05 MED ORDER — FOSFOMYCIN TROMETHAMINE 3 G PO PACK: 3.0000 g | PACK | Freq: Once | ORAL | 0 refills | Status: AC

## 2015-09-05 NOTE — Progress Notes (Signed)
PROGRESS NOTE  Joann Hunter [id number] DOB: 11-Oct-1932 DOA: 09/04/2015 PCP: Monico Blitz, MD  Brief Narrative: 82 year old woman with a history of CAD s/p NSTEMI 04/2011, HLD, HTN, hypothyroidism, and type two diabetes with neuropathy presented with weakness. She has been admitted for a recurrent UTI.   Assessment/Plan: 1. UTI MDR with hematuria, generalized weakness. She has been on several antibiotics as an outpatient for UTI. Discussed with Dr. Linus Salmons infectious disease, given clinical stability, recommend fosfomycin now and repeat once in 3 days. 2. Acute encephalopathy? No clinical evidence of this point.  3. Hypovolemic hyponatremia. Improving with IVF, secondary to dehydration 4. Type 2 diabetes with neuropathy. She is diet controlled at home. Continue sliding scale insulin. 5. AKI superimposed on CKD stage III. AKI resolved. Her creatinine is currently at baseline.  6. Hypothyroidism 7. Ischemic cardiomyopathy. Past echo showed an EF of 45%. Stable. 8. PMH CAD. She had NSTEMI 04/2011 with 3 vessel CAD s/p PCI.    Doing well. Single dose of fosfomycin now, repeat in 3 days.  Microbiology from previous admissions reviewed and discussed with infectious disease. Patient has a history of colonization with this organism, however her recent symptomatology suggests that she is currently asymptomatic and therefore treatment is indicated.   DVT prophylaxis: Lovenox  Code Status: Full  Family Communication: No family bedside Disposition Plan: Discharge home once improved   Murray Hodgkins, MD  [facility] Direct contact: [phone] --Via amion app OR  --[email or web address]; password [email or web address]  7PM-7AM contact night coverage as above 09/05/2015, 5:45 AM  LOS: 1 day   Consultants:  None   Procedures:  None   Antimicrobials:  Primaxin 9/1 >>  HPI/Subjective: Feels well. She states she feels "dizzy" but this is normal.  Eating well. Denies any pain, nausea, and vomiting. No  dysuria.  Objective: Vitals:   09/04/15 2100 09/04/15 2130 09/04/15 2223 09/05/15 0500  BP: (!) 124/20  (!) 151/79 138/73  Pulse: 86   81  Resp: 20   18  Temp: 98.4 F (36.9 C)   99.3 F (37.4 C)  TempSrc: Oral   Oral  SpO2: 100%   92%  Weight: 73 kg (160 lb 15 oz) 70.9 kg (156 lb 6.4 oz)    Height: 5\' 4"  (1.626 m)       Intake/Output Summary (Last 24 hours) at 09/05/15 0545 Last data filed at 09/04/15 2130  Gross per 24 hour  Intake                0 ml  Output              400 ml  Net             -400 ml     Filed Weights   09/04/15 1707 09/04/15 2100 09/04/15 2130  Weight: 73 kg (161 lb) 73 kg (160 lb 15 oz) 70.9 kg (156 lb 6.4 oz)    Exam:    Constitutional:  . Appears calm and comfortable Respiratory:  . CTA bilaterally, no w/r/r.  . Respiratory effort normal. No retractions or accessory muscle use Cardiovascular:  . RRR, no m/r/g . No LE extremity edema    I have personally reviewed following labs and imaging studies:  Sodium 133, stable.   BUN 18, Cr 1.44, Improved   Glucose 167. Stable.   Scheduled Meds: . amLODipine  5 mg Oral Daily  . aspirin  81 mg Oral Daily  . docusate sodium  100 mg Oral BID  . enoxaparin (  LOVENOX) injection  40 mg Subcutaneous Q24H  . imipenem-cilastatin  500 mg Intravenous Q12H  . insulin aspart  0-15 Units Subcutaneous TID WC  . insulin aspart  0-5 Units Subcutaneous QHS  . levothyroxine  112 mcg Oral q morning - 10a  . metoprolol tartrate  25 mg Oral BID  . pantoprazole  40 mg Oral Daily  . traMADol  50 mg Oral TID   Continuous Infusions: . sodium chloride 125 mL/hr at 09/04/15 1759    Principal Problem:   UTI (lower urinary tract infection) Active Problems:   CKD (chronic kidney disease), stage III   Hyponatremia   Type 2 diabetes with nephropathy ([place])   Essential hypertension   LOS: 1 day       By signing my name below, I, Collene Leyden, attest that this documentation has been prepared under the  direction and in the presence of Murray Hodgkins, MD. Electronically signed: Collene Leyden, Scribe. 09/05/15 9:10 AM   I personally performed the services described in this documentation. All medical record entries made by the scribe were at my direction. I have reviewed the chart and agree that the record reflects my personal performance and is accurate and complete. Murray Hodgkins, MD

## 2015-09-05 NOTE — Discharge Summary (Addendum)
Physician Discharge Summary  Joann Hunter [id number] DOB: 10-18-1932 DOA: 09/04/2015  PCP: Monico Blitz, MD  Admit date: 09/04/2015 Discharge date: 09/05/2015  Recommendations for Outpatient Follow-up:  1. Follow up resolution of UTI. 2. History of colonization of the urine. Would avoid treatment in the future unless clearly symptomatic.   Follow-up Information    SHAH,ASHISH, MD. Schedule an appointment as soon as possible for a visit in 1 week(s).   Specialty:  Internal Medicine Contact information: [address] [place] [address] [phone]          Discharge Diagnoses:  1. Klebsiella pneumoniae UTI MDR  2. Type 2 diabetes with neuropathy  3. AKI superimposed on CKD stage III  Discharge Condition: Improved  Disposition: Home   Diet recommendation: Carb modified   Filed Weights   09/04/15 1707 09/04/15 2100 09/04/15 2130  Weight: 73 kg (161 lb) 73 kg (160 lb 15 oz) 70.9 kg (156 lb 6.4 oz)    History of present illness:  82 year old woman with a history of CAD s/p NSTEMI 04/2011, HLD, HTN, hypothyroidism, and type two diabetes with neuropathy presented with weakness. She has been admitted for a recurrent UTI.   Hospital Course:  Patient rapidly improved with IV fluids and IV antibiotics. Hemodynamics are stable, afebrile and no leukocytosis. Urinary symptoms improved. Microbiology data from previous hospitalizations was reviewed as well as that which was available in the emergency department physicians note. I have been unable to locate the physical copy of this culture report. Case was discussed with infectious disease as below with recommendation for oral fosfomycin. Hospitalization was uncomplicated.  1. UTI MDR with hematuria, generalized weakness. She has been on several antibiotics as an outpatient for UTI. Discussed with Dr. Linus Salmons infectious disease, given clinical stability, recommend fosfomycin now and repeat once in 3 days. 2. Acute encephalopathy? No  clinical evidence of this point.  3. Hypovolemic hyponatremia. Improving with IVF, secondary to dehydration 4. Type 2 diabetes with neuropathy. She is diet controlled at home. Continue sliding scale insulin. 5. AKI superimposed on CKD stage III. AKI resolved. Her creatinine is currently at baseline.  6. Hypothyroidism 7. Ischemic cardiomyopathy. Past echo showed an EF of 45%. Stable. 8. PMH CAD. She had NSTEMI 04/2011 with 3 vessel CAD s/p PCI.    Microbiology from previous admissions reviewed and discussed with infectious disease. Patient has a history of colonization with this organism, however her recent symptomatology suggests that she is currently asymptomatic and therefore treatment is indicated.  Consultants:  None   Procedures:  None   Antimicrobials:  Primaxin 9/1 >> 9/2  Fosfomycin 9/2 & 9/5  Discharge Instructions  Discharge Instructions    Activity as tolerated - No restrictions    Complete by:  As directed   Diet Carb Modified    Complete by:  As directed   Discharge instructions    Complete by:  As directed   Call your physician or seek immediate medical attention for fever, difficulty urinating, burning or pain with urination, blood in urine, vomiting or worsening of condition.       Medication List    TAKE these medications   amLODipine 10 MG tablet Commonly known as:  NORVASC Take 0.5 tablets (5 mg total) by mouth daily.   aspirin 81 MG chewable tablet Chew 81 mg by mouth daily.   docusate sodium 100 MG capsule Commonly known as:  COLACE Take 1 capsule (100 mg total) by mouth 2 (two) times daily.   fish oil-omega-3  fatty acids 1000 MG capsule Take 1 g by mouth daily.   fosfomycin 3 g Pack Commonly known as:  MONUROL Take 3 g by mouth once. Take 9/5 in AM. No further doses thereafter.   ibuprofen 200 MG tablet Commonly known as:  ADVIL,MOTRIN Take 200 mg by mouth every 6 (six) hours as needed for moderate pain.   levothyroxine 112 MCG  tablet Commonly known as:  SYNTHROID, LEVOTHROID Take 1 tablet (112 mcg total) by mouth every morning.   meclizine 25 MG tablet Commonly known as:  ANTIVERT Take 25 mg by mouth 4 (four) times daily as needed. Dizziness   metoprolol tartrate 25 MG tablet Commonly known as:  LOPRESSOR Take 25 mg by mouth 2 (two) times daily.   nitrofurantoin (macrocrystal-monohydrate) 100 MG capsule Commonly known as:  MACROBID Take 1 capsule (100 mg total) by mouth 2 (two) times daily.   nitroGLYCERIN 0.4 MG SL tablet Commonly known as:  NITROSTAT Place 0.4 mg under the tongue every 5 (five) minutes x 3 doses as needed. For chest pain.   ondansetron 4 MG disintegrating tablet Commonly known as:  ZOFRAN-ODT Take 4 mg by mouth every 8 (eight) hours as needed for nausea or vomiting.   pantoprazole 40 MG tablet Commonly known as:  PROTONIX Take 1 tablet (40 mg total) by mouth daily.   traMADol 50 MG tablet Commonly known as:  ULTRAM Take 50 mg by mouth 3 (three) times daily.   Vitamin D (Ergocalciferol) 50000 units Caps capsule Commonly known as:  DRISDOL Take 50,000 Units by mouth 2 (two) times a week. Tuesdays and Thurdays      Allergies  Allergen Reactions  . Ciprofloxacin Other (See Comments)    Hallucination  . Lipitor [Atorvastatin] Other (See Comments)    Muscle Weakness  . Penicillins Rash    Has patient had a PCN reaction causing immediate rash, facial/tongue/throat swelling, SOB or lightheadedness with hypotension: unknown Has patient had a PCN reaction causing severe rash involving mucus membranes or skin necrosis: unknown Has patient had a PCN reaction that required hospitalization: unknown Has patient had a PCN reaction occurring within the last 10 years: unknown If all of the above answers are "NO", then may proceed with Cephalosporin use. 2    The results of significant diagnostics from this hospitalization (including imaging, microbiology, ancillary and laboratory) are  listed below for reference.      Labs: Basic Metabolic Panel:  Recent Labs Lab 09/04/15 1758 09/05/15 0554  NA 127* 133*  K 3.7 3.8  CL 99* 104  CO2 23 22  GLUCOSE 243* 167*  BUN 22* 18  CREATININE 1.73* 1.44*  CALCIUM 8.5* 8.7*   CBC:  Recent Labs Lab 09/04/15 1758  WBC 5.0  NEUTROABS 3.1  HGB 12.2  HCT 35.8*  MCV 92.5  PLT 172    CBG:  Recent Labs Lab 09/04/15 2146 09/05/15 0745 09/05/15 1131  GLUCAP 125* 162* 128*    Principal Problem:   UTI (lower urinary tract infection) Active Problems:   AKI (acute kidney injury) ([place])   Chronic kidney disease (CKD), stage IV (severe) ([facility])   Hyponatremia   Type 2 diabetes with nephropathy ([place])   Time coordinating discharge: 35 minutes   Signed:  Murray Hodgkins, MD [facility] 09/05/2015, 11:45 AM  By signing my name below, I, Collene Leyden, attest that this documentation has been prepared under the direction and in the presence of Murray Hodgkins, MD. Electronically signed: Collene Leyden, Scribe. 09/05/15 9:10 AM  I personally performed the services described in this documentation. All medical record entries made by the scribe were at my direction. I have reviewed the chart and agree that the record reflects my personal performance and is accurate and complete. Murray Hodgkins, MD

## 2015-09-05 NOTE — Progress Notes (Signed)
Pt IV removed per NT, tolerated well.  Reviewed discharge instructions with pt and daughter.  Answered questions at this time.

## 2015-09-09 LAB — URINE CULTURE

## 2015-10-30 ENCOUNTER — Ambulatory Visit (INDEPENDENT_AMBULATORY_CARE_PROVIDER_SITE_OTHER): Payer: Medicare Other | Admitting: Urology

## 2015-10-30 ENCOUNTER — Other Ambulatory Visit (HOSPITAL_COMMUNITY)
Admission: RE | Admit: 2015-10-30 | Discharge: 2015-10-30 | Disposition: A | Discharge status: Home / Self Care | Payer: Medicare Other | Source: Other Acute Inpatient Hospital | Attending: Urology | Admitting: Urology

## 2015-10-30 DIAGNOSIS — C669 Malignant neoplasm of unspecified ureter: Secondary | ICD-10-CM

## 2015-10-31 LAB — URINE CULTURE

## 2015-11-11 ENCOUNTER — Other Ambulatory Visit: Payer: Self-pay | Admitting: Urology

## 2015-11-11 ENCOUNTER — Ambulatory Visit (INDEPENDENT_AMBULATORY_CARE_PROVIDER_SITE_OTHER): Payer: Medicare Other | Admitting: Urology

## 2015-11-11 DIAGNOSIS — C669 Malignant neoplasm of unspecified ureter: Secondary | ICD-10-CM

## 2015-11-19 ENCOUNTER — Ambulatory Visit (HOSPITAL_COMMUNITY)
Admission: RE | Admit: 2015-11-19 | Discharge: 2015-11-19 | Disposition: A | Discharge status: Home / Self Care | Payer: Medicare Other | Source: Ambulatory Visit | Attending: Urology | Admitting: Urology

## 2015-11-19 ENCOUNTER — Other Ambulatory Visit: Payer: Self-pay | Admitting: Urology

## 2015-11-19 DIAGNOSIS — R160 Hepatomegaly, not elsewhere classified: Secondary | ICD-10-CM | POA: Insufficient documentation

## 2015-11-19 DIAGNOSIS — C661 Malignant neoplasm of right ureter: Secondary | ICD-10-CM | POA: Insufficient documentation

## 2015-11-19 DIAGNOSIS — N261 Atrophy of kidney (terminal): Secondary | ICD-10-CM | POA: Diagnosis not present

## 2015-11-19 DIAGNOSIS — C669 Malignant neoplasm of unspecified ureter: Secondary | ICD-10-CM

## 2015-11-19 DIAGNOSIS — N1330 Unspecified hydronephrosis: Secondary | ICD-10-CM | POA: Insufficient documentation

## 2015-11-19 LAB — POCT I-STAT CREATININE: CREATININE: 1.7 mg/dL — AB (ref 0.44–1.00)

## 2015-11-19 MED ORDER — GADOXETATE DISODIUM 0.25 MMOL/ML IV SOLN: 10.0000 mL | Freq: Once | INTRAVENOUS | Status: DC | PRN

## 2015-11-25 ENCOUNTER — Other Ambulatory Visit: Payer: Self-pay | Admitting: Urology

## 2015-11-25 DIAGNOSIS — C669 Malignant neoplasm of unspecified ureter: Secondary | ICD-10-CM

## 2015-12-04 ENCOUNTER — Other Ambulatory Visit: Payer: Self-pay | Admitting: General Surgery

## 2015-12-07 ENCOUNTER — Other Ambulatory Visit: Payer: Self-pay | Admitting: Urology

## 2015-12-07 ENCOUNTER — Ambulatory Visit (HOSPITAL_COMMUNITY)
Admission: RE | Admit: 2015-12-07 | Discharge: 2015-12-07 | Disposition: A | Discharge status: Home / Self Care | Payer: Medicare Other | Source: Ambulatory Visit | Attending: Urology | Admitting: Urology

## 2015-12-07 ENCOUNTER — Encounter (HOSPITAL_COMMUNITY): Payer: Self-pay

## 2015-12-07 DIAGNOSIS — C661 Malignant neoplasm of right ureter: Secondary | ICD-10-CM | POA: Insufficient documentation

## 2015-12-07 DIAGNOSIS — Z955 Presence of coronary angioplasty implant and graft: Secondary | ICD-10-CM | POA: Diagnosis not present

## 2015-12-07 DIAGNOSIS — Z880 Allergy status to penicillin: Secondary | ICD-10-CM | POA: Diagnosis not present

## 2015-12-07 DIAGNOSIS — K769 Liver disease, unspecified: Secondary | ICD-10-CM | POA: Insufficient documentation

## 2015-12-07 DIAGNOSIS — I255 Ischemic cardiomyopathy: Secondary | ICD-10-CM | POA: Insufficient documentation

## 2015-12-07 DIAGNOSIS — E039 Hypothyroidism, unspecified: Secondary | ICD-10-CM | POA: Diagnosis not present

## 2015-12-07 DIAGNOSIS — Z7982 Long term (current) use of aspirin: Secondary | ICD-10-CM | POA: Diagnosis not present

## 2015-12-07 DIAGNOSIS — G8929 Other chronic pain: Secondary | ICD-10-CM | POA: Diagnosis not present

## 2015-12-07 DIAGNOSIS — E1121 Type 2 diabetes mellitus with diabetic nephropathy: Secondary | ICD-10-CM | POA: Diagnosis not present

## 2015-12-07 DIAGNOSIS — N184 Chronic kidney disease, stage 4 (severe): Secondary | ICD-10-CM | POA: Insufficient documentation

## 2015-12-07 DIAGNOSIS — E785 Hyperlipidemia, unspecified: Secondary | ICD-10-CM | POA: Diagnosis not present

## 2015-12-07 DIAGNOSIS — M549 Dorsalgia, unspecified: Secondary | ICD-10-CM | POA: Insufficient documentation

## 2015-12-07 DIAGNOSIS — I2510 Atherosclerotic heart disease of native coronary artery without angina pectoris: Secondary | ICD-10-CM | POA: Diagnosis not present

## 2015-12-07 DIAGNOSIS — I129 Hypertensive chronic kidney disease with stage 1 through stage 4 chronic kidney disease, or unspecified chronic kidney disease: Secondary | ICD-10-CM | POA: Diagnosis not present

## 2015-12-07 DIAGNOSIS — I252 Old myocardial infarction: Secondary | ICD-10-CM | POA: Diagnosis not present

## 2015-12-07 DIAGNOSIS — E1122 Type 2 diabetes mellitus with diabetic chronic kidney disease: Secondary | ICD-10-CM | POA: Insufficient documentation

## 2015-12-07 DIAGNOSIS — C669 Malignant neoplasm of unspecified ureter: Secondary | ICD-10-CM

## 2015-12-07 HISTORY — DX: Malignant (primary) neoplasm, unspecified: C80.1

## 2015-12-07 LAB — CBC
HEMATOCRIT: 33.3 % — AB (ref 36.0–46.0)
HEMOGLOBIN: 11.3 g/dL — AB (ref 12.0–15.0)
MCH: 31.2 pg (ref 26.0–34.0)
MCHC: 33.9 g/dL (ref 30.0–36.0)
MCV: 92 fL (ref 78.0–100.0)
Platelets: 193 10*3/uL (ref 150–400)
RBC: 3.62 MIL/uL — ABNORMAL LOW (ref 3.87–5.11)
RDW: 13.5 % (ref 11.5–15.5)
WBC: 4.1 10*3/uL (ref 4.0–10.5)

## 2015-12-07 LAB — PROTIME-INR
INR: 1.04
Prothrombin Time: 13.6 seconds (ref 11.4–15.2)

## 2015-12-07 LAB — APTT: APTT: 30 s (ref 24–36)

## 2015-12-07 MED ORDER — FLUMAZENIL 0.5 MG/5ML IV SOLN
INTRAVENOUS | Status: AC
  Filled 2015-12-07: qty 5

## 2015-12-07 MED ORDER — FENTANYL CITRATE (PF) 100 MCG/2ML IJ SOLN
INTRAMUSCULAR | Status: AC | PRN
  Administered 2015-12-07 (×3): 25 ug via INTRAVENOUS

## 2015-12-07 MED ORDER — MIDAZOLAM HCL 2 MG/2ML IJ SOLN
INTRAMUSCULAR | Status: AC | PRN
  Administered 2015-12-07 (×3): 0.5 mg via INTRAVENOUS

## 2015-12-07 MED ORDER — FENTANYL CITRATE (PF) 100 MCG/2ML IJ SOLN
INTRAMUSCULAR | Status: AC
  Filled 2015-12-07: qty 2

## 2015-12-07 MED ORDER — SODIUM CHLORIDE 0.9 % IV SOLN
INTRAVENOUS | Status: DC
  Administered 2015-12-07: 11:00:00 via INTRAVENOUS

## 2015-12-07 MED ORDER — MIDAZOLAM HCL 2 MG/2ML IJ SOLN
INTRAMUSCULAR | Status: AC
  Filled 2015-12-07: qty 2

## 2015-12-07 MED ORDER — NALOXONE HCL 0.4 MG/ML IJ SOLN
INTRAMUSCULAR | Status: DC
Start: 2015-12-07 — End: 2015-12-07
  Filled 2015-12-07: qty 1

## 2015-12-07 NOTE — Discharge Instructions (Signed)
Liver Biopsy, Care After °Introduction °These instructions give you information on caring for yourself after your procedure. Your doctor may also give you more specific instructions. Call your doctor if you have any problems or questions after your procedure. °Follow these instructions at home: °· Rest at home for 1-2 days or as told by your doctor. °· Have someone stay with you for at least 24 hours. °· Do not do these things in the first 24 hours: °¨ Drive. °¨ Use machinery. °¨ Take care of other people. °¨ Sign legal documents. °¨ Take a bath or shower. °· There are many different ways to close and cover a cut (incision). For example, a cut can be closed with stitches, skin glue, or adhesive strips. Follow your doctor's instructions on: °¨ Taking care of your cut. °¨ Changing and removing your bandage (dressing). °¨ Removing whatever was used to close your cut. °· Do not drink alcohol in the first week. °· Do not lift more than 5 pounds or play contact sports for the first 2 weeks. °· Take medicines only as told by your doctor. For 1 week, do not take medicine that has aspirin in it or medicines like ibuprofen. °· Get your test results. °Contact a doctor if: °· A cut bleeds and leaves more than just a small spot of blood. °· A cut is red, puffs up (swells), or hurts more than before. °· Fluid or something else comes from a cut. °· A cut smells bad. °· You have a fever or chills. °Get help right away if: °· You have swelling, bloating, or pain in your belly (abdomen). °· You get dizzy or faint. °· You have a rash. °· You feel sick to your stomach (nauseous) or throw up (vomit). °· You have trouble breathing, feel short of breath, or feel faint. °· Your chest hurts. °· You have problems talking or seeing. °· You have trouble balancing or moving your arms or legs. °This information is not intended to replace advice given to you by your health care provider. Make sure you discuss any questions you have with your  health care provider. °Document Released: 09/29/2007 Document Revised: 05/28/2015 Document Reviewed: 02/15/2013 °© 2017 Elsevier °Moderate Conscious Sedation, Adult, Care After °These instructions provide you with information about caring for yourself after your procedure. Your health care provider may also give you more specific instructions. Your treatment has been planned according to current medical practices, but problems sometimes occur. Call your health care provider if you have any problems or questions after your procedure. °What can I expect after the procedure? °After your procedure, it is common: °· To feel sleepy for several hours. °· To feel clumsy and have poor balance for several hours. °· To have poor judgment for several hours. °· To vomit if you eat too soon. °Follow these instructions at home: °For at least 24 hours after the procedure:  °· Do not: °¨ Participate in activities where you could fall or become injured. °¨ Drive. °¨ Use heavy machinery. °¨ Drink alcohol. °¨ Take sleeping pills or medicines that cause drowsiness. °¨ Make important decisions or sign legal documents. °¨ Take care of children on your own. °· Rest. °Eating and drinking °· Follow the diet recommended by your health care provider. °· If you vomit: °¨ Drink water, juice, or soup when you can drink without vomiting. °¨ Make sure you have little or no nausea before eating solid foods. °General instructions °· Have a responsible adult stay with you until   you are awake and alert. °· Take over-the-counter and prescription medicines only as told by your health care provider. °· If you smoke, do not smoke without supervision. °· Keep all follow-up visits as told by your health care provider. This is important. °Contact a health care provider if: °· You keep feeling nauseous or you keep vomiting. °· You feel light-headed. °· You develop a rash. °· You have a fever. °Get help right away if: °· You have trouble breathing. °This  information is not intended to replace advice given to you by your health care provider. Make sure you discuss any questions you have with your health care provider. °Document Released: 10/10/2012 Document Revised: 05/25/2015 Document Reviewed: 04/11/2015 °Elsevier Interactive Patient Education © 2017 [employer]vier Inc. ° °

## 2015-12-07 NOTE — Procedures (Signed)
Technically successful US guided biopsy of indeterminate lesion in the right lobe of the liver.   EBL: None No immediate complications.   Jay Kaylob Wallen, MD Pager #: [phone]   

## 2015-12-07 NOTE — H&P (Signed)
Chief Complaint: liver lesion  Referring Physician:Dr. Nicolette Bang  Supervising Physician: Sandi Mariscal  Patient Status: [facility] - Out-pt  HPI: Joann Hunter is an 82 y.o. female who has a history of right ureteral carcinoma.  This was found second to recurrent UTIs.  She is followed by Dr. Alyson Ingles.  On a recent noncontrasted CT of the abdomen she was found to have a liver lesion.  An MR was then obtained to further characterize this lesion.  She was found to have a 3.2 cm mass in segment 4B of the left hepatic lobe suspicious for a liver met.  She has been scheduled for a liver biopsy and presents for this today.  She denies any complaints today except for chronic leg pain in both of her legs.  Past Medical History:  Past Medical History:  Diagnosis Date  . Arthritis   . Back pain, chronic   . CAD (coronary artery disease)    NSTEMI 04/2011 with newly diagnosed 3V CAD s/p PCI/DES mLAD 04/11/11 with residual dz (per Dr. Burt Knack, should have consideration of PCI of the occluded RCA based on symptoms and/or consideration of outpatient nuclear perfusion testing after the patient recovers from her infarct)  . Cancer ([place])    kidney ( Nov.2017)  . Chronic kidney disease (CKD), stage IV (severe) ([place]) 12/13/2013  . Hyperglycemia   . Hyperlipidemia   . Hypertension   . Hypothyroidism   . Ischemic cardiomyopathy    EF 45% by cath 04/20/11, 50-55% by echo 04/22/11. hypotension limiting medication titration   . Myocardial infarction 2013  . Type 2 diabetes with nephropathy ([place]) 07/26/2014  . UTI (urinary tract infection) 12/13/2013    Past Surgical History:  Past Surgical History:  Procedure Laterality Date  . BACK SURGERY    . BREAST LUMPECTOMY     right  . CORONARY STENT PLACEMENT    . LEFT HEART CATHETERIZATION WITH CORONARY ANGIOGRAM N/A 04/20/2011   Procedure: LEFT HEART CATHETERIZATION WITH CORONARY ANGIOGRAM;  Surgeon: Burnell Blanks, MD;  Location: [facility] C[facility] LAB;   Service: Cardiovascular;  Laterality: N/A;  . PERCUTANEOUS CORONARY STENT INTERVENTION (PCI-S) N/A 04/21/2011   Procedure: PERCUTANEOUS CORONARY STENT INTERVENTION (PCI-S);  Surgeon: Sherren Mocha, MD;  Location: [facility] CATH LAB;  Service: Cardiovascular;  Laterality: N/A;    Family History:  Family History  Problem Relation Age of Onset  . Cancer Father   . Other      no known family cardiac disease    Social History:  reports that she has never smoked. She has never used smokeless tobacco. She reports that she does not drink alcohol or use drugs.  Allergies:  Allergies  Allergen Reactions  . Ciprofloxacin Other (See Comments)    Hallucination  . Lipitor [Atorvastatin] Other (See Comments)    Muscle Weakness  . Penicillins Rash    Has patient had a PCN reaction causing immediate rash, facial/tongue/throat swelling, SOB or lightheadedness with hypotension: unknown Has patient had a PCN reaction causing severe rash involving mucus membranes or skin necrosis: unknown Has patient had a PCN reaction that required hospitalization: unknown Has patient had a PCN reaction occurring within the last 10 years: unknown If all of the above answers are "NO", then may proceed with Cephalosporin use. 2    Medications: Medications reviewed in epic  Please HPI for pertinent positives, otherwise complete 10 system ROS negative.  Mallampati Score: MD Evaluation Airway: WNL Heart: WNL Abdomen: WNL Chest/ Lungs: WNL ASA  Classification: 3  Mallampati/Airway Score: Three  Physical Exam: BP (!) 146/55   Pulse (!) 54   Temp 98.6 F (37 C) (Oral)   Resp 16   Ht _0  (1.575 m)   Wt 142 lb (64.4 kg)   SpO2 96%   BMI 25.97 kg/m  Body mass index is 25.97 kg/m. General: pleasant, elderly white female who is laying in bed in NAD HEENT: head is normocephalic, atraumatic.  Sclera are noninjected.  PERRL.  Ears and nose without any masses or lesions.  Mouth is pink and moist Heart: regular, rate,  and rhythm.  Normal s1,s2. No obvious murmurs, gallops, or rubs noted.  Palpable radial and pedal pulses bilaterally Lungs: few rhonchi noted, but otherwise no suspicious sounds heard.  Respiratory effort nonlabored Abd: soft, NT, ND, +BS, no masses, hernias, or organomegaly Psych: A&Ox3 with an appropriate affect.   Labs: Results for orders placed or performed during the hospital encounter of 12/07/15 (from the past 48 hour(s))  APTT upon arrival     Status: None   Collection Time: 12/07/15 11:24 AM  Result Value Ref Range   aPTT 30 24 - 36 seconds  CBC upon arrival     Status: Abnormal   Collection Time: 12/07/15 11:24 AM  Result Value Ref Range   WBC 4.1 4.0 - 10.5 K/uL   RBC 3.62 (L) 3.87 - 5.11 MIL/uL   Hemoglobin 11.3 (L) 12.0 - 15.0 g/dL   HCT 33.3 (L) 36.0 - 46.0 %   MCV 92.0 78.0 - 100.0 fL   MCH 31.2 26.0 - 34.0 pg   MCHC 33.9 30.0 - 36.0 g/dL   RDW 13.5 11.5 - 15.5 %   Platelets 193 150 - 400 K/uL  Protime-INR upon arrival     Status: None   Collection Time: 12/07/15 11:24 AM  Result Value Ref Range   Prothrombin Time 13.6 11.4 - 15.2 seconds   INR 1.04     Imaging: No results found.  Assessment/Plan 1. Right ureteral cancer with a liver lesion -we will plan to proceed with a biopsy of this liver lesion to help determine if she has metastatic disease or not. -labs and vitals reviewed -Risks and Benefits discussed with the patient including, but not limited to bleeding, infection, damage to adjacent structures or low yield requiring additional tests. All of the patient's questions were answered, patient is agreeable to proceed. Consent signed and in chart.  Thank you for this interesting consult.  I greatly enjoyed meeting KYNSLEY WH[facility]SE and look forward to participating in their care.  A copy of this report was sent to the requesting provider on this date.  Electronically Signed: Henreitta Cea 12/07/2015, 12:27 PM   I spent a total of  30 Minutes   in  face to face in clinical consultation, greater than 50% of which was counseling/coordinating care for liver lesion

## 2015-12-14 ENCOUNTER — Encounter: Payer: Self-pay | Admitting: Oncology

## 2015-12-18 ENCOUNTER — Ambulatory Visit (HOSPITAL_BASED_OUTPATIENT_CLINIC_OR_DEPARTMENT_OTHER): Payer: Medicare Other | Admitting: Oncology

## 2015-12-18 VITALS — BP 125/47 | HR 56 | Temp 97.7°F | Resp 16 | Ht 62.0 in | Wt 158.6 lb

## 2015-12-18 DIAGNOSIS — C669 Malignant neoplasm of unspecified ureter: Secondary | ICD-10-CM

## 2015-12-18 DIAGNOSIS — I10 Essential (primary) hypertension: Secondary | ICD-10-CM | POA: Diagnosis not present

## 2015-12-18 DIAGNOSIS — E039 Hypothyroidism, unspecified: Secondary | ICD-10-CM | POA: Diagnosis not present

## 2015-12-18 DIAGNOSIS — N184 Chronic kidney disease, stage 4 (severe): Secondary | ICD-10-CM

## 2015-12-18 NOTE — Progress Notes (Signed)
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   HPI: 82 year old woman with history of hypertension, hyperlipidemia and coronary artery disease. She has been noted to have recurrent UTI and hematuria but subsequently underwent a cystoscopy and retrograde evaluation by Dr. Exie Parody in October 2017. Retrograde evaluation showed multiple filling defects in the distal right ureter. There is marked irregularity of the proximal right ureter specialist for malignant process. She also had an ultrasound which showed hydronephrosis and her cystoscopy on 10/22/2015 showed a tumor noted out of the ureteral orifice. MRI of the abdomen obtained on 11/19/2015 showed a 3.2 cm mass of the left hepatic lobe suspicious for liver metastasis. Moderate right hydronephrosis was also noted. No other sites of abdominal metastasis noted. She underwent a biopsy of her liver lesion on 12/07/2015 which showed metastatic carcinoma consistent with transitional cell histology. She was referred to me for evaluation regarding these findings. Clinically, she is asymptomatic from this. She is no longer reporting any hematuria, dysuria or abdominal pain. She does not report any flank pain. Her performance status is limited related to arthritis. She ambulates short distances inside her house. She relies on her daughter for most activities of daily living.  She does not report any headaches, blurry vision, syncope or seizures. She does not report any fevers, chills, sweats or weight loss. Her appetite have not dramatically changed. She does not report any chest pain, palpitation, orthopnea or leg edema. She does not report any cough, wheezing or hemoptysis. She does not report any nausea, vomiting or abdominal pain. She is not report any constipation, diarrhea. She does not report any skeletal complaints. Remaining review of systems unremarkable.   Past Medical History:  Diagnosis Date  . Arthritis   . Back pain, chronic   . CAD (coronary artery  disease)    NSTEMI 04/2011 with newly diagnosed 3V CAD s/p PCI/DES mLAD 04/11/11 with residual dz (per Dr. Burt Knack, should have consideration of PCI of the occluded RCA based on symptoms and/or consideration of outpatient nuclear perfusion testing after the patient recovers from her infarct)  . Cancer ([place])    kidney ( Nov.2017)  . Chronic kidney disease (CKD), stage IV (severe) ([place]) 12/13/2013  . Hyperglycemia   . Hyperlipidemia   . Hypertension   . Hypothyroidism   . Ischemic cardiomyopathy    EF 45% by cath 04/20/11, 50-55% by echo 04/22/11. hypotension limiting medication titration   . Myocardial infarction 2013  . Type 2 diabetes with nephropathy ([place]) 07/26/2014  . UTI (urinary tract infection) 12/13/2013  :  Past Surgical History:  Procedure Laterality Date  . BACK SURGERY    . BREAST LUMPECTOMY     right  . CORONARY STENT PLACEMENT    . LEFT HEART CATHETERIZATION WITH CORONARY ANGIOGRAM N/A 04/20/2011   Procedure: LEFT HEART CATHETERIZATION WITH CORONARY ANGIOGRAM;  Surgeon: Burnell Blanks, MD;  Location[facility] CATH LAB;  Service: Cardiovascular;  Laterality: N/A;  . PERCUTANEOUS CORONARY STENT INTERVENTION (PCI-S) N/A 04/21/2011   Procedure: PERCUTANEOUS CORONARY STENT INTERVENTION (PCI-S);  Surgeon: Sherren Mocha, MD;  Location: [facility] CATH LAB;  Service: Cardiovascular;  Laterality: N/A;  :   Current Outpatient Prescriptions:  .  amLODipine (NORVASC) 10 MG tablet, Take 0.5 tablets (5 mg total) by mouth daily., Disp: , Rfl:  .  aspirin 81 MG chewable tablet, Chew 81 mg by mouth daily. , Disp: , Rfl:  .  docusate sodium (COLACE) 100 MG capsule, Take 1 capsule (100 mg total) by mouth 2 (two) times daily., Disp: 60  capsule, Rfl: 0 .  fish oil-omega-3 fatty acids 1000 MG capsule, Take 1 g by mouth daily., Disp: , Rfl:  .  ibuprofen (ADVIL,MOTRIN) 200 MG tablet, Take 200 mg by mouth every 6 (six) hours as needed for moderate pain., Disp: , Rfl:  .  levothyroxine (SYNTHROID,  LEVOTHROID) 112 MCG tablet, Take 1 tablet (112 mcg total) by mouth every morning., Disp: 30 tablet, Rfl: 0 .  meclizine (ANTIVERT) 25 MG tablet, Take 25 mg by mouth 4 (four) times daily as needed. Dizziness, Disp: , Rfl:  .  metoprolol tartrate (LOPRESSOR) 25 MG tablet, Take 25 mg by mouth 2 (two) times daily. , Disp: , Rfl:  .  nitrofurantoin, macrocrystal-monohydrate, (MACROBID) 100 MG capsule, Take 1 capsule (100 mg total) by mouth 2 (two) times daily., Disp: 14 capsule, Rfl: 0 .  nitroGLYCERIN (NITROSTAT) 0.4 MG SL tablet, Place 0.4 mg under the tongue every 5 (five) minutes x 3 doses as needed. For chest pain., Disp: , Rfl:  .  ondansetron (ZOFRAN-ODT) 4 MG disintegrating tablet, Take 4 mg by mouth every 8 (eight) hours as needed for nausea or vomiting., Disp: , Rfl:  .  pantoprazole (PROTONIX) 40 MG tablet, Take 1 tablet (40 mg total) by mouth daily., Disp: 30 tablet, Rfl: 0 .  traMADol (ULTRAM) 50 MG tablet, Take 50 mg by mouth 3 (three) times daily. , Disp: , Rfl:  .  Vitamin D, Ergocalciferol, (DRISDOL) 50000 UNITS CAPS, Take 50,000 Units by mouth 2 (two) times a week. Tuesdays and Thurdays, Disp: , Rfl: :  Allergies  Allergen Reactions  . Ciprofloxacin Other (See Comments)    Hallucination  . Lipitor [Atorvastatin] Other (See Comments)    Muscle Weakness  . Penicillins Rash    Has patient had a PCN reaction causing immediate rash, facial/tongue/throat swelling, SOB or lightheadedness with hypotension: unknown Has patient had a PCN reaction causing severe rash involving mucus membranes or skin necrosis: unknown Has patient had a PCN reaction that required hospitalization: unknown Has patient had a PCN reaction occurring within the last 10 years: unknown If all of the above answers are "NO", then may proceed with Cephalosporin use. 2  :  Family History  Problem Relation Age of Onset  . Cancer Father   . Other      no known family cardiac disease  :  Social History   Social  History  . Marital status: Married    Spouse name: N/A  . Number of children: N/A  . Years of education: N/A   Occupational History  . Not on file.   Social History Main Topics  . Smoking status: Never Smoker  . Smokeless tobacco: Never Used  . Alcohol use No  . Drug use: No  . Sexual activity: No   Other Topics Concern  . Not on file   Social History Narrative   Lives in [place], [place] with her husband and daughter.  :  Pertinent items are noted in HPI.  Exam: Blood pressure (!) 125/47, pulse (!) 56, temperature 97.7 F (36.5 C), temperature source Oral, resp. rate 16, height 5\' 2"  (1.575 m), weight 158 lb 9.6 oz (71.9 kg), SpO2 99 %.  ECOG 2 General appearance: alert and cooperative elderly woman without distress. Head: Normocephalic, without obvious abnormality Throat: lips, mucosa, and tongue normal; teeth and gums normal Neck: no adenopathy Back: negative Resp: clear to auscultation bilaterally Cardio: regular rate and rhythm, S1, S2 normal, no murmur, click, rub or gallop GI: soft, non-tender; bowel  sounds normal; no masses,  no organomegaly. No shifting dullness or ascites. Extremities: extremities normal, atraumatic, no cyanosis or edema Pulses: 2+ and symmetric Skin: Skin color, texture, turgor normal. No rashes or lesions  CBC    Component Value Date/Time   WBC 4.1 12/07/2015 1124   RBC 3.62 (L) 12/07/2015 1124   HGB 11.3 (L) 12/07/2015 1124   HCT 33.3 (L) 12/07/2015 1124   PLT 193 12/07/2015 1124   MCV 92.0 12/07/2015 1124   MCH 31.2 12/07/2015 1124   MCHC 33.9 12/07/2015 1124   RDW 13.5 12/07/2015 1124   LYMPHSABS 1.0 09/04/2015 1758   MONOABS 0.6 09/04/2015 1758   EOSABS 0.3 09/04/2015 1758   BASOSABS 0.0 09/04/2015 1758      Chemistry      Component Value Date/Time   NA 133 (L) 09/05/2015 0554   K 3.8 09/05/2015 0554   CL 104 09/05/2015 0554   CO2 22 09/05/2015 0554   BUN 18 09/05/2015 0554   CREATININE 1.70 (H) 11/19/2015 1127       Component Value Date/Time   CALCIUM 8.7 (L) 09/05/2015 0554   ALKPHOS 70 07/11/2015 1617   AST 16 07/11/2015 1617   ALT 10 (L) 07/11/2015 1617   BILITOT 0.5 07/11/2015 1617        Mr Abdomen Wo Contrast  Result Date: 11/19/2015 CLINICAL DATA:  Liver lesion seen on noncontrast CT. Right ureteral carcinoma. EXAM: MRI ABDOMEN WITHOUT CONTRAST TECHNIQUE: Multiplanar multisequence MR imaging was performed without the administration of intravenous contrast. COMPARISON:  Noncontrast CT on 10/28/2015 and 12/11/2013 FINDINGS: Lower chest: No acute findings. Hepatobiliary: A solitary mass is seen in segment 4B of the left hepatic lobe which measures 2.3 x 3.2 cm on image 18/4. This shows T1 hypointensity and T2 hyperintensity. Characterization is limited by lack of intravenous contrast on this exam, however restricted diffusion is noted. This features are suspicious for liver metastasis. No other liver masses are identified. Gallbladder is unremarkable. No evidence of biliary ductal dilatation. Pancreas: No mass or inflammatory process visualized on this unenhanced exam. Spleen:  Within normal limits in size. Adrenals/Urinary tract: No adrenal masses identified. Left kidney is normal in appearance. The right kidney shows diffuse parenchymal atrophy and moderate hydronephrosis. Abnormal heterogeneous signal intensity is seen within the renal collecting system and proximal right ureter, consistent with patient's history of urothelial carcinoma. Stomach/Bowel: No evidence of obstruction, inflammatory process, or abnormal fluid collections. Vascular/Lymphatic: No pathologically enlarged lymph nodes identified. No evidence of abdominal aortic aneurysm. Other:  None. Musculoskeletal:  No suspicious bone lesions identified. IMPRESSION: 3.2 cm mass in segment 4B of the left hepatic lobe, suspicious for liver metastasis. Consider percutaneous needle biopsy for tissue diagnosis. Moderate right hydronephrosis and diffuse  renal parenchymal atrophy. Abnormal heterogeneous signal intensity throughout the right renal collecting system and proximal right ureter, consistent with known urothelial carcinoma. No other sites of abdominal metastatic disease identified. Electronically Signed   By: Earle Gell M.D.   On: 11/19/2015 15:24   US Biopsy  Result Date: 12/07/2015 INDICATION: History of malignant neoplasm of the ureter now with indeterminate liver lesion worrisome for metastatic disease. Please perform ultrasound-guided biopsy for tissue diagnostic purposes. EXAM: ULTRASOUND GUIDED LIVER LESION BIOPSY COMPARISON:  CT abdomen pelvis - 10/28/2015; abdominal MRI - 11/19/2015 MEDICATIONS: None ANESTHESIA/SEDATION: Fentanyl 75 mcg IV; Versed 1.5 mg IV Total Moderate Sedation time: 16 minutes. The patient's level of consciousness and vital signs were monitored continuously by radiology nursing throughout the procedure under my direct supervision.  COMPLICATIONS: None immediate. PROCEDURE: Informed written consent was obtained from the patient after a discussion of the risks, benefits and alternatives to treatment. The patient understands and consents the procedure. A timeout was performed prior to the initiation of the procedure. Ultrasound scanning was performed of the right upper abdominal quadrant demonstrates a ill-defined approximately 2.9 x 2.5 cm mixed echogenic solid lesion within the subcapsular aspect of the medial segment of the left lobe of the liver (image 4) which correlates with the lesions seen on preceding abdominal CT and MRI. Note this lesion was difficult to visualize with ultrasound secondary to location deep to several overlapping ribs a located near the dome of the liver. The procedure was planned. The right upper abdominal quadrant was prepped and draped in the usual sterile fashion. The overlying soft tissues were anesthetized with 1% lidocaine with epinephrine. A 17 gauge, 6.8 cm co-axial needle was advanced into  a peripheral aspect of the lesion. This was followed by 4 core biopsies with an 18 gauge core device under direct ultrasound guidance. The coaxial needle tract was embolized with a small amount of Gel-Foam slurry and superficial hemostasis was obtained with manual compression. Post procedural scanning was negative for definitive area of hemorrhage or additional complication. A dressing was placed. The patient tolerated the procedure well without immediate post procedural complication. IMPRESSION: Technically difficult though ultimately successful ultrasound guided core needle biopsy of indeterminate lesion within subcapsular aspect of the medial segment of left lobe of the liver. Electronically Signed   By: Sandi Mariscal M.D.   On: 12/07/2015 15:20    Assessment and Plan:   82 year old woman with the following issues:  1. 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 Her staging workup revealed hepatic metastasis that has been biopsy proven on 12/07/2015. She is completely asymptomatic from these findings at this time.  The natural course of this disease was discussed with the patient and her daughters. I see no curative surgical options at this point given the advanced nature of her malignancy as well as her age and comorbid conditions. The only option moving forward at this time would be palliative systemic therapy. She is not a cisplatin given her renal insufficiency as well as other comorbid conditions and performance status. It would be reasonable to consider immunotherapy instead as first line given her platinum and eligibility.  The role of systemic therapy would be to palliate her cancer or any other symptoms. Alternatively, supportive care only without any systemic treatment is also an option. She is pain-free and any treatment at this time could be worse than her actual disease. I anticipate however her disease  will progress and she will become symptomatic in the near future.  Her prognosis was also discussed today in detail with her family. I feel that she has limited life expectancy likely in the months rather than the years. Palliative systemic therapy can improve that slightly but not dramatically.  I have recommended that she consider these options which include supportive care only versus systemic therapy and make a decision in the near future. I do not see any urgency to starting any therapy at this time given her low volume disease.  For logistical purposes, she is closer to [facility] and would be more convenient for her and her family to follow-up there. I will arrange further oncology follow-up with Dr. Whitney Muse for her convenience.  2. Follow-up: I'm happy to see her in  the future as needed.

## 2016-01-01 ENCOUNTER — Encounter (HOSPITAL_COMMUNITY): Payer: Medicare Other | Attending: Hematology & Oncology | Admitting: Hematology & Oncology

## 2016-01-01 ENCOUNTER — Telehealth: Payer: Self-pay | Admitting: Oncology

## 2016-01-01 ENCOUNTER — Encounter (HOSPITAL_COMMUNITY): Payer: Self-pay | Admitting: Hematology & Oncology

## 2016-01-01 DIAGNOSIS — C787 Secondary malignant neoplasm of liver and intrahepatic bile duct: Secondary | ICD-10-CM

## 2016-01-01 DIAGNOSIS — Z7189 Other specified counseling: Secondary | ICD-10-CM

## 2016-01-01 DIAGNOSIS — C689 Malignant neoplasm of urinary organ, unspecified: Secondary | ICD-10-CM | POA: Diagnosis not present

## 2016-01-01 DIAGNOSIS — N189 Chronic kidney disease, unspecified: Secondary | ICD-10-CM | POA: Diagnosis not present

## 2016-01-01 DIAGNOSIS — N184 Chronic kidney disease, stage 4 (severe): Secondary | ICD-10-CM

## 2016-01-01 NOTE — Telephone Encounter (Signed)
Faxed office note to [facility]

## 2016-01-01 NOTE — Progress Notes (Signed)
Met with pt face to face.  Introduced myself and explain a little bit about my role as the patient navigator.  Pt given a card with all my information on it.  Told pt to call if they had any questions or concerns.  Pt verbalized understanding.  Information on bladder cancer and tecintriq given.

## 2016-01-01 NOTE — Progress Notes (Signed)
[place] NOTE  Patient Care Team: Monico Blitz, MD as PCP - General (Internal Medicine)  CHIEF COMPLAINTS/PURPOSE OF CONSULTATION:  Stage IV urothelial carcinoma  HISTORY OF PRESENTING ILLNESS:  Joann Hunter 82 y.o. female is here because of referral from Dr. Alen Blew for ureteral cancer. The patient lives closer to [facility] and felt it would be more convenient for her and her family to follow-up in [place].  Per Dr. Hazeline Junker note on 12/18/2015: "82 year old woman with history of hypertension, hyperlipidemia and coronary artery disease. She has been noted to have recurrent UTI and hematuria but subsequently underwent a cystoscopy and retrograde evaluation by Dr. Exie Parody in October 2017. Retrograde evaluation showed multiple filling defects in the distal right ureter. There is marked irregularity of the proximal right ureter specialist for malignant process. She also had an ultrasound which showed hydronephrosis and her cystoscopy on 10/22/2015 showed a tumor noted out of the ureteral orifice. MRI of the abdomen obtained on 11/19/2015 showed a 3.2 cm mass of the left hepatic lobe suspicious for liver metastasis. Moderate right hydronephrosis was also noted. No other sites of abdominal metastasis noted. She underwent a biopsy of her liver lesion on 12/07/2015 which showed metastatic carcinoma consistent with transitional cell histology. She was referred to me for evaluation regarding these findings. Clinically, she is asymptomatic from this. She is no longer reporting any hematuria, dysuria or abdominal pain. She does not report any flank pain. Her performance status is limited related to arthritis. She ambulates short distances inside her house. She relies on her daughter for most activities of daily living."  Mr. Glassburn presents to the [facility] today accompanied by her daughter, Joann Hunter. She presents in wheelchair. The patient lives with her daughter  and [name].  She had a good Christmas, although she thinks she caught a cold from one of her relatives.   Her appetite is good. She denies any new pain. She denies hematuria. She is still able to do all the things she used to do and doesn't feel like she has slowed down.   The patient is here to establish care of urothelial cancer. In regards to therapy, she does not know if she want to do any treatment.   MEDICAL HISTORY:  Past Medical History:  Diagnosis Date  . Arthritis   . Back pain, chronic   . CAD (coronary artery disease)    NSTEMI 04/2011 with newly diagnosed 3V CAD s/p PCI/DES mLAD 04/11/11 with residual dz (per Dr. Burt Knack, should have consideration of PCI of the occluded RCA based on symptoms and/or consideration of outpatient nuclear perfusion testing after the patient recovers from her infarct)  . Cancer ([place])    kidney ( Nov.2017)  . Chronic kidney disease (CKD), stage IV (severe) ([place]) 12/13/2013  . Hyperglycemia   . Hyperlipidemia   . Hypertension   . Hypothyroidism   . Ischemic cardiomyopathy    EF 45% by cath 04/20/11, 50-55% by echo 04/22/11. hypotension limiting medication titration   . Myocardial infarction 2013  . Type 2 diabetes with nephropathy ([place]) 07/26/2014  . UTI (urinary tract infection) 12/13/2013    SURGICAL HISTORY: Past Surgical History:  Procedure Laterality Date  . BACK SURGERY    . BREAST LUMPECTOMY     right  . CORONARY STENT PLACEMENT    . LEFT HEART CATHETERIZATION WITH CORONARY ANGIOGRAM N/A 04/20/2011   Procedure: LEFT HEART CATHETERIZATION WITH CORONARY ANGIOGRAM;  Surgeon: Burnell Blanks, MD;  Location:  [facility] CATH LAB;  Service: Cardiovascular;  Laterality: N/A;  . PERCUTANEOUS CORONARY STENT INTERVENTION (PCI-S) N/A 04/21/2011   Procedure: PERCUTANEOUS CORONARY STENT INTERVENTION (PCI-S);  Surgeon: Sherren Mocha, MD;  Location: [facility] CATH LAB;  Service: Cardiovascular;  Laterality: N/A;    SOCIAL HISTORY: Social History    Social History  . Marital status: Married    Spouse name: N/A  . Number of children: N/A  . Years of education: N/A   Occupational History  . Not on file.   Social History Main Topics  . Smoking status: Never Smoker  . Smokeless tobacco: Never Used  . Alcohol use No  . Drug use: No  . Sexual activity: No     Comment: married 61 years-husband at [employer]   Other Topics Concern  . Not on file   Social History Narrative   Lives in [place], [place] with her husband and daughter.   Married for 31 years; her husband is in the [place] 2 children 4 grandchildren 75 great grandchildren [place] She spoke fondly of how she grew up. She was born in [place].  FAMILY HISTORY: Family History  Problem Relation Age of Onset  . Other Mother   . Cancer Father   . Other      no known family cardiac disease  . Other Brother     Diptheria  . Depression Sister   . Pneumonia Sister   . Other Brother     murdered   Mother deceased at 21 years-old during child birth Father deceased at 38 years-old. Believes he died of old age. He had "rheumatism real bad". 8 siblings.   ALLERGIES:  is allergic to ciprofloxacin; lipitor [atorvastatin]; and penicillins.  MEDICATIONS:  Current Outpatient Prescriptions  Medication Sig Dispense Refill  . amLODipine (NORVASC) 10 MG tablet Take 0.5 tablets (5 mg total) by mouth daily.    Marland Kitchen aspirin 81 MG chewable tablet Chew 81 mg by mouth daily.     Marland Kitchen docusate sodium (COLACE) 100 MG capsule Take 1 capsule (100 mg total) by mouth 2 (two) times daily. 60 capsule 0  . fish oil-omega-3 fatty acids 1000 MG capsule Take 1 g by mouth daily.    Marland Kitchen ibuprofen (ADVIL,MOTRIN) 200 MG tablet Take 200 mg by mouth every 6 (six) hours as needed for moderate pain.    Marland Kitchen levothyroxine (SYNTHROID, LEVOTHROID) 112 MCG tablet Take 1 tablet (112 mcg total) by mouth every morning. 30 tablet 0  . meclizine (ANTIVERT) 25 MG tablet Take 25 mg by  mouth 4 (four) times daily as needed. Dizziness    . metoprolol tartrate (LOPRESSOR) 25 MG tablet Take 25 mg by mouth 2 (two) times daily.     . nitrofurantoin, macrocrystal-monohydrate, (MACROBID) 100 MG capsule Take 1 capsule (100 mg total) by mouth 2 (two) times daily. 14 capsule 0  . nitroGLYCERIN (NITROSTAT) 0.4 MG SL tablet Place 0.4 mg under the tongue every 5 (five) minutes x 3 doses as needed. For chest pain.    Marland Kitchen ondansetron (ZOFRAN-ODT) 4 MG disintegrating tablet Take 4 mg by mouth every 8 (eight) hours as needed for nausea or vomiting.    . pantoprazole (PROTONIX) 40 MG tablet Take 1 tablet (40 mg total) by mouth daily. 30 tablet 0  . traMADol (ULTRAM) 50 MG tablet Take 50 mg by mouth 3 (three) times daily.     . Vitamin D, Ergocalciferol, (DRISDOL) 50000 UNITS CAPS Take 50,000 Units by mouth 2 (two) times  a week. Tuesdays and Thurdays     No current facility-administered medications for this visit.     Review of Systems  Constitutional: Negative.  Negative for weight loss.  HENT: Negative.   Eyes: Negative.   Respiratory: Negative.   Cardiovascular: Negative.   Gastrointestinal: Negative.   Genitourinary: Negative.  Negative for hematuria.  Musculoskeletal: Negative.   Skin: Negative.   Neurological: Negative.   Endo/Heme/Allergies: Negative.   Psychiatric/Behavioral: Negative.   All other systems reviewed and are negative. 14 point ROS was done and is otherwise as detailed above or in HPI   PHYSICAL EXAMINATION: ECOG PERFORMANCE STATUS: 2 - Symptomatic, <50% confined to bed  Vitals - 1 value per visit 95/63/8756  SYSTOLIC 433  DIASTOLIC 47  Hunter 56  Temperature 97.7  Respirations 16  Weight (lb) 158.6  Height '5\' 2"'$   BMI 29.01    Physical Exam  Constitutional: She is oriented to person, place, and time and well-developed, well-nourished, and in no distress.  Able to get on exam table with assistance  HENT:  Head: Normocephalic and atraumatic.   Mouth/Throat: Oropharynx is clear and moist.  Eyes: Conjunctivae and EOM are normal. Pupils are equal, round, and reactive to light. No scleral icterus.  Neck: Normal range of motion. Neck supple.  Cardiovascular: Normal rate, regular rhythm and normal heart sounds.   Pulmonary/Chest: Effort normal and breath sounds normal.  Abdominal: Soft. Bowel sounds are normal. She exhibits no distension and no mass. There is no tenderness. There is no rebound and no guarding.  Musculoskeletal: Normal range of motion.  Lymphadenopathy:    She has no cervical adenopathy.  Neurological: She is alert and oriented to person, place, and time. Gait normal.  Skin: Skin is warm and dry.  Psychiatric: Mood, memory, affect and judgment normal.  Nursing note and vitals reviewed.   LABORATORY DATA:  I have reviewed the data as listed Lab Results  Component Value Date   WBC 4.1 12/07/2015   HGB 11.3 (L) 12/07/2015   HCT 33.3 (L) 12/07/2015   MCV 92.0 12/07/2015   PLT 193 12/07/2015   CMP     Component Value Date/Time   NA 133 (L) 09/05/2015 0554   K 3.8 09/05/2015 0554   CL 104 09/05/2015 0554   CO2 22 09/05/2015 0554   GLUCOSE 167 (H) 09/05/2015 0554   BUN 18 09/05/2015 0554   CREATININE 1.70 (H) 11/19/2015 1127   CALCIUM 8.7 (L) 09/05/2015 0554   PROT 6.8 07/11/2015 1617   ALBUMIN 3.5 07/11/2015 1617   AST 16 07/11/2015 1617   ALT 10 (L) 07/11/2015 1617   ALKPHOS 70 07/11/2015 1617   BILITOT 0.5 07/11/2015 1617   GFRNONAA 33 (L) 09/05/2015 0554   GFRAA 38 (L) 09/05/2015 0554   PATHOLOGY   RADIOGRAPHIC STUDIES: I have personally reviewed the radiological images as listed and agreed with the findings in the report. Study Result   CLINICAL DATA:  Liver lesion seen on noncontrast CT. Right ureteral carcinoma.  EXAM: MRI ABDOMEN WITHOUT CONTRAST  TECHNIQUE: Multiplanar multisequence MR imaging was performed without the administration of intravenous contrast.  COMPARISON:   Noncontrast CT on 10/28/2015 and 12/11/2013  FINDINGS: Lower chest: No acute findings.  Hepatobiliary: A solitary mass is seen in segment 4B of the left hepatic lobe which measures 2.3 x 3.2 cm on image 18/4. This shows T1 hypointensity and T2 hyperintensity. Characterization is limited by lack of intravenous contrast on this exam, however restricted diffusion is noted. This features are  suspicious for liver metastasis. No other liver masses are identified.  Gallbladder is unremarkable. No evidence of biliary ductal dilatation.  Pancreas: No mass or inflammatory process visualized on this unenhanced exam.  Spleen:  Within normal limits in size.  Adrenals/Urinary tract: No adrenal masses identified. Left kidney is normal in appearance. The right kidney shows diffuse parenchymal atrophy and moderate hydronephrosis. Abnormal heterogeneous signal intensity is seen within the renal collecting system and proximal right ureter, consistent with patient's history of urothelial carcinoma.  Stomach/Bowel: No evidence of obstruction, inflammatory process, or abnormal fluid collections.  Vascular/Lymphatic: No pathologically enlarged lymph nodes identified. No evidence of abdominal aortic aneurysm.  Other:  None.  Musculoskeletal:  No suspicious bone lesions identified.  IMPRESSION: 3.2 cm mass in segment 4B of the left hepatic lobe, suspicious for liver metastasis. Consider percutaneous needle biopsy for tissue diagnosis.  Moderate right hydronephrosis and diffuse renal parenchymal atrophy. Abnormal heterogeneous signal intensity throughout the right renal collecting system and proximal right ureter, consistent with known urothelial carcinoma.  No other sites of abdominal metastatic disease identified.   Electronically Signed   By: Earle Gell M.D.   On: 11/19/2015 15:24    ASSESSMENT & PLAN:  Stage IV Urothelial carcinoma, multifocal R ureteral  masses Liver metastases Marginal PS CKD Goals of care discussion  We reviewed her prior discussion with Dr. Alen Blew. The patient and her family seem to understand that she is a candidate for immunotherapy given her comorbidities and CKD. Currently she is not sure she wishes to pursue any therapy.   We discussed immunotherapy in some detail today. She would like to think on this more She is doing well overall and is asymptomatic. She has a good quality of life currently.  . I had her meet Anderson Malta our navigator today, they will contact her if they wish to start therapy or have questions prior to her next scheduled follow-up.  Pathology and imaging reviewed. Results are noted above  Her prognosis was also discussed today in detail with her family. I feel that she has limited life expectancy likely in the months rather than the years. Palliative systemic therapy can improve that slightly but not dramatically.  The patient met with our patient navigator [place] today.  She will return for follow up tentatively in 3 weeks.  Code Status needs to be established.   ORDERS PLACED FOR THIS ENCOUNTER: No orders of the defined types were placed in this encounter.  Orders Placed This Encounter  Procedures  . CBC with Differential    Standing Status:   Future    Number of Occurrences:   1    Standing Expiration Date:   12/31/2016  . Comprehensive metabolic panel    Standing Status:   Future    Number of Occurrences:   1    Standing Expiration Date:   12/31/2016    All questions were answered. The patient knows to call the clinic with any problems, questions or concerns.  This document serves as a record of services personally performed by Ancil Linsey, MD. It was created on her behalf by Arlyce Harman, a trained medical scribe. The creation of this record is based on the scribe's personal observations and the provider's statements to them. This document has been checked and approved by  the attending provider.  I have reviewed the above documentation for accuracy and completeness and I agree with the above.  This note was electronically signed.  Molli Hazard, MD  01/01/2016 3:08 PM

## 2016-01-01 NOTE — Patient Instructions (Addendum)
Joann Hunter at [facility] Discharge Instructions  RECOMMENDATIONS MADE BY THE CONSULTANT AND ANY TEST RESULTS WILL BE SENT TO YOUR REFERRING PHYSICIAN.  You saw Dr.Penland today. Follow up in 3 weeks with labs. See Amy at checkout for appointments.  Thank you for choosing [place] at [facility] to provide your oncology and hematology care.  To afford each patient quality time with our provider, please arrive at least 15 minutes before your scheduled appointment time.    If you have a lab appointment with the [facility] please come in thru the  Main Entrance and check in at the main information desk  You need to re-schedule your appointment should you arrive 10 or more minutes late.  We strive to give you quality time with our providers, and arriving late affects you and other patients whose appointments are after yours.  Also, if you no show three or more times for appointments you may be dismissed from the clinic at the providers discretion.     Again, thank you for choosing [facility].  Our hope is that these requests will decrease the amount of time that you wait before being seen by our physicians.       _____________________________________________________________  Should you have questions after your visit to [facility], please contact our office at [phone] between the hours of 8:30 a.m. and 4:30 p.m.  Voicemails left after 4:30 p.m. will not be returned until the following business day.  For prescription refill requests, have your pharmacy contact our office.       Resources For Cancer Patients and their Caregivers ? American Cancer Society: Can assist with transportation, wigs, general needs, runs Look Good Feel Better.        916-319-4277 ? Cancer Care: Provides financial assistance, online support groups, medication/co-pay assistance.  1-800-813-HOPE 937-365-7707) ? [facility] Assists [place] cancer patients and their families through emotional , educational and financial support.  918-392-8251 ? [place] DSS Where to apply for food stamps, Medicaid and utility assistance. 406-593-7605 ? [facility]: Transportation to medical appointments. (508)098-5277 ? Social Security Administration: May apply for disability if have a Stage IV cancer. (513)326-8537 628-040-3282 ? [employer], Disability and Transit Services: Assists with nutrition, care and transit needs. [place] Support Programs: @10RELATIVEDAYS @ > Cancer Support Group  2nd Tuesday of the month 1pm-2pm, Journey Room  > Creative Journey  3rd Tuesday of the month 1130am-1pm, Journey Room  > Look Good Feel Better  1st Wednesday of the month 10am-12 noon, Journey Room (Call [place] to register [phone])

## 2016-01-27 ENCOUNTER — Encounter (HOSPITAL_COMMUNITY): Payer: Medicare Other | Attending: Hematology & Oncology | Admitting: Hematology & Oncology

## 2016-01-27 ENCOUNTER — Encounter (HOSPITAL_COMMUNITY): Payer: Self-pay | Admitting: Hematology & Oncology

## 2016-01-27 ENCOUNTER — Encounter (HOSPITAL_COMMUNITY): Payer: Medicare Other

## 2016-01-27 VITALS — BP 148/48 | HR 57 | Temp 97.6°F | Resp 14 | Wt 155.0 lb

## 2016-01-27 DIAGNOSIS — C689 Malignant neoplasm of urinary organ, unspecified: Secondary | ICD-10-CM

## 2016-01-27 DIAGNOSIS — Z7189 Other specified counseling: Secondary | ICD-10-CM

## 2016-01-27 DIAGNOSIS — N184 Chronic kidney disease, stage 4 (severe): Secondary | ICD-10-CM | POA: Diagnosis not present

## 2016-01-27 DIAGNOSIS — C787 Secondary malignant neoplasm of liver and intrahepatic bile duct: Secondary | ICD-10-CM

## 2016-01-27 LAB — CBC WITH DIFFERENTIAL/PLATELET
BASOS ABS: 0 10*3/uL (ref 0.0–0.1)
BASOS PCT: 1 %
EOS ABS: 0.1 10*3/uL (ref 0.0–0.7)
EOS PCT: 3 %
HCT: 34.9 % — ABNORMAL LOW (ref 36.0–46.0)
Hemoglobin: 11.7 g/dL — ABNORMAL LOW (ref 12.0–15.0)
LYMPHS ABS: 1 10*3/uL (ref 0.7–4.0)
Lymphocytes Relative: 24 %
MCH: 31.5 pg (ref 26.0–34.0)
MCHC: 33.5 g/dL (ref 30.0–36.0)
MCV: 93.8 fL (ref 78.0–100.0)
Monocytes Absolute: 0.5 10*3/uL (ref 0.1–1.0)
Monocytes Relative: 13 %
Neutro Abs: 2.6 10*3/uL (ref 1.7–7.7)
Neutrophils Relative %: 59 %
PLATELETS: 137 10*3/uL — AB (ref 150–400)
RBC: 3.72 MIL/uL — AB (ref 3.87–5.11)
RDW: 13 % (ref 11.5–15.5)
WBC: 4.3 10*3/uL (ref 4.0–10.5)

## 2016-01-27 LAB — COMPREHENSIVE METABOLIC PANEL
ALT: 9 U/L — AB (ref 14–54)
AST: 14 U/L — ABNORMAL LOW (ref 15–41)
Albumin: 3 g/dL — ABNORMAL LOW (ref 3.5–5.0)
Alkaline Phosphatase: 65 U/L (ref 38–126)
Anion gap: 7 (ref 5–15)
BUN: 27 mg/dL — ABNORMAL HIGH (ref 6–20)
CHLORIDE: 101 mmol/L (ref 101–111)
CO2: 27 mmol/L (ref 22–32)
CREATININE: 2.21 mg/dL — AB (ref 0.44–1.00)
Calcium: 8.8 mg/dL — ABNORMAL LOW (ref 8.9–10.3)
GFR calc Af Amer: 23 mL/min — ABNORMAL LOW (ref >60)
GFR calc non Af Amer: 20 mL/min — ABNORMAL LOW (ref >60)
Glucose, Bld: 238 mg/dL — ABNORMAL HIGH (ref 65–99)
Potassium: 4.3 mmol/L (ref 3.5–5.1)
SODIUM: 135 mmol/L (ref 135–145)
Total Bilirubin: 0.4 mg/dL (ref 0.3–1.2)
Total Protein: 6.3 g/dL — ABNORMAL LOW (ref 6.5–8.1)

## 2016-01-27 NOTE — Progress Notes (Signed)
Met with pt face to face.  Introduced myself and explain a little bit about my role as the patient navigator.  Pt given a card with all my information on it.  Told pt to call if they had any questions or concerns.  Pt verbalized understanding.    Pt coming in for chemo teaching next Wednesday 02/03/2016.  And starts chemo 02/04/2016.

## 2016-01-27 NOTE — Progress Notes (Signed)
PROGRESS NOTE  Joann Blitz, MD [address] [place] [address]   DIAGNOSIS:  Stage IV Urothelial Carcinoma  CURRENT THERAPY: Observation  INTERVAL HISTORY: Joann Hunter 83 y.o. female is here because of referral from Dr. Alen Hunter for ureteral cancer. The patient lives closer to [facility] and felt it would be more convenient for her and her family to follow-up in [place].  Per Dr. Hazeline Hunter note on 12/18/2015: "83 year old woman with history of hypertension, hyperlipidemia and coronary artery disease. She has been noted to have recurrent UTI and hematuria but subsequently underwent a cystoscopy and retrograde evaluation by Dr. Exie Hunter in October 2017. Retrograde evaluation showed multiple filling defects in the distal right ureter. There is marked irregularity of the proximal right ureter specialist for malignant process. She also had an ultrasound which showed hydronephrosis and her cystoscopy on 10/22/2015 showed a tumor noted out of the ureteral orifice. MRI of the abdomen obtained on 11/19/2015 showed a 3.2 cm mass of the left hepatic lobe suspicious for liver metastasis. Moderate right hydronephrosis was also noted. No other sites of abdominal metastasis noted. She underwent a biopsy of her liver lesion on 12/07/2015 which showed metastatic carcinoma consistent with transitional cell histology. She was referred to me for evaluation regarding these findings. Clinically, she is asymptomatic from this. She is no longer reporting any hematuria, dysuria or abdominal pain. She does not report any flank pain. Her performance status is limited related to arthritis. She ambulates short distances inside her house. She relies on her daughter for most activities of daily living."  Joann Hunter presents to the [facility] today accompanied by her daughters. She presents in a wheelchair. She is doing well today. Her daughters say she is doing about the same.   She would like to try the  immunotherapy. She has thought a lot about it over the holidays and notes that she has decided to at least try therapy.   She has been very tired. Her appetite is on and off. She denies frank hematuria. No new pain.  MEDICAL HISTORY: Past Medical History:  Diagnosis Date  . Arthritis   . Back pain, chronic   . CAD (coronary artery disease)    NSTEMI 04/2011 with newly diagnosed 3V CAD s/p PCI/DES mLAD 04/11/11 with residual dz (per Dr. Burt Knack, should have consideration of PCI of the occluded RCA based on symptoms and/or consideration of outpatient nuclear perfusion testing after the patient recovers from her infarct)  . Cancer (Joann Hunter)    kidney ( Nov.2017)  . Chronic kidney disease (CKD), stage IV (severe) ([place]) 12/13/2013  . Hyperglycemia   . Hyperlipidemia   . Hypertension   . Hypothyroidism   . Ischemic cardiomyopathy    EF 45% by cath 04/20/11, 50-55% by echo 04/22/11. hypotension limiting medication titration   . Myocardial infarction 2013  . Type 2 diabetes with nephropathy ([place]) 07/26/2014  . UTI (urinary tract infection) 12/13/2013    SURGICAL HISTORY: Past Surgical History:  Procedure Laterality Date  . BACK SURGERY    . BREAST LUMPECTOMY     right  . CORONARY STENT PLACEMENT    . LEFT HEART CATHETERIZATION WITH CORONARY ANGIOGRAM N/A 04/20/2011   Procedure: LEFT HEART CATHETERIZATION WITH CORONARY ANGIOGRAM;  Surgeon: Burnell Blanks, MD;  Location[facility] LAB;  Service: Cardiovascular;  Laterality: N/A;  . PERCUTANEOUS CORONARY STENT INTERVENTION (PCI-S) N/A 04/21/2011   Procedure: PERCUTANEOUS CORONARY STENT INTERVENTION (PCI-S);  Surgeon: Sherren Mocha, MD;  Location: [facility] CATH LAB;  Service: Cardiovascular;  Laterality: N/A;    SOCIAL HISTORY: Social History   Social History  . Marital status: Married    Spouse name: N/A  . Number of children: N/A  . Years of education: N/A   Occupational History  . Not on file.   Social History Main Topics  . Smoking  status: Never Smoker  . Smokeless tobacco: Never Used  . Alcohol use No  . Drug use: No  . Sexual activity: No     Comment: married 61 years-husband at [employer]   Other Topics Concern  . Not on file   Social History Narrative   Lives in [place], [place] with her husband and daughter.    FAMILY HISTORY: Family History  Problem Relation Age of Onset  . Other Mother   . Cancer Father   . Other      no known family cardiac disease  . Other Brother     Diptheria  . Depression Sister   . Pneumonia Sister   . Other Brother     murdered    Review of Systems  Constitutional: Positive for malaise/fatigue.       Appetite is on and off.   HENT: Negative.   Eyes: Negative.   Respiratory: Negative.   Cardiovascular: Negative.   Gastrointestinal: Negative.   Genitourinary: Negative.   Musculoskeletal: Negative.   Skin: Negative.   Neurological: Negative.   Endo/Heme/Allergies: Negative.   Psychiatric/Behavioral: Negative.   All other systems reviewed and are negative.  PHYSICAL EXAMINATION  ECOG PERFORMANCE STATUS: 1 - Symptomatic but completely ambulatory  Vitals:   01/27/16 1216  BP: (!) 148/48  Pulse: (!) 57  Resp: 14  Temp: 97.6 F (36.4 C)   Physical Exam  Constitutional: She is oriented to person, place, and time and well-developed, well-nourished, and in no distress.  In a wheelchair. Onto exam table with assistance  HENT:  Head: Normocephalic and atraumatic.  Mouth/Throat: No oropharyngeal exudate.  Eyes: EOM are normal. Pupils are equal, round, and reactive to light. No scleral icterus.  Neck: Normal range of motion. Neck supple.  Cardiovascular: Normal rate, regular rhythm and normal heart sounds.   Pulmonary/Chest: Effort normal and breath sounds normal.  Abdominal: Soft. Bowel sounds are normal. She exhibits no distension and no mass. There is no tenderness. There is no rebound and no guarding.  Musculoskeletal: Normal range of motion.  Lymphadenopathy:     She has no cervical adenopathy.  Neurological: She is alert and oriented to person, place, and time. No cranial nerve deficit.  Skin: Skin is warm and dry.  Psychiatric: Mood, memory, affect and judgment normal.  Nursing note and vitals reviewed.  LABORATORY DATA:  CBC    Component Value Date/Time   WBC 4.3 01/27/2016 1104   RBC 3.72 (L) 01/27/2016 1104   HGB 11.7 (L) 01/27/2016 1104   HCT 34.9 (L) 01/27/2016 1104   PLT 137 (L) 01/27/2016 1104   MCV 93.8 01/27/2016 1104   MCH 31.5 01/27/2016 1104   MCHC 33.5 01/27/2016 1104   RDW 13.0 01/27/2016 1104   LYMPHSABS 1.0 01/27/2016 1104   MONOABS 0.5 01/27/2016 1104   EOSABS 0.1 01/27/2016 1104   BASOSABS 0.0 01/27/2016 1104    CMP     Component Value Date/Time   NA 135 01/27/2016 1104   K 4.3 01/27/2016 1104   CL 101 01/27/2016 1104   CO2 27 01/27/2016 1104   GLUCOSE 238 (H) 01/27/2016 1104   BUN 27 (H) 01/27/2016 1104  CREATININE 2.21 (H) 01/27/2016 1104   CALCIUM 8.8 (L) 01/27/2016 1104   PROT 6.3 (L) 01/27/2016 1104   ALBUMIN 3.0 (L) 01/27/2016 1104   AST 14 (L) 01/27/2016 1104   ALT 9 (L) 01/27/2016 1104   ALKPHOS 65 01/27/2016 1104   BILITOT 0.4 01/27/2016 1104   GFRNONAA 20 (L) 01/27/2016 1104   GFRAA 23 (L) 01/27/2016 1104    RADIOGRAPHIC STUDIES: I have reviewed the images below and agree with the reported results  Study Result   INDICATION: History of malignant neoplasm of the ureter now with indeterminate liver lesion worrisome for metastatic disease. Please perform ultrasound-guided biopsy for tissue diagnostic purposes.  EXAM: ULTRASOUND GUIDED LIVER LESION BIOPSY  COMPARISON:  CT abdomen pelvis - 10/28/2015; abdominal MRI - 11/19/2015  MEDICATIONS: None  ANESTHESIA/SEDATION: Fentanyl 75 mcg IV; Versed 1.5 mg IV  Total Moderate Sedation time: 16 minutes.  The patient's level of consciousness and vital signs were monitored continuously by radiology nursing throughout the procedure  under my direct supervision.  COMPLICATIONS: None immediate.  PROCEDURE: Informed written consent was obtained from the patient after a discussion of the risks, benefits and alternatives to treatment. The patient understands and consents the procedure. A timeout was performed prior to the initiation of the procedure.  Ultrasound scanning was performed of the right upper abdominal quadrant demonstrates a ill-defined approximately 2.9 x 2.5 cm mixed echogenic solid lesion within the subcapsular aspect of the medial segment of the left lobe of the liver (image 4) which correlates with the lesions seen on preceding abdominal CT and MRI. Note this lesion was difficult to visualize with ultrasound secondary to location deep to several overlapping ribs a located near the dome of the liver. The procedure was planned.  The right upper abdominal quadrant was prepped and draped in the usual sterile fashion. The overlying soft tissues were anesthetized with 1% lidocaine with epinephrine. A 17 gauge, 6.8 cm co-axial needle was advanced into a peripheral aspect of the lesion. This was followed by 4 core biopsies with an 18 gauge core device under direct ultrasound guidance.  The coaxial needle tract was embolized with a small amount of Gel-Foam slurry and superficial hemostasis was obtained with manual compression. Post procedural scanning was negative for definitive area of hemorrhage or additional complication. A dressing was placed. The patient tolerated the procedure well without immediate post procedural complication.  IMPRESSION: Technically difficult though ultimately successful ultrasound guided core needle biopsy of indeterminate lesion within subcapsular aspect of the medial segment of left lobe of the liver.   Electronically Signed   By: Sandi Mariscal M.D.   On: 12/07/2015 15:20   Study Result   CLINICAL DATA:  Liver lesion seen on noncontrast CT. Right  ureteral carcinoma.  EXAM: MRI ABDOMEN WITHOUT CONTRAST  TECHNIQUE: Multiplanar multisequence MR imaging was performed without the administration of intravenous contrast.  COMPARISON:  Noncontrast CT on 10/28/2015 and 12/11/2013  FINDINGS: Lower chest: No acute findings.  Hepatobiliary: A solitary mass is seen in segment 4B of the left hepatic lobe which measures 2.3 x 3.2 cm on image 18/4. This shows T1 hypointensity and T2 hyperintensity. Characterization is limited by lack of intravenous contrast on this exam, however restricted diffusion is noted. This features are suspicious for liver metastasis. No other liver masses are identified.  Gallbladder is unremarkable. No evidence of biliary ductal dilatation.  Pancreas: No mass or inflammatory process visualized on this unenhanced exam.  Spleen:  Within normal limits in size.  Adrenals/Urinary tract:  No adrenal masses identified. Left kidney is normal in appearance. The right kidney shows diffuse parenchymal atrophy and moderate hydronephrosis. Abnormal heterogeneous signal intensity is seen within the renal collecting system and proximal right ureter, consistent with patient's history of urothelial carcinoma.  Stomach/Bowel: No evidence of obstruction, inflammatory process, or abnormal fluid collections.  Vascular/Lymphatic: No pathologically enlarged lymph nodes identified. No evidence of abdominal aortic aneurysm.  Other:  None.  Musculoskeletal:  No suspicious bone lesions identified.  IMPRESSION: 3.2 cm mass in segment 4B of the left hepatic lobe, suspicious for liver metastasis. Consider percutaneous needle biopsy for tissue diagnosis.  Moderate right hydronephrosis and diffuse renal parenchymal atrophy. Abnormal heterogeneous signal intensity throughout the right renal collecting system and proximal right ureter, consistent with known urothelial carcinoma.  No other sites of abdominal  metastatic disease identified.   Electronically Signed   By: Earle Gell M.D.   On: 11/19/2015 15:24   PATHOLOGY:   ASSESSMENT and THERAPY PLAN:  Stage IV Urothelial carcinoma, multifocal R ureteral masses Liver metastases Marginal PS CKD Goals of care discussion   We again reviewed her goals of care. She understands that she has incurable disease. She wishes to try immunotherapy to "slow" her disease process. We did address some of the side effects of immunotherapy today including but not limited to pneumonitis, colitis. She will require formal immunotherapy teaching.   Start immunotherapy class, then immunotherapy. I answered all of her and her daughters' questions.   Re-image before she starts immunotherapy.   She will return for a follow up after her scans and prior to D1 of treatment.  Orders Placed This Encounter  Procedures  . MR LIVER WO CONRTAST    Standing Status:   Future    Standing Expiration Date:   03/26/2017    Order Specific Question:   Reason for Exam (SYMPTOM  OR DIAGNOSIS REQUIRED)    Answer:   metastatic urothelial carcinoma    Order Specific Question:   Preferred imaging location?    Answer:   [facility] (table limit-350lbs)    Order Specific Question:   What is the patient's sedation requirement?    Answer:   No Sedation    Order Specific Question:   Does the patient have a pacemaker or implanted devices?    Answer:   No    All questions were answered. The patient knows to call the clinic with any problems, questions or concerns. We can certainly see the patient much sooner if necessary.  This document serves as a record of services personally performed by Ancil Linsey, MD. It was created on her behalf by Martinique Casey, a trained medical scribe. The creation of this record is based on the scribe's personal observations and the provider's statements to them. This document has been checked and approved by the attending provider.  I have  reviewed the above documentation for accuracy and completeness and I agree with the above.  This note was electronically signed.Molli Hazard, MD  01/27/2016

## 2016-01-27 NOTE — Patient Instructions (Signed)
[place] at [facility] Discharge Instructions  RECOMMENDATIONS MADE BY THE CONSULTANT AND ANY TEST RESULTS WILL BE SENT TO YOUR REFERRING PHYSICIAN.  You were seen today by Dr. Cecilio Asper, RN is our patient navigator.  She will do chemo teaching for the immunotherapy. Follow up one week post treatment.  Thank you for choosing [place] at [facility] to provide your oncology and hematology care.  To afford each patient quality time with our provider, please arrive at least 15 minutes before your scheduled appointment time.    If you have a lab appointment with the [facility] please come in thru the  Main Entrance and check in at the main information desk  You need to re-schedule your appointment should you arrive 10 or more minutes late.  We strive to give you quality time with our providers, and arriving late affects you and other patients whose appointments are after yours.  Also, if you no show three or more times for appointments you may be dismissed from the clinic at the providers discretion.     Again, thank you for choosing [facility].  Our hope is that these requests will decrease the amount of time that you wait before being seen by our physicians.       _____________________________________________________________  Should you have questions after your visit to [facility], please contact our office at [phone] between the hours of 8:30 a.m. and 4:30 p.m.  Voicemails left after 4:30 p.m. will not be returned until the following business day.  For prescription refill requests, have your pharmacy contact our office.       Resources For Cancer Patients and their Caregivers ? American Cancer Society: Can assist with transportation, wigs, general needs, runs Look Good Feel Better.        (919) 062-9789 ? Cancer Care: Provides financial assistance, online support groups,  medication/co-pay assistance.  1-800-813-HOPE (443)478-3195) ? [place] Assists [place] cancer patients and their families through emotional , educational and financial support.  518-531-6953 ? [place] DSS Where to apply for food stamps, Medicaid and utility assistance. (320) 088-1942 ? [facility]: Transportation to medical appointments. (360)255-7009 ? Social Security Administration: May apply for disability if have a Stage IV cancer. 223-494-2091 (431) 301-6969 ? [employer], Disability and Transit Services: Assists with nutrition, care and transit needs. [place] Support Programs: @10RELATIVEDAYS @ > Cancer Support Group  2nd Tuesday of the month 1pm-2pm, Journey Room  > Creative Journey  3rd Tuesday of the month 1130am-1pm, Journey Room  > Look Good Feel Better  1st Wednesday of the month 10am-12 noon, Journey Room (Call [facility] to register [phone])

## 2016-01-28 ENCOUNTER — Other Ambulatory Visit (HOSPITAL_COMMUNITY): Payer: Self-pay | Admitting: Pharmacist

## 2016-01-29 MED ORDER — PROCHLORPERAZINE MALEATE 10 MG PO TABS: 10.0000 mg | ORAL_TABLET | Freq: Four times a day (QID) | ORAL | 2 refills | Status: DC | PRN

## 2016-01-29 MED ORDER — ONDANSETRON HCL 8 MG PO TABS: 8.0000 mg | ORAL_TABLET | Freq: Three times a day (TID) | ORAL | 2 refills | Status: DC | PRN

## 2016-01-29 NOTE — Patient Instructions (Addendum)
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  You are going to be treated with tecentriq.  This is given every 3 weeks.  It takes 30 minutes to infuse.  This treatment is with palliative intent, which means that you are treatable but not curable.   You will see the doctor regularly throughout treatment.  We monitor your lab work prior to every treatment.  The doctor monitors your response to treatment by the way you are feeling, your blood work, and scans periodically.   POTENTIAL SIDE EFFECTS OF TREATMENT:  Atezolizumab Gildardo Pounds)  About This Drug Huey Bienenstock is used to treat cancer. It is given by the vein (IV).  Possible Side Effects . Tiredness . Decreased appetite (decreased hunger) . Nausea . Constipation (not able to move bowels) . Loose bowel movements (diarrhea) . Urinary tract infection. Symptoms may include: . Pain or burning when you pass urine. . Feeling like you have to pass urine often, but not much comes out when you do. . Tender or heavy feeling in your lower abdomen. . Cloudy urine and/or urine that smells bad. . Pain on one side of your back under your ribs. This is where your kidneys are. . Fever, chills, nausea and/or throwing up. . Fever . Cough and/or trouble breathing . Muscle, bone and joint pain Note: Each of the side effects above was reported in 20% or greater of patients treated with atezolizumab. Not all possible side effects are included above.  Warnings and Precautions . This drug works with your immune system and can cause inflammation in any of your organs and tissues and can change how they work. This may put you at risk for developing serious medical problems which can very rarely be fatal. . Inflammation (swelling) of the lungs which can very rarely be fatal. You may have a dry cough or trouble breathing. . Changes in your liver function. . Colitis. This is swelling  (inflammation) in the colon - symptoms are loose bowel movements (diarrhea) stomach cramping, and sometimes blood in the bowel movements . Changes in your central nervous system can happen. The central nervous system is made up of your brain and spinal cord. You could feel extreme tiredness, agitation, confusion, hallucinations (see or hear things that are not there), trouble understanding or speaking, loss of control of your bowels or bladder, eyesight changes, numbness or lack of strength to your arms, legs, face, or body, and coma. If you start to have any of these symptoms let your doctor know right away. . This drug may affect some of your hormone glands (especially the thyroid, adrenals, pituitary and pancreas). . Blood sugar levels may change and you may develop diabetes. If you already have diabetes, changes may need to be made to your diabetes medication. . Inflammation of your pancreas. . Inflammation of your eyes and/or other changes in eyesight . Severe infections, including viral, bacterial and fungal, which can very rarely be fatal . While you are getting this drug in your vein (IV), you may have a reaction to the drug. Sometimes you may be given medication to stop or lessen these side effects. Your nurse will check you closely for these signs: fever or shaking chills, flushing, facial swelling, feeling dizzy, headache, trouble breathing, rash, itching, chest tightness, or chest pain. These reactions may happen after your infusion. If this happens, call 911 for emergency care.  Important Information . This drug may be present in the  saliva, tears, sweat, urine, stool, vomit, semen, and vaginal secretions. Talk to your doctor and/or your nurse about the necessary precautions to take during this time.  Treating Side Effects . Drink plenty of fluids (a minimum of eight glasses per day is recommended). . To help with decreased appetite, eat small, frequent meals . Eat high caloric food such  as pudding, ice cream, yogurt and milkshakes. . Ask your doctor or nurse about medicine that is available to help stop or lessen the loose bowel movements, nausea and/or constipation. . If you are not able to move your bowels, check with your doctor or nurse before you use enemas, laxatives, or suppositories . If you throw up or have loose bowel movements, you should drink more fluids so that you do not become dehydrated (lack water in the body from losing too much fluid). . To help with nausea and vomiting, eat small, frequent meals instead of three large meals a day. Choose foods and drinks that are at room temperature. Ask your nurse or doctor about other helpful tips and medicine that is available to help or stop lessen these symptoms. . If you get diarrhea, eat low-fiber foods that are high in protein and calories and avoid foods that can irritate your digestive tracts or lead to cramping. . Manage tiredness by pacing your activities for the day. Be sure to include periods of rest between energy-draining activities . If you're diabetic, keep good control of your blood sugar level. Tell your nurse or your doctor if your glucose levels are higher or lower than normal . Keeping your pain under control is important to your well-being. Please tell your doctor or nurse if you are experiencing pain. . Infusion reactions may happen for 24 hours after your infusion. If this happens, call 911 for emergency care.  Food and Drug Interactions . There are no known interactions of atezolizumab with food. . This drug may interact with other medicines. Tell your doctor and pharmacist about all the medicines and dietary supplements (vitamins, minerals, herbs and others) that you are taking at this time. The safety and use of dietary supplements and alternative diets are often not known. Using these might affect your cancer or interfere with your treatment. Until more is known, you should not use dietary supplements  or alternative diets without your cancer doctor's help.  When to Call the Doctor Call your doctor or nurse if you have any of these symptoms and/or any new or unusual symptoms: . Fever of 100.5 F (38 C) or higher . Chills . Pain in your chest . Dry cough . Trouble breathing . Confusion and/or agitation . Hallucinations . Trouble understanding or speaking . Blurry vision or changes in your eyesight . Numbness or lack of strength to your arms, legs, face, or body . Blurred vision or other changes in eyesight . Loose bowel movements (diarrhea) 4 times or loose bowel movements with lack of strength or a feeling of being dizzy . Pain in your abdomen that does not go away . Blood in your stool . No bowel movement in 3 days or when you feel uncomfortable . Nausea that stops you from eating or drinking and/or is not relieved by prescribed medicines . Throwing up more than 3 times a day . Lasting loss of appetite or rapid weight loss of five pounds in a week . Pain or burning when you pass urine. . Difficulty urinating . Feeling like you have to pass urine often, but not much comes  out when you do. . Tender or heavy feeling in your lower abdomen. . Cloudy urine and/or urine that smells bad. . Pain on one side of your back under your ribs. This is where your kidneys are. . Abnormal blood sugar . Unusual thirst, passing urine often, headache, sweating, shakiness, irritability . Pain that does not go away, or is not relieved by prescribed medicines . Fatigue that interferes with your daily activities . Signs of infusion reaction: fever or shaking chills, flushing, facial swelling, feeling dizzy, headache, trouble breathing, rash, itching, chest tightness, or chest pain. . Signs of possible liver problems: dark urine, pale bowel movements, bad stomach pain, feeling very tired and weak, unusual itching, or yellowing of the eyes or skin . If you think you may be pregnant  Reproduction  Warnings . Pregnancy warning: This drug can have harmful effects on the unborn baby. Women of child bearing potential should use effective methods of birth control during your cancer treatment and for at least 5 months after treatment. Let your doctor know right away if you think you may be pregnant. . Breastfeeding warning: It is not known if this drug passes into breast milk. For this reason, Women should not breast feed during treatment and for at least 5 months after treatment because this drug could enter the breast milk and cause harm to a breast feeding baby. . Fertility warning: In women, this drug may affect your ability to have children in the future. Talk with your doctor or nurse if you plan to have children. Ask for information on egg banking.    SELF CARE ACTIVITIES WHILE ON CHEMOTHERAPY: Hydration Increase your fluid intake 48 hours prior to treatment and drink at least 8 to 12 cups (64 ounces) of water/decaff beverages per day after treatment. You can still have your cup of coffee or soda but these beverages do not count as part of your 8 to 12 cups that you need to drink daily. No alcohol intake.  Medications Continue taking your normal prescription medication as prescribed.  If you start any new herbal or new supplements please let us know first to make sure it is safe.  Mouth Care Have teeth cleaned professionally before starting treatment. Keep dentures and partial plates clean. Use soft toothbrush and do not use mouthwashes that contain alcohol. Biotene is a good mouthwash that is available at most pharmacies or may be ordered by calling [phone]. Use warm salt water gargles (1 teaspoon salt per 1 quart warm water) before and after meals and at bedtime. Or you may rinse with 2 tablespoons of three-percent hydrogen peroxide mixed in eight ounces of water. If you are still having problems with your mouth or sores in your mouth please call the clinic. If you need dental work,  please let Dr. Whitney Muse know before you go for your appointment so that we can coordinate the best possible time for you in regards to your chemo regimen. You need to also let your dentist know that you are actively taking chemo. We may need to do labs prior to your dental appointment.   Skin Care Always use sunscreen that has not expired and with SPF (Sun Protection Factor) of 50 or higher. Wear hats to protect your head from the sun. Remember to use sunscreen on your hands, ears, face, & feet.  Use good moisturizing lotions such as udder cream, eucerin, or even Vaseline. Some chemotherapies can cause dry skin, color changes in your skin and nails.    Marland Kitchen  Avoid long, hot showers or baths. . Use gentle, fragrance-free soaps and laundry detergent. . Use moisturizers, preferably creams or ointments rather than lotions because the thicker consistency is better at preventing skin dehydration. Apply the cream or ointment within 15 minutes of showering. Reapply moisturizer at night, and moisturize your hands every time after you wash them.  Hair Loss (if your doctor says your hair will fall out)  . If your doctor says that your hair is likely to fall out, decide before you begin chemo whether you want to wear a wig. You may want to shop before treatment to match your hair color. . Hats, turbans, and scarves can also camouflage hair loss, although some people prefer to leave their heads uncovered. If you go bare-headed outdoors, be sure to use sunscreen on your scalp. . Cut your hair short. It eases the inconvenience of shedding lots of hair, but it also can reduce the emotional impact of watching your hair fall out. . Don't perm or color your hair during chemotherapy. Those chemical treatments are already damaging to hair and can enhance hair loss. Once your chemo treatments are done and your hair has grown back, it's OK to resume dyeing or perming hair. With chemotherapy, hair loss is almost always  temporary. But when it grows back, it may be a different color or texture. In older adults who still had hair color before chemotherapy, the new growth may be completely gray.  Often, new hair is very fine and soft.  Infection Prevention Please wash your hands for at least 30 seconds using warm soapy water. Handwashing is the #1 way to prevent the spread of germs. Stay away from sick people or people who are getting over a cold. If you develop respiratory systems such as green/yellow mucus production or productive cough or persistent cough let us know and we will see if you need an antibiotic. It is a good idea to keep a pair of gloves on when going into grocery stores/[employer] to decrease your risk of coming into contact with germs on the carts, etc. Carry alcohol hand gel with you at all times and use it frequently if out in public. If your temperature reaches 100.5 or higher please call the clinic and let us know.  If it is after hours or on the weekend please go to the ER if your temperature is over 100.5.  Please have your own personal thermometer at home to use.    Sex and bodily fluids If you are going to have sex, a condom must be used to protect the person that isn't taking chemotherapy. Chemo can decrease your libido (sex drive). For a few days after chemotherapy, chemotherapy can be excreted through your bodily fluids.  When using the toilet please close the lid and flush the toilet twice.  Do this for a few day after you have had chemotherapy.     Effects of chemotherapy on your sex life Some changes are simple and won't last long. They won't affect your sex life permanently. Sometimes you may feel: . too tired . not strong enough to be very active . sick or sore  . not in the mood . anxious or low Your anxiety might not seem related to sex. For example, you may be worried about the cancer and how your treatment is going. Or you may be worried about money, or about how you family are  coping with your illness. These things can cause stress, which can affect your  interest in sex. It's important to talk to your partner about how you feel. Remember - the changes to your sex life don't usually last long. There's usually no medical reason to stop having sex during chemo. The drugs won't have any long term physical effects on your performance or enjoyment of sex. Cancer can't be passed on to your partner during sex  Contraception It's important to use reliable contraception during treatment. Avoid getting pregnant while you or your partner are having chemotherapy. This is because the drugs may harm the baby. Sometimes chemotherapy drugs can leave a man or woman infertile.  This means you would not be able to have children in the future. You might want to talk to someone about permanent infertility. It can be very difficult to learn that you may no longer be able to have children. Some people find counselling helpful. There might be ways to preserve your fertility, although this is easier for men than for women. You may want to speak to a fertility expert. You can talk about sperm banking or harvesting your eggs. You can also ask about other fertility options, such as donor eggs. If you have or have had breast cancer, your doctor might advise you not to take the contraceptive pill. This is because the hormones in it might affect the cancer.  It is not known for sure whether or not chemotherapy drugs can be passed on through semen or secretions from the vagina. Because of this some doctors advise people to use a barrier method if you have sex during treatment. This applies to vaginal, anal or oral sex. Generally, doctors advise a barrier method only for the time you are actually having the treatment and for about a week after your treatment. Advice like this can be worrying, but this does not mean that you have to avoid being intimate with your partner. You can still have close contact with  your partner and continue to enjoy sex.  Animals If you have cats or birds we just ask that you not change the litter or change the cage.  Please have someone else do this for you while you are on chemotherapy.   Food Safety During and After Cancer Treatment Food safety is important for people both during and after cancer treatment. Cancer and cancer treatments, such as chemotherapy, radiation therapy, and stem cell/bone marrow transplantation, often weaken the immune system. This makes it harder for your body to protect itself from foodborne illness, also called food poisoning. Foodborne illness is caused by eating food that contains harmful bacteria, parasites, or viruses.  Foods to avoid Some foods have a higher risk of becoming tainted with bacteria. These include: Marland Kitchen Unwashed fresh fruit and vegetables, especially leafy vegetables that can hide dirt and other contaminants . Raw sprouts, such as alfalfa sprouts . Raw or undercooked beef, especially ground beef, or other raw or undercooked meat and poultry . Fatty, fried, or spicy foods immediately before or after treatment.  These can sit heavy on your stomach and make you feel nauseous. . Raw or undercooked shellfish, such as oysters. . Sushi and sashimi, which often contain raw fish.  . Unpasteurized beverages, such as unpasteurized fruit juices, raw milk, raw yogurt, or cider . Undercooked eggs, such as soft boiled, over easy, and poached; raw, unpasteurized eggs; or foods made with raw egg, such as homemade raw cookie dough and homemade mayonnaise Simple steps for food safety Shop smart. . Do not buy food stored or displayed in an  unclean area. . Do not buy bruised or damaged fruits or vegetables. . Do not buy cans that have cracks, dents, or bulges. . Pick up foods that can spoil at the end of your shopping trip and store them in a cooler on the way home. Prepare and clean up foods carefully. . Rinse all fresh fruits and vegetables  under running water, and dry them with a clean towel or paper towel. . Clean the top of cans before opening them. . After preparing food, wash your hands for 20 seconds with hot water and soap. Pay special attention to areas between fingers and under nails. . Clean your utensils and dishes with hot water and soap. Marland Kitchen Disinfect your kitchen and cutting boards using 1 teaspoon of liquid, unscented bleach mixed into 1 quart of water.   Dispose of old food. . Eat canned and packaged food before its expiration date (the "use by" or "best before" date). . Consume refrigerated leftovers within 3 to 4 days. After that time, throw out the food. Even if the food does not smell or look spoiled, it still may be unsafe. Some bacteria, such as Listeria, can grow even on foods stored in the refrigerator if they are kept for too long. Take precautions when eating out. . At restaurants, avoid buffets and salad bars where food sits out for a long time and comes in contact with many people. Food can become contaminated when someone with a virus, often a norovirus, or another "bug" handles it. . Put any leftover food in a "to-go" container yourself, rather than having the server do it. And, refrigerate leftovers as soon as you get home. . Choose restaurants that are clean and that are willing to prepare your food as you order it cooked.    MEDICATIONS:                                                                                                                                                              Zofran/Ondansetron 8mg  tablet. Take 1 tablet every 8 hours as needed for nausea/vomiting. (#1 nausea med to take, this can constipate)  Compazine/Prochlorperazine 10mg  tablet. Take 1 tablet every 6 hours as needed for nausea/vomiting. (#2 nausea med to take, this can make you sleepy)   EMLA cream. Apply a quarter size amount to port site 1 hour prior to chemo. Do not rub in. Cover with plastic  wrap.   Over-the-Counter Meds:  Miralax 1 capful in 8 oz of fluid daily. May increase to two times a day if needed. This is a stool softener. If this doesn't work proceed you can add:  Senokot S-start with 1 tablet two times a day and increase to 4 tablets two times a day if needed. (total of 8 tablets in a 24  hour period). This is a stimulant laxative.   Call us if this does not help your bowels move.   Imodium 2mg  capsule. Take 2 capsules after the 1st loose stool and then 1 capsule every 2 hours until you go a total of 12 hours without having a loose stool. Call the [facility] if loose stools continue. If diarrhea occurs @ bedtime, take 2 capsules @ bedtime. Then take 2 capsules every 4 hours until morning. Call [facility].     Diarrhea Sheet  If you are having loose stools/diarrhea, please purchase Imodium and begin taking as outlined:  At the first sign of poorly formed or loose stools you should begin taking Imodium(loperamide) 2 mg capsules.  Take two caplets (4mg ) followed by one caplet (2mg ) every 2 hours until you have had no diarrhea for 12 hours.  During the night take two caplets (4mg ) at bedtime and continue every 4 hours during the night until the morning.  Stop taking Imodium only after there is no sign of diarrhea for 12 hours.    Always call the [facility] if you are having loose stools/diarrhea that you can't get under control.  Loose stools/disrrhea leads to dehydration (loss of water) in your body.  We have other options of trying to get the loose stools/diarrhea to stopped but you must let us know!     Constipation Sheet *Miralax in 8 oz of fluid daily.  May increase to two times a day if needed.  This is a stool softener.  If this not enough to keep your bowel regular:  You can add:  *Senokot S, start with one tablet twice a day and can increase to 4 tablets twice a day if needed.  This is a stimulant laxative.   Sometimes when you take pain medication  you need BOTH a medicine to keep your stool soft and a medicine to help your bowel push it out!  Please call if the above does not work for you.   Do not go more than 2 days without a bowel movement.  It is very important that you do not become constipated.  It will make you feel sick to your stomach (nausea) and can cause abdominal pain and vomiting.     Nausea Sheet  Zofran/Ondansetron 8mg  tablet. Take 1 tablet every 8 hours as needed for nausea/vomiting. (#1 nausea med to take, this can constipate)  Compazine/Prochlorperazine 10mg  tablet. Take 1 tablet every 6 hours as needed for nausea/vomiting. (#2 nausea med to take, this can make you sleepy)  You can take these medications together or separately.  We would first like for you to try the Ondansetron by itself and then take the Prochloperizine if needed. But you are allowed to take both medications at the same time if your nausea is that severe.  If you are having persistent nausea (nausea that does not stop) please take these medications on a staggered schedule so that the nausea medication stays in your body.  Please call the [facility] and let us know the amount of nausea that you are experiencing.  If you begin to vomit, you need to call the [facility] and if it is the weekend and you have vomited more than one time and cant get it to stop-go to the Emergency Room.  Persistent nausea/vomiting can lead to dehydration (loss of fluid in your body) and will make you feel terrible.   Ice chips, sips of clear liquids, foods that are @ room temperature,  crackers, and toast tend to be better tolerated.     SYMPTOMS TO REPORT AS SOON AS POSSIBLE AFTER TREATMENT:  FEVER GREATER THAN 100.5 F  CHILLS WITH OR WITHOUT FEVER  NAUSEA AND VOMITING THAT IS NOT CONTROLLED WITH YOUR NAUSEA MEDICATION  UNUSUAL SHORTNESS OF BREATH  UNUSUAL BRUISING OR BLEEDING  TENDERNESS IN MOUTH AND THROAT WITH OR WITHOUT PRESENCE OF ULCERS  URINARY  PROBLEMS  BOWEL PROBLEMS  UNUSUAL RASH    Wear comfortable clothing and clothing appropriate for easy access to any Portacath or PICC line. Let us know if there is anything that we can do to make your therapy better!    What to do if you need assistance after hours or on the weekends: CALL [phone].  HOLD on the line, do not hang up.  You will hear multiple messages but at the end you will be connected with a nurse triage line.  They will contact Dr Whitney Muse if necessary.  Most of the time they will be able to assist you.   Do not call the hospital operator.  Dr Whitney Muse will not answer phone calls received by them.     I have been informed and understand all of the instructions given to me and have received a copy. I have been instructed to call the clinic [phone] or my family physician as soon as possible for continued medical care, if indicated. I do not have any more questions at this time but understand that I may call the [facility] or the Patient Navigator at [phone] during office hours should I have questions or need assistance in obtaining follow-up care.

## 2016-02-02 ENCOUNTER — Encounter (HOSPITAL_COMMUNITY): Payer: Self-pay | Admitting: Hematology & Oncology

## 2016-02-02 DIAGNOSIS — Z7189 Other specified counseling: Secondary | ICD-10-CM | POA: Insufficient documentation

## 2016-02-02 NOTE — Progress Notes (Signed)
START ON PATHWAY REGIMEN - Bladder  BLAOS70: Atezolizumab 1200 mg q21 Days Until Progression or Toxicity   A cycle is every 21 days:     Atezolizumab (Tecentriq(R)) 1200 mg flat dose in 250 mL NS IV over 60 minutes day 1. If first infusion is tolerated, may give subsequent doses over 30 minutes. Dose Mod: None Additional Orders: Severe immune-mediated reactions can occur (e.g. pneumonitis, colitis, and hepatitis).  See prescribing information for more details including monitoring and required immediate management with steroids. Monitor thyroid and liver  function tests at baseline and periodically during therapy.  References: Danise Mina al. Elmore Guise. 2016 May 7;387(10031):1909-20 Tecentriq(TM) Huey Bienenstock) prescribing information, 05/2014  **Always confirm dose/schedule in your pharmacy ordering system**    Patient Characteristics: Metastatic Disease, First Line, No Prior Neoadjuvant/Adjuvant Therapy AJCC Stage Grouping: IV Current evidence of distant metastases? Yes AJCC T Stage: X AJCC M Stage: X AJCC N Stage: X Line of therapy: First Line Would you be surprised if this patient died  in the next year? I would NOT be surprised if this patient died in the next year Prior Neoadjuvant/Adjuvant Therapy? No  Intent of Therapy: Non-Curative / Palliative Intent, Discussed with Patient

## 2016-02-03 ENCOUNTER — Encounter (HOSPITAL_COMMUNITY): Payer: Medicare Other

## 2016-02-03 DIAGNOSIS — C689 Malignant neoplasm of urinary organ, unspecified: Secondary | ICD-10-CM

## 2016-02-03 NOTE — Progress Notes (Signed)
Consent signed for tecentriq.

## 2016-02-04 ENCOUNTER — Encounter (HOSPITAL_COMMUNITY): Payer: Medicare Other | Attending: Hematology & Oncology

## 2016-02-04 ENCOUNTER — Encounter (HOSPITAL_COMMUNITY): Payer: Self-pay

## 2016-02-04 VITALS — BP 136/45 | HR 56 | Temp 98.2°F | Resp 16

## 2016-02-04 DIAGNOSIS — Z5112 Encounter for antineoplastic immunotherapy: Secondary | ICD-10-CM

## 2016-02-04 DIAGNOSIS — C689 Malignant neoplasm of urinary organ, unspecified: Secondary | ICD-10-CM | POA: Insufficient documentation

## 2016-02-04 DIAGNOSIS — C787 Secondary malignant neoplasm of liver and intrahepatic bile duct: Secondary | ICD-10-CM | POA: Diagnosis not present

## 2016-02-04 MED ORDER — SODIUM CHLORIDE 0.9 % IV SOLN
Freq: Once | INTRAVENOUS | Status: AC
  Administered 2016-02-04: 12:00:00 via INTRAVENOUS

## 2016-02-04 MED ORDER — SODIUM CHLORIDE 0.9 % IV SOLN
1200.0000 mg | Freq: Once | INTRAVENOUS | Status: AC
  Administered 2016-02-04: 1200 mg via INTRAVENOUS
  Filled 2016-02-04: qty 20

## 2016-02-04 MED ORDER — HEPARIN SOD (PORK) LOCK FLUSH 100 UNIT/ML IV SOLN: 500.0000 [IU] | Freq: Once | INTRAVENOUS | Status: DC | PRN

## 2016-02-04 MED ORDER — SODIUM CHLORIDE 0.9% FLUSH: 10.0000 mL | INTRAVENOUS | Status: DC | PRN

## 2016-02-04 NOTE — Progress Notes (Signed)
Patient tolerated infusion well.  Patient given education on side effects and how to manage those at home and when to call if she was unable to control.  Importance of hydration and to manage diarrhea if she experiences it was discussed with patient and family member.  Patient and family member verbalized understanding.  VSS.  Patient stable and wheeled out via wheelchair per patient preference.

## 2016-02-04 NOTE — Patient Instructions (Signed)
[facility] Discharge Instructions for Patients Receiving Chemotherapy   Beginning January 23rd 2017 lab work for the [facility] will be done in the  Main lab at [facility] on 1st floor. If you have a lab appointment with the [facility] please come in thru the  Main Entrance and check in at the main information desk   Today you received the following chemotherapy agent:    If you develop nausea and vomiting, or diarrhea that is not controlled by your medication, call the clinic.  The clinic phone number is [phone]. Office hours are Monday-Friday 8:30am-5:00pm.  BELOW ARE SYMPTOMS THAT SHOULD BE REPORTED IMMEDIATELY:  *FEVER GREATER THAN 101.0 F  *CHILLS WITH OR WITHOUT FEVER  NAUSEA AND VOMITING THAT IS NOT CONTROLLED WITH YOUR NAUSEA MEDICATION  *UNUSUAL SHORTNESS OF BREATH  *UNUSUAL BRUISING OR BLEEDING  TENDERNESS IN MOUTH AND THROAT WITH OR WITHOUT PRESENCE OF ULCERS  *URINARY PROBLEMS  *BOWEL PROBLEMS  UNUSUAL RASH Items with * indicate a potential emergency and should be followed up as soon as possible. If you have an emergency after office hours please contact your primary care physician or go to the nearest emergency department.  Please call the clinic during office hours if you have any questions or concerns.   You may also contact the Patient Navigator at [phone] should you have any questions or need assistance in obtaining follow up care.      Resources For Cancer Patients and their Caregivers ? American Cancer Society: Can assist with transportation, wigs, general needs, runs Look Good Feel Better.        ([phone] [phone] ? Cancer Care: Provides financial assistance, online support groups, medication/co-pay assistance.  1-800-813-HOPE 938 497 9218) ? [place] Assists [place] cancer patients and their families through emotional , educational and financial support.  715-789-1738 ? [place]  DSS Where to apply for food stamps, Medicaid and utility assistance. 8198405150 ? [employer]: Transportation to medical appointments. 712-674-3442 ? Social Security Administration: May apply for disability if have a Stage IV cancer. 3645481769 [phone] ? [employer], Disability and Transit Services: Assists with nutrition, care and transit needs. [phone]

## 2016-02-12 ENCOUNTER — Encounter (HOSPITAL_COMMUNITY): Payer: Medicare Other | Attending: Adult Health | Admitting: Adult Health

## 2016-02-12 ENCOUNTER — Other Ambulatory Visit (HOSPITAL_COMMUNITY): Payer: Medicare Other

## 2016-02-12 ENCOUNTER — Encounter (HOSPITAL_COMMUNITY): Payer: Medicare Other

## 2016-02-12 ENCOUNTER — Encounter (HOSPITAL_COMMUNITY): Payer: Self-pay | Admitting: Adult Health

## 2016-02-12 VITALS — BP 122/45 | HR 52 | Temp 97.8°F | Resp 16 | Wt 155.0 lb

## 2016-02-12 DIAGNOSIS — C689 Malignant neoplasm of urinary organ, unspecified: Secondary | ICD-10-CM

## 2016-02-12 DIAGNOSIS — E785 Hyperlipidemia, unspecified: Secondary | ICD-10-CM | POA: Diagnosis not present

## 2016-02-12 DIAGNOSIS — C787 Secondary malignant neoplasm of liver and intrahepatic bile duct: Secondary | ICD-10-CM

## 2016-02-12 DIAGNOSIS — E039 Hypothyroidism, unspecified: Secondary | ICD-10-CM

## 2016-02-12 DIAGNOSIS — I10 Essential (primary) hypertension: Secondary | ICD-10-CM

## 2016-02-12 DIAGNOSIS — N184 Chronic kidney disease, stage 4 (severe): Secondary | ICD-10-CM

## 2016-02-12 LAB — CBC WITH DIFFERENTIAL/PLATELET
BASOS ABS: 0 10*3/uL (ref 0.0–0.1)
BASOS PCT: 1 %
EOS ABS: 0.1 10*3/uL (ref 0.0–0.7)
Eosinophils Relative: 3 %
HCT: 35.1 % — ABNORMAL LOW (ref 36.0–46.0)
Hemoglobin: 11.9 g/dL — ABNORMAL LOW (ref 12.0–15.0)
Lymphocytes Relative: 21 %
Lymphs Abs: 0.8 10*3/uL (ref 0.7–4.0)
MCH: 31.3 pg (ref 26.0–34.0)
MCHC: 33.9 g/dL (ref 30.0–36.0)
MCV: 92.4 fL (ref 78.0–100.0)
MONO ABS: 0.4 10*3/uL (ref 0.1–1.0)
MONOS PCT: 12 %
Neutro Abs: 2.3 10*3/uL (ref 1.7–7.7)
Neutrophils Relative %: 63 %
PLATELETS: 163 10*3/uL (ref 150–400)
RBC: 3.8 MIL/uL — ABNORMAL LOW (ref 3.87–5.11)
RDW: 13.4 % (ref 11.5–15.5)
WBC: 3.6 10*3/uL — ABNORMAL LOW (ref 4.0–10.5)

## 2016-02-12 LAB — COMPREHENSIVE METABOLIC PANEL
ALBUMIN: 3.2 g/dL — AB (ref 3.5–5.0)
ALT: 9 U/L — ABNORMAL LOW (ref 14–54)
ANION GAP: 7 (ref 5–15)
AST: 17 U/L (ref 15–41)
Alkaline Phosphatase: 63 U/L (ref 38–126)
BUN: 22 mg/dL — ABNORMAL HIGH (ref 6–20)
CHLORIDE: 104 mmol/L (ref 101–111)
CO2: 25 mmol/L (ref 22–32)
Calcium: 8.9 mg/dL (ref 8.9–10.3)
Creatinine, Ser: 2.11 mg/dL — ABNORMAL HIGH (ref 0.44–1.00)
GFR calc Af Amer: 24 mL/min — ABNORMAL LOW (ref >60)
GFR calc non Af Amer: 21 mL/min — ABNORMAL LOW (ref >60)
GLUCOSE: 228 mg/dL — AB (ref 65–99)
POTASSIUM: 3.8 mmol/L (ref 3.5–5.1)
SODIUM: 136 mmol/L (ref 135–145)
TOTAL PROTEIN: 6.1 g/dL — AB (ref 6.5–8.1)
Total Bilirubin: 0.5 mg/dL (ref 0.3–1.2)

## 2016-02-12 NOTE — Progress Notes (Deleted)
Joann Hunter, Joann Hunter   CLINIC:  Medical Oncology/Hematology  PCP:  Joann Blitz, MD [place] [address] [phone]   REASON FOR VISIT:  Follow-up for Stage IV urothelial carcinoma with liver mets  CURRENT THERAPY: Tecentriq IV every 21 days   BRIEF ONCOLOGIC HISTORY:   Oncology history created as below Oncology History   u     Urothelial carcinoma ([place])   10/22/2015 Imaging    Presented with recurrent UTI and hematuria. Underwent cystoscopy and retrograde evaluation with Dr. Exie Parody revealed multiple filling defects in distal (R) ureter. Marked irregularly of proximal (R) ureter concerning for malignancy. (+) hydronephrosis noted and tumor noted out of ureteral orifice.        11/19/2015 Imaging    MRI abdomen: 3.2 cm mass in segment 4B of left hepatic lobe, suspicious for liver metastasis. Moderate right hydronephrosis and diffuse renal parenchymal atrophy. Abnormal signal intensity throughout right renal collecting system and proximal right ureter, consistent with known urothelial carcinoma. No other sites of abdominal metastatic disease identified.      12/07/2015 Procedure    Liver biopsy: Poorly differentiated carcinoma consistent with metastatic transitional cell histology.       01/01/2016 Initial Diagnosis    Urothelial carcinoma ([place])     02/04/2016 -  Chemotherapy    atezolizumab (TECENTRIQ) 1,200 mg IV every 21 days          HISTORY OF PRESENT ILLNESS:  (From Dr. Donald Pore last note dated 01/27/16)      INTERVAL HISTORY:  [name] presents today for follow-up for Stage IV urothelial carcinoma, s/p cycle #1 Tecentriq on 02/03/16. ***    REVIEW OF SYSTEMS:  Review of Systems - Oncology    PAST MEDICAL/SURGICAL HISTORY:  Past Medical History:  Diagnosis Date  . Arthritis   . Back pain, chronic   . CAD (coronary artery disease)    NSTEMI 04/2011 with newly diagnosed 3V CAD s/p PCI/DES  mLAD 04/11/11 with residual dz (per Dr. Burt Knack, should have consideration of PCI of the occluded RCA based on symptoms and/or consideration of outpatient nuclear perfusion testing after the patient recovers from her infarct)  . Cancer ([place])    kidney ( Nov.2017)  . Chronic kidney disease (CKD), stage IV (severe) ([place]) 12/13/2013  . Hyperglycemia   . Hyperlipidemia   . Hypertension   . Hypothyroidism   . Ischemic cardiomyopathy    EF 45% by cath 04/20/11, 50-55% by echo 04/22/11. hypotension limiting medication titration   . Myocardial infarction 2013  . Type 2 diabetes with nephropathy ([place]) 07/26/2014  . UTI (urinary tract infection) 12/13/2013   Past Surgical History:  Procedure Laterality Date  . BACK SURGERY    . BREAST LUMPECTOMY     right  . CORONARY STENT PLACEMENT    . LEFT HEART CATHETERIZATION WITH CORONARY ANGIOGRAM N/A 04/20/2011   Procedure: LEFT HEART CATHETERIZATION WITH CORONARY ANGIOGRAM;  Surgeon: Burnell Blanks, MD;  Location: [facility] CATH LAB;  Service: Cardiovascular;  Laterality: N/A;  . PERCUTANEOUS CORONARY STENT INTERVENTION (PCI-S) N/A 04/21/2011   Procedure: PERCUTANEOUS CORONARY STENT INTERVENTION (PCI-S);  Surgeon: Sherren Mocha, MD;  Location: [facility] CATH LAB;  Service: Cardiovascular;  Laterality: N/A;     SOCIAL HISTORY:  Social History   Social History  . Marital status: Married    Spouse name: N/A  . Number of children: N/A  . Years of education: N/A   Occupational History  . Not on file.  Social History Main Topics  . Smoking status: Never Smoker  . Smokeless tobacco: Never Used  . Alcohol use No  . Drug use: No  . Sexual activity: No     Comment: married 61 years-husband at [employer]   Other Topics Concern  . Not on file   Social History Narrative   Lives in [place], [place] with her husband and daughter.    FAMILY HISTORY:  Family History  Problem Relation Age of Onset  . Other Mother   . Cancer Father   . Other      no known family  cardiac disease  . Other Brother     Diptheria  . Depression Sister   . Pneumonia Sister   . Other Brother     murdered    CURRENT MEDICATIONS:  Outpatient Encounter Prescriptions as of 02/12/2016  Medication Sig Note  . amLODipine (NORVASC) 10 MG tablet Take 0.5 tablets (5 mg total) by mouth daily.   Marland Kitchen aspirin 81 MG chewable tablet Chew 81 mg by mouth daily.    Huey Bienenstock (TECENTRIQ IV) Inject into the vein.   Marland Kitchen docusate sodium (COLACE) 100 MG capsule Take 1 capsule (100 mg total) by mouth 2 (two) times daily.   . fish oil-omega-3 fatty acids 1000 MG capsule Take 1 g by mouth daily.   Marland Kitchen ibuprofen (ADVIL,MOTRIN) 200 MG tablet Take 200 mg by mouth every 6 (six) hours as needed for moderate pain.   Marland Kitchen levothyroxine (SYNTHROID, LEVOTHROID) 112 MCG tablet Take 1 tablet (112 mcg total) by mouth every morning.   . meclizine (ANTIVERT) 25 MG tablet Take 25 mg by mouth 4 (four) times daily as needed. Dizziness   . metoprolol tartrate (LOPRESSOR) 25 MG tablet Take 25 mg by mouth 2 (two) times daily.    . nitrofurantoin, macrocrystal-monohydrate, (MACROBID) 100 MG capsule Take 1 capsule (100 mg total) by mouth 2 (two) times daily.   . nitroGLYCERIN (NITROSTAT) 0.4 MG SL tablet Place 0.4 mg under the tongue every 5 (five) minutes x 3 doses as needed. For chest pain. 04/04/2014: Has not taken  . ondansetron (ZOFRAN) 8 MG tablet Take 1 tablet (8 mg total) by mouth every 8 (eight) hours as needed for nausea or vomiting.   . ondansetron (ZOFRAN-ODT) 4 MG disintegrating tablet Take 4 mg by mouth every 8 (eight) hours as needed for nausea or vomiting.   . pantoprazole (PROTONIX) 40 MG tablet Take 1 tablet (40 mg total) by mouth daily.   . prochlorperazine (COMPAZINE) 10 MG tablet Take 1 tablet (10 mg total) by mouth every 6 (six) hours as needed for nausea or vomiting.   . traMADol (ULTRAM) 50 MG tablet Take 50 mg by mouth 3 (three) times daily.    . Vitamin D, Ergocalciferol, (DRISDOL) 50000 UNITS CAPS  Take 50,000 Units by mouth 2 (two) times a week. Tuesdays and Thurdays    No facility-administered encounter medications on file as of 02/12/2016.     ALLERGIES:  Allergies  Allergen Reactions  . Ciprofloxacin Other (See Comments)    Hallucination  . Lipitor [Atorvastatin] Other (See Comments)    Muscle Weakness  . Penicillins Rash    Has patient had a PCN reaction causing immediate rash, facial/tongue/throat swelling, SOB or lightheadedness with hypotension: unknown Has patient had a PCN reaction causing severe rash involving mucus membranes or skin necrosis: unknown Has patient had a PCN reaction that required hospitalization: unknown Has patient had a PCN reaction occurring within the last 10 years: unknown  If all of the above answers are "NO", then may proceed with Cephalosporin use. 2     PHYSICAL EXAM:  ECOG Performance status: ***  There were no vitals filed for this visit. There were no vitals filed for this visit.  Physical Exam   LABORATORY DATA:  I have reviewed the labs as listed.  CBC    Component Value Date/Time   WBC 4.3 01/27/2016 1104   RBC 3.72 (L) 01/27/2016 1104   HGB 11.7 (L) 01/27/2016 1104   HCT 34.9 (L) 01/27/2016 1104   PLT 137 (L) 01/27/2016 1104   MCV 93.8 01/27/2016 1104   MCH 31.5 01/27/2016 1104   MCHC 33.5 01/27/2016 1104   RDW 13.0 01/27/2016 1104   LYMPHSABS 1.0 01/27/2016 1104   MONOABS 0.5 01/27/2016 1104   EOSABS 0.1 01/27/2016 1104   BASOSABS 0.0 01/27/2016 1104   CMP Latest Ref Rng & Units 01/27/2016 11/19/2015 09/05/2015  Glucose 65 - 99 mg/dL 238(H) - 167(H)  BUN 6 - 20 mg/dL 27(H) - 18  Creatinine 0.44 - 1.00 mg/dL 2.21(H) 1.70(H) 1.44(H)  Sodium 135 - 145 mmol/L 135 - 133(L)  Potassium 3.5 - 5.1 mmol/L 4.3 - 3.8  Chloride 101 - 111 mmol/L 101 - 104  CO2 22 - 32 mmol/L 27 - 22  Calcium 8.9 - 10.3 mg/dL 8.8(L) - 8.7(L)  Total Protein 6.5 - 8.1 g/dL 6.3(L) - -  Total Bilirubin 0.3 - 1.2 mg/dL 0.4 - -  Alkaline Phos 38 -  126 U/L 65 - -  AST 15 - 41 U/L 14(L) - -  ALT 14 - 54 U/L 9(L) - -    PENDING LABS:    DIAGNOSTIC IMAGING:  MRI abdomen: 11/19/15    PATHOLOGY:  Liver biopsy: 12/07/15     ASSESSMENT & PLAN:   Stage IV urothelial carcinoma with liver metastases:  -s/p cycle #1 Tecentriq; ***       Dispo:  -Return to cancer center in 2 weeks for consideration of cycle #2 Tecentriq.    All questions were answered to patient's stated satisfaction. Encouraged patient to call with any new concerns or questions before her next visit to the cancer center and we can certain see her sooner, if needed.     Plan of care discussed with Dr. ***, who agrees with the above aforementioned.    Mike Craze, NP [place] [phone]

## 2016-02-12 NOTE — Patient Instructions (Addendum)
[place] at [facility] Discharge Instructions  RECOMMENDATIONS MADE BY THE CONSULTANT AND ANY TEST RESULTS WILL BE SENT TO YOUR REFERRING PHYSICIAN.  You were seen today by Mike Craze NP.  Return as scheduled. Call us if you need anything.    Thank you for choosing [facility] at [facility] to provide your oncology and hematology care.  To afford each patient quality time with our provider, please arrive at least 15 minutes before your scheduled appointment time.    If you have a lab appointment with the [facility] please come in thru the  Main Entrance and check in at the main information desk  You need to re-schedule your appointment should you arrive 10 or more minutes late.  We strive to give you quality time with our providers, and arriving late affects you and other patients whose appointments are after yours.  Also, if you no show three or more times for appointments you may be dismissed from the clinic at the providers discretion.     Again, thank you for choosing [facility].  Our hope is that these requests will decrease the amount of time that you wait before being seen by our physicians.       _____________________________________________________________  Should you have questions after your visit to [facility], please contact our office at [phone] between the hours of 8:30 a.m. and 4:30 p.m.  Voicemails left after 4:30 p.m. will not be returned until the following business day.  For prescription refill requests, have your pharmacy contact our office.       Resources For Cancer Patients and their Caregivers ? American Cancer Society: Can assist with transportation, wigs, general needs, runs Look Good Feel Better.        430-342-7798 ? Cancer Care: Provides financial assistance, online support groups, medication/co-pay assistance.  1-800-813-HOPE (513) 695-9070) ? [place] Assists [place] cancer patients and their families through emotional , educational and financial support.  (907)295-5674 ? [place] DSS Where to apply for food stamps, Medicaid and utility assistance. 605 765 2269 ? [employer]: Transportation to medical appointments. (825)777-7439 ? Social Security Administration: May apply for disability if have a Stage IV cancer. (405)024-3644 (587)379-9435 ? [employer], Disability and Transit Services: Assists with nutrition, care and transit needs. [place] Support Programs: @10RELATIVEDAYS @ > Cancer Support Group  2nd Tuesday of the month 1pm-2pm, Journey Room  > Creative Journey  3rd Tuesday of the month 1130am-1pm, Journey Room  > Look Good Feel Better  1st Wednesday of the month 10am-12 noon, Journey Room (Call [facility] to register [phone])

## 2016-02-12 NOTE — Progress Notes (Signed)
Joann Hunter, [place] [id number]   CLINIC:  Medical Oncology/Hematology  PCP:  Joann Blitz, MD [address] [place] [address] [phone]   REASON FOR VISIT:  Follow-up for Stage IV Urothelial Carcinoma with liver mets  CURRENT THERAPY: Tecentriq IV every 21 days   BRIEF ONCOLOGIC HISTORY:    Urothelial cancer (Joann Hunter)   10/22/2015 Imaging    Presented with recurrent UTI and hematuria. Underwent cystoscopy and retrograde evaluation with Dr. Exie Hunter revealed multiple filling defects in distal (R) ureter. Marked irregularly of proximal (R) ureter concerning for malignancy. (+) hydronephrosis noted and tumor noted out of ureteral orifice.        11/19/2015 Imaging    MRI abdomen: 3.2 cm mass in segment 4B of left hepatic lobe, suspicious for liver metastasis. Moderate right hydronephrosis and diffuse renal parenchymal atrophy. Abnormal signal intensity throughout right renal collecting system and proximal right ureter, consistent with known urothelial carcinoma. No other sites of abdominal metastatic disease identified.      12/07/2015 Procedure    Liver biopsy: Poorly differentiated carcinoma consistent with metastatic transitional cell histology.       01/01/2016 Initial Diagnosis    Urothelial carcinoma ([place])     02/04/2016 -  Chemotherapy    atezolizumab (TECENTRIQ) 1,200 mg IV every 21 days         HISTORY OF PRESENT ILLNESS (From Dr. Donald Hunter note on 01/27/2016):  Joann Hunter 83 y.o. femaleis here because of referral from Dr. Alen Hunter for ureteral cancer. The patient lives closer to [facility] and felt it would be more convenient for her and her family to follow-up in [place].  Per Dr. Hazeline Hunter note on 12/18/2015: "83 year old woman with history of hypertension, hyperlipidemia and coronary artery disease. She has been noted to have recurrent UTI and hematuria but subsequently underwent a cystoscopy and retrograde  evaluation by Dr. Exie Hunter in October 2017. Retrograde evaluation showed multiple filling defects in the distal right ureter. There is marked irregularity of the proximal right ureter specialist for malignant process. She also had an ultrasound which showed hydronephrosis and her cystoscopy on 10/22/2015 showed a tumor noted out of the ureteral orifice. MRI of the abdomen obtained on 11/19/2015 showed a 3.2 cm mass of the left hepatic lobe suspicious for liver metastasis. Moderate right hydronephrosis was also noted. No other sites of abdominal metastasis noted. She underwent a biopsy of her liver lesion on 12/07/2015 which showed metastatic carcinoma consistent with transitional cell histology. She was referred to me for evaluation regarding these findings. Clinically, she is asymptomatic from this. She is no longer reporting any hematuria, dysuria or abdominal pain. She does not report any flank pain. Her performance status is limited related to arthritis. She ambulates short distances inside her house. She relies on her daughter for most activities of daily living."   Joann Hunter presents to the [facility] today accompanied by her daughters. She presents in a wheelchair. She is doing well today. Her daughters say she is doing about the same. She would like to try the immunotherapy. She has thought a lot about it over the holidays and notes that she has decided to at least try therapy. She has been very tired. Her appetite is on and off. She denies frank hematuria. No new pain.    INTERVAL HISTORY:  Joann Hunter presents today for follow-up for Stage IV urothelial carcinoma, s/p cycle #1 Tecentriq on 02/03/16. She is seen in a wheelchair, accompanied  by her daughter today.    She has felt pretty good since she completed her 1st Tecentriq treatment.  Her daughter reports "she had a little bit of gas during the infusion, but other than that, she has been fine."  Joann Hunter endorses constipation a few days after  treatment; resolved with a laxative.  Her arthritis remains an issue; she takes Tramadol, aspirin, or ibuprofen as needed; her arthritis pains have been worse with the cold weather.    She remains pretty active at home; her arthritis limits her activity.  Her daughter states that she has noticed that "her mind is sharper. She doesn't seem as cloudy-headed this week."  Her appetite is great.  "I think I have even gained a few pounds."    Denies rash, abdominal pain, diarrhea, N&V, cough, or shortness of breath.  Denies mucositis. No dysuria or hematuria noted.     REVIEW OF SYSTEMS:  Review of Systems  Constitutional: Negative.  Negative for appetite change, chills, fever and unexpected weight change.  HENT:  Negative.  Negative for mouth sores.   Eyes: Negative.   Respiratory: Negative.  Negative for cough and shortness of breath.   Cardiovascular: Negative.   Gastrointestinal: Positive for constipation. Negative for abdominal pain, blood in stool and diarrhea.  Endocrine: Negative.   Genitourinary: Negative.  Negative for dysuria and hematuria.   Musculoskeletal: Positive for arthralgias (chronic arthritis ) and back pain.  Skin: Negative.  Negative for rash.  Neurological: Negative.  Negative for dizziness and headaches.  Hematological: Negative.   Psychiatric/Behavioral: Negative.  Negative for sleep disturbance.  All other systems reviewed and are negative.   PAST MEDICAL/SURGICAL HISTORY:  Past Medical History:  Diagnosis Date  . Arthritis   . Back pain, chronic   . CAD (coronary artery disease)    NSTEMI 04/2011 with newly diagnosed 3V CAD s/p PCI/DES mLAD 04/11/11 with residual dz (per Joann Hunter, should have consideration of PCI of the occluded RCA based on symptoms and/or consideration of outpatient nuclear perfusion testing after the patient recovers from her infarct)  . Cancer ([facility])    kidney ( Nov.2017)  . Chronic kidney disease (CKD), stage IV (severe) ([facility]) 12/13/2013  .  Hyperglycemia   . Hyperlipidemia   . Hypertension   . Hypothyroidism   . Ischemic cardiomyopathy    EF 45% by cath 04/20/11, 50-55% by echo 04/22/11. hypotension limiting medication titration   . Myocardial infarction 2013  . Type 2 diabetes with nephropathy ([facility]) 07/26/2014  . UTI (urinary tract infection) 12/13/2013   Past Surgical History:  Procedure Laterality Date  . BACK SURGERY    . BREAST LUMPECTOMY     right  . CORONARY STENT PLACEMENT    . LEFT HEART CATHETERIZATION WITH CORONARY ANGIOGRAM N/A 04/20/2011   Procedure: LEFT HEART CATHETERIZATION WITH CORONARY ANGIOGRAM;  Surgeon: Burnell Blanks, MD;  Location: [facility] CATH LAB;  Service: Cardiovascular;  Laterality: N/A;  . PERCUTANEOUS CORONARY STENT INTERVENTION (PCI-S) N/A 04/21/2011   Procedure: PERCUTANEOUS CORONARY STENT INTERVENTION (PCI-S);  Surgeon: Sherren Mocha, MD;  Location: [facility] CATH LAB;  Service: Cardiovascular;  Laterality: N/A;     SOCIAL HISTORY:  Social History   Social History  . Marital status: Married    Spouse name: N/A  . Number of children: N/A  . Years of education: N/A   Occupational History  . Not on file.   Social History Main Topics  . Smoking status: Never Smoker  . Smokeless tobacco: Never Used  .  Alcohol use No  . Drug use: No  . Sexual activity: No     Comment: married 61 years-husband at [employer]   Other Topics Concern  . Not on file   Social History Narrative   Lives in [place], [place] with her husband and daughter.    FAMILY HISTORY:  Family History  Problem Relation Age of Onset  . Other Mother   . Cancer Father   . Other      no known family cardiac disease  . Other Brother     Diptheria  . Depression Sister   . Pneumonia Sister   . Other Brother     murdered    CURRENT MEDICATIONS:  Outpatient Encounter Prescriptions as of 02/12/2016  Medication Sig Note  . amLODipine (NORVASC) 10 MG tablet Take 0.5 tablets (5 mg total) by mouth daily.   Marland Kitchen aspirin 81 MG  chewable tablet Chew 81 mg by mouth daily.    Huey Bienenstock (TECENTRIQ IV) Inject into the vein.   Marland Kitchen docusate sodium (COLACE) 100 MG capsule Take 1 capsule (100 mg total) by mouth 2 (two) times daily.   . fish oil-omega-3 fatty acids 1000 MG capsule Take 1 g by mouth daily.   Marland Kitchen ibuprofen (ADVIL,MOTRIN) 200 MG tablet Take 200 mg by mouth every 6 (six) hours as needed for moderate pain.   Marland Kitchen levothyroxine (SYNTHROID, LEVOTHROID) 112 MCG tablet Take 1 tablet (112 mcg total) by mouth every morning.   . meclizine (ANTIVERT) 25 MG tablet Take 25 mg by mouth 4 (four) times daily as needed. Dizziness   . metoprolol tartrate (LOPRESSOR) 25 MG tablet Take 25 mg by mouth 2 (two) times daily.    . nitrofurantoin, macrocrystal-monohydrate, (MACROBID) 100 MG capsule Take 1 capsule (100 mg total) by mouth 2 (two) times daily.   . nitroGLYCERIN (NITROSTAT) 0.4 MG SL tablet Place 0.4 mg under the tongue every 5 (five) minutes x 3 doses as needed. For chest pain. 04/04/2014: Has not taken  . ondansetron (ZOFRAN) 8 MG tablet Take 1 tablet (8 mg total) by mouth every 8 (eight) hours as needed for nausea or vomiting.   . ondansetron (ZOFRAN-ODT) 4 MG disintegrating tablet Take 4 mg by mouth every 8 (eight) hours as needed for nausea or vomiting.   . pantoprazole (PROTONIX) 40 MG tablet Take 1 tablet (40 mg total) by mouth daily.   . prochlorperazine (COMPAZINE) 10 MG tablet Take 1 tablet (10 mg total) by mouth every 6 (six) hours as needed for nausea or vomiting.   . traMADol (ULTRAM) 50 MG tablet Take 50 mg by mouth 3 (three) times daily.    . Vitamin D, Ergocalciferol, (DRISDOL) 50000 UNITS CAPS Take 50,000 Units by mouth 2 (two) times a week. Tuesdays and Thurdays    No facility-administered encounter medications on file as of 02/12/2016.      ALLERGIES:  Allergies  Allergen Reactions  . Ciprofloxacin Other (See Comments)    Hallucination  . Lipitor [Atorvastatin] Other (See Comments)    Muscle Weakness  .  Penicillins Rash    Has patient had a PCN reaction causing immediate rash, facial/tongue/throat swelling, SOB or lightheadedness with hypotension: unknown Has patient had a PCN reaction causing severe rash involving mucus membranes or skin necrosis: unknown Has patient had a PCN reaction that required hospitalization: unknown Has patient had a PCN reaction occurring within the last 10 years: unknown If all of the above answers are "NO", then may proceed with Cephalosporin use. 2  PHYSICAL EXAM:  ECOG Performance status: 2 - Symptomatic; requires assistance   Vitals:   02/12/16 1039  BP: (!) 122/45  Pulse: (!) 52  Resp: 16  Temp: 97.8 F (36.6 C)   Filed Weights   02/12/16 1039  Weight: 155 lb (70.3 kg)   Physical Exam  Constitutional: She is oriented to person, place, and time and well-developed, well-nourished, and in no distress.  Exam done from wheelchair.   HENT:  Head: Normocephalic and atraumatic.  Mouth/Throat: Oropharynx is clear and moist. No oropharyngeal exudate.  She is hard of hearing   Eyes: Conjunctivae are normal. Pupils are equal, round, and reactive to light. No scleral icterus.  Neck: Normal range of motion. Neck supple.  Cardiovascular: Normal rate, regular rhythm and normal heart sounds.   Pulmonary/Chest: Effort normal and breath sounds normal. No respiratory distress. She has no wheezes.  Abdominal: Soft. Bowel sounds are normal. There is no tenderness.  Musculoskeletal: Normal range of motion. She exhibits no edema.  Lymphadenopathy:    She has no cervical adenopathy.  Neurological: She is alert and oriented to person, place, and time.  Skin: Skin is warm and dry. No rash noted.  Psychiatric: Mood, memory, affect and judgment normal.  Nursing note and vitals reviewed.   LABORATORY DATA:  I have reviewed the labs as listed.  CBC    Component Value Date/Time   WBC 3.6 (L) 02/12/2016 0914   RBC 3.80 (L) 02/12/2016 0914   HGB 11.9 (L)  02/12/2016 0914   HCT 35.1 (L) 02/12/2016 0914   PLT 163 02/12/2016 0914   MCV 92.4 02/12/2016 0914   MCH 31.3 02/12/2016 0914   MCHC 33.9 02/12/2016 0914   RDW 13.4 02/12/2016 0914   LYMPHSABS 0.8 02/12/2016 0914   MONOABS 0.4 02/12/2016 0914   EOSABS 0.1 02/12/2016 0914   BASOSABS 0.0 02/12/2016 0914   CMP Latest Ref Rng & Units 02/12/2016 01/27/2016 11/19/2015  Glucose 65 - 99 mg/dL 228(H) 238(H) -  BUN 6 - 20 mg/dL 22(H) 27(H) -  Creatinine 0.44 - 1.00 mg/dL 2.11(H) 2.21(H) 1.70(H)  Sodium 135 - 145 mmol/L 136 135 -  Potassium 3.5 - 5.1 mmol/L 3.8 4.3 -  Chloride 101 - 111 mmol/L 104 101 -  CO2 22 - 32 mmol/L 25 27 -  Calcium 8.9 - 10.3 mg/dL 8.9 8.8(L) -  Total Protein 6.5 - 8.1 g/dL 6.1(L) 6.3(L) -  Total Bilirubin 0.3 - 1.2 mg/dL 0.5 0.4 -  Alkaline Phos 38 - 126 U/L 63 65 -  AST 15 - 41 U/L 17 14(L) -  ALT 14 - 54 U/L 9(L) 9(L) -    PENDING LABS:    DIAGNOSTIC IMAGING:  MRI abdomen: 11/19/15     PATHOLOGY:  Liver biopsy: 12/07/15    ASSESSMENT & PLAN:   Stage IV Urothelial carcinoma, multifocal R ureteral masses with liver metastases -No dysuria or hematuria.  -s/p cycle #1 Tecentriq; she tolerated very well.  Largely, no complaints from therapy.  -We again reviewed her goals of care. She understands that she has incurable disease. She wishes to try immunotherapy to "slow" her disease process.  -Labs reviewed. Her serum glucose is 228 today (previously 238 before treatment). Tecentriq can lead to endocrinopathies, including hyperglycemia. Her blood sugars are largely stable.  We discussed that given her age and frailty, I would not recommend starting insulin at this point d/t concerns for hypoglycemia and increased risk for falls. We will continue to monitor.   -Consider repeat imaging  after several cycles of Tecentriq.  -Return to cancer center in 2 weeks for cycle #2; we will see her before cycle #3 of treatment.      Dispo:  -Return to cancer center in 2  weeks for cycle #2 Tecentriq.  -We will see her for follow-up visit before her 3rd cycle of Tecentriq.    All questions were answered to patient's stated satisfaction. Encouraged patient to call with any new concerns or questions before her next visit to the cancer center and we can certain see her sooner, if needed.     This document serves as a record of services personally performed by Mike Craze, NP. It was created on her behalf by Martinique Casey, a trained medical scribe. The creation of this record is based on the scribe's personal observations and the provider's statements to them. This document has been checked and approved by the attending provider.   I have reviewed the above documentation for accuracy and completeness and I agree with the above.  Mike Craze, NP [place] [phone]

## 2016-02-20 ENCOUNTER — Other Ambulatory Visit: Payer: Self-pay

## 2016-02-20 ENCOUNTER — Emergency Department (HOSPITAL_COMMUNITY): Payer: Medicare Other

## 2016-02-20 ENCOUNTER — Inpatient Hospital Stay (HOSPITAL_COMMUNITY)
Admission: EM | Admit: 2016-02-20 | Discharge: 2016-02-22 | DRG: 070 | Disposition: A | Discharge status: Home Health | Payer: Medicare Other | Attending: Internal Medicine | Admitting: Internal Medicine

## 2016-02-20 ENCOUNTER — Encounter (HOSPITAL_COMMUNITY): Payer: Self-pay | Admitting: Emergency Medicine

## 2016-02-20 DIAGNOSIS — N390 Urinary tract infection, site not specified: Secondary | ICD-10-CM | POA: Diagnosis not present

## 2016-02-20 DIAGNOSIS — R4182 Altered mental status, unspecified: Secondary | ICD-10-CM | POA: Diagnosis not present

## 2016-02-20 DIAGNOSIS — Z7982 Long term (current) use of aspirin: Secondary | ICD-10-CM

## 2016-02-20 DIAGNOSIS — A499 Bacterial infection, unspecified: Secondary | ICD-10-CM | POA: Diagnosis not present

## 2016-02-20 DIAGNOSIS — G9341 Metabolic encephalopathy: Secondary | ICD-10-CM | POA: Diagnosis not present

## 2016-02-20 DIAGNOSIS — Z1612 Extended spectrum beta lactamase (ESBL) resistance: Secondary | ICD-10-CM | POA: Diagnosis not present

## 2016-02-20 DIAGNOSIS — D701 Agranulocytosis secondary to cancer chemotherapy: Secondary | ICD-10-CM | POA: Diagnosis not present

## 2016-02-20 DIAGNOSIS — I10 Essential (primary) hypertension: Secondary | ICD-10-CM | POA: Diagnosis present

## 2016-02-20 DIAGNOSIS — E1121 Type 2 diabetes mellitus with diabetic nephropathy: Secondary | ICD-10-CM | POA: Diagnosis present

## 2016-02-20 DIAGNOSIS — T451X5A Adverse effect of antineoplastic and immunosuppressive drugs, initial encounter: Secondary | ICD-10-CM | POA: Diagnosis present

## 2016-02-20 DIAGNOSIS — E1122 Type 2 diabetes mellitus with diabetic chronic kidney disease: Secondary | ICD-10-CM | POA: Diagnosis present

## 2016-02-20 DIAGNOSIS — R479 Unspecified speech disturbances: Secondary | ICD-10-CM

## 2016-02-20 DIAGNOSIS — N184 Chronic kidney disease, stage 4 (severe): Secondary | ICD-10-CM | POA: Diagnosis not present

## 2016-02-20 DIAGNOSIS — D696 Thrombocytopenia, unspecified: Secondary | ICD-10-CM | POA: Diagnosis present

## 2016-02-20 DIAGNOSIS — D709 Neutropenia, unspecified: Secondary | ICD-10-CM | POA: Diagnosis present

## 2016-02-20 DIAGNOSIS — R4701 Aphasia: Secondary | ICD-10-CM | POA: Diagnosis present

## 2016-02-20 DIAGNOSIS — I252 Old myocardial infarction: Secondary | ICD-10-CM

## 2016-02-20 DIAGNOSIS — Z79899 Other long term (current) drug therapy: Secondary | ICD-10-CM

## 2016-02-20 DIAGNOSIS — I129 Hypertensive chronic kidney disease with stage 1 through stage 4 chronic kidney disease, or unspecified chronic kidney disease: Secondary | ICD-10-CM | POA: Diagnosis present

## 2016-02-20 DIAGNOSIS — I2510 Atherosclerotic heart disease of native coronary artery without angina pectoris: Secondary | ICD-10-CM | POA: Diagnosis present

## 2016-02-20 DIAGNOSIS — R410 Disorientation, unspecified: Secondary | ICD-10-CM | POA: Diagnosis present

## 2016-02-20 DIAGNOSIS — D61810 Antineoplastic chemotherapy induced pancytopenia: Secondary | ICD-10-CM | POA: Diagnosis present

## 2016-02-20 DIAGNOSIS — C787 Secondary malignant neoplasm of liver and intrahepatic bile duct: Secondary | ICD-10-CM | POA: Diagnosis present

## 2016-02-20 DIAGNOSIS — I255 Ischemic cardiomyopathy: Secondary | ICD-10-CM | POA: Diagnosis present

## 2016-02-20 DIAGNOSIS — Z794 Long term (current) use of insulin: Secondary | ICD-10-CM

## 2016-02-20 DIAGNOSIS — C679 Malignant neoplasm of bladder, unspecified: Secondary | ICD-10-CM | POA: Diagnosis present

## 2016-02-20 DIAGNOSIS — Z955 Presence of coronary angioplasty implant and graft: Secondary | ICD-10-CM

## 2016-02-20 DIAGNOSIS — E039 Hypothyroidism, unspecified: Secondary | ICD-10-CM | POA: Diagnosis present

## 2016-02-20 LAB — COMPREHENSIVE METABOLIC PANEL
ALBUMIN: 2.9 g/dL — AB (ref 3.5–5.0)
ALK PHOS: 75 U/L (ref 38–126)
ALT: 31 U/L (ref 14–54)
ANION GAP: 8 (ref 5–15)
AST: 43 U/L — ABNORMAL HIGH (ref 15–41)
BILIRUBIN TOTAL: 1.4 mg/dL — AB (ref 0.3–1.2)
BUN: 28 mg/dL — ABNORMAL HIGH (ref 6–20)
CALCIUM: 8.8 mg/dL — AB (ref 8.9–10.3)
CO2: 25 mmol/L (ref 22–32)
CREATININE: 1.7 mg/dL — AB (ref 0.44–1.00)
Chloride: 103 mmol/L (ref 101–111)
GFR calc non Af Amer: 27 mL/min — ABNORMAL LOW (ref >60)
GFR, EST AFRICAN AMERICAN: 31 mL/min — AB (ref >60)
GLUCOSE: 162 mg/dL — AB (ref 65–99)
Potassium: 3.9 mmol/L (ref 3.5–5.1)
SODIUM: 136 mmol/L (ref 135–145)
TOTAL PROTEIN: 6 g/dL — AB (ref 6.5–8.1)

## 2016-02-20 LAB — CBC
HCT: 32.5 % — ABNORMAL LOW (ref 36.0–46.0)
HEMOGLOBIN: 11.2 g/dL — AB (ref 12.0–15.0)
MCH: 31.1 pg (ref 26.0–34.0)
MCHC: 34.5 g/dL (ref 30.0–36.0)
MCV: 90.3 fL (ref 78.0–100.0)
PLATELETS: 42 10*3/uL — AB (ref 150–400)
RBC: 3.6 MIL/uL — ABNORMAL LOW (ref 3.87–5.11)
RDW: 13.8 % (ref 11.5–15.5)
WBC: 2 10*3/uL — ABNORMAL LOW (ref 4.0–10.5)

## 2016-02-20 LAB — URINALYSIS, ROUTINE W REFLEX MICROSCOPIC
Bilirubin Urine: NEGATIVE
GLUCOSE, UA: NEGATIVE mg/dL
Ketones, ur: 5 mg/dL — AB
Leukocytes, UA: NEGATIVE
Nitrite: NEGATIVE
PH: 5 (ref 5.0–8.0)
Protein, ur: 30 mg/dL — AB
SPECIFIC GRAVITY, URINE: 1.017 (ref 1.005–1.030)

## 2016-02-20 LAB — GLUCOSE, CAPILLARY: Glucose-Capillary: 122 mg/dL — ABNORMAL HIGH (ref 65–99)

## 2016-02-20 LAB — CBG MONITORING, ED: GLUCOSE-CAPILLARY: 147 mg/dL — AB (ref 65–99)

## 2016-02-20 MED ORDER — MECLIZINE HCL 12.5 MG PO TABS: 25.0000 mg | ORAL_TABLET | Freq: Four times a day (QID) | ORAL | Status: DC | PRN

## 2016-02-20 MED ORDER — SODIUM CHLORIDE 0.9 % IV SOLN
500.0000 mg | Freq: Two times a day (BID) | INTRAVENOUS | Status: DC
  Administered 2016-02-21 – 2016-02-22 (×3): 500 mg via INTRAVENOUS
  Filled 2016-02-20 (×5): qty 0.5

## 2016-02-20 MED ORDER — SENNOSIDES-DOCUSATE SODIUM 8.6-50 MG PO TABS: 1.0000 | ORAL_TABLET | Freq: Every evening | ORAL | Status: DC | PRN

## 2016-02-20 MED ORDER — IBUPROFEN 400 MG PO TABS: 200.0000 mg | ORAL_TABLET | Freq: Four times a day (QID) | ORAL | Status: DC | PRN

## 2016-02-20 MED ORDER — TRAMADOL HCL 50 MG PO TABS
50.0000 mg | ORAL_TABLET | Freq: Three times a day (TID) | ORAL | Status: DC
  Administered 2016-02-20 – 2016-02-22 (×5): 50 mg via ORAL
  Filled 2016-02-20 (×5): qty 1

## 2016-02-20 MED ORDER — DEXTROSE 5 % IV SOLN: 1.0000 g | INTRAVENOUS | Status: DC

## 2016-02-20 MED ORDER — SODIUM CHLORIDE 0.9 % IV SOLN
1.0000 g | Freq: Once | INTRAVENOUS | Status: AC
  Administered 2016-02-20: 1 g via INTRAVENOUS
  Filled 2016-02-20 (×2): qty 1

## 2016-02-20 MED ORDER — INSULIN ASPART 100 UNIT/ML ~~LOC~~ SOLN: 0.0000 [IU] | Freq: Every day | SUBCUTANEOUS | Status: DC

## 2016-02-20 MED ORDER — ACETAMINOPHEN 650 MG RE SUPP: 650.0000 mg | Freq: Four times a day (QID) | RECTAL | Status: DC | PRN

## 2016-02-20 MED ORDER — DEXTROSE 5 % IV SOLN
INTRAVENOUS | Status: AC
  Filled 2016-02-20: qty 10

## 2016-02-20 MED ORDER — ONDANSETRON HCL 4 MG PO TABS: 4.0000 mg | ORAL_TABLET | Freq: Four times a day (QID) | ORAL | Status: DC | PRN

## 2016-02-20 MED ORDER — METOPROLOL TARTRATE 25 MG PO TABS
12.5000 mg | ORAL_TABLET | Freq: Two times a day (BID) | ORAL | Status: DC
  Administered 2016-02-20 – 2016-02-22 (×4): 12.5 mg via ORAL
  Filled 2016-02-20 (×4): qty 1

## 2016-02-20 MED ORDER — LEVOTHYROXINE SODIUM 112 MCG PO TABS
112.0000 ug | ORAL_TABLET | Freq: Every day | ORAL | Status: DC
  Administered 2016-02-21 – 2016-02-22 (×2): 112 ug via ORAL
  Filled 2016-02-20 (×2): qty 1

## 2016-02-20 MED ORDER — SODIUM CHLORIDE 0.9 % IV BOLUS (SEPSIS)
500.0000 mL | Freq: Once | INTRAVENOUS | Status: AC
  Administered 2016-02-20: 500 mL via INTRAVENOUS

## 2016-02-20 MED ORDER — HYDRALAZINE HCL 20 MG/ML IJ SOLN
10.0000 mg | Freq: Once | INTRAMUSCULAR | Status: AC
  Administered 2016-02-20: 10 mg via INTRAVENOUS
  Filled 2016-02-20: qty 1

## 2016-02-20 MED ORDER — INSULIN ASPART 100 UNIT/ML ~~LOC~~ SOLN
0.0000 [IU] | Freq: Three times a day (TID) | SUBCUTANEOUS | Status: DC
  Administered 2016-02-21: 2 [IU] via SUBCUTANEOUS
  Administered 2016-02-21: 3 [IU] via SUBCUTANEOUS
  Administered 2016-02-21 – 2016-02-22 (×2): 2 [IU] via SUBCUTANEOUS

## 2016-02-20 MED ORDER — DOCUSATE SODIUM 100 MG PO CAPS
100.0000 mg | ORAL_CAPSULE | Freq: Two times a day (BID) | ORAL | Status: DC
  Administered 2016-02-20 – 2016-02-22 (×4): 100 mg via ORAL
  Filled 2016-02-20 (×4): qty 1

## 2016-02-20 MED ORDER — ONDANSETRON HCL 4 MG/2ML IJ SOLN: 4.0000 mg | Freq: Four times a day (QID) | INTRAMUSCULAR | Status: DC | PRN

## 2016-02-20 MED ORDER — ASPIRIN 81 MG PO CHEW
81.0000 mg | CHEWABLE_TABLET | Freq: Every day | ORAL | Status: DC
  Administered 2016-02-21 – 2016-02-22 (×2): 81 mg via ORAL
  Filled 2016-02-20 (×2): qty 1

## 2016-02-20 MED ORDER — SODIUM CHLORIDE 0.9 % IV SOLN
INTRAVENOUS | Status: AC
  Administered 2016-02-20: 22:00:00 via INTRAVENOUS

## 2016-02-20 MED ORDER — AMLODIPINE BESYLATE 5 MG PO TABS
5.0000 mg | ORAL_TABLET | Freq: Every day | ORAL | Status: DC
  Administered 2016-02-21 – 2016-02-22 (×2): 5 mg via ORAL
  Filled 2016-02-20 (×2): qty 1

## 2016-02-20 MED ORDER — PANTOPRAZOLE SODIUM 40 MG PO TBEC
40.0000 mg | DELAYED_RELEASE_TABLET | Freq: Every day | ORAL | Status: DC
  Administered 2016-02-21 – 2016-02-22 (×2): 40 mg via ORAL
  Filled 2016-02-20 (×2): qty 1

## 2016-02-20 MED ORDER — ACETAMINOPHEN 325 MG PO TABS: 650.0000 mg | ORAL_TABLET | Freq: Four times a day (QID) | ORAL | Status: DC | PRN

## 2016-02-20 NOTE — Progress Notes (Signed)
Pharmacy Antibiotic Note  Joann Hunter is a 83 y.o. female admitted on 02/20/2016 with UTI.  Pharmacy has been consulted for Meropenem dosing.  Plan: Meropenem 1 GM IV loading dose, then 500 mg IV every 12 hours Labs per protocol  Height: 5\' 3"  (160 cm) Weight: 155 lb (70.3 kg) IBW/kg (Calculated) : 52.4  Temp (24hrs), Avg:98.3 F (36.8 C), Min:98.3 F (36.8 C), Max:98.3 F (36.8 C)   Recent Labs Lab 02/20/16 1558  WBC 2.0*  CREATININE 1.70*    Estimated Creatinine Clearance: 24.4 mL/min (by C-G formula based on SCr of 1.7 mg/dL (H)).    Allergies  Allergen Reactions  . Ciprofloxacin Other (See Comments)    Hallucination  . Lipitor [Atorvastatin] Other (See Comments)    Muscle Weakness  . Penicillins Rash    Has patient had a PCN reaction causing immediate rash, facial/tongue/throat swelling, SOB or lightheadedness with hypotension: unknown Has patient had a PCN reaction causing severe rash involving mucus membranes or skin necrosis: unknown Has patient had a PCN reaction that required hospitalization: unknown Has patient had a PCN reaction occurring within the last 10 years: unknown If all of the above answers are "NO", then may proceed with Cephalosporin use. 2    Antimicrobials this admission: Meropenem 2/17 >>    Thank you for allowing pharmacy to be a part of this patient's care.  Chriss Czar 02/20/2016 8:02 PM

## 2016-02-20 NOTE — H&P (Signed)
History and Physical  Joann Hunter [id number] DOB: 1932/06/24 DOA: 02/20/2016  Referring physician: Dr Tomi Bamberger, ED physician PCP: Monico Blitz, MD  Outpatient Specialists:   Dr Whitney Muse (oncology)  Dr Exie Parody (Urology)  Patient Coming From: home  Chief Complaint: confusion  HPI: Joann Hunter is a 83 y.o. female with a history of coronary artery disease with history of non-ST elevation MI in 2013, stage IV chronic kidney disease, hypothyroidism, hypertension, type 2 diabetes diet-controlled, urothelial cancer with metastases to the liver, currently undergoing chemotherapy. Patient has had frequent UTIs in the past and has been hospitalized a couple times for these. She was seen by her primary care physician on Thursday due to some malaise and diminished appetite. She was diagnosed with a urinary tract infection was prescribed ciprofloxacin. She took her first dose Thursday night. During the day Friday, she began to be confused and began to "talk out of her mind". Her daughter states that the patient was up all last night talking and thinking it was daytime. The patient was trying to cook food in the middle the night wanted her daughter to take her on the car. She denies fevers, chills, nausea, vomiting.  Emergency Department Course: Vitals are normal the emergency department, UA not overly indicative of infection. CBC shows neutropenia and thrombocytopenia. CMP shows slightly elevated bilirubin and AST. Creatinine is normal.  In the emergency department, the patient did have an episode of aphasia - the patient began to talk, but none of the words made sense. This was observed by the ER physician.  Review of Systems:   Pt complains of diminished appetite.  Pt denies any fevers, chills, nausea, vomiting, diarrhea, constipation, abdominal pain, shortness of breath, dyspnea on exertion, orthopnea, cough, wheezing, palpitations, headache, vision changes, lightheadedness, dizziness, melena,  rectal bleeding.  Review of systems are otherwise negative  Past Medical History:  Diagnosis Date  . Arthritis   . Back pain, chronic   . CAD (coronary artery disease)    NSTEMI 04/2011 with newly diagnosed 3V CAD s/p PCI/DES mLAD 04/11/11 with residual dz (per Dr. Burt Knack, should have consideration of PCI of the occluded RCA based on symptoms and/or consideration of outpatient nuclear perfusion testing after the patient recovers from her infarct)  . Cancer ([place])    kidney ( Nov.2017)  . Chronic kidney disease (CKD), stage IV (severe) ([place]) 12/13/2013  . Hyperglycemia   . Hyperlipidemia   . Hypertension   . Hypothyroidism   . Ischemic cardiomyopathy    EF 45% by cath 04/20/11, 50-55% by echo 04/22/11. hypotension limiting medication titration   . Myocardial infarction 2013  . Type 2 diabetes with nephropathy ([place]) 07/26/2014  . UTI (urinary tract infection) 12/13/2013   Past Surgical History:  Procedure Laterality Date  . BACK SURGERY    . BREAST LUMPECTOMY     right  . CORONARY STENT PLACEMENT    . LEFT HEART CATHETERIZATION WITH CORONARY ANGIOGRAM N/A 04/20/2011   Procedure: LEFT HEART CATHETERIZATION WITH CORONARY ANGIOGRAM;  Surgeon: Burnell Blanks, MD;  Location: [facility] CATH LAB;  Service: Cardiovascular;  Laterality: N/A;  . PERCUTANEOUS CORONARY STENT INTERVENTION (PCI-S) N/A 04/21/2011   Procedure: PERCUTANEOUS CORONARY STENT INTERVENTION (PCI-S);  Surgeon: Sherren Mocha, MD;  Location: [facility] CATH LAB;  Service: Cardiovascular;  Laterality: N/A;   Social History:  reports that she has never smoked. She has never used smokeless tobacco. She reports that she does not drink alcohol or use drugs. Patient lives at [place]  .  Ciprofloxacin Other (See Comments)    Hallucination  . Lipitor [Atorvastatin] Other (See Comments)    Muscle Weakness  . Penicillins Rash    Has patient had a PCN reaction causing immediate rash, facial/tongue/throat swelling, SOB  or lightheadedness with hypotension: unknown Has patient had a PCN reaction causing severe rash involving mucus membranes or skin necrosis: unknown Has patient had a PCN reaction that required hospitalization: unknown Has patient had a PCN reaction occurring within the last 10 years: unknown If all of the above answers are "NO", then may proceed with Cephalosporin use. 2    Family History  Problem Relation Age of Onset  . Other Mother   . Cancer Father   . Other      no known family cardiac disease  . Other Brother     Diptheria  . Depression Sister   . Pneumonia Sister   . Other Brother     murdered      Prior to Admission medications   Medication Sig Start Date End Date Taking? Authorizing Provider  amLODipine (NORVASC) 10 MG tablet Take 0.5 tablets (5 mg total) by mouth daily. 09/18/14  Yes Samuella Cota, MD  aspirin 81 MG chewable tablet Chew 81 mg by mouth daily.    Yes Historical Provider, MD  Atezolizumab (TECENTRIQ IV) Inject into the vein every 21 ( twenty-one) days.   Yes Historical Provider, MD  docusate sodium (COLACE) 100 MG capsule Take 1 capsule (100 mg total) by mouth 2 (two) times daily. 11/06/14  Yes Kathie Dike, MD  fish oil-omega-3 fatty acids 1000 MG capsule Take 1 g by mouth daily.   Yes Historical Provider, MD  ibuprofen (ADVIL,MOTRIN) 200 MG tablet Take 200 mg by mouth every 6 (six) hours as needed for moderate pain.   Yes Historical Provider, MD  levothyroxine (SYNTHROID, LEVOTHROID) 112 MCG tablet Take 1 tablet (112 mcg total) by mouth every morning. 12/16/13  Yes Verlee Monte, MD  meclizine (ANTIVERT) 25 MG tablet Take 25 mg by mouth 4 (four) times daily as needed. Dizziness   Yes Historical Provider, MD  metoprolol tartrate (LOPRESSOR) 25 MG tablet Take 12.5 mg by mouth 2 (two) times daily.  04/23/11  Yes Dayna N Dunn, PA-C  nitrofurantoin, macrocrystal-monohydrate, (MACROBID) 100 MG capsule Take 1 capsule (100 mg total) by mouth 2 (two) times daily.  07/17/15  Yes Nat Christen, MD  nitroGLYCERIN (NITROSTAT) 0.4 MG SL tablet Place 0.4 mg under the tongue every 5 (five) minutes x 3 doses as needed. For chest pain. 04/23/11  Yes Dayna N Dunn, PA-C  pantoprazole (PROTONIX) 40 MG tablet Take 1 tablet (40 mg total) by mouth daily. 11/06/14  Yes Kathie Dike, MD  traMADol (ULTRAM) 50 MG tablet Take 50 mg by mouth 3 (three) times daily.  04/26/14  Yes Historical Provider, MD  Vitamin D, Ergocalciferol, (DRISDOL) 50000 UNITS CAPS Take 50,000 Units by mouth 2 (two) times a week. Tuesdays and Thurdays 05/30/12  Yes Historical Provider, MD  ondansetron (ZOFRAN) 8 MG tablet Take 1 tablet (8 mg total) by mouth every 8 (eight) hours as needed for nausea or vomiting. Patient not taking: Reported on 02/20/2016 01/29/16   Patrici Ranks, MD  prochlorperazine (COMPAZINE) 10 MG tablet Take 1 tablet (10 mg total) by mouth every 6 (six) hours as needed for nausea or vomiting. Patient not taking: Reported on 02/20/2016 01/29/16   Patrici Ranks, MD    Physical Exam: BP 167/71   Pulse 78   Temp 98.3  F (36.8 C) (Oral)   Resp 20   Ht 5\' 3"  (1.6 m)   Wt 70.3 kg (155 lb)   SpO2 96%   BMI 27.46 kg/m   General: Elderly Caucasian female. Awake and alert and oriented and in place, reports time as November 2089. No acute cardiopulmonary distress.  HEENT: Normocephalic atraumatic.  Right and left ears normal in appearance.  Pupils equal, round, reactive to light. Extraocular muscles are intact. Sclerae anicteric and noninjected.  Moist mucosal membranes. No mucosal lesions.  Neck: Neck supple without lymphadenopathy. No carotid bruits. No masses palpated.  Cardiovascular: Regular rate with normal S1-S2 sounds. No murmurs, rubs, gallops auscultated. No JVD.  Respiratory: Good respiratory effort with no wheezes, rales, rhonchi. Lungs clear to auscultation bilaterally.  No accessory muscle use. Abdomen: Soft, nontender, nondistended. Active bowel sounds. No masses or  hepatosplenomegaly  Skin: No rashes, lesions, or ulcerations.  Dry, warm to touch. 2+ dorsalis pedis and radial pulses. Musculoskeletal: No calf or leg pain. All major joints not erythematous nontender.  No upper or lower joint deformation.  Good ROM.  No contractures  Psychiatric: Intact judgment and insight. Pleasant and cooperative. Neurologic: No focal neurological deficits. Strength is 5/5 and symmetric in upper and lower extremities.  Cranial nerves II through XII are grossly intact.           Labs on Admission: I have personally reviewed following labs and imaging studies  CBC:  Recent Labs Lab 02/20/16 1558  WBC 2.0*  HGB 11.2*  HCT 32.5*  MCV 90.3  PLT 42*   Basic Metabolic Panel:  Recent Labs Lab 02/20/16 1558  NA 136  K 3.9  CL 103  CO2 25  GLUCOSE 162*  BUN 28*  CREATININE 1.70*  CALCIUM 8.8*   GFR: Estimated Creatinine Clearance: 24.4 mL/min (by C-G formula based on SCr of 1.7 mg/dL (H)). Liver Function Tests:  Recent Labs Lab 02/20/16 1558  AST 43*  ALT 31  ALKPHOS 75  BILITOT 1.4*  PROT 6.0*  ALBUMIN 2.9*   No results for input(s): LIPASE, AMYLASE in the last 168 hours. No results for input(s): AMMONIA in the last 168 hours. Coagulation Profile: No results for input(s): INR, PROTIME in the last 168 hours. Cardiac Enzymes: No results for input(s): CKTOTAL, CKMB, CKMBINDEX, TROPONINI in the last 168 hours. BNP (last 3 results) No results for input(s): PROBNP in the last 8760 hours. HbA1C: No results for input(s): HGBA1C in the last 72 hours. CBG:  Recent Labs Lab 02/20/16 1626  GLUCAP 147*   Lipid Profile: No results for input(s): CHOL, HDL, LDLCALC, TRIG, CHOLHDL, LDLDIRECT in the last 72 hours. Thyroid Function Tests: No results for input(s): TSH, T4TOTAL, FREET4, T3FREE, THYROIDAB in the last 72 hours. Anemia Panel: No results for input(s): VITAMINB12, FOLATE, FERRITIN, TIBC, IRON, RETICCTPCT in the last 72 hours. Urine  analysis:    Component Value Date/Time   COLORURINE YELLOW 02/20/2016 1501   APPEARANCEUR CLEAR 02/20/2016 1501   LABSPEC 1.017 02/20/2016 1501   PHURINE 5.0 02/20/2016 1501   GLUCOSEU NEGATIVE 02/20/2016 1501   HGBUR LARGE (A) 02/20/2016 1501   BILIRUBINUR NEGATIVE 02/20/2016 1501   KETONESUR 5 (A) 02/20/2016 1501   PROTEINUR 30 (A) 02/20/2016 1501   UROBILINOGEN 0.2 11/05/2014 2000   NITRITE NEGATIVE 02/20/2016 1501   LEUKOCYTESUR NEGATIVE 02/20/2016 1501   Sepsis Labs: @LABRCNTIP (procalcitonin:4,lacticidven:4) )No results found for this or any previous visit (from the past 240 hour(s)).   Radiological Exams on Admission: Dg Chest 2 View  Result Date: 02/20/2016 CLINICAL DATA:  Patient with confusion. Speech problems. Headache, dizziness and weakness. EXAM: CHEST  2 VIEW COMPARISON:  Chest radiograph 09/14/2014. FINDINGS: Patient is rotated to the left. Cardiomegaly. Aortic atherosclerosis. Monitoring leads overlie the patient. Bilateral interstitial pulmonary opacities. No large area pulmonary consolidation. Thoracic spine degenerative changes. No pleural effusion or pneumothorax. IMPRESSION: Cardiomegaly. Interstitial opacities bilaterally may represent pulmonary edema. Electronically Signed   By: Lovey Newcomer M.D.   On: 02/20/2016 17:06   Ct Head Wo Contrast  Result Date: 02/20/2016 CLINICAL DATA:  Patient with confusion, headache and dizziness. Generalized weakness. EXAM: CT HEAD WITHOUT CONTRAST TECHNIQUE: Contiguous axial images were obtained from the base of the skull through the vertex without intravenous contrast. COMPARISON:  Brain CT 09/14/2014. FINDINGS: Brain: Ventricles and sulci are appropriate for patient's age. Periventricular and subcortical white matter hypodensity compatible with chronic microvascular ischemic changes. Vascular: Unremarkable. Skull: Intact. Sinuses/Orbits: Paranasal sinuses are unremarkable. Mastoid air cells are well aerated. Orbits are unremarkable.  Other: None. IMPRESSION: No acute intracranial process. Atrophy and chronic microvascular ischemic changes. Electronically Signed   By: Lovey Newcomer M.D.   On: 02/20/2016 17:24    EKG: Independently reviewed. Sinus rhythm with low voltage. No ST elevation.  Assessment/Plan: Principal Problem:   Metabolic encephalopathy Active Problems:   Lower urinary tract infectious disease   Chronic kidney disease (CKD), stage IV (severe) (HCC)   Type 2 diabetes with nephropathy (HCC)   Essential hypertension   Neutropenia (HCC)   Thrombocytopenia (HCC)   ESBL (extended spectrum beta-lactamase) producing bacteria infection    This patient was discussed with the ED physician, including pertinent vitals, physical exam findings, labs, and imaging.  We also discussed care given by the ED provider.  #1 metabolic encephalopathy  Observation on telemetry  In 2016 I admitted this patient at the time noted on H&P that the patient had been on ciprofloxacin and presented with episodes of hallucinations while on ciprofloxacin - I do think that much of her current confused state could be attributed to ciprofloxacin.  We'll discontinue the ciprofloxacin and watch for improvement  If no improvement or if patient continues to display these symptoms, may warrant further examination, including possible MRI #2 UTI  Although the patient urinalysis is inconclusive of UTI, will send for culture and start on antibiotics  Last culture that the patient had showed Klebsiella with ESBL - will start on meropenem #3 ESBL #4 thrombocytopenia  No anticoagulants #5 neutropenia  Secondary to chemotherapy #6 CK D stage IV  No evidence of acute on chronic disease  We'll renally dose medications #7 type 2 diabetes  Diet-controlled #8 essential hypertension  Continue home medications  DVT prophylaxis: SCDs Consultants: None Code Status: Full code Family Communication: Daughter in the room  Disposition Plan:  Will likely be able to return home following admission   Truett Mainland, DO [facility] Pager [phone]  If 7PM-7AM, please contact night-coverage [email or web address] Password [email or web address]

## 2016-02-20 NOTE — ED Triage Notes (Addendum)
Per daughter patient has had confusion, headache, dizziness, generalized weakness, and slow to respond. Patient alert and oriented at this time. Daughter states that increased confusion and dizziness started last night at 9pm. Per family being treated with cipro for UTI since Tuesday. Patient has been c/o headache since Monday that is progressively getting worse. Patient receiving immune therapy for liver and kidney cancer.

## 2016-02-20 NOTE — ED Provider Notes (Signed)
[place] DEPT Provider Note   CSN: [id number] Arrival date & time: 02/20/16  1445   History   Chief Complaint Chief Complaint  Patient presents with  . Altered Mental Status    HPI Joann Hunter is a 83 y.o. female.  HPI Pt has history of a urogenital tumor as well as the liver.  She is receiving immune therapy.  She saw her doctor this past week and was diagnosed with a UTI.  Daughter states she was started on cipro.  According the records, cipro causes hallucinations, however daughter thinks it is the uti.  Daughter states she was up all last night talking about crazy stuff.  Pt thought it was during the day, she was trying to cook food in the middle of the night.  She wanted her daughter to take her out in the car.   No fevers or vomiting. She does have a headache.   Past Medical History:  Diagnosis Date  . Arthritis   . Back pain, chronic   . CAD (coronary artery disease)    NSTEMI 04/2011 with newly diagnosed 3V CAD s/p PCI/DES mLAD 04/11/11 with residual dz (per Dr. Burt Knack, should have consideration of PCI of the occluded RCA based on symptoms and/or consideration of outpatient nuclear perfusion testing after the patient recovers from her infarct)  . Cancer ([place])    kidney ( Nov.2017)  . Chronic kidney disease (CKD), stage IV (severe) ([place]) 12/13/2013  . Hyperglycemia   . Hyperlipidemia   . Hypertension   . Hypothyroidism   . Ischemic cardiomyopathy    EF 45% by cath 04/20/11, 50-55% by echo 04/22/11. hypotension limiting medication titration   . Myocardial infarction 2013  . Type 2 diabetes with nephropathy ([place]) 07/26/2014  . UTI (urinary tract infection) 12/13/2013    Patient Active Problem List   Diagnosis Date Noted  . Goals of care, counseling/discussion 02/02/2016  . Urothelial cancer ([place]) 01/01/2016  . Rectal bleeding 11/05/2014  . Absolute anemia 11/05/2014  . Hypothyroidism 11/05/2014  . Metabolic encephalopathy [id number]  . Type 2 diabetes with  nephropathy ([place]) 07/26/2014  . Essential hypertension 07/26/2014  . Hyponatremia 07/25/2014  . Vertigo 07/25/2014  . AKI (acute kidney injury) ([place]) 12/13/2013  . Fall 12/13/2013  . UTI (lower urinary tract infection) 12/13/2013  . Chronic kidney disease (CKD), stage IV (severe) ([place]) 12/13/2013  . Chest pain at rest 04/26/2011  . Syncope, near 04/26/2011  . CAD (coronary artery disease) 04/23/2011  . Cardiomyopathy, ischemic 04/23/2011  . Dyslipidemia 04/23/2011  . Myocardial infarction, anterior wall, initial care ([place]) 04/20/2011    Past Surgical History:  Procedure Laterality Date  . BACK SURGERY    . BREAST LUMPECTOMY     right  . CORONARY STENT PLACEMENT    . LEFT HEART CATHETERIZATION WITH CORONARY ANGIOGRAM N/A 04/20/2011   Procedure: LEFT HEART CATHETERIZATION WITH CORONARY ANGIOGRAM;  Surgeon: Burnell Blanks, MD;  Location: [facility] C[facility] LAB;  Service: Cardiovascular;  Laterality: N/A;  . PERCUTANEOUS CORONARY STENT INTERVENTION (PCI-S) N/A 04/21/2011   Procedure: PERCUTANEOUS CORONARY STENT INTERVENTION (PCI-S);  Surgeon: Sherren Mocha, MD;  Location: [facility] CATH LAB;  Service: Cardiovascular;  Laterality: N/A;    OB History    Gravida Para Term Preterm AB Living   2 2 2     2    SAB TAB Ectopic Multiple Live Births                   Home Medications    Prior to  Admission medications   Medication Sig Start Date End Date Taking? Authorizing Provider  amLODipine (NORVASC) 10 MG tablet Take 0.5 tablets (5 mg total) by mouth daily. 09/18/14  Yes Samuella Cota, MD  aspirin 81 MG chewable tablet Chew 81 mg by mouth daily.    Yes Historical Provider, MD  Atezolizumab (TECENTRIQ IV) Inject into the vein every 21 ( twenty-one) days.   Yes Historical Provider, MD  docusate sodium (COLACE) 100 MG capsule Take 1 capsule (100 mg total) by mouth 2 (two) times daily. 11/06/14  Yes Kathie Dike, MD  fish oil-omega-3 fatty acids 1000 MG capsule Take 1 g by mouth daily.   Yes  Historical Provider, MD  ibuprofen (ADVIL,MOTRIN) 200 MG tablet Take 200 mg by mouth every 6 (six) hours as needed for moderate pain.   Yes Historical Provider, MD  levothyroxine (SYNTHROID, LEVOTHROID) 112 MCG tablet Take 1 tablet (112 mcg total) by mouth every morning. 12/16/13  Yes Verlee Monte, MD  meclizine (ANTIVERT) 25 MG tablet Take 25 mg by mouth 4 (four) times daily as needed. Dizziness   Yes Historical Provider, MD  metoprolol tartrate (LOPRESSOR) 25 MG tablet Take 12.5 mg by mouth 2 (two) times daily.  04/23/11  Yes Dayna N Dunn, PA-C  nitrofurantoin, macrocrystal-monohydrate, (MACROBID) 100 MG capsule Take 1 capsule (100 mg total) by mouth 2 (two) times daily. 07/17/15  Yes Nat Christen, MD  nitroGLYCERIN (NITROSTAT) 0.4 MG SL tablet Place 0.4 mg under the tongue every 5 (five) minutes x 3 doses as needed. For chest pain. 04/23/11  Yes Dayna N Dunn, PA-C  pantoprazole (PROTONIX) 40 MG tablet Take 1 tablet (40 mg total) by mouth daily. 11/06/14  Yes Kathie Dike, MD  traMADol (ULTRAM) 50 MG tablet Take 50 mg by mouth 3 (three) times daily.  04/26/14  Yes Historical Provider, MD  Vitamin D, Ergocalciferol, (DRISDOL) 50000 UNITS CAPS Take 50,000 Units by mouth 2 (two) times a week. Tuesdays and Thurdays 05/30/12  Yes Historical Provider, MD  ondansetron (ZOFRAN) 8 MG tablet Take 1 tablet (8 mg total) by mouth every 8 (eight) hours as needed for nausea or vomiting. Patient not taking: Reported on 02/20/2016 01/29/16   Patrici Ranks, MD  prochlorperazine (COMPAZINE) 10 MG tablet Take 1 tablet (10 mg total) by mouth every 6 (six) hours as needed for nausea or vomiting. Patient not taking: Reported on 02/20/2016 01/29/16   Patrici Ranks, MD    Family History Family History  Problem Relation Age of Onset  . Other Mother   . Cancer Father   . Other      no known family cardiac disease  . Other Brother     Diptheria  . Depression Sister   . Pneumonia Sister   . Other Brother     murdered     Social History Social History  Substance Use Topics  . Smoking status: Never Smoker  . Smokeless tobacco: Never Used  . Alcohol use No     Allergies   Ciprofloxacin; Lipitor [atorvastatin]; and Penicillins   Review of Systems Review of Systems  All other systems reviewed and are negative.    Physical Exam Updated Vital Signs BP 135/77   Pulse 69   Temp 98.3 F (36.8 C) (Oral)   Resp 17   Ht 5\' 3"  (1.6 m)   Wt 70.3 kg   SpO2 96%   BMI 27.46 kg/m   Physical Exam  Constitutional: No distress.  Elderly, frail  HENT:  Head:  Normocephalic and atraumatic.  Right Ear: External ear normal.  Left Ear: External ear normal.  Mouth/Throat: No oropharyngeal exudate.  Eyes: Conjunctivae are normal. Right eye exhibits no discharge. Left eye exhibits no discharge. No scleral icterus.  Neck: Neck supple. No tracheal deviation present.  Cardiovascular: Normal rate, regular rhythm and intact distal pulses.   Pulmonary/Chest: Effort normal and breath sounds normal. No stridor. No respiratory distress. She has no wheezes. She has no rales.  Abdominal: Soft. Bowel sounds are normal. She exhibits no distension. There is no tenderness. There is no rebound and no guarding.  Musculoskeletal: She exhibits no edema or tenderness.  Neurological: She is alert. She has normal strength. No cranial nerve deficit (no facial droop, extraocular movements intact, no slurred speech) or sensory deficit. She exhibits normal muscle tone. She displays no seizure activity. Coordination normal.  Pt answers questions appropriately, but then seems to be confused and tangential, starts repeating herself and some words are non sensical  Skin: Skin is warm and dry. No rash noted.  Psychiatric: She has a normal mood and affect.  Nursing note and vitals reviewed.    ED Treatments / Results  Labs (all labs ordered are listed, but only abnormal results are displayed) Labs Reviewed  COMPREHENSIVE METABOLIC  PANEL - Abnormal; Notable for the following:       Result Value   Glucose, Bld 162 (*)    BUN 28 (*)    Creatinine, Ser 1.70 (*)    Calcium 8.8 (*)    Total Protein 6.0 (*)    Albumin 2.9 (*)    AST 43 (*)    Total Bilirubin 1.4 (*)    GFR calc non Af Amer 27 (*)    GFR calc Af Amer 31 (*)    All other components within normal limits  CBC - Abnormal; Notable for the following:    WBC 2.0 (*)    RBC 3.60 (*)    Hemoglobin 11.2 (*)    HCT 32.5 (*)    Platelets 42 (*)    All other components within normal limits  URINALYSIS, ROUTINE W REFLEX MICROSCOPIC - Abnormal; Notable for the following:    Hgb urine dipstick LARGE (*)    Ketones, ur 5 (*)    Protein, ur 30 (*)    Bacteria, UA RARE (*)    All other components within normal limits  CBG MONITORING, ED - Abnormal; Notable for the following:    Glucose-Capillary 147 (*)    All other components within normal limits    EKG  EKG Interpretation  Date/Time:  Saturday February 20 2016 16:22:35 EST Ventricular Rate:  68 PR Interval:    QRS Duration: 89 QT Interval:  416 QTC Calculation: 443 R Axis:   17 Text Interpretation:  Sinus rhythm Low voltage, precordial leads Minimal ST depression, diffuse leads No significant change since last tracing Confirmed by Jerol Rufener  MD-J, Sheva Mcdougle [id number]) on 02/20/2016 4:33:25 PM       Radiology Dg Chest 2 View  Result Date: 02/20/2016 CLINICAL DATA:  Patient with confusion. Speech problems. Headache, dizziness and weakness. EXAM: CHEST  2 VIEW COMPARISON:  Chest radiograph 09/14/2014. FINDINGS: Patient is rotated to the left. Cardiomegaly. Aortic atherosclerosis. Monitoring leads overlie the patient. Bilateral interstitial pulmonary opacities. No large area pulmonary consolidation. Thoracic spine degenerative changes. No pleural effusion or pneumothorax. IMPRESSION: Cardiomegaly. Interstitial opacities bilaterally may represent pulmonary edema. Electronically Signed   By: Lovey Newcomer M.D.   On:  02/20/2016 17:06  Ct Head Wo Contrast  Result Date: 02/20/2016 CLINICAL DATA:  Patient with confusion, headache and dizziness. Generalized weakness. EXAM: CT HEAD WITHOUT CONTRAST TECHNIQUE: Contiguous axial images were obtained from the base of the skull through the vertex without intravenous contrast. COMPARISON:  Brain CT 09/14/2014. FINDINGS: Brain: Ventricles and sulci are appropriate for patient's age. Periventricular and subcortical white matter hypodensity compatible with chronic microvascular ischemic changes. Vascular: Unremarkable. Skull: Intact. Sinuses/Orbits: Paranasal sinuses are unremarkable. Mastoid air cells are well aerated. Orbits are unremarkable. Other: None. IMPRESSION: No acute intracranial process. Atrophy and chronic microvascular ischemic changes. Electronically Signed   By: Lovey Newcomer M.D.   On: 02/20/2016 17:24    Procedures Procedures (including critical care time)  Medications Ordered in ED Medications  sodium chloride 0.9 % bolus 500 mL (0 mLs Intravenous Stopped 02/20/16 1805)     Initial Impression / Assessment and Plan / ED Course  I have reviewed the triage vital signs and the nursing notes.  Pertinent labs & imaging results that were available during my care of the patient were reviewed by me and considered in my medical decision making (see chart for details).   Labs are notable for a stable anemia, chronic low protein levels, improving renal insufficiency.  Hematuria noted but doubt infection.  Consistent with her known malignancy  Confusion may be related to the cipro although family is not specifically aware of cipro causing her confusion in the past.  Occult CVA is a possibility with her speech disturbance.   Will consult with medical service for admission, possible mri in am  Final Clinical Impressions(s) / ED Diagnoses   Final diagnoses:  Altered mental status, unspecified altered mental status type  Speech disturbance, unspecified type     New Prescriptions New Prescriptions   No medications on file     Dorie Rank, MD 02/20/16 [phone]

## 2016-02-21 DIAGNOSIS — I129 Hypertensive chronic kidney disease with stage 1 through stage 4 chronic kidney disease, or unspecified chronic kidney disease: Secondary | ICD-10-CM | POA: Diagnosis present

## 2016-02-21 DIAGNOSIS — D61810 Antineoplastic chemotherapy induced pancytopenia: Secondary | ICD-10-CM | POA: Diagnosis present

## 2016-02-21 DIAGNOSIS — I10 Essential (primary) hypertension: Secondary | ICD-10-CM | POA: Diagnosis not present

## 2016-02-21 DIAGNOSIS — E1122 Type 2 diabetes mellitus with diabetic chronic kidney disease: Secondary | ICD-10-CM | POA: Diagnosis present

## 2016-02-21 DIAGNOSIS — D696 Thrombocytopenia, unspecified: Secondary | ICD-10-CM | POA: Diagnosis not present

## 2016-02-21 DIAGNOSIS — Z7982 Long term (current) use of aspirin: Secondary | ICD-10-CM | POA: Diagnosis not present

## 2016-02-21 DIAGNOSIS — I252 Old myocardial infarction: Secondary | ICD-10-CM | POA: Diagnosis not present

## 2016-02-21 DIAGNOSIS — E1121 Type 2 diabetes mellitus with diabetic nephropathy: Secondary | ICD-10-CM | POA: Diagnosis present

## 2016-02-21 DIAGNOSIS — C679 Malignant neoplasm of bladder, unspecified: Secondary | ICD-10-CM | POA: Diagnosis present

## 2016-02-21 DIAGNOSIS — R4701 Aphasia: Secondary | ICD-10-CM | POA: Diagnosis present

## 2016-02-21 DIAGNOSIS — N184 Chronic kidney disease, stage 4 (severe): Secondary | ICD-10-CM | POA: Diagnosis present

## 2016-02-21 DIAGNOSIS — D701 Agranulocytosis secondary to cancer chemotherapy: Secondary | ICD-10-CM

## 2016-02-21 DIAGNOSIS — Z794 Long term (current) use of insulin: Secondary | ICD-10-CM | POA: Diagnosis not present

## 2016-02-21 DIAGNOSIS — R410 Disorientation, unspecified: Secondary | ICD-10-CM | POA: Diagnosis present

## 2016-02-21 DIAGNOSIS — C787 Secondary malignant neoplasm of liver and intrahepatic bile duct: Secondary | ICD-10-CM | POA: Diagnosis present

## 2016-02-21 DIAGNOSIS — G9341 Metabolic encephalopathy: Principal | ICD-10-CM

## 2016-02-21 DIAGNOSIS — I255 Ischemic cardiomyopathy: Secondary | ICD-10-CM | POA: Diagnosis present

## 2016-02-21 DIAGNOSIS — Z79899 Other long term (current) drug therapy: Secondary | ICD-10-CM | POA: Diagnosis not present

## 2016-02-21 DIAGNOSIS — Z955 Presence of coronary angioplasty implant and graft: Secondary | ICD-10-CM | POA: Diagnosis not present

## 2016-02-21 DIAGNOSIS — R4182 Altered mental status, unspecified: Secondary | ICD-10-CM | POA: Diagnosis present

## 2016-02-21 DIAGNOSIS — E039 Hypothyroidism, unspecified: Secondary | ICD-10-CM | POA: Diagnosis present

## 2016-02-21 DIAGNOSIS — T451X5A Adverse effect of antineoplastic and immunosuppressive drugs, initial encounter: Secondary | ICD-10-CM | POA: Diagnosis present

## 2016-02-21 DIAGNOSIS — I2510 Atherosclerotic heart disease of native coronary artery without angina pectoris: Secondary | ICD-10-CM | POA: Diagnosis present

## 2016-02-21 LAB — GLUCOSE, CAPILLARY
GLUCOSE-CAPILLARY: 146 mg/dL — AB (ref 65–99)
GLUCOSE-CAPILLARY: 194 mg/dL — AB (ref 65–99)
GLUCOSE-CAPILLARY: 144 mg/dL — AB (ref 65–99)
Glucose-Capillary: 141 mg/dL — ABNORMAL HIGH (ref 65–99)

## 2016-02-21 LAB — CBC
HEMATOCRIT: 31.3 % — AB (ref 36.0–46.0)
HEMOGLOBIN: 11 g/dL — AB (ref 12.0–15.0)
MCH: 31.4 pg (ref 26.0–34.0)
MCHC: 35.1 g/dL (ref 30.0–36.0)
MCV: 89.4 fL (ref 78.0–100.0)
Platelets: 41 10*3/uL — ABNORMAL LOW (ref 150–400)
RBC: 3.5 MIL/uL — ABNORMAL LOW (ref 3.87–5.11)
RDW: 13.9 % (ref 11.5–15.5)
WBC: 2.3 10*3/uL — AB (ref 4.0–10.5)

## 2016-02-21 LAB — BASIC METABOLIC PANEL
ANION GAP: 8 (ref 5–15)
BUN: 21 mg/dL — ABNORMAL HIGH (ref 6–20)
CALCIUM: 8.5 mg/dL — AB (ref 8.9–10.3)
CO2: 23 mmol/L (ref 22–32)
Chloride: 105 mmol/L (ref 101–111)
Creatinine, Ser: 1.5 mg/dL — ABNORMAL HIGH (ref 0.44–1.00)
GFR calc Af Amer: 36 mL/min — ABNORMAL LOW (ref >60)
GFR, EST NON AFRICAN AMERICAN: 31 mL/min — AB (ref >60)
Glucose, Bld: 156 mg/dL — ABNORMAL HIGH (ref 65–99)
POTASSIUM: 3.3 mmol/L — AB (ref 3.5–5.1)
SODIUM: 136 mmol/L (ref 135–145)

## 2016-02-21 NOTE — Progress Notes (Signed)
PROGRESS NOTE    Joann Hunter  [id number] DOB: [birth year] DOA: 02/20/2016 PCP: Monico Blitz, MD    Brief Narrative:  83 year old female with multiple medical problems on chemotherapy for metastatic urothelial cancer, had seen her primary care physician on Thursday due to malaise and diminished appetite. She is felt to have a possible urinary tract infection and was given ciprofloxacin. She subsequently became confused and was "talking out of her head". She was brought to the ER for evaluation. It was felt that her encephalopathy may be related to fluoroquinolones versus urinary tract infection. She was also noted to be pancytopenic from recent chemotherapy. Currently on antibiotics. Follow-up urine culture. Anticipate discharge home in the next 24-48 hours.   Assessment & Plan:   Principal Problem:   Metabolic encephalopathy Active Problems:   Lower urinary tract infectious disease   Chronic kidney disease (CKD), stage IV (severe) (HCC)   Type 2 diabetes with nephropathy (HCC)   Essential hypertension   Neutropenia (HCC)   Thrombocytopenia (HCC)   ESBL (extended spectrum beta-lactamase) producing bacteria infection   1. Acute encephalopathy. Felt to be related to fluoroquinolone received prior to admission. Also has urinary tract infection. Clinically, appears to be improving. Continue to follow.  2. Urinary tract infection. Previous culture showed Klebsiella with ESBL. Currently on meropenem. Follow-up urine culture.  3. Pancytopenia related to chemotherapy. Continue to follow CBC.  4. CTD stage IV. Creatinine appears to be near baseline. Continue to monitor.  5. Hypertension. Stable. Continue home medications.  6. Diabetes. Diet controlled. Continue sliding scale insulin.   DVT prophylaxis: SCDs Code Status: Full code Family Communication: No family present Disposition Plan: Discharge home once improved, possibly in a.m . physical therapy evaluation will be  requested.   Consultants:     Procedures:     Antimicrobials:  Meropenem 2/17>>  Subjective: Does not know she is in the hospital. Overall she is feeling better.  Objective: Vitals:   02/20/16 2039 02/20/16 2140 02/21/16 0500 02/21/16 1234  BP: 179/78 (!) 192/74 129/61 130/65  Pulse: 83 84 92 73  Resp: 17 20 20 20   Temp:  99.8 F (37.7 C) 98.5 F (36.9 C) 98.4 F (36.9 C)  TempSrc:  Oral Oral Oral  SpO2: 97% 97% 97% 96%  Weight:  70.5 kg (155 lb 6.8 oz)    Height:  5\' 2"  (1.575 m)      Intake/Output Summary (Last 24 hours) at 02/21/16 1530 Last data filed at 02/21/16 1200  Gross per 24 hour  Intake              240 ml  Output                0 ml  Net              240 ml   Filed Weights   02/20/16 1455 02/20/16 2140  Weight: 70.3 kg (155 lb) 70.5 kg (155 lb 6.8 oz)    Examination:  General exam: Appears calm and comfortable  Respiratory system: Clear to auscultation. Respiratory effort normal. Cardiovascular system: S1 & S2 heard, RRR. No JVD, murmurs, rubs, gallops or clicks. No pedal edema. Gastrointestinal system: Abdomen is nondistended, soft and nontender. No organomegaly or masses felt. Normal bowel sounds heard. Central nervous system: No focal neurological deficits. Extremities: Symmetric 5 x 5 power. Skin: No rashes, lesions or ulcers Psychiatry: appears pleasant. Still a little confused.     Data Reviewed: I have personally reviewed following labs and imaging studies  CBC:  Recent Labs Lab 02/20/16 1558 02/21/16 0633  WBC 2.0* 2.3*  HGB 11.2* 11.0*  HCT 32.5* 31.3*  MCV 90.3 89.4  PLT 42* 41*   Basic Metabolic Panel:  Recent Labs Lab 02/20/16 1558 02/21/16 0633  NA 136 136  K 3.9 3.3*  CL 103 105  CO2 25 23  GLUCOSE 162* 156*  BUN 28* 21*  CREATININE 1.70* 1.50*  CALCIUM 8.8* 8.5*   GFR: Estimated Creatinine Clearance: 27.1 mL/min (by C-G formula based on SCr of 1.5 mg/dL (H)). Liver Function Tests:  Recent Labs Lab  02/20/16 1558  AST 43*  ALT 31  ALKPHOS 75  BILITOT 1.4*  PROT 6.0*  ALBUMIN 2.9*   No results for input(s): LIPASE, AMYLASE in the last 168 hours. No results for input(s): AMMONIA in the last 168 hours. Coagulation Profile: No results for input(s): INR, PROTIME in the last 168 hours. Cardiac Enzymes: No results for input(s): CKTOTAL, CKMB, CKMBINDEX, TROPONINI in the last 168 hours. BNP (last 3 results) No results for input(s): PROBNP in the last 8760 hours. HbA1C: No results for input(s): HGBA1C in the last 72 hours. CBG:  Recent Labs Lab 02/20/16 1626 02/20/16 2233 02/21/16 0759 02/21/16 1137  GLUCAP 147* 122* 146* 141*   Lipid Profile: No results for input(s): CHOL, HDL, LDLCALC, TRIG, CHOLHDL, LDLDIRECT in the last 72 hours. Thyroid Function Tests: No results for input(s): TSH, T4TOTAL, FREET4, T3FREE, THYROIDAB in the last 72 hours. Anemia Panel: No results for input(s): VITAMINB12, FOLATE, FERRITIN, TIBC, IRON, RETICCTPCT in the last 72 hours. Sepsis Labs: No results for input(s): PROCALCITON, LATICACIDVEN in the last 168 hours.  No results found for this or any previous visit (from the past 240 hour(s)).       Radiology Studies: Dg Chest 2 View  Result Date: 02/20/2016 CLINICAL DATA:  Patient with confusion. Speech problems. Headache, dizziness and weakness. EXAM: CHEST  2 VIEW COMPARISON:  Chest radiograph 09/14/2014. FINDINGS: Patient is rotated to the left. Cardiomegaly. Aortic atherosclerosis. Monitoring leads overlie the patient. Bilateral interstitial pulmonary opacities. No large area pulmonary consolidation. Thoracic spine degenerative changes. No pleural effusion or pneumothorax. IMPRESSION: Cardiomegaly. Interstitial opacities bilaterally may represent pulmonary edema. Electronically Signed   By: Lovey Newcomer M.D.   On: 02/20/2016 17:06   Ct Head Wo Contrast  Result Date: 02/20/2016 CLINICAL DATA:  Patient with confusion, headache and dizziness.  Generalized weakness. EXAM: CT HEAD WITHOUT CONTRAST TECHNIQUE: Contiguous axial images were obtained from the base of the skull through the vertex without intravenous contrast. COMPARISON:  Brain CT 09/14/2014. FINDINGS: Brain: Ventricles and sulci are appropriate for patient's age. Periventricular and subcortical white matter hypodensity compatible with chronic microvascular ischemic changes. Vascular: Unremarkable. Skull: Intact. Sinuses/Orbits: Paranasal sinuses are unremarkable. Mastoid air cells are well aerated. Orbits are unremarkable. Other: None. IMPRESSION: No acute intracranial process. Atrophy and chronic microvascular ischemic changes. Electronically Signed   By: Lovey Newcomer M.D.   On: 02/20/2016 17:24        Scheduled Meds: . amLODipine  5 mg Oral Daily  . aspirin  81 mg Oral Daily  . docusate sodium  100 mg Oral BID  . insulin aspart  0-15 Units Subcutaneous TID WC  . insulin aspart  0-5 Units Subcutaneous QHS  . levothyroxine  112 mcg Oral QAC breakfast  . meropenem (MERREM) IV  500 mg Intravenous Q12H  . metoprolol tartrate  12.5 mg Oral BID  . pantoprazole  40 mg Oral Daily  . traMADol  50  mg Oral TID   Continuous Infusions:   LOS: 0 days    Time spent: 57mins    Sarai January, MD [facility] Pager [phone]  If 7PM-7AM, please contact night-coverage [email or web address] Password [email or web address] 02/21/2016, 3:30 PM

## 2016-02-22 ENCOUNTER — Telehealth (HOSPITAL_COMMUNITY): Payer: Self-pay

## 2016-02-22 LAB — GLUCOSE, CAPILLARY
Glucose-Capillary: 119 mg/dL — ABNORMAL HIGH (ref 65–99)
Glucose-Capillary: 149 mg/dL — ABNORMAL HIGH (ref 65–99)

## 2016-02-22 LAB — URINE CULTURE: CULTURE: NO GROWTH

## 2016-02-22 LAB — BASIC METABOLIC PANEL
Anion gap: 7 (ref 5–15)
BUN: 25 mg/dL — AB (ref 6–20)
CO2: 23 mmol/L (ref 22–32)
CREATININE: 1.6 mg/dL — AB (ref 0.44–1.00)
Calcium: 8.5 mg/dL — ABNORMAL LOW (ref 8.9–10.3)
Chloride: 105 mmol/L (ref 101–111)
GFR calc Af Amer: 34 mL/min — ABNORMAL LOW (ref >60)
GFR, EST NON AFRICAN AMERICAN: 29 mL/min — AB (ref >60)
GLUCOSE: 140 mg/dL — AB (ref 65–99)
Potassium: 3.6 mmol/L (ref 3.5–5.1)
Sodium: 135 mmol/L (ref 135–145)

## 2016-02-22 LAB — CBC
HEMATOCRIT: 29.9 % — AB (ref 36.0–46.0)
Hemoglobin: 10.5 g/dL — ABNORMAL LOW (ref 12.0–15.0)
MCH: 31.5 pg (ref 26.0–34.0)
MCHC: 35.1 g/dL (ref 30.0–36.0)
MCV: 89.8 fL (ref 78.0–100.0)
PLATELETS: 40 10*3/uL — AB (ref 150–400)
RBC: 3.33 MIL/uL — ABNORMAL LOW (ref 3.87–5.11)
RDW: 14.2 % (ref 11.5–15.5)
WBC: 1.8 10*3/uL — AB (ref 4.0–10.5)

## 2016-02-22 NOTE — Progress Notes (Signed)
Patient's two IVs removed.  Sites WNL.  AVS reviewed with patient and patient's daughter.  Verbalized understanding of discharge instructions, physician follow-up, medications.

## 2016-02-22 NOTE — Evaluation (Signed)
Physical Therapy Evaluation Patient Details Name: Joann Hunter MRN: [id number] DOB: 1932-01-21 Today's Date: 02/22/2016   History of Present Illness  Joann Hunter is a 83 y.o. female with a history of coronary artery disease with history of non-ST elevation MI in 2013, stage IV chronic kidney disease, hypothyroidism, hypertension, type 2 diabetes diet-controlled, urothelial cancer with metastases to the liver, currently undergoing chemotherapy. Patient has had frequent UTIs in the past and has been hospitalized a couple times for these. She was seen by her primary care physician on Thursday due to some malaise and diminished appetite. She was diagnosed with a urinary tract infection was prescribed ciprofloxacin. She took her first dose Thursday night. During the day Friday, she began to be confused and began to "talk out of her mind". Her daughter states that the patient was up all last night talking and thinking it was daytime. The patient was trying to cook food in the middle the night wanted her daughter to take her on the car. She denies fevers, chills, nausea, vomiting.  Clinical Impression  Pt oriented with therapist.  States that she normally uses no assistive device at home but has a rolling walker due To a previous Lt hip injury.  Pt lives with daughter who works therefore she is alone during the daytime hours.  Her Husband is currently in a SNF.  Ms. Harl Bowie will benefit from skilled therapy to progress back to previous state of ambulating without an assistive device.  She will do well with HH.     Follow Up Recommendations Home health PT    Equipment Recommendations  None recommended by PT    Recommendations for Other Services       Precautions / Restrictions Precautions Precautions: None Restrictions Weight Bearing Restrictions: No      Mobility  Bed Mobility Overal bed mobility: Independent                Transfers Overall transfer level: Modified  independent                  Ambulation/Gait Ambulation/Gait assistance: Modified independent (Device/Increase time) Ambulation Distance (Feet): 85 Feet Assistive device: Rolling walker (2 wheeled) Gait Pattern/deviations: WFL(Within Functional Limits)   Gait velocity interpretation: at or above normal speed for age/gender    Stairs            Wheelchair Mobility    Modified Rankin (Stroke Patients Only)       Balance Overall balance assessment:  (sitting I; standing mod I )                                           Pertinent Vitals/Pain Pain Assessment: 0-10 Pain Score: 3  Pain Location: chronic Lt hip pain  Pain Descriptors / Indicators: Aching Pain Intervention(s): Limited activity within patient's tolerance;Monitored during session    Home Living Family/patient expects to be discharged to:: Private residence Living Arrangements: Children Available Help at Discharge: Family Type of Home: House Home Access: Stairs to enter   Technical brewer of Steps: 2 Home Layout: One level Home Equipment: Environmental consultant - 2 wheels;Cane - single point      Prior Function Level of Independence: Independent               Hand Dominance        Extremity/Trunk Assessment        Lower  Extremity Assessment Lower Extremity Assessment: Overall WFL for tasks assessed       Communication   Communication: No difficulties  Cognition Arousal/Alertness: Awake/alert Behavior During Therapy: WFL for tasks assessed/performed Overall Cognitive Status: Within Functional Limits for tasks assessed                         Exercises General Exercises - Lower Extremity Heel Slides:  (bridging x 10 ) Hip ABduction/ADduction: Both;10 reps Straight Leg Raises: Both;10 reps   Assessment/Plan    PT Assessment Patient needs continued PT services  PT Problem List Decreased activity tolerance       PT Treatment Interventions Gait  training;Therapeutic activities;Therapeutic exercise    PT Goals (Current goals can be found in the Care Plan section)  Acute Rehab PT Goals PT Goal Formulation: With patient Time For Goal Achievement: 02/24/16 Potential to Achieve Goals: Good    Frequency Min 3X/week           End of Session Equipment Utilized During Treatment: Gait belt Activity Tolerance: Patient tolerated treatment well Patient left: in bed Nurse Communication: Mobility status PT Visit Diagnosis: Unsteadiness on feet (R26.81)    Functional Assessment Tool Used: AM-PAC 6 Clicks Basic Mobility Functional Limitation: Mobility: Walking and moving around Mobility: Walking and Moving Around Current Status [id number]): At least 20 percent but less than 40 percent impaired, limited or restricted Mobility: Walking and Moving Around Goal Status [phone]): At least 20 percent but less than 40 percent impaired, limited or restricted Mobility: Walking and Moving Around Discharge Status [phone]): At least 20 percent but less than 40 percent impaired, limited or restricted    Time: 0952-1030 PT Time Calculation (min) (ACUTE ONLY): 38 min   Charges:   PT Evaluation $PT Eval Moderate Complexity: 1 Procedure     PT G Codes:   PT G-Codes **NOT FOR INPATIENT CLASS** Functional Assessment Tool Used: AM-PAC 6 Clicks Basic Mobility Functional Limitation: Mobility: Walking and moving around Mobility: Walking and Moving Around Current Status [id number]): At least 20 percent but less than 40 percent impaired, limited or restricted Mobility: Walking and Moving Around Goal Status [phone]): At least 20 percent but less than 40 percent impaired, limited or restricted Mobility: Walking and Moving Around Discharge Status [phone]): At least 20 percent but less than 40 percent impaired, limited or restricted    Rayetta Humphrey, PT CLT [phone] 02/22/2016, 10:39 AM

## 2016-02-22 NOTE — Telephone Encounter (Signed)
-----   Message from Baird Cancer, PA-C sent at 02/22/2016  2:22 PM EST ----- Depends on how she is feeling.  If feeling well, yes.  If not, I would give her a week off.  TK ----- Message ----- From: Darlina Guys, LPN Sent: [id number]  12:41 PM To: Baird Cancer, PA-C  Please advise?  MB ----- Message ----- From: Epifanio Lesches Sent: 02/22/2016  12:27 PM To: Darlina Guys, LPN  Pt is an in patient now and may get out Wednesday her daughter wants to know if she should still come for her tx on Thursday?

## 2016-02-22 NOTE — Discharge Summary (Signed)
Physician Discharge Summary  IMOJEAN MUNDORF [id number] DOB: 05/19/1932 DOA: 02/20/2016  PCP: Monico Blitz, MD  Admit date: 02/20/2016 Discharge date: 02/23/2016  Admitted From:Home  Disposition:  Home  Recommendations for Outpatient Follow-up:  1. Follow up with PCP in 1-2 weeks  Home Health: YES  Equipment/Devices: No   Discharge Condition: Stable CODE STATUS:  Full Diet recommendation: Regular  HPI by Truett Mainland, MD.   Chief Complaint: confusion HPI: Joann Hunter is a 83 y.o. female with a history of coronary artery disease with history of non-ST elevation MI in 2013, stage IV chronic kidney disease, hypothyroidism, hypertension, type 2 diabetes diet-controlled, urothelial cancer with metastases to the liver, currently undergoing chemotherapy. Patient has had frequent UTIs in the past and has been hospitalized a couple times for these. She was seen by her primary care physician on Thursday due to some malaise and diminished appetite. She was diagnosed with a urinary tract infection was prescribed ciprofloxacin. She took her first dose Thursday night. During the day Friday, she began to be confused and began to "talk out of her mind". Her daughter states that the patient was up all last night talking and thinking it was daytime. The patient was trying to cook food in the middle the night wanted her daughter to take her on the car. She denies fevers, chills, nausea, vomiting. Vitals are normal the emergency department, UA not overly indicative of infection. CBC shows neutropenia and thrombocytopenia. CMP shows slightly elevated bilirubin and AST. Creatinine is normal.  In the emergency department, the patient did have an episode of aphasia - the patient began to talk, but none of the words made sense. This was observed by the ER physician.  Discharge Diagnoses:  Principal Problem:   Metabolic encephalopathy Active Problems:   Lower urinary tract infectious  disease   Chronic kidney disease (CKD), stage IV (severe) (HCC)   Type 2 diabetes with nephropathy (HCC)   Essential hypertension   Neutropenia (HCC)   Thrombocytopenia (HCC)   ESBL (extended spectrum beta-lactamase) producing bacteria infection   Confusion  #1 metabolic encephalopathy- likely 2/2 delirium/progressive dementia/sundowning and ciprofloxacin.  - CT head was negative for acute intracranial process and showed atrophy and chronic microvascular ischemic changes, - chest x-ray showed interstitial opacities bilaterally possibly pulmonary edema.  - Documented similar presentation in 2016 while on Cipro, Cipofloxacin was therefore held this admission, by the next day patient showed significant improvement in her mental status,and was back to baseline per her daughter- Joann Hunter.  #2 UTI- UA was negative for nitrites, leukocytes, showed rare bacteria.  - Patient's was started on meropenem, going by previous urine cultures from 09/2015- klebsiella pneumoniae, Extended spectrum beta lactamase resistant.  - Called PCPs office to get results of prior UA, appreciate their help, as I considered that cipro might have affected our UA results here and possibly urine cultures drawn. UA- 02/2013 was negative for nitrites, trace leukocyte esterase.  - Explained to patient's daughter I will be discharging her mother home without antibiotics as I didn't feel strongly that she had a UTI, and options for treatment for her UTI based on prior culture results were intravenous options only. Patient was discharged home without antibiotic treatment.  Final urine cultures resulted 2/20 after discharge- no growth.  #3 Panctopenia- Secondary to chemotherapy.  #4 CK D stage IV- Stable on admission, 1.6 Cr on discharge. No evidence of acute on chronic disease.  #5 type 2 diabetes- Diet-controlled.  #6 essential hypertension. Continue  home medications  Discharge Instructions  Discharge Instructions    Diet - low  sodium heart healthy    Complete by:  As directed    Discharge instructions    Complete by:  As directed    Please follow up with your primary care doctor in 1-2 weeks.   Increase activity slowly    Complete by:  As directed      Allergies as of 02/22/2016      Reactions   Ciprofloxacin Other (See Comments)   Hallucination   Lipitor [atorvastatin] Other (See Comments)   Muscle Weakness   Penicillins Rash   Has patient had a PCN reaction causing immediate rash, facial/tongue/throat swelling, SOB or lightheadedness with hypotension: unknown Has patient had a PCN reaction causing severe rash involving mucus membranes or skin necrosis: unknown Has patient had a PCN reaction that required hospitalization: unknown Has patient had a PCN reaction occurring within the last 10 years: unknown If all of the above answers are "NO", then may proceed with Cephalosporin use. 2      Medication List    STOP taking these medications   nitrofurantoin (macrocrystal-monohydrate) 100 MG capsule Commonly known as:  MACROBID     TAKE these medications   amLODipine 10 MG tablet Commonly known as:  NORVASC Take 0.5 tablets (5 mg total) by mouth daily.   aspirin 81 MG chewable tablet Chew 81 mg by mouth daily.   docusate sodium 100 MG capsule Commonly known as:  COLACE Take 1 capsule (100 mg total) by mouth 2 (two) times daily.   fish oil-omega-3 fatty acids 1000 MG capsule Take 1 g by mouth daily.   ibuprofen 200 MG tablet Commonly known as:  ADVIL,MOTRIN Take 200 mg by mouth every 6 (six) hours as needed for moderate pain.   levothyroxine 112 MCG tablet Commonly known as:  SYNTHROID, LEVOTHROID Take 1 tablet (112 mcg total) by mouth every morning.   meclizine 25 MG tablet Commonly known as:  ANTIVERT Take 25 mg by mouth 4 (four) times daily as needed. Dizziness   metoprolol tartrate 25 MG tablet Commonly known as:  LOPRESSOR Take 12.5 mg by mouth 2 (two) times daily.    nitroGLYCERIN 0.4 MG SL tablet Commonly known as:  NITROSTAT Place 0.4 mg under the tongue every 5 (five) minutes x 3 doses as needed. For chest pain.   ondansetron 8 MG tablet Commonly known as:  ZOFRAN Take 1 tablet (8 mg total) by mouth every 8 (eight) hours as needed for nausea or vomiting.   pantoprazole 40 MG tablet Commonly known as:  PROTONIX Take 1 tablet (40 mg total) by mouth daily.   prochlorperazine 10 MG tablet Commonly known as:  COMPAZINE Take 1 tablet (10 mg total) by mouth every 6 (six) hours as needed for nausea or vomiting.   TECENTRIQ IV Inject into the vein every 21 ( twenty-one) days.   traMADol 50 MG tablet Commonly known as:  ULTRAM Take 50 mg by mouth 3 (three) times daily.   Vitamin D (Ergocalciferol) 50000 units Caps capsule Commonly known as:  DRISDOL Take 50,000 Units by mouth 2 (two) times a week. Tuesdays and Thurdays      Follow-up Information    SHAH,ASHISH, MD Follow up in 1 week(s).   Specialty:  Internal Medicine Why:  Call to schedule hospital follow-up in 1-2 weeks.  Contact information: [place] [id number] [place] Follow up.  Specialty:  Home Health Services Contact information: [place] [id number] [phone]          Allergies  Allergen Reactions  . Ciprofloxacin Other (See Comments)    Hallucination  . Lipitor [Atorvastatin] Other (See Comments)    Muscle Weakness  . Penicillins Rash    Has patient had a PCN reaction causing immediate rash, facial/tongue/throat swelling, SOB or lightheadedness with hypotension: unknown Has patient had a PCN reaction causing severe rash involving mucus membranes or skin necrosis: unknown Has patient had a PCN reaction that required hospitalization: unknown Has patient had a PCN reaction occurring within the last 10 years: unknown If all of the above answers are "NO", then may proceed with Cephalosporin  use. 2    Consultations:  None   Procedures/Studies: Dg Chest 2 View  Result Date: 02/20/2016 CLINICAL DATA:  Patient with confusion. Speech problems. Headache, dizziness and weakness. EXAM: CHEST  2 VIEW COMPARISON:  Chest radiograph 09/14/2014. FINDINGS: Patient is rotated to the left. Cardiomegaly. Aortic atherosclerosis. Monitoring leads overlie the patient. Bilateral interstitial pulmonary opacities. No large area pulmonary consolidation. Thoracic spine degenerative changes. No pleural effusion or pneumothorax. IMPRESSION: Cardiomegaly. Interstitial opacities bilaterally may represent pulmonary edema. Electronically Signed   By: Lovey Newcomer M.D.   On: 02/20/2016 17:06   Ct Head Wo Contrast  Result Date: 02/20/2016 CLINICAL DATA:  Patient with confusion, headache and dizziness. Generalized weakness. EXAM: CT HEAD WITHOUT CONTRAST TECHNIQUE: Contiguous axial images were obtained from the base of the skull through the vertex without intravenous contrast. COMPARISON:  Brain CT 09/14/2014. FINDINGS: Brain: Ventricles and sulci are appropriate for patient's age. Periventricular and subcortical white matter hypodensity compatible with chronic microvascular ischemic changes. Vascular: Unremarkable. Skull: Intact. Sinuses/Orbits: Paranasal sinuses are unremarkable. Mastoid air cells are well aerated. Orbits are unremarkable. Other: None. IMPRESSION: No acute intracranial process. Atrophy and chronic microvascular ischemic changes. Electronically Signed   By: Lovey Newcomer M.D.   On: 02/20/2016 17:24   Subjective: No complaints. Denies pain.   Discharge Exam: Vitals:   02/22/16 0519 02/22/16 0927  BP: (!) 113/47 (!) 149/73  Pulse: 71 83  Resp: 20   Temp: 98.2 F (36.8 C)    Vitals:   02/21/16 1234 02/21/16 2116 02/22/16 0519 02/22/16 0927  BP: 130/65 (!) 124/58 (!) 113/47 (!) 149/73  Pulse: 73 85 71 83  Resp: 20 20 20    Temp: 98.4 F (36.9 C) 99.1 F (37.3 C) 98.2 F (36.8 C)    TempSrc: Oral Oral Oral   SpO2: 96% 97% 96% 96%  Weight:      Height:        General: Pt is alert, awake, not in acute distress Cardiovascular: RRR, S1/S2 +, no rubs, no gallops Respiratory: CTA bilaterally, no wheezing, no rhonchi Abdominal: Soft, NT, ND, bowel sounds + Extremities: no edema, no cyanosis  The results of significant diagnostics from this hospitalization (including imaging, microbiology, ancillary and laboratory) are listed below for reference.    Microbiology: Recent Results (from the past 240 hour(s))  Culture, Urine     Status: None   Collection Time: 02/20/16  6:10 PM  Result Value Ref Range Status   Specimen Description URINE, CATHETERIZED  Final   Special Requests NONE  Final   Culture   Final    NO GROWTH Performed at [facility], [address] [place], [place] [address]    Report Status 02/22/2016 FINAL  Final     Labs:  Basic Metabolic Panel:  Recent Labs Lab 02/20/16 1558 02/21/16 0633 02/22/16 0622  NA 136 136 135  K 3.9 3.3* 3.6  CL 103 105 105  CO2 25 23 23   GLUCOSE 162* 156* 140*  BUN 28* 21* 25*  CREATININE 1.70* 1.50* 1.60*  CALCIUM 8.8* 8.5* 8.5*   Liver Function Tests:  Recent Labs Lab 02/20/16 1558  AST 43*  ALT 31  ALKPHOS 75  BILITOT 1.4*  PROT 6.0*  ALBUMIN 2.9*   CBC:  Recent Labs Lab 02/20/16 1558 02/21/16 0633 02/22/16 0622  WBC 2.0* 2.3* 1.8*  HGB 11.2* 11.0* 10.5*  HCT 32.5* 31.3* 29.9*  MCV 90.3 89.4 89.8  PLT 42* 41* 40*   CBG:  Recent Labs Lab 02/21/16 1137 02/21/16 1626 02/21/16 2102 02/22/16 0821 02/22/16 1203  GLUCAP 141* 194* 144* 119* 149*   Urinalysis    Component Value Date/Time   COLORURINE YELLOW 02/20/2016 1501   APPEARANCEUR CLEAR 02/20/2016 1501   LABSPEC 1.017 02/20/2016 1501   PHURINE 5.0 02/20/2016 1501   GLUCOSEU NEGATIVE 02/20/2016 1501   HGBUR LARGE (A) 02/20/2016 1501   BILIRUBINUR NEGATIVE 02/20/2016 1501   KETONESUR 5 (A) 02/20/2016 1501   PROTEINUR  30 (A) 02/20/2016 1501   UROBILINOGEN 0.2 11/05/2014 2000   NITRITE NEGATIVE 02/20/2016 1501   LEUKOCYTESUR NEGATIVE 02/20/2016 1501   Sepsis Labs Invalid input(s): PROCALCITONIN,  WBC,  LACTICIDVEN Microbiology Recent Results (from the past 240 hour(s))  Culture, Urine     Status: None   Collection Time: 02/20/16  6:10 PM  Result Value Ref Range Status   Specimen Description URINE, CATHETERIZED  Final   Special Requests NONE  Final   Culture   Final    NO GROWTH Performed at [facility], [address] [place], [place] [address]    Report Status 02/22/2016 FINAL  Final     Time coordinating discharge: Over 30 minutes  SIGNED:  Bethena Roys, MD  [facility] 02/23/2016, 6:00 PM Pager [phone].  If 7PM-7AM, please contact night-coverage [email or web address] Password [email or web address]

## 2016-02-22 NOTE — Care Management Note (Signed)
Case Management Note  Patient Details  Name: Joann Hunter MRN: [id number] Date of Birth: 12-05-1932  Subjective/Objective:                  Pt admitted with encephalopathy. She is from home, lives with her daughter. She is ind with ADL's at baseline. Her husband is in nursing home. Pt has PCP, transportation to appointments and no difficulty affording her medications. Her daughter manages her meds. PT has recommended HH PT. Pt is agreeable and has chosen [facility]  Pt is aware HH has 48hrs to initiate services. Tommi Rumps of [place] aware of referral and will obtain pt info from chart. Anticipate DC home in next 24hrs.    Action/Plan: Plan for DC home with [facility] services.   Expected Discharge Date:     02/23/2016             Expected Discharge Plan:  [name]  In-House Referral:  NA  Discharge planning Services  CM Consult  Post Acute Care Choice:  Home Health Choice offered to:  Patient HH Arranged:  RN, PT [facility] Agency:  [place]  Status of Service:  Completed, signed off  Sherald Barge, RN 02/22/2016, 2:11 PM

## 2016-02-22 NOTE — Telephone Encounter (Signed)
Spoke with daughter, cathy. She states patient is getting discharged today. She will see how patient does tomorrow and decide then. She will call if she wants to reschedule.

## 2016-02-24 ENCOUNTER — Other Ambulatory Visit: Payer: Self-pay

## 2016-02-24 ENCOUNTER — Observation Stay (HOSPITAL_COMMUNITY)
Admission: EM | Admit: 2016-02-24 | Discharge: 2016-02-25 | Disposition: A | Discharge status: Home / Self Care | Payer: Medicare Other | Attending: Internal Medicine | Admitting: Internal Medicine

## 2016-02-24 ENCOUNTER — Encounter (HOSPITAL_COMMUNITY): Payer: Self-pay | Admitting: Cardiology

## 2016-02-24 ENCOUNTER — Emergency Department (HOSPITAL_COMMUNITY): Payer: Medicare Other

## 2016-02-24 DIAGNOSIS — I2510 Atherosclerotic heart disease of native coronary artery without angina pectoris: Secondary | ICD-10-CM | POA: Diagnosis not present

## 2016-02-24 DIAGNOSIS — E039 Hypothyroidism, unspecified: Secondary | ICD-10-CM | POA: Diagnosis not present

## 2016-02-24 DIAGNOSIS — Z7982 Long term (current) use of aspirin: Secondary | ICD-10-CM | POA: Diagnosis not present

## 2016-02-24 DIAGNOSIS — G9340 Encephalopathy, unspecified: Secondary | ICD-10-CM | POA: Diagnosis not present

## 2016-02-24 DIAGNOSIS — E1121 Type 2 diabetes mellitus with diabetic nephropathy: Secondary | ICD-10-CM | POA: Diagnosis not present

## 2016-02-24 DIAGNOSIS — I10 Essential (primary) hypertension: Secondary | ICD-10-CM

## 2016-02-24 DIAGNOSIS — R410 Disorientation, unspecified: Secondary | ICD-10-CM | POA: Diagnosis not present

## 2016-02-24 DIAGNOSIS — I951 Orthostatic hypotension: Principal | ICD-10-CM | POA: Insufficient documentation

## 2016-02-24 DIAGNOSIS — I252 Old myocardial infarction: Secondary | ICD-10-CM | POA: Diagnosis not present

## 2016-02-24 DIAGNOSIS — R5383 Other fatigue: Secondary | ICD-10-CM | POA: Diagnosis present

## 2016-02-24 DIAGNOSIS — I129 Hypertensive chronic kidney disease with stage 1 through stage 4 chronic kidney disease, or unspecified chronic kidney disease: Secondary | ICD-10-CM | POA: Insufficient documentation

## 2016-02-24 DIAGNOSIS — Z79899 Other long term (current) drug therapy: Secondary | ICD-10-CM | POA: Diagnosis not present

## 2016-02-24 DIAGNOSIS — N184 Chronic kidney disease, stage 4 (severe): Secondary | ICD-10-CM | POA: Diagnosis not present

## 2016-02-24 DIAGNOSIS — C689 Malignant neoplasm of urinary organ, unspecified: Secondary | ICD-10-CM | POA: Diagnosis present

## 2016-02-24 LAB — LACTIC ACID, PLASMA
LACTIC ACID, VENOUS: 0.9 mmol/L (ref 0.5–1.9)
Lactic Acid, Venous: 0.9 mmol/L (ref 0.5–1.9)

## 2016-02-24 LAB — URINALYSIS, MICROSCOPIC (REFLEX): Squamous Epithelial / LPF: NONE SEEN

## 2016-02-24 LAB — CBC WITH DIFFERENTIAL/PLATELET
BASOS ABS: 0 10*3/uL (ref 0.0–0.1)
Basophils Relative: 0 %
Eosinophils Absolute: 0 10*3/uL (ref 0.0–0.7)
Eosinophils Relative: 0 %
HCT: 27.7 % — ABNORMAL LOW (ref 36.0–46.0)
Hemoglobin: 9.3 g/dL — ABNORMAL LOW (ref 12.0–15.0)
LYMPHS ABS: 1.1 10*3/uL (ref 0.7–4.0)
Lymphocytes Relative: 45 %
MCH: 31 pg (ref 26.0–34.0)
MCHC: 33.6 g/dL (ref 30.0–36.0)
MCV: 92.3 fL (ref 78.0–100.0)
MONOS PCT: 27 %
Monocytes Absolute: 0.6 10*3/uL (ref 0.1–1.0)
NEUTROS PCT: 28 %
Neutro Abs: 0.7 10*3/uL — ABNORMAL LOW (ref 1.7–7.7)
Platelets: 62 10*3/uL — ABNORMAL LOW (ref 150–400)
RBC: 3 MIL/uL — AB (ref 3.87–5.11)
RDW: 14.1 % (ref 11.5–15.5)
WBC: 2.4 10*3/uL — AB (ref 4.0–10.5)

## 2016-02-24 LAB — URINALYSIS, ROUTINE W REFLEX MICROSCOPIC
Bilirubin Urine: NEGATIVE
GLUCOSE, UA: NEGATIVE mg/dL
KETONES UR: NEGATIVE mg/dL
LEUKOCYTES UA: NEGATIVE
Nitrite: NEGATIVE
PH: 6 (ref 5.0–8.0)
Protein, ur: 100 mg/dL — AB
Specific Gravity, Urine: 1.015 (ref 1.005–1.030)

## 2016-02-24 LAB — COMPREHENSIVE METABOLIC PANEL
ALT: 31 U/L (ref 14–54)
AST: 40 U/L (ref 15–41)
Albumin: 2.5 g/dL — ABNORMAL LOW (ref 3.5–5.0)
Alkaline Phosphatase: 80 U/L (ref 38–126)
Anion gap: 5 (ref 5–15)
BILIRUBIN TOTAL: 1.1 mg/dL (ref 0.3–1.2)
BUN: 25 mg/dL — AB (ref 6–20)
CO2: 25 mmol/L (ref 22–32)
CREATININE: 1.72 mg/dL — AB (ref 0.44–1.00)
Calcium: 8.1 mg/dL — ABNORMAL LOW (ref 8.9–10.3)
Chloride: 103 mmol/L (ref 101–111)
GFR, EST AFRICAN AMERICAN: 31 mL/min — AB (ref >60)
GFR, EST NON AFRICAN AMERICAN: 27 mL/min — AB (ref >60)
Glucose, Bld: 144 mg/dL — ABNORMAL HIGH (ref 65–99)
POTASSIUM: 3.5 mmol/L (ref 3.5–5.1)
Sodium: 133 mmol/L — ABNORMAL LOW (ref 135–145)
TOTAL PROTEIN: 5.2 g/dL — AB (ref 6.5–8.1)

## 2016-02-24 LAB — GLUCOSE, CAPILLARY: GLUCOSE-CAPILLARY: 119 mg/dL — AB (ref 65–99)

## 2016-02-24 LAB — TROPONIN I: Troponin I: <0.03 ng/mL (ref <0.03)

## 2016-02-24 MED ORDER — SODIUM CHLORIDE 0.9 % IV SOLN
INTRAVENOUS | Status: DC
  Administered 2016-02-24 – 2016-02-25 (×2): via INTRAVENOUS

## 2016-02-24 MED ORDER — SODIUM CHLORIDE 0.9 % IV SOLN
INTRAVENOUS | Status: DC
  Administered 2016-02-24: 13:00:00 via INTRAVENOUS

## 2016-02-24 MED ORDER — ASPIRIN 81 MG PO CHEW
81.0000 mg | CHEWABLE_TABLET | Freq: Every day | ORAL | Status: DC
  Administered 2016-02-24 – 2016-02-25 (×2): 81 mg via ORAL
  Filled 2016-02-24 (×2): qty 1

## 2016-02-24 MED ORDER — ACETAMINOPHEN 650 MG RE SUPP: 650.0000 mg | Freq: Four times a day (QID) | RECTAL | Status: DC | PRN

## 2016-02-24 MED ORDER — LEVOTHYROXINE SODIUM 112 MCG PO TABS
112.0000 ug | ORAL_TABLET | Freq: Every morning | ORAL | Status: DC
  Administered 2016-02-25: 112 ug via ORAL
  Filled 2016-02-24: qty 1

## 2016-02-24 MED ORDER — OMEGA-3-ACID ETHYL ESTERS 1 G PO CAPS
1.0000 g | ORAL_CAPSULE | Freq: Every day | ORAL | Status: DC
  Administered 2016-02-24 – 2016-02-25 (×2): 1 g via ORAL
  Filled 2016-02-24 (×2): qty 1

## 2016-02-24 MED ORDER — PANTOPRAZOLE SODIUM 40 MG PO TBEC
40.0000 mg | DELAYED_RELEASE_TABLET | Freq: Every day | ORAL | Status: DC
  Administered 2016-02-24 – 2016-02-25 (×2): 40 mg via ORAL
  Filled 2016-02-24 (×2): qty 1

## 2016-02-24 MED ORDER — INSULIN ASPART 100 UNIT/ML ~~LOC~~ SOLN
0.0000 [IU] | Freq: Three times a day (TID) | SUBCUTANEOUS | Status: DC
  Administered 2016-02-25: 3 [IU] via SUBCUTANEOUS
  Administered 2016-02-25: 1 [IU] via SUBCUTANEOUS

## 2016-02-24 MED ORDER — ACETAMINOPHEN 325 MG PO TABS
650.0000 mg | ORAL_TABLET | Freq: Four times a day (QID) | ORAL | Status: DC | PRN
  Administered 2016-02-24: 650 mg via ORAL
  Filled 2016-02-24: qty 2

## 2016-02-24 MED ORDER — INSULIN ASPART 100 UNIT/ML ~~LOC~~ SOLN: 0.0000 [IU] | Freq: Every day | SUBCUTANEOUS | Status: DC

## 2016-02-24 MED ORDER — NITROGLYCERIN 0.4 MG SL SUBL: 0.4000 mg | SUBLINGUAL_TABLET | SUBLINGUAL | Status: DC | PRN

## 2016-02-24 MED ORDER — PROCHLORPERAZINE MALEATE 5 MG PO TABS: 10.0000 mg | ORAL_TABLET | Freq: Four times a day (QID) | ORAL | Status: DC | PRN

## 2016-02-24 MED ORDER — ONDANSETRON HCL 4 MG PO TABS: 8.0000 mg | ORAL_TABLET | Freq: Three times a day (TID) | ORAL | Status: DC | PRN

## 2016-02-24 MED ORDER — SODIUM CHLORIDE 0.9 % IV BOLUS (SEPSIS)
500.0000 mL | Freq: Once | INTRAVENOUS | Status: AC
  Administered 2016-02-24: 500 mL via INTRAVENOUS

## 2016-02-24 MED ORDER — SENNOSIDES-DOCUSATE SODIUM 8.6-50 MG PO TABS: 1.0000 | ORAL_TABLET | Freq: Every evening | ORAL | Status: DC | PRN

## 2016-02-24 MED ORDER — VITAMIN D (ERGOCALCIFEROL) 1.25 MG (50000 UNIT) PO CAPS
50000.0000 [IU] | ORAL_CAPSULE | ORAL | Status: DC
Start: 2016-02-25 — End: 2016-02-25
  Administered 2016-02-25: 50000 [IU] via ORAL
  Filled 2016-02-24: qty 1

## 2016-02-24 MED ORDER — DOCUSATE SODIUM 100 MG PO CAPS
100.0000 mg | ORAL_CAPSULE | Freq: Two times a day (BID) | ORAL | Status: DC
  Administered 2016-02-24 – 2016-02-25 (×2): 100 mg via ORAL
  Filled 2016-02-24 (×2): qty 1

## 2016-02-24 NOTE — ED Notes (Signed)
Given ice water.

## 2016-02-24 NOTE — ED Provider Notes (Signed)
[facility] DEPT Provider Note   CSN: [id number] Arrival date & time: 02/24/16  0919     History   Chief Complaint Chief Complaint  Patient presents with  . Fatigue    HPI Joann Hunter is a 83 y.o. female.    Pt was seen at 0955. Per pt and her family, c/o gradual onset and persistence of constant generalized weakness since being discharged from the hospital 2 days ago for similar complaints. Pt states she feels "dizzy" when she stands up. Denies CP/palpitations, no SOB/cough, no abd pain, no N/V/D, no fevers, no focal motor weakness.   Past Medical History:  Diagnosis Date  . Arthritis   . Back pain, chronic   . CAD (coronary artery disease)    NSTEMI 04/2011 with newly diagnosed 3V CAD s/p PCI/DES mLAD 04/11/11 with residual dz (per Dr. Burt Knack, should have consideration of PCI of the occluded RCA based on symptoms and/or consideration of outpatient nuclear perfusion testing after the patient recovers from her infarct)  . Cancer ([place])    kidney ( Nov.2017)  . Chronic kidney disease (CKD), stage IV (severe) ([place]) 12/13/2013  . Hyperglycemia   . Hyperlipidemia   . Hypertension   . Hypothyroidism   . Ischemic cardiomyopathy    EF 45% by cath 04/20/11, 50-55% by echo 04/22/11. hypotension limiting medication titration   . Myocardial infarction 2013  . Type 2 diabetes with nephropathy ([place]) 07/26/2014  . UTI (urinary tract infection) 12/13/2013    Patient Active Problem List   Diagnosis Date Noted  . Confusion 02/21/2016  . Neutropenia ([place]) 02/20/2016  . Thrombocytopenia ([place]) 02/20/2016  . ESBL (extended spectrum beta-lactamase) producing bacteria infection 02/20/2016  . Goals of care, counseling/discussion 02/02/2016  . Urothelial cancer ([place]) 01/01/2016  . Rectal bleeding 11/05/2014  . Absolute anemia 11/05/2014  . Hypothyroidism 11/05/2014  . Metabolic encephalopathy 99991111  . Type 2 diabetes with nephropathy ([place]) 07/26/2014  . Essential hypertension  07/26/2014  . Hyponatremia 07/25/2014  . Vertigo 07/25/2014  . AKI (acute kidney injury) ([place]) 12/13/2013  . Fall 12/13/2013  . Lower urinary tract infectious disease 12/13/2013  . Chronic kidney disease (CKD), stage IV (severe) ([place]) 12/13/2013  . Chest pain at rest 04/26/2011  . Syncope, near 04/26/2011  . CAD (coronary artery disease) 04/23/2011  . Cardiomyopathy, ischemic 04/23/2011  . Dyslipidemia 04/23/2011  . Myocardial infarction, anterior wall, initial care ([place]) 04/20/2011    Past Surgical History:  Procedure Laterality Date  . BACK SURGERY    . BREAST LUMPECTOMY     right  . CORONARY STENT PLACEMENT    . LEFT HEART CATHETERIZATION WITH CORONARY ANGIOGRAM N/A 04/20/2011   Procedure: LEFT HEART CATHETERIZATION WITH CORONARY ANGIOGRAM;  Surgeon: Burnell Blanks, MD;  Location[facility] C[facility] LAB;  Service: Cardiovascular;  Laterality: N/A;  . PERCUTANEOUS CORONARY STENT INTERVENTION (PCI-S) N/A 04/21/2011   Procedure: PERCUTANEOUS CORONARY STENT INTERVENTION (PCI-S);  Surgeon: Sherren Mocha, MD;  Location: [facility] CATH LAB;  Service: Cardiovascular;  Laterality: N/A;    OB History    Gravida Para Term Preterm AB Living   2 2 2     2    SAB TAB Ectopic Multiple Live Births                   Home Medications    Prior to Admission medications   Medication Sig Start Date End Date Taking? Authorizing Provider  amLODipine (NORVASC) 10 MG tablet Take 0.5 tablets (5 mg total) by mouth daily. 09/18/14  Yes Samuella Cota, MD  aspirin 81 MG chewable tablet Chew 81 mg by mouth daily.    Yes Historical Provider, MD  Atezolizumab (TECENTRIQ IV) Inject into the vein every 21 ( twenty-one) days.   Yes Historical Provider, MD  docusate sodium (COLACE) 100 MG capsule Take 1 capsule (100 mg total) by mouth 2 (two) times daily. 11/06/14  Yes Kathie Dike, MD  fish oil-omega-3 fatty acids 1000 MG capsule Take 1 g by mouth daily.   Yes Historical Provider, MD  ibuprofen (ADVIL,MOTRIN) 200  MG tablet Take 200 mg by mouth every 6 (six) hours as needed for moderate pain.   Yes Historical Provider, MD  levothyroxine (SYNTHROID, LEVOTHROID) 112 MCG tablet Take 1 tablet (112 mcg total) by mouth every morning. 12/16/13  Yes Verlee Monte, MD  meclizine (ANTIVERT) 25 MG tablet Take 25 mg by mouth 4 (four) times daily as needed. Dizziness   Yes Historical Provider, MD  metoprolol tartrate (LOPRESSOR) 25 MG tablet Take 12.5 mg by mouth 2 (two) times daily.  04/23/11  Yes Dayna N Dunn, PA-C  nitroGLYCERIN (NITROSTAT) 0.4 MG SL tablet Place 0.4 mg under the tongue every 5 (five) minutes x 3 doses as needed. For chest pain. 04/23/11  Yes Dayna N Dunn, PA-C  ondansetron (ZOFRAN) 8 MG tablet Take 1 tablet (8 mg total) by mouth every 8 (eight) hours as needed for nausea or vomiting. 01/29/16  Yes Patrici Ranks, MD  pantoprazole (PROTONIX) 40 MG tablet Take 1 tablet (40 mg total) by mouth daily. 11/06/14  Yes Kathie Dike, MD  prochlorperazine (COMPAZINE) 10 MG tablet Take 1 tablet (10 mg total) by mouth every 6 (six) hours as needed for nausea or vomiting. 01/29/16  Yes Patrici Ranks, MD  traMADol (ULTRAM) 50 MG tablet Take 50 mg by mouth 3 (three) times daily.  04/26/14  Yes Historical Provider, MD  Vitamin D, Ergocalciferol, (DRISDOL) 50000 UNITS CAPS Take 50,000 Units by mouth 2 (two) times a week. Tuesdays and Thurdays 05/30/12  Yes Historical Provider, MD    Family History Family History  Problem Relation Age of Onset  . Other Mother   . Cancer Father   . Other      no known family cardiac disease  . Other Brother     Diptheria  . Depression Sister   . Pneumonia Sister   . Other Brother     murdered    Social History Social History  Substance Use Topics  . Smoking status: Never Smoker  . Smokeless tobacco: Never Used  . Alcohol use No     Allergies   Ciprofloxacin; Lipitor [atorvastatin]; and Penicillins   Review of Systems Review of Systems ROS: Statement: All systems  negative except as marked or noted in the HPI; Constitutional: Negative for fever and chills. ; ; Eyes: Negative for eye pain, redness and discharge. ; ; ENMT: Negative for ear pain, hoarseness, nasal congestion, sinus pressure and sore throat. ; ; Cardiovascular: Negative for chest pain, palpitations, diaphoresis, dyspnea and peripheral edema. ; ; Respiratory: Negative for cough, wheezing and stridor. ; ; Gastrointestinal: +nausea. Negative for vomiting, diarrhea, abdominal pain, blood in stool, hematemesis, jaundice and rectal bleeding. . ; ; Genitourinary: Negative for dysuria, flank pain and hematuria. ; ; Musculoskeletal: Negative for back pain and neck pain. Negative for swelling and trauma.; ; Skin: Negative for pruritus, rash, abrasions, blisters, bruising and skin lesion.; ; Neuro: +generalized weakness. Negative for headache and neck stiffness. Negative for altered level of  consciousness, altered mental status, extremity weakness, paresthesias, involuntary movement, seizure and syncope.       Physical Exam Updated Vital Signs BP 145/55   Pulse 64   Temp 98.1 F (36.7 C) (Oral)   Resp 16   Ht 5\' 2"  (1.575 m)   Wt 150 lb (68 kg)   SpO2 100%   BMI 27.44 kg/m   Patient Vitals for the past 24 hrs:  BP Temp Temp src Pulse Resp SpO2 Height Weight  02/24/16 1200 (!) 89/67 - - 72 20 98 % - -  02/24/16 1157 (!) 98/48 - - 75 18 97 % - -  02/24/16 1154 (!) 121/46 - - 68 18 98 % - -  02/24/16 0928 145/55 98.1 F (36.7 C) Oral 64 16 100 % 5\' 2"  (1.575 m) 150 lb (68 kg)     Physical Exam 1000: Physical examination:  Nursing notes reviewed; Vital signs and O2 SAT reviewed;  Constitutional: Well developed, Well nourished, Well hydrated, In no acute distress; Head:  Normocephalic, atraumatic; Eyes: EOMI, PERRL, No scleral icterus; ENMT: Mouth and pharynx normal, Mucous membranes moist; Neck: Supple, Full range of motion, No lymphadenopathy; Cardiovascular: Regular rate and rhythm, No gallop;  Respiratory: Breath sounds clear & equal bilaterally, No wheezes.  Speaking full sentences with ease, Normal respiratory effort/excursion; Chest: Nontender, Movement normal; Abdomen: Soft, Nontender, Nondistended, Normal bowel sounds; Genitourinary: No CVA tenderness; Extremities: Pulses normal, No tenderness, No edema, No calf edema or asymmetry.; Neuro: Awake, alert, vague historian. Major CN grossly intact. No facial droop. Speech clear. No gross focal motor or sensory deficits in extremities.; Skin: Color normal, Warm, Dry.   ED Treatments / Results  Labs (all labs ordered are listed, but only abnormal results are displayed)   EKG  EKG Interpretation  Date/Time:  Wednesday February 24 2016 10:13:58 EST Ventricular Rate:  61 PR Interval:    QRS Duration: 87 QT Interval:  442 QTC Calculation: 446 R Axis:   0 Text Interpretation:  Sinus rhythm Low voltage, extremity and precordial leads Consider anterior infarct Artifact When compared with ECG of 02/20/2016 No significant change was found Confirmed by [facility]  MD, Nunzio Cory [phone]) on 02/24/2016 10:46:55 AM       Radiology   Procedures Procedures (including critical care time)  Medications Ordered in ED Medications - No data to display   Initial Impression / Assessment and Plan / ED Course  I have reviewed the triage vital signs and the nursing notes.  Pertinent labs & imaging results that were available during my care of the patient were reviewed by me and considered in my medical decision making (see chart for details).  MDM Reviewed: previous chart, nursing note and vitals Reviewed previous: labs and ECG Interpretation: labs, ECG, x-ray and CT scan    Results for orders placed or performed during the hospital encounter of 02/24/16  Urinalysis, Routine w reflex microscopic  Result Value Ref Range   Color, Urine YELLOW YELLOW   APPearance CLEAR CLEAR   Specific Gravity, Urine 1.015 1.005 - 1.030   pH 6.0 5.0 - 8.0    Glucose, UA NEGATIVE NEGATIVE mg/dL   Hgb urine dipstick LARGE (A) NEGATIVE   Bilirubin Urine NEGATIVE NEGATIVE   Ketones, ur NEGATIVE NEGATIVE mg/dL   Protein, ur 100 (A) NEGATIVE mg/dL   Nitrite NEGATIVE NEGATIVE   Leukocytes, UA NEGATIVE NEGATIVE  Comprehensive metabolic panel  Result Value Ref Range   Sodium 133 (L) 135 - 145 mmol/L   Potassium 3.5 3.5 -  5.1 mmol/L   Chloride 103 101 - 111 mmol/L   CO2 25 22 - 32 mmol/L   Glucose, Bld 144 (H) 65 - 99 mg/dL   BUN 25 (H) 6 - 20 mg/dL   Creatinine, Ser 1.72 (H) 0.44 - 1.00 mg/dL   Calcium 8.1 (L) 8.9 - 10.3 mg/dL   Total Protein 5.2 (L) 6.5 - 8.1 g/dL   Albumin 2.5 (L) 3.5 - 5.0 g/dL   AST 40 15 - 41 U/L   ALT 31 14 - 54 U/L   Alkaline Phosphatase 80 38 - 126 U/L   Total Bilirubin 1.1 0.3 - 1.2 mg/dL   GFR calc non Af Amer 27 (L) >60 mL/min   GFR calc Af Amer 31 (L) >60 mL/min   Anion gap 5 5 - 15  CBC with Differential  Result Value Ref Range   WBC 2.4 (L) 4.0 - 10.5 K/uL   RBC 3.00 (L) 3.87 - 5.11 MIL/uL   Hemoglobin 9.3 (L) 12.0 - 15.0 g/dL   HCT 27.7 (L) 36.0 - 46.0 %   MCV 92.3 78.0 - 100.0 fL   MCH 31.0 26.0 - 34.0 pg   MCHC 33.6 30.0 - 36.0 g/dL   RDW 14.1 11.5 - 15.5 %   Platelets 62 (L) 150 - 400 K/uL   Neutrophils Relative % 28 %   Lymphocytes Relative 45 %   Monocytes Relative 27 %   Eosinophils Relative 0 %   Basophils Relative 0 %   Neutro Abs 0.7 (L) 1.7 - 7.7 K/uL   Lymphs Abs 1.1 0.7 - 4.0 K/uL   Monocytes Absolute 0.6 0.1 - 1.0 K/uL   Eosinophils Absolute 0.0 0.0 - 0.7 K/uL   Basophils Absolute 0.0 0.0 - 0.1 K/uL   WBC Morphology WHITE COUNT CONFIRMED ON SMEAR    Smear Review PLATELET COUNT CONFIRMED BY SMEAR   Lactic acid, plasma  Result Value Ref Range   Lactic Acid, Venous 0.9 0.5 - 1.9 mmol/L  Troponin I  Result Value Ref Range   Troponin I <0.03 <0.03 ng/mL  Urinalysis, Microscopic (reflex)  Result Value Ref Range   RBC / HPF TOO NUMEROUS TO COUNT 0 - 5 RBC/hpf   WBC, UA 0-5 0 - 5  WBC/hpf   Bacteria, UA FEW (A) NONE SEEN   Squamous Epithelial / LPF NONE SEEN NONE SEEN   Dg Chest 2 View Result Date: 02/24/2016 CLINICAL DATA:  Weakness, head 1 chemotherapy for renal cancer EXAM: CHEST  2 VIEW COMPARISON:  None. FINDINGS: Cardiomegaly again noted. Atherosclerotic calcifications of thoracic aorta. Mild residual interstitial prominence bilaterally with improvement in aeration. No convincing pulmonary edema. Mild basilar atelectasis. No segmental infiltrates. Osteopenia and mild degenerative changes mid thoracic spine. IMPRESSION: Mild residual interstitial prominence bilaterally with improvement in aeration. No convincing pulmonary edema. Mild basilar atelectasis. No segmental infiltrates. Electronically Signed   By: Lahoma Crocker M.D.   On: 02/24/2016 11:01    Ct Head Wo Contrast Result Date: 02/24/2016 CLINICAL DATA:  Worsening weakness and nausea since discharge. Mental status deterioration. EXAM: CT HEAD WITHOUT CONTRAST TECHNIQUE: Contiguous axial images were obtained from the base of the skull through the vertex without intravenous contrast. COMPARISON:  12/19/2016 FINDINGS: Brain: The brain shows generalized atrophy. There chronic small-vessel ischemic changes affecting the hemispheric white matter. No evidence of acute infarction, mass lesion, hemorrhage, hydrocephalus or extra-axial collection. Vascular: There is atherosclerotic calcification of the major vessels at the base of the brain. Skull: Negative Sinuses/Orbits: Clear/normal Other: None  IMPRESSION: No acute finding by CT. Atrophy and chronic small-vessel ischemic changes as seen previously. Electronically Signed   By: Nelson Chimes M.D.   On: 02/24/2016 10:57    1235:  Pt orthostatic on VS; judicious IVF given. Dx and testing d/w pt and family.  Questions answered.  Verb understanding, agreeable to admit.  T/C to [facility] Dr. Jerilee Hoh, case discussed, including:  HPI, pertinent PM/SHx, VS/PE, dx testing, ED course and  treatment:  Agreeable to admit.    Final Clinical Impressions(s) / ED Diagnoses   Final diagnoses:  None    New Prescriptions New Prescriptions   No medications on file      Francine Graven, DO 02/28/16 1522

## 2016-02-24 NOTE — H&P (Signed)
History and Physical    Joann Hunter [id number] DOB: 03-17-1932 DOA: 02/24/2016  Referring MD/NP/PA: Francine Graven, EDP PCP: Monico Blitz, MD  Patient coming from: Home  Chief Complaint: Dizziness upon standing, worsened confusion  HPI: Joann Hunter is a 83 y.o. female with past medical history significant for hypertension, ischemic artery myopathy, stage IV chronic kidney disease, urothelial cancer, mild dementia who was recently discharged from the hospital 2 days ago after acute encephalopathy presumed due to dehydration and possibly ciprofloxacin effect. Patient's daughter Joann Hunter gives most of the history and states that really patient did not improve after discharge. This morning she did not eat and seemed weaker and she called the cancer center and they advised her to bring her back to the hospital for evaluation. In the ED she was found to be orthostatic, labs are essentially unchanged from discharge, she does have a creatinine of 1.72, a sodium of 133, WBC count of 2.4, hemoglobin of 9.3 and platelet count of 62. All of these abnormal labs are pretty much around her baseline. Admission requested for further evaluation and management.  Past Medical/Surgical History: Past Medical History:  Diagnosis Date  . Arthritis   . Back pain, chronic   . CAD (coronary artery disease)    NSTEMI 04/2011 with newly diagnosed 3V CAD s/p PCI/DES mLAD 04/11/11 with residual dz (per Dr. Burt Knack, should have consideration of PCI of the occluded RCA based on symptoms and/or consideration of outpatient nuclear perfusion testing after the patient recovers from her infarct)  . Cancer ([place])    kidney ( Nov.2017)  . Chronic kidney disease (CKD), stage IV (severe) ([place]) 12/13/2013  . Hyperglycemia   . Hyperlipidemia   . Hypertension   . Hypothyroidism   . Ischemic cardiomyopathy    EF 45% by cath 04/20/11, 50-55% by echo 04/22/11. hypotension limiting medication titration   . Myocardial infarction  2013  . Type 2 diabetes with nephropathy ([place]) 07/26/2014  . UTI (urinary tract infection) 12/13/2013    Past Surgical History:  Procedure Laterality Date  . BACK SURGERY    . BREAST LUMPECTOMY     right  . CORONARY STENT PLACEMENT    . LEFT HEART CATHETERIZATION WITH CORONARY ANGIOGRAM N/A 04/20/2011   Procedure: LEFT HEART CATHETERIZATION WITH CORONARY ANGIOGRAM;  Surgeon: Burnell Blanks, MD;  Location: [facility] CATH LAB;  Service: Cardiovascular;  Laterality: N/A;  . PERCUTANEOUS CORONARY STENT INTERVENTION (PCI-S) N/A 04/21/2011   Procedure: PERCUTANEOUS CORONARY STENT INTERVENTION (PCI-S);  Surgeon: Sherren Mocha, MD;  Location: [facility] CATH LAB;  Service: Cardiovascular;  Laterality: N/A;    Social History:  reports that she has never smoked. She has never used smokeless tobacco. She reports that she does not drink alcohol or use drugs.  Allergies: Allergies  Allergen Reactions  . Ciprofloxacin Other (See Comments)    Hallucination  . Lipitor [Atorvastatin] Other (See Comments)    Muscle Weakness  . Penicillins Rash    Has patient had a PCN reaction causing immediate rash, facial/tongue/throat swelling, SOB or lightheadedness with hypotension: unknown Has patient had a PCN reaction causing severe rash involving mucus membranes or skin necrosis: unknown Has patient had a PCN reaction that required hospitalization: unknown Has patient had a PCN reaction occurring within the last 10 years: unknown If all of the above answers are "NO", then may proceed with Cephalosporin use. 2    Family History:  Family History  Problem Relation Age of Onset  . Other Mother   . Cancer Father   .  Other      no known family cardiac disease  . Other Brother     Diptheria  . Depression Sister   . Pneumonia Sister   . Other Brother     murdered    Prior to Admission medications   Medication Sig Start Date End Date Taking? Authorizing Provider  amLODipine (NORVASC) 10 MG tablet Take 0.5  tablets (5 mg total) by mouth daily. 09/18/14  Yes Samuella Cota, MD  aspirin 81 MG chewable tablet Chew 81 mg by mouth daily.    Yes Historical Provider, MD  Atezolizumab (TECENTRIQ IV) Inject into the vein every 21 ( twenty-one) days.   Yes Historical Provider, MD  docusate sodium (COLACE) 100 MG capsule Take 1 capsule (100 mg total) by mouth 2 (two) times daily. 11/06/14  Yes Kathie Dike, MD  fish oil-omega-3 fatty acids 1000 MG capsule Take 1 g by mouth daily.   Yes Historical Provider, MD  ibuprofen (ADVIL,MOTRIN) 200 MG tablet Take 200 mg by mouth every 6 (six) hours as needed for moderate pain.   Yes Historical Provider, MD  levothyroxine (SYNTHROID, LEVOTHROID) 112 MCG tablet Take 1 tablet (112 mcg total) by mouth every morning. 12/16/13  Yes Verlee Monte, MD  meclizine (ANTIVERT) 25 MG tablet Take 25 mg by mouth 4 (four) times daily as needed. Dizziness   Yes Historical Provider, MD  metoprolol tartrate (LOPRESSOR) 25 MG tablet Take 12.5 mg by mouth 2 (two) times daily.  04/23/11  Yes Dayna N Dunn, PA-C  nitroGLYCERIN (NITROSTAT) 0.4 MG SL tablet Place 0.4 mg under the tongue every 5 (five) minutes x 3 doses as needed. For chest pain. 04/23/11  Yes Dayna N Dunn, PA-C  ondansetron (ZOFRAN) 8 MG tablet Take 1 tablet (8 mg total) by mouth every 8 (eight) hours as needed for nausea or vomiting. 01/29/16  Yes Patrici Ranks, MD  pantoprazole (PROTONIX) 40 MG tablet Take 1 tablet (40 mg total) by mouth daily. 11/06/14  Yes Kathie Dike, MD  prochlorperazine (COMPAZINE) 10 MG tablet Take 1 tablet (10 mg total) by mouth every 6 (six) hours as needed for nausea or vomiting. 01/29/16  Yes Patrici Ranks, MD  traMADol (ULTRAM) 50 MG tablet Take 50 mg by mouth 3 (three) times daily.  04/26/14  Yes Historical Provider, MD  Vitamin D, Ergocalciferol, (DRISDOL) 50000 UNITS CAPS Take 50,000 Units by mouth 2 (two) times a week. Tuesdays and Thurdays 05/30/12  Yes Historical Provider, MD    Review of  Systems:  Unable to obtain given confusion and dementia   Physical Exam: Vitals:   02/24/16 1157 02/24/16 1200 02/24/16 1300 02/24/16 1430  BP: (!) 98/48 (!) 89/67 (!) 105/44 (!) 123/52  Hunter: 75 72 71 73  Resp: 18 20 19 19   Temp:    97.8 F (36.6 C)  TempSrc:    Oral  SpO2: 97% 98% 94% 99%  Weight:      Height:         Constitutional: NAD, calm, comfortable,Oriented to person only not to place or time. Eyes: PERRL, lids and conjunctivae normal ENMT: Mucous membranes are dry. Posterior pharynx clear of any exudate or lesions.Normal dentition.  Neck: normal, supple, no masses, no thyromegaly Respiratory: clear to auscultation bilaterally, no wheezing, no crackles. Normal respiratory effort. No accessory muscle use.  Cardiovascular: Regular rate and rhythm, no murmurs / rubs / gallops. No extremity edema. 2+ pedal pulses. No carotid bruits.  Abdomen: no tenderness, no masses palpated. No hepatosplenomegaly. Bowel  sounds positive.  Musculoskeletal: no clubbing / cyanosis. No joint deformity upper and lower extremities. Good ROM, no contractures. Normal muscle tone.  Skin: no rashes, lesions, ulcers. No induration Neurologic: CN 2-12 grossly intact. Sensation intact, DTR normal. Strength 5/5 in all 4.    Labs on Admission: I have personally reviewed the following labs and imaging studies  CBC:  Recent Labs Lab 02/20/16 1558 02/21/16 0633 02/22/16 0622 02/24/16 1031  WBC 2.0* 2.3* 1.8* 2.4*  NEUTROABS  --   --   --  0.7*  HGB 11.2* 11.0* 10.5* 9.3*  HCT 32.5* 31.3* 29.9* 27.7*  MCV 90.3 89.4 89.8 92.3  PLT 42* 41* 40* 62*   Basic Metabolic Panel:  Recent Labs Lab 02/20/16 1558 02/21/16 0633 02/22/16 0622 02/24/16 1031  NA 136 136 135 133*  K 3.9 3.3* 3.6 3.5  CL 103 105 105 103  CO2 25 23 23 25   GLUCOSE 162* 156* 140* 144*  BUN 28* 21* 25* 25*  CREATININE 1.70* 1.50* 1.60* 1.72*  CALCIUM 8.8* 8.5* 8.5* 8.1*   GFR: Estimated Creatinine Clearance: 23.2  mL/min (by C-G formula based on SCr of 1.72 mg/dL (H)). Liver Function Tests:  Recent Labs Lab 02/20/16 1558 02/24/16 1031  AST 43* 40  ALT 31 31  ALKPHOS 75 80  BILITOT 1.4* 1.1  PROT 6.0* 5.2*  ALBUMIN 2.9* 2.5*   No results for input(s): LIPASE, AMYLASE in the last 168 hours. No results for input(s): AMMONIA in the last 168 hours. Coagulation Profile: No results for input(s): INR, PROTIME in the last 168 hours. Cardiac Enzymes:  Recent Labs Lab 02/24/16 1031  TROPONINI <0.03   BNP (last 3 results) No results for input(s): PROBNP in the last 8760 hours. HbA1C: No results for input(s): HGBA1C in the last 72 hours. CBG:  Recent Labs Lab 02/21/16 1137 02/21/16 1626 02/21/16 2102 02/22/16 0821 02/22/16 1203  GLUCAP 141* 194* 144* 119* 149*   Lipid Profile: No results for input(s): CHOL, HDL, LDLCALC, TRIG, CHOLHDL, LDLDIRECT in the last 72 hours. Thyroid Function Tests: No results for input(s): TSH, T4TOTAL, FREET4, T3FREE, THYROIDAB in the last 72 hours. Anemia Panel: No results for input(s): VITAMINB12, FOLATE, FERRITIN, TIBC, IRON, RETICCTPCT in the last 72 hours. Urine analysis:    Component Value Date/Time   COLORURINE YELLOW 02/24/2016 1000   APPEARANCEUR CLEAR 02/24/2016 1000   LABSPEC 1.015 02/24/2016 1000   PHURINE 6.0 02/24/2016 1000   GLUCOSEU NEGATIVE 02/24/2016 1000   HGBUR LARGE (A) 02/24/2016 1000   BILIRUBINUR NEGATIVE 02/24/2016 1000   KETONESUR NEGATIVE 02/24/2016 1000   PROTEINUR 100 (A) 02/24/2016 1000   UROBILINOGEN 0.2 11/05/2014 2000   NITRITE NEGATIVE 02/24/2016 1000   LEUKOCYTESUR NEGATIVE 02/24/2016 1000   Sepsis Labs: @LABRCNTIP (procalcitonin:4,lacticidven:4) ) Recent Results (from the past 240 hour(s))  Culture, Urine     Status: None   Collection Time: 02/20/16  6:10 PM  Result Value Ref Range Status   Specimen Description URINE, CATHETERIZED  Final   Special Requests NONE  Final   Culture   Final    NO  GROWTH Performed at [facility], [place] [address], [place], [place] [address]    Report Status 02/22/2016 FINAL  Final     Radiological Exams on Admission: Dg Chest 2 View  Result Date: 02/24/2016 CLINICAL DATA:  Weakness, head 1 chemotherapy for renal cancer EXAM: CHEST  2 VIEW COMPARISON:  None. FINDINGS: Cardiomegaly again noted. Atherosclerotic calcifications of thoracic aorta. Mild residual interstitial prominence bilaterally with  improvement in aeration. No convincing pulmonary edema. Mild basilar atelectasis. No segmental infiltrates. Osteopenia and mild degenerative changes mid thoracic spine. IMPRESSION: Mild residual interstitial prominence bilaterally with improvement in aeration. No convincing pulmonary edema. Mild basilar atelectasis. No segmental infiltrates. Electronically Signed   By: Lahoma Crocker M.D.   On: 02/24/2016 11:01   Ct Head Wo Contrast  Result Date: 02/24/2016 CLINICAL DATA:  Worsening weakness and nausea since discharge. Mental status deterioration. EXAM: CT HEAD WITHOUT CONTRAST TECHNIQUE: Contiguous axial images were obtained from the base of the skull through the vertex without intravenous contrast. COMPARISON:  12/19/2016 FINDINGS: Brain: The brain shows generalized atrophy. There chronic small-vessel ischemic changes affecting the hemispheric white matter. No evidence of acute infarction, mass lesion, hemorrhage, hydrocephalus or extra-axial collection. Vascular: There is atherosclerotic calcification of the major vessels at the base of the brain. Skull: Negative Sinuses/Orbits: Clear/normal Other: None IMPRESSION: No acute finding by CT. Atrophy and chronic small-vessel ischemic changes as seen previously. Electronically Signed   By: Nelson Chimes M.D.   On: 02/24/2016 10:57    EKG: Independently reviewed. Normal sinus rhythm at a rate of 60, normal axis, no acute ischemic changes  Assessment/Plan Active Problems:   Chronic kidney disease (CKD), stage IV  (severe) ([facility])   Type 2 diabetes with nephropathy ([facility])   Essential hypertension   Urothelial cancer ([facility])   Orthostasis   Acute encephalopathy    Acute on chronic encephalopathy -Likely due to effects of dehydration and failure to thrive on top of baseline dementia. -I have explained to patient's daughter that hospital stay will likely make confusion worse given it is a well-known fact that older patients with dementia can develop hospital delirium/sundowning. It is also unclear whether she will return to her baseline mental function post discharge as many patients with dementia have a stepwise decline every time they are admitted to the hospital.  Orthostatic hypotension -Give IV fluids overnight, recheck orthostatic vital signs with every shift.  Urothelial cancer -Follow-up outpatient with oncology as scheduled.  Essential hypertension -I believe it is imperative that we discontinue her metoprolol and amlodipine at this time. -Suspect that with her worsened kidney function and possibly declined liver function as seen normally with aging, she is less actively metabolizing medications and may no longer need antihypertensive agents, especially now that she is orthostatic. -Monitor blood pressure.  Type 2 diabetes -Check hemoglobin A1c. -Placed on sliding scale insulin while in the hospital.  Stage IV chronic kidney disease -Creatinine at baseline is anywhere between 1.6 and 1.8. Currently at baseline.   DVT prophylaxis: SCDs given chronic thrombocytopenia  Code Status: Full code  Family Communication: Daughter at bedside updated on plan of care and all questions answered  Disposition Plan: Home likely in 24 hours  Consults called: None  Admission status: Observation    Time Spent: 80 minutes  Lelon Frohlich MD [facility] Pager [phone]  If 7PM-7AM, please contact night-coverage [email or web address] Password [facility]  02/24/2016, 5:13 PM

## 2016-02-24 NOTE — ED Triage Notes (Addendum)
Discharged from hospital Monday with dehydration and possible UTI.  Pt c/o increasing weakness and nausea since discharge.  Has had one chemo treatment for kidney cancer.

## 2016-02-24 NOTE — ED Notes (Signed)
Attempted to call report, nurse unavailable at this time.

## 2016-02-25 ENCOUNTER — Ambulatory Visit (HOSPITAL_COMMUNITY): Payer: Medicare Other

## 2016-02-25 DIAGNOSIS — N184 Chronic kidney disease, stage 4 (severe): Secondary | ICD-10-CM | POA: Diagnosis not present

## 2016-02-25 DIAGNOSIS — I951 Orthostatic hypotension: Secondary | ICD-10-CM | POA: Diagnosis not present

## 2016-02-25 DIAGNOSIS — G9340 Encephalopathy, unspecified: Secondary | ICD-10-CM | POA: Diagnosis not present

## 2016-02-25 DIAGNOSIS — I10 Essential (primary) hypertension: Secondary | ICD-10-CM | POA: Diagnosis not present

## 2016-02-25 LAB — HEMOGLOBIN A1C
HEMOGLOBIN A1C: 6.3 % — AB (ref 4.8–5.6)
MEAN PLASMA GLUCOSE: 134

## 2016-02-25 LAB — CBC
HEMATOCRIT: 26.1 % — AB (ref 36.0–46.0)
HEMOGLOBIN: 9.1 g/dL — AB (ref 12.0–15.0)
MCH: 31.4 pg (ref 26.0–34.0)
MCHC: 34.9 g/dL (ref 30.0–36.0)
MCV: 90 fL (ref 78.0–100.0)
Platelets: 70 10*3/uL — ABNORMAL LOW (ref 150–400)
RBC: 2.9 MIL/uL — AB (ref 3.87–5.11)
RDW: 14.4 % (ref 11.5–15.5)
WBC: 2.9 10*3/uL — AB (ref 4.0–10.5)

## 2016-02-25 LAB — BASIC METABOLIC PANEL
Anion gap: 7 (ref 5–15)
BUN: 20 mg/dL (ref 6–20)
CALCIUM: 8 mg/dL — AB (ref 8.9–10.3)
CO2: 23 mmol/L (ref 22–32)
CREATININE: 1.64 mg/dL — AB (ref 0.44–1.00)
Chloride: 105 mmol/L (ref 101–111)
GFR calc non Af Amer: 28 mL/min — ABNORMAL LOW (ref >60)
GFR, EST AFRICAN AMERICAN: 33 mL/min — AB (ref >60)
Glucose, Bld: 137 mg/dL — ABNORMAL HIGH (ref 65–99)
Potassium: 3.4 mmol/L — ABNORMAL LOW (ref 3.5–5.1)
SODIUM: 135 mmol/L (ref 135–145)

## 2016-02-25 LAB — GLUCOSE, CAPILLARY
GLUCOSE-CAPILLARY: 137 mg/dL — AB (ref 65–99)
GLUCOSE-CAPILLARY: 207 mg/dL — AB (ref 65–99)

## 2016-02-25 NOTE — Care Management Obs Status (Signed)
[place] NOTIFICATION   Patient Details  Name: Joann Hunter MRN: [id number] Date of Birth: 12/11/1932   Medicare Observation Status Notification Given:  No (discharge within 24 hours)    Verdun Rackley, Chauncey [place], RN 02/25/2016, 12:10 PM

## 2016-02-25 NOTE — Discharge Summary (Signed)
Physician Discharge Summary  Joann Hunter [id number] DOB: 02/01/1932 DOA: 02/24/2016  PCP: Monico Blitz, MD  Admit date: 02/24/2016 Discharge date: 02/25/2016  Time spent: 45 minutes  Recommendations for Outpatient Follow-up:  -Will be discharged home today. -Advised to follow up with PCP in 2 weeks.   Discharge Diagnoses:  Active Problems:   Chronic kidney disease (CKD), stage IV (severe) (HCC)   Type 2 diabetes with nephropathy ([place])   Essential hypertension   Urothelial cancer ([place])   Orthostasis   Acute encephalopathy   Discharge Condition: Stable and improved  Filed Weights   02/24/16 0928  Weight: 68 kg (150 lb)    History of present illness:  Joann Hunter is a 83 y.o. female with past medical history significant for hypertension, ischemic artery myopathy, stage IV chronic kidney disease, urothelial cancer, mild dementia who was recently discharged from the hospital 2 days ago after acute encephalopathy presumed due to dehydration and possibly ciprofloxacin effect. Patient's daughter [name] gives most of the history and states that really patient did not improve after discharge. This morning she did not eat and seemed weaker and she called the cancer center and they advised her to bring her back to the hospital for evaluation. In the ED she was found to be orthostatic, labs are essentially unchanged from discharge, she does have a creatinine of 1.72, a sodium of 133, WBC count of 2.4, hemoglobin of 9.3 and platelet count of 62. All of these abnormal labs are pretty much around her baseline. Admission requested for further evaluation and management.  Hospital Course:   Orthostatic hypotension -Resolved with administration of IV fluids overnight and cessation of antihypertensive agents including metoprolol and amlodipine.  Acute on chronic encephalopathy -Patient is currently at baseline mental status, likely due to effects of dehydration and failure to thrive  on top of baseline dementia.  Urothelial cancer -Follow-up with oncology as outpatient as scheduled.  Essential hypertension -As above, BP meds are currently on hold.  Type 2 diabetes -Fair control, continue current management.  Chronic kidney disease stage IV -At baseline.  Procedures:  None   Consultations:  None  Discharge Instructions  Discharge Instructions    Diet - low sodium heart healthy    Complete by:  As directed    Increase activity slowly    Complete by:  As directed      Allergies as of 02/25/2016      Reactions   Ciprofloxacin Other (See Comments)   Hallucination   Lipitor [atorvastatin] Other (See Comments)   Muscle Weakness   Penicillins Rash   Has patient had a PCN reaction causing immediate rash, facial/tongue/throat swelling, SOB or lightheadedness with hypotension: unknown Has patient had a PCN reaction causing severe rash involving mucus membranes or skin necrosis: unknown Has patient had a PCN reaction that required hospitalization: unknown Has patient had a PCN reaction occurring within the last 10 years: unknown If all of the above answers are "NO", then may proceed with Cephalosporin use. 2      Medication List    STOP taking these medications   amLODipine 10 MG tablet Commonly known as:  NORVASC   metoprolol tartrate 25 MG tablet Commonly known as:  LOPRESSOR   traMADol 50 MG tablet Commonly known as:  ULTRAM     TAKE these medications   aspirin 81 MG chewable tablet Chew 81 mg by mouth daily.   docusate sodium 100 MG capsule Commonly known as:  COLACE Take 1 capsule (100  mg total) by mouth 2 (two) times daily.   fish oil-omega-3 fatty acids 1000 MG capsule Take 1 g by mouth daily.   ibuprofen 200 MG tablet Commonly known as:  ADVIL,MOTRIN Take 200 mg by mouth every 6 (six) hours as needed for moderate pain.   levothyroxine 112 MCG tablet Commonly known as:  SYNTHROID, LEVOTHROID Take 1 tablet (112 mcg total) by  mouth every morning.   meclizine 25 MG tablet Commonly known as:  ANTIVERT Take 25 mg by mouth 4 (four) times daily as needed. Dizziness   nitroGLYCERIN 0.4 MG SL tablet Commonly known as:  NITROSTAT Place 0.4 mg under the tongue every 5 (five) minutes x 3 doses as needed. For chest pain.   ondansetron 8 MG tablet Commonly known as:  ZOFRAN Take 1 tablet (8 mg total) by mouth every 8 (eight) hours as needed for nausea or vomiting.   pantoprazole 40 MG tablet Commonly known as:  PROTONIX Take 1 tablet (40 mg total) by mouth daily.   prochlorperazine 10 MG tablet Commonly known as:  COMPAZINE Take 1 tablet (10 mg total) by mouth every 6 (six) hours as needed for nausea or vomiting.   TECENTRIQ IV Inject into the vein every 21 ( twenty-one) days.   Vitamin D (Ergocalciferol) 50000 units Caps capsule Commonly known as:  DRISDOL Take 50,000 Units by mouth 2 (two) times a week. Tuesdays and Thurdays      Allergies  Allergen Reactions  . Ciprofloxacin Other (See Comments)    Hallucination  . Lipitor [Atorvastatin] Other (See Comments)    Muscle Weakness  . Penicillins Rash    Has patient had a PCN reaction causing immediate rash, facial/tongue/throat swelling, SOB or lightheadedness with hypotension: unknown Has patient had a PCN reaction causing severe rash involving mucus membranes or skin necrosis: unknown Has patient had a PCN reaction that required hospitalization: unknown Has patient had a PCN reaction occurring within the last 10 years: unknown If all of the above answers are "NO", then may proceed with Cephalosporin use. 2      The results of significant diagnostics from this hospitalization (including imaging, microbiology, ancillary and laboratory) are listed below for reference.    Significant Diagnostic Studies: Dg Chest 2 View  Result Date: 02/24/2016 CLINICAL DATA:  Weakness, head 1 chemotherapy for renal cancer EXAM: CHEST  2 VIEW COMPARISON:  None.  FINDINGS: Cardiomegaly again noted. Atherosclerotic calcifications of thoracic aorta. Mild residual interstitial prominence bilaterally with improvement in aeration. No convincing pulmonary edema. Mild basilar atelectasis. No segmental infiltrates. Osteopenia and mild degenerative changes mid thoracic spine. IMPRESSION: Mild residual interstitial prominence bilaterally with improvement in aeration. No convincing pulmonary edema. Mild basilar atelectasis. No segmental infiltrates. Electronically Signed   By: Lahoma Crocker M.D.   On: 02/24/2016 11:01   Dg Chest 2 View  Result Date: 02/20/2016 CLINICAL DATA:  Patient with confusion. Speech problems. Headache, dizziness and weakness. EXAM: CHEST  2 VIEW COMPARISON:  Chest radiograph 09/14/2014. FINDINGS: Patient is rotated to the left. Cardiomegaly. Aortic atherosclerosis. Monitoring leads overlie the patient. Bilateral interstitial pulmonary opacities. No large area pulmonary consolidation. Thoracic spine degenerative changes. No pleural effusion or pneumothorax. IMPRESSION: Cardiomegaly. Interstitial opacities bilaterally may represent pulmonary edema. Electronically Signed   By: Lovey Newcomer M.D.   On: 02/20/2016 17:06   Ct Head Wo Contrast  Result Date: 02/24/2016 CLINICAL DATA:  Worsening weakness and nausea since discharge. Mental status deterioration. EXAM: CT HEAD WITHOUT CONTRAST TECHNIQUE: Contiguous axial images were obtained from  the base of the skull through the vertex without intravenous contrast. COMPARISON:  12/19/2016 FINDINGS: Brain: The brain shows generalized atrophy. There chronic small-vessel ischemic changes affecting the hemispheric white matter. No evidence of acute infarction, mass lesion, hemorrhage, hydrocephalus or extra-axial collection. Vascular: There is atherosclerotic calcification of the major vessels at the base of the brain. Skull: Negative Sinuses/Orbits: Clear/normal Other: None IMPRESSION: No acute finding by CT. Atrophy and  chronic small-vessel ischemic changes as seen previously. Electronically Signed   By: Nelson Chimes M.D.   On: 02/24/2016 10:57   Ct Head Wo Contrast  Result Date: 02/20/2016 CLINICAL DATA:  Patient with confusion, headache and dizziness. Generalized weakness. EXAM: CT HEAD WITHOUT CONTRAST TECHNIQUE: Contiguous axial images were obtained from the base of the skull through the vertex without intravenous contrast. COMPARISON:  Brain CT 09/14/2014. FINDINGS: Brain: Ventricles and sulci are appropriate for patient's age. Periventricular and subcortical white matter hypodensity compatible with chronic microvascular ischemic changes. Vascular: Unremarkable. Skull: Intact. Sinuses/Orbits: Paranasal sinuses are unremarkable. Mastoid air cells are well aerated. Orbits are unremarkable. Other: None. IMPRESSION: No acute intracranial process. Atrophy and chronic microvascular ischemic changes. Electronically Signed   By: Lovey Newcomer M.D.   On: 02/20/2016 17:24    Microbiology: Recent Results (from the past 240 hour(s))  Culture, Urine     Status: None   Collection Time: 02/20/16  6:10 PM  Result Value Ref Range Status   Specimen Description URINE, CATHETERIZED  Final   Special Requests NONE  Final   Culture   Final    NO GROWTH Performed at [facility], [address], [place], [place] [address]    Report Status 02/22/2016 FINAL  Final     Labs: Basic Metabolic Panel:  Recent Labs Lab 02/20/16 1558 02/21/16 0633 02/22/16 0622 02/24/16 1031 02/25/16 0551  NA 136 136 135 133* 135  K 3.9 3.3* 3.6 3.5 3.4*  CL 103 105 105 103 105  CO2 25 23 23 25 23   GLUCOSE 162* 156* 140* 144* 137*  BUN 28* 21* 25* 25* 20  CREATININE 1.70* 1.50* 1.60* 1.72* 1.64*  CALCIUM 8.8* 8.5* 8.5* 8.1* 8.0*   Liver Function Tests:  Recent Labs Lab 02/20/16 1558 02/24/16 1031  AST 43* 40  ALT 31 31  ALKPHOS 75 80  BILITOT 1.4* 1.1  PROT 6.0* 5.2*  ALBUMIN 2.9* 2.5*   No results for input(s): LIPASE,  AMYLASE in the last 168 hours. No results for input(s): AMMONIA in the last 168 hours. CBC:  Recent Labs Lab 02/20/16 1558 02/21/16 0633 02/22/16 0622 02/24/16 1031 02/25/16 0551  WBC 2.0* 2.3* 1.8* 2.4* 2.9*  NEUTROABS  --   --   --  0.7*  --   HGB 11.2* 11.0* 10.5* 9.3* 9.1*  HCT 32.5* 31.3* 29.9* 27.7* 26.1*  MCV 90.3 89.4 89.8 92.3 90.0  PLT 42* 41* 40* 62* 70*   Cardiac Enzymes:  Recent Labs Lab 02/24/16 1031  TROPONINI <0.03   BNP: BNP (last 3 results) No results for input(s): BNP in the last 8760 hours.  ProBNP (last 3 results) No results for input(s): PROBNP in the last 8760 hours.  CBG:  Recent Labs Lab 02/22/16 0821 02/22/16 1203 02/24/16 2100 02/25/16 0720 02/25/16 1115  GLUCAP 119* 149* 119* 137* 207*       Signed:  Lelon Frohlich  [facility] Pager: [phone] 02/25/2016, 5:26 PM

## 2016-02-26 LAB — URINE CULTURE: Culture: NO GROWTH

## 2016-03-03 ENCOUNTER — Encounter (HOSPITAL_COMMUNITY): Payer: Medicare Other | Attending: Hematology & Oncology

## 2016-03-03 ENCOUNTER — Encounter (HOSPITAL_COMMUNITY): Payer: Self-pay

## 2016-03-03 VITALS — BP 142/62 | HR 95 | Temp 98.0°F | Resp 18

## 2016-03-03 DIAGNOSIS — Z5112 Encounter for antineoplastic immunotherapy: Secondary | ICD-10-CM

## 2016-03-03 DIAGNOSIS — C689 Malignant neoplasm of urinary organ, unspecified: Secondary | ICD-10-CM | POA: Insufficient documentation

## 2016-03-03 DIAGNOSIS — C787 Secondary malignant neoplasm of liver and intrahepatic bile duct: Secondary | ICD-10-CM

## 2016-03-03 LAB — COMPREHENSIVE METABOLIC PANEL
ALT: 13 U/L — AB (ref 14–54)
AST: 23 U/L (ref 15–41)
Albumin: 2.7 g/dL — ABNORMAL LOW (ref 3.5–5.0)
Alkaline Phosphatase: 72 U/L (ref 38–126)
Anion gap: 5 (ref 5–15)
BUN: 10 mg/dL (ref 6–20)
CHLORIDE: 106 mmol/L (ref 101–111)
CO2: 25 mmol/L (ref 22–32)
CREATININE: 1.58 mg/dL — AB (ref 0.44–1.00)
Calcium: 8.3 mg/dL — ABNORMAL LOW (ref 8.9–10.3)
GFR calc Af Amer: 34 mL/min — ABNORMAL LOW (ref >60)
GFR, EST NON AFRICAN AMERICAN: 30 mL/min — AB (ref >60)
Glucose, Bld: 176 mg/dL — ABNORMAL HIGH (ref 65–99)
POTASSIUM: 3.8 mmol/L (ref 3.5–5.1)
SODIUM: 136 mmol/L (ref 135–145)
Total Bilirubin: 0.6 mg/dL (ref 0.3–1.2)
Total Protein: 5.5 g/dL — ABNORMAL LOW (ref 6.5–8.1)

## 2016-03-03 LAB — CBC WITH DIFFERENTIAL/PLATELET
BASOS ABS: 0 10*3/uL (ref 0.0–0.1)
BASOS PCT: 0 %
EOS ABS: 0 10*3/uL (ref 0.0–0.7)
EOS PCT: 0 %
HCT: 28.9 % — ABNORMAL LOW (ref 36.0–46.0)
Hemoglobin: 9.6 g/dL — ABNORMAL LOW (ref 12.0–15.0)
LYMPHS PCT: 26 %
Lymphs Abs: 1.1 10*3/uL (ref 0.7–4.0)
MCH: 31.6 pg (ref 26.0–34.0)
MCHC: 33.2 g/dL (ref 30.0–36.0)
MCV: 95.1 fL (ref 78.0–100.0)
MONO ABS: 0.5 10*3/uL (ref 0.1–1.0)
Monocytes Relative: 11 %
Neutro Abs: 2.7 10*3/uL (ref 1.7–7.7)
Neutrophils Relative %: 63 %
PLATELETS: 227 10*3/uL (ref 150–400)
RBC: 3.04 MIL/uL — AB (ref 3.87–5.11)
RDW: 16.6 % — AB (ref 11.5–15.5)
WBC: 4.3 10*3/uL (ref 4.0–10.5)

## 2016-03-03 LAB — TSH: TSH: 1.902 u[IU]/mL (ref 0.350–4.500)

## 2016-03-03 MED ORDER — SODIUM CHLORIDE 0.9 % IV SOLN
Freq: Once | INTRAVENOUS | Status: AC
  Administered 2016-03-03: 13:00:00 via INTRAVENOUS

## 2016-03-03 MED ORDER — SODIUM CHLORIDE 0.9% FLUSH
3.0000 mL | INTRAVENOUS | Status: DC | PRN
  Administered 2016-03-03: 3 mL via INTRAVENOUS
  Filled 2016-03-03: qty 10

## 2016-03-03 MED ORDER — ATEZOLIZUMAB CHEMO INJECTION 1200 MG/20ML
1200.0000 mg | Freq: Once | INTRAVENOUS | Status: AC
  Administered 2016-03-03: 1200 mg via INTRAVENOUS
  Filled 2016-03-03: qty 20

## 2016-03-03 NOTE — Progress Notes (Signed)
Joann Hunter tolerated chemo tx well without complaints or incident.Labs reviewed prior to administering chemotherapy VSS upon discharge. Pt discharged via wheelchair in satisfactory condition accompanied by her daughter

## 2016-03-03 NOTE — Patient Instructions (Signed)
[facility] Discharge Instructions for Patients Receiving Chemotherapy   Beginning January 23rd 2017 lab work for the Cancer Center will be done in the  Main lab at [facility] on 1st floor. If you have a lab appointment with the Cancer Center please come in thru the  Main Entrance and check in at the main information desk   Today you received the following chemotherapy agents Tecentriq. Follow-up as scheduled. Call clinic for any questions or concerns  To help prevent nausea and vomiting after your treatment, we encourage you to take your nausea medication   If you develop nausea and vomiting, or diarrhea that is not controlled by your medication, call the clinic.  The clinic phone number is [phone]. Office hours are Monday-Friday 8:30am-5:00pm.  BELOW ARE SYMPTOMS THAT SHOULD BE REPORTED IMMEDIATELY:  *FEVER GREATER THAN 101.0 F  *CHILLS WITH OR WITHOUT FEVER  NAUSEA AND VOMITING THAT IS NOT CONTROLLED WITH YOUR NAUSEA MEDICATION  *UNUSUAL SHORTNESS OF BREATH  *UNUSUAL BRUISING OR BLEEDING  TENDERNESS IN MOUTH AND THROAT WITH OR WITHOUT PRESENCE OF ULCERS  *URINARY PROBLEMS  *BOWEL PROBLEMS  UNUSUAL RASH Items with * indicate a potential emergency and should be followed up as soon as possible. If you have an emergency after office hours please contact your primary care physician or go to the nearest emergency department.  Please call the clinic during office hours if you have any questions or concerns.   You may also contact the Patient Navigator at [phone] should you have any questions or need assistance in obtaining follow up care.      Resources For Cancer Patients and their Caregivers ? American Cancer Society: Can assist with transportation, wigs, general needs, runs Look Good Feel Better.        1-888-227-6333 ? Cancer Care: Provides financial assistance, online support groups, medication/co-pay assistance.  1-800-813-HOPE  (4673) ? [facility] Assists [place] cancer patients and their families through emotional , educational and financial support.  336-427-4357 ? [place] DSS Where to apply for food stamps, Medicaid and utility assistance. 336-342-1394 ? [facility]: Transportation to medical appointments. 336-347-2287 ? Social Security Administration: May apply for disability if have a Stage IV cancer. 336-342-7796 1-800-772-1213 ? [place] Aging, Disability and Transit Services: Assists with nutrition, care and transit needs. 336-349-2343         

## 2016-03-11 ENCOUNTER — Other Ambulatory Visit (HOSPITAL_COMMUNITY): Payer: Medicare Other

## 2016-03-17 ENCOUNTER — Ambulatory Visit (HOSPITAL_COMMUNITY): Payer: Medicare Other

## 2016-03-24 ENCOUNTER — Encounter (HOSPITAL_COMMUNITY): Payer: Self-pay | Admitting: Hematology

## 2016-03-24 ENCOUNTER — Encounter (HOSPITAL_BASED_OUTPATIENT_CLINIC_OR_DEPARTMENT_OTHER): Payer: Medicare Other

## 2016-03-24 ENCOUNTER — Encounter (HOSPITAL_BASED_OUTPATIENT_CLINIC_OR_DEPARTMENT_OTHER): Payer: Medicare Other | Admitting: Hematology

## 2016-03-24 ENCOUNTER — Encounter (HOSPITAL_COMMUNITY): Payer: Medicare Other

## 2016-03-24 VITALS — BP 165/64 | HR 81 | Temp 98.2°F | Resp 20

## 2016-03-24 VITALS — BP 175/77 | HR 90 | Temp 98.3°F | Resp 16 | Wt 157.0 lb

## 2016-03-24 DIAGNOSIS — E039 Hypothyroidism, unspecified: Secondary | ICD-10-CM

## 2016-03-24 DIAGNOSIS — R4182 Altered mental status, unspecified: Secondary | ICD-10-CM | POA: Diagnosis not present

## 2016-03-24 DIAGNOSIS — C689 Malignant neoplasm of urinary organ, unspecified: Secondary | ICD-10-CM

## 2016-03-24 DIAGNOSIS — C787 Secondary malignant neoplasm of liver and intrahepatic bile duct: Secondary | ICD-10-CM | POA: Diagnosis not present

## 2016-03-24 DIAGNOSIS — N184 Chronic kidney disease, stage 4 (severe): Secondary | ICD-10-CM

## 2016-03-24 DIAGNOSIS — Z5112 Encounter for antineoplastic immunotherapy: Secondary | ICD-10-CM | POA: Diagnosis not present

## 2016-03-24 LAB — COMPREHENSIVE METABOLIC PANEL
ALBUMIN: 3 g/dL — AB (ref 3.5–5.0)
ALK PHOS: 72 U/L (ref 38–126)
ALT: 10 U/L — AB (ref 14–54)
AST: 21 U/L (ref 15–41)
Anion gap: 7 (ref 5–15)
BILIRUBIN TOTAL: 0.6 mg/dL (ref 0.3–1.2)
BUN: 19 mg/dL (ref 6–20)
CO2: 26 mmol/L (ref 22–32)
CREATININE: 1.81 mg/dL — AB (ref 0.44–1.00)
Calcium: 9 mg/dL (ref 8.9–10.3)
Chloride: 103 mmol/L (ref 101–111)
GFR calc Af Amer: 29 mL/min — ABNORMAL LOW (ref >60)
GFR, EST NON AFRICAN AMERICAN: 25 mL/min — AB (ref >60)
GLUCOSE: 163 mg/dL — AB (ref 65–99)
POTASSIUM: 3.6 mmol/L (ref 3.5–5.1)
Sodium: 136 mmol/L (ref 135–145)
TOTAL PROTEIN: 5.7 g/dL — AB (ref 6.5–8.1)

## 2016-03-24 LAB — CBC WITH DIFFERENTIAL/PLATELET
BASOS ABS: 0.1 10*3/uL (ref 0.0–0.1)
Basophils Relative: 2 %
Eosinophils Absolute: 0.2 10*3/uL (ref 0.0–0.7)
Eosinophils Relative: 5 %
HEMATOCRIT: 31.9 % — AB (ref 36.0–46.0)
HEMOGLOBIN: 10.7 g/dL — AB (ref 12.0–15.0)
LYMPHS PCT: 32 %
Lymphs Abs: 1.1 10*3/uL (ref 0.7–4.0)
MCH: 32.2 pg (ref 26.0–34.0)
MCHC: 33.5 g/dL (ref 30.0–36.0)
MCV: 96.1 fL (ref 78.0–100.0)
Monocytes Absolute: 0.4 10*3/uL (ref 0.1–1.0)
Monocytes Relative: 11 %
NEUTROS PCT: 50 %
Neutro Abs: 1.7 10*3/uL (ref 1.7–7.7)
Platelets: 226 10*3/uL (ref 150–400)
RBC: 3.32 MIL/uL — AB (ref 3.87–5.11)
RDW: 14.7 % (ref 11.5–15.5)
WBC: 3.3 10*3/uL — ABNORMAL LOW (ref 4.0–10.5)

## 2016-03-24 MED ORDER — SODIUM CHLORIDE 0.9% FLUSH
3.0000 mL | INTRAVENOUS | Status: DC | PRN
  Administered 2016-03-24: 3 mL via INTRAVENOUS
  Filled 2016-03-24: qty 10

## 2016-03-24 MED ORDER — SODIUM CHLORIDE 0.9 % IV SOLN
Freq: Once | INTRAVENOUS | Status: AC
  Administered 2016-03-24: 12:00:00 via INTRAVENOUS

## 2016-03-24 MED ORDER — SODIUM CHLORIDE 0.9 % IV SOLN
1200.0000 mg | Freq: Once | INTRAVENOUS | Status: AC
  Administered 2016-03-24: 1200 mg via INTRAVENOUS
  Filled 2016-03-24: qty 20

## 2016-03-24 NOTE — Progress Notes (Signed)
Joann Hunter tolerated chemo tx well without complaints or incident. Labs reviewed with Dr. Irene Limbo prior to administering chemotherapy. VSS upon discharge. Pt discharged via wheelchair in satisfactory condition accompanied by her daughter

## 2016-03-24 NOTE — Patient Instructions (Addendum)
[place] at [facility] Discharge Instructions  RECOMMENDATIONS MADE BY THE CONSULTANT AND ANY TEST RESULTS WILL BE SENT TO YOUR REFERRING PHYSICIAN.  You were seen today by Dr. Irene Limbo Follow up in 3 weeks with next treatment and lab work See Amy up front for appointments   Thank you for choosing [place] at [facility] to provide your oncology and hematology care.  To afford each patient quality time with our provider, please arrive at least 15 minutes before your scheduled appointment time.    If you have a lab appointment with the [facility] please come in thru the  Main Entrance and check in at the main information desk  You need to re-schedule your appointment should you arrive 10 or more minutes late.  We strive to give you quality time with our providers, and arriving late affects you and other patients whose appointments are after yours.  Also, if you no show three or more times for appointments you may be dismissed from the clinic at the providers discretion.     Again, thank you for choosing [facility].  Our hope is that these requests will decrease the amount of time that you wait before being seen by our physicians.       _____________________________________________________________  Should you have questions after your visit to [facility], please contact our office at [phone] between the hours of 8:30 a.m. and 4:30 p.m.  Voicemails left after 4:30 p.m. will not be returned until the following business day.  For prescription refill requests, have your pharmacy contact our office.       Resources For Cancer Patients and their Caregivers ? American Cancer Society: Can assist with transportation, wigs, general needs, runs Look Good Feel Better.        571-001-4201 ? Cancer Care: Provides financial assistance, online support groups, medication/co-pay assistance.  1-800-813-HOPE  609 327 2836) ? [facility] Assists [place] cancer patients and their families through emotional , educational and financial support.  (709)440-1194 ? [place] DSS Where to apply for food stamps, Medicaid and utility assistance. 415-111-0025 ? [facility]: Transportation to medical appointments. 252-483-4384 ? Social Security Administration: May apply for disability if have a Stage IV cancer. 726-077-3428 747-156-7465 ? [employer], Disability and Transit Services: Assists with nutrition, care and transit needs. [facility] Support Programs: @10RELATIVEDAYS @ > Cancer Support Group  2nd Tuesday of the month 1pm-2pm, Journey Room  > Creative Journey  3rd Tuesday of the month 1130am-1pm, Journey Room  > Look Good Feel Better  1st Wednesday of the month 10am-12 noon, Journey Room (Call [facility] to register [phone])

## 2016-03-24 NOTE — Patient Instructions (Signed)
[facility] Discharge Instructions for Patients Receiving Chemotherapy   Beginning January 23rd 2017 lab work for the Cancer Center will be done in the  Main lab at [facility] on 1st floor. If you have a lab appointment with the Cancer Center please come in thru the  Main Entrance and check in at the main information desk   Today you received the following chemotherapy agents Tecentriq. Follow-up as scheduled. Call clinic for any questions or concerns  To help prevent nausea and vomiting after your treatment, we encourage you to take your nausea medication   If you develop nausea and vomiting, or diarrhea that is not controlled by your medication, call the clinic.  The clinic phone number is [phone]. Office hours are Monday-Friday 8:30am-5:00pm.  BELOW ARE SYMPTOMS THAT SHOULD BE REPORTED IMMEDIATELY:  *FEVER GREATER THAN 101.0 F  *CHILLS WITH OR WITHOUT FEVER  NAUSEA AND VOMITING THAT IS NOT CONTROLLED WITH YOUR NAUSEA MEDICATION  *UNUSUAL SHORTNESS OF BREATH  *UNUSUAL BRUISING OR BLEEDING  TENDERNESS IN MOUTH AND THROAT WITH OR WITHOUT PRESENCE OF ULCERS  *URINARY PROBLEMS  *BOWEL PROBLEMS  UNUSUAL RASH Items with * indicate a potential emergency and should be followed up as soon as possible. If you have an emergency after office hours please contact your primary care physician or go to the nearest emergency department.  Please call the clinic during office hours if you have any questions or concerns.   You may also contact the Patient Navigator at [phone] should you have any questions or need assistance in obtaining follow up care.      Resources For Cancer Patients and their Caregivers ? American Cancer Society: Can assist with transportation, wigs, general needs, runs Look Good Feel Better.        1-888-227-6333 ? Cancer Care: Provides financial assistance, online support groups, medication/co-pay assistance.  1-800-813-HOPE  (4673) ? [facility] Assists [place] cancer patients and their families through emotional , educational and financial support.  336-427-4357 ? [place] DSS Where to apply for food stamps, Medicaid and utility assistance. 336-342-1394 ? [facility]: Transportation to medical appointments. 336-347-2287 ? Social Security Administration: May apply for disability if have a Stage IV cancer. 336-342-7796 1-800-772-1213 ? [place] Aging, Disability and Transit Services: Assists with nutrition, care and transit needs. 336-349-2343         

## 2016-03-26 NOTE — Progress Notes (Signed)
Marland Kitchen  HEMATOLOGY ONCOLOGY PROGRESS NOTE  Date of service: .03/24/2016  Patient Care Team: Monico Blitz, MD as PCP - General (Internal Medicine)  CC: f/u for metastatic urothelial carcinoma  Diagnosis: Stage IV Urothelial Carcinoma with liver mets  Current Treatment: Atezolizumab q3 weeks  SUMMARY OF ONCOLOGIC HISTORY:   Urothelial cancer ([place])   10/22/2015 Imaging    Presented with recurrent UTI and hematuria. Underwent cystoscopy and retrograde evaluation with Dr. Exie Parody revealed multiple filling defects in distal (R) ureter. Marked irregularly of proximal (R) ureter concerning for malignancy. (+) hydronephrosis noted and tumor noted out of ureteral orifice.        11/19/2015 Imaging    MRI abdomen: 3.2 cm mass in segment 4B of left hepatic lobe, suspicious for liver metastasis. Moderate right hydronephrosis and diffuse renal parenchymal atrophy. Abnormal signal intensity throughout right renal collecting system and proximal right ureter, consistent with known urothelial carcinoma. No other sites of abdominal metastatic disease identified.      12/07/2015 Procedure    Liver biopsy: Poorly differentiated carcinoma consistent with metastatic transitional cell histology.       01/01/2016 Initial Diagnosis    Urothelial carcinoma ([place])     02/04/2016 -  Chemotherapy    atezolizumab (TECENTRIQ) 1,200 mg IV every 21 days         INTERVAL HISTORY:  Ms. Tobey is here for follow-up along with her daughter. She is in a wheelchair. Since her last clinic visit on 02/12/2016 she has had an emergency room visit due to altered mental status was thought to be possibly related to UTI or fluoroquinolones. She also had another visit on 02/04/2016 with orthostatic hypotension. Altered mental status has not resolved and the daughter notes that the patient is back to her baseline. No rashes no diarrhea no other overt prohibitive toxicities from her immunotherapy. She is here for follow-up prior to her  third dose. No abdominal pain. No difficulty passing urine. She notes that she developed some ankle swelling after IV fluids and after her primary put her on some sodium pills. She notes that the sodium pills were decreased in her leg swelling is going down.  REVIEW OF SYSTEMS:    10 Point review of systems of done and is negative except as noted above.  . Past Medical History:  Diagnosis Date  . Arthritis   . Back pain, chronic   . CAD (coronary artery disease)    NSTEMI 04/2011 with newly diagnosed 3V CAD s/p PCI/DES mLAD 04/11/11 with residual dz (per Dr. Burt Knack, should have consideration of PCI of the occluded RCA based on symptoms and/or consideration of outpatient nuclear perfusion testing after the patient recovers from her infarct)  . Cancer ([place])    kidney ( Nov.2017)  . Chronic kidney disease (CKD), stage IV (severe) ([place]) 12/13/2013  . Hyperglycemia   . Hyperlipidemia   . Hypertension   . Hypothyroidism   . Ischemic cardiomyopathy    EF 45% by cath 04/20/11, 50-55% by echo 04/22/11. hypotension limiting medication titration   . Myocardial infarction 2013  . Type 2 diabetes with nephropathy ([place]) 07/26/2014  . UTI (urinary tract infection) 12/13/2013    . Past Surgical History:  Procedure Laterality Date  . BACK SURGERY    . BREAST LUMPECTOMY     right  . CORONARY STENT PLACEMENT    . LEFT HEART CATHETERIZATION WITH CORONARY ANGIOGRAM N/A 04/20/2011   Procedure: LEFT HEART CATHETERIZATION WITH CORONARY ANGIOGRAM;  Surgeon: Burnell Blanks, MD;  Location: [facility]  CATH LAB;  Service: Cardiovascular;  Laterality: N/A;  . PERCUTANEOUS CORONARY STENT INTERVENTION (PCI-S) N/A 04/21/2011   Procedure: PERCUTANEOUS CORONARY STENT INTERVENTION (PCI-S);  Surgeon: Sherren Mocha, MD;  Location: [facility] CATH LAB;  Service: Cardiovascular;  Laterality: N/A;    . Social History  Substance Use Topics  . Smoking status: Never Smoker  . Smokeless tobacco: Never Used  . Alcohol use No     ALLERGIES:  is allergic to ciprofloxacin; lipitor [atorvastatin]; and penicillins.  MEDICATIONS:  Current Outpatient Prescriptions  Medication Sig Dispense Refill  . aspirin 81 MG chewable tablet Chew 81 mg by mouth daily.     Huey Bienenstock (TECENTRIQ IV) Inject into the vein every 21 ( twenty-one) days.    Marland Kitchen docusate sodium (COLACE) 100 MG capsule Take 1 capsule (100 mg total) by mouth 2 (two) times daily. (Patient taking differently: Take 100 mg by mouth daily. ) 60 capsule 0  . fish oil-omega-3 fatty acids 1000 MG capsule Take 1 g by mouth daily.    Marland Kitchen ibuprofen (ADVIL,MOTRIN) 200 MG tablet Take 200 mg by mouth every 6 (six) hours as needed for moderate pain.    Marland Kitchen levothyroxine (SYNTHROID, LEVOTHROID) 112 MCG tablet Take 1 tablet (112 mcg total) by mouth every morning. 30 tablet 0  . meclizine (ANTIVERT) 25 MG tablet Take 25 mg by mouth 4 (four) times daily as needed. Dizziness    . nitroGLYCERIN (NITROSTAT) 0.4 MG SL tablet Place 0.4 mg under the tongue every 5 (five) minutes x 3 doses as needed. For chest pain.    Marland Kitchen ondansetron (ZOFRAN) 8 MG tablet Take 1 tablet (8 mg total) by mouth every 8 (eight) hours as needed for nausea or vomiting. 30 tablet 2  . pantoprazole (PROTONIX) 40 MG tablet Take 1 tablet (40 mg total) by mouth daily. 30 tablet 0  . prochlorperazine (COMPAZINE) 10 MG tablet Take 1 tablet (10 mg total) by mouth every 6 (six) hours as needed for nausea or vomiting. 30 tablet 2  . Vitamin D, Ergocalciferol, (DRISDOL) 50000 UNITS CAPS Take 50,000 Units by mouth 2 (two) times a week. Tuesdays and Thurdays     No current facility-administered medications for this visit.     PHYSICAL EXAMINATION: ECOG PERFORMANCE STATUS: 3 - Symptomatic, >50% confined to bed  . Vitals:   03/24/16 1019  BP: (!) 175/77  Pulse: 90  Resp: 16  Temp: 98.3 F (36.8 C)    Filed Weights   03/24/16 1019  Weight: 157 lb (71.2 kg)   .Body mass index is 28.72  kg/m.  GENERAL:alert,Elderly female in no acute distress and comfortable SKIN: no acute rashes, no significant lesions EYES: conjunctiva are pink and non-injected, sclera anicteric OROPHARYNX: MMM, no exudates, no oropharyngeal erythema or ulceration NECK: supple, no JVD LYMPH:  no palpable lymphadenopathy in the cervical, axillary or inguinal regions LUNGS: clear to auscultation b/l with normal respiratory effort HEART: regular rate & rhythm ABDOMEN:  normoactive bowel sounds , non tender, not distended. Extremity: no pedal edema PSYCH: alert & oriented x 3 with fluent speech NEURO: no focal motor/sensory deficits  LABORATORY DATA:   I have reviewed the data as listed  . CBC Latest Ref Rng & Units 03/24/2016 03/03/2016 02/25/2016  WBC 4.0 - 10.5 K/uL 3.3(L) 4.3 2.9(L)  Hemoglobin 12.0 - 15.0 g/dL 10.7(L) 9.6(L) 9.1(L)  Hematocrit 36.0 - 46.0 % 31.9(L) 28.9(L) 26.1(L)  Platelets 150 - 400 K/uL 226 227 70(L)    . CMP Latest Ref Rng & Units 03/24/2016  03/03/2016 02/25/2016  Glucose 65 - 99 mg/dL 163(H) 176(H) 137(H)  BUN 6 - 20 mg/dL 19 10 20   Creatinine 0.44 - 1.00 mg/dL 1.81(H) 1.58(H) 1.64(H)  Sodium 135 - 145 mmol/L 136 136 135  Potassium 3.5 - 5.1 mmol/L 3.6 3.8 3.4(L)  Chloride 101 - 111 mmol/L 103 106 105  CO2 22 - 32 mmol/L 26 25 23   Calcium 8.9 - 10.3 mg/dL 9.0 8.3(L) 8.0(L)  Total Protein 6.5 - 8.1 g/dL 5.7(L) 5.5(L) -  Total Bilirubin 0.3 - 1.2 mg/dL 0.6 0.6 -  Alkaline Phos 38 - 126 U/L 72 72 -  AST 15 - 41 U/L 21 23 -  ALT 14 - 54 U/L 10(L) 13(L) -     RADIOGRAPHIC STUDIES: I have personally reviewed the radiological images as listed and agreed with the findings in the report. No results found.  ASSESSMENT & PLAN:   Stage IV Urothelial carcinoma, multifocal R ureteral masses with liver metastases No dysuria or hematuria.  Status post recent urinary tract infection which has now resolved. Status post ED visit for altered mental status thought to be related to  UTI or fluoroquinolone. Her daughter she is back to her mental status baseline. Labs are stable kidney function is close to baseline. Platelet counts have improved after they were down from urinary tract infection/antibiotics. Plan -Goal of care were discussed in details. -Patient is aware of the non-curative status of her metastatic malignancy. -She feels she is tolerating treatment okay thus far and would like to continue it. -No rashes no diarrhea liver function tests within normal limits no other overt focal symptoms. -We'll proceed with the third cycle of Atezolizumab today. -Monitoring leg swelling.    Return to clinic in 3 weeks with cycle 4 day 1 of treatment with labs. Earlier if any acute new concerns.   I spent 20 minutes counseling the patient face to face. The total time spent in the appointment was 25 minutes and more than 50% was on counseling and direct patient cares.    Sullivan Lone MD [place] AAHIVMS [facility] Hematology/Oncology Physician [facility]  (Office):       [phone] (Work cell):  [phone] (Fax):           [phone]

## 2016-04-01 ENCOUNTER — Emergency Department (HOSPITAL_COMMUNITY)
Admission: EM | Admit: 2016-04-01 | Discharge: 2016-04-01 | Disposition: A | Discharge status: Home / Self Care | Payer: Medicare Other | Attending: Emergency Medicine | Admitting: Emergency Medicine

## 2016-04-01 ENCOUNTER — Emergency Department (HOSPITAL_COMMUNITY): Payer: Medicare Other

## 2016-04-01 ENCOUNTER — Encounter (HOSPITAL_COMMUNITY): Payer: Self-pay

## 2016-04-01 DIAGNOSIS — S5011XA Contusion of right forearm, initial encounter: Secondary | ICD-10-CM | POA: Insufficient documentation

## 2016-04-01 DIAGNOSIS — Y939 Activity, unspecified: Secondary | ICD-10-CM | POA: Diagnosis not present

## 2016-04-01 DIAGNOSIS — I129 Hypertensive chronic kidney disease with stage 1 through stage 4 chronic kidney disease, or unspecified chronic kidney disease: Secondary | ICD-10-CM | POA: Insufficient documentation

## 2016-04-01 DIAGNOSIS — I2510 Atherosclerotic heart disease of native coronary artery without angina pectoris: Secondary | ICD-10-CM | POA: Diagnosis not present

## 2016-04-01 DIAGNOSIS — S5012XA Contusion of left forearm, initial encounter: Secondary | ICD-10-CM | POA: Diagnosis not present

## 2016-04-01 DIAGNOSIS — E039 Hypothyroidism, unspecified: Secondary | ICD-10-CM | POA: Diagnosis not present

## 2016-04-01 DIAGNOSIS — Z85528 Personal history of other malignant neoplasm of kidney: Secondary | ICD-10-CM | POA: Diagnosis not present

## 2016-04-01 DIAGNOSIS — Z8551 Personal history of malignant neoplasm of bladder: Secondary | ICD-10-CM | POA: Diagnosis not present

## 2016-04-01 DIAGNOSIS — Y999 Unspecified external cause status: Secondary | ICD-10-CM | POA: Diagnosis not present

## 2016-04-01 DIAGNOSIS — Z79899 Other long term (current) drug therapy: Secondary | ICD-10-CM | POA: Insufficient documentation

## 2016-04-01 DIAGNOSIS — Y92009 Unspecified place in unspecified non-institutional (private) residence as the place of occurrence of the external cause: Secondary | ICD-10-CM | POA: Insufficient documentation

## 2016-04-01 DIAGNOSIS — W19XXXA Unspecified fall, initial encounter: Secondary | ICD-10-CM

## 2016-04-01 DIAGNOSIS — S5002XA Contusion of left elbow, initial encounter: Secondary | ICD-10-CM | POA: Insufficient documentation

## 2016-04-01 DIAGNOSIS — Z7982 Long term (current) use of aspirin: Secondary | ICD-10-CM | POA: Diagnosis not present

## 2016-04-01 DIAGNOSIS — W1800XA Striking against unspecified object with subsequent fall, initial encounter: Secondary | ICD-10-CM | POA: Diagnosis not present

## 2016-04-01 DIAGNOSIS — M79601 Pain in right arm: Secondary | ICD-10-CM

## 2016-04-01 DIAGNOSIS — I252 Old myocardial infarction: Secondary | ICD-10-CM | POA: Diagnosis not present

## 2016-04-01 DIAGNOSIS — E1122 Type 2 diabetes mellitus with diabetic chronic kidney disease: Secondary | ICD-10-CM | POA: Insufficient documentation

## 2016-04-01 DIAGNOSIS — N184 Chronic kidney disease, stage 4 (severe): Secondary | ICD-10-CM | POA: Diagnosis not present

## 2016-04-01 DIAGNOSIS — S59902A Unspecified injury of left elbow, initial encounter: Secondary | ICD-10-CM | POA: Diagnosis present

## 2016-04-01 LAB — CBG MONITORING, ED: Glucose-Capillary: 151 mg/dL — ABNORMAL HIGH (ref 65–99)

## 2016-04-01 NOTE — Discharge Instructions (Signed)
Follow-up with her doctor fall precautions as we discussed. Return for any new or worse symptoms. Today's x-rays without any bony injury.

## 2016-04-01 NOTE — ED Triage Notes (Signed)
Patient fell today at home.  States that she was not dizzy, but cannot remember what caused her to fall.  Family states that she was taken off of her blood pressure meds because they were causing hypotension.  States that she has fallen twice this week and was seen by her physician on Tuesday for the fall.  She had hit her head and had multiple bruises on her hands and arms.  MD set her up for physical therapy.

## 2016-04-01 NOTE — ED Provider Notes (Signed)
[facility] DEPT Provider Note   CSN: [id number] Arrival date & time: 04/01/16  1901     History   Chief Complaint Chief Complaint  Patient presents with  . Fall    HPI Joann Hunter is a 83 y.o. female.  Patient brought in by daughter. [place] today at home. Patient states she was not dizzy. Not sure why she fell she thinks she is losing her balance. Patient had a fall earlier this week evaluated by her primary care doctor. They adjusted her blood pressure meds because they were concerned blood pressure was getting too low. Patient's main complaint here today is right forearm and elbow pain. Patient has multiple bruises on both arms but also has sniffing bruising around the left elbow. Patient denies any leg pain. Back pain chest pain shortness of breath. Denies any headache. Family member states no change in mental status. Patient was a primary care doctor is week they did extensive lab work which was normal.      Past Medical History:  Diagnosis Date  . Arthritis   . Back pain, chronic   . CAD (coronary artery disease)    NSTEMI 04/2011 with newly diagnosed 3V CAD s/p PCI/DES mLAD 04/11/11 with residual dz (per Dr. Burt Knack, should have consideration of PCI of the occluded RCA based on symptoms and/or consideration of outpatient nuclear perfusion testing after the patient recovers from her infarct)  . Cancer ([place])    kidney ( Nov.2017)  . Chronic kidney disease (CKD), stage IV (severe) ([place]) 12/13/2013  . Hyperglycemia   . Hyperlipidemia   . Hypertension   . Hypothyroidism   . Ischemic cardiomyopathy    EF 45% by cath 04/20/11, 50-55% by echo 04/22/11. hypotension limiting medication titration   . Myocardial infarction 2013  . Type 2 diabetes with nephropathy ([place]) 07/26/2014  . UTI (urinary tract infection) 12/13/2013    Patient Active Problem List   Diagnosis Date Noted  . Orthostasis 02/24/2016  . Acute encephalopathy 02/24/2016  . Confusion 02/21/2016  . Neutropenia  ([place]) 02/20/2016  . Thrombocytopenia ([place]) 02/20/2016  . ESBL (extended spectrum beta-lactamase) producing bacteria infection 02/20/2016  . Goals of care, counseling/discussion 02/02/2016  . Urothelial cancer ([place]) 01/01/2016  . Rectal bleeding 11/05/2014  . Absolute anemia 11/05/2014  . Hypothyroidism 11/05/2014  . Metabolic encephalopathy 68[birth year]34[birth year]1962  . Type 2 diabetes with nephropathy ([place]) 07/26/2014  . Essential hypertension 07/26/2014  . Hyponatremia 07/25/2014  . Vertigo 07/25/2014  . AKI (acute kidney injury) ([place]) 12/13/2013  . Fall 12/13/2013  . Lower urinary tract infectious disease 12/13/2013  . Chronic kidney disease (CKD), stage IV (severe) ([place]) 12/13/2013  . Chest pain at rest 04/26/2011  . Syncope, near 04/26/2011  . CAD (coronary artery disease) 04/23/2011  . Cardiomyopathy, ischemic 04/23/2011  . Dyslipidemia 04/23/2011  . Myocardial infarction, anterior wall, initial care ([place]) 04/20/2011    Past Surgical History:  Procedure Laterality Date  . BACK SURGERY    . BREAST LUMPECTOMY     right  . CORONARY STENT PLACEMENT    . LEFT HEART CATHETERIZATION WITH CORONARY ANGIOGRAM N/A 04/20/2011   Procedure: LEFT HEART CATHETERIZATION WITH CORONARY ANGIOGRAM;  Surgeon: Burnell Blanks, MD;  Location: [facility] CATH LAB;  Service: Cardiovascular;  Laterality: N/A;  . PERCUTANEOUS CORONARY STENT INTERVENTION (PCI-S) N/A 04/21/2011   Procedure: PERCUTANEOUS CORONARY STENT INTERVENTION (PCI-S);  Surgeon: Sherren Mocha, MD;  Location: [facility] CATH LAB;  Service: Cardiovascular;  Laterality: N/A;    OB History    Gravida Para  Term Preterm AB Living   2 2 2     2    SAB TAB Ectopic Multiple Live Births                   Home Medications    Prior to Admission medications   Medication Sig Start Date End Date Taking? Authorizing Provider  aspirin 81 MG chewable tablet Chew 81 mg by mouth daily.    Yes Historical Provider, MD  Atezolizumab (TECENTRIQ IV) Inject into the vein  every 21 ( twenty-one) days.   Yes Historical Provider, MD  docusate sodium (COLACE) 100 MG capsule Take 1 capsule (100 mg total) by mouth 2 (two) times daily. Patient taking differently: Take 100 mg by mouth daily.  11/06/14  Yes Kathie Dike, MD  fish oil-omega-3 fatty acids 1000 MG capsule Take 1 g by mouth daily.   Yes Historical Provider, MD  ibuprofen (ADVIL,MOTRIN) 200 MG tablet Take 200 mg by mouth every 6 (six) hours as needed for moderate pain.   Yes Historical Provider, MD  levothyroxine (SYNTHROID, LEVOTHROID) 112 MCG tablet Take 1 tablet (112 mcg total) by mouth every morning. 12/16/13  Yes Verlee Monte, MD  meclizine (ANTIVERT) 25 MG tablet Take 25 mg by mouth 4 (four) times daily as needed. Dizziness   Yes Historical Provider, MD  nitroGLYCERIN (NITROSTAT) 0.4 MG SL tablet Place 0.4 mg under the tongue every 5 (five) minutes x 3 doses as needed. For chest pain. 04/23/11  Yes Dayna N Dunn, PA-C  ondansetron (ZOFRAN) 8 MG tablet Take 1 tablet (8 mg total) by mouth every 8 (eight) hours as needed for nausea or vomiting. 01/29/16  Yes Patrici Ranks, MD  pantoprazole (PROTONIX) 40 MG tablet Take 1 tablet (40 mg total) by mouth daily. 11/06/14  Yes Kathie Dike, MD  prochlorperazine (COMPAZINE) 10 MG tablet Take 1 tablet (10 mg total) by mouth every 6 (six) hours as needed for nausea or vomiting. 01/29/16  Yes Patrici Ranks, MD  Vitamin D, Ergocalciferol, (DRISDOL) 50000 UNITS CAPS Take 50,000 Units by mouth 2 (two) times a week. Tuesdays and Thurdays 05/30/12  Yes Historical Provider, MD    Family History Family History  Problem Relation Age of Onset  . Other Mother   . Cancer Father   . Other      no known family cardiac disease  . Other Brother     Diptheria  . Depression Sister   . Pneumonia Sister   . Other Brother     murdered    Social History Social History  Substance Use Topics  . Smoking status: Never Smoker  . Smokeless tobacco: Never Used  . Alcohol use No      Allergies   Ciprofloxacin; Lipitor [atorvastatin]; and Penicillins   Review of Systems Review of Systems  Constitutional: Negative for fever.  HENT: Negative for congestion.   Eyes: Negative for redness.  Respiratory: Negative for shortness of breath.   Cardiovascular: Negative for chest pain.  Gastrointestinal: Negative for abdominal pain.  Musculoskeletal: Negative for back pain and neck pain.  Neurological: Negative for dizziness, weakness and headaches.  Hematological: Bruises/bleeds easily.  Psychiatric/Behavioral: Negative for confusion.     Physical Exam Updated Vital Signs BP (!) 202/100 (BP Location: Left Arm)   Pulse 92   Temp 98.8 F (37.1 C) (Oral)   Resp 20   Ht 5\' 2"  (1.575 m)   SpO2 99%   Physical Exam  Constitutional: She is oriented to person, place, and  time. She appears well-developed and well-nourished. No distress.  HENT:  Head: Atraumatic.  Mouth/Throat: Oropharynx is clear and moist.  Eyes: Conjunctivae and EOM are normal. Pupils are equal, round, and reactive to light.  Neck: Normal range of motion. Neck supple.  Cardiovascular: Normal rate, regular rhythm and normal heart sounds.   Pulmonary/Chest: Effort normal and breath sounds normal. No respiratory distress.  Abdominal: Bowel sounds are normal. There is no tenderness.  Musculoskeletal: Normal range of motion. She exhibits no tenderness or deformity.  Sniffing of bruising around the left elbow area. Scattered bruising on both forearms and hands. Right upper extremity without without any point tenderness good range of motion of the fingers and wrist elbow and shoulder. No deformities. Radial pulse is 1+.  Neurological: She is alert and oriented to person, place, and time. No cranial nerve deficit or sensory deficit. She exhibits normal muscle tone. Coordination normal.  Skin: Skin is warm.  GERD bruising throughout both upper extremities and hands.  Nursing note and vitals  reviewed.    ED Treatments / Results  Labs (all labs ordered are listed, but only abnormal results are displayed) Labs Reviewed  CBG MONITORING, ED - Abnormal; Notable for the following:       Result Value   Glucose-Capillary 151 (*)    All other components within normal limits    EKG  EKG Interpretation None       Radiology Dg Elbow Complete Right  Result Date: 04/01/2016 CLINICAL DATA:  Patient fell today at home. States that she was not dizzy, but cannot remember what caused her to fall. Family states that she was taken off of her blood pressure meds because they were causing hypotension. States that she has fallen twice this week and was seen by her physician on Tuesday for the fall. She had hit her head and had multiple bruises on her hands and arms. EXAM: RIGHT ELBOW - COMPLETE 3+ VIEW COMPARISON:  None. FINDINGS: No fracture. The elbow joint is normally spaced and aligned. No significant arthropathic change. No joint effusion. There is mild posterior subcutaneous soft tissue edema. IMPRESSION: No fracture or dislocation. Electronically Signed   By: Lajean Manes M.D.   On: 04/01/2016 20:41   Dg Forearm Right  Result Date: 04/01/2016 CLINICAL DATA:  Patient fell today at home. States that she was not dizzy, but cannot remember what caused her to fall. Family states that she was taken off of her blood pressure meds because they were causing hypotension. States that she has fallen twice this week and was seen by her physician on Tuesday for the fall. She had hit her head and had multiple bruises on her hands and arms. EXAM: RIGHT FOREARM - 2 VIEW COMPARISON:  None. FINDINGS: No fracture.  No bone lesion.  The bones are demineralized. The elbow and wrist joints are normally aligned. The soft tissues are unremarkable. IMPRESSION: No fracture, bone lesion or dislocation. Electronically Signed   By: Lajean Manes M.D.   On: 04/01/2016 20:41    Procedures Procedures (including critical  care time)  Medications Ordered in ED Medications - No data to display   Initial Impression / Assessment and Plan / ED Course  I have reviewed the triage vital signs and the nursing notes.  Pertinent labs & imaging results that were available during my care of the patient were reviewed by me and considered in my medical decision making (see chart for details).     The patient with a history of  increased falls recently. Seen by her primary care doctor this week. Had lab work done without any significant abnormalities. Earlier in the week patient did hit her head. It is been no mental status changes since then no complaint of headache. Today with the fall the complaint was just right arm pain. X-rays are negative. Patient's blood pressure medicine was adjusted by primary care doctor this week as there was some concern blood pressure was too low. Blood pressures fine here today blood sugars fine here today. Patient's alert. Neuro exam without any focal deficits. Patient stable for discharge home follow-up with primary care doctor. They have arranged for physical therapy to come the house. Patient does have a walker at home although she chooses not to use it. We had long discussion importance of avoiding falls.  Final Clinical Impressions(s) / ED Diagnoses   Final diagnoses:  Fall, initial encounter  Pain of right upper extremity    New Prescriptions New Prescriptions   No medications on file     Fredia Sorrow, MD 04/01/16 2246

## 2016-04-01 NOTE — ED Notes (Signed)
Went in to introduce myself to patient and family member to update them on the plan of care. Made family and pt aware that she would go to xray but pt had already been. Advised that we would be waiting on results.

## 2016-04-05 ENCOUNTER — Encounter (HOSPITAL_COMMUNITY): Payer: Medicare Other | Attending: Oncology

## 2016-04-05 ENCOUNTER — Other Ambulatory Visit (HOSPITAL_COMMUNITY): Payer: Self-pay | Admitting: Emergency Medicine

## 2016-04-05 ENCOUNTER — Other Ambulatory Visit (HOSPITAL_COMMUNITY): Payer: Self-pay | Admitting: Oncology

## 2016-04-05 DIAGNOSIS — I255 Ischemic cardiomyopathy: Secondary | ICD-10-CM | POA: Diagnosis not present

## 2016-04-05 DIAGNOSIS — R51 Headache: Secondary | ICD-10-CM | POA: Insufficient documentation

## 2016-04-05 DIAGNOSIS — E039 Hypothyroidism, unspecified: Secondary | ICD-10-CM | POA: Diagnosis not present

## 2016-04-05 DIAGNOSIS — E1122 Type 2 diabetes mellitus with diabetic chronic kidney disease: Secondary | ICD-10-CM | POA: Insufficient documentation

## 2016-04-05 DIAGNOSIS — E785 Hyperlipidemia, unspecified: Secondary | ICD-10-CM | POA: Insufficient documentation

## 2016-04-05 DIAGNOSIS — M1990 Unspecified osteoarthritis, unspecified site: Secondary | ICD-10-CM | POA: Diagnosis not present

## 2016-04-05 DIAGNOSIS — C689 Malignant neoplasm of urinary organ, unspecified: Secondary | ICD-10-CM

## 2016-04-05 DIAGNOSIS — G8929 Other chronic pain: Secondary | ICD-10-CM | POA: Insufficient documentation

## 2016-04-05 DIAGNOSIS — Z955 Presence of coronary angioplasty implant and graft: Secondary | ICD-10-CM | POA: Diagnosis not present

## 2016-04-05 DIAGNOSIS — N184 Chronic kidney disease, stage 4 (severe): Secondary | ICD-10-CM | POA: Diagnosis not present

## 2016-04-05 DIAGNOSIS — C787 Secondary malignant neoplasm of liver and intrahepatic bile duct: Secondary | ICD-10-CM | POA: Insufficient documentation

## 2016-04-05 DIAGNOSIS — Z7982 Long term (current) use of aspirin: Secondary | ICD-10-CM | POA: Diagnosis not present

## 2016-04-05 DIAGNOSIS — I2510 Atherosclerotic heart disease of native coronary artery without angina pectoris: Secondary | ICD-10-CM | POA: Diagnosis not present

## 2016-04-05 DIAGNOSIS — I252 Old myocardial infarction: Secondary | ICD-10-CM | POA: Diagnosis not present

## 2016-04-05 DIAGNOSIS — I129 Hypertensive chronic kidney disease with stage 1 through stage 4 chronic kidney disease, or unspecified chronic kidney disease: Secondary | ICD-10-CM | POA: Insufficient documentation

## 2016-04-05 DIAGNOSIS — C669 Malignant neoplasm of unspecified ureter: Secondary | ICD-10-CM | POA: Insufficient documentation

## 2016-04-05 DIAGNOSIS — R42 Dizziness and giddiness: Secondary | ICD-10-CM | POA: Diagnosis not present

## 2016-04-05 DIAGNOSIS — C688 Malignant neoplasm of overlapping sites of urinary organs: Secondary | ICD-10-CM | POA: Diagnosis present

## 2016-04-05 LAB — COMPREHENSIVE METABOLIC PANEL
ALBUMIN: 2.7 g/dL — AB (ref 3.5–5.0)
ALK PHOS: 100 U/L (ref 38–126)
ALT: 10 U/L — AB (ref 14–54)
AST: 20 U/L (ref 15–41)
Anion gap: 7 (ref 5–15)
BILIRUBIN TOTAL: 0.4 mg/dL (ref 0.3–1.2)
BUN: 21 mg/dL — ABNORMAL HIGH (ref 6–20)
CALCIUM: 8.5 mg/dL — AB (ref 8.9–10.3)
CHLORIDE: 106 mmol/L (ref 101–111)
CO2: 25 mmol/L (ref 22–32)
CREATININE: 1.88 mg/dL — AB (ref 0.44–1.00)
GFR calc Af Amer: 28 mL/min — ABNORMAL LOW (ref >60)
GFR calc non Af Amer: 24 mL/min — ABNORMAL LOW (ref >60)
GLUCOSE: 212 mg/dL — AB (ref 65–99)
Potassium: 3.7 mmol/L (ref 3.5–5.1)
Sodium: 138 mmol/L (ref 135–145)
Total Protein: 5.5 g/dL — ABNORMAL LOW (ref 6.5–8.1)

## 2016-04-05 LAB — CBC WITH DIFFERENTIAL/PLATELET
BASOS ABS: 0 10*3/uL (ref 0.0–0.1)
Basophils Relative: 1 %
EOS PCT: 3 %
Eosinophils Absolute: 0.1 10*3/uL (ref 0.0–0.7)
HCT: 32.7 % — ABNORMAL LOW (ref 36.0–46.0)
Hemoglobin: 11 g/dL — ABNORMAL LOW (ref 12.0–15.0)
LYMPHS PCT: 29 %
Lymphs Abs: 1.2 10*3/uL (ref 0.7–4.0)
MCH: 32 pg (ref 26.0–34.0)
MCHC: 33.6 g/dL (ref 30.0–36.0)
MCV: 95.1 fL (ref 78.0–100.0)
MONO ABS: 0.6 10*3/uL (ref 0.1–1.0)
Monocytes Relative: 14 %
Neutro Abs: 2.2 10*3/uL (ref 1.7–7.7)
Neutrophils Relative %: 53 %
PLATELETS: 161 10*3/uL (ref 150–400)
RBC: 3.44 MIL/uL — ABNORMAL LOW (ref 3.87–5.11)
RDW: 14 % (ref 11.5–15.5)
WBC: 4.1 10*3/uL (ref 4.0–10.5)

## 2016-04-05 NOTE — Progress Notes (Unsigned)
pts daughter Joann Hunter called and stated that Joann Hunter is very weak and has fallen twice last week.  She does not have an appetite.  The daughter had called the triage nurse line last night and they had told her to go to the ER but she refused saying they did not help her the last time.  The nurse then told her to call to see if she could get blood work done today.  I made her an appt at 3:00 pm today and put in a CBC and CMET.

## 2016-04-07 ENCOUNTER — Encounter (HOSPITAL_BASED_OUTPATIENT_CLINIC_OR_DEPARTMENT_OTHER): Payer: Medicare Other

## 2016-04-07 DIAGNOSIS — N184 Chronic kidney disease, stage 4 (severe): Secondary | ICD-10-CM

## 2016-04-07 DIAGNOSIS — R4182 Altered mental status, unspecified: Secondary | ICD-10-CM | POA: Diagnosis not present

## 2016-04-07 DIAGNOSIS — C689 Malignant neoplasm of urinary organ, unspecified: Secondary | ICD-10-CM

## 2016-04-07 MED ORDER — SODIUM CHLORIDE 0.9 % IV SOLN
INTRAVENOUS | Status: DC
  Administered 2016-04-07: 12:00:00 via INTRAVENOUS

## 2016-04-07 NOTE — Patient Instructions (Signed)
[place] at [facility] Discharge Instructions  RECOMMENDATIONS MADE BY THE CONSULTANT AND ANY TEST RESULTS WILL BE SENT TO YOUR REFERRING PHYSICIAN.  Hydration fluids given today as ordered. Be sure to keep appointment with primary care regarding blood pressure. Return as scheduled.  Thank you for choosing [place] at [facility] to provide your oncology and hematology care.  To afford each patient quality time with our provider, please arrive at least 15 minutes before your scheduled appointment time.    If you have a lab appointment with the [facility] please come in thru the  Main Entrance and check in at the main information desk  You need to re-schedule your appointment should you arrive 10 or more minutes late.  We strive to give you quality time with our providers, and arriving late affects you and other patients whose appointments are after yours.  Also, if you no show three or more times for appointments you may be dismissed from the clinic at the providers discretion.     Again, thank you for choosing [facility].  Our hope is that these requests will decrease the amount of time that you wait before being seen by our physicians.       _____________________________________________________________  Should you have questions after your visit to [facility], please contact our office at [phone] between the hours of 8:30 a.m. and 4:30 p.m.  Voicemails left after 4:30 p.m. will not be returned until the following business day.  For prescription refill requests, have your pharmacy contact our office.       Resources For Cancer Patients and their Caregivers ? American Cancer Society: Can assist with transportation, wigs, general needs, runs Look Good Feel Better.        574-326-9035 ? Cancer Care: Provides financial assistance, online support groups, medication/co-pay assistance.  1-800-813-HOPE  559-030-1798) ? [place] Assists [place] cancer patients and their families through emotional , educational and financial support.  (484) 200-0414 ? [place] DSS Where to apply for food stamps, Medicaid and utility assistance. (959) 159-1838 ? RCATS: Transportation to medical appointments. 856-143-3823 ? Social Security Administration: May apply for disability if have a Stage IV cancer. 585-092-6373 [phone] ? [employer], Disability and Transit Services: Assists with nutrition, care and transit needs. [place] Support Programs: @10RELATIVEDAYS @ > Cancer Support Group  2nd Tuesday of the month 1pm-2pm, Journey Room  > Creative Journey  3rd Tuesday of the month 1130am-1pm, Journey Room  > Look Good Feel Better  1st Wednesday of the month 10am-12 noon, Journey Room (Call [place] to register [phone])

## 2016-04-07 NOTE — Progress Notes (Signed)
Tolerated hydration fluids well. Per daughter patient has an appointment with primary care Dr.Shah on Monday to re-evaluate blood pressure. Patient has been off all blood pressure medicine x 5-6 weeks as instructed ED/in-patient physician.  Stable on discharge home with daughter via wheelchair.

## 2016-04-14 ENCOUNTER — Encounter (HOSPITAL_BASED_OUTPATIENT_CLINIC_OR_DEPARTMENT_OTHER): Payer: Medicare Other | Admitting: Adult Health

## 2016-04-14 ENCOUNTER — Encounter (HOSPITAL_COMMUNITY): Payer: Self-pay

## 2016-04-14 ENCOUNTER — Ambulatory Visit (HOSPITAL_COMMUNITY)
Admission: RE | Admit: 2016-04-14 | Discharge: 2016-04-14 | Disposition: A | Discharge status: Home / Self Care | Payer: Medicare Other | Source: Ambulatory Visit | Attending: Internal Medicine | Admitting: Internal Medicine

## 2016-04-14 ENCOUNTER — Encounter (HOSPITAL_COMMUNITY): Payer: Self-pay | Admitting: Adult Health

## 2016-04-14 ENCOUNTER — Encounter (HOSPITAL_BASED_OUTPATIENT_CLINIC_OR_DEPARTMENT_OTHER): Payer: Medicare Other

## 2016-04-14 ENCOUNTER — Other Ambulatory Visit (HOSPITAL_COMMUNITY): Payer: Self-pay | Admitting: Internal Medicine

## 2016-04-14 ENCOUNTER — Encounter (HOSPITAL_COMMUNITY): Payer: Medicare Other

## 2016-04-14 VITALS — BP 163/54 | HR 51 | Temp 98.5°F | Resp 16 | Wt 161.3 lb

## 2016-04-14 VITALS — BP 165/55 | HR 66 | Resp 16

## 2016-04-14 DIAGNOSIS — C689 Malignant neoplasm of urinary organ, unspecified: Secondary | ICD-10-CM

## 2016-04-14 DIAGNOSIS — R42 Dizziness and giddiness: Secondary | ICD-10-CM

## 2016-04-14 DIAGNOSIS — N184 Chronic kidney disease, stage 4 (severe): Secondary | ICD-10-CM | POA: Diagnosis not present

## 2016-04-14 DIAGNOSIS — C787 Secondary malignant neoplasm of liver and intrahepatic bile duct: Secondary | ICD-10-CM

## 2016-04-14 DIAGNOSIS — C669 Malignant neoplasm of unspecified ureter: Secondary | ICD-10-CM | POA: Diagnosis not present

## 2016-04-14 DIAGNOSIS — R0602 Shortness of breath: Secondary | ICD-10-CM

## 2016-04-14 DIAGNOSIS — E860 Dehydration: Secondary | ICD-10-CM | POA: Insufficient documentation

## 2016-04-14 DIAGNOSIS — R51 Headache: Secondary | ICD-10-CM

## 2016-04-14 DIAGNOSIS — I10 Essential (primary) hypertension: Secondary | ICD-10-CM | POA: Diagnosis not present

## 2016-04-14 DIAGNOSIS — R519 Headache, unspecified: Secondary | ICD-10-CM

## 2016-04-14 DIAGNOSIS — R5383 Other fatigue: Secondary | ICD-10-CM

## 2016-04-14 LAB — COMPREHENSIVE METABOLIC PANEL
ALT: 9 U/L — AB (ref 14–54)
AST: 16 U/L (ref 15–41)
Albumin: 2.5 g/dL — ABNORMAL LOW (ref 3.5–5.0)
Alkaline Phosphatase: 98 U/L (ref 38–126)
Anion gap: 4 — ABNORMAL LOW (ref 5–15)
BILIRUBIN TOTAL: 0.3 mg/dL (ref 0.3–1.2)
BUN: 17 mg/dL (ref 6–20)
CALCIUM: 8.2 mg/dL — AB (ref 8.9–10.3)
CO2: 26 mmol/L (ref 22–32)
CREATININE: 1.73 mg/dL — AB (ref 0.44–1.00)
Chloride: 105 mmol/L (ref 101–111)
GFR calc Af Amer: 30 mL/min — ABNORMAL LOW (ref >60)
GFR, EST NON AFRICAN AMERICAN: 26 mL/min — AB (ref >60)
Glucose, Bld: 148 mg/dL — ABNORMAL HIGH (ref 65–99)
Potassium: 3.4 mmol/L — ABNORMAL LOW (ref 3.5–5.1)
Sodium: 135 mmol/L (ref 135–145)
TOTAL PROTEIN: 5.2 g/dL — AB (ref 6.5–8.1)

## 2016-04-14 LAB — CBC WITH DIFFERENTIAL/PLATELET
BASOS ABS: 0 10*3/uL (ref 0.0–0.1)
Basophils Relative: 1 %
Eosinophils Absolute: 0.2 10*3/uL (ref 0.0–0.7)
Eosinophils Relative: 5 %
HEMATOCRIT: 30 % — AB (ref 36.0–46.0)
Hemoglobin: 10.1 g/dL — ABNORMAL LOW (ref 12.0–15.0)
LYMPHS ABS: 0.8 10*3/uL (ref 0.7–4.0)
LYMPHS PCT: 23 %
MCH: 32.1 pg (ref 26.0–34.0)
MCHC: 33.7 g/dL (ref 30.0–36.0)
MCV: 95.2 fL (ref 78.0–100.0)
Monocytes Absolute: 0.4 10*3/uL (ref 0.1–1.0)
Monocytes Relative: 10 %
NEUTROS ABS: 2.1 10*3/uL (ref 1.7–7.7)
Neutrophils Relative %: 61 %
Platelets: 171 10*3/uL (ref 150–400)
RBC: 3.15 MIL/uL — AB (ref 3.87–5.11)
RDW: 13.7 % (ref 11.5–15.5)
WBC: 3.5 10*3/uL — ABNORMAL LOW (ref 4.0–10.5)

## 2016-04-14 LAB — TSH: TSH: 0.397 u[IU]/mL (ref 0.350–4.500)

## 2016-04-14 MED ORDER — SODIUM CHLORIDE 0.9 % IV SOLN
Freq: Once | INTRAVENOUS | Status: AC
  Administered 2016-04-14: 12:00:00 via INTRAVENOUS

## 2016-04-14 NOTE — Patient Instructions (Addendum)
[place] at [facility] Discharge Instructions  RECOMMENDATIONS MADE BY THE CONSULTANT AND ANY TEST RESULTS WILL BE SENT TO YOUR REFERRING PHYSICIAN.  You were seen today by Mike Craze NP. Return on Mondays and Thursdays for IV fluids for two weeks. CT scan soon. Return in 3 weeks for labs and treatment.      Thank you for choosing [place] at [facility] to provide your oncology and hematology care.  To afford each patient quality time with our provider, please arrive at least 15 minutes before your scheduled appointment time.    If you have a lab appointment with the [facility] please come in thru the  Main Entrance and check in at the main information desk  You need to re-schedule your appointment should you arrive 10 or more minutes late.  We strive to give you quality time with our providers, and arriving late affects you and other patients whose appointments are after yours.  Also, if you no show three or more times for appointments you may be dismissed from the clinic at the providers discretion.     Again, thank you for choosing [facility].  Our hope is that these requests will decrease the amount of time that you wait before being seen by our physicians.       _____________________________________________________________  Should you have questions after your visit to [facility], please contact our office at [phone] between the hours of 8:30 a.m. and 4:30 p.m.  Voicemails left after 4:30 p.m. will not be returned until the following business day.  For prescription refill requests, have your pharmacy contact our office.       Resources For Cancer Patients and their Caregivers ? American Cancer Society: Can assist with transportation, wigs, general needs, runs Look Good Feel Better.        613-440-7176 ? Cancer Care: Provides financial assistance, online support groups,  medication/co-pay assistance.  1-800-813-HOPE 6815604316) ? [place] Assists [place] cancer patients and their families through emotional , educational and financial support.  (760)779-2099 ? [place] DSS Where to apply for food stamps, Medicaid and utility assistance. (671)515-3795 ? RCATS: Transportation to medical appointments. 2096282463 ? Social Security Administration: May apply for disability if have a Stage IV cancer. 779-618-3027 [phone] ? [employer], Disability and Transit Services: Assists with nutrition, care and transit needs. [place] Support Programs: @10RELATIVEDAYS @ > Cancer Support Group  2nd Tuesday of the month 1pm-2pm, Journey Room  > Creative Journey  3rd Tuesday of the month 1130am-1pm, Journey Room  > Look Good Feel Better  1st Wednesday of the month 10am-12 noon, Journey Room (Call [place] to register [phone])

## 2016-04-14 NOTE — Progress Notes (Signed)
No treatment today per MD, hydration fluids given per orders, vitals stable and discharged home from clinic via wheelchair. Follow up as scheduled.

## 2016-04-14 NOTE — Patient Instructions (Signed)
[place] at [facility] Discharge Instructions  RECOMMENDATIONS MADE BY THE CONSULTANT AND ANY TEST RESULTS WILL BE SENT TO YOUR REFERRING PHYSICIAN.  One unit of fluids given today Follow up as scheduled  Thank you for choosing [place] at [facility] to provide your oncology and hematology care.  To afford each patient quality time with our provider, please arrive at least 15 minutes before your scheduled appointment time.    If you have a lab appointment with the [facility] please come in thru the  Main Entrance and check in at the main information desk  You need to re-schedule your appointment should you arrive 10 or more minutes late.  We strive to give you quality time with our providers, and arriving late affects you and other patients whose appointments are after yours.  Also, if you no show three or more times for appointments you may be dismissed from the clinic at the providers discretion.     Again, thank you for choosing [facility].  Our hope is that these requests will decrease the amount of time that you wait before being seen by our physicians.       _____________________________________________________________  Should you have questions after your visit to [facility], please contact our office at [phone] between the hours of 8:30 a.m. and 4:30 p.m.  Voicemails left after 4:30 p.m. will not be returned until the following business day.  For prescription refill requests, have your pharmacy contact our office.       Resources For Cancer Patients and their Caregivers ? American Cancer Society: Can assist with transportation, wigs, general needs, runs Look Good Feel Better.        617-277-2750 ? Cancer Care: Provides financial assistance, online support groups, medication/co-pay assistance.  1-800-813-HOPE 614 237 8257) ? [place] Assists [place] cancer  patients and their families through emotional , educational and financial support.  (586)307-0845 ? [place] DSS Where to apply for food stamps, Medicaid and utility assistance. 802-275-8912 ? RCATS: Transportation to medical appointments. (281) 863-3329 ? Social Security Administration: May apply for disability if have a Stage IV cancer. 717-075-0900 571-730-1099 ? [employer], Disability and Transit Services: Assists with nutrition, care and transit needs. [place] Support Programs: @10RELATIVEDAYS @ > Cancer Support Group  2nd Tuesday of the month 1pm-2pm, Journey Room  > Creative Journey  3rd Tuesday of the month 1130am-1pm, Journey Room  > Look Good Feel Better  1st Wednesday of the month 10am-12 noon, Journey Room (Call [place] to register [phone])

## 2016-04-14 NOTE — Progress Notes (Signed)
[place], [place] [address]   CLINIC:  Medical Oncology/Hematology  [place]P:  [place] [address] [place] [address] [phone]   REASON FOR VISIT:  Follow-up for Stage IV Urothelial Carcinoma with liver mets  CURRENT THERAPY: Tecentriq IV every 21 days, beginning on 02/04/16   BRIEF ONCOLOGIC HISTORY:    Urothelial cancer ([place])   10/22/2015 Imaging    Presented with recurrent UTI and hematuria. Underwent cystoscopy and retrograde evaluation with Dr. Exie Hunter revealed multiple filling defects in distal (R) ureter. Marked irregularly of proximal (R) ureter concerning for malignancy. (+) hydronephrosis noted and tumor noted out of ureteral orifice.        11/19/2015 Imaging    MRI abdomen: 3.2 cm mass in segment 4B of left hepatic lobe, suspicious for liver metastasis. Moderate right hydronephrosis and diffuse renal parenchymal atrophy. Abnormal signal intensity throughout right renal collecting system and proximal right ureter, consistent with known urothelial carcinoma. No other sites of abdominal metastatic disease identified.      12/07/2015 Procedure    Liver biopsy: Poorly differentiated carcinoma consistent with metastatic transitional cell histology.       01/01/2016 Initial Diagnosis    Urothelial carcinoma ([place])     02/04/2016 -  Chemotherapy    atezolizumab (TECENTRIQ) 1,200 mg IV every 21 days         HISTORY OF PRESENT ILLNESS (From Dr. Donald Hunter note on 01/27/2016):  Joann Hunter 83 y.o. femaleis here because of referral from Dr. Alen Hunter for ureteral cancer. The patient lives closer to [facility] and felt it would be more convenient for her and her family to follow-up in [place].  Per Dr. Hazeline Hunter note on 12/18/2015: "83 year old woman with history of hypertension, hyperlipidemia and coronary artery disease. She has been noted to have recurrent UTI and hematuria but subsequently underwent a  cystoscopy and retrograde evaluation by Dr. Exie Hunter in October 2017. Retrograde evaluation showed multiple filling defects in the distal right ureter. There is marked irregularity of the proximal right ureter specialist for malignant process. She also had an ultrasound which showed hydronephrosis and her cystoscopy on 10/22/2015 showed a tumor noted out of the ureteral orifice. MRI of the abdomen obtained on 11/19/2015 showed a 3.2 cm mass of the left hepatic lobe suspicious for liver metastasis. Moderate right hydronephrosis was also noted. No other sites of abdominal metastasis noted. She underwent a biopsy of her liver lesion on 12/07/2015 which showed metastatic carcinoma consistent with transitional cell histology. She was referred to me for evaluation regarding these findings. Clinically, she is asymptomatic from this. She is no longer reporting any hematuria, dysuria or abdominal pain. She does not report any flank pain. Her performance status is limited related to arthritis. She ambulates short distances inside her house. She relies on her daughter for most activities of daily living."      INTERVAL HISTORY:  [name] presents today for follow-up for Stage IV urothelial carcinoma and consideration for next cycle of Tecentriq.  She is seen in a wheelchair, accompanied by her daughter today.    She feels very fatigued. She's had 2 falls since her last visit to the cancer center.  The most recent fall resulted in the ED visit on 04/01/16; she tells me she does not remember the fall-she thinks she may have passed out.  She did not hit her head; she had pain to her right arm/elbow, which is improved.  She does bruise/bleed very  easily.   She has occasional headache, which is improved with ibuprofen. She feels dizzy very often. Her daughter shared with me that she did get IVF recently, which helped "perked her up a little bit."  Her appetite has not been very good; her daughter states that she has not  been eating well since the fall but has improved in the recent days.    Denies rash, abdominal pain, diarrhea, N&V, cough, or shortness of breath.  Denies mucositis. No dysuria or hematuria noted.     REVIEW OF SYSTEMS:  Review of Systems  Constitutional: Positive for appetite change and fatigue. Negative for chills, fever and unexpected weight change.  HENT:  Negative.  Negative for mouth sores.   Eyes: Negative.   Respiratory: Negative.  Negative for cough and shortness of breath.   Cardiovascular: Positive for leg swelling.  Gastrointestinal: Negative for abdominal pain, blood in stool, constipation, diarrhea, nausea and vomiting.  Endocrine: Negative.   Genitourinary: Negative.  Negative for dysuria, hematuria and vaginal bleeding.   Musculoskeletal: Positive for arthralgias (chronic arthritis ) and back pain.  Skin: Negative.  Negative for rash.  Neurological: Positive for dizziness and headaches.  Hematological: Bruises/bleeds easily.  Psychiatric/Behavioral: Negative.  Negative for sleep disturbance.  All other systems reviewed and are negative.   PAST MEDICAL/SURGICAL HISTORY:  Past Medical History:  Diagnosis Date  . Arthritis   . Back pain, chronic   . CAD (coronary artery disease)    NSTEMI 04/2011 with newly diagnosed 3V CAD s/p PCI/DES mLAD 04/11/11 with residual dz (per Dr. Burt Hunter, should have consideration of PCI of the occluded RCA based on symptoms and/or consideration of outpatient nuclear perfusion testing after the patient recovers from her infarct)  . Cancer ([place])    kidney ( Nov.2017)  . Chronic kidney disease (CKD), stage IV (severe) ([place]) 12/13/2013  . Hyperglycemia   . Hyperlipidemia   . Hypertension   . Hypothyroidism   . Ischemic cardiomyopathy    EF 45% by cath 04/20/11, 50-55% by echo 04/22/11. hypotension limiting medication titration   . Myocardial infarction 2013  . Type 2 diabetes with nephropathy ([place]) 07/26/2014  . UTI (urinary tract infection)  12/13/2013   Past Surgical History:  Procedure Laterality Date  . BACK SURGERY    . BREAST LUMPECTOMY     right  . CORONARY STENT PLACEMENT    . LEFT HEART CATHETERIZATION WITH CORONARY ANGIOGRAM N/A 04/20/2011   Procedure: LEFT HEART CATHETERIZATION WITH CORONARY ANGIOGRAM;  Surgeon: Burnell Blanks, MD;  Location[facility] CATH LAB;  Service: Cardiovascular;  Laterality: N/A;  . PERCUTANEOUS CORONARY STENT INTERVENTION (PCI-S) N/A 04/21/2011   Procedure: PERCUTANEOUS CORONARY STENT INTERVENTION (PCI-S);  Surgeon: Sherren Mocha, MD;  Location: [facility] CATH LAB;  Service: Cardiovascular;  Laterality: N/A;     SOCIAL HISTORY:  Social History   Social History  . Marital status: Married    Spouse name: N/A  . Number of children: N/A  . Years of education: N/A   Occupational History  . Not on file.   Social History Main Topics  . Smoking status: Never Smoker  . Smokeless tobacco: Never Used  . Alcohol use No  . Drug use: No  . Sexual activity: No     Comment: married 61 years-husband at [employer]   Other Topics Concern  . Not on file   Social History Narrative   Lives in [place], [place] with her husband and daughter.    FAMILY HISTORY:  Family History  Problem Relation  Age of Onset  . Other Mother   . Cancer Father   . Other      no known family cardiac disease  . Other Brother     Diptheria  . Depression Sister   . Pneumonia Sister   . Other Brother     murdered    CURRENT MEDICATIONS:  Outpatient Encounter Prescriptions as of 04/14/2016  Medication Sig Note  . aspirin 81 MG chewable tablet Chew 81 mg by mouth daily.    Huey Bienenstock (TECENTRIQ IV) Inject into the vein every 21 ( twenty-one) days. 02/20/2016: Due for injection 02/25/2016  . docusate sodium (COLACE) 100 MG capsule Take 1 capsule (100 mg total) by mouth 2 (two) times daily. (Patient taking differently: Take 100 mg by mouth daily. )   . fish oil-omega-3 fatty acids 1000 MG capsule Take 1 g by mouth daily.    Marland Kitchen ibuprofen (ADVIL,MOTRIN) 200 MG tablet Take 200 mg by mouth every 6 (six) hours as needed for moderate pain.   Marland Kitchen levothyroxine (SYNTHROID, LEVOTHROID) 112 MCG tablet Take 1 tablet (112 mcg total) by mouth every morning.   . meclizine (ANTIVERT) 25 MG tablet Take 25 mg by mouth 4 (four) times daily as needed. Dizziness   . nitroGLYCERIN (NITROSTAT) 0.4 MG SL tablet Place 0.4 mg under the tongue every 5 (five) minutes x 3 doses as needed. For chest pain. 04/04/2014: Has not taken  . ondansetron (ZOFRAN) 8 MG tablet Take 1 tablet (8 mg total) by mouth every 8 (eight) hours as needed for nausea or vomiting.   . pantoprazole (PROTONIX) 40 MG tablet Take 1 tablet (40 mg total) by mouth daily.   . prochlorperazine (COMPAZINE) 10 MG tablet Take 1 tablet (10 mg total) by mouth every 6 (six) hours as needed for nausea or vomiting.   . Vitamin D, Ergocalciferol, (DRISDOL) 50000 UNITS CAPS Take 50,000 Units by mouth 2 (two) times a week. Tuesdays and Thurdays    No facility-administered encounter medications on file as of 04/14/2016.      ALLERGIES:  Allergies  Allergen Reactions  . Ciprofloxacin Other (See Comments)    Hallucination  . Lipitor [Atorvastatin] Other (See Comments)    Muscle Weakness  . Penicillins Rash    Has patient had a PCN reaction causing immediate rash, facial/tongue/throat swelling, SOB or lightheadedness with hypotension: unknown Has patient had a PCN reaction causing severe rash involving mucus membranes or skin necrosis: unknown Has patient had a PCN reaction that required hospitalization: unknown Has patient had a PCN reaction occurring within the last 10 years: unknown If all of the above answers are "NO", then may proceed with Cephalosporin use. 2     PHYSICAL EXAM:  ECOG Performance status: 2 - Symptomatic; requires assistance   Vitals:   04/14/16 1028  BP: (!) 163/54  Pulse: (!) 51  Resp: 16  Temp: 98.5 F (36.9 C)   Filed Weights   04/14/16 1028    Weight: 161 lb 4.8 oz (73.2 kg)    Physical Exam  Constitutional: She is oriented to person, place, and time and well-developed, well-nourished, and in no distress.  Exam done in wheelchair.   HENT:  Head: Normocephalic and atraumatic.  Mouth/Throat: Oropharynx is clear and moist. No oropharyngeal exudate.  She is mildly hard of hearing   Eyes: Conjunctivae are normal. Pupils are equal, round, and reactive to light. No scleral icterus.  Neck: Normal range of motion. Neck supple.  Cardiovascular: Normal rate, regular rhythm and normal heart  sounds.   Pulmonary/Chest: Effort normal and breath sounds normal. No respiratory distress. She has no wheezes.  Abdominal: Soft. Bowel sounds are normal. There is no tenderness. There is no rebound.  Musculoskeletal: Normal range of motion. She exhibits no edema.  Lymphadenopathy:    She has no cervical adenopathy.  Neurological: She is alert and oriented to person, place, and time.  Skin: Skin is warm and dry. No rash noted.  Psychiatric: Mood, memory, affect and judgment normal.  Nursing note and vitals reviewed.   LABORATORY DATA:  I have reviewed the labs as listed.  CBC    Component Value Date/Time   WBC 3.5 (L) 04/14/2016 0934   RBC 3.15 (L) 04/14/2016 0934   HGB 10.1 (L) 04/14/2016 0934   HCT 30.0 (L) 04/14/2016 0934   PLT 171 04/14/2016 0934   MCV 95.2 04/14/2016 0934   MCH 32.1 04/14/2016 0934   MCHC 33.7 04/14/2016 0934   RDW 13.7 04/14/2016 0934   LYMPHSABS 0.8 04/14/2016 0934   MONOABS 0.4 04/14/2016 0934   EOSABS 0.2 04/14/2016 0934   BASOSABS 0.0 04/14/2016 0934   CMP Latest Ref Rng & Units 04/14/2016 04/05/2016 03/24/2016  Glucose 65 - 99 mg/dL 148(H) 212(H) 163(H)  BUN 6 - 20 mg/dL 17 21(H) 19  Creatinine 0.44 - 1.00 mg/dL 1.73(H) 1.88(H) 1.81(H)  Sodium 135 - 145 mmol/L 135 138 136  Potassium 3.5 - 5.1 mmol/L 3.4(L) 3.7 3.6  Chloride 101 - 111 mmol/L 105 106 103  CO2 22 - 32 mmol/L 26 25 26   Calcium 8.9 - 10.3  mg/dL 8.2(L) 8.5(L) 9.0  Total Protein 6.5 - 8.1 g/dL 5.2(L) 5.5(L) 5.7(L)  Total Bilirubin 0.3 - 1.2 mg/dL 0.3 0.4 0.6  Alkaline Phos 38 - 126 U/L 98 100 72  AST 15 - 41 U/L 16 20 21   ALT 14 - 54 U/L 9(L) 10(L) 10(L)    PENDING LABS:    DIAGNOSTIC IMAGING:  MRI abdomen: 11/19/15     PATHOLOGY:  Liver biopsy: 12/07/15       ASSESSMENT & PLAN:   Stage IV Urothelial carcinoma, multifocal R ureteral masses with liver metastases: -s/p 3 cycles of Tecentriq thus far. Last dose of therapy was 03/24/16.  -She appears more frail today s/p several recent falls. Discussed with Dr. Talbert Cage; we will hold today's dose of Tecentriq and give IVF today and twice weekly x 2 weeks.  -We discussed that since immunotherapy, like Tecentiq, carries possible endocrinopathy adverse effects; given her fatigue, headaches, and falls, we will collect additional labs today (ACTH, FSH, LH, GH, prolactin) to rule out pituitary dysfunction secondary to therapy.  Her TSH is normal at 0.397.   -Return to cancer center in 3 weeks to re-consider Tecentriq.   Dizziness/Falls/Headaches:  -Likely secondary to dehydration. Tecentriq held today and IVF given in lieu of immunotherapy.  -There is small risk of hypophysitis with immunotherapy like Tecentriq.  -Will schedule IVF 2x/week for the next 2 weeks; supportive therapy IVF plan created.  -Discussed with Dr. Talbert Cage. We will obtain CT head without contrast as soon as able (given elevated creatinine, no contrast, and patient likely unable to tolerate MRI).   Decreased oral intake:  -Albumin quite low at 2.5 today. Recommended she supplement her diet with Ensure/Boost 2-3 cans/day, along with small frequent meals throughout the day. Stressed the importance of increased caloric and protein intake      Dispo:  -Hold this cycle of Tecentriq.  -Will arrange IVF 2x/week x 2 weeks.  -Return  to cancer center in 3 weeks for re-consideration of cycle #4 Tecentriq.    All  questions were answered to patient's stated satisfaction. Encouraged patient to call with any new concerns or questions before her next visit to the cancer center and we can certain see her sooner, if needed.     Plan of care discussed with Dr. Talbert Cage, who agrees with the above aforementioned.    Orders placed this encounter:  Orders Placed This Encounter  Procedures  . CT Head Wo Contrast     Mike Craze, NP [place] [phone]

## 2016-04-15 ENCOUNTER — Other Ambulatory Visit (HOSPITAL_COMMUNITY): Payer: Self-pay | Admitting: *Deleted

## 2016-04-15 ENCOUNTER — Encounter (HOSPITAL_COMMUNITY): Payer: Medicare Other

## 2016-04-15 ENCOUNTER — Ambulatory Visit (HOSPITAL_COMMUNITY): Payer: Medicare Other

## 2016-04-15 DIAGNOSIS — C689 Malignant neoplasm of urinary organ, unspecified: Secondary | ICD-10-CM

## 2016-04-15 LAB — FOLLICLE STIMULATING HORMONE: FSH: 64.3 m[IU]/mL

## 2016-04-15 LAB — ACTH: C206 ACTH: 8.6 pg/mL (ref 7.2–63.3)

## 2016-04-15 LAB — PROLACTIN: Prolactin: 10.9 ng/mL (ref 4.8–23.3)

## 2016-04-15 LAB — GROWTH HORMONE: Growth Hormone: 0.5 ng/mL (ref 0.0–10.0)

## 2016-04-15 LAB — LUTEINIZING HORMONE: LH: 23.9 m[IU]/mL

## 2016-04-18 ENCOUNTER — Ambulatory Visit (HOSPITAL_COMMUNITY)
Admission: RE | Admit: 2016-04-18 | Discharge: 2016-04-18 | Disposition: A | Discharge status: Home / Self Care | Payer: Medicare Other | Source: Ambulatory Visit | Attending: Adult Health | Admitting: Adult Health

## 2016-04-18 ENCOUNTER — Encounter (HOSPITAL_COMMUNITY)
Admission: RE | Admit: 2016-04-18 | Discharge: 2016-04-18 | Disposition: A | Discharge status: Home / Self Care | Payer: Medicare Other | Source: Ambulatory Visit | Attending: Oncology | Admitting: Oncology

## 2016-04-18 DIAGNOSIS — M1991 Primary osteoarthritis, unspecified site: Secondary | ICD-10-CM | POA: Insufficient documentation

## 2016-04-18 DIAGNOSIS — R51 Headache: Secondary | ICD-10-CM | POA: Insufficient documentation

## 2016-04-18 DIAGNOSIS — R5383 Other fatigue: Secondary | ICD-10-CM | POA: Diagnosis present

## 2016-04-18 DIAGNOSIS — Z8673 Personal history of transient ischemic attack (TIA), and cerebral infarction without residual deficits: Secondary | ICD-10-CM | POA: Diagnosis not present

## 2016-04-18 DIAGNOSIS — C689 Malignant neoplasm of urinary organ, unspecified: Secondary | ICD-10-CM | POA: Diagnosis not present

## 2016-04-18 DIAGNOSIS — I6529 Occlusion and stenosis of unspecified carotid artery: Secondary | ICD-10-CM | POA: Insufficient documentation

## 2016-04-18 DIAGNOSIS — M8580 Other specified disorders of bone density and structure, unspecified site: Secondary | ICD-10-CM | POA: Insufficient documentation

## 2016-04-18 DIAGNOSIS — R42 Dizziness and giddiness: Secondary | ICD-10-CM

## 2016-04-18 DIAGNOSIS — R519 Headache, unspecified: Secondary | ICD-10-CM

## 2016-04-18 MED ORDER — SODIUM CHLORIDE 0.9 % IV SOLN
INTRAVENOUS | Status: DC
  Administered 2016-04-18: 1000 mL via INTRAVENOUS

## 2016-04-21 ENCOUNTER — Encounter (HOSPITAL_COMMUNITY)
Admission: RE | Admit: 2016-04-21 | Discharge: 2016-04-21 | Disposition: A | Discharge status: Home / Self Care | Payer: Medicare Other | Source: Ambulatory Visit | Attending: Oncology | Admitting: Oncology

## 2016-04-21 ENCOUNTER — Encounter (HOSPITAL_COMMUNITY): Payer: Self-pay

## 2016-04-21 DIAGNOSIS — C689 Malignant neoplasm of urinary organ, unspecified: Secondary | ICD-10-CM | POA: Diagnosis not present

## 2016-04-21 MED ORDER — SODIUM CHLORIDE 0.9 % IV SOLN
INTRAVENOUS | Status: DC
  Administered 2016-04-21: 1000 mL via INTRAVENOUS

## 2016-04-22 ENCOUNTER — Other Ambulatory Visit (HOSPITAL_COMMUNITY): Payer: Self-pay | Admitting: Adult Health

## 2016-04-22 DIAGNOSIS — C689 Malignant neoplasm of urinary organ, unspecified: Secondary | ICD-10-CM

## 2016-04-22 DIAGNOSIS — E860 Dehydration: Secondary | ICD-10-CM

## 2016-04-25 ENCOUNTER — Encounter (HOSPITAL_COMMUNITY)
Admission: RE | Admit: 2016-04-25 | Discharge: 2016-04-25 | Disposition: A | Discharge status: Home / Self Care | Payer: Medicare Other | Source: Ambulatory Visit | Attending: Oncology | Admitting: Oncology

## 2016-04-25 DIAGNOSIS — C689 Malignant neoplasm of urinary organ, unspecified: Secondary | ICD-10-CM | POA: Diagnosis not present

## 2016-04-25 DIAGNOSIS — E860 Dehydration: Secondary | ICD-10-CM

## 2016-04-25 MED ORDER — SODIUM CHLORIDE 0.9 % IV SOLN
INTRAVENOUS | Status: DC
  Administered 2016-04-25: 1000 mL via INTRAVENOUS

## 2016-04-27 ENCOUNTER — Inpatient Hospital Stay (HOSPITAL_COMMUNITY)
Admission: EM | Admit: 2016-04-27 | Discharge: 2016-05-02 | DRG: 470 | Disposition: A | Discharge status: Skilled Nursing Facility | Payer: Medicare Other | Attending: Internal Medicine | Admitting: Internal Medicine

## 2016-04-27 ENCOUNTER — Other Ambulatory Visit (HOSPITAL_COMMUNITY): Payer: Self-pay | Admitting: Adult Health

## 2016-04-27 ENCOUNTER — Other Ambulatory Visit: Payer: Self-pay

## 2016-04-27 ENCOUNTER — Emergency Department (HOSPITAL_COMMUNITY): Payer: Medicare Other

## 2016-04-27 DIAGNOSIS — S72041A Displaced fracture of base of neck of right femur, initial encounter for closed fracture: Principal | ICD-10-CM | POA: Diagnosis present

## 2016-04-27 DIAGNOSIS — Z471 Aftercare following joint replacement surgery: Secondary | ICD-10-CM

## 2016-04-27 DIAGNOSIS — M25551 Pain in right hip: Secondary | ICD-10-CM | POA: Diagnosis not present

## 2016-04-27 DIAGNOSIS — C649 Malignant neoplasm of unspecified kidney, except renal pelvis: Secondary | ICD-10-CM | POA: Diagnosis present

## 2016-04-27 DIAGNOSIS — E1122 Type 2 diabetes mellitus with diabetic chronic kidney disease: Secondary | ICD-10-CM | POA: Diagnosis present

## 2016-04-27 DIAGNOSIS — W19XXXA Unspecified fall, initial encounter: Secondary | ICD-10-CM

## 2016-04-27 DIAGNOSIS — Z79899 Other long term (current) drug therapy: Secondary | ICD-10-CM

## 2016-04-27 DIAGNOSIS — E039 Hypothyroidism, unspecified: Secondary | ICD-10-CM | POA: Diagnosis present

## 2016-04-27 DIAGNOSIS — E785 Hyperlipidemia, unspecified: Secondary | ICD-10-CM | POA: Diagnosis present

## 2016-04-27 DIAGNOSIS — I129 Hypertensive chronic kidney disease with stage 1 through stage 4 chronic kidney disease, or unspecified chronic kidney disease: Secondary | ICD-10-CM | POA: Diagnosis present

## 2016-04-27 DIAGNOSIS — Z96641 Presence of right artificial hip joint: Secondary | ICD-10-CM

## 2016-04-27 DIAGNOSIS — I2510 Atherosclerotic heart disease of native coronary artery without angina pectoris: Secondary | ICD-10-CM | POA: Diagnosis present

## 2016-04-27 DIAGNOSIS — E8770 Fluid overload, unspecified: Secondary | ICD-10-CM | POA: Diagnosis not present

## 2016-04-27 DIAGNOSIS — R296 Repeated falls: Secondary | ICD-10-CM | POA: Diagnosis present

## 2016-04-27 DIAGNOSIS — N184 Chronic kidney disease, stage 4 (severe): Secondary | ICD-10-CM | POA: Diagnosis present

## 2016-04-27 DIAGNOSIS — W01190A Fall on same level from slipping, tripping and stumbling with subsequent striking against furniture, initial encounter: Secondary | ICD-10-CM | POA: Diagnosis present

## 2016-04-27 DIAGNOSIS — I252 Old myocardial infarction: Secondary | ICD-10-CM

## 2016-04-27 DIAGNOSIS — I255 Ischemic cardiomyopathy: Secondary | ICD-10-CM | POA: Diagnosis present

## 2016-04-27 DIAGNOSIS — S72009A Fracture of unspecified part of neck of unspecified femur, initial encounter for closed fracture: Secondary | ICD-10-CM | POA: Diagnosis present

## 2016-04-27 DIAGNOSIS — S72001A Fracture of unspecified part of neck of right femur, initial encounter for closed fracture: Secondary | ICD-10-CM

## 2016-04-27 DIAGNOSIS — E860 Dehydration: Secondary | ICD-10-CM

## 2016-04-27 DIAGNOSIS — Z7982 Long term (current) use of aspirin: Secondary | ICD-10-CM

## 2016-04-27 DIAGNOSIS — Z955 Presence of coronary angioplasty implant and graft: Secondary | ICD-10-CM

## 2016-04-27 DIAGNOSIS — C680 Malignant neoplasm of urethra: Secondary | ICD-10-CM | POA: Diagnosis present

## 2016-04-27 DIAGNOSIS — C801 Malignant (primary) neoplasm, unspecified: Secondary | ICD-10-CM | POA: Diagnosis present

## 2016-04-27 DIAGNOSIS — R410 Disorientation, unspecified: Secondary | ICD-10-CM | POA: Diagnosis not present

## 2016-04-27 DIAGNOSIS — C787 Secondary malignant neoplasm of liver and intrahepatic bile duct: Secondary | ICD-10-CM | POA: Diagnosis present

## 2016-04-27 DIAGNOSIS — E1121 Type 2 diabetes mellitus with diabetic nephropathy: Secondary | ICD-10-CM | POA: Diagnosis present

## 2016-04-27 DIAGNOSIS — C689 Malignant neoplasm of urinary organ, unspecified: Secondary | ICD-10-CM

## 2016-04-27 DIAGNOSIS — N390 Urinary tract infection, site not specified: Secondary | ICD-10-CM | POA: Diagnosis present

## 2016-04-27 LAB — BASIC METABOLIC PANEL
Anion gap: 7 (ref 5–15)
BUN: 14 mg/dL (ref 6–20)
CHLORIDE: 102 mmol/L (ref 101–111)
CO2: 24 mmol/L (ref 22–32)
CREATININE: 1.79 mg/dL — AB (ref 0.44–1.00)
Calcium: 8.5 mg/dL — ABNORMAL LOW (ref 8.9–10.3)
GFR calc Af Amer: 29 mL/min — ABNORMAL LOW (ref >60)
GFR calc non Af Amer: 25 mL/min — ABNORMAL LOW (ref >60)
GLUCOSE: 234 mg/dL — AB (ref 65–99)
POTASSIUM: 3.7 mmol/L (ref 3.5–5.1)
SODIUM: 133 mmol/L — AB (ref 135–145)

## 2016-04-27 LAB — CBC WITH DIFFERENTIAL/PLATELET
Basophils Absolute: 0 10*3/uL (ref 0.0–0.1)
Basophils Relative: 0 %
EOS ABS: 0.2 10*3/uL (ref 0.0–0.7)
EOS PCT: 2 %
HCT: 30.2 % — ABNORMAL LOW (ref 36.0–46.0)
HEMOGLOBIN: 10.2 g/dL — AB (ref 12.0–15.0)
LYMPHS PCT: 10 %
Lymphs Abs: 0.7 10*3/uL (ref 0.7–4.0)
MCH: 31.8 pg (ref 26.0–34.0)
MCHC: 33.8 g/dL (ref 30.0–36.0)
MCV: 94.1 fL (ref 78.0–100.0)
MONOS PCT: 8 %
Monocytes Absolute: 0.5 10*3/uL (ref 0.1–1.0)
Neutro Abs: 5.6 10*3/uL (ref 1.7–7.7)
Neutrophils Relative %: 80 %
Platelets: 167 10*3/uL (ref 150–400)
RBC: 3.21 MIL/uL — AB (ref 3.87–5.11)
RDW: 13.4 % (ref 11.5–15.5)
WBC: 7 10*3/uL (ref 4.0–10.5)

## 2016-04-27 MED ORDER — HYDROMORPHONE HCL 1 MG/ML IJ SOLN
0.5000 mg | Freq: Once | INTRAMUSCULAR | Status: AC
  Administered 2016-04-27: 0.5 mg via INTRAVENOUS
  Filled 2016-04-27: qty 1

## 2016-04-27 MED ORDER — ONDANSETRON HCL 4 MG/2ML IJ SOLN
4.0000 mg | Freq: Once | INTRAMUSCULAR | Status: AC
  Administered 2016-04-27: 4 mg via INTRAVENOUS
  Filled 2016-04-27: qty 2

## 2016-04-27 NOTE — ED Notes (Signed)
MD Zammit notified about pt, verbal orders given.

## 2016-04-27 NOTE — ED Notes (Signed)
Report given to Brandy RN

## 2016-04-27 NOTE — ED Notes (Signed)
Pt complaining of pain and requesting to see doctor. RN Judson Roch made aware and Dr. Roderic Palau notified

## 2016-04-27 NOTE — ED Provider Notes (Signed)
[place] DEPT Provider Note   CSN: [id number] Arrival date & time: 04/27/16  2113 By signing my name below, I, Dyke Brackett, attest that this documentation has been prepared under the direction and in the presence of Milton Ferguson, MD . Electronically Signed: Dyke Brackett, Scribe. 04/27/2016. 10:46 PM.   History   Chief Complaint Chief Complaint  Patient presents with  . Fall    right hip pain    HPI Joann Hunter is a 83 y.o. female with a history of kidney cancer, CAD, HTN, MI, and DM who presents to the Emergency Department complaining of sudden onset, constant, severe right hip pain s/p witnessed mechanical fall tonight PTA. Family member states she stumbled and fell, striking her head on the table. No LOC. No alleviating or modifying factors noted.  No OTC treatments tried for these symptoms PTA. Pt takes aspirin daily but denies the use of any anticoagulants.  Pt denies any headache or chest pain and has no other acute complaints at this time.  PCP: Monico Blitz, MD   Pt fell and hurt right hip   The history is provided by the patient. No language interpreter was used.  Fall  This is a new problem. The current episode started 1 to 2 hours ago. The problem occurs rarely. The problem has been resolved. Pertinent negatives include no chest pain, no abdominal pain and no headaches. Exacerbated by: movement.    Past Medical History:  Diagnosis Date  . Arthritis   . Back pain, chronic   . CAD (coronary artery disease)    NSTEMI 04/2011 with newly diagnosed 3V CAD s/p PCI/DES mLAD 04/11/11 with residual dz (per Dr. Burt Knack, should have consideration of PCI of the occluded RCA based on symptoms and/or consideration of outpatient nuclear perfusion testing after the patient recovers from her infarct)  . Cancer ([place])    kidney ( Nov.2017)  . Chronic kidney disease (CKD), stage IV (severe) ([place]) 12/13/2013  . Hyperglycemia   . Hyperlipidemia   . Hypertension   .  Hypothyroidism   . Ischemic cardiomyopathy    EF 45% by cath 04/20/11, 50-55% by echo 04/22/11. hypotension limiting medication titration   . Myocardial infarction ([place]) 2013  . Type 2 diabetes with nephropathy ([place]) 07/26/2014  . UTI (urinary tract infection) 12/13/2013    Patient Active Problem List   Diagnosis Date Noted  . Dehydration 04/14/2016  . Orthostasis 02/24/2016  . Acute encephalopathy 02/24/2016  . Confusion 02/21/2016  . Neutropenia ([place]) 02/20/2016  . Thrombocytopenia ([place]) 02/20/2016  . ESBL (extended spectrum beta-lactamase) producing bacteria infection 02/20/2016  . Goals of care, counseling/discussion 02/02/2016  . Urothelial cancer ([place]) 01/01/2016  . Rectal bleeding 11/05/2014  . Absolute anemia 11/05/2014  . Hypothyroidism 11/05/2014  . Metabolic encephalopathy 18/29/9371  . Type 2 diabetes with nephropathy ([place]) 07/26/2014  . Essential hypertension 07/26/2014  . Hyponatremia 07/25/2014  . Vertigo 07/25/2014  . AKI (acute kidney injury) ([place]) 12/13/2013  . Fall 12/13/2013  . Lower urinary tract infectious disease 12/13/2013  . Chronic kidney disease (CKD), stage IV (severe) ([place]) 12/13/2013  . Chest pain at rest 04/26/2011  . Syncope, near 04/26/2011  . CAD (coronary artery disease) 04/23/2011  . Cardiomyopathy, ischemic 04/23/2011  . Dyslipidemia 04/23/2011  . Myocardial infarction, anterior wall, initial care ([place]) 04/20/2011    Past Surgical History:  Procedure Laterality Date  . BACK SURGERY    . BREAST LUMPECTOMY     right  . CORONARY STENT PLACEMENT    . LEFT  HEART CATHETERIZATION WITH CORONARY ANGIOGRAM N/A 04/20/2011   Procedure: LEFT HEART CATHETERIZATION WITH CORONARY ANGIOGRAM;  Surgeon: Burnell Blanks, MD;  Location: [facility] C[facility] LAB;  Service: Cardiovascular;  Laterality: N/A;  . PERCUTANEOUS CORONARY STENT INTERVENTION (PCI-S) N/A 04/21/2011   Procedure: PERCUTANEOUS CORONARY STENT INTERVENTION (PCI-S);  Surgeon: Sherren Mocha, MD;   Location: [facility] CATH LAB;  Service: Cardiovascular;  Laterality: N/A;    OB History    Gravida Para Term Preterm AB Living   2 2 2     2    SAB TAB Ectopic Multiple Live Births                 Home Medications    Prior to Admission medications   Medication Sig Start Date End Date Taking? Authorizing Provider  aspirin 81 MG chewable tablet Chew 81 mg by mouth daily.     Historical Provider, MD  Atezolizumab (TECENTRIQ IV) Inject into the vein every 21 ( twenty-one) days.    Historical Provider, MD  docusate sodium (COLACE) 100 MG capsule Take 1 capsule (100 mg total) by mouth 2 (two) times daily. Patient taking differently: Take 100 mg by mouth daily.  11/06/14   Kathie Dike, MD  fish oil-omega-3 fatty acids 1000 MG capsule Take 1 g by mouth daily.    Historical Provider, MD  ibuprofen (ADVIL,MOTRIN) 200 MG tablet Take 200 mg by mouth every 6 (six) hours as needed for moderate pain.    Historical Provider, MD  levothyroxine (SYNTHROID, LEVOTHROID) 112 MCG tablet Take 1 tablet (112 mcg total) by mouth every morning. 12/16/13   Verlee Monte, MD  meclizine (ANTIVERT) 25 MG tablet Take 25 mg by mouth 4 (four) times daily as needed. Dizziness    Historical Provider, MD  nitroGLYCERIN (NITROSTAT) 0.4 MG SL tablet Place 0.4 mg under the tongue every 5 (five) minutes x 3 doses as needed. For chest pain. 04/23/11   Dayna N Dunn, PA-C  ondansetron (ZOFRAN) 8 MG tablet Take 1 tablet (8 mg total) by mouth every 8 (eight) hours as needed for nausea or vomiting. 01/29/16   Patrici Ranks, MD  pantoprazole (PROTONIX) 40 MG tablet Take 1 tablet (40 mg total) by mouth daily. 11/06/14   Kathie Dike, MD  prochlorperazine (COMPAZINE) 10 MG tablet Take 1 tablet (10 mg total) by mouth every 6 (six) hours as needed for nausea or vomiting. 01/29/16   Patrici Ranks, MD  Vitamin D, Ergocalciferol, (DRISDOL) 50000 UNITS CAPS Take 50,000 Units by mouth 2 (two) times a week. Tuesdays and Thurdays 05/30/12    Historical Provider, MD    Family History Family History  Problem Relation Age of Onset  . Other Mother   . Cancer Father   . Other      no known family cardiac disease  . Other Brother     Diptheria  . Depression Sister   . Pneumonia Sister   . Other Brother     murdered    Social History Social History  Substance Use Topics  . Smoking status: Never Smoker  . Smokeless tobacco: Never Used  . Alcohol use No    Allergies   Ciprofloxacin; Lipitor [atorvastatin]; and Penicillins   Review of Systems Review of Systems  Constitutional: Negative for appetite change and fatigue.  HENT: Negative for congestion, ear discharge and sinus pressure.   Eyes: Negative for discharge.  Respiratory: Negative for cough.   Cardiovascular: Negative for chest pain.  Gastrointestinal: Negative for abdominal pain and diarrhea.  Genitourinary: Negative for frequency and hematuria.  Musculoskeletal: Negative for back pain.  Skin: Negative for rash.  Neurological: Negative for seizures and headaches.  Psychiatric/Behavioral: Negative for hallucinations.   Physical Exam Updated Vital Signs BP (!) 195/61 (BP Location: Left Arm)   Pulse 63   Temp 98.2 F (36.8 C) (Oral)   Resp 16   SpO2 96%   Physical Exam  Constitutional: She is oriented to person, place, and time. She appears well-developed.  HENT:  Head: Normocephalic.  Eyes: Conjunctivae and EOM are normal. No scleral icterus.  Neck: Neck supple. No thyromegaly present.  Cardiovascular: Normal rate and regular rhythm.  Exam reveals no gallop and no friction rub.   No murmur heard. Pulmonary/Chest: No stridor. She has no wheezes. She has no rales. She exhibits no tenderness.  Abdominal: She exhibits no distension. There is no tenderness. There is no rebound.  Musculoskeletal: Normal range of motion. She exhibits tenderness. She exhibits no edema.  Moderate right hip tenderness  Lymphadenopathy:    She has no cervical adenopathy.   Neurological: She is oriented to person, place, and time. She exhibits normal muscle tone. Coordination normal.  Skin: No rash noted. No erythema.  Psychiatric: She has a normal mood and affect. Her behavior is normal.   ED Treatments / Results  DIAGNOSTIC STUDIES:  Oxygen Saturation is 96% on RA, normal by my interpretation.    COORDINATION OF CARE:  10:40 PM Discussed treatment plan which includes pain medication with pt at bedside and pt agreed to plan.   Labs (all labs ordered are listed, but only abnormal results are displayed) Labs Reviewed  CBC WITH DIFFERENTIAL/PLATELET  BASIC METABOLIC PANEL    EKG  EKG Interpretation None       Radiology Dg Chest 1 View  Result Date: 04/27/2016 CLINICAL DATA:  83 year old female with fall and right hip pain. EXAM: CHEST 1 VIEW COMPARISON:  Chest radiograph dated 02/24/2016 FINDINGS: There is mild cardiomegaly. Mild central and perihilar interstitial streaky density may represent mild vascular congestion. An atypical or viral pneumonia is not entirely excluded. Clinical correlation is recommended. There is no focal consolidation, pleural effusion, or pneumothorax. Coronary vascular calcification as well as atherosclerotic calcification of the aortic arch. There is osteopenia with degenerative changes of the spine. No acute fracture. IMPRESSION: Mild cardiomegaly with possible mild vascular congestion. No focal consolidation. Electronically Signed   By: Anner Crete M.D.   On: 04/27/2016 22:38   Dg Hip Unilat  With Pelvis 2-3 Views Right  Result Date: 04/27/2016 CLINICAL DATA:  83 year old female with fall and right hip pain. EXAM: DG HIP (WITH OR WITHOUT PELVIS) 2-3V RIGHT COMPARISON:  Radiograph dated 11/05/2014 FINDINGS: There is a minimally displaced fracture of the base of the right femoral neck with foreshortened appearance of the femoral neck. The femoral head remains in anatomic alignment with the acetabulum. No other acute  fracture identified. The bones are osteopenic. There is advanced osteoarthritic changes of the left hip. Soft tissues appear unremarkable. IMPRESSION: Minimally displaced basicervical fracture of the right femoral neck. No dislocation. Advanced osteoarthritic changes of the left hip. Electronically Signed   By: Anner Crete M.D.   On: 04/27/2016 22:34    Procedures Procedures (including critical care time)  Medications Ordered in ED Medications  HYDROmorphone (DILAUDID) injection 0.5 mg (not administered)  ondansetron (ZOFRAN) injection 4 mg (not administered)     Initial Impression / Assessment and Plan / ED Course  I have reviewed the triage vital signs and  the nursing notes.  Pertinent labs & imaging results that were available during my care of the patient were reviewed by me and considered in my medical decision making (see chart for details).     Admit right hip fx  Final Clinical Impressions(s) / ED Diagnoses   Final diagnoses:  Fall    New Prescriptions New Prescriptions   No medications on file   The chart was scribed for me under my direct supervision.  I personally performed the history, physical, and medical decision making and all procedures in the evaluation of this patient.Milton Ferguson, MD 04/29/16 [phone]

## 2016-04-27 NOTE — ED Notes (Signed)
Per family who witnessed fall pt got "tripped" on her own feet. Fell onto right side and hit her head, no LOC. Family states pt is acting like her normal self.

## 2016-04-27 NOTE — ED Notes (Signed)
Pt to CT at this time.

## 2016-04-27 NOTE — ED Notes (Signed)
Pt reports falling hand hitting R upper head, denies LOC.

## 2016-04-27 NOTE — ED Triage Notes (Signed)
Pt reports to ems that she fell at home after tripping over a lip in the flooring.  Pt with pain to right hip, also hit her head but no loc.  Pt is alert and oriented.

## 2016-04-28 ENCOUNTER — Encounter (HOSPITAL_COMMUNITY): Payer: Self-pay | Admitting: *Deleted

## 2016-04-28 ENCOUNTER — Encounter (HOSPITAL_COMMUNITY): Admission: RE | Admit: 2016-04-28 | Payer: Medicare Other | Source: Ambulatory Visit

## 2016-04-28 DIAGNOSIS — E1121 Type 2 diabetes mellitus with diabetic nephropathy: Secondary | ICD-10-CM | POA: Diagnosis present

## 2016-04-28 DIAGNOSIS — C787 Secondary malignant neoplasm of liver and intrahepatic bile duct: Secondary | ICD-10-CM | POA: Diagnosis present

## 2016-04-28 DIAGNOSIS — S72009A Fracture of unspecified part of neck of unspecified femur, initial encounter for closed fracture: Secondary | ICD-10-CM | POA: Diagnosis not present

## 2016-04-28 DIAGNOSIS — I252 Old myocardial infarction: Secondary | ICD-10-CM | POA: Diagnosis not present

## 2016-04-28 DIAGNOSIS — E8770 Fluid overload, unspecified: Secondary | ICD-10-CM | POA: Diagnosis not present

## 2016-04-28 DIAGNOSIS — E785 Hyperlipidemia, unspecified: Secondary | ICD-10-CM | POA: Diagnosis present

## 2016-04-28 DIAGNOSIS — N184 Chronic kidney disease, stage 4 (severe): Secondary | ICD-10-CM | POA: Diagnosis present

## 2016-04-28 DIAGNOSIS — I2510 Atherosclerotic heart disease of native coronary artery without angina pectoris: Secondary | ICD-10-CM | POA: Diagnosis present

## 2016-04-28 DIAGNOSIS — R296 Repeated falls: Secondary | ICD-10-CM | POA: Diagnosis present

## 2016-04-28 DIAGNOSIS — Z955 Presence of coronary angioplasty implant and graft: Secondary | ICD-10-CM | POA: Diagnosis not present

## 2016-04-28 DIAGNOSIS — I129 Hypertensive chronic kidney disease with stage 1 through stage 4 chronic kidney disease, or unspecified chronic kidney disease: Secondary | ICD-10-CM | POA: Diagnosis present

## 2016-04-28 DIAGNOSIS — E039 Hypothyroidism, unspecified: Secondary | ICD-10-CM | POA: Diagnosis present

## 2016-04-28 DIAGNOSIS — S72041A Displaced fracture of base of neck of right femur, initial encounter for closed fracture: Secondary | ICD-10-CM | POA: Diagnosis present

## 2016-04-28 DIAGNOSIS — I255 Ischemic cardiomyopathy: Secondary | ICD-10-CM | POA: Diagnosis present

## 2016-04-28 DIAGNOSIS — E1122 Type 2 diabetes mellitus with diabetic chronic kidney disease: Secondary | ICD-10-CM | POA: Diagnosis present

## 2016-04-28 DIAGNOSIS — N390 Urinary tract infection, site not specified: Secondary | ICD-10-CM | POA: Diagnosis present

## 2016-04-28 DIAGNOSIS — C680 Malignant neoplasm of urethra: Secondary | ICD-10-CM | POA: Diagnosis present

## 2016-04-28 DIAGNOSIS — Z79899 Other long term (current) drug therapy: Secondary | ICD-10-CM | POA: Diagnosis not present

## 2016-04-28 DIAGNOSIS — R410 Disorientation, unspecified: Secondary | ICD-10-CM | POA: Diagnosis not present

## 2016-04-28 DIAGNOSIS — C801 Malignant (primary) neoplasm, unspecified: Secondary | ICD-10-CM | POA: Diagnosis present

## 2016-04-28 DIAGNOSIS — W19XXXA Unspecified fall, initial encounter: Secondary | ICD-10-CM

## 2016-04-28 DIAGNOSIS — Z7982 Long term (current) use of aspirin: Secondary | ICD-10-CM | POA: Diagnosis not present

## 2016-04-28 DIAGNOSIS — M25551 Pain in right hip: Secondary | ICD-10-CM | POA: Diagnosis present

## 2016-04-28 DIAGNOSIS — C649 Malignant neoplasm of unspecified kidney, except renal pelvis: Secondary | ICD-10-CM | POA: Diagnosis present

## 2016-04-28 DIAGNOSIS — W01190A Fall on same level from slipping, tripping and stumbling with subsequent striking against furniture, initial encounter: Secondary | ICD-10-CM | POA: Diagnosis present

## 2016-04-28 LAB — COMPREHENSIVE METABOLIC PANEL
ALT: 12 U/L — ABNORMAL LOW (ref 14–54)
AST: 19 U/L (ref 15–41)
Albumin: 2.6 g/dL — ABNORMAL LOW (ref 3.5–5.0)
Alkaline Phosphatase: 96 U/L (ref 38–126)
Anion gap: 5 (ref 5–15)
BUN: 13 mg/dL (ref 6–20)
CO2: 27 mmol/L (ref 22–32)
Calcium: 8.2 mg/dL — ABNORMAL LOW (ref 8.9–10.3)
Chloride: 104 mmol/L (ref 101–111)
Creatinine, Ser: 1.65 mg/dL — ABNORMAL HIGH (ref 0.44–1.00)
GFR calc non Af Amer: 28 mL/min — ABNORMAL LOW (ref >60)
GFR, EST AFRICAN AMERICAN: 32 mL/min — AB (ref >60)
Glucose, Bld: 165 mg/dL — ABNORMAL HIGH (ref 65–99)
Potassium: 4.1 mmol/L (ref 3.5–5.1)
SODIUM: 136 mmol/L (ref 135–145)
Total Bilirubin: 0.5 mg/dL (ref 0.3–1.2)
Total Protein: 5.3 g/dL — ABNORMAL LOW (ref 6.5–8.1)

## 2016-04-28 LAB — ABO/RH: ABO/RH(D): O NEG

## 2016-04-28 LAB — CBC
HCT: 27.9 % — ABNORMAL LOW (ref 36.0–46.0)
HEMOGLOBIN: 9.1 g/dL — AB (ref 12.0–15.0)
MCH: 31.1 pg (ref 26.0–34.0)
MCHC: 32.6 g/dL (ref 30.0–36.0)
MCV: 95.2 fL (ref 78.0–100.0)
Platelets: 185 10*3/uL (ref 150–400)
RBC: 2.93 MIL/uL — AB (ref 3.87–5.11)
RDW: 13.5 % (ref 11.5–15.5)
WBC: 5.5 10*3/uL (ref 4.0–10.5)

## 2016-04-28 LAB — SURGICAL PCR SCREEN
MRSA, PCR: NEGATIVE
Staphylococcus aureus: NEGATIVE

## 2016-04-28 LAB — GLUCOSE, CAPILLARY
GLUCOSE-CAPILLARY: 181 mg/dL — AB (ref 65–99)
GLUCOSE-CAPILLARY: 173 mg/dL — AB (ref 65–99)
Glucose-Capillary: 153 mg/dL — ABNORMAL HIGH (ref 65–99)
Glucose-Capillary: 159 mg/dL — ABNORMAL HIGH (ref 65–99)
Glucose-Capillary: 139 mg/dL — ABNORMAL HIGH (ref 65–99)
Glucose-Capillary: 158 mg/dL — ABNORMAL HIGH (ref 65–99)

## 2016-04-28 LAB — PREPARE RBC (CROSSMATCH)

## 2016-04-28 LAB — TSH: TSH: 2.36 u[IU]/mL (ref 0.350–4.500)

## 2016-04-28 MED ORDER — ACETAMINOPHEN 650 MG RE SUPP: 650.0000 mg | Freq: Four times a day (QID) | RECTAL | Status: DC | PRN

## 2016-04-28 MED ORDER — OMEGA-3-ACID ETHYL ESTERS 1 G PO CAPS
1.0000 g | ORAL_CAPSULE | Freq: Every day | ORAL | Status: DC
  Administered 2016-04-30 – 2016-05-02 (×3): 1 g via ORAL
  Filled 2016-04-28 (×4): qty 1

## 2016-04-28 MED ORDER — ASPIRIN 81 MG PO CHEW
81.0000 mg | CHEWABLE_TABLET | Freq: Every day | ORAL | Status: DC
  Filled 2016-04-28: qty 1

## 2016-04-28 MED ORDER — PANTOPRAZOLE SODIUM 40 MG PO TBEC
40.0000 mg | DELAYED_RELEASE_TABLET | Freq: Every day | ORAL | Status: DC
  Administered 2016-04-30 – 2016-05-02 (×3): 40 mg via ORAL
  Filled 2016-04-28 (×4): qty 1

## 2016-04-28 MED ORDER — FUROSEMIDE 10 MG/ML IJ SOLN
40.0000 mg | Freq: Once | INTRAMUSCULAR | Status: AC
  Administered 2016-04-28: 40 mg via INTRAVENOUS
  Filled 2016-04-28: qty 4

## 2016-04-28 MED ORDER — LEVOTHYROXINE SODIUM 112 MCG PO TABS
112.0000 ug | ORAL_TABLET | Freq: Every morning | ORAL | Status: DC
  Administered 2016-04-30 – 2016-05-02 (×3): 112 ug via ORAL
  Filled 2016-04-28 (×4): qty 1

## 2016-04-28 MED ORDER — DOCUSATE SODIUM 100 MG PO CAPS
100.0000 mg | ORAL_CAPSULE | Freq: Every day | ORAL | Status: DC
  Filled 2016-04-28: qty 1

## 2016-04-28 MED ORDER — METOPROLOL TARTRATE 25 MG PO TABS
25.0000 mg | ORAL_TABLET | Freq: Two times a day (BID) | ORAL | Status: DC
  Administered 2016-04-28 – 2016-05-02 (×8): 25 mg via ORAL
  Filled 2016-04-28 (×8): qty 1

## 2016-04-28 MED ORDER — DEXTROSE-NACL 5-0.9 % IV SOLN
INTRAVENOUS | Status: DC
  Administered 2016-04-28 – 2016-04-29 (×2): via INTRAVENOUS

## 2016-04-28 MED ORDER — HYDROMORPHONE HCL 1 MG/ML IJ SOLN
0.5000 mg | INTRAMUSCULAR | Status: DC | PRN
  Administered 2016-04-28 – 2016-04-29 (×7): 0.5 mg via INTRAVENOUS
  Filled 2016-04-28 (×7): qty 0.5

## 2016-04-28 MED ORDER — ORAL CARE MOUTH RINSE
15.0000 mL | Freq: Two times a day (BID) | OROMUCOSAL | Status: DC
  Administered 2016-04-28 – 2016-05-02 (×5): 15 mL via OROMUCOSAL

## 2016-04-28 MED ORDER — HEPARIN SODIUM (PORCINE) 5000 UNIT/ML IJ SOLN: 5000.0000 [IU] | Freq: Three times a day (TID) | INTRAMUSCULAR | Status: DC

## 2016-04-28 MED ORDER — SODIUM CHLORIDE 0.9% FLUSH
3.0000 mL | Freq: Two times a day (BID) | INTRAVENOUS | Status: DC
  Administered 2016-04-28 – 2016-05-02 (×8): 3 mL via INTRAVENOUS

## 2016-04-28 MED ORDER — ACETAMINOPHEN 325 MG PO TABS
650.0000 mg | ORAL_TABLET | Freq: Four times a day (QID) | ORAL | Status: DC | PRN
  Administered 2016-04-28: 650 mg via ORAL
  Filled 2016-04-28: qty 2

## 2016-04-28 MED ORDER — HEPARIN SODIUM (PORCINE) 5000 UNIT/ML IJ SOLN
5000.0000 [IU] | Freq: Once | INTRAMUSCULAR | Status: AC
  Administered 2016-04-28: 5000 [IU] via SUBCUTANEOUS
  Filled 2016-04-28: qty 1

## 2016-04-28 MED ORDER — INSULIN ASPART 100 UNIT/ML ~~LOC~~ SOLN
0.0000 [IU] | SUBCUTANEOUS | Status: DC
  Administered 2016-04-28: 1 [IU] via SUBCUTANEOUS
  Administered 2016-04-28 – 2016-04-29 (×5): 2 [IU] via SUBCUTANEOUS
  Administered 2016-04-29: 3 [IU] via SUBCUTANEOUS
  Administered 2016-04-29 – 2016-04-30 (×3): 2 [IU] via SUBCUTANEOUS
  Administered 2016-04-30 (×2): 1 [IU] via SUBCUTANEOUS

## 2016-04-28 MED ORDER — SODIUM CHLORIDE 0.9 % IV SOLN
Freq: Once | INTRAVENOUS | Status: AC
  Administered 2016-04-28: 13:00:00 via INTRAVENOUS

## 2016-04-28 NOTE — Consult Note (Signed)
CONSULT   Chief complaint Right hip fracture  Requesting physician DR  D. Hildred Alamin  This is an 83 year old female who has a history of recent onset of frequent falls who requires a walker for ambulation in the household in a wheelchair when out of the house presents after tripping after celebrating her 62nd wedding anniversary. She fell and injured her right hip her family tried to get her up she had severe pain over the right hip which was nonradiating. They decided to get an ambulance to bring him to the hospital. Date of injury was 04/28/2016  The workup in the emergency room revealed that she has a right femoral neck fracture which is displaced.     Review of Systems  Constitutional: Negative for malaise/fatigue and weight loss.       History of cancer of the kidneys with metastatic disease to the liver  HENT: Negative for ear pain and sore throat.   Eyes: Negative for pain and discharge.  Respiratory: Negative for shortness of breath.   Cardiovascular: Negative for chest pain.  Gastrointestinal: Negative for abdominal pain.  Genitourinary: Negative for flank pain.  Musculoskeletal: Positive for back pain, falls, joint pain and myalgias.  Skin: Negative for itching and rash.  Neurological: Negative for sensory change, focal weakness, loss of consciousness, weakness and headaches.  Endo/Heme/Allergies: Negative for environmental allergies and polydipsia. Does not bruise/bleed easily.  Psychiatric/Behavioral: Negative for substance abuse and suicidal ideas. The patient does not have insomnia.   All other systems reviewed and are negative.   Past Medical History:  Diagnosis Date  . Arthritis   . Back pain, chronic   . CAD (coronary artery disease)    NSTEMI 04/2011 with newly diagnosed 3V CAD s/p PCI/DES mLAD 04/11/11 with residual dz (per Dr. Burt Knack, should have consideration of PCI of the occluded RCA based on symptoms and/or consideration of outpatient nuclear perfusion testing  after the patient recovers from her infarct)  . Cancer ([place])    kidney ( Nov.2017)  . Chronic kidney disease (CKD), stage IV (severe) ([place]) 12/13/2013  . Hyperglycemia   . Hyperlipidemia   . Hypertension   . Hypothyroidism   . Ischemic cardiomyopathy    EF 45% by cath 04/20/11, 50-55% by echo 04/22/11. hypotension limiting medication titration   . Myocardial infarction ([place]) 2013  . Type 2 diabetes with nephropathy ([place]) 07/26/2014  . UTI (urinary tract infection) 12/13/2013   Past Surgical History:  Procedure Laterality Date  . BACK SURGERY    . BREAST LUMPECTOMY     right  . CORONARY STENT PLACEMENT    . LEFT HEART CATHETERIZATION WITH CORONARY ANGIOGRAM N/A 04/20/2011   Procedure: LEFT HEART CATHETERIZATION WITH CORONARY ANGIOGRAM;  Surgeon: Burnell Blanks, MD;  Location[facility] CATH LAB;  Service: Cardiovascular;  Laterality: N/A;  . PERCUTANEOUS CORONARY STENT INTERVENTION (PCI-S) N/A 04/21/2011   Procedure: PERCUTANEOUS CORONARY STENT INTERVENTION (PCI-S);  Surgeon: Sherren Mocha, MD;  Location: [facility] CATH LAB;  Service: Cardiovascular;  Laterality: N/A;   Social History  Substance Use Topics  . Smoking status: Never Smoker  . Smokeless tobacco: Never Used  . Alcohol use No   Family History  Problem Relation Age of Onset  . Other Mother   . Cancer Father   . Other      no known family cardiac disease  . Other Brother     Diptheria  . Depression Sister   . Pneumonia Sister   . Other Brother     murdered  BP (!) 162/60 (BP Location: Right Arm)   Pulse 81   Temp 98.6 F (37 C) (Oral)   Resp 18   Ht 5\' 1"  (1.549 m)   Wt 163 lb 12.8 oz (74.3 kg)   SpO2 99%   BMI 30.95 kg/m   General appearance the patient appears to be of normal developmental status with normal grooming and hygiene  Her mood and affect were pleasant  She was oriented to person place and time  She was unable to ambulate secondary to right hip pain and fracture  Right upper extremity and  left upper extremity Inspection normal alignment Range of motion no contracture  Stability no subluxation Motor muscle tone normal grip strength normal Nerve function normal sensation Vascular normal pulses and perfusion Lymph nodes axilla and epitrochlear region normal  Left lower extremity Inspection revealed normal alignment, there is no joint contracture. Stability tests were normal in the major joints. Muscle tone and strength were normal nerve function was normal with normal sensation. Pulses were excellent. Lymph nodes were normal in the groin  Right lower extremity Inspection revealed external rotation deformity of the limb with tenderness over the proximal femur Range of motion was not tested because of pain The knee and ankle were reduced without subluxation Stability of the knee and ankle were normal to stress testing Muscle tone was normal throughout the right lower extremity Normal sensation Normal pulse and perfusion normal temperature Lymph nodes in the groin were normal  Coordination tested in the upper extremity were limited by overall comprehension of what was asked   CBC Latest Ref Rng & Units 04/27/2016 04/14/2016 04/05/2016  WBC 4.0 - 10.5 K/uL 7.0 3.5(L) 4.1  Hemoglobin 12.0 - 15.0 g/dL 10.2(L) 10.1(L) 11.0(L)  Hematocrit 36.0 - 46.0 % 30.2(L) 30.0(L) 32.7(L)  Platelets 150 - 400 K/uL 167 171 161    BMP Latest Ref Rng & Units 04/28/2016 04/27/2016 04/14/2016  Glucose 65 - 99 mg/dL 165(H) 234(H) 148(H)  BUN 6 - 20 mg/dL 13 14 17   Creatinine 0.44 - 1.00 mg/dL 1.65(H) 1.79(H) 1.73(H)  Sodium 135 - 145 mmol/L 136 133(L) 135  Potassium 3.5 - 5.1 mmol/L 4.1 3.7 3.4(L)  Chloride 101 - 111 mmol/L 104 102 105  CO2 22 - 32 mmol/L 27 24 26   Calcium 8.9 - 10.3 mg/dL 8.2(L) 8.5(L) 8.2(L)   Plain films show right femoral neck fracture displaced with arthritis in the left hip  Diagnosis right femoral neck fracture  Assessment: 83 year old female with renal cancer and  liver metastases who is in reasonable health at this time presents with history of frequent falls and use of a walker for household ambulation with a right hip fracture displaced  The patient would benefit from bipolar replacement with improved mobilization, pain relief, improve function for the balance of her life  She and her family were present for the evaluation and recommendations and they understand the risks and benefits of surgery versus nonsurgical treatment  They have opted to continue with the right hip partial/bipolar replacement

## 2016-04-28 NOTE — ED Notes (Signed)
Report given to Nicole RN on 300. 

## 2016-04-28 NOTE — Progress Notes (Signed)
PROGRESS NOTE                                                                                                                                                                                                             Patient Demographics:    Joann Hunter, is a 83 y.o. female, DOB - March 04, 1932, XBM:[id number]  Admit date - 04/27/2016   Admitting Physician Orvan Falconer, MD  Outpatient Primary MD for the patient is [facility], MD  LOS - 0  Outpatient Specialists: Oncology Dr Penland>> Dr Irene Limbo  Chief Complaint  Patient presents with  . Fall    right hip pain       Brief Narrative  This is no charge note on a patient admitted earlier to day by Dr. Truman Hayward, chart, imaging, labs were reviewed.  83 y.o. female with hx of ischemic CMP, s/p NSTEMI, hx of chronic back pain, uretheral cancer on chemotherapy every 3 weeks, hx of HTN, HLD, Hypothyrodism, type II DM with nephropathy, fell sustained right hip fracture.   Subjective:    Joann Hunter today has, No headache, No chest pain, No shortness of breath, no cough no fever, reports right hip pain better controlled with pain medicine .   Assessment  & Plan :    Principal Problem:   Hip fracture, unspecified laterality, closed, initial encounter [facility]) Active Problems:   Ischemic cardiomyopathy   Hyperlipidemia   Cancer ([place])   Hip fracture ([place])   Right hip fracture  - secondary to mechanical fall, Dr. Aline Brochure has been consulted, will evaluate today regarding timing of surgery. - continue with when necessary pain  Medication.  CKD stage IV - At baseline, continue to monitor closely.  Hypothyroidism - Continue Synthroid  Stage IV Urothelial carcinoma, multifocal R ureteral masses with liver metastases - She is on chemotherapy, continue to follow with oncology as an outpatient  Type 2 diabetes - He doesn't appear to be in any home medication, will monitor closely  CAD.  - She had NSTEMI  04/2011 with 3 vessel CAD s/p PCI.  - She denies any chest pain or shortness of breath, continue with aspirin - Chest x-ray with some mild volume overload, will decrease her IV fluids, will give one dose of Lasix in case she was for surgery today, and will return her fluids closely perioperatively.  Code Status :  Full  Family Communication  : Granddaughter and daughter at bedside  Disposition Plan  : Pending PT evaluation after surgery  Consults  :  Orthopedic  Procedures  : None  DVT Prophylaxis  :   Heparin - SCDs   Lab Results  Component Value Date   PLT 185 04/28/2016    Antibiotics  :   Anti-infectives    None        Objective:   Vitals:   04/28/16 0200 04/28/16 0247 04/28/16 0340 04/28/16 0622  BP: (!) 150/62 (!) 206/76 (!) 165/55 (!) 149/61  Pulse: 75 83 80 84  Resp: 14 16  18   Temp:  97.8 F (36.6 C)  98 F (36.7 C)  TempSrc:  Oral  Oral  SpO2: 95% 99% 98% 96%  Weight:  74.3 kg (163 lb 12.8 oz)    Height:  5\' 1"  (1.549 m)      Wt Readings from Last 3 Encounters:  04/28/16 74.3 kg (163 lb 12.8 oz)  04/14/16 73.2 kg (161 lb 4.8 oz)  03/24/16 71.2 kg (157 lb)     Intake/Output Summary (Last 24 hours) at 04/28/16 1106 Last data filed at 04/28/16 1028  Gross per 24 hour  Intake              538 ml  Output              500 ml  Net               38 ml     Physical Exam  Awake Alert, Oriented X 3, Supple Neck,No JVD, Symmetrical Chest wall movement, Good air movement bilaterally, CTAB RRR,No Gallops,Rubs or new Murmurs, No Parasternal Heave +ve B.Sounds, Abd Soft, No tenderness,  No rebound - guarding or rigidity. No Cyanosis, Clubbing or edema, No new Rash or bruise      Data Review:    CBC  Recent Labs Lab 04/27/16 2252 04/28/16 0600  WBC 7.0 5.5  HGB 10.2* 9.1*  HCT 30.2* 27.9*  PLT 167 185  MCV 94.1 95.2  MCH 31.8 31.1  MCHC 33.8 32.6  RDW 13.4 13.5  LYMPHSABS 0.7  --   MONOABS 0.5  --   EOSABS 0.2  --   BASOSABS 0.0  --      Chemistries   Recent Labs Lab 04/27/16 2252 04/28/16 0600  NA 133* 136  K 3.7 4.1  CL 102 104  CO2 24 27  GLUCOSE 234* 165*  BUN 14 13  CREATININE 1.79* 1.65*  CALCIUM 8.5* 8.2*  AST  --  19  ALT  --  12*  ALKPHOS  --  96  BILITOT  --  0.5   ------------------------------------------------------------------------------------------------------------------ No results for input(s): CHOL, HDL, LDLCALC, TRIG, CHOLHDL, LDLDIRECT in the last 72 hours.  Lab Results  Component Value Date   HGBA1C 6.3 (H) 02/24/2016   ------------------------------------------------------------------------------------------------------------------  Recent Labs  04/27/16 2252  TSH 2.360   ------------------------------------------------------------------------------------------------------------------ No results for input(s): VITAMINB12, FOLATE, FERRITIN, TIBC, IRON, RETICCTPCT in the last 72 hours.  Coagulation profile No results for input(s): INR, PROTIME in the last 168 hours.  No results for input(s): DDIMER in the last 72 hours.  Cardiac Enzymes No results for input(s): CKMB, TROPONINI, MYOGLOBIN in the last 168 hours.  Invalid input(s): CK ------------------------------------------------------------------------------------------------------------------    Component Value Date/Time   BNP 155.0 (H) 07/26/2014 0557    Inpatient Medications  Scheduled Meds: . aspirin  81 mg Oral Daily  . docusate sodium  100 mg Oral Daily  . insulin aspart  0-9 Units Subcutaneous Q4H  . levothyroxine  112 mcg Oral q morning - 10a  . mouth rinse  15 mL Mouth Rinse BID  . omega-3 acid ethyl esters  1 g Oral Daily  . pantoprazole  40 mg Oral Daily  . sodium chloride flush  3 mL Intravenous Q12H   Continuous Infusions: . sodium chloride    . dextrose 5 % and 0.9% NaCl 75 mL/hr at 04/28/16 0321   PRN Meds:.acetaminophen **OR** acetaminophen, HYDROmorphone (DILAUDID) injection  Micro  Results Recent Results (from the past 240 hour(s))  Surgical PCR screen     Status: None   Collection Time: 04/28/16  3:26 AM  Result Value Ref Range Status   MRSA, PCR NEGATIVE NEGATIVE Final   Staphylococcus aureus NEGATIVE NEGATIVE Final    Comment:        The Xpert SA Assay (FDA approved for NASAL specimens in patients over 21 years of age), is one component of a comprehensive surveillance program.  Test performance has been validated by [facility] for patients greater than or equal to 40 year old. It is not intended to diagnose infection nor to guide or monitor treatment.     Radiology Reports Dg Chest 1 View  Result Date: 04/27/2016 CLINICAL DATA:  84 year old female with fall and right hip pain. EXAM: CHEST 1 VIEW COMPARISON:  Chest radiograph dated 02/24/2016 FINDINGS: There is mild cardiomegaly. Mild central and perihilar interstitial streaky density may represent mild vascular congestion. An atypical or viral pneumonia is not entirely excluded. Clinical correlation is recommended. There is no focal consolidation, pleural effusion, or pneumothorax. Coronary vascular calcification as well as atherosclerotic calcification of the aortic arch. There is osteopenia with degenerative changes of the spine. No acute fracture. IMPRESSION: Mild cardiomegaly with possible mild vascular congestion. No focal consolidation. Electronically Signed   By: Anner Crete M.D.   On: 04/27/2016 22:38   Dg Elbow Complete Right  Result Date: 04/01/2016 CLINICAL DATA:  Patient fell today at home. States that she was not dizzy, but cannot remember what caused her to fall. Family states that she was taken off of her blood pressure meds because they were causing hypotension. States that she has fallen twice this week and was seen by her physician on Tuesday for the fall. She had hit her head and had multiple bruises on her hands and arms. EXAM: RIGHT ELBOW - COMPLETE 3+ VIEW COMPARISON:  None. FINDINGS:  No fracture. The elbow joint is normally spaced and aligned. No significant arthropathic change. No joint effusion. There is mild posterior subcutaneous soft tissue edema. IMPRESSION: No fracture or dislocation. Electronically Signed   By: Lajean Manes M.D.   On: 04/01/2016 20:41   Dg Forearm Right  Result Date: 04/01/2016 CLINICAL DATA:  Patient fell today at home. States that she was not dizzy, but cannot remember what caused her to fall. Family states that she was taken off of her blood pressure meds because they were causing hypotension. States that she has fallen twice this week and was seen by her physician on Tuesday for the fall. She had hit her head and had multiple bruises on her hands and arms. EXAM: RIGHT FOREARM - 2 VIEW COMPARISON:  None. FINDINGS: No fracture.  No bone lesion.  The bones are demineralized. The elbow and wrist joints are normally aligned. The soft tissues are unremarkable. IMPRESSION: No fracture, bone lesion or dislocation. Electronically Signed   By: Shanon Brow  Ormond M.D.   On: 04/01/2016 20:41   Ct Head Wo Contrast  Result Date: 04/27/2016 CLINICAL DATA:  83 year old female with fall. EXAM: CT HEAD WITHOUT CONTRAST CT CERVICAL SPINE WITHOUT CONTRAST TECHNIQUE: Multidetector CT imaging of the head and cervical spine was performed following the standard protocol without intravenous contrast. Multiplanar CT image reconstructions of the cervical spine were also generated. COMPARISON:  Head CT dated 02/24/2016 FINDINGS: CT HEAD FINDINGS Brain: There is moderate age-related atrophy and chronic microvascular ischemic changes is no acute intracranial hemorrhage. No mass effect or midline shift noted. No intra-axial fluid collection. Vascular: No hyperdense vessel or unexpected calcification. Skull: Normal. Negative for fracture or focal lesion. Sinuses/Orbits: No acute finding. Other: None CT CERVICAL SPINE FINDINGS Alignment: No acute subluxation. New there is straightening of the  normal cervical lordosis which may be positional or due to muscle spasm or secondary to degenerative changes. Skull base and vertebrae: No acute fracture. Incomplete bony fusion of the posterior ring of C1. The bones are osteopenic. Soft tissues and spinal canal: No prevertebral fluid or swelling. No visible canal hematoma. Disc levels: Multilevel degenerative changes with endplate irregularity and disc space narrowing most prominent involving C5-C6 and C6-C7. There is anterior osteophyte at C4-C5 and C5-C6. Multilevel facet hypertrophy with associated neural foramina narrowing most prominent at C3-C4 on the left. Upper chest: Bilateral carotid bulb atherosclerotic plaques. The visualized lung bases are clear. Other: None IMPRESSION: 1. No acute intracranial hemorrhage. Age-related atrophy and chronic microvascular ischemic changes. 2. No acute/traumatic cervical spine pathology. Multilevel degenerative changes. Electronically Signed   By: Anner Crete M.D.   On: 04/27/2016 22:47   Ct Head Wo Contrast  Result Date: 04/18/2016 CLINICAL DATA:  New headaches, dizziness and falls for 3 weeks. History of stage IV urothelial carcinoma. EXAM: CT HEAD WITHOUT CONTRAST TECHNIQUE: Contiguous axial images were obtained from the base of the skull through the vertex without intravenous contrast. COMPARISON:  CT HEAD February 21st 1,018 FINDINGS: BRAIN: No intraparenchymal hemorrhage, mass effect nor midline shift. The ventricles and sulci are normal for age. Patchy supratentorial white matter hypodensities within normal range for patient's age, though non-specific are most compatible with chronic small vessel ischemic disease. Old LEFT pontine lacunar infarct. Old LEFT cerebellar small infarcts. No acute large vascular territory infarcts. No abnormal extra-axial fluid collections. Basal cisterns are patent. VASCULAR: Moderate calcific atherosclerosis of the carotid siphons. SKULL: No skull fracture. Moderate RIGHT  temporomandibular osteoarthrosis. Osteopenia. No significant scalp soft tissue swelling. SINUSES/ORBITS: Trace paranasal sinus mucosal thickening. Mastoid air cells are well aerated. The included ocular globes and orbital contents are non-suspicious. OTHER: Patient is edentulous. IMPRESSION: No acute intracranial process. Stable examination including moderate chronic small vessel ischemic disease and old small LEFT cerebellar infarcts. Electronically Signed   By: Elon Alas M.D.   On: 04/18/2016 22:23   Ct Cervical Spine Wo Contrast  Result Date: 04/27/2016 CLINICAL DATA:  83 year old female with fall. EXAM: CT HEAD WITHOUT CONTRAST CT CERVICAL SPINE WITHOUT CONTRAST TECHNIQUE: Multidetector CT imaging of the head and cervical spine was performed following the standard protocol without intravenous contrast. Multiplanar CT image reconstructions of the cervical spine were also generated. COMPARISON:  Head CT dated 02/24/2016 FINDINGS: CT HEAD FINDINGS Brain: There is moderate age-related atrophy and chronic microvascular ischemic changes is no acute intracranial hemorrhage. No mass effect or midline shift noted. No intra-axial fluid collection. Vascular: No hyperdense vessel or unexpected calcification. Skull: Normal. Negative for fracture or focal lesion. Sinuses/Orbits: No acute finding. Other:  None CT CERVICAL SPINE FINDINGS Alignment: No acute subluxation. New there is straightening of the normal cervical lordosis which may be positional or due to muscle spasm or secondary to degenerative changes. Skull base and vertebrae: No acute fracture. Incomplete bony fusion of the posterior ring of C1. The bones are osteopenic. Soft tissues and spinal canal: No prevertebral fluid or swelling. No visible canal hematoma. Disc levels: Multilevel degenerative changes with endplate irregularity and disc space narrowing most prominent involving C5-C6 and C6-C7. There is anterior osteophyte at C4-C5 and C5-C6.  Multilevel facet hypertrophy with associated neural foramina narrowing most prominent at C3-C4 on the left. Upper chest: Bilateral carotid bulb atherosclerotic plaques. The visualized lung bases are clear. Other: None IMPRESSION: 1. No acute intracranial hemorrhage. Age-related atrophy and chronic microvascular ischemic changes. 2. No acute/traumatic cervical spine pathology. Multilevel degenerative changes. Electronically Signed   By: Anner Crete M.D.   On: 04/27/2016 22:47   Dg Hip Unilat  With Pelvis 2-3 Views Right  Result Date: 04/27/2016 CLINICAL DATA:  83 year old female with fall and right hip pain. EXAM: DG HIP (WITH OR WITHOUT PELVIS) 2-3V RIGHT COMPARISON:  Radiograph dated 11/05/2014 FINDINGS: There is a minimally displaced fracture of the base of the right femoral neck with foreshortened appearance of the femoral neck. The femoral head remains in anatomic alignment with the acetabulum. No other acute fracture identified. The bones are osteopenic. There is advanced osteoarthritic changes of the left hip. Soft tissues appear unremarkable. IMPRESSION: Minimally displaced basicervical fracture of the right femoral neck. No dislocation. Advanced osteoarthritic changes of the left hip. Electronically Signed   By: Anner Crete M.D.   On: 04/27/2016 22:34     Elya Diloreto M.D on 04/28/2016 at 11:06 AM  Between 7am to 7pm - Pager - [phone]  After 7pm go to [email or web address] - password [email or web address]  [facility] -  Office  [phone]

## 2016-04-28 NOTE — Care Management Note (Signed)
Case Management Note  Patient Details  Name: IOWA KAPPES MRN: [id number] Date of Birth: 1932/12/25  Subjective/Objective:  Chart reviewed. From home with wife and daughter. Adm with hip fracture. Surgery scheduled for tomorrow.                   Action/Plan: CM will follow for needs.   Expected Discharge Date:      04/30/2016            Expected Discharge Plan:     In-House Referral:     Discharge planning Services  CM Consult  Post Acute Care Choice:    Choice offered to:     DME Arranged:    DME Agency:     HH Arranged:    HH Agency:     Status of Service:  In process, will continue to follow  If discussed at Long Length of Stay Meetings, dates discussed:    Additional Comments:  Marlinda Miranda, Chauncey Reading, RN 04/28/2016, 12:07 PM

## 2016-04-28 NOTE — H&P (Signed)
History and Physical    Joann Hunter DDU:[id number] DOB: [birth year] DOA: 04/27/2016  PCP: Monico Blitz, MD  Patient coming from: Home.    Chief Complaint:  Mechanical fall with right hip Fx.   HPI: [name] is an 83 y.o. female with hx of ischemic CMP, s/p NSTEMI, hx of chronic back pain, uretheral cancer on chemotherapy every 3 weeks, hx of HTN, HLD, Hypothyrodism, type II DM with nephropathy, fell and hurt her right hip.  X ray showed right hip Fx.  CXR showed possible mild vascular congestion.  Serology showed Cr of 1.8, BS of 234, and Hb of 10 g per dL.  EKG showed NSR with no acute ST T changes. Head CT and cervical CT showed no acute pathology.  Dr Aline Brochure was consulted by EDP and hospitalist was asked to admit her for surgical repair.   She has no CP/SOB, but was hypoxic with Sat to 86 percent, corrected to 94% on 3 L [place].    ED Course:  See above.  Rewiew of Systems:  Constitutional: Negative for malaise, fever and chills. No significant weight loss or weight gain Eyes: Negative for eye pain, redness and discharge, diplopia, visual changes, or flashes of light. ENMT: Negative for ear pain, hoarseness, nasal congestion, sinus pressure and sore throat. No headaches; tinnitus, drooling, or problem swallowing. Cardiovascular: Negative for chest pain, palpitations, diaphoresis, dyspnea and peripheral edema. ; No orthopnea, PND Respiratory: Negative for cough, hemoptysis, wheezing and stridor. No pleuritic chestpain. Gastrointestinal: Negative for diarrhea, constipation,  melena, blood in stool, hematemesis, jaundice and rectal bleeding.    Genitourinary: Negative for frequency, dysuria, incontinence,flank pain and hematuria; Musculoskeletal: Negative for back pain and neck pain. Negative for swelling and trauma.;  Skin: . Negative for pruritus, rash, abrasions, bruising and skin lesion.; ulcerations Neuro: Negative for headache, lightheadedness and neck stiffness. Negative for  weakness, altered level of consciousness , altered mental status, extremity weakness, burning feet, involuntary movement, seizure and syncope.  Psych: negative for anxiety, depression, insomnia, tearfulness, panic attacks, hallucinations, paranoia, suicidal or homicidal ideation    Past Medical History:  Diagnosis Date  . Arthritis   . Back pain, chronic   . CAD (coronary artery disease)    NSTEMI 04/2011 with newly diagnosed 3V CAD s/p PCI/DES mLAD 04/11/11 with residual dz (per Dr. Burt Knack, should have consideration of PCI of the occluded RCA based on symptoms and/or consideration of outpatient nuclear perfusion testing after the patient recovers from her infarct)  . Cancer ([place])    kidney ( Nov.2017)  . Chronic kidney disease (CKD), stage IV (severe) ([place]) 12/13/2013  . Hyperglycemia   . Hyperlipidemia   . Hypertension   . Hypothyroidism   . Ischemic cardiomyopathy    EF 45% by cath 04/20/11, 50-55% by echo 04/22/11. hypotension limiting medication titration   . Myocardial infarction ([place]) 2013  . Type 2 diabetes with nephropathy ([place]) 07/26/2014  . UTI (urinary tract infection) 12/13/2013     Past Surgical History:  Procedure Laterality Date  . BACK SURGERY    . BREAST LUMPECTOMY     right  . CORONARY STENT PLACEMENT    . LEFT HEART CATHETERIZATION WITH CORONARY ANGIOGRAM N/A 04/20/2011   Procedure: LEFT HEART CATHETERIZATION WITH CORONARY ANGIOGRAM;  Surgeon: Burnell Blanks, MD;  Location[facility] CATH LAB;  Service: Cardiovascular;  Laterality: N/A;  . PERCUTANEOUS CORONARY STENT INTERVENTION (PCI-S) N/A 04/21/2011   Procedure: PERCUTANEOUS CORONARY STENT INTERVENTION (PCI-S);  Surgeon: Sherren Mocha, MD;  Location: [facility] CATH  LAB;  Service: Cardiovascular;  Laterality: N/A;     reports that she has never smoked. She has never used smokeless tobacco. She reports that she does not drink alcohol or use drugs.  Allergies  Allergen Reactions  . Ciprofloxacin Other (See Comments)     Hallucination  . Lipitor [Atorvastatin] Other (See Comments)    Muscle Weakness  . Penicillins Rash    Has patient had a PCN reaction causing immediate rash, facial/tongue/throat swelling, SOB or lightheadedness with hypotension: unknown Has patient had a PCN reaction causing severe rash involving mucus membranes or skin necrosis: unknown Has patient had a PCN reaction that required hospitalization: unknown Has patient had a PCN reaction occurring within the last 10 years: unknown If all of the above answers are "NO", then may proceed with Cephalosporin use. 2    Family History  Problem Relation Age of Onset  . Other Mother   . Cancer Father   . Other      no known family cardiac disease  . Other Brother     Diptheria  . Depression Sister   . Pneumonia Sister   . Other Brother     murdered     Prior to Admission medications   Medication Sig Start Date End Date Taking? Authorizing Provider  aspirin 81 MG chewable tablet Chew 81 mg by mouth daily.    Yes Historical Provider, MD  Atezolizumab (TECENTRIQ IV) Inject into the vein every 21 ( twenty-one) days.   Yes Historical Provider, MD  docusate sodium (COLACE) 100 MG capsule Take 1 capsule (100 mg total) by mouth 2 (two) times daily. Patient taking differently: Take 100 mg by mouth daily.  11/06/14  Yes Kathie Dike, MD  fish oil-omega-3 fatty acids 1000 MG capsule Take 1 g by mouth daily.   Yes Historical Provider, MD  ibuprofen (ADVIL,MOTRIN) 200 MG tablet Take 200 mg by mouth every 6 (six) hours as needed for moderate pain.   Yes Historical Provider, MD  levothyroxine (SYNTHROID, LEVOTHROID) 112 MCG tablet Take 1 tablet (112 mcg total) by mouth every morning. 12/16/13  Yes Verlee Monte, MD  meclizine (ANTIVERT) 25 MG tablet Take 25 mg by mouth 4 (four) times daily as needed. Dizziness   Yes Historical Provider, MD  nitroGLYCERIN (NITROSTAT) 0.4 MG SL tablet Place 0.4 mg under the tongue every 5 (five) minutes x 3 doses as  needed. For chest pain. 04/23/11  Yes Dayna N Dunn, PA-C  ondansetron (ZOFRAN) 8 MG tablet Take 1 tablet (8 mg total) by mouth every 8 (eight) hours as needed for nausea or vomiting. 01/29/16  Yes Patrici Ranks, MD  pantoprazole (PROTONIX) 40 MG tablet Take 1 tablet (40 mg total) by mouth daily. 11/06/14  Yes Kathie Dike, MD  prochlorperazine (COMPAZINE) 10 MG tablet Take 1 tablet (10 mg total) by mouth every 6 (six) hours as needed for nausea or vomiting. 01/29/16  Yes Patrici Ranks, MD  Vitamin D, Ergocalciferol, (DRISDOL) 50000 UNITS CAPS Take 50,000 Units by mouth 2 (two) times a week. Tuesdays and Thurdays 05/30/12  Yes Historical Provider, MD    Physical Exam: Vitals:   04/27/16 2114 04/27/16 2345 04/28/16 0000  BP: (!) 195/61 (!) 135/58 (!) 152/62  Pulse: 63 81 82  Resp: 16  14  Temp: 98.2 F (36.8 C)    TempSrc: Oral    SpO2: 96% (!) 86% 95%      Constitutional: NAD, calm, comfortable Vitals:   04/27/16 2114 04/27/16 2345 04/28/16 0000  BP: (!) 195/61 (!) 135/58 (!) 152/62  Pulse: 63 81 82  Resp: 16  14  Temp: 98.2 F (36.8 C)    TempSrc: Oral    SpO2: 96% (!) 86% 95%   Eyes: PERRL, lids and conjunctivae normal ENMT: Mucous membranes are moist. Posterior pharynx clear of any exudate or lesions.Normal dentition.  Neck: normal, supple, no masses, no thyromegaly Respiratory: clear to auscultation bilaterally, no wheezing, no crackles. Normal respiratory effort. No accessory muscle use.  Cardiovascular: Regular rate and rhythm, no murmurs / rubs / gallops. No extremity edema. 2+ pedal pulses. No carotid bruits.  Abdomen: no tenderness, no masses palpated. No hepatosplenomegaly. Bowel sounds positive.  Musculoskeletal: no clubbing / cyanosis. No joint deformity upper and lower extremities. Normal muscle tone.  Skin: no rashes, lesions, ulcers. No induration Neurologic: CN 2-12 grossly intact. Sensation intact, DTR normal. Strength 5/5 in all 4.  Psychiatric: Normal  judgment and insight. Alert and oriented x 3. Normal mood.    Labs on Admission: I have personally reviewed following labs and imaging studies  CBC:  Recent Labs Lab 04/27/16 2252  WBC 7.0  NEUTROABS 5.6  HGB 10.2*  HCT 30.2*  MCV 94.1  PLT 546   Basic Metabolic Panel:  Recent Labs Lab 04/27/16 2252  NA 133*  K 3.7  CL 102  CO2 24  GLUCOSE 234*  BUN 14  CREATININE 1.79*  CALCIUM 8.5*   Urine analysis:    Component Value Date/Time   COLORURINE YELLOW 02/24/2016 1000   APPEARANCEUR CLEAR 02/24/2016 1000   LABSPEC 1.015 02/24/2016 1000   PHURINE 6.0 02/24/2016 1000   GLUCOSEU NEGATIVE 02/24/2016 1000   HGBUR LARGE (A) 02/24/2016 1000   BILIRUBINUR NEGATIVE 02/24/2016 1000   KETONESUR NEGATIVE 02/24/2016 1000   PROTEINUR 100 (A) 02/24/2016 1000   UROBILINOGEN 0.2 11/05/2014 2000   NITRITE NEGATIVE 02/24/2016 1000   LEUKOCYTESUR NEGATIVE 02/24/2016 1000   Radiological Exams on Admission: Dg Chest 1 View  Result Date: 04/27/2016 CLINICAL DATA:  83 year old female with fall and right hip pain. EXAM: CHEST 1 VIEW COMPARISON:  Chest radiograph dated 02/24/2016 FINDINGS: There is mild cardiomegaly. Mild central and perihilar interstitial streaky density may represent mild vascular congestion. An atypical or viral pneumonia is not entirely excluded. Clinical correlation is recommended. There is no focal consolidation, pleural effusion, or pneumothorax. Coronary vascular calcification as well as atherosclerotic calcification of the aortic arch. There is osteopenia with degenerative changes of the spine. No acute fracture. IMPRESSION: Mild cardiomegaly with possible mild vascular congestion. No focal consolidation. Electronically Signed   By: Anner Crete M.D.   On: 04/27/2016 22:38   Ct Head Wo Contrast  Result Date: 04/27/2016 CLINICAL DATA:  83 year old female with fall. EXAM: CT HEAD WITHOUT CONTRAST CT CERVICAL SPINE WITHOUT CONTRAST TECHNIQUE: Multidetector CT  imaging of the head and cervical spine was performed following the standard protocol without intravenous contrast. Multiplanar CT image reconstructions of the cervical spine were also generated. COMPARISON:  Head CT dated 02/24/2016 FINDINGS: CT HEAD FINDINGS Brain: There is moderate age-related atrophy and chronic microvascular ischemic changes is no acute intracranial hemorrhage. No mass effect or midline shift noted. No intra-axial fluid collection. Vascular: No hyperdense vessel or unexpected calcification. Skull: Normal. Negative for fracture or focal lesion. Sinuses/Orbits: No acute finding. Other: None CT CERVICAL SPINE FINDINGS Alignment: No acute subluxation. New there is straightening of the normal cervical lordosis which may be positional or due to muscle spasm or secondary to degenerative changes. Skull base and  vertebrae: No acute fracture. Incomplete bony fusion of the posterior ring of C1. The bones are osteopenic. Soft tissues and spinal canal: No prevertebral fluid or swelling. No visible canal hematoma. Disc levels: Multilevel degenerative changes with endplate irregularity and disc space narrowing most prominent involving C5-C6 and C6-C7. There is anterior osteophyte at C4-C5 and C5-C6. Multilevel facet hypertrophy with associated neural foramina narrowing most prominent at C3-C4 on the left. Upper chest: Bilateral carotid bulb atherosclerotic plaques. The visualized lung bases are clear. Other: None IMPRESSION: 1. No acute intracranial hemorrhage. Age-related atrophy and chronic microvascular ischemic changes. 2. No acute/traumatic cervical spine pathology. Multilevel degenerative changes. Electronically Signed   By: Anner Crete M.D.   On: 04/27/2016 22:47   Ct Cervical Spine Wo Contrast  Result Date: 04/27/2016 CLINICAL DATA:  83 year old female with fall. EXAM: CT HEAD WITHOUT CONTRAST CT CERVICAL SPINE WITHOUT CONTRAST TECHNIQUE: Multidetector CT imaging of the head and cervical spine  was performed following the standard protocol without intravenous contrast. Multiplanar CT image reconstructions of the cervical spine were also generated. COMPARISON:  Head CT dated 02/24/2016 FINDINGS: CT HEAD FINDINGS Brain: There is moderate age-related atrophy and chronic microvascular ischemic changes is no acute intracranial hemorrhage. No mass effect or midline shift noted. No intra-axial fluid collection. Vascular: No hyperdense vessel or unexpected calcification. Skull: Normal. Negative for fracture or focal lesion. Sinuses/Orbits: No acute finding. Other: None CT CERVICAL SPINE FINDINGS Alignment: No acute subluxation. New there is straightening of the normal cervical lordosis which may be positional or due to muscle spasm or secondary to degenerative changes. Skull base and vertebrae: No acute fracture. Incomplete bony fusion of the posterior ring of C1. The bones are osteopenic. Soft tissues and spinal canal: No prevertebral fluid or swelling. No visible canal hematoma. Disc levels: Multilevel degenerative changes with endplate irregularity and disc space narrowing most prominent involving C5-C6 and C6-C7. There is anterior osteophyte at C4-C5 and C5-C6. Multilevel facet hypertrophy with associated neural foramina narrowing most prominent at C3-C4 on the left. Upper chest: Bilateral carotid bulb atherosclerotic plaques. The visualized lung bases are clear. Other: None IMPRESSION: 1. No acute intracranial hemorrhage. Age-related atrophy and chronic microvascular ischemic changes. 2. No acute/traumatic cervical spine pathology. Multilevel degenerative changes. Electronically Signed   By: Anner Crete M.D.   On: 04/27/2016 22:47   Dg Hip Unilat  With Pelvis 2-3 Views Right  Result Date: 04/27/2016 CLINICAL DATA:  83 year old female with fall and right hip pain. EXAM: DG HIP (WITH OR WITHOUT PELVIS) 2-3V RIGHT COMPARISON:  Radiograph dated 11/05/2014 FINDINGS: There is a minimally displaced fracture  of the base of the right femoral neck with foreshortened appearance of the femoral neck. The femoral head remains in anatomic alignment with the acetabulum. No other acute fracture identified. The bones are osteopenic. There is advanced osteoarthritic changes of the left hip. Soft tissues appear unremarkable. IMPRESSION: Minimally displaced basicervical fracture of the right femoral neck. No dislocation. Advanced osteoarthritic changes of the left hip. Electronically Signed   By: Anner Crete M.D.   On: 04/27/2016 22:34    EKG: Independently reviewed.   Assessment/Plan Principal Problem:   Hip fracture, unspecified laterality, closed, initial encounter ([place]) Active Problems:   Ischemic cardiomyopathy   Hyperlipidemia   Cancer ([place])   Hip fracture ([place])    PLAN:   HIP Fx:  Given increase cardiovascular risk, she should proceed with ORIF as per orthopedics.  Will place foley, made NPO, and give pain medication.  Kidney cancer:  Noted.  Hypothyrodism:  Will continue with supplement.  Check TSH.   DVT prophylaxis: sub Q heparin.  Code Status: FULL CODE ( confirmed tonight) Family Communication: [name] and Debbie at bedside.  Disposition Plan: TBD.  Consults called: Orthopedics:  Dr Aline Brochure.  Admission status: Inpatient.    Delmar Dondero MD FACP. [facility]  If 7PM-7AM, please contact night-coverage [email or web address] Password [facility]  04/28/2016, 12:22 AM

## 2016-04-29 ENCOUNTER — Inpatient Hospital Stay (HOSPITAL_COMMUNITY): Payer: Medicare Other | Admitting: Anesthesiology

## 2016-04-29 ENCOUNTER — Encounter (HOSPITAL_COMMUNITY)
Admission: EM | Disposition: A | Discharge status: Skilled Nursing Facility | Payer: Self-pay | Source: Home / Self Care | Attending: Internal Medicine

## 2016-04-29 ENCOUNTER — Encounter (HOSPITAL_COMMUNITY): Payer: Self-pay | Admitting: *Deleted

## 2016-04-29 ENCOUNTER — Inpatient Hospital Stay (HOSPITAL_COMMUNITY): Payer: Medicare Other

## 2016-04-29 DIAGNOSIS — N390 Urinary tract infection, site not specified: Secondary | ICD-10-CM

## 2016-04-29 HISTORY — PX: HIP ARTHROPLASTY: SHX981

## 2016-04-29 LAB — BASIC METABOLIC PANEL
ANION GAP: 6 (ref 5–15)
BUN: 13 mg/dL (ref 6–20)
CHLORIDE: 99 mmol/L — AB (ref 101–111)
CO2: 29 mmol/L (ref 22–32)
Calcium: 8.3 mg/dL — ABNORMAL LOW (ref 8.9–10.3)
Creatinine, Ser: 1.59 mg/dL — ABNORMAL HIGH (ref 0.44–1.00)
GFR calc Af Amer: 34 mL/min — ABNORMAL LOW (ref >60)
GFR calc non Af Amer: 29 mL/min — ABNORMAL LOW (ref >60)
GLUCOSE: 193 mg/dL — AB (ref 65–99)
POTASSIUM: 3.9 mmol/L (ref 3.5–5.1)
Sodium: 134 mmol/L — ABNORMAL LOW (ref 135–145)

## 2016-04-29 LAB — URINALYSIS, ROUTINE W REFLEX MICROSCOPIC
Bacteria, UA: NONE SEEN
Bilirubin Urine: NEGATIVE
GLUCOSE, UA: NEGATIVE mg/dL
Ketones, ur: NEGATIVE mg/dL
Nitrite: NEGATIVE
PH: 6 (ref 5.0–8.0)
Protein, ur: 30 mg/dL — AB
SPECIFIC GRAVITY, URINE: 1.014 (ref 1.005–1.030)

## 2016-04-29 LAB — GLUCOSE, CAPILLARY
GLUCOSE-CAPILLARY: 215 mg/dL — AB (ref 65–99)
Glucose-Capillary: 133 mg/dL — ABNORMAL HIGH (ref 65–99)
Glucose-Capillary: 154 mg/dL — ABNORMAL HIGH (ref 65–99)
Glucose-Capillary: 159 mg/dL — ABNORMAL HIGH (ref 65–99)
Glucose-Capillary: 172 mg/dL — ABNORMAL HIGH (ref 65–99)
Glucose-Capillary: 174 mg/dL — ABNORMAL HIGH (ref 65–99)
Glucose-Capillary: 184 mg/dL — ABNORMAL HIGH (ref 65–99)
Glucose-Capillary: 185 mg/dL — ABNORMAL HIGH (ref 65–99)

## 2016-04-29 LAB — CBC
HEMATOCRIT: 36.9 % (ref 36.0–46.0)
Hemoglobin: 12.1 g/dL (ref 12.0–15.0)
MCH: 30.3 pg (ref 26.0–34.0)
MCHC: 32.8 g/dL (ref 30.0–36.0)
MCV: 92.3 fL (ref 78.0–100.0)
Platelets: 164 10*3/uL (ref 150–400)
RBC: 4 MIL/uL (ref 3.87–5.11)
RDW: 15.5 % (ref 11.5–15.5)
WBC: 5.3 10*3/uL (ref 4.0–10.5)

## 2016-04-29 SURGERY — HEMIARTHROPLASTY, HIP, DIRECT ANTERIOR APPROACH, FOR FRACTURE
Anesthesia: Spinal | Site: Hip | Laterality: Right

## 2016-04-29 MED ORDER — PROPOFOL 500 MG/50ML IV EMUL
INTRAVENOUS | Status: DC | PRN
  Administered 2016-04-29: 50 ug/kg/min via INTRAVENOUS

## 2016-04-29 MED ORDER — FENTANYL CITRATE (PF) 100 MCG/2ML IJ SOLN
INTRAMUSCULAR | Status: DC | PRN
  Administered 2016-04-29: 25 ug via INTRATHECAL
  Administered 2016-04-29: 25 ug via INTRAVENOUS

## 2016-04-29 MED ORDER — PHENOL 1.4 % MT LIQD: 1.0000 | OROMUCOSAL | Status: DC | PRN

## 2016-04-29 MED ORDER — HYDRALAZINE HCL 20 MG/ML IJ SOLN: 5.0000 mg | INTRAMUSCULAR | Status: DC | PRN

## 2016-04-29 MED ORDER — SODIUM CHLORIDE 0.9 % IJ SOLN
INTRAMUSCULAR | Status: AC
  Filled 2016-04-29: qty 10

## 2016-04-29 MED ORDER — HYDROCODONE-ACETAMINOPHEN 5-325 MG PO TABS
1.0000 | ORAL_TABLET | Freq: Four times a day (QID) | ORAL | Status: DC | PRN
  Administered 2016-04-30 – 2016-05-02 (×5): 2 via ORAL
  Filled 2016-04-29 (×6): qty 2

## 2016-04-29 MED ORDER — EPHEDRINE SULFATE 50 MG/ML IJ SOLN
INTRAMUSCULAR | Status: AC
  Filled 2016-04-29: qty 1

## 2016-04-29 MED ORDER — MIDAZOLAM HCL 2 MG/2ML IJ SOLN
INTRAMUSCULAR | Status: AC
  Filled 2016-04-29: qty 2

## 2016-04-29 MED ORDER — BUPIVACAINE IN DEXTROSE 0.75-8.25 % IT SOLN
INTRATHECAL | Status: DC | PRN
  Administered 2016-04-29: 13 mg via INTRATHECAL

## 2016-04-29 MED ORDER — MIDAZOLAM HCL 2 MG/2ML IJ SOLN
1.0000 mg | INTRAMUSCULAR | Status: AC
  Administered 2016-04-29 (×2): 1 mg via INTRAVENOUS

## 2016-04-29 MED ORDER — MIDAZOLAM HCL 5 MG/5ML IJ SOLN
INTRAMUSCULAR | Status: DC | PRN
  Administered 2016-04-29: 1 mg via INTRAVENOUS

## 2016-04-29 MED ORDER — ACETAMINOPHEN 10 MG/ML IV SOLN
1000.0000 mg | Freq: Four times a day (QID) | INTRAVENOUS | Status: AC
  Administered 2016-04-29 – 2016-04-30 (×3): 1000 mg via INTRAVENOUS
  Filled 2016-04-29 (×4): qty 100

## 2016-04-29 MED ORDER — POVIDONE-IODINE 10 % EX SWAB: 2.0000 "application " | Freq: Once | CUTANEOUS | Status: DC

## 2016-04-29 MED ORDER — SORBITOL 70 % SOLN: 30.0000 mL | Freq: Every day | Status: DC | PRN

## 2016-04-29 MED ORDER — BUPIVACAINE IN DEXTROSE 0.75-8.25 % IT SOLN
INTRATHECAL | Status: AC
  Filled 2016-04-29: qty 2

## 2016-04-29 MED ORDER — MENTHOL 3 MG MT LOZG: 1.0000 | LOZENGE | OROMUCOSAL | Status: DC | PRN

## 2016-04-29 MED ORDER — METOCLOPRAMIDE HCL 10 MG PO TABS: 5.0000 mg | ORAL_TABLET | Freq: Three times a day (TID) | ORAL | Status: DC | PRN

## 2016-04-29 MED ORDER — CEFTRIAXONE SODIUM 1 G IJ SOLR
1.0000 g | INTRAMUSCULAR | Status: DC
Start: 2016-04-29 — End: 2016-05-02
  Administered 2016-04-29 – 2016-05-02 (×4): 1 g via INTRAVENOUS
  Filled 2016-04-29 (×6): qty 10

## 2016-04-29 MED ORDER — SODIUM CHLORIDE 0.9 % IR SOLN
Status: DC | PRN
  Administered 2016-04-29: 3000 mL

## 2016-04-29 MED ORDER — FENTANYL CITRATE (PF) 100 MCG/2ML IJ SOLN
25.0000 ug | Freq: Once | INTRAMUSCULAR | Status: AC
  Administered 2016-04-29: 25 ug via INTRAVENOUS

## 2016-04-29 MED ORDER — BUPIVACAINE-EPINEPHRINE (PF) 0.5% -1:200000 IJ SOLN
INTRAMUSCULAR | Status: DC | PRN
  Administered 2016-04-29: 60 mL

## 2016-04-29 MED ORDER — SODIUM CHLORIDE 0.9 % IJ SOLN
INTRAMUSCULAR | Status: AC
Start: 2016-04-29 — End: 2016-04-29
  Filled 2016-04-29: qty 10

## 2016-04-29 MED ORDER — ATROPINE SULFATE 0.4 MG/ML IJ SOLN
INTRAMUSCULAR | Status: AC
  Filled 2016-04-29: qty 2

## 2016-04-29 MED ORDER — CEFTRIAXONE SODIUM 1 G IJ SOLR
1.0000 g | INTRAMUSCULAR | Status: DC
  Filled 2016-04-29: qty 10

## 2016-04-29 MED ORDER — PROPOFOL 10 MG/ML IV BOLUS
INTRAVENOUS | Status: AC
  Filled 2016-04-29: qty 20

## 2016-04-29 MED ORDER — SENNOSIDES-DOCUSATE SODIUM 8.6-50 MG PO TABS: 1.0000 | ORAL_TABLET | Freq: Every evening | ORAL | Status: DC | PRN

## 2016-04-29 MED ORDER — ALUM & MAG HYDROXIDE-SIMETH 200-200-20 MG/5ML PO SUSP: 30.0000 mL | ORAL | Status: DC | PRN

## 2016-04-29 MED ORDER — ONDANSETRON HCL 4 MG PO TABS: 4.0000 mg | ORAL_TABLET | Freq: Four times a day (QID) | ORAL | Status: DC | PRN

## 2016-04-29 MED ORDER — PHENYLEPHRINE 40 MCG/ML (10ML) SYRINGE FOR IV PUSH (FOR BLOOD PRESSURE SUPPORT)
PREFILLED_SYRINGE | INTRAVENOUS | Status: AC
  Filled 2016-04-29: qty 10

## 2016-04-29 MED ORDER — DOCUSATE SODIUM 100 MG PO CAPS
100.0000 mg | ORAL_CAPSULE | Freq: Two times a day (BID) | ORAL | Status: DC
  Administered 2016-04-29 – 2016-05-02 (×6): 100 mg via ORAL
  Filled 2016-04-29 (×6): qty 1

## 2016-04-29 MED ORDER — ONDANSETRON HCL 4 MG/2ML IJ SOLN: 4.0000 mg | Freq: Four times a day (QID) | INTRAMUSCULAR | Status: DC | PRN

## 2016-04-29 MED ORDER — BUPIVACAINE-EPINEPHRINE (PF) 0.5% -1:200000 IJ SOLN
INTRAMUSCULAR | Status: AC
  Filled 2016-04-29: qty 30

## 2016-04-29 MED ORDER — ASPIRIN EC 325 MG PO TBEC
325.0000 mg | DELAYED_RELEASE_TABLET | Freq: Every day | ORAL | Status: DC
  Administered 2016-04-30 – 2016-05-02 (×3): 325 mg via ORAL
  Filled 2016-04-29 (×3): qty 1

## 2016-04-29 MED ORDER — EPHEDRINE SULFATE 50 MG/ML IJ SOLN
INTRAMUSCULAR | Status: DC | PRN
  Administered 2016-04-29 (×4): 10 mg via INTRAVENOUS

## 2016-04-29 MED ORDER — LACTATED RINGERS IV SOLN
INTRAVENOUS | Status: DC
  Administered 2016-04-29 (×2): via INTRAVENOUS

## 2016-04-29 MED ORDER — TRAMADOL HCL 50 MG PO TABS
50.0000 mg | ORAL_TABLET | Freq: Four times a day (QID) | ORAL | Status: DC | PRN
  Administered 2016-04-30 – 2016-05-01 (×4): 50 mg via ORAL
  Filled 2016-04-29 (×5): qty 1

## 2016-04-29 MED ORDER — HYDROMORPHONE HCL 1 MG/ML IJ SOLN
0.5000 mg | INTRAMUSCULAR | Status: DC | PRN
  Administered 2016-04-29 – 2016-04-30 (×6): 0.5 mg via INTRAVENOUS
  Filled 2016-04-29 (×6): qty 0.5

## 2016-04-29 MED ORDER — CLINDAMYCIN PHOSPHATE 900 MG/50ML IV SOLN
900.0000 mg | INTRAVENOUS | Status: AC
  Administered 2016-04-29: 900 mg via INTRAVENOUS
  Filled 2016-04-29: qty 50

## 2016-04-29 MED ORDER — BUPIVACAINE-EPINEPHRINE (PF) 0.5% -1:200000 IJ SOLN
INTRAMUSCULAR | Status: AC
Start: 2016-04-29 — End: 2016-04-29
  Filled 2016-04-29: qty 30

## 2016-04-29 MED ORDER — PHENYLEPHRINE HCL 10 MG/ML IJ SOLN
INTRAMUSCULAR | Status: DC | PRN
  Administered 2016-04-29 (×4): 80 ug via INTRAVENOUS
  Administered 2016-04-29: 40 ug via INTRAVENOUS

## 2016-04-29 MED ORDER — METOCLOPRAMIDE HCL 5 MG/ML IJ SOLN: 5.0000 mg | Freq: Three times a day (TID) | INTRAMUSCULAR | Status: DC | PRN

## 2016-04-29 MED ORDER — CHLORHEXIDINE GLUCONATE 4 % EX LIQD: 60.0000 mL | Freq: Once | CUTANEOUS | Status: DC

## 2016-04-29 MED ORDER — HYDROMORPHONE HCL 1 MG/ML IJ SOLN: 0.2500 mg | INTRAMUSCULAR | Status: DC | PRN

## 2016-04-29 MED ORDER — PHENYLEPHRINE HCL 10 MG/ML IJ SOLN
INTRAMUSCULAR | Status: AC
  Filled 2016-04-29: qty 1

## 2016-04-29 MED ORDER — 0.9 % SODIUM CHLORIDE (POUR BTL) OPTIME
TOPICAL | Status: DC | PRN
  Administered 2016-04-29: 1000 mL

## 2016-04-29 MED ORDER — FENTANYL CITRATE (PF) 100 MCG/2ML IJ SOLN
INTRAMUSCULAR | Status: AC
  Filled 2016-04-29: qty 2

## 2016-04-29 SURGICAL SUPPLY — 61 items
BAG HAMPER (MISCELLANEOUS) ×3 IMPLANT
BIT DRILL 2.8X128 (BIT) ×2 IMPLANT
BIT DRILL 2.8X128MM (BIT) ×1
BLADE 10 SAFETY STRL DISP (BLADE) ×3 IMPLANT
BLADE HEX COATED 2.75 (ELECTRODE) ×3 IMPLANT
BLADE SAGITTAL 25.0X1.27X90 (BLADE) ×2 IMPLANT
BLADE SAGITTAL 25.0X1.27X90MM (BLADE) ×1
BRUSH FEMORAL CANAL (MISCELLANEOUS) IMPLANT
CAPT HIP HEMI 1 ×2 IMPLANT
CHLORAPREP W/TINT 26ML (MISCELLANEOUS) ×5 IMPLANT
CLOTH BEACON ORANGE TIMEOUT ST (SAFETY) ×3 IMPLANT
COVER LIGHT HANDLE STERIS (MISCELLANEOUS) ×6 IMPLANT
COVER PROBE W GEL 5X96 (DRAPES) ×1 IMPLANT
DECANTER SPIKE VIAL GLASS SM (MISCELLANEOUS) ×6 IMPLANT
DRAPE HIP W/POCKET STRL (DRAPE) ×3 IMPLANT
DRAPE U-SHAPE 47X51 STRL (DRAPES) ×3 IMPLANT
DRSG MEPILEX BORDER 4X12 (GAUZE/BANDAGES/DRESSINGS) ×3 IMPLANT
ELECT REM PT RETURN 9FT ADLT (ELECTROSURGICAL) ×3
ELECTRODE REM PT RTRN 9FT ADLT (ELECTROSURGICAL) ×1 IMPLANT
EVACUATOR 3/16  PVC DRAIN (DRAIN)
EVACUATOR 3/16 PVC DRAIN (DRAIN) IMPLANT
FACESHIELD LNG OPTICON STERILE (SAFETY) ×1 IMPLANT
GLOVE BIOGEL PI IND STRL 7.0 (GLOVE) ×1 IMPLANT
GLOVE BIOGEL PI INDICATOR 7.0 (GLOVE) ×2
GLOVE SKINSENSE NS SZ8.0 LF (GLOVE) ×4
GLOVE SKINSENSE STRL SZ8.0 LF (GLOVE) ×2 IMPLANT
GLOVE SS N UNI LF 8.5 STRL (GLOVE) ×3 IMPLANT
GOWN STRL REUS W/TWL LRG LVL3 (GOWN DISPOSABLE) ×6 IMPLANT
GOWN STRL REUS W/TWL XL LVL3 (GOWN DISPOSABLE) ×3 IMPLANT
HANDPIECE INTERPULSE COAX TIP (DISPOSABLE) ×3
INST SET MAJOR BONE (KITS) ×3 IMPLANT
IV NS IRRIG 3000ML ARTHROMATIC (IV SOLUTION) ×2 IMPLANT
KIT BLADEGUARD II DBL (SET/KITS/TRAYS/PACK) ×3 IMPLANT
KIT ROOM TURNOVER APOR (KITS) ×3 IMPLANT
MANIFOLD NEPTUNE II (INSTRUMENTS) ×3 IMPLANT
MARKER SKIN DUAL TIP RULER LAB (MISCELLANEOUS) ×3 IMPLANT
NDL HYPO 21X1.5 SAFETY (NEEDLE) ×1 IMPLANT
NEEDLE HYPO 21X1.5 SAFETY (NEEDLE) ×3 IMPLANT
NS IRRIG 1000ML POUR BTL (IV SOLUTION) ×3 IMPLANT
PACK TOTAL JOINT (CUSTOM PROCEDURE TRAY) ×3 IMPLANT
PAD ARMBOARD 7.5X6 YLW CONV (MISCELLANEOUS) ×3 IMPLANT
PASSER SUT SWANSON 36MM LOOP (INSTRUMENTS) ×2 IMPLANT
PILLOW HIP ABDUCTION LRG (ORTHOPEDIC SUPPLIES) IMPLANT
PILLOW HIP ABDUCTION MED (ORTHOPEDIC SUPPLIES) ×3 IMPLANT
PIN STMN SNGL STERILE 9X3.6MM (PIN) ×6 IMPLANT
SET BASIN LINEN APH (SET/KITS/TRAYS/PACK) ×3 IMPLANT
SET HNDPC FAN SPRY TIP SCT (DISPOSABLE) ×1 IMPLANT
STAPLER VISISTAT 35W (STAPLE) ×3 IMPLANT
SUT BRALON NAB BRD #1 30IN (SUTURE) ×6 IMPLANT
SUT ETHIBOND 5 LR DA (SUTURE) ×6 IMPLANT
SUT MNCRL 0 VIOLET CTX 36 (SUTURE) ×1 IMPLANT
SUT MON AB 2-0 CT1 36 (SUTURE) ×3 IMPLANT
SUT MONOCRYL 0 CTX 36 (SUTURE) ×2
SUT VIC AB 1 CT1 27 (SUTURE)
SUT VIC AB 1 CT1 27XBRD ANTBC (SUTURE) IMPLANT
SYR 30ML LL (SYRINGE) ×3 IMPLANT
SYR BULB IRRIGATION 50ML (SYRINGE) ×3 IMPLANT
TOWER CARTRIDGE SMART MIX (DISPOSABLE) IMPLANT
TRAY FOLEY CATH SILVER 16FR (SET/KITS/TRAYS/PACK) IMPLANT
WATER STERILE IRR 1000ML POUR (IV SOLUTION) ×6 IMPLANT
YANKAUER SUCT 12FT TUBE ARGYLE (SUCTIONS) ×5 IMPLANT

## 2016-04-29 NOTE — Anesthesia Postprocedure Evaluation (Signed)
Anesthesia Post Note Late Entry for [id number]  Patient: Joann Hunter  Procedure(s) Performed: Procedure(s) (LRB): ARTHROPLASTY BIPOLAR HIP (HEMIARTHROPLASTY) (Right)  Anesthesia Type: Spinal Level of consciousness: awake, oriented and patient cooperative Pain management: pain level controlled Vital Signs Assessment: post-procedure vital signs reviewed and stable Respiratory status: patient connected to face mask oxygen and respiratory function stable Cardiovascular status: stable Postop Assessment: no signs of nausea or vomiting Anesthetic complications: no     Last Vitals:  Vitals:   04/29/16 1400 04/29/16 1420  BP: (!) 110/59 (!) 136/46  Pulse: 96 99  Resp: 16 18  Temp: 37.2 C 37.2 C    Last Pain:  Vitals:   04/29/16 1305  TempSrc:   PainSc: 0-No pain                 ADAMS, AMY A

## 2016-04-29 NOTE — Anesthesia Procedure Notes (Addendum)
Spinal  Patient location during procedure: OR Staffing Anesthesiologist: Patsey Berthold LUIS Resident/CRNA: ADAMS, AMY A Preanesthetic Checklist Completed: patient identified, site marked, surgical consent, pre-op evaluation, timeout performed, IV checked, risks and benefits discussed and monitors and equipment checked Spinal Block Patient position: right lateral decubitus Prep: Betadine Patient monitoring: heart rate, cardiac monitor, continuous pulse ox and blood pressure Approach: right paramedian Location: L3-4 Injection technique: single-shot Needle Needle type: Spinocan  Needle gauge: 22 G Needle length: 9 cm Assessment Sensory level: T8 Additional Notes ATTEMPTS:3; attempt x2 by Andree Elk, CRNA; spinal placed by Dr. Patsey Berthold; patient tolerated well. TRAY ID: TRAY EXPIRATION JOIN:[id number]

## 2016-04-29 NOTE — Progress Notes (Signed)
PROGRESS NOTE                                                                                                                                                                                                             Patient Demographics:    Joann Hunter, is a 83 y.o. female, DOB - Jun 03, 1932, PYK:[id number]  Admit date - 04/27/2016   Admitting Physician Orvan Falconer, MD  Outpatient Primary MD for the patient is [facility], MD  LOS - 1  Outpatient Specialists: Oncology Dr Penland>> Dr Irene Limbo  Chief Complaint  Patient presents with  . Fall    right hip pain       Brief Narrative  83 y.o. female with hx of ischemic CMP, s/p NSTEMI, hx of chronic back pain, uretheral cancer on chemotherapy every 3 weeks, hx of HTN, HLD, Hypothyrodism, type II DM with nephropathy, fell and sustained right hip fracture.   Subjective:    Joann Hunter today has, No headache, No chest pain, No shortness of breath, no cough no fever, reports right hip pain better controlled with pain medicine .   Assessment  & Plan :    Principal Problem:   Hip fracture, unspecified laterality, closed, initial encounter [facility]) Active Problems:   Ischemic cardiomyopathy   Hyperlipidemia   Cancer ([place])   Hip fracture ([place])   Right hip fracture  - secondary to mechanical fall,  - Status post surgical repair by Dr. Kenton Kingfisher on 4/27, continue with when necessary pain medication, PT consult pending  UTI - Patient with fever 101 overnight, urinalysis was checked significant for UTI, she was started on Rocephin, follow on urine cultures  CKD stage IV - At baseline, continue to monitor closely.  Hypothyroidism - Continue Synthroid  Stage IV Urothelial carcinoma, multifocal R ureteral masses with liver metastases - She is on chemotherapy, continue to follow with oncology as an outpatient  Type 2 diabetes - He doesn't appear to be in any home medication, will monitor  closely  CAD.  - She had NSTEMI 04/2011 with 3 vessel CAD s/p PCI.  - She denies any chest pain or shortness of breath, continue with aspirin - We'll continue to monitor volume status closely perioperatively, he received IV Lasix yesterday, will discontinue her IV fluids today .   Code Status : Full  Family Communication  : Granddaughter  and daughter at bedside  Disposition Plan  : Pending PT evaluation after surgery  Consults  :  Orthopedic  Procedures  : ARTHROPLASTY BIPOLAR HIP (HEMIARTHROPLASTY) (Right) by Dr. Aline Brochure on 4/27  DVT Prophylaxis  :   aspirin - SCDs   Lab Results  Component Value Date   PLT 164 04/29/2016    Antibiotics  :   Anti-infectives    Start     Dose/Rate Route Frequency Ordered Stop   04/29/16 1011  clindamycin (CLEOCIN) IVPB 900 mg     900 mg 100 mL/hr over 30 Minutes Intravenous On call to O.R. 04/29/16 1011 04/29/16 1127   04/29/16 1000  cefTRIAXone (ROCEPHIN) injection 1 g  Status:  Discontinued     1 g Intramuscular Every 24 hours 04/29/16 0853 04/29/16 0905   04/29/16 1000  cefTRIAXone (ROCEPHIN) 1 g in dextrose 5 % 50 mL IVPB     1 g 100 mL/hr over 30 Minutes Intravenous Every 24 hours 04/29/16 0905          Objective:   Vitals:   04/29/16 1400 04/29/16 1420 04/29/16 1429 04/29/16 1529  BP: (!) 110/59 (!) 136/46 (!) 134/45 (!) 144/46  Pulse: 96 99 97 91  Resp: 16 18 18 18   Temp: 98.9 F (37.2 C) 98.9 F (37.2 C) 97.3 F (36.3 C) 98 F (36.7 C)  TempSrc:   Oral Oral  SpO2: 99% 97% 94% 90%  Weight:      Height:        Wt Readings from Last 3 Encounters:  04/28/16 74.3 kg (163 lb 12.8 oz)  04/14/16 73.2 kg (161 lb 4.8 oz)  03/24/16 71.2 kg (157 lb)     Intake/Output Summary (Last 24 hours) at 04/29/16 1608 Last data filed at 04/29/16 1309  Gross per 24 hour  Intake             1660 ml  Output             3500 ml  Net            -1840 ml     Physical Exam  Awake Alert, Oriented X 3, Linda into bilaterally, no  wheezing, clear to auscultation  RRR,No Gallops,Rubs or new Murmurs, No Parasternal Heave +ve B.Sounds, Abd Soft, No tenderness,  No rebound - guarding or rigidity. Cyanosis, no clubbing, good pulses bilaterally       Data Review:    CBC  Recent Labs Lab 04/27/16 2252 04/28/16 0600 04/29/16 0607  WBC 7.0 5.5 5.3  HGB 10.2* 9.1* 12.1  HCT 30.2* 27.9* 36.9  PLT 167 185 164  MCV 94.1 95.2 92.3  MCH 31.8 31.1 30.3  MCHC 33.8 32.6 32.8  RDW 13.4 13.5 15.5  LYMPHSABS 0.7  --   --   MONOABS 0.5  --   --   EOSABS 0.2  --   --   BASOSABS 0.0  --   --     Chemistries   Recent Labs Lab 04/27/16 2252 04/28/16 0600 04/29/16 0607  NA 133* 136 134*  K 3.7 4.1 3.9  CL 102 104 99*  CO2 24 27 29   GLUCOSE 234* 165* 193*  BUN 14 13 13   CREATININE 1.79* 1.65* 1.59*  CALCIUM 8.5* 8.2* 8.3*  AST  --  19  --   ALT  --  12*  --   ALKPHOS  --  96  --   BILITOT  --  0.5  --    ------------------------------------------------------------------------------------------------------------------ No  results for input(s): CHOL, HDL, LDLCALC, TRIG, CHOLHDL, LDLDIRECT in the last 72 hours.  Lab Results  Component Value Date   HGBA1C 6.3 (H) 02/24/2016   ------------------------------------------------------------------------------------------------------------------  Recent Labs  04/27/16 2252  TSH 2.360   ------------------------------------------------------------------------------------------------------------------ No results for input(s): VITAMINB12, FOLATE, FERRITIN, TIBC, IRON, RETICCTPCT in the last 72 hours.  Coagulation profile No results for input(s): INR, PROTIME in the last 168 hours.  No results for input(s): DDIMER in the last 72 hours.  Cardiac Enzymes No results for input(s): CKMB, TROPONINI, MYOGLOBIN in the last 168 hours.  Invalid input(s): CK ------------------------------------------------------------------------------------------------------------------     Component Value Date/Time   BNP 155.0 (H) 07/26/2014 0557    Inpatient Medications  Scheduled Meds: . [START ON 04/30/2016] aspirin EC  325 mg Oral Q breakfast  . docusate sodium  100 mg Oral BID  . insulin aspart  0-9 Units Subcutaneous Q4H  . levothyroxine  112 mcg Oral q morning - 10a  . mouth rinse  15 mL Mouth Rinse BID  . metoprolol tartrate  25 mg Oral BID  . omega-3 acid ethyl esters  1 g Oral Daily  . pantoprazole  40 mg Oral Daily  . sodium chloride flush  3 mL Intravenous Q12H   Continuous Infusions: . acetaminophen    . cefTRIAXone (ROCEPHIN)  IV 1 g (04/29/16 1549)  . dextrose 5 % and 0.9% NaCl 50 mL/hr at 04/29/16 1549   PRN Meds:.alum & mag hydroxide-simeth, hydrALAZINE, HYDROcodone-acetaminophen, HYDROmorphone (DILAUDID) injection, menthol-cetylpyridinium **OR** phenol, metoCLOPramide **OR** metoCLOPramide (REGLAN) injection, ondansetron **OR** ondansetron (ZOFRAN) IV, senna-docusate, sorbitol, traMADol  Micro Results Recent Results (from the past 240 hour(s))  Surgical PCR screen     Status: None   Collection Time: 04/28/16  3:26 AM  Result Value Ref Range Status   MRSA, PCR NEGATIVE NEGATIVE Final   Staphylococcus aureus NEGATIVE NEGATIVE Final    Comment:        The Xpert SA Assay (FDA approved for NASAL specimens in patients over 71 years of age), is one component of a comprehensive surveillance program.  Test performance has been validated by [facility] for patients greater than or equal to 70 year old. It is not intended to diagnose infection nor to guide or monitor treatment.     Radiology Reports Dg Chest 1 View  Result Date: 04/27/2016 CLINICAL DATA:  83 year old female with fall and right hip pain. EXAM: CHEST 1 VIEW COMPARISON:  Chest radiograph dated 02/24/2016 FINDINGS: There is mild cardiomegaly. Mild central and perihilar interstitial streaky density may represent mild vascular congestion. An atypical or viral pneumonia is not  entirely excluded. Clinical correlation is recommended. There is no focal consolidation, pleural effusion, or pneumothorax. Coronary vascular calcification as well as atherosclerotic calcification of the aortic arch. There is osteopenia with degenerative changes of the spine. No acute fracture. IMPRESSION: Mild cardiomegaly with possible mild vascular congestion. No focal consolidation. Electronically Signed   By: Anner Crete M.D.   On: 04/27/2016 22:38   Dg Elbow Complete Right  Result Date: 04/01/2016 CLINICAL DATA:  Patient fell today at home. States that she was not dizzy, but cannot remember what caused her to fall. Family states that she was taken off of her blood pressure meds because they were causing hypotension. States that she has fallen twice this week and was seen by her physician on Tuesday for the fall. She had hit her head and had multiple bruises on her hands and arms. EXAM: RIGHT ELBOW - COMPLETE 3+ VIEW  COMPARISON:  None. FINDINGS: No fracture. The elbow joint is normally spaced and aligned. No significant arthropathic change. No joint effusion. There is mild posterior subcutaneous soft tissue edema. IMPRESSION: No fracture or dislocation. Electronically Signed   By: Lajean Manes M.D.   On: 04/01/2016 20:41   Dg Forearm Right  Result Date: 04/01/2016 CLINICAL DATA:  Patient fell today at home. States that she was not dizzy, but cannot remember what caused her to fall. Family states that she was taken off of her blood pressure meds because they were causing hypotension. States that she has fallen twice this week and was seen by her physician on Tuesday for the fall. She had hit her head and had multiple bruises on her hands and arms. EXAM: RIGHT FOREARM - 2 VIEW COMPARISON:  None. FINDINGS: No fracture.  No bone lesion.  The bones are demineralized. The elbow and wrist joints are normally aligned. The soft tissues are unremarkable. IMPRESSION: No fracture, bone lesion or dislocation.  Electronically Signed   By: Lajean Manes M.D.   On: 04/01/2016 20:41   Ct Head Wo Contrast  Result Date: 04/27/2016 CLINICAL DATA:  83 year old female with fall. EXAM: CT HEAD WITHOUT CONTRAST CT CERVICAL SPINE WITHOUT CONTRAST TECHNIQUE: Multidetector CT imaging of the head and cervical spine was performed following the standard protocol without intravenous contrast. Multiplanar CT image reconstructions of the cervical spine were also generated. COMPARISON:  Head CT dated 02/24/2016 FINDINGS: CT HEAD FINDINGS Brain: There is moderate age-related atrophy and chronic microvascular ischemic changes is no acute intracranial hemorrhage. No mass effect or midline shift noted. No intra-axial fluid collection. Vascular: No hyperdense vessel or unexpected calcification. Skull: Normal. Negative for fracture or focal lesion. Sinuses/Orbits: No acute finding. Other: None CT CERVICAL SPINE FINDINGS Alignment: No acute subluxation. New there is straightening of the normal cervical lordosis which may be positional or due to muscle spasm or secondary to degenerative changes. Skull base and vertebrae: No acute fracture. Incomplete bony fusion of the posterior ring of C1. The bones are osteopenic. Soft tissues and spinal canal: No prevertebral fluid or swelling. No visible canal hematoma. Disc levels: Multilevel degenerative changes with endplate irregularity and disc space narrowing most prominent involving C5-C6 and C6-C7. There is anterior osteophyte at C4-C5 and C5-C6. Multilevel facet hypertrophy with associated neural foramina narrowing most prominent at C3-C4 on the left. Upper chest: Bilateral carotid bulb atherosclerotic plaques. The visualized lung bases are clear. Other: None IMPRESSION: 1. No acute intracranial hemorrhage. Age-related atrophy and chronic microvascular ischemic changes. 2. No acute/traumatic cervical spine pathology. Multilevel degenerative changes. Electronically Signed   By: Anner Crete M.D.    On: 04/27/2016 22:47   Ct Head Wo Contrast  Result Date: 04/18/2016 CLINICAL DATA:  New headaches, dizziness and falls for 3 weeks. History of stage IV urothelial carcinoma. EXAM: CT HEAD WITHOUT CONTRAST TECHNIQUE: Contiguous axial images were obtained from the base of the skull through the vertex without intravenous contrast. COMPARISON:  CT HEAD February 21st 1,018 FINDINGS: BRAIN: No intraparenchymal hemorrhage, mass effect nor midline shift. The ventricles and sulci are normal for age. Patchy supratentorial white matter hypodensities within normal range for patient's age, though non-specific are most compatible with chronic small vessel ischemic disease. Old LEFT pontine lacunar infarct. Old LEFT cerebellar small infarcts. No acute large vascular territory infarcts. No abnormal extra-axial fluid collections. Basal cisterns are patent. VASCULAR: Moderate calcific atherosclerosis of the carotid siphons. SKULL: No skull fracture. Moderate RIGHT temporomandibular osteoarthrosis. Osteopenia. No significant scalp  soft tissue swelling. SINUSES/ORBITS: Trace paranasal sinus mucosal thickening. Mastoid air cells are well aerated. The included ocular globes and orbital contents are non-suspicious. OTHER: Patient is edentulous. IMPRESSION: No acute intracranial process. Stable examination including moderate chronic small vessel ischemic disease and old small LEFT cerebellar infarcts. Electronically Signed   By: Elon Alas M.D.   On: 04/18/2016 22:23   Ct Cervical Spine Wo Contrast  Result Date: 04/27/2016 CLINICAL DATA:  83 year old female with fall. EXAM: CT HEAD WITHOUT CONTRAST CT CERVICAL SPINE WITHOUT CONTRAST TECHNIQUE: Multidetector CT imaging of the head and cervical spine was performed following the standard protocol without intravenous contrast. Multiplanar CT image reconstructions of the cervical spine were also generated. COMPARISON:  Head CT dated 02/24/2016 FINDINGS: CT HEAD FINDINGS Brain:  There is moderate age-related atrophy and chronic microvascular ischemic changes is no acute intracranial hemorrhage. No mass effect or midline shift noted. No intra-axial fluid collection. Vascular: No hyperdense vessel or unexpected calcification. Skull: Normal. Negative for fracture or focal lesion. Sinuses/Orbits: No acute finding. Other: None CT CERVICAL SPINE FINDINGS Alignment: No acute subluxation. New there is straightening of the normal cervical lordosis which may be positional or due to muscle spasm or secondary to degenerative changes. Skull base and vertebrae: No acute fracture. Incomplete bony fusion of the posterior ring of C1. The bones are osteopenic. Soft tissues and spinal canal: No prevertebral fluid or swelling. No visible canal hematoma. Disc levels: Multilevel degenerative changes with endplate irregularity and disc space narrowing most prominent involving C5-C6 and C6-C7. There is anterior osteophyte at C4-C5 and C5-C6. Multilevel facet hypertrophy with associated neural foramina narrowing most prominent at C3-C4 on the left. Upper chest: Bilateral carotid bulb atherosclerotic plaques. The visualized lung bases are clear. Other: None IMPRESSION: 1. No acute intracranial hemorrhage. Age-related atrophy and chronic microvascular ischemic changes. 2. No acute/traumatic cervical spine pathology. Multilevel degenerative changes. Electronically Signed   By: Anner Crete M.D.   On: 04/27/2016 22:47   Dg Pelvis Portable  Result Date: 04/29/2016 CLINICAL DATA:  Postop for right hip replacement. EXAM: PORTABLE PELVIS 1-2 VIEWS COMPARISON:  CT of 10/28/2015 FINDINGS: AP view of the pelvis demonstrates right hip arthroplasty. No periprosthetic fracture or acute abnormality identified. Advanced left hip osteoarthritis. Degenerative disc disease at the lumbosacral junction. IMPRESSION: Expected appearance after right hip arthroplasty. Advanced left hip osteoarthritis. Electronically Signed   By:  Abigail Miyamoto M.D.   On: 04/29/2016 13:40   Dg Hip Unilat  With Pelvis 2-3 Views Right  Result Date: 04/27/2016 CLINICAL DATA:  83 year old female with fall and right hip pain. EXAM: DG HIP (WITH OR WITHOUT PELVIS) 2-3V RIGHT COMPARISON:  Radiograph dated 11/05/2014 FINDINGS: There is a minimally displaced fracture of the base of the right femoral neck with foreshortened appearance of the femoral neck. The femoral head remains in anatomic alignment with the acetabulum. No other acute fracture identified. The bones are osteopenic. There is advanced osteoarthritic changes of the left hip. Soft tissues appear unremarkable. IMPRESSION: Minimally displaced basicervical fracture of the right femoral neck. No dislocation. Advanced osteoarthritic changes of the left hip. Electronically Signed   By: Anner Crete M.D.   On: 04/27/2016 22:34     Amberley Hamler M.D on 04/29/2016 at 4:08 PM  Between 7am to 7pm - Pager - [phone]  After 7pm go to [email or web address] - password [facility]  [facility] -  Office  [phone]

## 2016-04-29 NOTE — Progress Notes (Signed)
Patient ID: Joann Hunter, female   DOB: [birth year], 83 y.o.   MRN: [id number] BP (!) 172/83 (BP Location: Right Arm)   Pulse 88   Temp 98.9 F (37.2 C) (Oral)   Resp 20   Ht 5\' 1"  (1.549 m)   Wt 163 lb 12.8 oz (74.3 kg)   SpO2 95%   BMI 30.95 kg/m    CBC Latest Ref Rng & Units 04/29/2016 04/28/2016 04/27/2016  WBC 4.0 - 10.5 K/uL 5.3 5.5 7.0  Hemoglobin 12.0 - 15.0 g/dL 12.1 9.1(L) 10.2(L)  Hematocrit 36.0 - 46.0 % 36.9 27.9(L) 30.2(L)  Platelets 150 - 400 K/uL 164 185 167     BMP Latest Ref Rng & Units 04/29/2016 04/28/2016 04/27/2016  Glucose 65 - 99 mg/dL 193(H) 165(H) 234(H)  BUN 6 - 20 mg/dL 13 13 14   Creatinine 0.44 - 1.00 mg/dL 1.59(H) 1.65(H) 1.79(H)  Sodium 135 - 145 mmol/L 134(L) 136 133(L)  Potassium 3.5 - 5.1 mmol/L 3.9 4.1 3.7  Chloride 101 - 111 mmol/L 99(L) 104 102  CO2 22 - 32 mmol/L 29 27 24   Calcium 8.9 - 10.3 mg/dL 8.3(L) 8.2(L) 8.5(L)    RIGHT FEMORAL NECK FRACTURE   FOR RT HIP PARTIAL REPLACEMENT TODAY

## 2016-04-29 NOTE — Transfer of Care (Signed)
Immediate Anesthesia Transfer of Care Note  Patient: Joann Hunter  Procedure(s) Performed: Procedure(s): ARTHROPLASTY BIPOLAR HIP (HEMIARTHROPLASTY) (Right)  Patient Location: PACU  Anesthesia Type:Spinal  Level of Consciousness: awake, oriented and patient cooperative  Airway & Oxygen Therapy: Patient Spontanous Breathing and Patient connected to nasal cannula oxygen  Post-op Assessment: Report given to RN and Post -op Vital signs reviewed and stable  Post vital signs: Reviewed and stable  Last Vitals:  Vitals:   04/29/16 1023 04/29/16 1026  BP:    Pulse:    Resp:  14  Temp: 37.1 C     Last Pain:  Vitals:   04/29/16 0837  TempSrc:   PainSc: Asleep      Patients Stated Pain Goal: 2 (84/69/62 9528)  Complications: No apparent anesthesia complications

## 2016-04-29 NOTE — Brief Op Note (Signed)
04/27/2016 - 04/29/2016  1:09 PM  PATIENT:  Joann Hunter  83 y.o. female  PRE-OPERATIVE DIAGNOSIS:  right femoral neck fracture  POST-OPERATIVE DIAGNOSIS:  right femoral neck fracture  PROCEDURE:  Procedure(s): ARTHROPLASTY BIPOLAR HIP (HEMIARTHROPLASTY) (Right)   DEPUY [place] basic press-fit size 7 stem, size 49 head, size 1.5 neck length  Surgical findings displaced right femoral neck fracture, minimal arthritic changes of the acetabulum and she had a Door type III femur  Direct lateral approach  Details of procedure Bipolar replacement right hip  The patient's site of surgery was confirmed and marked in the preop area. Chart update and review completed  She was taken to the operating room and given clindamycin secondary to penicillin allergy  After successful spinal anesthetic the patient was placed in the lateral decubitus position with the left side down and padding of the axilla  After sterile prep and drape and timeout we started to surgery  Admit a direct lateral incision over the greater trochanter and curved slightly posteriorly. I divided the subcutaneous tissue down to the fascia. I pierced the fascia and extended the length of the incision. Identified the abductors.  With blunt dissection I divided the abductors and half and took them off of the greater trochanter with subperiosteal dissection. I retracted the abductors with 2 Steinmann pins. I did a capsulectomy. I dislocated the hip anteriorly and remove the femoral head.  The femoral head measured 49 mm.  I inspected the acetabulum and found no significant arthritic changes.  I prepared the proximal femur: I made a provisional neck cut using a neck cutting guide. I reference the greater trochanter.  I used a box osteotome to enter the femoral canal followed by making a pilot hole. I then placed a canal finder as well as a trochanteric reamer. I started broaching was a size 3 and broached up to a size 7.  A  trial of the hip with a size 1.5 neck and a size 49 bipolar head  I was satisfied there with the reduction as the hip was stable in flexion internal rotation (90/50) as well as extension and external rotation (5/50) sleep position was stable shuck test was normal and leg lengths were checked by checking the knee and heel of the opposite leg  Remove the trial implants and I placed 2 drill holes in the femur and passed a #5 Ethibond suture  After thorough irrigation of the acetabulum and the femur we placed the implant without any complications.  After all implants were placed we reduced the hip and took it through a trial reduction again and I was satisfied with the reduction as previously stated  We injected the abductors and hip capsule with Marcaine with epinephrine 30 mL I closed the abductor split using the #5 Ethibond suture as well as oversewing the attachment with #1 Bralon  We placed the hip in abduction close the fascia with running #1 Bralon and then injected 30 mL of Marcaine with epinephrine beneath the fascia  I closed the subcutaneous tissue with 2 layers of 0 Monocryl suture and then applied staples to close the skin. A sterile dressing was applied  Postoperatively we will follow direct lateral hip precautions. The patient will be weightbearing as tolerated. We recommend aspirin for 28 days for DVT prophylaxis. The sutures were called mild on postop day 14  a follow-up visit should be scheduled between postop day 14 and postop day 28.   Assisted by Marquita Palms  Spinal anesthesia  Estimated blood loss 100 mL There was no drain  No specimen  Marcaine with epinephrine 60 mL total  Counts were correct  Dragon dictation  [id number]  SURGEON:  Surgeon(s) and Role:    * Carole Civil, MD - Primary   ANESTHESIA:   spinal  EBL:  Total I/O In: 1400 [I.V.:1400] Out: 300 [Urine:200; Blood:100] Counts were correct   TOURNIQUET:  * No tourniquets in log  *  DICTATION: .Dragon Dictation  PLAN OF CARE: Admit to inpatient   PATIENT DISPOSITION:  PACU - hemodynamically stable.   Delay start of Pharmacological VTE agent (>24hrs) due to surgical blood loss or risk of bleeding: yes

## 2016-04-29 NOTE — Op Note (Signed)
04/29/2016  1:09 PM  PATIENT:  Joann Hunter  83 y.o. female  PRE-OPERATIVE DIAGNOSIS:  right femoral neck fracture  POST-OPERATIVE DIAGNOSIS:  right femoral neck fracture  PROCEDURE:  Procedure(s): ARTHROPLASTY BIPOLAR HIP (HEMIARTHROPLASTY) (Right)   DEPUY [place] basic press-fit size 7 stem, size 49 head, size 1.5 neck length  Surgical findings displaced right femoral neck fracture, minimal arthritic changes of the acetabulum and she had a Door type III femur  Direct lateral approach  Details of procedure Bipolar replacement right hip  The patient's site of surgery was confirmed and marked in the preop area. Chart update and review completed  She was taken to the operating room and given clindamycin secondary to penicillin allergy  After successful spinal anesthetic the patient was placed in the lateral decubitus position with the left side down and padding of the axilla  After sterile prep and drape and timeout we started to surgery  Admit a direct lateral incision over the greater trochanter and curved slightly posteriorly. I divided the subcutaneous tissue down to the fascia. I pierced the fascia and extended the length of the incision. Identified the abductors.  With blunt dissection I divided the abductors and half and took them off of the greater trochanter with subperiosteal dissection. I retracted the abductors with 2 Steinmann pins. I did a capsulectomy. I dislocated the hip anteriorly and remove the femoral head.  The femoral head measured 49 mm.  I inspected the acetabulum and found no significant arthritic changes.  I prepared the proximal femur: I made a provisional neck cut using a neck cutting guide. I reference the greater trochanter.  I used a box osteotome to enter the femoral canal followed by making a pilot hole. I then placed a canal finder as well as a trochanteric reamer. I started broaching was a size 3 and broached up to a size 7.  A trial of the  hip with a size 1.5 neck and a size 49 bipolar head  I was satisfied there with the reduction as the hip was stable in flexion internal rotation (90/50) as well as extension and external rotation (5/50) sleep position was stable shuck test was normal and leg lengths were checked by checking the knee and heel of the opposite leg  Remove the trial implants and I placed 2 drill holes in the femur and passed a #5 Ethibond suture  After thorough irrigation of the acetabulum and the femur we placed the implant without any complications.  After all implants were placed we reduced the hip and took it through a trial reduction again and I was satisfied with the reduction as previously stated  We injected the abductors and hip capsule with Marcaine with epinephrine 30 mL I closed the abductor split using the #5 Ethibond suture as well as oversewing the attachment with #1 Bralon  We placed the hip in abduction close the fascia with running #1 Bralon and then injected 30 mL of Marcaine with epinephrine beneath the fascia  I closed the subcutaneous tissue with 2 layers of 0 Monocryl suture and then applied staples to close the skin. A sterile dressing was applied  Postoperatively we will follow direct lateral hip precautions. The patient will be weightbearing as tolerated. We recommend aspirin for 28 days for DVT prophylaxis. The sutures were called mild on postop day 14  a follow-up visit should be scheduled between postop day 14 and postop day 28.   Assisted by Marquita Palms  Spinal anesthesia  Estimated blood  loss 100 mL There was no drain  No specimen  Marcaine with epinephrine 60 mL total  Counts were correct  Dragon dictation  [phone]  SURGEON:  Surgeon(s) and Role:    * Carole Civil, MD - Primary   ANESTHESIA:   spinal  EBL:  Total I/O In: 1400 [I.V.:1400] Out: 300 [Urine:200; Blood:100] Counts were correct   TOURNIQUET:  * No tourniquets in log *  DICTATION:  .Dragon Dictation  PLAN OF CARE: Admit to inpatient   PATIENT DISPOSITION:  PACU - hemodynamically stable.   Delay start of Pharmacological VTE agent (>24hrs) due to surgical blood loss or risk of bleeding: yes

## 2016-04-29 NOTE — ACP (Advance Care Planning) (Signed)
Assisted with patient's request for a [place]. A copy was placed in her chart along with copies/original given to her.

## 2016-04-29 NOTE — Progress Notes (Signed)
Patient sleeping(still drowsy from anesthesia per family), family declined IS instruction for patient at this time. RT will return in the AM to instruct patient on use of the IS.Marland Kitchen

## 2016-04-29 NOTE — Anesthesia Preprocedure Evaluation (Signed)
Anesthesia Evaluation  Patient identified by MRN, date of birth, ID band Patient awake    Reviewed: Allergy & Precautions, NPO status , Patient's Chart, lab work & pertinent test results  Airway Mallampati: IV  TM Distance: <3 FB     Dental  (+) Edentulous Upper, Edentulous Lower   Pulmonary neg pulmonary ROS,    breath sounds clear to auscultation       Cardiovascular hypertension, Pt. on medications (-) angina+ CAD, + Past MI and +CHF (EF=45%)   Rhythm:Regular Rate:Normal     Neuro/Psych    GI/Hepatic negative GI ROS,   Endo/Other  diabetes, Type 2Hypothyroidism   Renal/GU Renal disease     Musculoskeletal   Abdominal   Peds  Hematology   Anesthesia Other Findings   Reproductive/Obstetrics                             Anesthesia Physical Anesthesia Plan  ASA: III  Anesthesia Plan: Spinal   Post-op Pain Management:    Induction:   Airway Management Planned: Simple Face Mask  Additional Equipment:   Intra-op Plan:   Post-operative Plan:   Informed Consent: I have reviewed the patients History and Physical, chart, labs and discussed the procedure including the risks, benefits and alternatives for the proposed anesthesia with the patient or authorized representative who has indicated his/her understanding and acceptance.     Plan Discussed with:   Anesthesia Plan Comments:         Anesthesia Quick Evaluation

## 2016-04-29 NOTE — Anesthesia Procedure Notes (Signed)
Procedure Name: MAC Date/Time: 04/29/2016 10:57 AM Performed by: Andree Elk, AMY A Pre-anesthesia Checklist: Patient identified, Timeout performed, Emergency Drugs available and Suction available Oxygen Delivery Method: Simple face mask

## 2016-04-29 NOTE — Care Management Important Message (Signed)
Important Message  Patient Details  Name: Joann Hunter MRN: [id number] Date of Birth: [birth year]   Medicare Important Message Given:  Yes    Sherald Barge, RN 04/29/2016, 1:20 PM

## 2016-04-29 NOTE — Consult Note (Signed)
  BP (!) 172/83 (BP Location: Right Arm)   Pulse 88   Temp 98.7 F (37.1 C)   Resp 14   Ht 5\' 1"  (1.549 m)   Wt 163 lb 12.8 oz (74.3 kg)   SpO2 97%   BMI 30.95 kg/m   Right partial; hip replacement   CBC Latest Ref Rng & Units 04/29/2016 04/28/2016 04/27/2016  WBC 4.0 - 10.5 K/uL 5.3 5.5 7.0  Hemoglobin 12.0 - 15.0 g/dL 12.1 9.1(L) 10.2(L)  Hematocrit 36.0 - 46.0 % 36.9 27.9(L) 30.2(L)  Platelets 150 - 400 K/uL 164 185 167

## 2016-04-30 DIAGNOSIS — I255 Ischemic cardiomyopathy: Secondary | ICD-10-CM

## 2016-04-30 LAB — GLUCOSE, CAPILLARY
GLUCOSE-CAPILLARY: 121 mg/dL — AB (ref 65–99)
GLUCOSE-CAPILLARY: 115 mg/dL — AB (ref 65–99)
GLUCOSE-CAPILLARY: 203 mg/dL — AB (ref 65–99)
Glucose-Capillary: 127 mg/dL — ABNORMAL HIGH (ref 65–99)
Glucose-Capillary: 144 mg/dL — ABNORMAL HIGH (ref 65–99)

## 2016-04-30 LAB — CBC
HCT: 30.1 % — ABNORMAL LOW (ref 36.0–46.0)
Hemoglobin: 9.9 g/dL — ABNORMAL LOW (ref 12.0–15.0)
MCH: 30.5 pg (ref 26.0–34.0)
MCHC: 32.9 g/dL (ref 30.0–36.0)
MCV: 92.6 fL (ref 78.0–100.0)
Platelets: 153 10*3/uL (ref 150–400)
RBC: 3.25 MIL/uL — AB (ref 3.87–5.11)
RDW: 15 % (ref 11.5–15.5)
WBC: 5.3 10*3/uL (ref 4.0–10.5)

## 2016-04-30 LAB — BASIC METABOLIC PANEL
ANION GAP: 6 (ref 5–15)
BUN: 15 mg/dL (ref 6–20)
CO2: 28 mmol/L (ref 22–32)
Calcium: 8 mg/dL — ABNORMAL LOW (ref 8.9–10.3)
Chloride: 100 mmol/L — ABNORMAL LOW (ref 101–111)
Creatinine, Ser: 1.44 mg/dL — ABNORMAL HIGH (ref 0.44–1.00)
GFR calc non Af Amer: 33 mL/min — ABNORMAL LOW (ref >60)
GFR, EST AFRICAN AMERICAN: 38 mL/min — AB (ref >60)
Glucose, Bld: 135 mg/dL — ABNORMAL HIGH (ref 65–99)
POTASSIUM: 3.8 mmol/L (ref 3.5–5.1)
SODIUM: 134 mmol/L — AB (ref 135–145)

## 2016-04-30 MED ORDER — SACCHAROMYCES BOULARDII 250 MG PO CAPS
250.0000 mg | ORAL_CAPSULE | Freq: Two times a day (BID) | ORAL | Status: DC
  Administered 2016-04-30 – 2016-05-02 (×5): 250 mg via ORAL
  Filled 2016-04-30 (×5): qty 1

## 2016-04-30 MED ORDER — FUROSEMIDE 10 MG/ML IJ SOLN
40.0000 mg | Freq: Once | INTRAMUSCULAR | Status: AC
  Administered 2016-04-30: 40 mg via INTRAVENOUS
  Filled 2016-04-30: qty 4

## 2016-04-30 NOTE — Anesthesia Postprocedure Evaluation (Signed)
Anesthesia Post Note  Patient: LORRETTA KERCE  Procedure(s) Performed: Procedure(s) (LRB): ARTHROPLASTY BIPOLAR HIP (HEMIARTHROPLASTY) (Right)  Patient location during evaluation: Nursing Unit Anesthesia Type: Spinal Level of consciousness: awake and alert, oriented and patient cooperative Pain management: pain level controlled Vital Signs Assessment: post-procedure vital signs reviewed and stable Respiratory status: spontaneous breathing, patient connected to nasal cannula oxygen and respiratory function stable Cardiovascular status: stable Postop Assessment: no signs of nausea or vomiting Anesthetic complications: no     Last Vitals:  Vitals:   04/30/16 1043 04/30/16 1344  BP:    Pulse: 72 73  Resp:    Temp:  36.9 C    Last Pain:  Vitals:   04/30/16 1344  TempSrc: Oral  PainSc:                  ADAMS, AMY A

## 2016-04-30 NOTE — Evaluation (Signed)
Physical Therapy Evaluation Patient Details Name: Joann Hunter MRN: [id number] DOB: February 01, 1932 Today's Date: 04/30/2016   History of Present Illness  Joann Hunter is an 83 y.o. female with hx of ischemic CMP, s/p NSTEMI, hx of chronic back pain, uretheral cancer on chemotherapy every 3 weeks, hx of HTN, HLD, Hypothyrodism, type II DM with nephropathy, fell and hurt her right hip.  X ray showed right hip Fx.  CXR showed possible mild vascular congestion.  Serology showed Cr of 1.8, BS of 234, and Hb of 10 g per dL.  EKG showed NSR with no acute ST T changes. Head CT and cervical CT showed no acute pathology.  Dr Aline Brochure was consulted by EDP and hospitalist was asked to admit her for surgical repair.   She has no CP/SOB, but was hypoxic with Sat to 86 percent, corrected to 94% on 3 L .  Clinical Impression  Pt received lying in bed and was agreeable to PT evaluation. Pt's dtr initially present at beginning of session but exited the room after a few minutes. Pt expressed that she had to use the restroom so PT and a nursing aide assisted pt onto the bed pan. She was max A for rolling and required cueing and assistance to maintain R lateral hip precautions during roll. Per pt, she is from home where she has 24/7 care available from family and was ambulating with a RW modified independently. PT reviewed her lateral hip precautions with her and left a handout in the room. PT also went through bed exercises with pt and left a handout in the room for pt to perform 3x/day. As PT was trying to get pt to the EOB and attempt standing, pt stated that she had too much pain in her R hip and she could not do it. She stated that she could not lift her leg; educated pt that that's what PT would help with, assisting her to EOB. At each attempt to assist pt to EOB, she had an increased posterior trunk lean and would resist assistance. Provided pt with extensive education on importance and benefits of moving her R hip;  she verbalized understanding but still refused to try and sit EOB. PT educated pt that I would return tomorrow and that we would get EOB and attempt standing and she verbalized understanding again. During session, pt stated that she was afraid of having another heart attack as she has been under a lot of stress. Pt denied chest pain but did report SOB, however, her vitals were WNL as her HR was 72 and O2 sats 96% on 1.5L O2. Recommending SNF as pt is well below baseline level of functioning at this time.     Follow Up Recommendations SNF    Equipment Recommendations  None recommended by PT    Recommendations for Other Services       Precautions / Restrictions Precautions Precautions: Fall Precaution Comments: Lateral hip precautions      Mobility  Bed Mobility Overal bed mobility: Needs Assistance Bed Mobility: Rolling Rolling: Max assist         General bed mobility comments: Max A for rolling to the L and required assitance to maintain R lateral hip precautions. Attempted to get pt EOB multiple times but she stated she was in too much pain and refused to try. With each attempt at getting pt EOB, she had an increased posterior trunk lean preventing the transfer. Educated pt that the more she moves the better her hip  will feel; she continued to state that she was in too much pain and didn't want to try. Educated pt that PT would be back tomorrow to get her EOB and standing.   Transfers                    Ambulation/Gait                Stairs            Wheelchair Mobility    Modified Rankin (Stroke Patients Only)       Balance Overall balance assessment: History of Falls                                           Pertinent Vitals/Pain Pain Assessment: 0-10 Pain Score: 10-Worst pain ever Pain Location: R hip Pain Descriptors / Indicators:  ("hurting right much right now") Pain Intervention(s): Limited activity within patient's  tolerance;Monitored during session;Repositioned    Home Living Family/patient expects to be discharged to:: Private residence Living Arrangements: Spouse/significant other;Children Available Help at Discharge: Family Type of Home: House Home Access: Stairs to enter Entrance Stairs-Rails: None Entrance Stairs-Number of Steps: 2 Home Layout: One level Home Equipment: Environmental consultant - 2 wheels;Cane - single point;Shower seat      Prior Function Level of Independence: Independent with assistive device(s)         Comments: Indepdent with RW      Hand Dominance        Extremity/Trunk Assessment   Upper Extremity Assessment Upper Extremity Assessment: Overall WFL for tasks assessed    Lower Extremity Assessment Lower Extremity Assessment: RLE deficits/detail RLE Deficits / Details: Pt reported increased pain in R hip and stated that she could not lift it - assisted pt in moving RLE in bed within lateral hip precautions  RLE: Unable to fully assess due to pain       Communication   Communication: HOH;No difficulties  Cognition Arousal/Alertness: Awake/alert Behavior During Therapy: WFL for tasks assessed/performed Overall Cognitive Status: Within Functional Limits for tasks assessed                                        General Comments General comments (skin integrity, edema, etc.): Unable to assess sitting or standing balance this session but pt had h/o falls and required RW for mobility prior to admission.    Exercises Total Joint Exercises Ankle Circles/Pumps: Both;10 reps;Supine;Limitations Ankle Circles/Pumps Limitations: required cueing for proper technique Quad Sets: Right;10 reps;Supine;Limitations Quad Sets Limitations: required cueing for proper technique Gluteal Sets: Right;10 reps;Supine Heel Slides: Right;10 reps;Supine;AAROM;Limitations Heel Slides Limitations: Pt able to get approximately 25 deg hip flexion with AAROM.   Assessment/Plan     PT Assessment Patient needs continued PT services  PT Problem List Decreased strength;Decreased range of motion;Decreased activity tolerance;Decreased balance;Decreased mobility;Decreased safety awareness;Pain       PT Treatment Interventions DME instruction;Gait training;Therapeutic activities;Therapeutic exercise;Patient/family education    PT Goals (Current goals can be found in the Care Plan section)  Acute Rehab PT Goals Patient Stated Goal: to go home PT Goal Formulation: With patient Time For Goal Achievement: 05/05/16 Potential to Achieve Goals: Good    Frequency 7X/week   Barriers to discharge        Co-evaluation  End of Session   Activity Tolerance: Patient limited by pain Patient left: in bed;with call bell/phone within reach;with SCD's reapplied Nurse Communication: Mobility status PT Visit Diagnosis: Unsteadiness on feet (R26.81);History of falling (Z91.81);Muscle weakness (generalized) (M62.81);Difficulty in walking, not elsewhere classified (R26.2);Pain Pain - Right/Left: Right Pain - part of body: Hip    Time: 1117-3567 PT Time Calculation (min) (ACUTE ONLY): 56 min   Charges:   PT Evaluation $PT Eval Low Complexity: 1 Procedure PT Treatments $Therapeutic Exercise: 8-22 mins   PT G Codes:   PT G-Codes **NOT FOR INPATIENT CLASS** Functional Assessment Tool Used: AM-PAC 6 Clicks Basic Mobility;Clinical judgement Functional Limitation: Mobility: Walking and moving around Mobility: Walking and Moving Around Current Status (O1410): At least 60 percent but less than 80 percent impaired, limited or restricted Mobility: Walking and Moving Around Goal Status [phone]): At least 40 percent but less than 60 percent impaired, limited or restricted    Geraldine Solar PT, DPT

## 2016-04-30 NOTE — Progress Notes (Signed)
PROGRESS NOTE                                                                                                                                                                                                             Patient Demographics:    Joann Hunter, is a 83 y.o. female, DOB - 1932/03/03, OHY:[id number]  Admit date - 04/27/2016   Admitting Physician Orvan Falconer, MD  Outpatient Primary MD for the patient is [facility], MD  LOS - 2  Outpatient Specialists: Oncology Dr Penland>> Dr Irene Limbo  Chief Complaint  Patient presents with  . Fall    right hip pain       Brief Narrative  83 y.o. female with hx of ischemic CMP, s/p NSTEMI, hx of chronic back pain, uretheral cancer on chemotherapy every 3 weeks, hx of HTN, HLD, Hypothyrodism, type II DM with nephropathy, fell and sustained right hip fracture, Went for surgical repair by Dr. Aline Brochure on 4/27.   Subjective:    Joann Hunter today Reports she is feeling much better, her right hip pain is better, denies any dyspnea, chest pain, no nausea or vomiting .  Assessment  & Plan :    Principal Problem:   Hip fracture, unspecified laterality, closed, initial encounter [facility]) Active Problems:   Ischemic cardiomyopathy   Hyperlipidemia   Cancer ([place])   Hip fracture ([place])   Right hip fracture  - secondary to mechanical fall,  - Status post surgical repair by Dr. Kenton Kingfisher on 4/27, PT to see patient today, reports her pain is improving, continue with current pain regimen, she is on aspirin for DVT prophylaxis - Will encourage use incentive spirometry.  UTI - Patient with fever of 101, urinalysis was positive after Foley insertion, she was started on Rocephin, continue to follow on urine cultures, Foley was discontinued this a.m. Marland Kitchen  CKD stage IV - At baseline, continue to monitor closely. Will start on some Lasix,  Hypothyroidism - Continue Synthroid  Stage IV Urothelial carcinoma, multifocal R  ureteral masses with liver metastases - She is on chemotherapy, continue to follow with oncology as an outpatient  Type 2 diabetes - He doesn't appear to be in any home medication, will monitor closely  CAD.  - She had NSTEMI 04/2011 with 3 vessel CAD s/p PCI.  - She denies any chest pain or shortness  of breath, continue with aspirin - Appears with mild volume overload, will give 40 mg of IV Lasix today   Code Status : Full  Family Communication  : D/W daughter at bedside  Disposition Plan  : Pending PT evaluation after surgery, likely will need SNF  Consults  :  Orthopedic  Procedures  : ARTHROPLASTY BIPOLAR HIP (HEMIARTHROPLASTY) (Right) by Dr. Aline Brochure on 4/27  DVT Prophylaxis  :   aspirin - SCDs   Lab Results  Component Value Date   PLT 153 04/30/2016    Antibiotics  :   Anti-infectives    Start     Dose/Rate Route Frequency Ordered Stop   04/29/16 1011  clindamycin (CLEOCIN) IVPB 900 mg     900 mg 100 mL/hr over 30 Minutes Intravenous On call to O.R. 04/29/16 1011 04/29/16 1127   04/29/16 1000  cefTRIAXone (ROCEPHIN) injection 1 g  Status:  Discontinued     1 g Intramuscular Every 24 hours 04/29/16 0853 04/29/16 0905   04/29/16 1000  cefTRIAXone (ROCEPHIN) 1 g in dextrose 5 % 50 mL IVPB     1 g 100 mL/hr over 30 Minutes Intravenous Every 24 hours 04/29/16 0905          Objective:   Vitals:   04/29/16 1829 04/29/16 2100 04/30/16 0500 04/30/16 1043  BP: (!) 148/53 (!) 158/72 (!) 144/62   Pulse: (!) 102 (!) 110 71 72  Resp: 18 18 18    Temp: 98.4 F (36.9 C) 98.8 F (37.1 C) 98.6 F (37 C)   TempSrc: Oral Oral Oral   SpO2: 99% 93% 100% 96%  Weight:      Height:        Wt Readings from Last 3 Encounters:  04/28/16 74.3 kg (163 lb 12.8 oz)  04/14/16 73.2 kg (161 lb 4.8 oz)  03/24/16 71.2 kg (157 lb)     Intake/Output Summary (Last 24 hours) at 04/30/16 1057 Last data filed at 04/30/16 0900  Gross per 24 hour  Intake          2896.67 ml  Output               850 ml  Net          2046.67 ml     Physical Exam  Awake Alert, Oriented X 3, No differential bilaterally, some basal crackles,No wheezing  RRR,No Gallops,Rubs or new Murmurs, No Parasternal Heave +ve B.Sounds, Abd Soft, No tenderness,  No rebound - guarding or rigidity. Cyanosis, no bleed or wheezing can be seen through bandage over her right hip, bilateral lower extremity warm, with good capillary refills, mild pitting edema.    Data Review:    CBC  Recent Labs Lab 04/27/16 2252 04/28/16 0600 04/29/16 0607 04/30/16 0704  WBC 7.0 5.5 5.3 5.3  HGB 10.2* 9.1* 12.1 9.9*  HCT 30.2* 27.9* 36.9 30.1*  PLT 167 185 164 153  MCV 94.1 95.2 92.3 92.6  MCH 31.8 31.1 30.3 30.5  MCHC 33.8 32.6 32.8 32.9  RDW 13.4 13.5 15.5 15.0  LYMPHSABS 0.7  --   --   --   MONOABS 0.5  --   --   --   EOSABS 0.2  --   --   --   BASOSABS 0.0  --   --   --     Chemistries   Recent Labs Lab 04/27/16 2252 04/28/16 0600 04/29/16 0607 04/30/16 0704  NA 133* 136 134* 134*  K 3.7 4.1 3.9 3.8  CL 102 104 99* 100*  CO2 24 27 29 28   GLUCOSE 234* 165* 193* 135*  BUN 14 13 13 15   CREATININE 1.79* 1.65* 1.59* 1.44*  CALCIUM 8.5* 8.2* 8.3* 8.0*  AST  --  19  --   --   ALT  --  12*  --   --   ALKPHOS  --  96  --   --   BILITOT  --  0.5  --   --    ------------------------------------------------------------------------------------------------------------------ No results for input(s): CHOL, HDL, LDLCALC, TRIG, CHOLHDL, LDLDIRECT in the last 72 hours.  Lab Results  Component Value Date   HGBA1C 6.3 (H) 02/24/2016   ------------------------------------------------------------------------------------------------------------------  Recent Labs  04/27/16 2252  TSH 2.360   ------------------------------------------------------------------------------------------------------------------ No results for input(s): VITAMINB12, FOLATE, FERRITIN, TIBC, IRON, RETICCTPCT in the last 72  hours.  Coagulation profile No results for input(s): INR, PROTIME in the last 168 hours.  No results for input(s): DDIMER in the last 72 hours.  Cardiac Enzymes No results for input(s): CKMB, TROPONINI, MYOGLOBIN in the last 168 hours.  Invalid input(s): CK ------------------------------------------------------------------------------------------------------------------    Component Value Date/Time   BNP 155.0 (H) 07/26/2014 0557    Inpatient Medications  Scheduled Meds: . aspirin EC  325 mg Oral Q breakfast  . docusate sodium  100 mg Oral BID  . furosemide  40 mg Intravenous Once  . insulin aspart  0-9 Units Subcutaneous Q4H  . levothyroxine  112 mcg Oral q morning - 10a  . mouth rinse  15 mL Mouth Rinse BID  . metoprolol tartrate  25 mg Oral BID  . omega-3 acid ethyl esters  1 g Oral Daily  . pantoprazole  40 mg Oral Daily  . saccharomyces boulardii  250 mg Oral BID  . sodium chloride flush  3 mL Intravenous Q12H   Continuous Infusions: . cefTRIAXone (ROCEPHIN)  IV Stopped (04/29/16 1619)  . dextrose 5 % and 0.9% NaCl 50 mL/hr at 04/29/16 2157   PRN Meds:.alum & mag hydroxide-simeth, hydrALAZINE, HYDROcodone-acetaminophen, HYDROmorphone (DILAUDID) injection, menthol-cetylpyridinium **OR** phenol, metoCLOPramide **OR** metoCLOPramide (REGLAN) injection, ondansetron **OR** ondansetron (ZOFRAN) IV, senna-docusate, sorbitol, traMADol  Micro Results Recent Results (from the past 240 hour(s))  Surgical PCR screen     Status: None   Collection Time: 04/28/16  3:26 AM  Result Value Ref Range Status   MRSA, PCR NEGATIVE NEGATIVE Final   Staphylococcus aureus NEGATIVE NEGATIVE Final    Comment:        The Xpert SA Assay (FDA approved for NASAL specimens in patients over 54 years of age), is one component of a comprehensive surveillance program.  Test performance has been validated by [facility] for patients greater than or equal to 78 year old. It is not intended to  diagnose infection nor to guide or monitor treatment.     Radiology Reports Dg Chest 1 View  Result Date: 04/27/2016 CLINICAL DATA:  83 year old female with fall and right hip pain. EXAM: CHEST 1 VIEW COMPARISON:  Chest radiograph dated 02/24/2016 FINDINGS: There is mild cardiomegaly. Mild central and perihilar interstitial streaky density may represent mild vascular congestion. An atypical or viral pneumonia is not entirely excluded. Clinical correlation is recommended. There is no focal consolidation, pleural effusion, or pneumothorax. Coronary vascular calcification as well as atherosclerotic calcification of the aortic arch. There is osteopenia with degenerative changes of the spine. No acute fracture. IMPRESSION: Mild cardiomegaly with possible mild vascular congestion. No focal consolidation. Electronically Signed   By: Anner Crete  M.D.   On: 04/27/2016 22:38   Dg Elbow Complete Right  Result Date: 04/01/2016 CLINICAL DATA:  Patient fell today at home. States that she was not dizzy, but cannot remember what caused her to fall. Family states that she was taken off of her blood pressure meds because they were causing hypotension. States that she has fallen twice this week and was seen by her physician on Tuesday for the fall. She had hit her head and had multiple bruises on her hands and arms. EXAM: RIGHT ELBOW - COMPLETE 3+ VIEW COMPARISON:  None. FINDINGS: No fracture. The elbow joint is normally spaced and aligned. No significant arthropathic change. No joint effusion. There is mild posterior subcutaneous soft tissue edema. IMPRESSION: No fracture or dislocation. Electronically Signed   By: Lajean Manes M.D.   On: 04/01/2016 20:41   Dg Forearm Right  Result Date: 04/01/2016 CLINICAL DATA:  Patient fell today at home. States that she was not dizzy, but cannot remember what caused her to fall. Family states that she was taken off of her blood pressure meds because they were causing  hypotension. States that she has fallen twice this week and was seen by her physician on Tuesday for the fall. She had hit her head and had multiple bruises on her hands and arms. EXAM: RIGHT FOREARM - 2 VIEW COMPARISON:  None. FINDINGS: No fracture.  No bone lesion.  The bones are demineralized. The elbow and wrist joints are normally aligned. The soft tissues are unremarkable. IMPRESSION: No fracture, bone lesion or dislocation. Electronically Signed   By: Lajean Manes M.D.   On: 04/01/2016 20:41   Ct Head Wo Contrast  Result Date: 04/27/2016 CLINICAL DATA:  83 year old female with fall. EXAM: CT HEAD WITHOUT CONTRAST CT CERVICAL SPINE WITHOUT CONTRAST TECHNIQUE: Multidetector CT imaging of the head and cervical spine was performed following the standard protocol without intravenous contrast. Multiplanar CT image reconstructions of the cervical spine were also generated. COMPARISON:  Head CT dated 02/24/2016 FINDINGS: CT HEAD FINDINGS Brain: There is moderate age-related atrophy and chronic microvascular ischemic changes is no acute intracranial hemorrhage. No mass effect or midline shift noted. No intra-axial fluid collection. Vascular: No hyperdense vessel or unexpected calcification. Skull: Normal. Negative for fracture or focal lesion. Sinuses/Orbits: No acute finding. Other: None CT CERVICAL SPINE FINDINGS Alignment: No acute subluxation. New there is straightening of the normal cervical lordosis which may be positional or due to muscle spasm or secondary to degenerative changes. Skull base and vertebrae: No acute fracture. Incomplete bony fusion of the posterior ring of C1. The bones are osteopenic. Soft tissues and spinal canal: No prevertebral fluid or swelling. No visible canal hematoma. Disc levels: Multilevel degenerative changes with endplate irregularity and disc space narrowing most prominent involving C5-C6 and C6-C7. There is anterior osteophyte at C4-C5 and C5-C6. Multilevel facet hypertrophy  with associated neural foramina narrowing most prominent at C3-C4 on the left. Upper chest: Bilateral carotid bulb atherosclerotic plaques. The visualized lung bases are clear. Other: None IMPRESSION: 1. No acute intracranial hemorrhage. Age-related atrophy and chronic microvascular ischemic changes. 2. No acute/traumatic cervical spine pathology. Multilevel degenerative changes. Electronically Signed   By: Anner Crete M.D.   On: 04/27/2016 22:47   Ct Head Wo Contrast  Result Date: 04/18/2016 CLINICAL DATA:  New headaches, dizziness and falls for 3 weeks. History of stage IV urothelial carcinoma. EXAM: CT HEAD WITHOUT CONTRAST TECHNIQUE: Contiguous axial images were obtained from the base of the skull through the vertex  without intravenous contrast. COMPARISON:  CT HEAD February 21st 1,018 FINDINGS: BRAIN: No intraparenchymal hemorrhage, mass effect nor midline shift. The ventricles and sulci are normal for age. Patchy supratentorial white matter hypodensities within normal range for patient's age, though non-specific are most compatible with chronic small vessel ischemic disease. Old LEFT pontine lacunar infarct. Old LEFT cerebellar small infarcts. No acute large vascular territory infarcts. No abnormal extra-axial fluid collections. Basal cisterns are patent. VASCULAR: Moderate calcific atherosclerosis of the carotid siphons. SKULL: No skull fracture. Moderate RIGHT temporomandibular osteoarthrosis. Osteopenia. No significant scalp soft tissue swelling. SINUSES/ORBITS: Trace paranasal sinus mucosal thickening. Mastoid air cells are well aerated. The included ocular globes and orbital contents are non-suspicious. OTHER: Patient is edentulous. IMPRESSION: No acute intracranial process. Stable examination including moderate chronic small vessel ischemic disease and old small LEFT cerebellar infarcts. Electronically Signed   By: Elon Alas M.D.   On: 04/18/2016 22:23   Ct Cervical Spine Wo  Contrast  Result Date: 04/27/2016 CLINICAL DATA:  83 year old female with fall. EXAM: CT HEAD WITHOUT CONTRAST CT CERVICAL SPINE WITHOUT CONTRAST TECHNIQUE: Multidetector CT imaging of the head and cervical spine was performed following the standard protocol without intravenous contrast. Multiplanar CT image reconstructions of the cervical spine were also generated. COMPARISON:  Head CT dated 02/24/2016 FINDINGS: CT HEAD FINDINGS Brain: There is moderate age-related atrophy and chronic microvascular ischemic changes is no acute intracranial hemorrhage. No mass effect or midline shift noted. No intra-axial fluid collection. Vascular: No hyperdense vessel or unexpected calcification. Skull: Normal. Negative for fracture or focal lesion. Sinuses/Orbits: No acute finding. Other: None CT CERVICAL SPINE FINDINGS Alignment: No acute subluxation. New there is straightening of the normal cervical lordosis which may be positional or due to muscle spasm or secondary to degenerative changes. Skull base and vertebrae: No acute fracture. Incomplete bony fusion of the posterior ring of C1. The bones are osteopenic. Soft tissues and spinal canal: No prevertebral fluid or swelling. No visible canal hematoma. Disc levels: Multilevel degenerative changes with endplate irregularity and disc space narrowing most prominent involving C5-C6 and C6-C7. There is anterior osteophyte at C4-C5 and C5-C6. Multilevel facet hypertrophy with associated neural foramina narrowing most prominent at C3-C4 on the left. Upper chest: Bilateral carotid bulb atherosclerotic plaques. The visualized lung bases are clear. Other: None IMPRESSION: 1. No acute intracranial hemorrhage. Age-related atrophy and chronic microvascular ischemic changes. 2. No acute/traumatic cervical spine pathology. Multilevel degenerative changes. Electronically Signed   By: Anner Crete M.D.   On: 04/27/2016 22:47   Dg Pelvis Portable  Result Date: 04/29/2016 CLINICAL  DATA:  Postop for right hip replacement. EXAM: PORTABLE PELVIS 1-2 VIEWS COMPARISON:  CT of 10/28/2015 FINDINGS: AP view of the pelvis demonstrates right hip arthroplasty. No periprosthetic fracture or acute abnormality identified. Advanced left hip osteoarthritis. Degenerative disc disease at the lumbosacral junction. IMPRESSION: Expected appearance after right hip arthroplasty. Advanced left hip osteoarthritis. Electronically Signed   By: Abigail Miyamoto M.D.   On: 04/29/2016 13:40   Dg Hip Unilat  With Pelvis 2-3 Views Right  Result Date: 04/27/2016 CLINICAL DATA:  83 year old female with fall and right hip pain. EXAM: DG HIP (WITH OR WITHOUT PELVIS) 2-3V RIGHT COMPARISON:  Radiograph dated 11/05/2014 FINDINGS: There is a minimally displaced fracture of the base of the right femoral neck with foreshortened appearance of the femoral neck. The femoral head remains in anatomic alignment with the acetabulum. No other acute fracture identified. The bones are osteopenic. There is advanced osteoarthritic changes of the  left hip. Soft tissues appear unremarkable. IMPRESSION: Minimally displaced basicervical fracture of the right femoral neck. No dislocation. Advanced osteoarthritic changes of the left hip. Electronically Signed   By: Anner Crete M.D.   On: 04/27/2016 22:34     Vinetta Brach M.D on 04/30/2016 at 10:57 AM  Between 7am to 7pm - Pager - [phone]  After 7pm go to [email or web address] - password [facility]  [facility] -  Office  [phone]

## 2016-04-30 NOTE — Addendum Note (Signed)
Addendum  created 04/30/16 1548 by Mickel Baas, CRNA   Sign clinical note

## 2016-05-01 DIAGNOSIS — R410 Disorientation, unspecified: Secondary | ICD-10-CM

## 2016-05-01 LAB — URINE CULTURE: Culture: <10000 — AB

## 2016-05-01 LAB — TYPE AND SCREEN
ABO/RH(D): O NEG
Antibody Screen: NEGATIVE
UNIT DIVISION: 0
UNIT DIVISION: 0
Unit division: 0

## 2016-05-01 LAB — BASIC METABOLIC PANEL
Anion gap: 7 (ref 5–15)
BUN: 18 mg/dL (ref 6–20)
CALCIUM: 8 mg/dL — AB (ref 8.9–10.3)
CHLORIDE: 98 mmol/L — AB (ref 101–111)
CO2: 27 mmol/L (ref 22–32)
Creatinine, Ser: 1.42 mg/dL — ABNORMAL HIGH (ref 0.44–1.00)
GFR calc non Af Amer: 33 mL/min — ABNORMAL LOW (ref >60)
GFR, EST AFRICAN AMERICAN: 39 mL/min — AB (ref >60)
Glucose, Bld: 146 mg/dL — ABNORMAL HIGH (ref 65–99)
Potassium: 3.6 mmol/L (ref 3.5–5.1)
Sodium: 132 mmol/L — ABNORMAL LOW (ref 135–145)

## 2016-05-01 LAB — GLUCOSE, CAPILLARY
GLUCOSE-CAPILLARY: 111 mg/dL — AB (ref 65–99)
Glucose-Capillary: 128 mg/dL — ABNORMAL HIGH (ref 65–99)
Glucose-Capillary: 206 mg/dL — ABNORMAL HIGH (ref 65–99)
Glucose-Capillary: 173 mg/dL — ABNORMAL HIGH (ref 65–99)

## 2016-05-01 LAB — BPAM RBC
BLOOD PRODUCT EXPIRATION DATE: 201805102359
BLOOD PRODUCT EXPIRATION DATE: 201805202359
Blood Product Expiration Date: 201805202359
ISSUE DATE / TIME: 201804261213
UNIT TYPE AND RH: 9500
UNIT TYPE AND RH: 9500
Unit Type and Rh: 9500

## 2016-05-01 LAB — CBC
HEMATOCRIT: 30.6 % — AB (ref 36.0–46.0)
HEMOGLOBIN: 10.2 g/dL — AB (ref 12.0–15.0)
MCH: 30.6 pg (ref 26.0–34.0)
MCHC: 33.3 g/dL (ref 30.0–36.0)
MCV: 91.9 fL (ref 78.0–100.0)
Platelets: 154 10*3/uL (ref 150–400)
RBC: 3.33 MIL/uL — ABNORMAL LOW (ref 3.87–5.11)
RDW: 14.7 % (ref 11.5–15.5)
WBC: 5.5 10*3/uL (ref 4.0–10.5)

## 2016-05-01 MED ORDER — LORAZEPAM 2 MG/ML IJ SOLN
0.5000 mg | INTRAMUSCULAR | Status: DC | PRN
Start: 2016-05-01 — End: 2016-05-02
  Administered 2016-05-01 (×2): 0.5 mg via INTRAVENOUS
  Filled 2016-05-01 (×4): qty 1

## 2016-05-01 MED ORDER — ENSURE ENLIVE PO LIQD
237.0000 mL | Freq: Two times a day (BID) | ORAL | Status: DC
  Administered 2016-05-01 – 2016-05-02 (×3): 237 mL via ORAL

## 2016-05-01 NOTE — Progress Notes (Signed)
Physical Therapy Treatment Patient Details Name: Joann Hunter MRN: [id number] DOB: [birth year] Today's Date: 05/01/2016    History of Present Illness Joann Hunter is an 83 y.o. female with hx of ischemic CMP, s/p NSTEMI, hx of chronic back pain, uretheral cancer on chemotherapy every 3 weeks, hx of HTN, HLD, Hypothyrodism, type II DM with nephropathy, fell and hurt her right hip.  X ray showed right hip Fx.  CXR showed possible mild vascular congestion.  Serology showed Cr of 1.8, BS of 234, and Hb of 10 g per dL.  EKG showed NSR with no acute ST T changes. Head CT and cervical CT showed no acute pathology.  Dr Aline Brochure was consulted by EDP and hospitalist was asked to admit her for surgical repair.   She has no CP/SOB, but was hypoxic with Sat to 86 percent, corrected to 94% on 3 L [place].    PT Comments    Pt received lying in bed with dtr at bedside and was agreeable to PT treatment. Pt reported no R hip pain at beginning of session. Began with bed exercises which she tolerated well this date. Pt required max A for supine <> sit this date. She c/o dizziness once sitting EOB but did not have any LOB or demo unsteadiness while seated with feet supported. Pt able to perform 5 sit <> stand transfers with RW with min guard to min A throughout. Attempted side stepping towards HOB but pt unable to perform due to increased R hip pain. She was able to take two very tiny steps with her LLE but was unable to advance her RLE due to weakness and pain. Pt able to perform lateral scooting in sitting with min guard. Pt max A for sit > supine and was left lying in bed with dtr at bedside and call bell within reach. Pt's nurse notified of her mobility this session. Continue to recommend SNF upon d/c due to pt's current mobility level.   Follow Up Recommendations  SNF     Equipment Recommendations  None recommended by PT    Recommendations for Other Services       Precautions / Restrictions  Precautions Precautions: Fall Precaution Comments: Lateral hip precautions    Mobility  Bed Mobility Overal bed mobility: Needs Assistance Bed Mobility: Supine to Sit;Sit to Supine     Supine to sit: Max assist Sit to supine: Max assist   General bed mobility comments: Max A for supine <> sit for RLE and upper body negotiation.   Transfers Overall transfer level: Needs assistance Equipment used: Rolling walker (2 wheeled) Transfers: Sit to/from Stand;Lateral/Scoot Transfers Sit to Stand: Min assist;Min guard        Lateral/Scoot Transfers: Min guard General transfer comment: Pt required min guard to min A with sit <> stands with RW x 5 reps.She required vc's for proper hand placement and to attain full upright position once in standing. Pt min guard for lateral scooting at EOB towards HOB.  Ambulation/Gait             General Gait Details: attempted side stepping at EOB with RW but pt only able to advance LLE 2 very tiny steps and she could not advance RLE due to pain. Had pt perfom lateral scooting while sitting EOB which required min guard.   Stairs            Wheelchair Mobility    Modified Rankin (Stroke Patients Only)       Balance Overall balance  assessment: Needs assistance Sitting-balance support: Feet supported Sitting balance-Leahy Scale: Good Sitting balance - Comments: pt c/o dizziness once sitting EOB but she was sitting independently with no evidence of posterior or lateral trunk leans.   Standing balance support: Bilateral upper extremity supported Standing balance-Leahy Scale: Fair Standing balance comment: with RW                            Cognition                                              Exercises Total Joint Exercises Ankle Circles/Pumps: Both;20 reps;Supine Heel Slides: AAROM;Right;20 reps Heel Slides Limitations: Pt able to get approximately 45-50 deg hip flexion with AAROM; improved  tolerance to heel slides this session.    General Comments        Pertinent Vitals/Pain Pain Assessment: Faces (pt denied pain at beginning of session but during session pt expressed increased pain in R hip) Faces Pain Scale: Hurts even more Pain Location: R hip Pain Descriptors / Indicators: Aching Pain Intervention(s): Limited activity within patient's tolerance;Monitored during session;Repositioned    Home Living                      Prior Function            PT Goals (current goals can now be found in the care plan section) Acute Rehab PT Goals Patient Stated Goal: to go home PT Goal Formulation: With patient Time For Goal Achievement: 05/05/16 Potential to Achieve Goals: Good    Frequency    7X/week      PT Plan      Co-evaluation             End of Session Equipment Utilized During Treatment: Gait belt Activity Tolerance: Patient limited by pain;Patient tolerated treatment well Patient left: in bed;with call bell/phone within reach;with SCD's reapplied;with family/visitor present Nurse Communication: Mobility status (updated mobility sheet in room and spoke with her RN, Ander Purpura) PT Visit Diagnosis: Unsteadiness on feet (R26.81);History of falling (Z91.81);Muscle weakness (generalized) (M62.81);Difficulty in walking, not elsewhere classified (R26.2);Pain Pain - Right/Left: Right Pain - part of body: Hip     Time: 1218-1252 PT Time Calculation (min) (ACUTE ONLY): 34 min  Charges:  $Therapeutic Activity: 23-37 mins                    G Codes:        Geraldine Solar PT, DPT

## 2016-05-01 NOTE — Progress Notes (Signed)
Patient needed a Air cabin crew for the night. Joann Hunter, got a nurse tech to cover for the night shift; however, patient's daughter has changed her mind and told us that she will stay until 10 PM tonight and that her friend will come and sit with the patient all night.

## 2016-05-01 NOTE — Progress Notes (Signed)
PT Cancellation Note  Patient Details Name: Joann Hunter MRN: [id number] DOB: [birth year]   Cancelled Treatment:    Reason Eval/Treat Not Completed: Fatigue/lethargy limiting ability to participate;Other (comment) Pt asleep in room and was very groggy. Per pt's dtr, pt was up all night and did not fall asleep until 5:30AM. Asked that PT check back later once pt is more awake. Will check back later today.   Geraldine Solar PT, DPT

## 2016-05-01 NOTE — Progress Notes (Signed)
PROGRESS NOTE                                                                                                                                                                                                             Patient Demographics:    Joann Hunter, is a 83 y.o. female, DOB - 1932-07-21, GYI:[id number]  Admit date - 04/27/2016   Admitting Physician Orvan Falconer, MD  Outpatient Primary MD for the patient is [facility], MD  LOS - 3  Outpatient Specialists: Oncology Dr Penland>> Dr Irene Limbo  Chief Complaint  Patient presents with  . Fall    right hip pain       Brief Narrative  83 y.o. female with hx of ischemic CMP, s/p NSTEMI, hx of chronic back pain, uretheral cancer on chemotherapy every 3 weeks, hx of HTN, HLD, Hypothyrodism, type II DM with nephropathy, fell and sustained right hip fracture, Went for surgical repair by Dr. Aline Brochure on 4/27.   Subjective:    Joann Hunter today Reports pain is controlled, she had poor sleep overnight, had some confusion as well, no fever.  Assessment  & Plan :    Principal Problem:   Hip fracture, unspecified laterality, closed, initial encounter [facility]) Active Problems:   Ischemic cardiomyopathy   Hyperlipidemia   Cancer ([place])   Hip fracture ([place])   Right hip fracture  - secondary to mechanical fall,  - Status post surgical repair by Dr. Kenton Kingfisher on 4/27, PT to see patient today, reports her pain is improving, continue with current pain regimen, she is on aspirin for DVT prophylaxis - Will encourage use incentive spirometry.  UTI - Patient with fever of 101, urinalysis was positive after Foley insertion, she is on  Rocephin, Foley was discontinued .  CKD stage IV - At baseline, continue to monitor closely. Received some when necessary Lasix, creatinine is stable  Hypothyroidism - Continue Synthroid  Stage IV Urothelial carcinoma, multifocal R ureteral masses with liver metastases - She is on  chemotherapy, continue to follow with oncology as an outpatient  Type 2 diabetes - He doesn't appear to be in any home medication, CBGs are acceptable   CAD.  - She had NSTEMI 04/2011 with 3 vessel CAD s/p PCI.  - She denies any chest pain or shortness of breath, continue with aspirin - No further evidence of  volume overload, no need for Lasix today.  Delirium - She had some overnight, will decrease sedating medication, DC Dilaudid, continue with Vicodin and tramadol for pain continue with frequent reorientation  Code Status : Full  Family Communication  : D/W daughter at bedside  Disposition Plan  : SNF in am  Consults  :  Orthopedic  Procedures  : ARTHROPLASTY BIPOLAR HIP (HEMIARTHROPLASTY) (Right) by Dr. Aline Brochure on 4/27  DVT Prophylaxis  :   aspirin - SCDs   Lab Results  Component Value Date   PLT 154 05/01/2016    Antibiotics  :   Anti-infectives    Start     Dose/Rate Route Frequency Ordered Stop   04/29/16 1011  clindamycin (CLEOCIN) IVPB 900 mg     900 mg 100 mL/hr over 30 Minutes Intravenous On call to O.R. 04/29/16 1011 04/29/16 1127   04/29/16 1000  cefTRIAXone (ROCEPHIN) injection 1 g  Status:  Discontinued     1 g Intramuscular Every 24 hours 04/29/16 0853 04/29/16 0905   04/29/16 1000  cefTRIAXone (ROCEPHIN) 1 g in dextrose 5 % 50 mL IVPB     1 g 100 mL/hr over 30 Minutes Intravenous Every 24 hours 04/29/16 0905          Objective:   Vitals:   04/30/16 1344 04/30/16 1400 04/30/16 2105 05/01/16 0105  BP:  (!) 150/93 (!) 190/53 (!) 130/44  Pulse: 73  76 69  Resp:   18 18  Temp: 98.4 F (36.9 C)  98.7 F (37.1 C) 98.7 F (37.1 C)  TempSrc: Oral  Oral Oral  SpO2: 99%  94% 93%  Weight:      Height:        Wt Readings from Last 3 Encounters:  04/28/16 74.3 kg (163 lb 12.8 oz)  04/14/16 73.2 kg (161 lb 4.8 oz)  03/24/16 71.2 kg (157 lb)     Intake/Output Summary (Last 24 hours) at 05/01/16 1048 Last data filed at 05/01/16 0900  Gross per 24  hour  Intake              533 ml  Output                0 ml  Net              533 ml     Physical Exam  Sleeping comfortably in bed, no apparent distress No differential bilaterally, clear to auscultation bilaterally, no wheezing  RRR,No Gallops,Rubs or new Murmurs, No Parasternal Heave +ve B.Sounds, Abd Soft, No tenderness,  No rebound - guarding or rigidity. Cyanosis, no edema bilaterally, no oozing or discharge through bandage from wound, good capillary refill, .   Data Review:    CBC  Recent Labs Lab 04/27/16 2252 04/28/16 0600 04/29/16 0607 04/30/16 0704 05/01/16 0623  WBC 7.0 5.5 5.3 5.3 5.5  HGB 10.2* 9.1* 12.1 9.9* 10.2*  HCT 30.2* 27.9* 36.9 30.1* 30.6*  PLT 167 185 164 153 154  MCV 94.1 95.2 92.3 92.6 91.9  MCH 31.8 31.1 30.3 30.5 30.6  MCHC 33.8 32.6 32.8 32.9 33.3  RDW 13.4 13.5 15.5 15.0 14.7  LYMPHSABS 0.7  --   --   --   --   MONOABS 0.5  --   --   --   --   EOSABS 0.2  --   --   --   --   BASOSABS 0.0  --   --   --   --  Chemistries   Recent Labs Lab 04/27/16 2252 04/28/16 0600 04/29/16 0607 04/30/16 0704 05/01/16 0623  NA 133* 136 134* 134* 132*  K 3.7 4.1 3.9 3.8 3.6  CL 102 104 99* 100* 98*  CO2 24 27 29 28 27   GLUCOSE 234* 165* 193* 135* 146*  BUN 14 13 13 15 18   CREATININE 1.79* 1.65* 1.59* 1.44* 1.42*  CALCIUM 8.5* 8.2* 8.3* 8.0* 8.0*  AST  --  19  --   --   --   ALT  --  12*  --   --   --   ALKPHOS  --  96  --   --   --   BILITOT  --  0.5  --   --   --    ------------------------------------------------------------------------------------------------------------------ No results for input(s): CHOL, HDL, LDLCALC, TRIG, CHOLHDL, LDLDIRECT in the last 72 hours.  Lab Results  Component Value Date   HGBA1C 6.3 (H) 02/24/2016   ------------------------------------------------------------------------------------------------------------------ No results for input(s): TSH, T4TOTAL, T3FREE, THYROIDAB in the last 72  hours.  Invalid input(s): FREET3 ------------------------------------------------------------------------------------------------------------------ No results for input(s): VITAMINB12, FOLATE, FERRITIN, TIBC, IRON, RETICCTPCT in the last 72 hours.  Coagulation profile No results for input(s): INR, PROTIME in the last 168 hours.  No results for input(s): DDIMER in the last 72 hours.  Cardiac Enzymes No results for input(s): CKMB, TROPONINI, MYOGLOBIN in the last 168 hours.  Invalid input(s): CK ------------------------------------------------------------------------------------------------------------------    Component Value Date/Time   BNP 155.0 (H) 07/26/2014 0557    Inpatient Medications  Scheduled Meds: . aspirin EC  325 mg Oral Q breakfast  . docusate sodium  100 mg Oral BID  . levothyroxine  112 mcg Oral q morning - 10a  . mouth rinse  15 mL Mouth Rinse BID  . metoprolol tartrate  25 mg Oral BID  . omega-3 acid ethyl esters  1 g Oral Daily  . pantoprazole  40 mg Oral Daily  . saccharomyces boulardii  250 mg Oral BID  . sodium chloride flush  3 mL Intravenous Q12H   Continuous Infusions: . cefTRIAXone (ROCEPHIN)  IV Stopped (04/30/16 1145)   PRN Meds:.alum & mag hydroxide-simeth, hydrALAZINE, HYDROcodone-acetaminophen, HYDROmorphone (DILAUDID) injection, LORazepam, menthol-cetylpyridinium **OR** phenol, metoCLOPramide **OR** metoCLOPramide (REGLAN) injection, ondansetron **OR** ondansetron (ZOFRAN) IV, senna-docusate, sorbitol, traMADol  Micro Results Recent Results (from the past 240 hour(s))  Surgical PCR screen     Status: None   Collection Time: 04/28/16  3:26 AM  Result Value Ref Range Status   MRSA, PCR NEGATIVE NEGATIVE Final   Staphylococcus aureus NEGATIVE NEGATIVE Final    Comment:        The Xpert SA Assay (FDA approved for NASAL specimens in patients over 10 years of age), is one component of a comprehensive surveillance program.  Test performance  has been validated by [facility] for patients greater than or equal to 87 year old. It is not intended to diagnose infection nor to guide or monitor treatment.   Culture, Urine     Status: Abnormal   Collection Time: 04/29/16  7:17 AM  Result Value Ref Range Status   Specimen Description URINE, CLEAN CATCH  Final   Special Requests NONE  Final   Culture (A)  Final    <10,000 COLONIES/mL INSIGNIFICANT GROWTH Performed at [facility], [address] [place], [place] [address]    Report Status 05/01/2016 FINAL  Final    Radiology Reports Dg Chest 1 View  Result Date: 04/27/2016 CLINICAL DATA:  83 year old female with fall and right hip pain. EXAM: CHEST 1 VIEW COMPARISON:  Chest radiograph dated 02/24/2016 FINDINGS: There is mild cardiomegaly. Mild central and perihilar interstitial streaky density may represent mild vascular congestion. An atypical or viral pneumonia is not entirely excluded. Clinical correlation is recommended. There is no focal consolidation, pleural effusion, or pneumothorax. Coronary vascular calcification as well as atherosclerotic calcification of the aortic arch. There is osteopenia with degenerative changes of the spine. No acute fracture. IMPRESSION: Mild cardiomegaly with possible mild vascular congestion. No focal consolidation. Electronically Signed   By: Anner Crete M.D.   On: 04/27/2016 22:38   Dg Elbow Complete Right  Result Date: 04/01/2016 CLINICAL DATA:  Patient fell today at home. States that she was not dizzy, but cannot remember what caused her to fall. Family states that she was taken off of her blood pressure meds because they were causing hypotension. States that she has fallen twice this week and was seen by her physician on Tuesday for the fall. She had hit her head and had multiple bruises on her hands and arms. EXAM: RIGHT ELBOW - COMPLETE 3+ VIEW COMPARISON:  None. FINDINGS: No fracture. The elbow joint is normally spaced and  aligned. No significant arthropathic change. No joint effusion. There is mild posterior subcutaneous soft tissue edema. IMPRESSION: No fracture or dislocation. Electronically Signed   By: Lajean Manes M.D.   On: 04/01/2016 20:41   Dg Forearm Right  Result Date: 04/01/2016 CLINICAL DATA:  Patient fell today at home. States that she was not dizzy, but cannot remember what caused her to fall. Family states that she was taken off of her blood pressure meds because they were causing hypotension. States that she has fallen twice this week and was seen by her physician on Tuesday for the fall. She had hit her head and had multiple bruises on her hands and arms. EXAM: RIGHT FOREARM - 2 VIEW COMPARISON:  None. FINDINGS: No fracture.  No bone lesion.  The bones are demineralized. The elbow and wrist joints are normally aligned. The soft tissues are unremarkable. IMPRESSION: No fracture, bone lesion or dislocation. Electronically Signed   By: Lajean Manes M.D.   On: 04/01/2016 20:41   Ct Head Wo Contrast  Result Date: 04/27/2016 CLINICAL DATA:  83 year old female with fall. EXAM: CT HEAD WITHOUT CONTRAST CT CERVICAL SPINE WITHOUT CONTRAST TECHNIQUE: Multidetector CT imaging of the head and cervical spine was performed following the standard protocol without intravenous contrast. Multiplanar CT image reconstructions of the cervical spine were also generated. COMPARISON:  Head CT dated 02/24/2016 FINDINGS: CT HEAD FINDINGS Brain: There is moderate age-related atrophy and chronic microvascular ischemic changes is no acute intracranial hemorrhage. No mass effect or midline shift noted. No intra-axial fluid collection. Vascular: No hyperdense vessel or unexpected calcification. Skull: Normal. Negative for fracture or focal lesion. Sinuses/Orbits: No acute finding. Other: None CT CERVICAL SPINE FINDINGS Alignment: No acute subluxation. New there is straightening of the normal cervical lordosis which may be positional or  due to muscle spasm or secondary to degenerative changes. Skull base and vertebrae: No acute fracture. Incomplete bony fusion of the posterior ring of C1. The bones are osteopenic. Soft tissues and spinal canal: No prevertebral fluid or swelling. No visible canal hematoma. Disc levels: Multilevel degenerative changes with endplate irregularity and disc space narrowing most prominent involving C5-C6 and C6-C7. There is anterior osteophyte at C4-C5 and C5-C6. Multilevel facet hypertrophy with associated neural foramina narrowing most prominent at C3-C4 on  the left. Upper chest: Bilateral carotid bulb atherosclerotic plaques. The visualized lung bases are clear. Other: None IMPRESSION: 1. No acute intracranial hemorrhage. Age-related atrophy and chronic microvascular ischemic changes. 2. No acute/traumatic cervical spine pathology. Multilevel degenerative changes. Electronically Signed   By: Anner Crete M.D.   On: 04/27/2016 22:47   Ct Head Wo Contrast  Result Date: 04/18/2016 CLINICAL DATA:  New headaches, dizziness and falls for 3 weeks. History of stage IV urothelial carcinoma. EXAM: CT HEAD WITHOUT CONTRAST TECHNIQUE: Contiguous axial images were obtained from the base of the skull through the vertex without intravenous contrast. COMPARISON:  CT HEAD February 21st 1,018 FINDINGS: BRAIN: No intraparenchymal hemorrhage, mass effect nor midline shift. The ventricles and sulci are normal for age. Patchy supratentorial white matter hypodensities within normal range for patient's age, though non-specific are most compatible with chronic small vessel ischemic disease. Old LEFT pontine lacunar infarct. Old LEFT cerebellar small infarcts. No acute large vascular territory infarcts. No abnormal extra-axial fluid collections. Basal cisterns are patent. VASCULAR: Moderate calcific atherosclerosis of the carotid siphons. SKULL: No skull fracture. Moderate RIGHT temporomandibular osteoarthrosis. Osteopenia. No  significant scalp soft tissue swelling. SINUSES/ORBITS: Trace paranasal sinus mucosal thickening. Mastoid air cells are well aerated. The included ocular globes and orbital contents are non-suspicious. OTHER: Patient is edentulous. IMPRESSION: No acute intracranial process. Stable examination including moderate chronic small vessel ischemic disease and old small LEFT cerebellar infarcts. Electronically Signed   By: Elon Alas M.D.   On: 04/18/2016 22:23   Ct Cervical Spine Wo Contrast  Result Date: 04/27/2016 CLINICAL DATA:  83 year old female with fall. EXAM: CT HEAD WITHOUT CONTRAST CT CERVICAL SPINE WITHOUT CONTRAST TECHNIQUE: Multidetector CT imaging of the head and cervical spine was performed following the standard protocol without intravenous contrast. Multiplanar CT image reconstructions of the cervical spine were also generated. COMPARISON:  Head CT dated 02/24/2016 FINDINGS: CT HEAD FINDINGS Brain: There is moderate age-related atrophy and chronic microvascular ischemic changes is no acute intracranial hemorrhage. No mass effect or midline shift noted. No intra-axial fluid collection. Vascular: No hyperdense vessel or unexpected calcification. Skull: Normal. Negative for fracture or focal lesion. Sinuses/Orbits: No acute finding. Other: None CT CERVICAL SPINE FINDINGS Alignment: No acute subluxation. New there is straightening of the normal cervical lordosis which may be positional or due to muscle spasm or secondary to degenerative changes. Skull base and vertebrae: No acute fracture. Incomplete bony fusion of the posterior ring of C1. The bones are osteopenic. Soft tissues and spinal canal: No prevertebral fluid or swelling. No visible canal hematoma. Disc levels: Multilevel degenerative changes with endplate irregularity and disc space narrowing most prominent involving C5-C6 and C6-C7. There is anterior osteophyte at C4-C5 and C5-C6. Multilevel facet hypertrophy with associated neural  foramina narrowing most prominent at C3-C4 on the left. Upper chest: Bilateral carotid bulb atherosclerotic plaques. The visualized lung bases are clear. Other: None IMPRESSION: 1. No acute intracranial hemorrhage. Age-related atrophy and chronic microvascular ischemic changes. 2. No acute/traumatic cervical spine pathology. Multilevel degenerative changes. Electronically Signed   By: Anner Crete M.D.   On: 04/27/2016 22:47   Dg Pelvis Portable  Result Date: 04/29/2016 CLINICAL DATA:  Postop for right hip replacement. EXAM: PORTABLE PELVIS 1-2 VIEWS COMPARISON:  CT of 10/28/2015 FINDINGS: AP view of the pelvis demonstrates right hip arthroplasty. No periprosthetic fracture or acute abnormality identified. Advanced left hip osteoarthritis. Degenerative disc disease at the lumbosacral junction. IMPRESSION: Expected appearance after right hip arthroplasty. Advanced left hip osteoarthritis. Electronically Signed  By: Abigail Miyamoto M.D.   On: 04/29/2016 13:40   Dg Hip Unilat  With Pelvis 2-3 Views Right  Result Date: 04/27/2016 CLINICAL DATA:  83 year old female with fall and right hip pain. EXAM: DG HIP (WITH OR WITHOUT PELVIS) 2-3V RIGHT COMPARISON:  Radiograph dated 11/05/2014 FINDINGS: There is a minimally displaced fracture of the base of the right femoral neck with foreshortened appearance of the femoral neck. The femoral head remains in anatomic alignment with the acetabulum. No other acute fracture identified. The bones are osteopenic. There is advanced osteoarthritic changes of the left hip. Soft tissues appear unremarkable. IMPRESSION: Minimally displaced basicervical fracture of the right femoral neck. No dislocation. Advanced osteoarthritic changes of the left hip. Electronically Signed   By: Anner Crete M.D.   On: 04/27/2016 22:34     ELGERGAWY, DAWOOD M.D on 05/01/2016 at 10:48 AM  Between 7am to 7pm - Pager - [phone]  After 7pm go to [email or web address] - password [facility]  [facility] -  Office  [phone]

## 2016-05-02 ENCOUNTER — Encounter (HOSPITAL_COMMUNITY): Payer: Self-pay | Admitting: Orthopedic Surgery

## 2016-05-02 LAB — GLUCOSE, CAPILLARY
Glucose-Capillary: 112 mg/dL — ABNORMAL HIGH (ref 65–99)
Glucose-Capillary: 130 mg/dL — ABNORMAL HIGH (ref 65–99)

## 2016-05-02 MED ORDER — METOPROLOL TARTRATE 25 MG PO TABS: 25.0000 mg | ORAL_TABLET | Freq: Two times a day (BID) | ORAL | 0 refills | Status: DC

## 2016-05-02 MED ORDER — AMLODIPINE BESYLATE 5 MG PO TABS
5.0000 mg | ORAL_TABLET | Freq: Every day | ORAL | Status: DC
  Administered 2016-05-02: 5 mg via ORAL
  Filled 2016-05-02: qty 1

## 2016-05-02 MED ORDER — SENNOSIDES-DOCUSATE SODIUM 8.6-50 MG PO TABS: 1.0000 | ORAL_TABLET | Freq: Every evening | ORAL | Status: DC | PRN

## 2016-05-02 MED ORDER — FUROSEMIDE 20 MG PO TABS: 20.0000 mg | ORAL_TABLET | Freq: Every day | ORAL | Status: DC | PRN

## 2016-05-02 MED ORDER — ASPIRIN 325 MG PO TBEC: 325.0000 mg | DELAYED_RELEASE_TABLET | Freq: Every day | ORAL | 0 refills | Status: DC

## 2016-05-02 MED ORDER — HYDROCODONE-ACETAMINOPHEN 5-325 MG PO TABS: 1.0000 | ORAL_TABLET | ORAL | 0 refills | Status: DC | PRN

## 2016-05-02 MED ORDER — ENSURE ENLIVE PO LIQD: 237.0000 mL | Freq: Two times a day (BID) | ORAL | 12 refills | Status: AC

## 2016-05-02 MED ORDER — ALUM & MAG HYDROXIDE-SIMETH 200-200-20 MG/5ML PO SUSP: 30.0000 mL | Freq: Four times a day (QID) | ORAL | 0 refills | Status: DC | PRN

## 2016-05-02 MED ORDER — AMLODIPINE BESYLATE 5 MG PO TABS: 5.0000 mg | ORAL_TABLET | Freq: Every day | ORAL | Status: DC

## 2016-05-02 NOTE — Discharge Instructions (Signed)
Follow with Primary MD Monico Blitz, MD after discharge from SNF  Get CBC, CMP,checked  by Primary MD next visit.    Activity: Per PT recommendation  Disposition SNF   Diet: Heart Healthy , carbohydrate modified , with feeding assistance and aspiration precautions.  For Heart failure patients - Check your Weight same time everyday, if you gain over 2 pounds, or you develop in leg swelling, experience more shortness of breath or chest pain, call your Primary MD immediately. Follow Cardiac Low Salt Diet and 1.5 lit/day fluid restriction.   On your next visit with your primary care physician please Get Medicines reviewed and adjusted.   Please request your Prim.MD to go over all Hospital Tests and Procedure/Radiological results at the follow up, please get all Hospital records sent to your Prim MD by signing hospital release before you go home.   If you experience worsening of your admission symptoms, develop shortness of breath, life threatening emergency, suicidal or homicidal thoughts you must seek medical attention immediately by calling 911 or calling your MD immediately  if symptoms less severe.  You Must read complete instructions/literature along with all the possible adverse reactions/side effects for all the Medicines you take and that have been prescribed to you. Take any new Medicines after you have completely understood and accpet all the possible adverse reactions/side effects.   Do not drive, operating heavy machinery, perform activities at heights, swimming or participation in water activities or provide baby sitting services if your were admitted for syncope or siezures until you have seen by Primary MD or a Neurologist and advised to do so again.  Do not drive when taking Pain medications.    Do not take more than prescribed Pain, Sleep and Anxiety Medications  Special Instructions: If you have smoked or chewed Tobacco  in the last 2 yrs please stop smoking, stop any  regular Alcohol  and or any Recreational drug use.  Wear Seat belts while driving.   Please note  You were cared for by a hospitalist during your hospital stay. If you have any questions about your discharge medications or the care you received while you were in the hospital after you are discharged, you can call the unit and asked to speak with the hospitalist on call if the hospitalist that took care of you is not available. Once you are discharged, your primary care physician will handle any further medical issues. Please note that NO REFILLS for any discharge medications will be authorized once you are discharged, as it is imperative that you return to your primary care physician (or establish a relationship with a primary care physician if you do not have one) for your aftercare needs so that they can reassess your need for medications and monitor your lab values.

## 2016-05-02 NOTE — Progress Notes (Signed)
Report called and given to Tammy at [facility]. SW made aware, paperwork and EMS transit is in the process of being established. Will continue to monitor.

## 2016-05-02 NOTE — Progress Notes (Signed)
IV removed, WNL. D/C instructions given to pt and family. EMS present to transport pt to [facility].

## 2016-05-02 NOTE — Progress Notes (Signed)
OT Cancellation Note  Patient Details Name: Joann Hunter MRN: [id number] DOB: [birth year]   Cancelled Treatment:    Reason Eval/Treat Not Completed: Fatigue/lethargy limiting ability to participate. Attempted to see patient for OT evaluation although patient was sleeping soundly. Family reports that patient slept off and on last night and received medication for sleeping. Will re-attempt evaluation when able.    Ailene Ravel, OTR/L,CBIS  [phone]  05/02/2016, 9:21 AM

## 2016-05-02 NOTE — NC FL2 (Signed)
[place] LEVEL OF CARE SCREENING TOOL     IDENTIFICATION  Patient Name: Joann Hunter Birthdate: [birth year] Sex: female Admission Date (Current Location): 04/27/2016  [facility] and [place] Number:  [employer] and Address:  [place] [address], [place]      Provider Number: [id number]  Attending Physician Name and Address:  Albertine Patricia, MD  Relative Name and Phone Number:       Current Level of Care: Hospital Recommended Level of Care: [place] Prior Approval Number:    Date Approved/Denied:   PASRR Number:   [id number]   Discharge Plan: SNF    Current Diagnoses: Patient Active Problem List   Diagnosis Date Noted  . Hip fracture, unspecified laterality, closed, initial encounter ([place]) 04/28/2016  . Ischemic cardiomyopathy 04/28/2016  . Hyperlipidemia 04/28/2016  . Cancer ([place]) 04/28/2016  . Hip fracture ([place]) 04/28/2016  . Dehydration 04/14/2016  . Orthostasis 02/24/2016  . Acute encephalopathy 02/24/2016  . Confusion 02/21/2016  . Neutropenia ([place]) 02/20/2016  . Thrombocytopenia ([place]) 02/20/2016  . ESBL (extended spectrum beta-lactamase) producing bacteria infection 02/20/2016  . Goals of care, counseling/discussion 02/02/2016  . Urothelial cancer ([place]) 01/01/2016  . Rectal bleeding 11/05/2014  . Absolute anemia 11/05/2014  . Hypothyroidism 11/05/2014  . Metabolic encephalopathy [id number]/82/9562  . Type 2 diabetes with nephropathy ([place]) 07/26/2014  . Essential hypertension 07/26/2014  . Hyponatremia 07/25/2014  . Vertigo 07/25/2014  . AKI (acute kidney injury) ([place]) 12/13/2013  . Fall 12/13/2013  . Lower urinary tract infectious disease 12/13/2013  . Chronic kidney disease (CKD), stage IV (severe) ([place]) 12/13/2013  . Chest pain at rest 04/26/2011  . Syncope, near 04/26/2011  . CAD (coronary artery disease) 04/23/2011  . Cardiomyopathy, ischemic 04/23/2011  . Dyslipidemia  04/23/2011  . Myocardial infarction, anterior wall, initial care ([place]) 04/20/2011    Orientation RESPIRATION BLADDER Height & Weight     Self, Place  Normal Continent Weight: 163 lb 12.8 oz (74.3 kg) Height:  5\' 1"  (154.9 cm)  BEHAVIORAL SYMPTOMS/MOOD NEUROLOGICAL BOWEL NUTRITION STATUS      Continent Diet (See DC summary)  AMBULATORY STATUS COMMUNICATION OF NEEDS Skin   Extensive Assist Verbally Surgical wounds, Skin abrasions, Bruising                       Personal Care Assistance Level of Assistance  Bathing, Feeding, Dressing Bathing Assistance: Limited assistance Feeding assistance: Limited assistance Dressing Assistance: Limited assistance     Functional Limitations Info  Sight, Hearing, Speech Sight Info: Adequate Hearing Info: Adequate Speech Info: Adequate    SPECIAL CARE FACTORS FREQUENCY  PT (By licensed PT), OT (By licensed OT)     PT Frequency: 5x a week OT Frequency: 5x a week            Contractures Contractures Info: Not present    Additional Factors Info  Code Status, Allergies, Isolation Precautions Code Status Info: Full Code Allergies Info: Ciprofloxacin, Lipitor Atorvastatin, Penicillins     Isolation Precautions Info: on contact in hospital     Current Medications (05/02/2016):  This is the current hospital active medication list Current Facility-Administered Medications  Medication Dose Route Frequency Provider Last Rate Last Dose  . alum & mag hydroxide-simeth (MAALOX/MYLANTA) 200-200-20 MG/5ML suspension 30 mL  30 mL Oral Q4H PRN Carole Civil, MD      . amLODipine (NORVASC) tablet 5 mg  5 mg Oral Daily Dawood S Elgergawy,  MD   5 mg at 05/02/16 0955  . aspirin EC tablet 325 mg  325 mg Oral Q breakfast Carole Civil, MD   325 mg at 05/02/16 0758  . cefTRIAXone (ROCEPHIN) 1 g in dextrose 5 % 50 mL IVPB  1 g Intravenous Q24H Albertine Patricia, MD   Stopped at 05/02/16 1045  . docusate sodium (COLACE) capsule 100 mg  100 mg  Oral BID Carole Civil, MD   100 mg at 05/02/16 0955  . feeding supplement (ENSURE ENLIVE) (ENSURE ENLIVE) liquid 237 mL  237 mL Oral BID BM Albertine Patricia, MD   237 mL at 05/02/16 0955  . hydrALAZINE (APRESOLINE) injection 5 mg  5 mg Intravenous Q4H PRN Albertine Patricia, MD      . HYDROcodone-acetaminophen (NORCO/VICODIN) 5-325 MG per tablet 1-2 tablet  1-2 tablet Oral Q6H PRN Carole Civil, MD   2 tablet at 05/02/16 [phone]  . levothyroxine (SYNTHROID, LEVOTHROID) tablet 112 mcg  112 mcg Oral q morning - 10a Orvan Falconer, MD   112 mcg at 05/02/16 0955  . LORazepam (ATIVAN) injection 0.5 mg  0.5 mg Intravenous Q4H PRN Orvan Falconer, MD   0.5 mg at 05/01/16 2204  . MEDLINE mouth rinse  15 mL Mouth Rinse BID Orvan Falconer, MD   15 mL at 05/02/16 1000  . menthol-cetylpyridinium (CEPACOL) lozenge 3 mg  1 lozenge Oral PRN Carole Civil, MD       Or  . phenol (CHLORASEPTIC) mouth spray 1 spray  1 spray Mouth/Throat PRN Carole Civil, MD      . metoCLOPramide (REGLAN) tablet 5-10 mg  5-10 mg Oral Q8H PRN Carole Civil, MD       Or  . metoCLOPramide (REGLAN) injection 5-10 mg  5-10 mg Intravenous Q8H PRN Carole Civil, MD      . metoprolol tartrate (LOPRESSOR) tablet 25 mg  25 mg Oral BID Carole Civil, MD   25 mg at 05/02/16 0955  . omega-3 acid ethyl esters (LOVAZA) capsule 1 g  1 g Oral Daily Orvan Falconer, MD   1 g at 05/02/16 0955  . ondansetron (ZOFRAN) tablet 4 mg  4 mg Oral Q6H PRN Carole Civil, MD       Or  . ondansetron [facility]) injection 4 mg  4 mg Intravenous Q6H PRN Carole Civil, MD      . pantoprazole (PROTONIX) EC tablet 40 mg  40 mg Oral Daily Orvan Falconer, MD   40 mg at 05/02/16 0955  . saccharomyces boulardii (FLORASTOR) capsule 250 mg  250 mg Oral BID Albertine Patricia, MD   250 mg at 05/02/16 0955  . senna-docusate (Senokot-S) tablet 1 tablet  1 tablet Oral QHS PRN Carole Civil, MD      . sodium chloride flush (NS) 0.9 % injection 3 mL  3 mL  Intravenous Q12H Orvan Falconer, MD   3 mL at 05/02/16 0956  . sorbitol 70 % solution 30 mL  30 mL Oral Daily PRN Carole Civil, MD      . traMADol Veat[facility]bon) tablet 50 mg  50 mg Oral Q6H PRN Carole Civil, MD   50 mg at 05/01/16 2145     Discharge Medications: Please see discharge summary for a list of discharge medications.  Relevant Imaging Results:  Relevant Lab Results:   Additional Information SSN:  [id number]  Lilly Cove, [place]

## 2016-05-02 NOTE — Clinical Social Work Placement (Addendum)
   CLINICAL SOCIAL WORK PLACEMENT  NOTE  Date:  05/02/2016  Patient Details  Name: Joann Hunter MRN: [id number] Date of Birth: 25-Apr-1932  Clinical Social Work is seeking post-discharge placement for this patient at the [place] level of care (*CSW will initial, date and re-position this form in  chart as items are completed):  Yes   Patient/family provided with [place] Work Department's list of facilities offering this level of care within the geographic area requested by the patient (or if unable, by the patient's family).  Yes   Patient/family informed of their freedom to choose among providers that offer the needed level of care, that participate in Medicare, Medicaid or managed care program needed by the patient, have an available bed and are willing to accept the patient.  Yes   Patient/family informed of [facility]'s ownership interest in [facility] and [facility], as well as of the fact that they are under no obligation to receive care at these facilities.  PASRR submitted to EDS on 05/02/16     PASRR number received on 05/02/16     Existing PASRR number confirmed on       FL2 transmitted to all facilities in geographic area requested by pt/family on 05/02/16     FL2 transmitted to all facilities within larger geographic area on       Patient informed that his/her managed care company has contracts with or will negotiate with certain facilities, including the following:            Patient/family informed of bed offers received. 05/02/2016   Patient chooses bed at      [facility]  Physician recommends and patient chooses bed at     SNF Patient to be transferred to   on  .  05/02/2016   Patient to be transferred to facility by       EMS  Patient family notified on   of transfer.  Daughter at bedside  Name of family member notified:        PHYSICIAN Please sign FL2     Additional Comment:     _______________________________________________ Lilly Cove, LCSW 05/02/2016, 11:03 AM

## 2016-05-02 NOTE — Care Management Note (Signed)
Case Management Note  Patient Details  Name: Joann Hunter MRN: [id number] Date of Birth: Jan 08, 1932     Expected Discharge Date:  05/02/16               Expected Discharge Plan:  [place]  In-House Referral:  Clinical Social Work  Discharge planning Services  CM Consult  Post Acute Care Choice:    Choice offered to:     DME Arranged:    DME Agency:     HH Arranged:    [place] Agency:     Status of Service:  Completed, signed off  If discussed at [employer], dates discussed:    Additional Comments: Patient discharging to SNF today.   Dilpreet Faires, Chauncey Reading, RN 05/02/2016, 11:35 AM

## 2016-05-02 NOTE — Clinical Social Work Note (Signed)
Clinical Social Work Assessment  Patient Details  Name: Joann Hunter MRN: [id number] Date of Birth: 1932-10-06  Date of referral:  05/02/16               Reason for consult:  Facility Placement, Discharge Planning                Permission sought to share information with:  Case Manager, Customer service manager, Family Supports Permission granted to share information::  Yes, Verbal Permission Granted  Name::        Agency::     Relationship::  Daughter in room  Contact Information:     Housing/Transportation Living arrangements for the past 2 months:  [place] of Information:  Patient, Medical Team, Case Manager, Adult Children, Facility Patient Interpreter Needed:  None Criminal Activity/Legal Involvement Pertinent to Current Situation/Hospitalization:  No - Comment as needed Significant Relationships:  Adult Children, Other Family Members Lives with:  Spouse Do you feel safe going back to the place where you live?  No Need for family participation in patient care:  Yes (Comment)  Care giving concerns:  Daughter reports patient is confused and deconditioned needing therapy. Husband is currently at [facility] SNF are [facility] and she is hoping to get bed offer at [facility] if possible. Patient is a new hip repair and recommendation is for ST SNF.  Daughter at bedside.   Social Worker assessment / plan:  SNF work up completed. Assessment completed. Patient will need bed offers with first choice being [facility] where husband is current housed. Will follow up.  Plan SNF at [place]  Employment status:  Retired Nurse, adult PT Recommendations:  [place] / Referral to community resources:  [place]  Patient/Family's Response to care:  Agreeable to plan  Patient/Family's Understanding of and Emotional Response to Diagnosis, Current Treatment, and Prognosis:  Daughter understanding needs at  this time and continued therapy.  Emotional Assessment Appearance:  Appears stated age Attitude/Demeanor/Rapport:    Affect (typically observed):  Adaptable Orientation:  Oriented to Self, Oriented to Place Alcohol / Substance use:  Not Applicable Psych involvement (Current and /or in the community):  No (Comment)  Discharge Needs  Concerns to be addressed:  No discharge needs identified Readmission within the last 30 days:  Yes Current discharge risk:  None Barriers to Discharge:  Ship broker, Continued Medical Work up   [employer], LCSW 05/02/2016, 11:01 AM

## 2016-05-02 NOTE — Discharge Summary (Signed)
Joann Hunter, is a 83 y.o. female  DOB Jan 07, 1932  MRN [id number].  Admission date:  04/27/2016  Admitting Physician  Orvan Falconer, MD  Discharge Date:  05/02/2016   Primary MD  Monico Blitz, MD  Recommendations for primary care physician for things to follow:  - Patient to follow with orthopedic in one week - Please check CBC, BMP in 3 days   Admission Diagnosis  Fall [W19.XXXA] Closed right hip fracture, initial encounter [facility]) [S72.001A]   Discharge Diagnosis  Fall [W19.XXXA] Closed right hip fracture, initial encounter ([place]) [S72.001A]   Principal Problem:   Hip fracture, unspecified laterality, closed, initial encounter [facility]) Active Problems:   Ischemic cardiomyopathy   Hyperlipidemia   Cancer ([place])   Hip fracture ([place])      Past Medical History:  Diagnosis Date  . Arthritis   . Back pain, chronic   . CAD (coronary artery disease)    NSTEMI 04/2011 with newly diagnosed 3V CAD s/p PCI/DES mLAD 04/11/11 with residual dz (per Dr. Burt Knack, should have consideration of PCI of the occluded RCA based on symptoms and/or consideration of outpatient nuclear perfusion testing after the patient recovers from her infarct)  . Cancer ([place])    kidney ( Nov.2017)  . Chronic kidney disease (CKD), stage IV (severe) ([place]) 12/13/2013  . Hyperglycemia   . Hyperlipidemia   . Hypertension   . Hypothyroidism   . Ischemic cardiomyopathy    EF 45% by cath 04/20/11, 50-55% by echo 04/22/11. hypotension limiting medication titration   . Myocardial infarction ([place]) 2013  . Type 2 diabetes with nephropathy ([place]) 07/26/2014  . UTI (urinary tract infection) 12/13/2013    Past Surgical History:  Procedure Laterality Date  . BACK SURGERY    . BREAST LUMPECTOMY     right  . CORONARY STENT PLACEMENT    . LEFT HEART CATHETERIZATION WITH CORONARY ANGIOGRAM N/A 04/20/2011   Procedure: LEFT HEART CATHETERIZATION WITH  CORONARY ANGIOGRAM;  Surgeon: Burnell Blanks, MD;  Location: [facility] CATH LAB;  Service: Cardiovascular;  Laterality: N/A;  . PERCUTANEOUS CORONARY STENT INTERVENTION (PCI-S) N/A 04/21/2011   Procedure: PERCUTANEOUS CORONARY STENT INTERVENTION (PCI-S);  Surgeon: Sherren Mocha, MD;  Location: [facility] CATH LAB;  Service: Cardiovascular;  Laterality: N/A;       History of present illness and  Hospital Course:     Kindly see H&P for history of present illness and admission details, please review complete Labs, Consult reports and Test reports for all details in brief  HPI  from the history and physical done on the day of admission 4/26 Joann Hunter is an 83 y.o. female with hx of ischemic CMP, s/p NSTEMI, hx of chronic back pain, uretheral cancer on chemotherapy every 3 weeks, hx of HTN, HLD, Hypothyrodism, type II DM with nephropathy, fell and hurt her right hip.  X ray showed right hip Fx.  CXR showed possible mild vascular congestion.  Serology showed Cr of 1.8, BS of 234, and Hb of 10 g per dL.  EKG showed NSR  with no acute ST T changes. Head CT and cervical CT showed no acute pathology.  Dr Aline Brochure was consulted by EDP and hospitalist was asked to admit her for surgical repair.   She has no CP/SOB, but was hypoxic with Sat to 86 percent, corrected to 94% on 3 L [place].      Hospital Course  83 y.o.femalewith hx of ischemic CMP, s/p NSTEMI, hx of chronic back pain, uretheral cancer on chemotherapy every 3 weeks, hx of HTN, HLD, Hypothyrodism, type II DM with nephropathy, fell and sustained right hip fracture, Went for surgical repair by Dr. Aline Brochure on 4/27.  Right hip fracture  - secondary to mechanical fall,  - Status post surgical repair by Dr. Kenton Kingfisher on 4/27,she is on aspirin 325 mg  for DVT prophylaxis , Seen by PT, recommendation for SNF placement, cont  when necessary pain medicine.  UTI - Patient with fever of 101, urinalysis was positive after Foley insertion, treated with    Rocephin, Foley was discontinued .  CKD stage IV - At baseline, she will be discharged on Lasix as needed basis  Hypothyroidism - Continue Synthroid  Stage IV Urothelial carcinoma, multifocal R ureteral masses with liver metastases - She is on chemotherapy, continue to follow with oncology as an outpatient  Type 2 diabetes - He doesn't appear to be in any home medication, CBGs are acceptable   CAD.  - She had NSTEMI 04/2011 with 3 vessel CAD s/p PCI.  - She denies any chest pain or shortness of breath, continue with aspirin - She is on Lasix as needed  Delirium - Improved , no agitation overnight.   Discharge Condition:  stable   Follow UP  Follow-up Information    Arther Abbott, MD Follow up in 1 week(s).   Specialties:  Orthopedic Surgery, Radiology Contact information: [address] [place] [address] [phone]             Discharge Instructions  and  Discharge Medications   Discharge Instructions    Discharge instructions    Complete by:  As directed    Follow with Primary MD Monico Blitz, MD after discharge from SNF  Get CBC, CMP,checked  by Primary MD next visit.    Activity: Per PT recommendation  Disposition SNF   Diet: Heart Healthy , carbohydrate modified , with feeding assistance and aspiration precautions.  For Heart failure patients - Check your Weight same time everyday, if you gain over 2 pounds, or you develop in leg swelling, experience more shortness of breath or chest pain, call your Primary MD immediately. Follow Cardiac Low Salt Diet and 1.5 lit/day fluid restriction.   On your next visit with your primary care physician please Get Medicines reviewed and adjusted.   Please request your Prim.MD to go over all Hospital Tests and Procedure/Radiological results at the follow up, please get all Hospital records sent to your Prim MD by signing hospital release before you go home.   If you experience worsening of  your admission symptoms, develop shortness of breath, life threatening emergency, suicidal or homicidal thoughts you must seek medical attention immediately by calling 911 or calling your MD immediately  if symptoms less severe.  You Must read complete instructions/literature along with all the possible adverse reactions/side effects for all the Medicines you take and that have been prescribed to you. Take any new Medicines after you have completely understood and accpet all the possible adverse reactions/side effects.   Do not drive, operating heavy machinery,  perform activities at heights, swimming or participation in water activities or provide baby sitting services if your were admitted for syncope or siezures until you have seen by Primary MD or a Neurologist and advised to do so again.  Do not drive when taking Pain medications.    Do not take more than prescribed Pain, Sleep and Anxiety Medications  Special Instructions: If you have smoked or chewed Tobacco  in the last 2 yrs please stop smoking, stop any regular Alcohol  and or any Recreational drug use.  Wear Seat belts while driving.   Please note  You were cared for by a hospitalist during your hospital stay. If you have any questions about your discharge medications or the care you received while you were in the hospital after you are discharged, you can call the unit and asked to speak with the hospitalist on call if the hospitalist that took care of you is not available. Once you are discharged, your primary care physician will handle any further medical issues. Please note that NO REFILLS for any discharge medications will be authorized once you are discharged, as it is imperative that you return to your primary care physician (or establish a relationship with a primary care physician if you do not have one) for your aftercare needs so that they can reassess your need for medications and monitor your lab values.     Allergies as  of 05/02/2016      Reactions   Ciprofloxacin Other (See Comments)   Hallucination   Lipitor [atorvastatin] Other (See Comments)   Muscle Weakness   Penicillins Rash   Has patient had a PCN reaction causing immediate rash, facial/tongue/throat swelling, SOB or lightheadedness with hypotension: unknown Has patient had a PCN reaction causing severe rash involving mucus membranes or skin necrosis: unknown Has patient had a PCN reaction that required hospitalization: unknown Has patient had a PCN reaction occurring within the last 10 years: unknown If all of the above answers are "NO", then may proceed with Cephalosporin use. 2      Medication List    STOP taking these medications   aspirin 81 MG chewable tablet Replaced by:  aspirin 325 MG EC tablet   ibuprofen 200 MG tablet Commonly known as:  ADVIL,MOTRIN   TECENTRIQ IV     TAKE these medications   amLODipine 5 MG tablet Commonly known as:  NORVASC Take 1 tablet (5 mg total) by mouth daily. Start taking on:  05/03/2016   aspirin 325 MG EC tablet Take 1 tablet (325 mg total) by mouth daily with breakfast. Continue with 325 mg oral daily for total of 30 days for DVT prophylaxis, then go back to aspirin 81 mg oral daily Start taking on:  05/03/2016 Replaces:  aspirin 81 MG chewable tablet   docusate sodium 100 MG capsule Commonly known as:  COLACE Take 1 capsule (100 mg total) by mouth 2 (two) times daily. What changed:  when to take this   feeding supplement (ENSURE ENLIVE) Liqd Take 237 mLs by mouth 2 (two) times daily between meals.   fish oil-omega-3 fatty acids 1000 MG capsule Take 1 g by mouth daily.   furosemide 20 MG tablet Commonly known as:  LASIX Take 1 tablet (20 mg total) by mouth daily as needed for fluid or edema.   HYDROcodone-acetaminophen 5-325 MG tablet Commonly known as:  NORCO/VICODIN Take 1 tablet by mouth every 4 (four) hours as needed for moderate pain.   levothyroxine 112 MCG tablet Commonly  known as:  SYNTHROID, LEVOTHROID Take 1 tablet (112 mcg total) by mouth every morning.   meclizine 25 MG tablet Commonly known as:  ANTIVERT Take 25 mg by mouth 4 (four) times daily as needed. Dizziness   metoprolol tartrate 25 MG tablet Commonly known as:  LOPRESSOR Take 1 tablet (25 mg total) by mouth 2 (two) times daily.   nitroGLYCERIN 0.4 MG SL tablet Commonly known as:  NITROSTAT Place 0.4 mg under the tongue every 5 (five) minutes x 3 doses as needed. For chest pain.   ondansetron 8 MG tablet Commonly known as:  ZOFRAN Take 1 tablet (8 mg total) by mouth every 8 (eight) hours as needed for nausea or vomiting.   pantoprazole 40 MG tablet Commonly known as:  PROTONIX Take 1 tablet (40 mg total) by mouth daily.   prochlorperazine 10 MG tablet Commonly known as:  COMPAZINE Take 1 tablet (10 mg total) by mouth every 6 (six) hours as needed for nausea or vomiting.   senna-docusate 8.6-50 MG tablet Commonly known as:  Senokot-S Take 1 tablet by mouth at bedtime as needed for mild constipation.   Vitamin D (Ergocalciferol) 50000 units Caps capsule Commonly known as:  DRISDOL Take 50,000 Units by mouth 2 (two) times a week. Tuesdays and Thurdays         Diet and Activity recommendation: See Discharge Instructions above   Consults obtained -  Ortho   Major procedures and Radiology Reports - PLEASE review detailed and final reports for all details, in brief -   ARTHROPLASTY BIPOLAR HIP (HEMIARTHROPLASTY) (Right) by Dr. Aline Brochure on 4/27   Dg Chest 1 View  Result Date: 04/27/2016 CLINICAL DATA:  83 year old female with fall and right hip pain. EXAM: CHEST 1 VIEW COMPARISON:  Chest radiograph dated 02/24/2016 FINDINGS: There is mild cardiomegaly. Mild central and perihilar interstitial streaky density may represent mild vascular congestion. An atypical or viral pneumonia is not entirely excluded. Clinical correlation is recommended. There is no focal consolidation,  pleural effusion, or pneumothorax. Coronary vascular calcification as well as atherosclerotic calcification of the aortic arch. There is osteopenia with degenerative changes of the spine. No acute fracture. IMPRESSION: Mild cardiomegaly with possible mild vascular congestion. No focal consolidation. Electronically Signed   By: Anner Crete M.D.   On: 04/27/2016 22:38   Ct Head Wo Contrast  Result Date: 04/27/2016 CLINICAL DATA:  83 year old female with fall. EXAM: CT HEAD WITHOUT CONTRAST CT CERVICAL SPINE WITHOUT CONTRAST TECHNIQUE: Multidetector CT imaging of the head and cervical spine was performed following the standard protocol without intravenous contrast. Multiplanar CT image reconstructions of the cervical spine were also generated. COMPARISON:  Head CT dated 02/24/2016 FINDINGS: CT HEAD FINDINGS Brain: There is moderate age-related atrophy and chronic microvascular ischemic changes is no acute intracranial hemorrhage. No mass effect or midline shift noted. No intra-axial fluid collection. Vascular: No hyperdense vessel or unexpected calcification. Skull: Normal. Negative for fracture or focal lesion. Sinuses/Orbits: No acute finding. Other: None CT CERVICAL SPINE FINDINGS Alignment: No acute subluxation. New there is straightening of the normal cervical lordosis which may be positional or due to muscle spasm or secondary to degenerative changes. Skull base and vertebrae: No acute fracture. Incomplete bony fusion of the posterior ring of C1. The bones are osteopenic. Soft tissues and spinal canal: No prevertebral fluid or swelling. No visible canal hematoma. Disc levels: Multilevel degenerative changes with endplate irregularity and disc space narrowing most prominent involving C5-C6 and C6-C7. There is anterior osteophyte at C4-C5 and  C5-C6. Multilevel facet hypertrophy with associated neural foramina narrowing most prominent at C3-C4 on the left. Upper chest: Bilateral carotid bulb atherosclerotic  plaques. The visualized lung bases are clear. Other: None IMPRESSION: 1. No acute intracranial hemorrhage. Age-related atrophy and chronic microvascular ischemic changes. 2. No acute/traumatic cervical spine pathology. Multilevel degenerative changes. Electronically Signed   By: Anner Crete M.D.   On: 04/27/2016 22:47   Ct Head Wo Contrast  Result Date: 04/18/2016 CLINICAL DATA:  New headaches, dizziness and falls for 3 weeks. History of stage IV urothelial carcinoma. EXAM: CT HEAD WITHOUT CONTRAST TECHNIQUE: Contiguous axial images were obtained from the base of the skull through the vertex without intravenous contrast. COMPARISON:  CT HEAD February 21st 1,018 FINDINGS: BRAIN: No intraparenchymal hemorrhage, mass effect nor midline shift. The ventricles and sulci are normal for age. Patchy supratentorial white matter hypodensities within normal range for patient's age, though non-specific are most compatible with chronic small vessel ischemic disease. Old LEFT pontine lacunar infarct. Old LEFT cerebellar small infarcts. No acute large vascular territory infarcts. No abnormal extra-axial fluid collections. Basal cisterns are patent. VASCULAR: Moderate calcific atherosclerosis of the carotid siphons. SKULL: No skull fracture. Moderate RIGHT temporomandibular osteoarthrosis. Osteopenia. No significant scalp soft tissue swelling. SINUSES/ORBITS: Trace paranasal sinus mucosal thickening. Mastoid air cells are well aerated. The included ocular globes and orbital contents are non-suspicious. OTHER: Patient is edentulous. IMPRESSION: No acute intracranial process. Stable examination including moderate chronic small vessel ischemic disease and old small LEFT cerebellar infarcts. Electronically Signed   By: Elon Alas M.D.   On: 04/18/2016 22:23   Ct Cervical Spine Wo Contrast  Result Date: 04/27/2016 CLINICAL DATA:  83 year old female with fall. EXAM: CT HEAD WITHOUT CONTRAST CT CERVICAL SPINE WITHOUT  CONTRAST TECHNIQUE: Multidetector CT imaging of the head and cervical spine was performed following the standard protocol without intravenous contrast. Multiplanar CT image reconstructions of the cervical spine were also generated. COMPARISON:  Head CT dated 02/24/2016 FINDINGS: CT HEAD FINDINGS Brain: There is moderate age-related atrophy and chronic microvascular ischemic changes is no acute intracranial hemorrhage. No mass effect or midline shift noted. No intra-axial fluid collection. Vascular: No hyperdense vessel or unexpected calcification. Skull: Normal. Negative for fracture or focal lesion. Sinuses/Orbits: No acute finding. Other: None CT CERVICAL SPINE FINDINGS Alignment: No acute subluxation. New there is straightening of the normal cervical lordosis which may be positional or due to muscle spasm or secondary to degenerative changes. Skull base and vertebrae: No acute fracture. Incomplete bony fusion of the posterior ring of C1. The bones are osteopenic. Soft tissues and spinal canal: No prevertebral fluid or swelling. No visible canal hematoma. Disc levels: Multilevel degenerative changes with endplate irregularity and disc space narrowing most prominent involving C5-C6 and C6-C7. There is anterior osteophyte at C4-C5 and C5-C6. Multilevel facet hypertrophy with associated neural foramina narrowing most prominent at C3-C4 on the left. Upper chest: Bilateral carotid bulb atherosclerotic plaques. The visualized lung bases are clear. Other: None IMPRESSION: 1. No acute intracranial hemorrhage. Age-related atrophy and chronic microvascular ischemic changes. 2. No acute/traumatic cervical spine pathology. Multilevel degenerative changes. Electronically Signed   By: Anner Crete M.D.   On: 04/27/2016 22:47   Dg Pelvis Portable  Result Date: 04/29/2016 CLINICAL DATA:  Postop for right hip replacement. EXAM: PORTABLE PELVIS 1-2 VIEWS COMPARISON:  CT of 10/28/2015 FINDINGS: AP view of the pelvis  demonstrates right hip arthroplasty. No periprosthetic fracture or acute abnormality identified. Advanced left hip osteoarthritis. Degenerative disc disease at the  lumbosacral junction. IMPRESSION: Expected appearance after right hip arthroplasty. Advanced left hip osteoarthritis. Electronically Signed   By: Abigail Miyamoto M.D.   On: 04/29/2016 13:40   Dg Hip Unilat  With Pelvis 2-3 Views Right  Result Date: 04/27/2016 CLINICAL DATA:  83 year old female with fall and right hip pain. EXAM: DG HIP (WITH OR WITHOUT PELVIS) 2-3V RIGHT COMPARISON:  Radiograph dated 11/05/2014 FINDINGS: There is a minimally displaced fracture of the base of the right femoral neck with foreshortened appearance of the femoral neck. The femoral head remains in anatomic alignment with the acetabulum. No other acute fracture identified. The bones are osteopenic. There is advanced osteoarthritic changes of the left hip. Soft tissues appear unremarkable. IMPRESSION: Minimally displaced basicervical fracture of the right femoral neck. No dislocation. Advanced osteoarthritic changes of the left hip. Electronically Signed   By: Anner Crete M.D.   On: 04/27/2016 22:34    Micro Results    Recent Results (from the past 240 hour(s))  Surgical PCR screen     Status: None   Collection Time: 04/28/16  3:26 AM  Result Value Ref Range Status   MRSA, PCR NEGATIVE NEGATIVE Final   Staphylococcus aureus NEGATIVE NEGATIVE Final    Comment:        The Xpert SA Assay (FDA approved for NASAL specimens in patients over 71 years of age), is one component of a comprehensive surveillance program.  Test performance has been validated by [facility] for patients greater than or equal to 61 year old. It is not intended to diagnose infection nor to guide or monitor treatment.   Culture, Urine     Status: Abnormal   Collection Time: 04/29/16  7:17 AM  Result Value Ref Range Status   Specimen Description URINE, CLEAN CATCH  Final    Special Requests NONE  Final   Culture (A)  Final    <10,000 COLONIES/mL INSIGNIFICANT GROWTH Performed at [facility], [address], [place], [place] [address]    Report Status 05/01/2016 FINAL  Final       Today   Subjective:   Aolanis Crispen today with no new back and eventually overnight, family friend at bedside was in October with her, denies any agitation, reports she had a good night sleep.  Objective:   Blood pressure (!) 149/54, pulse 73, temperature 99.7 F (37.6 C), temperature source Oral, resp. rate 18, height 5\' 1"  (1.549 m), weight 74.3 kg (163 lb 12.8 oz), SpO2 91 %.   Intake/Output Summary (Last 24 hours) at 05/02/16 1126 Last data filed at 05/02/16 0900  Gross per 24 hour  Intake              963 ml  Output              400 ml  Net              563 ml    Exam Awake alert, no apparent distress Symmetrical Chest wall movement, Good air movement bilaterally, CTAB RRR,No Gallops,Rubs or new Murmurs, No Parasternal Heave +ve B.Sounds, Abd Soft, Non tender, No rebound -guarding or rigidity. No Cyanosis, Clubbing or edema, No new Rash or bruise, no oozing or discharge through bandage of right hip surgical wound  Data Review   CBC w Diff:  Lab Results  Component Value Date   WBC 5.5 05/01/2016   HGB 10.2 (L) 05/01/2016   HCT 30.6 (L) 05/01/2016   PLT 154 05/01/2016   LYMPHOPCT 10 04/27/2016  MONOPCT 8 04/27/2016   EOSPCT 2 04/27/2016   BASOPCT 0 04/27/2016    CMP:  Lab Results  Component Value Date   NA 132 (L) 05/01/2016   K 3.6 05/01/2016   CL 98 (L) 05/01/2016   CO2 27 05/01/2016   BUN 18 05/01/2016   CREATININE 1.42 (H) 05/01/2016   PROT 5.3 (L) 04/28/2016   ALBUMIN 2.6 (L) 04/28/2016   BILITOT 0.5 04/28/2016   ALKPHOS 96 04/28/2016   AST 19 04/28/2016   ALT 12 (L) 04/28/2016  .   Total Time in preparing paper work, data evaluation and todays exam - 35 minutes  Carmina Walle M.D on 05/02/2016 at 11:26 AM  [facility]   Office  [phone]

## 2016-05-02 NOTE — Progress Notes (Signed)
Physical Therapy Treatment Patient Details Name: Joann Hunter MRN: [id number] DOB: 06-30-1932 Today's Date: 05/02/2016    History of Present Illness Joann Hunter is an 83 y.o. female with hx of ischemic CMP, s/p NSTEMI, hx of chronic back pain, uretheral cancer on chemotherapy every 3 weeks, hx of HTN, HLD, Hypothyrodism, type II DM with nephropathy, fell and hurt her right hip.  X ray showed right hip Fx.  CXR showed possible mild vascular congestion.  Serology showed Cr of 1.8, BS of 234, and Hb of 10 g per dL.  EKG showed NSR with no acute ST T changes. Head CT and cervical CT showed no acute pathology.  Dr Aline Brochure was consulted by EDP and hospitalist was asked to admit her for surgical repair.   She has no CP/SOB, but was hypoxic with Sat to 86 percent, corrected to 94% on 3 L [place].    PT Comments    Pt received in bed, and was agreeable to PT tx, however she is very lethargic.  Dtr present.  Pt required Max A for transfer supine<>sit and then required Mod A to supervision to maintain static sitting balance.  She was not appropriate to attempt sit<>stand trials today due to inability to remain awake during tx.  Continue to recommend SNF at this time due to need for increased level of care for all functional mobility.     Follow Up Recommendations  SNF     Equipment Recommendations  None recommended by PT    Recommendations for Other Services       Precautions / Restrictions Precautions Precautions: Fall Precaution Comments: Lateral hip precautions    Mobility  Bed Mobility Overal bed mobility: Needs Assistance       Supine to sit: Max assist;Total assist Sit to supine: Max assist;Total assist   General bed mobility comments: Pt required Mod A/supervision for static sitting balance due to lethargy, and inability to keep eyes open.    Transfers Overall transfer level:  (Not appropriate to attempt standing at this time due to lethargy)                   Ambulation/Gait                 Stairs            Wheelchair Mobility    Modified Rankin (Stroke Patients Only)       Balance Overall balance assessment: Needs assistance Sitting-balance support: Feet supported;Bilateral upper extremity supported Sitting balance-Leahy Scale: Fair Sitting balance - Comments: Requires Mod A initially to maintain static sitting posture due to lethargy, however she was then able to maintain with supervision.                                     Cognition Arousal/Alertness: Lethargic;Suspect due to medications (During the night, pt received both Ativan and Haldol)   Overall Cognitive Status: Difficult to assess                                        Exercises      General Comments        Pertinent Vitals/Pain Pain Assessment: Faces Faces Pain Scale: Hurts even more Pain Location: R hip Pain Descriptors / Indicators: Grimacing Pain Intervention(s): Limited activity within patient's tolerance;Monitored during session;Repositioned  Home Living                      Prior Function            PT Goals (current goals can now be found in the care plan section) Acute Rehab PT Goals Patient Stated Goal: to go home PT Goal Formulation: With patient Time For Goal Achievement: 05/05/16 Potential to Achieve Goals: Good Progress towards PT goals: Not progressing toward goals - comment (Due to lethargy after receiving Ativan and Haldol during the night. )    Frequency    7X/week      PT Plan Current plan remains appropriate    Co-evaluation              AM-PAC PT "6 Clicks" Daily Activity  Outcome Measure  Difficulty turning over in bed (including adjusting bedclothes, sheets and blankets)?: A Lot Difficulty moving from lying on back to sitting on the side of the bed? : A Lot Difficulty sitting down on and standing up from a chair with arms (e.g., wheelchair, bedside  commode, etc,.)?: Total Help needed moving to and from a bed to chair (including a wheelchair)?: Total Help needed walking in hospital room?: Total Help needed climbing 3-5 steps with a railing? : Total 6 Click Score: 8    End of Session   Activity Tolerance: Patient limited by lethargy Patient left: in bed;with family/visitor present Nurse Communication: Mobility status Marye Round, RN notified of pt's mobility status. ) PT Visit Diagnosis: Unsteadiness on feet (R26.81);History of falling (Z91.81);Muscle weakness (generalized) (M62.81);Difficulty in walking, not elsewhere classified (R26.2);Pain Pain - Right/Left: Right Pain - part of body: Hip     Time: 6962-9528 PT Time Calculation (min) (ACUTE ONLY): 19 min  Charges:  $Therapeutic Activity: 8-22 mins                    G Codes:       Beth Laiyah Exline, PT, DPT X: [phone]

## 2016-05-05 ENCOUNTER — Ambulatory Visit (HOSPITAL_COMMUNITY): Payer: Medicare Other | Admitting: Oncology

## 2016-05-05 ENCOUNTER — Other Ambulatory Visit (HOSPITAL_COMMUNITY): Payer: Medicare Other

## 2016-05-05 ENCOUNTER — Ambulatory Visit (HOSPITAL_COMMUNITY): Payer: Medicare Other

## 2016-05-06 ENCOUNTER — Encounter: Payer: Self-pay | Admitting: Orthopedic Surgery

## 2016-05-06 ENCOUNTER — Ambulatory Visit (INDEPENDENT_AMBULATORY_CARE_PROVIDER_SITE_OTHER): Payer: Self-pay | Admitting: Orthopedic Surgery

## 2016-05-06 DIAGNOSIS — S72001D Fracture of unspecified part of neck of right femur, subsequent encounter for closed fracture with routine healing: Secondary | ICD-10-CM

## 2016-05-06 NOTE — Patient Instructions (Signed)
Weightbearing as tolerated  Direct lateral hip precautions  Remove staples April 10  Follow-up 4 weeks

## 2016-05-06 NOTE — Progress Notes (Signed)
Postop visit #1  Chief Complaint  Patient presents with  . Follow-up    POST OP 1, RIGHT HIP FRACTURE, DOS 04/29/16    Bipolar replacement right hip for fracture femoral neck  The patient is ambulating well she is now postop day #7 her wound looks clean dry and intact she has no ankle edema she has no signs of DVT she has no sign of infection her leg lengths are equal limb alignment is normal  Recommend continue physical therapy remove staples on May 10 and follow-up in 4 weeks for examination only

## 2016-06-03 ENCOUNTER — Encounter: Payer: Self-pay | Admitting: Orthopedic Surgery

## 2016-06-03 ENCOUNTER — Ambulatory Visit (INDEPENDENT_AMBULATORY_CARE_PROVIDER_SITE_OTHER): Payer: Self-pay | Admitting: Orthopedic Surgery

## 2016-06-03 DIAGNOSIS — S72001D Fracture of unspecified part of neck of right femur, subsequent encounter for closed fracture with routine healing: Secondary | ICD-10-CM

## 2016-06-03 DIAGNOSIS — Z4889 Encounter for other specified surgical aftercare: Secondary | ICD-10-CM

## 2016-06-03 MED ORDER — HYDROCODONE-ACETAMINOPHEN 5-325 MG PO TABS: 1.0000 | ORAL_TABLET | Freq: Four times a day (QID) | ORAL | 0 refills | Status: DC | PRN

## 2016-06-03 NOTE — Progress Notes (Signed)
This is a postop visit  Chief Complaint  Patient presents with  . Follow-up    RT BIPOLAR HIP, DOS 04/29/16    The patient is ambulatory with a walker she's pain-free  Her skin incision looks good her leg lengths are equal limb alignment is normal hip flexion 120  She'll follow up in 2 months  Her postop one month film shows a stable bipolar hip without any complications  Encounter Diagnoses  Name Primary?  . Closed fracture of neck of right femur with routine healing, subsequent encounter Yes  . Aftercare following surgery

## 2016-06-08 ENCOUNTER — Encounter (HOSPITAL_COMMUNITY): Payer: Self-pay

## 2016-06-08 ENCOUNTER — Encounter (HOSPITAL_COMMUNITY): Payer: Medicare Other | Attending: Hematology & Oncology | Admitting: Oncology

## 2016-06-08 ENCOUNTER — Ambulatory Visit (HOSPITAL_COMMUNITY): Payer: Medicare Other | Admitting: Adult Health

## 2016-06-08 VITALS — BP 114/49 | HR 61 | Temp 97.7°F | Resp 18 | Wt 156.0 lb

## 2016-06-08 DIAGNOSIS — E039 Hypothyroidism, unspecified: Secondary | ICD-10-CM | POA: Diagnosis not present

## 2016-06-08 DIAGNOSIS — I10 Essential (primary) hypertension: Secondary | ICD-10-CM

## 2016-06-08 DIAGNOSIS — C689 Malignant neoplasm of urinary organ, unspecified: Secondary | ICD-10-CM | POA: Insufficient documentation

## 2016-06-08 DIAGNOSIS — C787 Secondary malignant neoplasm of liver and intrahepatic bile duct: Secondary | ICD-10-CM

## 2016-06-08 NOTE — Progress Notes (Signed)
[place], [place] [address]   CLINIC:  Medical Oncology/Hematology  PCP:  Monico Blitz, MD [address] [place] [address] [phone]   REASON FOR VISIT:  Follow-up for Stage IV Urothelial Carcinoma with liver mets  CURRENT THERAPY: Tecentriq IV every 21 days, beginning on 02/04/16   BRIEF ONCOLOGIC HISTORY:    Urothelial cancer ([place])   10/22/2015 Imaging    Presented with recurrent UTI and hematuria. Underwent cystoscopy and retrograde evaluation with Dr. Exie Parody revealed multiple filling defects in distal (R) ureter. Marked irregularly of proximal (R) ureter concerning for malignancy. (+) hydronephrosis noted and tumor noted out of ureteral orifice.        11/19/2015 Imaging    MRI abdomen: 3.2 cm mass in segment 4B of left hepatic lobe, suspicious for liver metastasis. Moderate right hydronephrosis and diffuse renal parenchymal atrophy. Abnormal signal intensity throughout right renal collecting system and proximal right ureter, consistent with known urothelial carcinoma. No other sites of abdominal metastatic disease identified.      12/07/2015 Procedure    Liver biopsy: Poorly differentiated carcinoma consistent with metastatic transitional cell histology.       01/01/2016 Initial Diagnosis    Urothelial carcinoma ([place])     02/04/2016 -  Chemotherapy    atezolizumab (TECENTRIQ) 1,200 mg IV every 21 days         HISTORY OF PRESENT ILLNESS (From Dr. Donald Pore note on 01/27/2016):  [name] 83 y.o. femaleis here because of referral from Dr. Alen Blew for ureteral cancer. The patient lives closer to [facility] and felt it would be more convenient for her and her family to follow-up in [place].  Per Dr. Hazeline Junker note on 12/18/2015: "83 year old woman with history of hypertension, hyperlipidemia and coronary artery disease. She has been noted to have recurrent UTI and hematuria but subsequently underwent a  cystoscopy and retrograde evaluation by Dr. Exie Parody in October 2017. Retrograde evaluation showed multiple filling defects in the distal right ureter. There is marked irregularity of the proximal right ureter specialist for malignant process. She also had an ultrasound which showed hydronephrosis and her cystoscopy on 10/22/2015 showed a tumor noted out of the ureteral orifice. MRI of the abdomen obtained on 11/19/2015 showed a 3.2 cm mass of the left hepatic lobe suspicious for liver metastasis. Moderate right hydronephrosis was also noted. No other sites of abdominal metastasis noted. She underwent a biopsy of her liver lesion on 12/07/2015 which showed metastatic carcinoma consistent with transitional cell histology. She was referred to me for evaluation regarding these findings. Clinically, she is asymptomatic from this. She is no longer reporting any hematuria, dysuria or abdominal pain. She does not report any flank pain. Her performance status is limited related to arthritis. She ambulates short distances inside her house. She relies on her daughter for most activities of daily living."      INTERVAL HISTORY:  Ms. Gonsalez presents today for follow-up for Stage IV urothelial carcinoma and consideration for next cycle of Tecentriq.  She is seen in a wheelchair, accompanied by her daughter today.    Since her last visit patient has had hip surgery for a hip fracture after she fell. She was in rehabilitation for about 5-6 weeks. Patient just got out of rehabilitation. Currently her mobility is a lot better and she uses walker to ambulate at home. She has not had any immunotherapies since 03/24/16. Overall she states that she feels well. She states her previous  dizziness has resolved. She denies any chest pain, shortness breath, abdominal pain, focal weakness. She has chronic joint pains. She states that she is eating well and sleeping well and denies any weight loss.   REVIEW OF SYSTEMS:  Review of  Systems  Constitutional: Negative for appetite change, chills, fatigue, fever and unexpected weight change.  HENT:  Negative.  Negative for mouth sores.   Eyes: Negative.   Respiratory: Negative.  Negative for cough and shortness of breath.   Cardiovascular: Negative for leg swelling.  Gastrointestinal: Negative for abdominal pain, blood in stool, constipation, diarrhea, nausea and vomiting.  Endocrine: Negative.   Genitourinary: Negative.  Negative for dysuria, hematuria and vaginal bleeding.   Musculoskeletal: Positive for arthralgias (chronic arthritis ). Negative for back pain.  Skin: Negative.  Negative for rash.  Neurological: Negative for dizziness and headaches.  Hematological: Does not bruise/bleed easily.  Psychiatric/Behavioral: Negative.  Negative for sleep disturbance.  All other systems reviewed and are negative.   PAST MEDICAL/SURGICAL HISTORY:  Past Medical History:  Diagnosis Date  . Arthritis   . Back pain, chronic   . CAD (coronary artery disease)    NSTEMI 04/2011 with newly diagnosed 3V CAD s/p PCI/DES mLAD 04/11/11 with residual dz (per Dr. Burt Knack, should have consideration of PCI of the occluded RCA based on symptoms and/or consideration of outpatient nuclear perfusion testing after the patient recovers from her infarct)  . Cancer ([place])    kidney ( Nov.2017)  . Chronic kidney disease (CKD), stage IV (severe) ([place]) 12/13/2013  . Hyperglycemia   . Hyperlipidemia   . Hypertension   . Hypothyroidism   . Ischemic cardiomyopathy    EF 45% by cath 04/20/11, 50-55% by echo 04/22/11. hypotension limiting medication titration   . Myocardial infarction ([place]) 2013  . Type 2 diabetes with nephropathy ([place]) 07/26/2014  . UTI (urinary tract infection) 12/13/2013   Past Surgical History:  Procedure Laterality Date  . BACK SURGERY    . BREAST LUMPECTOMY     right  . CORONARY STENT PLACEMENT    . HIP ARTHROPLASTY Right 04/29/2016   Procedure: ARTHROPLASTY BIPOLAR HIP  (HEMIARTHROPLASTY);  Surgeon: Carole Civil, MD;  Location: [facility];  Service: Orthopedics;  Laterality: Right;  . LEFT HEART CATHETERIZATION WITH CORONARY ANGIOGRAM N/A 04/20/2011   Procedure: LEFT HEART CATHETERIZATION WITH CORONARY ANGIOGRAM;  Surgeon: Burnell Blanks, MD;  Location[facility] LAB;  Service: Cardiovascular;  Laterality: N/A;  . PERCUTANEOUS CORONARY STENT INTERVENTION (PCI-S) N/A 04/21/2011   Procedure: PERCUTANEOUS CORONARY STENT INTERVENTION (PCI-S);  Surgeon: Sherren Mocha, MD;  Location: [facility] CATH LAB;  Service: Cardiovascular;  Laterality: N/A;     SOCIAL HISTORY:  Social History   Social History  . Marital status: Married    Spouse name: N/A  . Number of children: N/A  . Years of education: N/A   Occupational History  . Not on file.   Social History Main Topics  . Smoking status: Never Smoker  . Smokeless tobacco: Never Used  . Alcohol use No  . Drug use: No  . Sexual activity: No     Comment: married 61 years-husband at [employer]   Other Topics Concern  . Not on file   Social History Narrative   Lives in [place], [place] with her husband and daughter.    FAMILY HISTORY:  Family History  Problem Relation Age of Onset  . Other Mother   . Cancer Father   . Other Unknown  no known family cardiac disease  . Other Brother        Diptheria  . Depression Sister   . Pneumonia Sister   . Other Brother        murdered    CURRENT MEDICATIONS:  Outpatient Encounter Prescriptions as of 06/08/2016  Medication Sig Note  . amLODipine (NORVASC) 5 MG tablet Take 1 tablet (5 mg total) by mouth daily.   Marland Kitchen aspirin EC 325 MG EC tablet Take 1 tablet (325 mg total) by mouth daily with breakfast. Continue with 325 mg oral daily for total of 30 days for DVT prophylaxis, then go back to aspirin 81 mg oral daily   . docusate sodium (COLACE) 100 MG capsule Take 1 capsule (100 mg total) by mouth 2 (two) times daily. (Patient taking differently: Take 100 mg by  mouth daily. )   . feeding supplement, ENSURE ENLIVE, (ENSURE ENLIVE) LIQD Take 237 mLs by mouth 2 (two) times daily between meals.   . fish oil-omega-3 fatty acids 1000 MG capsule Take 1 g by mouth daily.   . furosemide (LASIX) 20 MG tablet Take 1 tablet (20 mg total) by mouth daily as needed for fluid or edema.   Marland Kitchen HYDROcodone-acetaminophen (NORCO/VICODIN) 5-325 MG tablet Take 1 tablet by mouth every 6 (six) hours as needed for moderate pain.   Marland Kitchen levothyroxine (SYNTHROID, LEVOTHROID) 112 MCG tablet Take 1 tablet (112 mcg total) by mouth every morning.   . meclizine (ANTIVERT) 25 MG tablet Take 25 mg by mouth 4 (four) times daily as needed. Dizziness   . metoprolol tartrate (LOPRESSOR) 25 MG tablet Take 1 tablet (25 mg total) by mouth 2 (two) times daily.   . nitroGLYCERIN (NITROSTAT) 0.4 MG SL tablet Place 0.4 mg under the tongue every 5 (five) minutes x 3 doses as needed. For chest pain. 04/04/2014: Has not taken  . ondansetron (ZOFRAN) 8 MG tablet Take 1 tablet (8 mg total) by mouth every 8 (eight) hours as needed for nausea or vomiting.   . pantoprazole (PROTONIX) 40 MG tablet Take 1 tablet (40 mg total) by mouth daily.   . prochlorperazine (COMPAZINE) 10 MG tablet Take 1 tablet (10 mg total) by mouth every 6 (six) hours as needed for nausea or vomiting.   . senna-docusate (SENOKOT-S) 8.6-50 MG tablet Take 1 tablet by mouth at bedtime as needed for mild constipation.   . Vitamin D, Ergocalciferol, (DRISDOL) 50000 UNITS CAPS Take 50,000 Units by mouth 2 (two) times a week. Tuesdays and Thurdays    No facility-administered encounter medications on file as of 06/08/2016.      ALLERGIES:  Allergies  Allergen Reactions  . Ciprofloxacin Other (See Comments)    Hallucination  . Lipitor [Atorvastatin] Other (See Comments)    Muscle Weakness  . Penicillins Rash    Has patient had a PCN reaction causing immediate rash, facial/tongue/throat swelling, SOB or lightheadedness with hypotension:  unknown Has patient had a PCN reaction causing severe rash involving mucus membranes or skin necrosis: unknown Has patient had a PCN reaction that required hospitalization: unknown Has patient had a PCN reaction occurring within the last 10 years: unknown If all of the above answers are "NO", then may proceed with Cephalosporin use. 2     PHYSICAL EXAM:  ECOG Performance status: 2 - Symptomatic; requires assistance   Vitals:   06/08/16 1348  BP: (!) 114/49  Pulse: 61  Resp: 18  Temp: 97.7 F (36.5 C)   Filed Weights   06/08/16 1359  Weight: 156 lb (70.8 kg)    Physical Exam  Constitutional: She is oriented to person, place, and time and well-developed, well-nourished, and in no distress. No distress.  Exam done in wheelchair.   HENT:  Head: Normocephalic and atraumatic.  Mouth/Throat: Oropharynx is clear and moist. No oropharyngeal exudate.  She is mildly hard of hearing   Eyes: Conjunctivae are normal. Pupils are equal, round, and reactive to light. No scleral icterus.  Neck: Normal range of motion. Neck supple. No JVD present.  Cardiovascular: Normal rate, regular rhythm and normal heart sounds.  Exam reveals no gallop and no friction rub.   No murmur heard. Pulmonary/Chest: Effort normal and breath sounds normal. No respiratory distress. She has no wheezes. She has no rales.  Abdominal: Soft. Bowel sounds are normal. She exhibits no distension. There is no tenderness. There is no rebound and no guarding.  Musculoskeletal: Normal range of motion. She exhibits no edema or tenderness.  Lymphadenopathy:    She has no cervical adenopathy.  Neurological: She is alert and oriented to person, place, and time. No cranial nerve deficit.  Skin: Skin is warm and dry. No rash noted. No erythema. No pallor.  Psychiatric: Mood, memory, affect and judgment normal.  Nursing note and vitals reviewed.   LABORATORY DATA:  I have reviewed the labs as listed.  CBC    Component Value  Date/Time   WBC 5.5 05/01/2016 0623   RBC 3.33 (L) 05/01/2016 0623   HGB 10.2 (L) 05/01/2016 0623   HCT 30.6 (L) 05/01/2016 0623   PLT 154 05/01/2016 0623   MCV 91.9 05/01/2016 0623   MCH 30.6 05/01/2016 0623   MCHC 33.3 05/01/2016 0623   RDW 14.7 05/01/2016 0623   LYMPHSABS 0.7 04/27/2016 2252   MONOABS 0.5 04/27/2016 2252   EOSABS 0.2 04/27/2016 2252   BASOSABS 0.0 04/27/2016 2252   CMP Latest Ref Rng & Units 05/01/2016 04/30/2016 04/29/2016  Glucose 65 - 99 mg/dL 146(H) 135(H) 193(H)  BUN 6 - 20 mg/dL 18 15 13   Creatinine 0.44 - 1.00 mg/dL 1.42(H) 1.44(H) 1.59(H)  Sodium 135 - 145 mmol/L 132(L) 134(L) 134(L)  Potassium 3.5 - 5.1 mmol/L 3.6 3.8 3.9  Chloride 101 - 111 mmol/L 98(L) 100(L) 99(L)  CO2 22 - 32 mmol/L 27 28 29   Calcium 8.9 - 10.3 mg/dL 8.0(L) 8.0(L) 8.3(L)  Total Protein 6.5 - 8.1 g/dL - - -  Total Bilirubin 0.3 - 1.2 mg/dL - - -  Alkaline Phos 38 - 126 U/L - - -  AST 15 - 41 U/L - - -  ALT 14 - 54 U/L - - -    PENDING LABS:    DIAGNOSTIC IMAGING:  MRI abdomen: 11/19/15     PATHOLOGY:  Liver biopsy: 12/07/15       ASSESSMENT & PLAN:   Stage IV Urothelial carcinoma, multifocal R ureteral masses with liver metastases: -s/p 3 cycles of Tecentriq thus far. Last dose of therapy was 03/24/16.  -Restaging CT c/a/p w/o contrast ordered for this Friday Friday afternoon -Reschedule tecentriq treatments to restart next week.  -I have discussed with the patient that she is still quite frail and given her elderly age, if she has many side effects with tecentriq on her next cycle , then I would recommend to stop all therapy and change focus to comfort measures.  -RTC in 10 days after tecentriq treatment to assess tolerance to treatment.     Orders placed this encounter:  Orders Placed This Encounter  Procedures  .  CT Abdomen Pelvis Wo Contrast  . CT Chest Wo Contrast    Twana First, MD

## 2016-06-08 NOTE — Patient Instructions (Signed)
[facility] Discharge Instructions  RECOMMENDATIONS MADE BY THE CONSULTANT AND ANY TEST RESULTS WILL BE SENT TO YOUR REFERRING PHYSICIAN.  You saw Dr. Zhou today.  Thank you for choosing [facility] at [facility] to provide your oncology and hematology care.  To afford each patient quality time with our provider, please arrive at least 15 minutes before your scheduled appointment time.    If you have a lab appointment with the Cancer Center please come in thru the  Main Entrance and check in at the main information desk  You need to re-schedule your appointment should you arrive 10 or more minutes late.  We strive to give you quality time with our providers, and arriving late affects you and other patients whose appointments are after yours.  Also, if you no show three or more times for appointments you may be dismissed from the clinic at the providers discretion.     Again, thank you for choosing [facility].  Our hope is that these requests will decrease the amount of time that you wait before being seen by our physicians.       _____________________________________________________________  Should you have questions after your visit to [facility], please contact our office at [phone] between the hours of 8:30 a.m. and 4:30 p.m.  Voicemails left after 4:30 p.m. will not be returned until the following business day.  For prescription refill requests, have your pharmacy contact our office.       Resources For Cancer Patients and their Caregivers ? American Cancer Society: Can assist with transportation, wigs, general needs, runs Look Good Feel Better.        1-888-227-6333 ? Cancer Care: Provides financial assistance, online support groups, medication/co-pay assistance.  1-800-813-HOPE (4673) ? [facility] Assists [place] cancer patients and their families through  emotional , educational and financial support.  336-427-4357 ? [place] DSS Where to apply for food stamps, Medicaid and utility assistance. 336-342-1394 ? [facility]: Transportation to medical appointments. 336-347-2287 ? Social Security Administration: May apply for disability if have a Stage IV cancer. 336-342-7796 1-800-772-1213 ? [place] Aging, Disability and Transit Services: Assists with nutrition, care and transit needs. 336-349-2343  Cancer Center Support Programs: @10RELATIVEDAYS@ > Cancer Support Group  2nd Tuesday of the month 1pm-2pm, Journey Room  > Creative Journey  3rd Tuesday of the month 1130am-1pm, Journey Room  > Look Good Feel Better  1st Wednesday of the month 10am-12 noon, Journey Room (Call American Cancer Society to register 1-800-395-5775)    

## 2016-06-09 ENCOUNTER — Ambulatory Visit (HOSPITAL_COMMUNITY)
Admission: RE | Admit: 2016-06-09 | Discharge: 2016-06-09 | Disposition: A | Discharge status: Home / Self Care | Payer: Medicare Other | Source: Ambulatory Visit | Attending: Oncology | Admitting: Oncology

## 2016-06-09 ENCOUNTER — Other Ambulatory Visit (HOSPITAL_COMMUNITY): Payer: Self-pay | Admitting: Oncology

## 2016-06-09 DIAGNOSIS — C787 Secondary malignant neoplasm of liver and intrahepatic bile duct: Secondary | ICD-10-CM | POA: Insufficient documentation

## 2016-06-09 DIAGNOSIS — S2241XA Multiple fractures of ribs, right side, initial encounter for closed fracture: Secondary | ICD-10-CM | POA: Diagnosis not present

## 2016-06-09 DIAGNOSIS — C689 Malignant neoplasm of urinary organ, unspecified: Secondary | ICD-10-CM | POA: Insufficient documentation

## 2016-06-09 DIAGNOSIS — R918 Other nonspecific abnormal finding of lung field: Secondary | ICD-10-CM | POA: Diagnosis not present

## 2016-06-09 DIAGNOSIS — X58XXXA Exposure to other specified factors, initial encounter: Secondary | ICD-10-CM | POA: Diagnosis not present

## 2016-06-16 ENCOUNTER — Encounter (HOSPITAL_COMMUNITY): Payer: Medicare Other

## 2016-06-16 ENCOUNTER — Encounter (HOSPITAL_BASED_OUTPATIENT_CLINIC_OR_DEPARTMENT_OTHER): Payer: Medicare Other

## 2016-06-16 ENCOUNTER — Other Ambulatory Visit (HOSPITAL_COMMUNITY): Payer: Self-pay | Admitting: Oncology

## 2016-06-16 VITALS — BP 121/48 | HR 68 | Temp 97.8°F | Resp 20 | Wt 159.6 lb

## 2016-06-16 DIAGNOSIS — C689 Malignant neoplasm of urinary organ, unspecified: Secondary | ICD-10-CM

## 2016-06-16 DIAGNOSIS — C787 Secondary malignant neoplasm of liver and intrahepatic bile duct: Secondary | ICD-10-CM

## 2016-06-16 DIAGNOSIS — Z5112 Encounter for antineoplastic immunotherapy: Secondary | ICD-10-CM

## 2016-06-16 DIAGNOSIS — E876 Hypokalemia: Secondary | ICD-10-CM

## 2016-06-16 DIAGNOSIS — E039 Hypothyroidism, unspecified: Secondary | ICD-10-CM

## 2016-06-16 LAB — COMPREHENSIVE METABOLIC PANEL
ALT: 10 U/L — AB (ref 14–54)
AST: 25 U/L (ref 15–41)
Albumin: 2.7 g/dL — ABNORMAL LOW (ref 3.5–5.0)
Alkaline Phosphatase: 111 U/L (ref 38–126)
Anion gap: 9 (ref 5–15)
BUN: 23 mg/dL — AB (ref 6–20)
CHLORIDE: 98 mmol/L — AB (ref 101–111)
CO2: 27 mmol/L (ref 22–32)
CREATININE: 1.95 mg/dL — AB (ref 0.44–1.00)
Calcium: 8.9 mg/dL (ref 8.9–10.3)
GFR calc Af Amer: 26 mL/min — ABNORMAL LOW (ref >60)
GFR, EST NON AFRICAN AMERICAN: 23 mL/min — AB (ref >60)
Glucose, Bld: 260 mg/dL — ABNORMAL HIGH (ref 65–99)
POTASSIUM: 3.1 mmol/L — AB (ref 3.5–5.1)
SODIUM: 134 mmol/L — AB (ref 135–145)
Total Bilirubin: 0.6 mg/dL (ref 0.3–1.2)
Total Protein: 6 g/dL — ABNORMAL LOW (ref 6.5–8.1)

## 2016-06-16 LAB — CBC WITH DIFFERENTIAL/PLATELET
BASOS ABS: 0 10*3/uL (ref 0.0–0.1)
BASOS PCT: 1 %
EOS ABS: 0.3 10*3/uL (ref 0.0–0.7)
EOS PCT: 6 %
HCT: 31 % — ABNORMAL LOW (ref 36.0–46.0)
Hemoglobin: 10.5 g/dL — ABNORMAL LOW (ref 12.0–15.0)
Lymphocytes Relative: 14 %
Lymphs Abs: 0.7 10*3/uL (ref 0.7–4.0)
MCH: 31.9 pg (ref 26.0–34.0)
MCHC: 33.9 g/dL (ref 30.0–36.0)
MCV: 94.2 fL (ref 78.0–100.0)
MONO ABS: 0.6 10*3/uL (ref 0.1–1.0)
Monocytes Relative: 12 %
Neutro Abs: 3.1 10*3/uL (ref 1.7–7.7)
Neutrophils Relative %: 67 %
PLATELETS: 182 10*3/uL (ref 150–400)
RBC: 3.29 MIL/uL — AB (ref 3.87–5.11)
RDW: 15.1 % (ref 11.5–15.5)
WBC: 4.7 10*3/uL (ref 4.0–10.5)

## 2016-06-16 LAB — TSH: TSH: 0.305 u[IU]/mL — AB (ref 0.350–4.500)

## 2016-06-16 MED ORDER — POTASSIUM CHLORIDE ER 10 MEQ PO TBCR: 40.0000 meq | EXTENDED_RELEASE_TABLET | Freq: Every day | ORAL | 2 refills | Status: DC

## 2016-06-16 MED ORDER — POTASSIUM CHLORIDE ER 10 MEQ PO TBCR
60.0000 meq | EXTENDED_RELEASE_TABLET | Freq: Once | ORAL | Status: AC
  Administered 2016-06-16: 60 meq via ORAL
  Filled 2016-06-16: qty 6

## 2016-06-16 MED ORDER — SODIUM CHLORIDE 0.9 % IV SOLN
1200.0000 mg | Freq: Once | INTRAVENOUS | Status: AC
  Administered 2016-06-16: 1200 mg via INTRAVENOUS
  Filled 2016-06-16: qty 20

## 2016-06-16 MED ORDER — LEVOTHYROXINE SODIUM 75 MCG PO TABS: 75.0000 ug | ORAL_TABLET | Freq: Every day | ORAL | 1 refills | Status: DC

## 2016-06-16 MED ORDER — SODIUM CHLORIDE 0.9 % IV SOLN
Freq: Once | INTRAVENOUS | Status: AC
  Administered 2016-06-16: 12:00:00 via INTRAVENOUS

## 2016-06-16 NOTE — Patient Instructions (Signed)
Joann Hunter at [facility] Discharge Instructions  RECOMMENDATIONS MADE BY THE CONSULTANT AND ANY TEST RESULTS WILL BE SENT TO YOUR REFERRING PHYSICIAN.  Today you received Tecentriq. You will return in 21 days (July 5) for treatment.   A prescription for Potassium was called in to [facility] in [place]. Please take 4 tablets of potassium (kdur) daily.   Return to see Dr. Talbert Cage on 6/25 @ 1:20  Thank you for choosing [place] at [facility] to provide your oncology and hematology care.  To afford each patient quality time with our provider, please arrive at least 15 minutes before your scheduled appointment time.    If you have a lab appointment with the [facility] please come in thru the  Main Entrance and check in at the main information desk  You need to re-schedule your appointment should you arrive 10 or more minutes late.  We strive to give you quality time with our providers, and arriving late affects you and other patients whose appointments are after yours.  Also, if you no show three or more times for appointments you may be dismissed from the clinic at the providers discretion.     Again, thank you for choosing [facility].  Our hope is that these requests will decrease the amount of time that you wait before being seen by our physicians.       _____________________________________________________________  Should you have questions after your visit to [facility], please contact our office at [phone] between the hours of 8:30 a.m. and 4:30 p.m.  Voicemails left after 4:30 p.m. will not be returned until the following business day.  For prescription refill requests, have your pharmacy contact our office.       Resources For Cancer Patients and their Caregivers ? American Cancer Society: Can assist with transportation, wigs, general needs, runs Look Good Feel Better.         678-145-6007 ? Cancer Care: Provides financial assistance, online support groups, medication/co-pay assistance.  1-800-813-HOPE (438)572-1343) ? [employer] Assists [place] cancer patients and their families through emotional , educational and financial support.  847 809 3935 ? [place] DSS Where to apply for food stamps, Medicaid and utility assistance. 734-775-2243 ? [employer]: Transportation to medical appointments. 269-317-7810 ? Social Security Administration: May apply for disability if have a Stage IV cancer. 913-340-3211 (726)796-4547 ? [employer], Disability and Transit Services: Assists with nutrition, care and transit needs. [place] Support Programs: @10RELATIVEDAYS @ > Cancer Support Group  2nd Tuesday of the month 1pm-2pm, Journey Room  > Creative Journey  3rd Tuesday of the month 1130am-1pm, Journey Room  > Look Good Feel Better  1st Wednesday of the month 10am-12 noon, Journey Room (Call [facility] to register [phone])

## 2016-06-16 NOTE — Progress Notes (Signed)
Patient tolerated Tecentriq infusion without any problems. Pt stayed for additional hydration (normal saline 500cc) over 2 hours per Kirby Crigler verbal orders. Patient tolerated normal saline infusion without any problems. Patient discharged to home with daughter in stable condition. [facility] [place]. Patient instructed to start the potassium tomorrow since we gave her a dose of potassium in the clinic today. Daughter verbalized understanding of all instructions.

## 2016-06-22 ENCOUNTER — Encounter (HOSPITAL_COMMUNITY): Payer: Self-pay | Admitting: Emergency Medicine

## 2016-06-22 ENCOUNTER — Telehealth (HOSPITAL_COMMUNITY): Payer: Self-pay

## 2016-06-22 ENCOUNTER — Other Ambulatory Visit: Payer: Self-pay

## 2016-06-22 ENCOUNTER — Emergency Department (HOSPITAL_COMMUNITY): Payer: Medicare Other

## 2016-06-22 ENCOUNTER — Emergency Department (HOSPITAL_COMMUNITY)
Admission: EM | Admit: 2016-06-22 | Discharge: 2016-06-22 | Disposition: A | Discharge status: Home / Self Care | Payer: Medicare Other | Attending: Emergency Medicine | Admitting: Emergency Medicine

## 2016-06-22 DIAGNOSIS — Z7982 Long term (current) use of aspirin: Secondary | ICD-10-CM | POA: Insufficient documentation

## 2016-06-22 DIAGNOSIS — I129 Hypertensive chronic kidney disease with stage 1 through stage 4 chronic kidney disease, or unspecified chronic kidney disease: Secondary | ICD-10-CM | POA: Diagnosis not present

## 2016-06-22 DIAGNOSIS — Z85528 Personal history of other malignant neoplasm of kidney: Secondary | ICD-10-CM | POA: Diagnosis not present

## 2016-06-22 DIAGNOSIS — R531 Weakness: Secondary | ICD-10-CM

## 2016-06-22 DIAGNOSIS — E039 Hypothyroidism, unspecified: Secondary | ICD-10-CM | POA: Insufficient documentation

## 2016-06-22 DIAGNOSIS — N184 Chronic kidney disease, stage 4 (severe): Secondary | ICD-10-CM | POA: Diagnosis not present

## 2016-06-22 DIAGNOSIS — E1122 Type 2 diabetes mellitus with diabetic chronic kidney disease: Secondary | ICD-10-CM | POA: Diagnosis not present

## 2016-06-22 DIAGNOSIS — Z79899 Other long term (current) drug therapy: Secondary | ICD-10-CM | POA: Diagnosis not present

## 2016-06-22 DIAGNOSIS — N189 Chronic kidney disease, unspecified: Secondary | ICD-10-CM

## 2016-06-22 DIAGNOSIS — R42 Dizziness and giddiness: Secondary | ICD-10-CM | POA: Diagnosis present

## 2016-06-22 DIAGNOSIS — E875 Hyperkalemia: Secondary | ICD-10-CM

## 2016-06-22 DIAGNOSIS — I2510 Atherosclerotic heart disease of native coronary artery without angina pectoris: Secondary | ICD-10-CM | POA: Diagnosis not present

## 2016-06-22 LAB — COMPREHENSIVE METABOLIC PANEL
ALBUMIN: 2.7 g/dL — AB (ref 3.5–5.0)
ALT: 10 U/L — ABNORMAL LOW (ref 14–54)
ANION GAP: 8 (ref 5–15)
AST: 24 U/L (ref 15–41)
Alkaline Phosphatase: 110 U/L (ref 38–126)
BUN: 18 mg/dL (ref 6–20)
CO2: 24 mmol/L (ref 22–32)
Calcium: 9.4 mg/dL (ref 8.9–10.3)
Chloride: 99 mmol/L — ABNORMAL LOW (ref 101–111)
Creatinine, Ser: 1.88 mg/dL — ABNORMAL HIGH (ref 0.44–1.00)
GFR calc non Af Amer: 24 mL/min — ABNORMAL LOW (ref >60)
GFR, EST AFRICAN AMERICAN: 28 mL/min — AB (ref >60)
GLUCOSE: 120 mg/dL — AB (ref 65–99)
Potassium: 5.7 mmol/L — ABNORMAL HIGH (ref 3.5–5.1)
Sodium: 131 mmol/L — ABNORMAL LOW (ref 135–145)
Total Bilirubin: 0.6 mg/dL (ref 0.3–1.2)
Total Protein: 5.9 g/dL — ABNORMAL LOW (ref 6.5–8.1)

## 2016-06-22 LAB — URINALYSIS, ROUTINE W REFLEX MICROSCOPIC
BILIRUBIN URINE: NEGATIVE
GLUCOSE, UA: NEGATIVE mg/dL
HGB URINE DIPSTICK: NEGATIVE
KETONES UR: NEGATIVE mg/dL
NITRITE: NEGATIVE
PH: 6 (ref 5.0–8.0)
Protein, ur: NEGATIVE mg/dL
RBC / HPF: NONE SEEN RBC/hpf (ref 0–5)
Specific Gravity, Urine: 1.008 (ref 1.005–1.030)

## 2016-06-22 LAB — CBC WITH DIFFERENTIAL/PLATELET
BASOS ABS: 0 10*3/uL (ref 0.0–0.1)
BASOS PCT: 1 %
EOS PCT: 5 %
Eosinophils Absolute: 0.2 10*3/uL (ref 0.0–0.7)
HEMATOCRIT: 29.6 % — AB (ref 36.0–46.0)
Hemoglobin: 9.9 g/dL — ABNORMAL LOW (ref 12.0–15.0)
Lymphocytes Relative: 25 %
Lymphs Abs: 1 10*3/uL (ref 0.7–4.0)
MCH: 31.5 pg (ref 26.0–34.0)
MCHC: 33.4 g/dL (ref 30.0–36.0)
MCV: 94.3 fL (ref 78.0–100.0)
MONO ABS: 0.5 10*3/uL (ref 0.1–1.0)
Monocytes Relative: 13 %
NEUTROS ABS: 2.2 10*3/uL (ref 1.7–7.7)
Neutrophils Relative %: 56 %
PLATELETS: 169 10*3/uL (ref 150–400)
RBC: 3.14 MIL/uL — ABNORMAL LOW (ref 3.87–5.11)
RDW: 15 % (ref 11.5–15.5)
WBC: 4 10*3/uL (ref 4.0–10.5)

## 2016-06-22 LAB — TYPE AND SCREEN
ABO/RH(D): O NEG
Antibody Screen: NEGATIVE

## 2016-06-22 LAB — TROPONIN I: Troponin I: <0.03 ng/mL (ref <0.03)

## 2016-06-22 MED ORDER — SODIUM CHLORIDE 0.9 % IV BOLUS (SEPSIS)
500.0000 mL | Freq: Once | INTRAVENOUS | Status: AC
  Administered 2016-06-22: 500 mL via INTRAVENOUS

## 2016-06-22 MED ORDER — SODIUM CHLORIDE 0.9 % IV SOLN
1000.0000 mL | INTRAVENOUS | Status: DC
  Administered 2016-06-22: 1000 mL via INTRAVENOUS

## 2016-06-22 NOTE — ED Notes (Signed)
Pt placed on bedpan for appx 2 minutes, then removed, skin cleaned and dried.

## 2016-06-22 NOTE — Discharge Instructions (Signed)
Return to the ED as needed for worsening symptoms, follow up with your doctor to be rechecked, make sure to drink fluids and eat regularly.  Stop taking your potassium medication since your potassium level was higher than normal today

## 2016-06-22 NOTE — ED Triage Notes (Addendum)
Patient c/o dizziness that started this morning at 8am, upon waking. Per patient generalized weakness, headache, and nausea. Denies any facial drooping or slurred speech. Per patient took ibuprofen this morning which took her headache away. Patient is a cancer patient (kidney and liver). Per family patient got immune therapy and 1 liter of IVF last Thursday.

## 2016-06-22 NOTE — ED Provider Notes (Signed)
[facility] DEPT Provider Note   CSN: [id number] Arrival date & time: 06/22/16  1131     History   Chief Complaint Chief Complaint  Patient presents with  . Dizziness    HPI Joann Hunter is a 83 y.o. female.  HPI Patient presents to the emergency room for evaluation of generalized weakness. Patient has a history of kidney cancer per the medical records and family. She has been getting immunotherapy .  Her last infusion was on June 14. She is getting Tecentriq.   The patient has been having some trouble with intermittent weakness. Yesterday she was feeling weak however she was able to get up and go visit her husband at the nursing home. She felt better in the evening. This morning when the patient woke up she felt extremely weak again. He complained of some nausea according to the medical records old patient did not mention any nausea to me. She denies any trouble with any chest pain or shortness of breath. No vomiting or diarrhea. No fevers or chills. The weakness is generalized. Normally she uses a walker at home and a wheelchair to move around outside the house. They she was having trouble standing up. They called the cancer center and was instructed to come to the emergency room.  Past Medical History:  Diagnosis Date  . Arthritis   . Back pain, chronic   . CAD (coronary artery disease)    NSTEMI 04/2011 with newly diagnosed 3V CAD s/p PCI/DES mLAD 04/11/11 with residual dz (per Dr. Burt Knack, should have consideration of PCI of the occluded RCA based on symptoms and/or consideration of outpatient nuclear perfusion testing after the patient recovers from her infarct)  . Cancer ([place])    kidney ( Nov.2017)  . Chronic kidney disease (CKD), stage IV (severe) ([place]) 12/13/2013  . Hyperglycemia   . Hyperlipidemia   . Hypertension   . Hypothyroidism   . Ischemic cardiomyopathy    EF 45% by cath 04/20/11, 50-55% by echo 04/22/11. hypotension limiting medication titration   . Myocardial  infarction ([place]) 2013  . Type 2 diabetes with nephropathy ([place]) 07/26/2014  . UTI (urinary tract infection) 12/13/2013    Patient Active Problem List   Diagnosis Date Noted  . Hypokalemia 06/16/2016  . Hip fracture, unspecified laterality, closed, initial encounter ([place]) 04/28/2016  . Ischemic cardiomyopathy 04/28/2016  . Hyperlipidemia 04/28/2016  . Cancer ([place]) 04/28/2016  . Hip fracture ([place]) 04/28/2016  . Dehydration 04/14/2016  . Orthostasis 02/24/2016  . Acute encephalopathy 02/24/2016  . Confusion 02/21/2016  . Neutropenia ([place]) 02/20/2016  . Thrombocytopenia ([place]) 02/20/2016  . ESBL (extended spectrum beta-lactamase) producing bacteria infection 02/20/2016  . Goals of care, counseling/discussion 02/02/2016  . Urothelial cancer ([place]) 01/01/2016  . Rectal bleeding 11/05/2014  . Absolute anemia 11/05/2014  . Hypothyroidism 11/05/2014  . Metabolic encephalopathy 65/03/5463  . Type 2 diabetes with nephropathy ([place]) 07/26/2014  . Essential hypertension 07/26/2014  . Hyponatremia 07/25/2014  . Vertigo 07/25/2014  . AKI (acute kidney injury) ([place]) 12/13/2013  . Fall 12/13/2013  . Lower urinary tract infectious disease 12/13/2013  . Chronic kidney disease (CKD), stage IV (severe) ([place]) 12/13/2013  . Chest pain at rest 04/26/2011  . Syncope, near 04/26/2011  . CAD (coronary artery disease) 04/23/2011  . Cardiomyopathy, ischemic 04/23/2011  . Dyslipidemia 04/23/2011  . Myocardial infarction, anterior wall, initial care ([place]) 04/20/2011    Past Surgical History:  Procedure Laterality Date  . BACK SURGERY    . BREAST LUMPECTOMY  right  . CORONARY STENT PLACEMENT    . HIP ARTHROPLASTY Right 04/29/2016   Procedure: ARTHROPLASTY BIPOLAR HIP (HEMIARTHROPLASTY);  Surgeon: Carole Civil, MD;  Location: [facility];  Service: Orthopedics;  Laterality: Right;  . LEFT HEART CATHETERIZATION WITH CORONARY ANGIOGRAM N/A 04/20/2011   Procedure: LEFT HEART CATHETERIZATION WITH  CORONARY ANGIOGRAM;  Surgeon: Burnell Blanks, MD;  Location: [facility] CATH LAB;  Service: Cardiovascular;  Laterality: N/A;  . PERCUTANEOUS CORONARY STENT INTERVENTION (PCI-S) N/A 04/21/2011   Procedure: PERCUTANEOUS CORONARY STENT INTERVENTION (PCI-S);  Surgeon: Sherren Mocha, MD;  Location[facility] LAB;  Service: Cardiovascular;  Laterality: N/A;    OB History    Gravida Para Term Preterm AB Living   2 2 2     2    SAB TAB Ectopic Multiple Live Births                   Home Medications    Prior to Admission medications   Medication Sig Start Date End Date Taking? Authorizing Provider  amLODipine (NORVASC) 5 MG tablet Take 1 tablet (5 mg total) by mouth daily. 05/03/16  Yes Elgergawy, Silver Huguenin, MD  aspirin EC 325 MG EC tablet Take 1 tablet (325 mg total) by mouth daily with breakfast. Continue with 325 mg oral daily for total of 30 days for DVT prophylaxis, then go back to aspirin 81 mg oral daily 05/03/16  Yes Elgergawy, Silver Huguenin, MD  docusate sodium (COLACE) 100 MG capsule Take 1 capsule (100 mg total) by mouth 2 (two) times daily. Patient taking differently: Take 100 mg by mouth every other day.  11/06/14  Yes Kathie Dike, MD  feeding supplement, ENSURE ENLIVE, (ENSURE ENLIVE) LIQD Take 237 mLs by mouth 2 (two) times daily between meals. 05/02/16  Yes Elgergawy, Silver Huguenin, MD  fish oil-omega-3 fatty acids 1000 MG capsule Take 1 g by mouth daily.   Yes [provider]  furosemide (LASIX) 20 MG tablet Take 1 tablet (20 mg total) by mouth daily as needed for fluid or edema. 05/02/16  Yes Elgergawy, Silver Huguenin, MD  HYDROcodone-acetaminophen (NORCO/VICODIN) 5-325 MG tablet Take 1 tablet by mouth every 6 (six) hours as needed for moderate pain. 06/03/16  Yes Carole Civil, MD  levothyroxine (SYNTHROID) 75 MCG tablet Take 1 tablet (75 mcg total) by mouth daily before breakfast. 06/16/16  Yes Kefalas, Manon Hilding, PA-C  meclizine (ANTIVERT) 25 MG tablet Take 25 mg by mouth 4 (four) times  daily as needed. Dizziness   Yes [provider]  metoprolol tartrate (LOPRESSOR) 25 MG tablet Take 1 tablet (25 mg total) by mouth 2 (two) times daily. 05/02/16  Yes Elgergawy, Silver Huguenin, MD  nitroGLYCERIN (NITROSTAT) 0.4 MG SL tablet Place 0.4 mg under the tongue every 5 (five) minutes x 3 doses as needed. For chest pain. 04/23/11  Yes Dunn, Dayna N, PA-C  ondansetron (ZOFRAN) 8 MG tablet Take 1 tablet (8 mg total) by mouth every 8 (eight) hours as needed for nausea or vomiting. 01/29/16  Yes Penland, Kelby Fam, MD  pantoprazole (PROTONIX) 40 MG tablet Take 1 tablet (40 mg total) by mouth daily. 11/06/14  Yes Kathie Dike, MD  prochlorperazine (COMPAZINE) 10 MG tablet Take 1 tablet (10 mg total) by mouth every 6 (six) hours as needed for nausea or vomiting. 01/29/16  Yes Penland, Kelby Fam, MD  senna-docusate (SENOKOT-S) 8.6-50 MG tablet Take 1 tablet by mouth at bedtime as needed for mild constipation. 05/02/16  Yes Elgergawy, Silver Huguenin, MD  Vitamin D, Ergocalciferol, (DRISDOL) 50000 UNITS CAPS Take 50,000 Units by mouth 2 (two) times a week. Tuesdays and Thurdays 05/30/12  Yes [provider]    Family History Family History  Problem Relation Age of Onset  . Other Mother   . Cancer Father   . Other Unknown        no known family cardiac disease  . Other Brother        Diptheria  . Depression Sister   . Pneumonia Sister   . Other Brother        murdered    Social History Social History  Substance Use Topics  . Smoking status: Never Smoker  . Smokeless tobacco: Never Used  . Alcohol use No     Allergies   Ciprofloxacin; Lipitor [atorvastatin]; and Penicillins   Review of Systems Review of Systems  Constitutional: Negative for fever.  Respiratory: Negative for cough.   Cardiovascular: Negative for chest pain.  Gastrointestinal: Negative for abdominal pain and vomiting.  Genitourinary: Negative for dysuria.  Musculoskeletal: Positive for gait problem. Negative  for back pain.  Neurological: Negative for seizures, syncope and headaches.  Psychiatric/Behavioral: Negative for confusion.  All other systems reviewed and are negative.    Physical Exam Updated Vital Signs BP (!) 193/70   Pulse (!) 59   Temp 97.8 F (36.6 C) (Oral)   Resp 17   Ht 1.613 m (5' 3.5")   Wt 71.2 kg (157 lb)   SpO2 98%   BMI 27.38 kg/m   Physical Exam  Constitutional: No distress.  HENT:  Head: Normocephalic and atraumatic.  Right Ear: External ear normal.  Left Ear: External ear normal.  Mouth/Throat: No oropharyngeal exudate.  Eyes: Conjunctivae are normal. Right eye exhibits no discharge. Left eye exhibits no discharge. No scleral icterus.  Neck: Neck supple. No tracheal deviation present.  Cardiovascular: Normal rate, regular rhythm and intact distal pulses.   Pulmonary/Chest: Effort normal and breath sounds normal. No stridor. No respiratory distress. She has no wheezes. She has no rales.  osccsnl ronchi   Abdominal: Soft. Bowel sounds are normal. She exhibits no distension. There is no tenderness. There is no rebound and no guarding.  Musculoskeletal: She exhibits edema (mild edema in lower extremities). She exhibits no tenderness.  Neurological: She is alert. She has normal strength. No cranial nerve deficit (no facial droop, extraocular movements intact, no slurred speech) or sensory deficit. She exhibits normal muscle tone. She displays no seizure activity. Coordination normal.  Skin: Skin is warm and dry. No rash noted. She is not diaphoretic.  Psychiatric: She has a normal mood and affect.  Nursing note and vitals reviewed.    ED Treatments / Results  Labs (all labs ordered are listed, but only abnormal results are displayed) Labs Reviewed  COMPREHENSIVE METABOLIC PANEL - Abnormal; Notable for the following:       Result Value   Sodium 131 (*)    Potassium 5.7 (*)    Chloride 99 (*)    Glucose, Bld 120 (*)    Creatinine, Ser 1.88 (*)     Total Protein 5.9 (*)    Albumin 2.7 (*)    ALT 10 (*)    GFR calc non Af Amer 24 (*)    GFR calc Af Amer 28 (*)    All other components within normal limits  CBC WITH DIFFERENTIAL/PLATELET - Abnormal; Notable for the following:    RBC 3.14 (*)    Hemoglobin 9.9 (*)  HCT 29.6 (*)    All other components within normal limits  URINALYSIS, ROUTINE W REFLEX MICROSCOPIC - Abnormal; Notable for the following:    Color, Urine STRAW (*)    Leukocytes, UA TRACE (*)    Bacteria, UA RARE (*)    Squamous Epithelial / LPF 0-5 (*)    All other components within normal limits  TROPONIN I  TYPE AND SCREEN    EKG  EKG Interpretation  Date/Time:  Wednesday June 22 2016 11:39:13 EDT Ventricular Rate:  63 PR Interval:    QRS Duration: 90 QT Interval:  406 QTC Calculation: 416 R Axis:   -23 Text Interpretation:  Sinus rhythm Ventricular premature complex Probable left atrial enlargement Borderline left axis deviation Low voltage, precordial leads Consider anterior infarct t wave abnormality noted on prior ECG resolved Confirmed by Dorie Rank [phone]) on 06/22/2016 12:25:54 PM       Radiology Dg Chest 2 View  Result Date: 06/22/2016 CLINICAL DATA:  Generalize weakness associated with headache and nausea. Patient has a history of malignancy of the kidney and liver and is currently undergone immunotherapy. History of coronary artery disease with stent placement. EXAM: CHEST  2 VIEW COMPARISON:  CT scan chest of June 09, 2016 and chest x-ray of April 27, 2016 FINDINGS: The lungs are adequately inflated. There is no focal infiltrate. There is no pleural effusion. The heart is top-normal in size. The pulmonary vascularity is normal. There is calcification in the wall of the aortic arch. IMPRESSION: There is no acute pneumonia. Previously demonstrated pulmonary interstitial edema has resolved. Thoracic aortic atherosclerosis. Electronically Signed   By: David  Martinique M.D.   On: 06/22/2016 13:28     Procedures Procedures (including critical care time)  Medications Ordered in ED Medications  sodium chloride 0.9 % bolus 500 mL (0 mLs Intravenous Stopped 06/22/16 1354)    Followed by  0.9 %  sodium chloride infusion (1,000 mLs Intravenous New Bag/Given 06/22/16 1354)     Initial Impression / Assessment and Plan / ED Course  I have reviewed the triage vital signs and the nursing notes.  Pertinent labs & imaging results that were available during my care of the patient were reviewed by me and considered in my medical decision making (see chart for details).   patient presented with generalized weakness. No focal neurologic deficits.  Labs show a stable anemia and improving renal function although still elevated.  Pt does have an elevated potassium.  She was recently started on supplemental potassium.  Will have her stop that.    Pt improved with iv fluids.  Discussed findings with daughter.  Pt feels well enough to go home.  Final Clinical Impressions(s) / ED Diagnoses   Final diagnoses:  Weakness  Hyperkalemia  Chronic kidney disease, unspecified CKD stage    New Prescriptions Current Discharge Medication List       Dorie Rank, MD 06/22/16 [phone]

## 2016-06-22 NOTE — ED Notes (Signed)
EKG given to Dr. Goldston  

## 2016-06-22 NOTE — Telephone Encounter (Signed)
Patients daughter called stating her mom was complaining of being weak yesterday. However she got up and went to see her husband at the Nursing home last night and then had a chicken sandwich for supper. She told her daughter she was feeling better last night. This morning her daughter calls to check on patient and she states she is extremely weak and needs "to go somewhere". She is also nauseated. Reviewed with RN. Instructed daughter she could bring patient to have labs checked and if she needed fluids we could get her set up for Thursday or Friday. Or she could take patient to the ER. Daughter requested my direct line. She stated she did not know what she was going to do.

## 2016-06-22 NOTE — ED Notes (Signed)
Pt made aware to return if symptoms worsen or if any life threatening symptoms occur.   

## 2016-06-27 ENCOUNTER — Encounter (HOSPITAL_COMMUNITY): Payer: Self-pay

## 2016-06-27 ENCOUNTER — Encounter (HOSPITAL_BASED_OUTPATIENT_CLINIC_OR_DEPARTMENT_OTHER): Payer: Medicare Other | Admitting: Oncology

## 2016-06-27 VITALS — BP 110/40 | HR 55 | Temp 98.2°F | Resp 16 | Ht 62.0 in | Wt 160.0 lb

## 2016-06-27 DIAGNOSIS — C689 Malignant neoplasm of urinary organ, unspecified: Secondary | ICD-10-CM

## 2016-06-27 DIAGNOSIS — R42 Dizziness and giddiness: Secondary | ICD-10-CM | POA: Diagnosis not present

## 2016-06-27 DIAGNOSIS — C787 Secondary malignant neoplasm of liver and intrahepatic bile duct: Secondary | ICD-10-CM | POA: Diagnosis not present

## 2016-06-27 NOTE — Progress Notes (Signed)
Joann Hunter, [place] [id number]   CLINIC:  Medical Oncology/Hematology  PCP:  Monico Blitz, MD [address] [place] [address] [phone]   REASON FOR VISIT:  Follow-up for Stage IV Urothelial Carcinoma with liver mets  CURRENT THERAPY: Tecentriq IV every 21 days, beginning on 02/04/16   BRIEF ONCOLOGIC HISTORY:    Urothelial cancer ([place])   10/22/2015 Imaging    Presented with recurrent UTI and hematuria. Underwent cystoscopy and retrograde evaluation with Dr. Exie Parody revealed multiple filling defects in distal (R) ureter. Marked irregularly of proximal (R) ureter concerning for malignancy. (+) hydronephrosis noted and tumor noted out of ureteral orifice.        11/19/2015 Imaging    MRI abdomen: 3.2 cm mass in segment 4B of left hepatic lobe, suspicious for liver metastasis. Moderate right hydronephrosis and diffuse renal parenchymal atrophy. Abnormal signal intensity throughout right renal collecting system and proximal right ureter, consistent with known urothelial carcinoma. No other sites of abdominal metastatic disease identified.      12/07/2015 Procedure    Liver biopsy: Poorly differentiated carcinoma consistent with metastatic transitional cell histology.       01/01/2016 Initial Diagnosis    Urothelial carcinoma ([place])      02/04/2016 -  Chemotherapy    atezolizumab (TECENTRIQ) 1,200 mg IV every 21 days        06/09/2016 Imaging    CT C/A/P: Interval decrease in size of hepatic metastatic lesion.  Interval decrease in distention of the right renal collecting system and right ureter with central high attenuation material compatible with known primary urothelial malignancy.  Bilateral pulmonary nodules as above. Recommend attention on follow-up.  Subacute right-sided rib fractures.       HISTORY OF PRESENT ILLNESS (From Dr. Donald Pore note on 01/27/2016):  Joann Hunter 83 y.o. femaleis here because of referral from  Dr. Alen Blew for ureteral cancer. The patient lives closer to [facility] and felt it would be more convenient for her and her family to follow-up in [place].  Per Dr. Hazeline Junker note on 12/18/2015: "83 year old woman with history of hypertension, hyperlipidemia and coronary artery disease. She has been noted to have recurrent UTI and hematuria but subsequently underwent a cystoscopy and retrograde evaluation by Dr. Exie Parody in October 2017. Retrograde evaluation showed multiple filling defects in the distal right ureter. There is marked irregularity of the proximal right ureter specialist for malignant process. She also had an ultrasound which showed hydronephrosis and her cystoscopy on 10/22/2015 showed a tumor noted out of the ureteral orifice. MRI of the abdomen obtained on 11/19/2015 showed a 3.2 cm mass of the left hepatic lobe suspicious for liver metastasis. Moderate right hydronephrosis was also noted. No other sites of abdominal metastasis noted. She underwent a biopsy of her liver lesion on 12/07/2015 which showed metastatic carcinoma consistent with transitional cell histology. She was referred to me for evaluation regarding these findings. Clinically, she is asymptomatic from this. She is no longer reporting any hematuria, dysuria or abdominal pain. She does not report any flank pain. Her performance status is limited related to arthritis. She ambulates short distances inside her house. She relies on her daughter for most activities of daily living."      INTERVAL HISTORY:  Joann Hunter presents today for follow-up for Stage IV urothelial carcinoma.  She is seen in a wheelchair, accompanied by her daughter today.   Patient received cycle 4 Tecentriq on 06/16/16. He had a fatigue  after her treatment. She went to the ER a few days ago for dizziness and they found the patient was dehydrated and gave her IV fluids. Patient's daughter who accompanies her today states that she has been having  persistent dizziness. She takes one meclizine tablet a day when she feels dizzy. Today she was also noted to be hypotensive with a blood pressure of 110/40. She denies any chest pain, shortness breath, abdominal pain. She does not have any urinary symptoms. She ambulates at home with a walker.   REVIEW OF SYSTEMS:  Review of Systems  Constitutional: Negative for appetite change, chills, fatigue, fever and unexpected weight change.  HENT:  Negative.  Negative for mouth sores.   Eyes: Negative.   Respiratory: Negative.  Negative for cough and shortness of breath.   Cardiovascular: Negative for leg swelling.  Gastrointestinal: Negative for abdominal pain, blood in stool, constipation, diarrhea, nausea and vomiting.  Endocrine: Negative.   Genitourinary: Negative.  Negative for dysuria, hematuria and vaginal bleeding.   Musculoskeletal: Positive for arthralgias (chronic arthritis ). Negative for back pain.  Skin: Negative.  Negative for rash.  Neurological: Positive for dizziness. Negative for headaches.  Hematological: Does not bruise/bleed easily.  Psychiatric/Behavioral: Negative.  Negative for sleep disturbance.  All other systems reviewed and are negative.   PAST MEDICAL/SURGICAL HISTORY:  Past Medical History:  Diagnosis Date  . Arthritis   . Back pain, chronic   . CAD (coronary artery disease)    NSTEMI 04/2011 with newly diagnosed 3V CAD s/p PCI/DES mLAD 04/11/11 with residual dz (per Dr. Burt Knack, should have consideration of PCI of the occluded RCA based on symptoms and/or consideration of outpatient nuclear perfusion testing after the patient recovers from her infarct)  . Cancer ([place])    kidney ( Nov.2017)  . Chronic kidney disease (CKD), stage IV (severe) ([place]) 12/13/2013  . Hyperglycemia   . Hyperlipidemia   . Hypertension   . Hypothyroidism   . Ischemic cardiomyopathy    EF 45% by cath 04/20/11, 50-55% by echo 04/22/11. hypotension limiting medication titration   . Myocardial  infarction ([place]) 2013  . Type 2 diabetes with nephropathy ([place]) 07/26/2014  . UTI (urinary tract infection) 12/13/2013   Past Surgical History:  Procedure Laterality Date  . BACK SURGERY    . BREAST LUMPECTOMY     right  . CORONARY STENT PLACEMENT    . HIP ARTHROPLASTY Right 04/29/2016   Procedure: ARTHROPLASTY BIPOLAR HIP (HEMIARTHROPLASTY);  Surgeon: Carole Civil, MD;  Location: [facility];  Service: Orthopedics;  Laterality: Right;  . LEFT HEART CATHETERIZATION WITH CORONARY ANGIOGRAM N/A 04/20/2011   Procedure: LEFT HEART CATHETERIZATION WITH CORONARY ANGIOGRAM;  Surgeon: Burnell Blanks, MD;  Location: [facility] CATH LAB;  Service: Cardiovascular;  Laterality: N/A;  . PERCUTANEOUS CORONARY STENT INTERVENTION (PCI-S) N/A 04/21/2011   Procedure: PERCUTANEOUS CORONARY STENT INTERVENTION (PCI-S);  Surgeon: Sherren Mocha, MD;  Location: [facility] CATH LAB;  Service: Cardiovascular;  Laterality: N/A;     SOCIAL HISTORY:  Social History   Social History  . Marital status: Married    Spouse name: N/A  . Number of children: N/A  . Years of education: N/A   Occupational History  . Not on file.   Social History Main Topics  . Smoking status: Never Smoker  . Smokeless tobacco: Never Used  . Alcohol use No  . Drug use: No  . Sexual activity: No     Comment: married 61 years-husband at [employer]   Other Topics Concern  .  Not on file   Social History Narrative   Lives in [place], [place] with her husband and daughter.    FAMILY HISTORY:  Family History  Problem Relation Age of Onset  . Other Mother   . Cancer Father   . Other Unknown        no known family cardiac disease  . Other Brother        Diptheria  . Depression Sister   . Pneumonia Sister   . Other Brother        murdered    CURRENT MEDICATIONS:  Outpatient Encounter Prescriptions as of 06/27/2016  Medication Sig Note  . amLODipine (NORVASC) 5 MG tablet Take 1 tablet (5 mg total) by mouth daily.   Marland Kitchen aspirin EC 325 MG EC  tablet Take 1 tablet (325 mg total) by mouth daily with breakfast. Continue with 325 mg oral daily for total of 30 days for DVT prophylaxis, then go back to aspirin 81 mg oral daily   . docusate sodium (COLACE) 100 MG capsule Take 1 capsule (100 mg total) by mouth 2 (two) times daily. (Patient taking differently: Take 100 mg by mouth every other day. )   . feeding supplement, ENSURE ENLIVE, (ENSURE ENLIVE) LIQD Take 237 mLs by mouth 2 (two) times daily between meals.   . fish oil-omega-3 fatty acids 1000 MG capsule Take 1 g by mouth daily.   Marland Kitchen HYDROcodone-acetaminophen (NORCO/VICODIN) 5-325 MG tablet Take 1 tablet by mouth every 6 (six) hours as needed for moderate pain.   Marland Kitchen levothyroxine (SYNTHROID) 75 MCG tablet Take 1 tablet (75 mcg total) by mouth daily before breakfast.   . meclizine (ANTIVERT) 25 MG tablet Take 25 mg by mouth 4 (four) times daily as needed. Dizziness   . metoprolol tartrate (LOPRESSOR) 25 MG tablet Take 1 tablet (25 mg total) by mouth 2 (two) times daily.   . nitroGLYCERIN (NITROSTAT) 0.4 MG SL tablet Place 0.4 mg under the tongue every 5 (five) minutes x 3 doses as needed. For chest pain. 04/04/2014: Has not taken  . ondansetron (ZOFRAN) 8 MG tablet Take 1 tablet (8 mg total) by mouth every 8 (eight) hours as needed for nausea or vomiting.   . pantoprazole (PROTONIX) 40 MG tablet Take 1 tablet (40 mg total) by mouth daily.   . prochlorperazine (COMPAZINE) 10 MG tablet Take 1 tablet (10 mg total) by mouth every 6 (six) hours as needed for nausea or vomiting.   . senna-docusate (SENOKOT-S) 8.6-50 MG tablet Take 1 tablet by mouth at bedtime as needed for mild constipation.   . Vitamin D, Ergocalciferol, (DRISDOL) 50000 UNITS CAPS Take 50,000 Units by mouth 2 (two) times a week. Tuesdays and Thurdays   . [DISCONTINUED] furosemide (LASIX) 20 MG tablet Take 1 tablet (20 mg total) by mouth daily as needed for fluid or edema.    No facility-administered encounter medications on file as  of 06/27/2016.      ALLERGIES:  Allergies  Allergen Reactions  . Ciprofloxacin Other (See Comments)    Hallucination  . Lipitor [Atorvastatin] Other (See Comments)    Muscle Weakness  . Penicillins Rash    Has patient had a PCN reaction causing immediate rash, facial/tongue/throat swelling, SOB or lightheadedness with hypotension: unknown Has patient had a PCN reaction causing severe rash involving mucus membranes or skin necrosis: unknown Has patient had a PCN reaction that required hospitalization: unknown Has patient had a PCN reaction occurring within the last 10 years: unknown If all of the  above answers are "NO", then may proceed with Cephalosporin use. 2     PHYSICAL EXAM:  ECOG Performance status: 2 - Symptomatic; requires assistance   Vitals:   06/27/16 1329  BP: (!) 110/40  Pulse: (!) 55  Resp: 16  Temp: 98.2 F (36.8 C)   Filed Weights   06/27/16 1329  Weight: 160 lb (72.6 kg)    Physical Exam  Constitutional: She is oriented to person, place, and time and well-developed, well-nourished, and in no distress. No distress.  Exam done in wheelchair.   HENT:  Head: Normocephalic and atraumatic.  Mouth/Throat: Oropharynx is clear and moist. No oropharyngeal exudate.  She is mildly hard of hearing   Eyes: Conjunctivae are normal. Pupils are equal, round, and reactive to light. No scleral icterus.  Neck: Normal range of motion. Neck supple. No JVD present.  Cardiovascular: Normal rate, regular rhythm and normal heart sounds.  Exam reveals no gallop and no friction rub.   No murmur heard. Pulmonary/Chest: Effort normal and breath sounds normal. No respiratory distress. She has no wheezes. She has no rales.  Abdominal: Soft. Bowel sounds are normal. She exhibits no distension. There is no tenderness. There is no rebound and no guarding.  Musculoskeletal: Normal range of motion. She exhibits no edema or tenderness.  Lymphadenopathy:    She has no cervical  adenopathy.  Neurological: She is alert and oriented to person, place, and time. No cranial nerve deficit.  Skin: Skin is warm and dry. No rash noted. No erythema. No pallor.  Psychiatric: Mood, memory, affect and judgment normal.  Nursing note and vitals reviewed.   LABORATORY DATA:  I have reviewed the labs as listed.  CBC    Component Value Date/Time   WBC 4.0 06/22/2016 1310   RBC 3.14 (L) 06/22/2016 1310   HGB 9.9 (L) 06/22/2016 1310   HCT 29.6 (L) 06/22/2016 1310   PLT 169 06/22/2016 1310   MCV 94.3 06/22/2016 1310   MCH 31.5 06/22/2016 1310   MCHC 33.4 06/22/2016 1310   RDW 15.0 06/22/2016 1310   LYMPHSABS 1.0 06/22/2016 1310   MONOABS 0.5 06/22/2016 1310   EOSABS 0.2 06/22/2016 1310   BASOSABS 0.0 06/22/2016 1310   CMP Latest Ref Rng & Units 06/22/2016 06/16/2016 05/01/2016  Glucose 65 - 99 mg/dL 120(H) 260(H) 146(H)  BUN 6 - 20 mg/dL 18 23(H) 18  Creatinine 0.44 - 1.00 mg/dL 1.88(H) 1.95(H) 1.42(H)  Sodium 135 - 145 mmol/L 131(L) 134(L) 132(L)  Potassium 3.5 - 5.1 mmol/L 5.7(H) 3.1(L) 3.6  Chloride 101 - 111 mmol/L 99(L) 98(L) 98(L)  CO2 22 - 32 mmol/L 24 27 27   Calcium 8.9 - 10.3 mg/dL 9.4 8.9 8.0(L)  Total Protein 6.5 - 8.1 g/dL 5.9(L) 6.0(L) -  Total Bilirubin 0.3 - 1.2 mg/dL 0.6 0.6 -  Alkaline Phos 38 - 126 U/L 110 111 -  AST 15 - 41 U/L 24 25 -  ALT 14 - 54 U/L 10(L) 10(L) -    PENDING LABS:    DIAGNOSTIC IMAGING:  MRI abdomen: 11/19/15     PATHOLOGY:  Liver biopsy: 12/07/15       ASSESSMENT & PLAN:   Stage IV Urothelial carcinoma, multifocal R ureteral masses with liver metastases: -s/p 4 cycles of Tecentriq thus far. Proceed with cycle 5 as scheduled on 07/07/16. -I have reviewed her restaging CT chest/abdomen/pelvis which demonstrated that she has had a good response to treatment with decrease in her liver metastasis as well as improvement in her primary  malignancy in the urothelial system. Continue Tecentriq at this time.   -I have  discussed with the patient that she is still quite frail and given her elderly age, if she has many side effects with tecentriq in any of her future treatments , then I would recommend to stop all therapy and change focus to comfort measures.   Dizziness -Maybe multifactorial. -She was hypotensive today especially with a low diastolic pressure. I have advised patient's daughter to hold her blood pressure medication if her systolic is less than 628 and diastolic blood pressures less than 80. Her daughter states that she will get a blood pressure monitor in order to monitor her blood pressures at home. -Recommended for her to increase her meclizine 2 by mouth 3 times a day. -May hold her narcotics which she takes for her hip pain for a few days and take anti-inflammatories and status such as Advil, and see if her dizziness improves.  Return to clinic in 4 weeks for follow-up.       Orders placed this encounter:  No orders of the defined types were placed in this encounter.   Twana First, MD

## 2016-07-07 ENCOUNTER — Encounter (HOSPITAL_COMMUNITY): Payer: Medicare Other

## 2016-07-07 ENCOUNTER — Encounter (HOSPITAL_COMMUNITY): Payer: Self-pay

## 2016-07-07 ENCOUNTER — Other Ambulatory Visit (HOSPITAL_COMMUNITY): Payer: Self-pay | Admitting: Adult Health

## 2016-07-07 ENCOUNTER — Encounter (HOSPITAL_COMMUNITY): Payer: Medicare Other | Attending: Hematology & Oncology

## 2016-07-07 VITALS — BP 148/47 | HR 55 | Temp 98.2°F | Resp 18 | Wt 159.8 lb

## 2016-07-07 DIAGNOSIS — R300 Dysuria: Secondary | ICD-10-CM

## 2016-07-07 DIAGNOSIS — C689 Malignant neoplasm of urinary organ, unspecified: Secondary | ICD-10-CM

## 2016-07-07 DIAGNOSIS — C787 Secondary malignant neoplasm of liver and intrahepatic bile duct: Secondary | ICD-10-CM

## 2016-07-07 DIAGNOSIS — Z5112 Encounter for antineoplastic immunotherapy: Secondary | ICD-10-CM | POA: Diagnosis not present

## 2016-07-07 DIAGNOSIS — N390 Urinary tract infection, site not specified: Secondary | ICD-10-CM

## 2016-07-07 DIAGNOSIS — R350 Frequency of micturition: Secondary | ICD-10-CM

## 2016-07-07 LAB — URINALYSIS, ROUTINE W REFLEX MICROSCOPIC
BILIRUBIN URINE: NEGATIVE
GLUCOSE, UA: NEGATIVE mg/dL
KETONES UR: NEGATIVE mg/dL
NITRITE: POSITIVE — AB
PH: 6 (ref 5.0–8.0)
Protein, ur: 30 mg/dL — AB
Specific Gravity, Urine: 1.013 (ref 1.005–1.030)

## 2016-07-07 LAB — COMPREHENSIVE METABOLIC PANEL
ALT: 11 U/L — ABNORMAL LOW (ref 14–54)
ANION GAP: 9 (ref 5–15)
AST: 22 U/L (ref 15–41)
Albumin: 3.1 g/dL — ABNORMAL LOW (ref 3.5–5.0)
Alkaline Phosphatase: 104 U/L (ref 38–126)
BILIRUBIN TOTAL: 0.8 mg/dL (ref 0.3–1.2)
BUN: 24 mg/dL — ABNORMAL HIGH (ref 6–20)
CHLORIDE: 97 mmol/L — AB (ref 101–111)
CO2: 25 mmol/L (ref 22–32)
Calcium: 9.2 mg/dL (ref 8.9–10.3)
Creatinine, Ser: 1.81 mg/dL — ABNORMAL HIGH (ref 0.44–1.00)
GFR, EST AFRICAN AMERICAN: 29 mL/min — AB (ref >60)
GFR, EST NON AFRICAN AMERICAN: 25 mL/min — AB (ref >60)
Glucose, Bld: 228 mg/dL — ABNORMAL HIGH (ref 65–99)
Potassium: 4.3 mmol/L (ref 3.5–5.1)
Sodium: 131 mmol/L — ABNORMAL LOW (ref 135–145)
TOTAL PROTEIN: 6.7 g/dL (ref 6.5–8.1)

## 2016-07-07 LAB — CBC WITH DIFFERENTIAL/PLATELET
Basophils Absolute: 0 10*3/uL (ref 0.0–0.1)
Basophils Relative: 0 %
EOS PCT: 3 %
Eosinophils Absolute: 0.2 10*3/uL (ref 0.0–0.7)
HEMATOCRIT: 31.9 % — AB (ref 36.0–46.0)
Hemoglobin: 10.7 g/dL — ABNORMAL LOW (ref 12.0–15.0)
LYMPHS PCT: 13 %
Lymphs Abs: 0.9 10*3/uL (ref 0.7–4.0)
MCH: 31.5 pg (ref 26.0–34.0)
MCHC: 33.5 g/dL (ref 30.0–36.0)
MCV: 93.8 fL (ref 78.0–100.0)
MONO ABS: 0.6 10*3/uL (ref 0.1–1.0)
MONOS PCT: 9 %
Neutro Abs: 5.3 10*3/uL (ref 1.7–7.7)
Neutrophils Relative %: 75 %
PLATELETS: 208 10*3/uL (ref 150–400)
RBC: 3.4 MIL/uL — ABNORMAL LOW (ref 3.87–5.11)
RDW: 14.8 % (ref 11.5–15.5)
WBC: 7 10*3/uL (ref 4.0–10.5)

## 2016-07-07 MED ORDER — SULFAMETHOXAZOLE-TRIMETHOPRIM 800-160 MG PO TABS: 0.5000 | ORAL_TABLET | Freq: Two times a day (BID) | ORAL | 0 refills | Status: DC

## 2016-07-07 MED ORDER — SODIUM CHLORIDE 0.9 % IV SOLN
1200.0000 mg | Freq: Once | INTRAVENOUS | Status: AC
  Administered 2016-07-07: 1200 mg via INTRAVENOUS
  Filled 2016-07-07: qty 20

## 2016-07-07 MED ORDER — SODIUM CHLORIDE 0.9 % IV SOLN
Freq: Once | INTRAVENOUS | Status: AC
  Administered 2016-07-07: 11:00:00 via INTRAVENOUS

## 2016-07-07 NOTE — Progress Notes (Signed)
Joann Hunter tolerated Tecentriq infusion well without complaints or incident. Labs reviewed with Dr. Talbert Cage prior to administering this medication. Pt and daughter did report frequent urination with only small amounts in the last few days. Spoke with Joann Craze NP about this and orders obtained for U/A and urine culture which was collected prior to pt's discharge. VSS upon discharge. Pt discharged via wheelchair in stable condition accompanied by her daughter

## 2016-07-07 NOTE — Patient Instructions (Signed)
[facility] Discharge Instructions for Patients Receiving Chemotherapy   Beginning January 23rd 2017 lab work for the Cancer Center will be done in the  Main lab at [facility] on 1st floor. If you have a lab appointment with the Cancer Center please come in thru the  Main Entrance and check in at the main information desk   Today you received the following chemotherapy agents Tecentriq. Follow-up as scheduled. Call clinic for any questions or concerns  To help prevent nausea and vomiting after your treatment, we encourage you to take your nausea medication   If you develop nausea and vomiting, or diarrhea that is not controlled by your medication, call the clinic.  The clinic phone number is [phone]. Office hours are Monday-Friday 8:30am-5:00pm.  BELOW ARE SYMPTOMS THAT SHOULD BE REPORTED IMMEDIATELY:  *FEVER GREATER THAN 101.0 F  *CHILLS WITH OR WITHOUT FEVER  NAUSEA AND VOMITING THAT IS NOT CONTROLLED WITH YOUR NAUSEA MEDICATION  *UNUSUAL SHORTNESS OF BREATH  *UNUSUAL BRUISING OR BLEEDING  TENDERNESS IN MOUTH AND THROAT WITH OR WITHOUT PRESENCE OF ULCERS  *URINARY PROBLEMS  *BOWEL PROBLEMS  UNUSUAL RASH Items with * indicate a potential emergency and should be followed up as soon as possible. If you have an emergency after office hours please contact your primary care physician or go to the nearest emergency department.  Please call the clinic during office hours if you have any questions or concerns.   You may also contact the Patient Navigator at [phone] should you have any questions or need assistance in obtaining follow up care.      Resources For Cancer Patients and their Caregivers ? American Cancer Society: Can assist with transportation, wigs, general needs, runs Look Good Feel Better.        1-888-227-6333 ? Cancer Care: Provides financial assistance, online support groups, medication/co-pay assistance.  1-800-813-HOPE  (4673) ? [facility] Assists [place] cancer patients and their families through emotional , educational and financial support.  336-427-4357 ? [place] DSS Where to apply for food stamps, Medicaid and utility assistance. 336-342-1394 ? [facility]: Transportation to medical appointments. 336-347-2287 ? Social Security Administration: May apply for disability if have a Stage IV cancer. 336-342-7796 1-800-772-1213 ? [place] Aging, Disability and Transit Services: Assists with nutrition, care and transit needs. 336-349-2343         

## 2016-07-10 LAB — URINE CULTURE

## 2016-07-28 ENCOUNTER — Encounter (HOSPITAL_BASED_OUTPATIENT_CLINIC_OR_DEPARTMENT_OTHER): Payer: Medicare Other

## 2016-07-28 ENCOUNTER — Encounter (HOSPITAL_BASED_OUTPATIENT_CLINIC_OR_DEPARTMENT_OTHER): Payer: Medicare Other | Admitting: Oncology

## 2016-07-28 ENCOUNTER — Encounter (HOSPITAL_COMMUNITY): Payer: Self-pay

## 2016-07-28 ENCOUNTER — Other Ambulatory Visit (HOSPITAL_COMMUNITY): Payer: Self-pay

## 2016-07-28 ENCOUNTER — Encounter (HOSPITAL_COMMUNITY): Payer: Medicare Other

## 2016-07-28 VITALS — BP 143/36 | HR 51 | Temp 97.5°F | Resp 16 | Wt 156.7 lb

## 2016-07-28 VITALS — BP 138/43 | HR 60 | Temp 97.6°F | Resp 18

## 2016-07-28 DIAGNOSIS — C689 Malignant neoplasm of urinary organ, unspecified: Secondary | ICD-10-CM

## 2016-07-28 DIAGNOSIS — C787 Secondary malignant neoplasm of liver and intrahepatic bile duct: Secondary | ICD-10-CM | POA: Diagnosis not present

## 2016-07-28 DIAGNOSIS — Z5112 Encounter for antineoplastic immunotherapy: Secondary | ICD-10-CM | POA: Diagnosis not present

## 2016-07-28 DIAGNOSIS — N184 Chronic kidney disease, stage 4 (severe): Secondary | ICD-10-CM | POA: Diagnosis not present

## 2016-07-28 DIAGNOSIS — E039 Hypothyroidism, unspecified: Secondary | ICD-10-CM

## 2016-07-28 LAB — URINALYSIS, DIPSTICK ONLY
BILIRUBIN URINE: NEGATIVE
Glucose, UA: NEGATIVE mg/dL
Hgb urine dipstick: NEGATIVE
Ketones, ur: NEGATIVE mg/dL
NITRITE: POSITIVE — AB
Protein, ur: NEGATIVE mg/dL
SPECIFIC GRAVITY, URINE: 1.02 (ref 1.005–1.030)
pH: 5.5 (ref 5.0–8.0)

## 2016-07-28 LAB — COMPREHENSIVE METABOLIC PANEL
ALK PHOS: 123 U/L (ref 38–126)
ALT: 40 U/L (ref 14–54)
AST: 31 U/L (ref 15–41)
Albumin: 2.9 g/dL — ABNORMAL LOW (ref 3.5–5.0)
Anion gap: 6 (ref 5–15)
BILIRUBIN TOTAL: 0.5 mg/dL (ref 0.3–1.2)
BUN: 29 mg/dL — AB (ref 6–20)
CALCIUM: 8.7 mg/dL — AB (ref 8.9–10.3)
CO2: 25 mmol/L (ref 22–32)
CREATININE: 1.76 mg/dL — AB (ref 0.44–1.00)
Chloride: 103 mmol/L (ref 101–111)
GFR, EST AFRICAN AMERICAN: 30 mL/min — AB (ref >60)
GFR, EST NON AFRICAN AMERICAN: 26 mL/min — AB (ref >60)
Glucose, Bld: 233 mg/dL — ABNORMAL HIGH (ref 65–99)
Potassium: 3.8 mmol/L (ref 3.5–5.1)
Sodium: 134 mmol/L — ABNORMAL LOW (ref 135–145)
Total Protein: 6.1 g/dL — ABNORMAL LOW (ref 6.5–8.1)

## 2016-07-28 LAB — CBC WITH DIFFERENTIAL/PLATELET
Basophils Absolute: 0 10*3/uL (ref 0.0–0.1)
Basophils Relative: 0 %
EOS PCT: 3 %
Eosinophils Absolute: 0.2 10*3/uL (ref 0.0–0.7)
HCT: 32.5 % — ABNORMAL LOW (ref 36.0–46.0)
HEMOGLOBIN: 10.7 g/dL — AB (ref 12.0–15.0)
LYMPHS ABS: 1.4 10*3/uL (ref 0.7–4.0)
LYMPHS PCT: 27 %
MCH: 31.7 pg (ref 26.0–34.0)
MCHC: 32.9 g/dL (ref 30.0–36.0)
MCV: 96.2 fL (ref 78.0–100.0)
Monocytes Absolute: 0.4 10*3/uL (ref 0.1–1.0)
Monocytes Relative: 8 %
NEUTROS ABS: 3.2 10*3/uL (ref 1.7–7.7)
NEUTROS PCT: 62 %
Platelets: 174 10*3/uL (ref 150–400)
RBC: 3.38 MIL/uL — AB (ref 3.87–5.11)
RDW: 14.5 % (ref 11.5–15.5)
WBC: 5.2 10*3/uL (ref 4.0–10.5)

## 2016-07-28 LAB — TSH: TSH: 10.377 u[IU]/mL — AB (ref 0.350–4.500)

## 2016-07-28 MED ORDER — LEVOTHYROXINE SODIUM 112 MCG PO TABS: 112.0000 ug | ORAL_TABLET | Freq: Every day | ORAL | 1 refills | Status: DC

## 2016-07-28 MED ORDER — HEPARIN SOD (PORK) LOCK FLUSH 100 UNIT/ML IV SOLN: 500.0000 [IU] | Freq: Once | INTRAVENOUS | Status: DC | PRN

## 2016-07-28 MED ORDER — SODIUM CHLORIDE 0.9 % IV SOLN
Freq: Once | INTRAVENOUS | Status: AC
  Administered 2016-07-28: 12:00:00 via INTRAVENOUS

## 2016-07-28 MED ORDER — SODIUM CHLORIDE 0.9 % IV SOLN
1200.0000 mg | Freq: Once | INTRAVENOUS | Status: AC
  Administered 2016-07-28: 1200 mg via INTRAVENOUS
  Filled 2016-07-28: qty 20

## 2016-07-28 MED ORDER — SODIUM CHLORIDE 0.9% FLUSH: 10.0000 mL | INTRAVENOUS | Status: DC | PRN

## 2016-07-28 NOTE — Progress Notes (Signed)
Chemotherapy given today per orders. Patient tolerated it well without problems. Vitals stable and discharged home via wheelchair with daughter. Follow up as scheduled.

## 2016-07-28 NOTE — Patient Instructions (Signed)
[facility] at [facility] Discharge Instructions  RECOMMENDATIONS MADE BY THE CONSULTANT AND ANY TEST RESULTS WILL BE SENT TO YOUR REFERRING PHYSICIAN.  You saw Dr. Zhou today.  Thank you for choosing [facility] at [facility] to provide your oncology and hematology care.  To afford each patient quality time with our provider, please arrive at least 15 minutes before your scheduled appointment time.    If you have a lab appointment with the Cancer Center please come in thru the  Main Entrance and check in at the main information desk  You need to re-schedule your appointment should you arrive 10 or more minutes late.  We strive to give you quality time with our providers, and arriving late affects you and other patients whose appointments are after yours.  Also, if you no show three or more times for appointments you may be dismissed from the clinic at the providers discretion.     Again, thank you for choosing [facility].  Our hope is that these requests will decrease the amount of time that you wait before being seen by our physicians.       _____________________________________________________________  Should you have questions after your visit to [facility], please contact our office at [phone] between the hours of 8:30 a.m. and 4:30 p.m.  Voicemails left after 4:30 p.m. will not be returned until the following business day.  For prescription refill requests, have your pharmacy contact our office.       Resources For Cancer Patients and their Caregivers ? American Cancer Society: Can assist with transportation, wigs, general needs, runs Look Good Feel Better.        1-888-227-6333 ? Cancer Care: Provides financial assistance, online support groups, medication/co-pay assistance.  1-800-813-HOPE (4673) ? [facility] Assists [place] cancer patients and their families through  emotional , educational and financial support.  336-427-4357 ? [place] DSS Where to apply for food stamps, Medicaid and utility assistance. 336-342-1394 ? [facility]: Transportation to medical appointments. 336-347-2287 ? Social Security Administration: May apply for disability if have a Stage IV cancer. 336-342-7796 1-800-772-1213 ? [place] Aging, Disability and Transit Services: Assists with nutrition, care and transit needs. 336-349-2343  Cancer Center Support Programs: @[email or web address]@ > Cancer Support Group  2nd Tuesday of the month 1pm-2pm, Journey Room  > Creative Journey  3rd Tuesday of the month 1130am-1pm, Journey Room  > Look Good Feel Better  1st Wednesday of the month 10am-12 noon, Journey Room (Call American Cancer Society to register 1-800-395-5775)    

## 2016-07-28 NOTE — Progress Notes (Signed)
[place],  [id number]   CLINIC:  Medical Oncology/Hematology  PCP:  Monico Blitz, MD [address] [place] [address] [phone]   REASON FOR VISIT:  Follow-up for Stage IV Urothelial Carcinoma with liver mets  CURRENT THERAPY: Tecentriq IV every 21 days, beginning on 02/04/16   BRIEF ONCOLOGIC HISTORY:    Urothelial cancer (Joann Hunter)   10/22/2015 Imaging    Presented with recurrent UTI and hematuria. Underwent cystoscopy and retrograde evaluation with Dr. Exie Parody revealed multiple filling defects in distal (R) ureter. Marked irregularly of proximal (R) ureter concerning for malignancy. (+) hydronephrosis noted and tumor noted out of ureteral orifice.        11/19/2015 Imaging    MRI abdomen: 3.2 cm mass in segment 4B of left hepatic lobe, suspicious for liver metastasis. Moderate right hydronephrosis and diffuse renal parenchymal atrophy. Abnormal signal intensity throughout right renal collecting system and proximal right ureter, consistent with known urothelial carcinoma. No other sites of abdominal metastatic disease identified.      12/07/2015 Procedure    Liver biopsy: Poorly differentiated carcinoma consistent with metastatic transitional cell histology.       01/01/2016 Initial Diagnosis    Urothelial carcinoma (Joann Hunter)      02/04/2016 -  Chemotherapy    atezolizumab (TECENTRIQ) 1,200 mg IV every 21 days        06/09/2016 Imaging    CT C/A/P: Interval decrease in size of hepatic metastatic lesion.  Interval decrease in distention of the right renal collecting system and right ureter with central high attenuation material compatible with known primary urothelial malignancy.  Bilateral pulmonary nodules as above. Recommend attention on follow-up.  Subacute right-sided rib fractures.       HISTORY OF PRESENT ILLNESS (From Dr. Donald Pore note on 01/27/2016):  Joann Hunter 83 y.o. femaleis here because of referral from  Dr. Alen Hunter for ureteral cancer. The patient lives closer to [facility] and felt it would be more convenient for her and her family to follow-up in [place].  Per Dr. Hazeline Hunter note on 12/18/2015: "83 year old woman with history of hypertension, hyperlipidemia and coronary artery disease. She has been noted to have recurrent UTI and hematuria but subsequently underwent a cystoscopy and retrograde evaluation by Dr. Exie Parody in October 2017. Retrograde evaluation showed multiple filling defects in the distal right ureter. There is marked irregularity of the proximal right ureter specialist for malignant process. She also had an ultrasound which showed hydronephrosis and her cystoscopy on 10/22/2015 showed a tumor noted out of the ureteral orifice. MRI of the abdomen obtained on 11/19/2015 showed a 3.2 cm mass of the left hepatic lobe suspicious for liver metastasis. Moderate right hydronephrosis was also noted. No other sites of abdominal metastasis noted. She underwent a biopsy of her liver lesion on 12/07/2015 which showed metastatic carcinoma consistent with transitional cell histology. She was referred to me for evaluation regarding these findings. Clinically, she is asymptomatic from this. She is no longer reporting any hematuria, dysuria or abdominal pain. She does not report any flank pain. Her performance status is limited related to arthritis. She ambulates short distances inside her house. She relies on her daughter for most activities of daily living."      INTERVAL HISTORY:  Ms. Joann Hunter presents today for follow-up for Stage IV urothelial carcinoma.  She is seen in a wheelchair, accompanied by her daughter today.   She is here for cycle 6 of tecentriq. She has been  tolerating tecentriq well without any side effects. Her daughter states that she has been more active and getting out more. She denies any chest pain, shortness of breath, abdominal pain, recent infections, diarrhea. She has  intermittent pain in her right hip where she had her hip replacement. Her dizziness has resolved; it was due to hypotension from her BP meds.   REVIEW OF SYSTEMS:  Review of Systems  Constitutional: Negative for appetite change, chills, fatigue, fever and unexpected weight change.  HENT:  Negative.  Negative for mouth sores.   Eyes: Negative.   Respiratory: Negative.  Negative for cough and shortness of breath.   Cardiovascular: Negative for leg swelling.  Gastrointestinal: Negative for abdominal pain, blood in stool, constipation, diarrhea, nausea and vomiting.  Endocrine: Negative.   Genitourinary: Negative.  Negative for dysuria, hematuria and vaginal bleeding.   Musculoskeletal: Negative for back pain. Arthralgias: chronic arthritis   Skin: Negative.  Negative for rash.  Neurological: Negative for dizziness and headaches.  Hematological: Does not bruise/bleed easily.  Psychiatric/Behavioral: Negative.  Negative for sleep disturbance.  All other systems reviewed and are negative.   PAST MEDICAL/SURGICAL HISTORY:  Past Medical History:  Diagnosis Date  . Arthritis   . Back pain, chronic   . CAD (coronary artery disease)    NSTEMI 04/2011 with newly diagnosed 3V CAD s/p PCI/DES mLAD 04/11/11 with residual dz (per Dr. Burt Knack, should have consideration of PCI of the occluded RCA based on symptoms and/or consideration of outpatient nuclear perfusion testing after the patient recovers from her infarct)  . Cancer ([facility])    kidney ( Nov.2017)  . Chronic kidney disease (CKD), stage IV (severe) ([place]) 12/13/2013  . Hyperglycemia   . Hyperlipidemia   . Hypertension   . Hypothyroidism   . Ischemic cardiomyopathy    EF 45% by cath 04/20/11, 50-55% by echo 04/22/11. hypotension limiting medication titration   . Myocardial infarction (Joann Hunter) 2013  . Type 2 diabetes with nephropathy (Joann Hunter) 07/26/2014  . UTI (urinary tract infection) 12/13/2013   Past Surgical History:  Procedure Laterality Date  .  BACK SURGERY    . BREAST LUMPECTOMY     right  . CORONARY STENT PLACEMENT    . HIP ARTHROPLASTY Right 04/29/2016   Procedure: ARTHROPLASTY BIPOLAR HIP (HEMIARTHROPLASTY);  Surgeon: Carole Civil, MD;  Location: [facility];  Service: Orthopedics;  Laterality: Right;  . LEFT HEART CATHETERIZATION WITH CORONARY ANGIOGRAM N/A 04/20/2011   Procedure: LEFT HEART CATHETERIZATION WITH CORONARY ANGIOGRAM;  Surgeon: Burnell Blanks, MD;  Location: [facility] CATH LAB;  Service: Cardiovascular;  Laterality: N/A;  . PERCUTANEOUS CORONARY STENT INTERVENTION (PCI-S) N/A 04/21/2011   Procedure: PERCUTANEOUS CORONARY STENT INTERVENTION (PCI-S);  Surgeon: Sherren Mocha, MD;  Location: [facility] CATH LAB;  Service: Cardiovascular;  Laterality: N/A;     SOCIAL HISTORY:  Social History   Social History  . Marital status: Married    Spouse name: N/A  . Number of children: N/A  . Years of education: N/A   Occupational History  . Not on file.   Social History Main Topics  . Smoking status: Never Smoker  . Smokeless tobacco: Never Used  . Alcohol use No  . Drug use: No  . Sexual activity: No     Comment: married 61 years-husband at [employer]   Other Topics Concern  . Not on file   Social History Narrative   Lives in [place], [place] with her husband and daughter.    FAMILY HISTORY:  Family History  Problem Relation  Age of Onset  . Other Mother   . Cancer Father   . Other Unknown        no known family cardiac disease  . Other Brother        Diptheria  . Depression Sister   . Pneumonia Sister   . Other Brother        murdered    CURRENT MEDICATIONS:  Outpatient Encounter Prescriptions as of 07/28/2016  Medication Sig Note  . amLODipine (NORVASC) 5 MG tablet Take 1 tablet (5 mg total) by mouth daily.   Marland Kitchen aspirin EC 325 MG EC tablet Take 1 tablet (325 mg total) by mouth daily with breakfast. Continue with 325 mg oral daily for total of 30 days for DVT prophylaxis, then go back to aspirin 81 mg oral  daily   . docusate sodium (COLACE) 100 MG capsule Take 1 capsule (100 mg total) by mouth 2 (two) times daily. (Patient taking differently: Take 100 mg by mouth every other day. )   . feeding supplement, ENSURE ENLIVE, (ENSURE ENLIVE) LIQD Take 237 mLs by mouth 2 (two) times daily between meals.   . fish oil-omega-3 fatty acids 1000 MG capsule Take 1 g by mouth daily.   Marland Kitchen HYDROcodone-acetaminophen (NORCO/VICODIN) 5-325 MG tablet Take 1 tablet by mouth every 6 (six) hours as needed for moderate pain.   Marland Kitchen levothyroxine (SYNTHROID) 75 MCG tablet Take 1 tablet (75 mcg total) by mouth daily before breakfast.   . meclizine (ANTIVERT) 25 MG tablet Take 25 mg by mouth 4 (four) times daily as needed. Dizziness   . metoprolol tartrate (LOPRESSOR) 25 MG tablet Take 1 tablet (25 mg total) by mouth 2 (two) times daily. 07/28/2016: 07/28/16-Takes 1/2 pill daily  . nitroGLYCERIN (NITROSTAT) 0.4 MG SL tablet Place 0.4 mg under the tongue every 5 (five) minutes x 3 doses as needed. For chest pain. 04/04/2014: Has not taken  . ondansetron (ZOFRAN) 8 MG tablet Take 1 tablet (8 mg total) by mouth every 8 (eight) hours as needed for nausea or vomiting.   . pantoprazole (PROTONIX) 40 MG tablet Take 1 tablet (40 mg total) by mouth daily.   . prochlorperazine (COMPAZINE) 10 MG tablet Take 1 tablet (10 mg total) by mouth every 6 (six) hours as needed for nausea or vomiting.   . senna-docusate (SENOKOT-S) 8.6-50 MG tablet Take 1 tablet by mouth at bedtime as needed for mild constipation.   . sulfamethoxazole-trimethoprim (BACTRIM DS,SEPTRA DS) 800-160 MG tablet Take 0.5 tablets by mouth 2 (two) times daily.   . Vitamin D, Ergocalciferol, (DRISDOL) 50000 UNITS CAPS Take 50,000 Units by mouth 2 (two) times a week. Tuesdays and Thurdays    No facility-administered encounter medications on file as of 07/28/2016.      ALLERGIES:  Allergies  Allergen Reactions  . Ciprofloxacin Other (See Comments)    Hallucination  . Lipitor  [Atorvastatin] Other (See Comments)    Muscle Weakness  . Penicillins Rash    Has patient had a PCN reaction causing immediate rash, facial/tongue/throat swelling, SOB or lightheadedness with hypotension: unknown Has patient had a PCN reaction causing severe rash involving mucus membranes or skin necrosis: unknown Has patient had a PCN reaction that required hospitalization: unknown Has patient had a PCN reaction occurring within the last 10 years: unknown If all of the above answers are "NO", then may proceed with Cephalosporin use. 2     PHYSICAL EXAM:  ECOG Performance status: 2 - Symptomatic; requires assistance   Vitals:  07/28/16 1045  BP: (!) 143/36  Pulse: (!) 51  Resp: 16  Temp: (!) 97.5 F (36.4 C)   Filed Weights   07/28/16 1045  Weight: 156 lb 11.2 oz (71.1 kg)    Physical Exam  Constitutional: She is oriented to person, place, and time and well-developed, well-nourished, and in no distress. No distress.  Exam done in wheelchair.   HENT:  Head: Normocephalic and atraumatic.  Mouth/Throat: Oropharynx is clear and moist. No oropharyngeal exudate.  She is mildly hard of hearing   Eyes: Pupils are equal, round, and reactive to light. Conjunctivae are normal. No scleral icterus.  Neck: Normal range of motion. Neck supple. No JVD present.  Cardiovascular: Normal rate, regular rhythm and normal heart sounds.  Exam reveals no gallop and no friction rub.   No murmur heard. Pulmonary/Chest: Effort normal and breath sounds normal. No respiratory distress. She has no wheezes. She has no rales.  Abdominal: Soft. Bowel sounds are normal. She exhibits no distension. There is no tenderness. There is no rebound and no guarding.  Musculoskeletal: Normal range of motion. She exhibits no edema or tenderness.  Lymphadenopathy:    She has no cervical adenopathy.  Neurological: She is alert and oriented to person, place, and time. No cranial nerve deficit.  Skin: Skin is warm and  dry. No rash noted. No erythema. No pallor.  Psychiatric: Mood, memory, affect and judgment normal.  Nursing note and vitals reviewed.   LABORATORY DATA:  I have reviewed the labs as listed.  CBC    Component Value Date/Time   WBC 5.2 07/28/2016 1020   RBC 3.38 (L) 07/28/2016 1020   HGB 10.7 (L) 07/28/2016 1020   HCT 32.5 (L) 07/28/2016 1020   PLT 174 07/28/2016 1020   MCV 96.2 07/28/2016 1020   MCH 31.7 07/28/2016 1020   MCHC 32.9 07/28/2016 1020   RDW 14.5 07/28/2016 1020   LYMPHSABS 1.4 07/28/2016 1020   MONOABS 0.4 07/28/2016 1020   EOSABS 0.2 07/28/2016 1020   BASOSABS 0.0 07/28/2016 1020   CMP Latest Ref Rng & Units 07/07/2016 06/22/2016 06/16/2016  Glucose 65 - 99 mg/dL 228(H) 120(H) 260(H)  BUN 6 - 20 mg/dL 24(H) 18 23(H)  Creatinine 0.44 - 1.00 mg/dL 1.81(H) 1.88(H) 1.95(H)  Sodium 135 - 145 mmol/L 131(L) 131(L) 134(L)  Potassium 3.5 - 5.1 mmol/L 4.3 5.7(H) 3.1(L)  Chloride 101 - 111 mmol/L 97(L) 99(L) 98(L)  CO2 22 - 32 mmol/L 25 24 27   Calcium 8.9 - 10.3 mg/dL 9.2 9.4 8.9  Total Protein 6.5 - 8.1 g/dL 6.7 5.9(L) 6.0(L)  Total Bilirubin 0.3 - 1.2 mg/dL 0.8 0.6 0.6  Alkaline Phos 38 - 126 U/L 104 110 111  AST 15 - 41 U/L 22 24 25   ALT 14 - 54 U/L 11(L) 10(L) 10(L)    PENDING LABS:    DIAGNOSTIC IMAGING:  MRI abdomen: 11/19/15     PATHOLOGY:  Liver biopsy: 12/07/15       ASSESSMENT & PLAN:   Stage IV Urothelial carcinoma, multifocal R ureteral masses with liver metastases: -Patient tolerating her treatments well and has improved quality of life with her treatments.  -Proceed with cycle 6 as scheduled today. -Plan to repeat her restaging scans in September 2018.   Return to clinic in 3 weeks for follow-up and her next treatment.   Twana First, MD

## 2016-07-29 ENCOUNTER — Ambulatory Visit: Payer: Medicare Other | Admitting: Orthopedic Surgery

## 2016-07-30 LAB — URINE CULTURE: Culture: >=100000 — AB

## 2016-08-01 ENCOUNTER — Other Ambulatory Visit (HOSPITAL_COMMUNITY): Payer: Self-pay | Admitting: Oncology

## 2016-08-01 MED ORDER — NITROFURANTOIN MONOHYD MACRO 100 MG PO CAPS: 100.0000 mg | ORAL_CAPSULE | Freq: Every day | ORAL | 0 refills | Status: DC

## 2016-08-01 NOTE — Progress Notes (Signed)
Please call the patient and let her know that her urine is growing out coagulase negative staph and I'm sending in a Rx for macrobid which the bacteria is sensitive to. She is to take it for 10 days.

## 2016-08-16 ENCOUNTER — Other Ambulatory Visit (HOSPITAL_COMMUNITY): Payer: Self-pay | Admitting: *Deleted

## 2016-08-16 DIAGNOSIS — D696 Thrombocytopenia, unspecified: Secondary | ICD-10-CM

## 2016-08-17 ENCOUNTER — Other Ambulatory Visit (HOSPITAL_COMMUNITY): Payer: Self-pay | Admitting: Oncology

## 2016-08-18 ENCOUNTER — Other Ambulatory Visit (HOSPITAL_COMMUNITY): Payer: Medicare Other

## 2016-08-18 ENCOUNTER — Encounter (HOSPITAL_COMMUNITY): Payer: Medicare Other | Attending: Hematology & Oncology

## 2016-08-18 ENCOUNTER — Encounter (HOSPITAL_BASED_OUTPATIENT_CLINIC_OR_DEPARTMENT_OTHER): Payer: Medicare Other | Admitting: Adult Health

## 2016-08-18 ENCOUNTER — Encounter (HOSPITAL_COMMUNITY): Payer: Self-pay | Admitting: Adult Health

## 2016-08-18 VITALS — BP 145/66 | HR 50 | Temp 98.6°F | Resp 18 | Wt 160.0 lb

## 2016-08-18 DIAGNOSIS — Z5112 Encounter for antineoplastic immunotherapy: Secondary | ICD-10-CM | POA: Diagnosis not present

## 2016-08-18 DIAGNOSIS — C787 Secondary malignant neoplasm of liver and intrahepatic bile duct: Secondary | ICD-10-CM

## 2016-08-18 DIAGNOSIS — C689 Malignant neoplasm of urinary organ, unspecified: Secondary | ICD-10-CM | POA: Diagnosis not present

## 2016-08-18 DIAGNOSIS — N184 Chronic kidney disease, stage 4 (severe): Secondary | ICD-10-CM

## 2016-08-18 DIAGNOSIS — Z5111 Encounter for antineoplastic chemotherapy: Secondary | ICD-10-CM

## 2016-08-18 LAB — COMPREHENSIVE METABOLIC PANEL
ALT: 17 U/L (ref 14–54)
AST: 20 U/L (ref 15–41)
Albumin: 3.2 g/dL — ABNORMAL LOW (ref 3.5–5.0)
Alkaline Phosphatase: 95 U/L (ref 38–126)
Anion gap: 9 (ref 5–15)
BUN: 29 mg/dL — ABNORMAL HIGH (ref 6–20)
CHLORIDE: 102 mmol/L (ref 101–111)
CO2: 25 mmol/L (ref 22–32)
CREATININE: 1.84 mg/dL — AB (ref 0.44–1.00)
Calcium: 8.9 mg/dL (ref 8.9–10.3)
GFR, EST AFRICAN AMERICAN: 28 mL/min — AB (ref >60)
GFR, EST NON AFRICAN AMERICAN: 24 mL/min — AB (ref >60)
Glucose, Bld: 169 mg/dL — ABNORMAL HIGH (ref 65–99)
POTASSIUM: 3.9 mmol/L (ref 3.5–5.1)
Sodium: 136 mmol/L (ref 135–145)
TOTAL PROTEIN: 6.3 g/dL — AB (ref 6.5–8.1)
Total Bilirubin: 0.6 mg/dL (ref 0.3–1.2)

## 2016-08-18 LAB — CBC WITH DIFFERENTIAL/PLATELET
Basophils Absolute: 0 10*3/uL (ref 0.0–0.1)
Basophils Relative: 1 %
EOS PCT: 2 %
Eosinophils Absolute: 0.1 10*3/uL (ref 0.0–0.7)
HCT: 34.9 % — ABNORMAL LOW (ref 36.0–46.0)
Hemoglobin: 11.3 g/dL — ABNORMAL LOW (ref 12.0–15.0)
LYMPHS ABS: 1.3 10*3/uL (ref 0.7–4.0)
LYMPHS PCT: 22 %
MCH: 31.6 pg (ref 26.0–34.0)
MCHC: 32.4 g/dL (ref 30.0–36.0)
MCV: 97.5 fL (ref 78.0–100.0)
MONOS PCT: 10 %
Monocytes Absolute: 0.6 10*3/uL (ref 0.1–1.0)
NEUTROS PCT: 65 %
Neutro Abs: 3.7 10*3/uL (ref 1.7–7.7)
PLATELETS: 184 10*3/uL (ref 150–400)
RBC: 3.58 MIL/uL — ABNORMAL LOW (ref 3.87–5.11)
RDW: 13.6 % (ref 11.5–15.5)
WBC: 5.8 10*3/uL (ref 4.0–10.5)

## 2016-08-18 MED ORDER — SODIUM CHLORIDE 0.9 % IV SOLN
Freq: Once | INTRAVENOUS | Status: AC
  Administered 2016-08-18: 11:00:00 via INTRAVENOUS

## 2016-08-18 MED ORDER — SODIUM CHLORIDE 0.9 % IV SOLN
1200.0000 mg | Freq: Once | INTRAVENOUS | Status: AC
  Administered 2016-08-18: 1200 mg via INTRAVENOUS
  Filled 2016-08-18: qty 20

## 2016-08-18 NOTE — Progress Notes (Signed)
Chemotherapy given today per orders. Patient tolerated it well without problems. Vitals stable and discharged home from clinic via wheelchair. Follow up as scheduled. 

## 2016-08-18 NOTE — Progress Notes (Signed)
[place], [place] [address]   CLINIC:  Medical Oncology/Hematology  PCP:  Monico Blitz, MD [address] [place] [address] [phone]   REASON FOR VISIT:  Follow-up for Stage IV Urothelial Carcinoma with liver mets  CURRENT THERAPY: Tecentriq IV every 21 days, beginning on 02/04/16   BRIEF ONCOLOGIC HISTORY:    Urothelial cancer ([place])   10/22/2015 Imaging    Presented with recurrent UTI and hematuria. Underwent cystoscopy and retrograde evaluation with Dr. Exie Parody revealed multiple filling defects in distal (R) ureter. Marked irregularly of proximal (R) ureter concerning for malignancy. (+) hydronephrosis noted and tumor noted out of ureteral orifice.        11/19/2015 Imaging    MRI abdomen: 3.2 cm mass in segment 4B of left hepatic lobe, suspicious for liver metastasis. Moderate right hydronephrosis and diffuse renal parenchymal atrophy. Abnormal signal intensity throughout right renal collecting system and proximal right ureter, consistent with known urothelial carcinoma. No other sites of abdominal metastatic disease identified.      12/07/2015 Procedure    Liver biopsy: Poorly differentiated carcinoma consistent with metastatic transitional cell histology.       01/01/2016 Initial Diagnosis    Urothelial carcinoma ([place])      02/04/2016 -  Chemotherapy    atezolizumab (TECENTRIQ) 1,200 mg IV every 21 days        06/09/2016 Imaging    CT C/A/P: Interval decrease in size of hepatic metastatic lesion.  Interval decrease in distention of the right renal collecting system and right ureter with central high attenuation material compatible with known primary urothelial malignancy.  Bilateral pulmonary nodules as above. Recommend attention on follow-up.  Subacute right-sided rib fractures.       HISTORY OF PRESENT ILLNESS (From Dr. Donald Pore note on 01/27/2016):  [name] 83 y.o. femaleis here because of referral from  Dr. Alen Blew for ureteral cancer. The patient lives closer to [facility] and felt it would be more convenient for her and her family to follow-up in [place].  Per Dr. Hazeline Junker note on 12/18/2015: "83 year old woman with history of hypertension, hyperlipidemia and coronary artery disease. She has been noted to have recurrent UTI and hematuria but subsequently underwent a cystoscopy and retrograde evaluation by Dr. Exie Parody in October 2017. Retrograde evaluation showed multiple filling defects in the distal right ureter. There is marked irregularity of the proximal right ureter specialist for malignant process. She also had an ultrasound which showed hydronephrosis and her cystoscopy on 10/22/2015 showed a tumor noted out of the ureteral orifice. MRI of the abdomen obtained on 11/19/2015 showed a 3.2 cm mass of the left hepatic lobe suspicious for liver metastasis. Moderate right hydronephrosis was also noted. No other sites of abdominal metastasis noted. She underwent a biopsy of her liver lesion on 12/07/2015 which showed metastatic carcinoma consistent with transitional cell histology. She was referred to me for evaluation regarding these findings. Clinically, she is asymptomatic from this. She is no longer reporting any hematuria, dysuria or abdominal pain. She does not report any flank pain. Her performance status is limited related to arthritis. She ambulates short distances inside her house. She relies on her daughter for most activities of daily living."      INTERVAL HISTORY:  Ms. Dudas presents today for follow-up for Stage IV urothelial carcinoma.  Due for next cycle of Tecentriq today.  She is here today with her daughter.  Overall, she tells me she is feeling pretty  well.  Appetite 75%; energy levels 25%. Although her energy levels are low, she is pleased that she is able to do more things and feels up to being more active with her family.  She recently went shopping at the flea  market with her daughter; she visits her husband of 26 years every night at the nursing facility where he lives.    She has some pain in her legs, which has been chronic since her fall when she broke her right hip several months ago.  Pain is manageable and not worse.  She has some intermittent ankle swelling, the left worse than the right at times.   Denies any bleeding episodes including blood in her stools, dark/tarry stools, or hematuria/dysuria. Denies any fever/chills, rash or diarrhea.   She feels ready for her next cycle of Tecentriq today.     REVIEW OF SYSTEMS:  Review of Systems  Constitutional: Positive for fatigue. Negative for chills and fever.  HENT:  Negative.   Eyes: Negative.   Respiratory: Negative.  Negative for cough and shortness of breath.   Cardiovascular: Positive for leg swelling.  Gastrointestinal: Negative.  Negative for blood in stool, constipation, diarrhea, nausea and vomiting.  Endocrine: Negative.   Genitourinary: Negative.  Negative for dysuria and hematuria.   Musculoskeletal: Positive for arthralgias (chronic).  Skin: Negative.  Negative for rash.  Hematological: Negative.   Psychiatric/Behavioral: Negative.     PAST MEDICAL/SURGICAL HISTORY:  Past Medical History:  Diagnosis Date  . Arthritis   . Back pain, chronic   . CAD (coronary artery disease)    NSTEMI 04/2011 with newly diagnosed 3V CAD s/p PCI/DES mLAD 04/11/11 with residual dz (per Dr. Burt Knack, should have consideration of PCI of the occluded RCA based on symptoms and/or consideration of outpatient nuclear perfusion testing after the patient recovers from her infarct)  . Cancer ([place])    kidney ( Nov.2017)  . Chronic kidney disease (CKD), stage IV (severe) ([place]) 12/13/2013  . Hyperglycemia   . Hyperlipidemia   . Hypertension   . Hypothyroidism   . Ischemic cardiomyopathy    EF 45% by cath 04/20/11, 50-55% by echo 04/22/11. hypotension limiting medication titration   . Myocardial  infarction ([place]) 2013  . Type 2 diabetes with nephropathy ([place]) 07/26/2014  . UTI (urinary tract infection) 12/13/2013   Past Surgical History:  Procedure Laterality Date  . BACK SURGERY    . BREAST LUMPECTOMY     right  . CORONARY STENT PLACEMENT    . HIP ARTHROPLASTY Right 04/29/2016   Procedure: ARTHROPLASTY BIPOLAR HIP (HEMIARTHROPLASTY);  Surgeon: Carole Civil, MD;  Location: [facility];  Service: Orthopedics;  Laterality: Right;  . LEFT HEART CATHETERIZATION WITH CORONARY ANGIOGRAM N/A 04/20/2011   Procedure: LEFT HEART CATHETERIZATION WITH CORONARY ANGIOGRAM;  Surgeon: Burnell Blanks, MD;  Location: [facility] CATH LAB;  Service: Cardiovascular;  Laterality: N/A;  . PERCUTANEOUS CORONARY STENT INTERVENTION (PCI-S) N/A 04/21/2011   Procedure: PERCUTANEOUS CORONARY STENT INTERVENTION (PCI-S);  Surgeon: Sherren Mocha, MD;  Location: [facility] CATH LAB;  Service: Cardiovascular;  Laterality: N/A;     SOCIAL HISTORY:  Social History   Social History  . Marital status: Married    Spouse name: N/A  . Number of children: N/A  . Years of education: N/A   Occupational History  . Not on file.   Social History Main Topics  . Smoking status: Never Smoker  . Smokeless tobacco: Never Used  . Alcohol use No  . Drug use: No  .  Sexual activity: No     Comment: married 61 years-husband at [facility] [employer] [employer] [facility] [employer]   Other Topics Concern  . Not on file   Social History Narrative   Lives in [place], [place] with her husband and daughter.    FAMILY HISTORY:  Family History  Problem Relation Age of Onset  . Other Mother   . Cancer Father   . Other Unknown        no known family cardiac disease  . Other Brother        Diptheria  . Depression Sister   . Pneumonia Sister   . Other Brother        murdered    CURRENT MEDICATIONS:  Outpatient Encounter Prescriptions as of 08/18/2016  Medication Sig Note  . amLODipine (NORVASC) 5 MG tablet Take 1 tablet (5 mg total) by mouth daily.   Marland Kitchen aspirin EC 325 MG EC  tablet Take 1 tablet (325 mg total) by mouth daily with breakfast. Continue with 325 mg oral daily for total of 30 days for DVT prophylaxis, then go back to aspirin 81 mg oral daily   . docusate sodium (COLACE) 100 MG capsule Take 1 capsule (100 mg total) by mouth 2 (two) times daily. (Patient taking differently: Take 100 mg by mouth every other day. )   . feeding supplement, ENSURE ENLIVE, (ENSURE ENLIVE) LIQD Take 237 mLs by mouth 2 (two) times daily between meals.   . fish oil-omega-3 fatty acids 1000 MG capsule Take 1 g by mouth daily.   Marland Kitchen HYDROcodone-acetaminophen (NORCO/VICODIN) 5-325 MG tablet Take 1 tablet by mouth every 6 (six) hours as needed for moderate pain.   Marland Kitchen levothyroxine (SYNTHROID) 75 MCG tablet Take 1 tablet (75 mcg total) by mouth daily before breakfast.   . levothyroxine (SYNTHROID, LEVOTHROID) 112 MCG tablet Take 1 tablet (112 mcg total) by mouth daily before breakfast. Alternating with 75 mcg   . meclizine (ANTIVERT) 25 MG tablet Take 25 mg by mouth 4 (four) times daily as needed. Dizziness   . metoprolol tartrate (LOPRESSOR) 25 MG tablet Take 1 tablet (25 mg total) by mouth 2 (two) times daily. 07/28/2016: 07/28/16-Takes 1/2 pill daily  . nitrofurantoin, macrocrystal-monohydrate, (MACROBID) 100 MG capsule Take 1 capsule (100 mg total) by mouth daily.   . nitroGLYCERIN (NITROSTAT) 0.4 MG SL tablet Place 0.4 mg under the tongue every 5 (five) minutes x 3 doses as needed. For chest pain. 04/04/2014: Has not taken  . ondansetron (ZOFRAN) 8 MG tablet Take 1 tablet (8 mg total) by mouth every 8 (eight) hours as needed for nausea or vomiting.   . pantoprazole (PROTONIX) 40 MG tablet Take 1 tablet (40 mg total) by mouth daily.   . prochlorperazine (COMPAZINE) 10 MG tablet Take 1 tablet (10 mg total) by mouth every 6 (six) hours as needed for nausea or vomiting.   . senna-docusate (SENOKOT-S) 8.6-50 MG tablet Take 1 tablet by mouth at bedtime as needed for mild constipation.   . Vitamin D,  Ergocalciferol, (DRISDOL) 50000 UNITS CAPS Take 50,000 Units by mouth 2 (two) times a week. Tuesdays and Thurdays    No facility-administered encounter medications on file as of 08/18/2016.      ALLERGIES:  Allergies  Allergen Reactions  . Ciprofloxacin Other (See Comments)    Hallucination  . Lipitor [Atorvastatin] Other (See Comments)    Muscle Weakness  . Penicillins Rash    Has patient had a PCN reaction causing immediate rash, facial/tongue/throat swelling, SOB or lightheadedness with hypotension: unknown Has  patient had a PCN reaction causing severe rash involving mucus membranes or skin necrosis: unknown Has patient had a PCN reaction that required hospitalization: unknown Has patient had a PCN reaction occurring within the last 10 years: unknown If all of the above answers are "NO", then may proceed with Cephalosporin use. 2     PHYSICAL EXAM:  ECOG Performance status: 2 - 3 - Symptomatic; requires assistance in performing ADLs.      Physical Exam  Constitutional: She is oriented to person, place, and time.  -Frail elderly female in no acute distress -Exam done in chemo chair in infusion area   HENT:  Head: Normocephalic.  Mouth/Throat: Oropharynx is clear and moist.  Eyes: Conjunctivae are normal. No scleral icterus.  Neck: Normal range of motion. Neck supple.  Cardiovascular: Normal rate and regular rhythm.   Pulmonary/Chest: Effort normal and breath sounds normal. No respiratory distress.  Abdominal: Soft. Bowel sounds are normal. There is no tenderness.  Musculoskeletal: Normal range of motion. She exhibits edema (1+ ankle edema bilat, L > R).  Lymphadenopathy:    She has no cervical adenopathy.       Right: No supraclavicular adenopathy present.       Left: No supraclavicular adenopathy present.  Neurological: She is alert and oriented to person, place, and time. No cranial nerve deficit.  Skin: Skin is warm and dry. No rash noted.  Psychiatric: Mood,  memory, affect and judgment normal.  Nursing note and vitals reviewed.   LABORATORY DATA:  I have reviewed the labs as listed.  CBC    Component Value Date/Time   WBC 5.8 08/18/2016 0939   RBC 3.58 (L) 08/18/2016 0939   HGB 11.3 (L) 08/18/2016 0939   HCT 34.9 (L) 08/18/2016 0939   PLT 184 08/18/2016 0939   MCV 97.5 08/18/2016 0939   MCH 31.6 08/18/2016 0939   MCHC 32.4 08/18/2016 0939   RDW 13.6 08/18/2016 0939   LYMPHSABS 1.3 08/18/2016 0939   MONOABS 0.6 08/18/2016 0939   EOSABS 0.1 08/18/2016 0939   BASOSABS 0.0 08/18/2016 0939   CMP Latest Ref Rng & Units 08/18/2016 07/28/2016 07/07/2016  Glucose 65 - 99 mg/dL 169(H) 233(H) 228(H)  BUN 6 - 20 mg/dL 29(H) 29(H) 24(H)  Creatinine 0.44 - 1.00 mg/dL 1.84(H) 1.76(H) 1.81(H)  Sodium 135 - 145 mmol/L 136 134(L) 131(L)  Potassium 3.5 - 5.1 mmol/L 3.9 3.8 4.3  Chloride 101 - 111 mmol/L 102 103 97(L)  CO2 22 - 32 mmol/L 25 25 25   Calcium 8.9 - 10.3 mg/dL 8.9 8.7(L) 9.2  Total Protein 6.5 - 8.1 g/dL 6.3(L) 6.1(L) 6.7  Total Bilirubin 0.3 - 1.2 mg/dL 0.6 0.5 0.8  Alkaline Phos 38 - 126 U/L 95 123 104  AST 15 - 41 U/L 20 31 22   ALT 14 - 54 U/L 17 40 11(L)    PENDING LABS:    DIAGNOSTIC IMAGING:  CT chest/abd/pelvis: 06/09/16 CLINICAL DATA:  Patient with stage IV urothelial carcinoma. Liver metastasis. Restaging evaluation.  EXAM: CT CHEST, ABDOMEN AND PELVIS WITHOUT CONTRAST  TECHNIQUE: Multidetector CT imaging of the chest, abdomen and pelvis was performed following the standard protocol without IV contrast.  COMPARISON:  CT abdomen pelvis 10/28/2015  FINDINGS: CT CHEST FINDINGS  Cardiovascular: Heart is mildly enlarged. No pericardial effusion. Dense coronary arterial vascular calcifications. Aortic vascular calcifications.  Mediastinum/Nodes: No enlarged axillary, mediastinal or hilar lymphadenopathy. Esophagus is unremarkable.  Lungs/Pleura: Central airways are patent. Subpleural atelectasis within the  bilateral lower lobes. 4 mm nodule  left upper lobe (image 60; series 3). 7 mm ground-glass nodule left upper lobe (image 69; series 3). 6 mm lingular nodule (image 86; series 3). 2 mm right middle lobe nodule (image 85; series 3). 4 mm right middle lobe nodule (image 90; series 3). Trace bilateral pleural effusions. No pneumothorax.  Musculoskeletal: Thoracic spine degenerative changes. No aggressive or acute appearing osseous lesions. Subacute posterior right eleventh, eighth and tenth rib fractures (image 124; series 3).  CT ABDOMEN PELVIS FINDINGS  Hepatobiliary: Liver is normal in size and contour. Interval decrease in size of previously described segment 4B lesion measuring 1.1 cm (image 51; series 2), previously 2.7 cm. Gallbladder is mildly distended.  Pancreas: Fatty atrophy of the pancreatic parenchyma.  Spleen: Unremarkable  Adrenals/Urinary Tract: The adrenal glands are normal. Re- demonstrated atrophy of the right kidney. Interval decrease in mild right hydronephrosis with increased density material within the right renal collecting system. Decreased thickness of the mid right ureter measuring 8 mm (image 72; series 2), previously 12 mm. Left kidney is normal in appearance. Urinary bladder is unremarkable.  Stomach/Bowel: Oral contrast material throughout the large and small bowel. Normal appendix. No abnormal bowel wall thickening or evidence for bowel obstruction. No free fluid or free intraperitoneal air. Normal morphology of the stomach.  Vascular/Lymphatic: Peripheral calcified atherosclerotic plaque involving the abdominal aorta. No retroperitoneal lymphadenopathy.  Reproductive: Uterus and adnexal structures are unremarkable.  Other: None.  Musculoskeletal: Right hip arthroplasty. Left hip degenerative changes. Osseous demineralization. Lumbar spine degenerative changes. No aggressive or acute appearing osseous lesions. Unchanged L2 wedge  compression deformity.  IMPRESSION: Interval decrease in size of hepatic metastatic lesion.  Interval decrease in distention of the right renal collecting system and right ureter with central high attenuation material compatible with known primary urothelial malignancy.  Bilateral pulmonary nodules as above. Recommend attention on follow-up.  Subacute right-sided rib fractures.   Electronically Signed   By: Lovey Newcomer M.D.   On: 06/09/2016 18:15     PATHOLOGY:  Liver biopsy: 12/07/15       ASSESSMENT & PLAN:   Stage IV Urothelial carcinoma, multifocal R ureteral masses with liver metastases: -s/p 6 cycles of Tecentriq thus far. Recent restating CT chest/abd/pelvis in 06/2016 showed response to treatment with reduction in size of liver mets.  -Due for cycle #7 today; labs reviewed and are adequate for treatment.  No concerning reported immunotherapy adverse side effects.  -Next restaging scans are due in 09/2016; orders placed today for CT chest/abd/pelvis.   -Return to cancer center in 3 weeks for follow-up with Dr. Talbert Cage for additional treatment planning after restaging CT scans.        Dispo:  -Restaging CT chest/abd/pelvis due in 09/2016; orders placed today.  -Return to cancer center in 3 weeks for follow-up with Dr. Talbert Cage and subsequent treatment planning.    All questions were answered to patient's stated satisfaction. Encouraged patient to call with any new concerns or questions before her next visit to the cancer center and we can certain see her sooner, if needed.     Plan of care discussed with Dr. Talbert Cage, who agrees with the above aforementioned.    Orders placed this encounter:  Orders Placed This Encounter  Procedures  . CT Chest W Contrast  . CT Abdomen Pelvis W Contrast     Mike Craze, NP [place] [phone]

## 2016-08-18 NOTE — Patient Instructions (Signed)
[facility] at [facility] Discharge Instructions  RECOMMENDATIONS MADE BY THE CONSULTANT AND ANY TEST RESULTS WILL BE SENT TO YOUR REFERRING PHYSICIAN.  YOU WERE SEEN TODAY BY GRETCHEN DAWSON NP.   Thank you for choosing [facility] at [facility] to provide your oncology and hematology care.  To afford each patient quality time with our provider, please arrive at least 15 minutes before your scheduled appointment time.    If you have a lab appointment with the Cancer Center please come in thru the  Main Entrance and check in at the main information desk  You need to re-schedule your appointment should you arrive 10 or more minutes late.  We strive to give you quality time with our providers, and arriving late affects you and other patients whose appointments are after yours.  Also, if you no show three or more times for appointments you may be dismissed from the clinic at the providers discretion.     Again, thank you for choosing [facility].  Our hope is that these requests will decrease the amount of time that you wait before being seen by our physicians.       _____________________________________________________________  Should you have questions after your visit to  Cancer Center, please contact our office at [phone] between the hours of 8:30 a.m. and 4:30 p.m.  Voicemails left after 4:30 p.m. will not be returned until the following business day.  For prescription refill requests, have your pharmacy contact our office.       Resources For Cancer Patients and their Caregivers ? American Cancer Society: Can assist with transportation, wigs, general needs, runs Look Good Feel Better.        1-888-227-6333 ? Cancer Care: Provides financial assistance, online support groups, medication/co-pay assistance.  1-800-813-HOPE (4673) ? [facility] Assists [place] cancer patients and  their families through emotional , educational and financial support.  336-427-4357 ? [place] DSS Where to apply for food stamps, Medicaid and utility assistance. 336-342-1394 ? [facility]: Transportation to medical appointments. 336-347-2287 ? Social Security Administration: May apply for disability if have a Stage IV cancer. 336-342-7796 1-800-772-1213 ? [place] Aging, Disability and Transit Services: Assists with nutrition, care and transit needs. 336-349-2343  Cancer Center Support Programs: @[email or web address]@ > Cancer Support Group  2nd Tuesday of the month 1pm-2pm, Journey Room  > Creative Journey  3rd Tuesday of the month 1130am-1pm, Journey Room  > Look Good Feel Better  1st Wednesday of the month 10am-12 noon, Journey Room (Call American Cancer Society to register 1-800-395-5775)    

## 2016-08-18 NOTE — Patient Instructions (Signed)
[facility] Discharge Instructions for Patients Receiving Chemotherapy   Beginning January 23rd 2017 lab work for the Cancer Center will be done in the  Main lab at  on 1st floor. If you have a lab appointment with the Cancer Center please come in thru the  Main Entrance and check in at the main information desk   Today you received the following chemotherapy agents   To help prevent nausea and vomiting after your treatment, we encourage you to take your nausea medication     If you develop nausea and vomiting, or diarrhea that is not controlled by your medication, call the clinic.  The clinic phone number is [phone]. Office hours are Monday-Friday 8:30am-5:00pm.  BELOW ARE SYMPTOMS THAT SHOULD BE REPORTED IMMEDIATELY:  *FEVER GREATER THAN 101.0 F  *CHILLS WITH OR WITHOUT FEVER  NAUSEA AND VOMITING THAT IS NOT CONTROLLED WITH YOUR NAUSEA MEDICATION  *UNUSUAL SHORTNESS OF BREATH  *UNUSUAL BRUISING OR BLEEDING  TENDERNESS IN MOUTH AND THROAT WITH OR WITHOUT PRESENCE OF ULCERS  *URINARY PROBLEMS  *BOWEL PROBLEMS  UNUSUAL RASH Items with * indicate a potential emergency and should be followed up as soon as possible. If you have an emergency after office hours please contact your primary care physician or go to the nearest emergency department.  Please call the clinic during office hours if you have any questions or concerns.   You may also contact the Patient Navigator at [phone] should you have any questions or need assistance in obtaining follow up care.      Resources For Cancer Patients and their Caregivers ? American Cancer Society: Can assist with transportation, wigs, general needs, runs Look Good Feel Better.        1-888-227-6333 ? Cancer Care: Provides financial assistance, online support groups, medication/co-pay assistance.  1-800-813-HOPE (4673) ? [facility] Assists [place] cancer  patients and their families through emotional , educational and financial support.  336-427-4357 ? [place] DSS Where to apply for food stamps, Medicaid and utility assistance. 336-342-1394 ? [facility]: Transportation to medical appointments. 336-347-2287 ? Social Security Administration: May apply for disability if have a Stage IV cancer. 336-342-7796 1-800-772-1213 ? [place] Aging, Disability and Transit Services: Assists with nutrition, care and transit needs. 336-349-2343         

## 2016-09-05 ENCOUNTER — Other Ambulatory Visit (HOSPITAL_COMMUNITY): Payer: Self-pay | Admitting: Oncology

## 2016-09-05 DIAGNOSIS — E039 Hypothyroidism, unspecified: Secondary | ICD-10-CM

## 2016-09-06 ENCOUNTER — Encounter (HOSPITAL_COMMUNITY): Payer: Medicare Other | Attending: Hematology & Oncology

## 2016-09-06 ENCOUNTER — Ambulatory Visit (HOSPITAL_COMMUNITY)
Admission: RE | Admit: 2016-09-06 | Discharge: 2016-09-06 | Disposition: A | Discharge status: Home / Self Care | Payer: Medicare Other | Source: Ambulatory Visit | Attending: Adult Health | Admitting: Adult Health

## 2016-09-06 VITALS — BP 159/49 | HR 56 | Temp 97.7°F | Resp 19 | Wt 154.6 lb

## 2016-09-06 DIAGNOSIS — I700 Atherosclerosis of aorta: Secondary | ICD-10-CM | POA: Diagnosis not present

## 2016-09-06 DIAGNOSIS — C689 Malignant neoplasm of urinary organ, unspecified: Secondary | ICD-10-CM | POA: Diagnosis not present

## 2016-09-06 DIAGNOSIS — Z5189 Encounter for other specified aftercare: Secondary | ICD-10-CM | POA: Diagnosis not present

## 2016-09-06 DIAGNOSIS — C787 Secondary malignant neoplasm of liver and intrahepatic bile duct: Secondary | ICD-10-CM | POA: Diagnosis not present

## 2016-09-06 DIAGNOSIS — K7460 Unspecified cirrhosis of liver: Secondary | ICD-10-CM | POA: Insufficient documentation

## 2016-09-06 DIAGNOSIS — N184 Chronic kidney disease, stage 4 (severe): Secondary | ICD-10-CM | POA: Diagnosis not present

## 2016-09-06 DIAGNOSIS — S2241XD Multiple fractures of ribs, right side, subsequent encounter for fracture with routine healing: Secondary | ICD-10-CM | POA: Insufficient documentation

## 2016-09-06 DIAGNOSIS — M4856XA Collapsed vertebra, not elsewhere classified, lumbar region, initial encounter for fracture: Secondary | ICD-10-CM | POA: Insufficient documentation

## 2016-09-06 DIAGNOSIS — K766 Portal hypertension: Secondary | ICD-10-CM | POA: Insufficient documentation

## 2016-09-06 DIAGNOSIS — I864 Gastric varices: Secondary | ICD-10-CM | POA: Diagnosis not present

## 2016-09-06 DIAGNOSIS — R918 Other nonspecific abnormal finding of lung field: Secondary | ICD-10-CM | POA: Diagnosis not present

## 2016-09-06 DIAGNOSIS — E860 Dehydration: Secondary | ICD-10-CM

## 2016-09-06 LAB — BASIC METABOLIC PANEL
ANION GAP: 6 (ref 5–15)
BUN: 23 mg/dL — ABNORMAL HIGH (ref 6–20)
CALCIUM: 8.4 mg/dL — AB (ref 8.9–10.3)
CO2: 25 mmol/L (ref 22–32)
Chloride: 99 mmol/L — ABNORMAL LOW (ref 101–111)
Creatinine, Ser: 1.48 mg/dL — ABNORMAL HIGH (ref 0.44–1.00)
GFR, EST AFRICAN AMERICAN: 37 mL/min — AB (ref >60)
GFR, EST NON AFRICAN AMERICAN: 32 mL/min — AB (ref >60)
GLUCOSE: 174 mg/dL — AB (ref 65–99)
POTASSIUM: 4.1 mmol/L (ref 3.5–5.1)
Sodium: 130 mmol/L — ABNORMAL LOW (ref 135–145)

## 2016-09-06 MED ORDER — SODIUM CHLORIDE 0.9 % IV SOLN
INTRAVENOUS | Status: DC
  Administered 2016-09-06: 09:00:00 via INTRAVENOUS

## 2016-09-06 MED ORDER — IOPAMIDOL (ISOVUE-300) INJECTION 61%
75.0000 mL | Freq: Once | INTRAVENOUS | Status: AC | PRN
  Administered 2016-09-06: 75 mL via INTRAVENOUS

## 2016-09-06 MED ORDER — SODIUM CHLORIDE 0.9 % IV SOLN: INTRAVENOUS | Status: DC

## 2016-09-06 NOTE — Progress Notes (Signed)
Tolerated infusion w/o adverse reaction.  Alert, in no distress.  Repeat BMP shows improved kidney function after IVF.  Spoke with [facility] in radiology and informed her of pt's lab results.  Okay to send pt for CT at this time per rad tech. Discharged with saline lock to left AC patent and intact via wheelchair in c/o daughter for transport to radiology for CT abd/pelvis.

## 2016-09-08 ENCOUNTER — Encounter (HOSPITAL_BASED_OUTPATIENT_CLINIC_OR_DEPARTMENT_OTHER): Payer: Medicare Other | Admitting: Oncology

## 2016-09-08 ENCOUNTER — Telehealth (HOSPITAL_COMMUNITY): Payer: Self-pay | Admitting: *Deleted

## 2016-09-08 ENCOUNTER — Encounter (HOSPITAL_BASED_OUTPATIENT_CLINIC_OR_DEPARTMENT_OTHER): Payer: Medicare Other

## 2016-09-08 ENCOUNTER — Encounter (HOSPITAL_COMMUNITY): Payer: Self-pay | Admitting: Oncology

## 2016-09-08 ENCOUNTER — Other Ambulatory Visit (HOSPITAL_COMMUNITY): Payer: Self-pay | Admitting: Adult Health

## 2016-09-08 ENCOUNTER — Encounter (HOSPITAL_COMMUNITY): Payer: Medicare Other | Attending: Oncology

## 2016-09-08 VITALS — BP 141/47 | HR 57 | Temp 98.2°F | Resp 16

## 2016-09-08 VITALS — BP 136/38 | HR 56 | Resp 18 | Ht 62.0 in | Wt 167.0 lb

## 2016-09-08 DIAGNOSIS — I10 Essential (primary) hypertension: Secondary | ICD-10-CM | POA: Diagnosis not present

## 2016-09-08 DIAGNOSIS — Z5112 Encounter for antineoplastic immunotherapy: Secondary | ICD-10-CM | POA: Diagnosis not present

## 2016-09-08 DIAGNOSIS — C787 Secondary malignant neoplasm of liver and intrahepatic bile duct: Secondary | ICD-10-CM | POA: Diagnosis not present

## 2016-09-08 DIAGNOSIS — N184 Chronic kidney disease, stage 4 (severe): Secondary | ICD-10-CM

## 2016-09-08 DIAGNOSIS — D696 Thrombocytopenia, unspecified: Secondary | ICD-10-CM | POA: Insufficient documentation

## 2016-09-08 DIAGNOSIS — E039 Hypothyroidism, unspecified: Secondary | ICD-10-CM

## 2016-09-08 DIAGNOSIS — C689 Malignant neoplasm of urinary organ, unspecified: Secondary | ICD-10-CM

## 2016-09-08 LAB — CBC WITH DIFFERENTIAL/PLATELET
Basophils Absolute: 0 10*3/uL (ref 0.0–0.1)
Basophils Relative: 0 %
EOS ABS: 0.2 10*3/uL (ref 0.0–0.7)
EOS PCT: 3 %
HCT: 31.4 % — ABNORMAL LOW (ref 36.0–46.0)
HEMOGLOBIN: 10.5 g/dL — AB (ref 12.0–15.0)
Lymphocytes Relative: 20 %
Lymphs Abs: 1.1 10*3/uL (ref 0.7–4.0)
MCH: 32.1 pg (ref 26.0–34.0)
MCHC: 33.4 g/dL (ref 30.0–36.0)
MCV: 96 fL (ref 78.0–100.0)
MONO ABS: 0.7 10*3/uL (ref 0.1–1.0)
MONOS PCT: 13 %
Neutro Abs: 3.6 10*3/uL (ref 1.7–7.7)
Neutrophils Relative %: 64 %
Platelets: 183 10*3/uL (ref 150–400)
RBC: 3.27 MIL/uL — ABNORMAL LOW (ref 3.87–5.11)
RDW: 13.8 % (ref 11.5–15.5)
WBC: 5.6 10*3/uL (ref 4.0–10.5)

## 2016-09-08 LAB — COMPREHENSIVE METABOLIC PANEL
ALK PHOS: 103 U/L (ref 38–126)
ALT: 29 U/L (ref 14–54)
ANION GAP: 9 (ref 5–15)
AST: 32 U/L (ref 15–41)
Albumin: 3 g/dL — ABNORMAL LOW (ref 3.5–5.0)
BUN: 19 mg/dL (ref 6–20)
CO2: 23 mmol/L (ref 22–32)
CREATININE: 1.57 mg/dL — AB (ref 0.44–1.00)
Calcium: 8.5 mg/dL — ABNORMAL LOW (ref 8.9–10.3)
Chloride: 100 mmol/L — ABNORMAL LOW (ref 101–111)
GFR, EST AFRICAN AMERICAN: 34 mL/min — AB (ref >60)
GFR, EST NON AFRICAN AMERICAN: 30 mL/min — AB (ref >60)
Glucose, Bld: 242 mg/dL — ABNORMAL HIGH (ref 65–99)
Potassium: 3.8 mmol/L (ref 3.5–5.1)
SODIUM: 132 mmol/L — AB (ref 135–145)
TOTAL PROTEIN: 6.1 g/dL — AB (ref 6.5–8.1)
Total Bilirubin: 0.5 mg/dL (ref 0.3–1.2)

## 2016-09-08 LAB — TSH: TSH: 7.948 u[IU]/mL — AB (ref 0.350–4.500)

## 2016-09-08 MED ORDER — SODIUM CHLORIDE 0.9 % IV SOLN
1200.0000 mg | Freq: Once | INTRAVENOUS | Status: AC
  Administered 2016-09-08: 1200 mg via INTRAVENOUS
  Filled 2016-09-08: qty 20

## 2016-09-08 MED ORDER — HEPARIN SOD (PORK) LOCK FLUSH 100 UNIT/ML IV SOLN: 500.0000 [IU] | Freq: Once | INTRAVENOUS | Status: DC | PRN

## 2016-09-08 MED ORDER — LEVOTHYROXINE SODIUM 112 MCG PO TABS: 112.0000 ug | ORAL_TABLET | Freq: Every day | ORAL | 1 refills | Status: DC

## 2016-09-08 MED ORDER — SODIUM CHLORIDE 0.9 % IV SOLN
Freq: Once | INTRAVENOUS | Status: AC
  Administered 2016-09-08: 10:00:00 via INTRAVENOUS

## 2016-09-08 MED ORDER — SODIUM CHLORIDE 0.9% FLUSH
10.0000 mL | INTRAVENOUS | Status: DC | PRN
  Administered 2016-09-08: 10 mL
  Filled 2016-09-08: qty 10

## 2016-09-08 NOTE — Telephone Encounter (Signed)
Spoke with [name], pt's daughter re: how pt takes her levothyroxine - she states Ms. Halls takes 75 mcg alternating with 112 mcg daily. Angus Palms, NP of pt's current dosing.  Per NP, will change to 112 mcg daily, and discontinue the 75 mcg.  Debbie notified of this change - to take levothyroxine 112 mcg daily - and verbalizes understanding.

## 2016-09-08 NOTE — Patient Instructions (Signed)
[place] at [facility] Discharge Instructions  RECOMMENDATIONS MADE BY THE CONSULTANT AND ANY TEST RESULTS WILL BE SENT TO YOUR REFERRING PHYSICIAN.  You received your Tecentriq treatment today Follow up in 21 days for next treatment.  Thank you for choosing [place] at [facility] to provide your oncology and hematology care.  To afford each patient quality time with our provider, please arrive at least 15 minutes before your scheduled appointment time.    If you have a lab appointment with the [facility] please come in thru the  Main Entrance and check in at the main information desk  You need to re-schedule your appointment should you arrive 10 or more minutes late.  We strive to give you quality time with our providers, and arriving late affects you and other patients whose appointments are after yours.  Also, if you no show three or more times for appointments you may be dismissed from the clinic at the providers discretion.     Again, thank you for choosing [facility].  Our hope is that these requests will decrease the amount of time that you wait before being seen by our physicians.       _____________________________________________________________  Should you have questions after your visit to [facility], please contact our office at [phone] between the hours of 8:30 a.m. and 4:30 p.m.  Voicemails left after 4:30 p.m. will not be returned until the following business day.  For prescription refill requests, have your pharmacy contact our office.       Resources For Cancer Patients and their Caregivers ? American Cancer Society: Can assist with transportation, wigs, general needs, runs Look Good Feel Better.        220-578-4381 ? Cancer Care: Provides financial assistance, online support groups, medication/co-pay assistance.  1-800-813-HOPE 548-646-3311) ? [place] Assists [place] cancer patients and their families through emotional , educational and financial support.  9720474433 ? [place] DSS Where to apply for food stamps, Medicaid and utility assistance. 956-205-3507 ? RCATS: Transportation to medical appointments. (308)225-4738 ? Social Security Administration: May apply for disability if have a Stage IV cancer. (213)598-5043 (719)718-2148 ? [employer], Disability and Transit Services: Assists with nutrition, care and transit needs. [place] Support Programs: @10RELATIVEDAYS @ > Cancer Support Group  2nd Tuesday of the month 1pm-2pm, Journey Room  > Creative Journey  3rd Tuesday of the month 1130am-1pm, Journey Room  > Look Good Feel Better  1st Wednesday of the month 10am-12 noon, Journey Room (Call [place] to register [phone])

## 2016-09-08 NOTE — Progress Notes (Signed)
Joann Hunter, [place] [address]   CLINIC:  Medical Oncology/Hematology  PCP:  Joann Blitz, MD [address] [place] [address] [phone]   REASON FOR VISIT:  Follow-up for Stage IV Urothelial Carcinoma with liver mets  CURRENT THERAPY: Tecentriq IV every 21 days, beginning on 02/04/16   BRIEF ONCOLOGIC HISTORY:    Urothelial cancer ([place])   10/22/2015 Imaging    Presented with recurrent UTI and hematuria. Underwent cystoscopy and retrograde evaluation with Dr. Exie Hunter revealed multiple filling defects in distal (R) ureter. Marked irregularly of proximal (R) ureter concerning for malignancy. (+) hydronephrosis noted and tumor noted out of ureteral orifice.        11/19/2015 Imaging    MRI abdomen: 3.2 cm mass in segment 4B of left hepatic lobe, suspicious for liver metastasis. Moderate right hydronephrosis and diffuse renal parenchymal atrophy. Abnormal signal intensity throughout right renal collecting system and proximal right ureter, consistent with known urothelial carcinoma. No other sites of abdominal metastatic disease identified.      12/07/2015 Procedure    Liver biopsy: Poorly differentiated carcinoma consistent with metastatic transitional cell histology.       01/01/2016 Initial Diagnosis    Urothelial carcinoma ([place])      02/04/2016 -  Chemotherapy    atezolizumab (TECENTRIQ) 1,200 mg IV every 21 days        06/09/2016 Imaging    CT C/A/P: Interval decrease in size of hepatic metastatic lesion.  Interval decrease in distention of the right renal collecting system and right ureter with central high attenuation material compatible with known primary urothelial malignancy.  Bilateral pulmonary nodules as above. Recommend attention on follow-up.  Subacute right-sided rib fractures.      09/06/2016 Imaging    CT C/A/P: 1. Overall improvement. The bulky tumor in the right collecting system and right proximal ureter is  mildly reduced. Prior pulmonary nodules are no longer visible. Continued reduction in size of the segment 4b hepatic metastatic lesion which also demonstrates reduced conspicuity. 2. Other imaging findings of potential clinical significance: Aortic Atherosclerosis (ICD10-I70.0). Healing response from prior right posterior rib fractures. Hepatic cirrhosis. Gastric varices and splenorenal shunting indicating portal venous hypertension. Severe degenerative left hip arthropathy. Chronic compression fracture at L2. Bony demineralization.       HISTORY OF PRESENT ILLNESS (From Dr. Donald Hunter note on 01/27/2016):  Joann Hunter 83 y.o. femaleis here because of referral from Dr. Alen Hunter for ureteral cancer. The patient lives closer to [facility] and felt it would be more convenient for her and her family to follow-up in [place].  Per Dr. Hazeline Hunter note on 12/18/2015: "83 year old woman with history of hypertension, hyperlipidemia and coronary artery disease. She has been noted to have recurrent UTI and hematuria but subsequently underwent a cystoscopy and retrograde evaluation by Dr. Exie Hunter in October 2017. Retrograde evaluation showed multiple filling defects in the distal right ureter. There is marked irregularity of the proximal right ureter specialist for malignant process. She also had an ultrasound which showed hydronephrosis and her cystoscopy on 10/22/2015 showed a tumor noted out of the ureteral orifice. MRI of the abdomen obtained on 11/19/2015 showed a 3.2 cm mass of the left hepatic lobe suspicious for liver metastasis. Moderate right hydronephrosis was also noted. No other sites of abdominal metastasis noted. She underwent a biopsy of her liver lesion on 12/07/2015 which showed metastatic carcinoma consistent with transitional cell histology. She was referred to me for evaluation regarding these  findings. Clinically, she is asymptomatic from this. She is no longer reporting  any hematuria, dysuria or abdominal pain. She does not report any flank pain. Her performance status is limited related to arthritis. She ambulates short distances inside her house. She relies on her daughter for most activities of daily living."      INTERVAL HISTORY:  [name] presents today for follow-up for Stage IV urothelial carcinoma.  She is seen in a wheelchair, accompanied by her daughter today.   Overall patient states she feels well. She is here for cycle 8 of tecentriq. She has been tolerating tecentriq well without any side effects. She states she's had a runny nose with clear discharge for about a month and has been taking calritin without improvement in her symptoms. She denies any associated headaches, fevers/chills, cough. Her daughter states the patient's energy level has been improving and she wants to do more things. She has been eating well and has a good appetite.   REVIEW OF SYSTEMS:  Review of Systems  Constitutional: Negative for appetite change, chills, fatigue, fever and unexpected weight change.  HENT:  Negative.  Negative for mouth sores.   Eyes: Negative.   Respiratory: Negative.  Negative for cough and shortness of breath.   Cardiovascular: Negative for leg swelling.  Gastrointestinal: Negative for abdominal pain, blood in stool, constipation, diarrhea, nausea and vomiting.  Endocrine: Negative.   Genitourinary: Negative.  Negative for dysuria, hematuria and vaginal bleeding.   Musculoskeletal: Negative for back pain. Arthralgias: chronic arthritis   Skin: Negative.  Negative for rash.  Neurological: Negative for dizziness and headaches.  Hematological: Does not bruise/bleed easily.  Psychiatric/Behavioral: Negative.  Negative for sleep disturbance.  All other systems reviewed and are negative.   PAST MEDICAL/SURGICAL HISTORY:  Past Medical History:  Diagnosis Date  . Arthritis   . Back pain, chronic   . CAD (coronary artery disease)    NSTEMI 04/2011  with newly diagnosed 3V CAD s/p PCI/DES mLAD 04/11/11 with residual dz (per Dr. Burt Knack, should have consideration of PCI of the occluded RCA based on symptoms and/or consideration of outpatient nuclear perfusion testing after the patient recovers from her infarct)  . Cancer ([place])    kidney ( Nov.2017)  . Chronic kidney disease (CKD), stage IV (severe) ([place]) 12/13/2013  . Hyperglycemia   . Hyperlipidemia   . Hypertension   . Hypothyroidism   . Ischemic cardiomyopathy    EF 45% by cath 04/20/11, 50-55% by echo 04/22/11. hypotension limiting medication titration   . Myocardial infarction ([place]) 2013  . Type 2 diabetes with nephropathy ([place]) 07/26/2014  . UTI (urinary tract infection) 12/13/2013   Past Surgical History:  Procedure Laterality Date  . BACK SURGERY    . BREAST LUMPECTOMY     right  . CORONARY STENT PLACEMENT    . HIP ARTHROPLASTY Right 04/29/2016   Procedure: ARTHROPLASTY BIPOLAR HIP (HEMIARTHROPLASTY);  Surgeon: Carole Civil, MD;  Location: [facility];  Service: Orthopedics;  Laterality: Right;  . LEFT HEART CATHETERIZATION WITH CORONARY ANGIOGRAM N/A 04/20/2011   Procedure: LEFT HEART CATHETERIZATION WITH CORONARY ANGIOGRAM;  Surgeon: Burnell Blanks, MD;  Location: [facility] CATH LAB;  Service: Cardiovascular;  Laterality: N/A;  . PERCUTANEOUS CORONARY STENT INTERVENTION (PCI-S) N/A 04/21/2011   Procedure: PERCUTANEOUS CORONARY STENT INTERVENTION (PCI-S);  Surgeon: Sherren Mocha, MD;  Location: [facility] CATH LAB;  Service: Cardiovascular;  Laterality: N/A;     SOCIAL HISTORY:  Social History   Social History  . Marital status: Married  Spouse name: N/A  . Number of children: N/A  . Years of education: N/A   Occupational History  . Not on file.   Social History Main Topics  . Smoking status: Never Smoker  . Smokeless tobacco: Never Used  . Alcohol use No  . Drug use: No  . Sexual activity: No     Comment: married 61 years-husband at [employer]   Other Topics Concern  .  Not on file   Social History Narrative   Lives in [place], [place] with her husband and daughter.    FAMILY HISTORY:  Family History  Problem Relation Age of Onset  . Other Mother   . Cancer Father   . Other Unknown        no known family cardiac disease  . Other Brother        Diptheria  . Depression Sister   . Pneumonia Sister   . Other Brother        murdered    CURRENT MEDICATIONS:  Outpatient Encounter Prescriptions as of 09/08/2016  Medication Sig Note  . amLODipine (NORVASC) 5 MG tablet Take 1 tablet (5 mg total) by mouth daily.   Marland Kitchen aspirin EC 325 MG EC tablet Take 1 tablet (325 mg total) by mouth daily with breakfast. Continue with 325 mg oral daily for total of 30 days for DVT prophylaxis, then go back to aspirin 81 mg oral daily   . docusate sodium (COLACE) 100 MG capsule Take 1 capsule (100 mg total) by mouth 2 (two) times daily. (Patient taking differently: Take 100 mg by mouth every other day. )   . feeding supplement, ENSURE ENLIVE, (ENSURE ENLIVE) LIQD Take 237 mLs by mouth 2 (two) times daily between meals.   . fish oil-omega-3 fatty acids 1000 MG capsule Take 1 g by mouth daily.   Marland Kitchen HYDROcodone-acetaminophen (NORCO/VICODIN) 5-325 MG tablet Take 1 tablet by mouth every 6 (six) hours as needed for moderate pain.   Marland Kitchen levothyroxine (SYNTHROID, LEVOTHROID) 112 MCG tablet Take 1 tablet (112 mcg total) by mouth daily before breakfast. Alternating with 75 mcg   . levothyroxine (SYNTHROID, LEVOTHROID) 75 MCG tablet TAKE ONE TABLET EVERY MORNING   . meclizine (ANTIVERT) 25 MG tablet Take 25 mg by mouth 4 (four) times daily as needed. Dizziness   . metoprolol tartrate (LOPRESSOR) 25 MG tablet Take 1 tablet (25 mg total) by mouth 2 (two) times daily. (Patient taking differently: Take 12.5 mg by mouth 2 (two) times daily. ) 07/28/2016: 07/28/16-Takes 1/2 pill daily  . nitrofurantoin, macrocrystal-monohydrate, (MACROBID) 100 MG capsule Take 1 capsule (100 mg total) by mouth daily.   .  nitroGLYCERIN (NITROSTAT) 0.4 MG SL tablet Place 0.4 mg under the tongue every 5 (five) minutes x 3 doses as needed. For chest pain. 04/04/2014: Has not taken  . ondansetron (ZOFRAN) 8 MG tablet Take 1 tablet (8 mg total) by mouth every 8 (eight) hours as needed for nausea or vomiting.   . pantoprazole (PROTONIX) 40 MG tablet Take 1 tablet (40 mg total) by mouth daily.   . potassium chloride (K-DUR) 10 MEQ tablet TAKE 4 TABLETS DAILY   . prochlorperazine (COMPAZINE) 10 MG tablet Take 1 tablet (10 mg total) by mouth every 6 (six) hours as needed for nausea or vomiting.   . senna-docusate (SENOKOT-S) 8.6-50 MG tablet Take 1 tablet by mouth at bedtime as needed for mild constipation.   . Vitamin D, Ergocalciferol, (DRISDOL) 50000 UNITS CAPS Take 50,000 Units by mouth 2 (two) times  a week. Tuesdays and Thurdays    No facility-administered encounter medications on file as of 09/08/2016.      ALLERGIES:  Allergies  Allergen Reactions  . Ciprofloxacin Other (See Comments)    Hallucination  . Lipitor [Atorvastatin] Other (See Comments)    Muscle Weakness  . Penicillins Rash    Has patient had a PCN reaction causing immediate rash, facial/tongue/throat swelling, SOB or lightheadedness with hypotension: unknown Has patient had a PCN reaction causing severe rash involving mucus membranes or skin necrosis: unknown Has patient had a PCN reaction that required hospitalization: unknown Has patient had a PCN reaction occurring within the last 10 years: unknown If all of the above answers are "NO", then may proceed with Cephalosporin use. 2     PHYSICAL EXAM:  ECOG Performance status: 2 - Symptomatic; requires assistance   Vitals:   09/08/16 0901  BP: (!) 136/38  Pulse: (!) 56  Resp: 18  SpO2: 98%   Filed Weights   09/08/16 0901  Weight: 167 lb (75.8 kg)    Physical Exam  Constitutional: She is oriented to person, place, and time and well-developed, well-nourished, and in no distress. No  distress.  Exam done in wheelchair.   HENT:  Head: Normocephalic and atraumatic.  Mouth/Throat: Oropharynx is clear and moist. No oropharyngeal exudate.  She is mildly hard of hearing   Eyes: Pupils are equal, round, and reactive to light. Conjunctivae are normal. No scleral icterus.  Neck: Normal range of motion. Neck supple. No JVD present.  Cardiovascular: Normal rate, regular rhythm and normal heart sounds.  Exam reveals no gallop and no friction rub.   No murmur heard. Pulmonary/Chest: Effort normal and breath sounds normal. No respiratory distress. She has no wheezes. She has no rales.  Abdominal: Soft. Bowel sounds are normal. She exhibits no distension. There is no tenderness. There is no rebound and no guarding.  Musculoskeletal: Normal range of motion. She exhibits no edema or tenderness.  Lymphadenopathy:    She has no cervical adenopathy.  Neurological: She is alert and oriented to person, place, and time. No cranial nerve deficit.  Skin: Skin is warm and dry. No rash noted. No erythema. No pallor.  Psychiatric: Mood, memory, affect and judgment normal.  Nursing note and vitals reviewed.   LABORATORY DATA:  I have reviewed the labs as listed.  CBC    Component Value Date/Time   WBC 5.6 09/08/2016 0832   RBC 3.27 (L) 09/08/2016 0832   HGB 10.5 (L) 09/08/2016 0832   HCT 31.4 (L) 09/08/2016 0832   PLT 183 09/08/2016 0832   MCV 96.0 09/08/2016 0832   MCH 32.1 09/08/2016 0832   MCHC 33.4 09/08/2016 0832   RDW 13.8 09/08/2016 0832   LYMPHSABS 1.1 09/08/2016 0832   MONOABS 0.7 09/08/2016 0832   EOSABS 0.2 09/08/2016 0832   BASOSABS 0.0 09/08/2016 0832   CMP Latest Ref Rng & Units 09/06/2016 08/18/2016 07/28/2016  Glucose 65 - 99 mg/dL 174(H) 169(H) 233(H)  BUN 6 - 20 mg/dL 23(H) 29(H) 29(H)  Creatinine 0.44 - 1.00 mg/dL 1.48(H) 1.84(H) 1.76(H)  Sodium 135 - 145 mmol/L 130(L) 136 134(L)  Potassium 3.5 - 5.1 mmol/L 4.1 3.9 3.8  Chloride 101 - 111 mmol/L 99(L) 102 103    CO2 22 - 32 mmol/L 25 25 25   Calcium 8.9 - 10.3 mg/dL 8.4(L) 8.9 8.7(L)  Total Protein 6.5 - 8.1 g/dL - 6.3(L) 6.1(L)  Total Bilirubin 0.3 - 1.2 mg/dL - 0.6 0.5  Alkaline Phos  38 - 126 U/L - 95 123  AST 15 - 41 U/L - 20 31  ALT 14 - 54 U/L - 17 40    PENDING LABS:    DIAGNOSTIC IMAGING:  MRI abdomen: 11/19/15     PATHOLOGY:  Liver biopsy: 12/07/15       ASSESSMENT & PLAN:   Stage IV Urothelial carcinoma, multifocal R ureteral masses with liver metastases: -Patient tolerating her treatments well and has improved quality of life with her treatments. Labs reviewed with the patient, proceed with cycle 8 of tecentriq. -Reviewed her restaging CT scan results 09/06/16 with her and her daughter in detail. She has overall improvement. The bulky tumor in the right collecting system and right proximal ureter is mildly reduced. Prior pulmonary nodules are no longer visible. Continued reduction in size of the segment 4b hepatic metastatic lesion which also demonstrates reduced conspicuity.  Return to clinic in 3 weeks for follow-up and her next treatment.   Twana First, MD

## 2016-09-08 NOTE — Progress Notes (Signed)
Patient tolerated treatment without incidence. Patient discharged via wheelchair and in stable condition from clinic with daughter to home. Patient to follow up as scheduled.

## 2016-09-29 ENCOUNTER — Encounter (HOSPITAL_COMMUNITY): Payer: Self-pay

## 2016-09-29 ENCOUNTER — Encounter (HOSPITAL_BASED_OUTPATIENT_CLINIC_OR_DEPARTMENT_OTHER): Payer: Medicare Other | Admitting: Oncology

## 2016-09-29 ENCOUNTER — Encounter (HOSPITAL_COMMUNITY): Payer: Self-pay | Admitting: Oncology

## 2016-09-29 ENCOUNTER — Encounter (HOSPITAL_COMMUNITY): Payer: Medicare Other

## 2016-09-29 ENCOUNTER — Encounter (HOSPITAL_BASED_OUTPATIENT_CLINIC_OR_DEPARTMENT_OTHER): Payer: Medicare Other

## 2016-09-29 VITALS — BP 152/47 | HR 54 | Temp 97.6°F | Resp 18

## 2016-09-29 VITALS — BP 141/44 | HR 63 | Resp 16 | Ht 62.0 in | Wt 167.0 lb

## 2016-09-29 DIAGNOSIS — C689 Malignant neoplasm of urinary organ, unspecified: Secondary | ICD-10-CM

## 2016-09-29 DIAGNOSIS — I10 Essential (primary) hypertension: Secondary | ICD-10-CM

## 2016-09-29 DIAGNOSIS — N184 Chronic kidney disease, stage 4 (severe): Secondary | ICD-10-CM | POA: Diagnosis not present

## 2016-09-29 DIAGNOSIS — C787 Secondary malignant neoplasm of liver and intrahepatic bile duct: Secondary | ICD-10-CM

## 2016-09-29 DIAGNOSIS — Z5112 Encounter for antineoplastic immunotherapy: Secondary | ICD-10-CM | POA: Diagnosis not present

## 2016-09-29 DIAGNOSIS — Z23 Encounter for immunization: Secondary | ICD-10-CM | POA: Diagnosis not present

## 2016-09-29 LAB — CBC WITH DIFFERENTIAL/PLATELET
BASOS PCT: 0 %
Basophils Absolute: 0 10*3/uL (ref 0.0–0.1)
Eosinophils Absolute: 0.2 10*3/uL (ref 0.0–0.7)
Eosinophils Relative: 4 %
HEMATOCRIT: 32 % — AB (ref 36.0–46.0)
HEMOGLOBIN: 10.6 g/dL — AB (ref 12.0–15.0)
Lymphocytes Relative: 20 %
Lymphs Abs: 1.1 10*3/uL (ref 0.7–4.0)
MCH: 31.9 pg (ref 26.0–34.0)
MCHC: 33.1 g/dL (ref 30.0–36.0)
MCV: 96.4 fL (ref 78.0–100.0)
MONOS PCT: 11 %
Monocytes Absolute: 0.6 10*3/uL (ref 0.1–1.0)
NEUTROS ABS: 3.6 10*3/uL (ref 1.7–7.7)
NEUTROS PCT: 65 %
PLATELETS: 201 10*3/uL (ref 150–400)
RBC: 3.32 MIL/uL — AB (ref 3.87–5.11)
RDW: 13.5 % (ref 11.5–15.5)
WBC: 5.5 10*3/uL (ref 4.0–10.5)

## 2016-09-29 LAB — COMPREHENSIVE METABOLIC PANEL
ALBUMIN: 3.2 g/dL — AB (ref 3.5–5.0)
ALT: 14 U/L (ref 14–54)
AST: 17 U/L (ref 15–41)
Alkaline Phosphatase: 92 U/L (ref 38–126)
Anion gap: 11 (ref 5–15)
BUN: 29 mg/dL — ABNORMAL HIGH (ref 6–20)
CHLORIDE: 96 mmol/L — AB (ref 101–111)
CO2: 24 mmol/L (ref 22–32)
Calcium: 8.9 mg/dL (ref 8.9–10.3)
Creatinine, Ser: 1.84 mg/dL — ABNORMAL HIGH (ref 0.44–1.00)
GFR calc Af Amer: 28 mL/min — ABNORMAL LOW (ref >60)
GFR, EST NON AFRICAN AMERICAN: 24 mL/min — AB (ref >60)
Glucose, Bld: 260 mg/dL — ABNORMAL HIGH (ref 65–99)
POTASSIUM: 4.1 mmol/L (ref 3.5–5.1)
SODIUM: 131 mmol/L — AB (ref 135–145)
Total Bilirubin: 0.7 mg/dL (ref 0.3–1.2)
Total Protein: 6.7 g/dL (ref 6.5–8.1)

## 2016-09-29 MED ORDER — SODIUM CHLORIDE 0.9 % IV SOLN
1200.0000 mg | Freq: Once | INTRAVENOUS | Status: AC
  Administered 2016-09-29: 1200 mg via INTRAVENOUS
  Filled 2016-09-29: qty 20

## 2016-09-29 MED ORDER — SODIUM CHLORIDE 0.9 % IV SOLN
Freq: Once | INTRAVENOUS | Status: AC
  Administered 2016-09-29: 10:00:00 via INTRAVENOUS

## 2016-09-29 MED ORDER — INFLUENZA VAC SPLIT QUAD 0.5 ML IM SUSY
0.5000 mL | PREFILLED_SYRINGE | Freq: Once | INTRAMUSCULAR | Status: AC
  Administered 2016-09-29: 0.5 mL via INTRAMUSCULAR
  Filled 2016-09-29: qty 0.5

## 2016-09-29 NOTE — Progress Notes (Signed)
6047 Labs reviewed with Dr. Talbert Cage and pt approved for Tecentriq infusion today as well as Flu vaccine.                Joann Hunter tolerated Tecentriq infusion and flu vaccine well without complaints or incident. VSS upon discharge. Pt discharged via wheelchair in satisfactory condition accompanied by her daughter

## 2016-09-29 NOTE — Patient Instructions (Addendum)
[facility] Discharge Instructions for Patients Receiving Chemotherapy   Beginning January 23rd 2017 lab work for the [facility] will be done in the  Main lab at [facility] on 1st floor. If you have a lab appointment with the [place] please come in thru the  Main Entrance and check in at the main information desk   Today you received the following chemotherapy agents Tecentriq as well as Influenza vaccine Follow-up as scheduled. Call clinic for any questions or concerns  To help prevent nausea and vomiting after your treatment, we encourage you to take your nausea medication   If you develop nausea and vomiting, or diarrhea that is not controlled by your medication, call the clinic.  The clinic phone number is [phone]. Office hours are Monday-Friday 8:30am-5:00pm.  BELOW ARE SYMPTOMS THAT SHOULD BE REPORTED IMMEDIATELY:  *FEVER GREATER THAN 101.0 F  *CHILLS WITH OR WITHOUT FEVER  NAUSEA AND VOMITING THAT IS NOT CONTROLLED WITH YOUR NAUSEA MEDICATION  *UNUSUAL SHORTNESS OF BREATH  *UNUSUAL BRUISING OR BLEEDING  TENDERNESS IN MOUTH AND THROAT WITH OR WITHOUT PRESENCE OF ULCERS  *URINARY PROBLEMS  *BOWEL PROBLEMS  UNUSUAL RASH Items with * indicate a potential emergency and should be followed up as soon as possible. If you have an emergency after office hours please contact your primary care physician or go to the nearest emergency department.  Please call the clinic during office hours if you have any questions or concerns.   You may also contact the Patient Navigator at [phone] should you have any questions or need assistance in obtaining follow up care.      Resources For Cancer Patients and their Caregivers ? American Cancer Society: Can assist with transportation, wigs, general needs, runs Look Good Feel Better.        367-368-2511 ? Cancer Care: Provides financial assistance, online support groups, medication/co-pay  assistance.  1-800-813-HOPE 5128263040) ? [place] Assists [place] cancer patients and their families through emotional , educational and financial support.  (364)457-7483 ? [place] DSS Where to apply for food stamps, Medicaid and utility assistance. (250)021-3931 ? RCATS: Transportation to medical appointments. 978-137-0493 ? Social Security Administration: May apply for disability if have a Stage IV cancer. 615-735-0129 816-088-5748 ? [employer], Disability and Transit Services: Assists with nutrition, care and transit needs. 4345865494

## 2016-09-29 NOTE — Progress Notes (Signed)
[place], [place] [address]   CLINIC:  Medical Oncology/Hematology  PCP:  Joann Blitz, MD [address] [place] [address] [phone]   REASON FOR VISIT:  Follow-up for Stage IV Urothelial Carcinoma with liver mets  CURRENT THERAPY: Tecentriq IV every 21 days, beginning on 02/04/16   BRIEF ONCOLOGIC HISTORY:    Urothelial cancer ([facility])   10/22/2015 Imaging    Presented with recurrent UTI and hematuria. Underwent cystoscopy and retrograde evaluation with Dr. Exie Hunter revealed multiple filling defects in distal (R) ureter. Marked irregularly of proximal (R) ureter concerning for malignancy. (+) hydronephrosis noted and tumor noted out of ureteral orifice.        11/19/2015 Imaging    MRI abdomen: 3.2 cm mass in segment 4B of left hepatic lobe, suspicious for liver metastasis. Moderate right hydronephrosis and diffuse renal parenchymal atrophy. Abnormal signal intensity throughout right renal collecting system and proximal right ureter, consistent with known urothelial carcinoma. No other sites of abdominal metastatic disease identified.      12/07/2015 Procedure    Liver biopsy: Poorly differentiated carcinoma consistent with metastatic transitional cell histology.       01/01/2016 Initial Diagnosis    Urothelial carcinoma ([place])      02/04/2016 -  Chemotherapy    atezolizumab (TECENTRIQ) 1,200 mg IV every 21 days        06/09/2016 Imaging    CT C/A/P: Interval decrease in size of hepatic metastatic lesion.  Interval decrease in distention of the right renal collecting system and right ureter with central high attenuation material compatible with known primary urothelial malignancy.  Bilateral pulmonary nodules as above. Recommend attention on follow-up.  Subacute right-sided rib fractures.      09/06/2016 Imaging    CT C/A/P: 1. Overall improvement. The bulky tumor in the right collecting system and right proximal ureter is  mildly reduced. Prior pulmonary nodules are no longer visible. Continued reduction in size of the segment 4b hepatic metastatic lesion which also demonstrates reduced conspicuity. 2. Other imaging findings of potential clinical significance: Aortic Atherosclerosis (ICD10-I70.0). Healing response from prior right posterior rib fractures. Hepatic cirrhosis. Gastric varices and splenorenal shunting indicating portal venous hypertension. Severe degenerative left hip arthropathy. Chronic compression fracture at L2. Bony demineralization.       HISTORY OF PRESENT ILLNESS (From Dr. Donald Hunter note on 01/27/2016):  Joann Hunter 83 y.o. femaleis here because of referral from Dr. Alen Hunter for ureteral cancer. The patient lives closer to [facility] and felt it would be more convenient for her and her family to follow-up in [place].  Per Dr. Hazeline Hunter note on 12/18/2015: "83 year old woman with history of hypertension, hyperlipidemia and coronary artery disease. She has been noted to have recurrent UTI and hematuria but subsequently underwent a cystoscopy and retrograde evaluation by Dr. Exie Hunter in October 2017. Retrograde evaluation showed multiple filling defects in the distal right ureter. There is marked irregularity of the proximal right ureter specialist for malignant process. She also had an ultrasound which showed hydronephrosis and her cystoscopy on 10/22/2015 showed a tumor noted out of the ureteral orifice. MRI of the abdomen obtained on 11/19/2015 showed a 3.2 cm mass of the left hepatic lobe suspicious for liver metastasis. Moderate right hydronephrosis was also noted. No other sites of abdominal metastasis noted. She underwent a biopsy of her liver lesion on 12/07/2015 which showed metastatic carcinoma consistent with transitional cell histology. She was referred to me for evaluation regarding these  findings. Clinically, she is asymptomatic from this. She is no longer reporting  any hematuria, dysuria or abdominal pain. She does not report any flank pain. Her performance status is limited related to arthritis. She ambulates short distances inside her house. She relies on her daughter for most activities of daily living."      INTERVAL HISTORY:  Joann Hunter presents today for follow-up for Stage IV urothelial carcinoma.  She is seen in a wheelchair, accompanied by her daughter today.   Overall patient states she feels well. She is here for cycle 9 of tecentriq. She has been tolerating tecentriq well without any side effects. She has some fatigue today but is otherwise without comoplaints. Overall she states she feels well. She denies any chest pain, shortness of breath, abdominal pain, focal weakness.  She has been eating well and has a good appetite.   REVIEW OF SYSTEMS:  Review of Systems  Constitutional: Positive for fatigue. Negative for appetite change, chills, fever and unexpected weight change.  HENT:  Negative.  Negative for mouth sores.   Eyes: Negative.   Respiratory: Negative.  Negative for cough and shortness of breath.   Cardiovascular: Negative for leg swelling.  Gastrointestinal: Negative for abdominal pain, blood in stool, constipation, diarrhea, nausea and vomiting.  Endocrine: Negative.   Genitourinary: Negative.  Negative for dysuria, hematuria and vaginal bleeding.   Musculoskeletal: Negative for back pain. Arthralgias: chronic arthritis   Skin: Negative.  Negative for rash.  Neurological: Negative for dizziness and headaches.  Hematological: Does not bruise/bleed easily.  Psychiatric/Behavioral: Negative.  Negative for sleep disturbance.  All other systems reviewed and are negative.   PAST MEDICAL/SURGICAL HISTORY:  Past Medical History:  Diagnosis Date  . Arthritis   . Back pain, chronic   . CAD (coronary artery disease)    NSTEMI 04/2011 with newly diagnosed 3V CAD s/p PCI/DES mLAD 04/11/11 with residual dz (per Dr. Burt Knack, should have  consideration of PCI of the occluded RCA based on symptoms and/or consideration of outpatient nuclear perfusion testing after the patient recovers from her infarct)  . Cancer ([facility])    kidney ( Nov.2017)  . Chronic kidney disease (CKD), stage IV (severe) ([facility]) 12/13/2013  . Hyperglycemia   . Hyperlipidemia   . Hypertension   . Hypothyroidism   . Ischemic cardiomyopathy    EF 45% by cath 04/20/11, 50-55% by echo 04/22/11. hypotension limiting medication titration   . Myocardial infarction ([facility]) 2013  . Type 2 diabetes with nephropathy ([facility]) 07/26/2014  . UTI (urinary tract infection) 12/13/2013   Past Surgical History:  Procedure Laterality Date  . BACK SURGERY    . BREAST LUMPECTOMY     right  . CORONARY STENT PLACEMENT    . HIP ARTHROPLASTY Right 04/29/2016   Procedure: ARTHROPLASTY BIPOLAR HIP (HEMIARTHROPLASTY);  Surgeon: Carole Civil, MD;  Location: [facility];  Service: Orthopedics;  Laterality: Right;  . LEFT HEART CATHETERIZATION WITH CORONARY ANGIOGRAM N/A 04/20/2011   Procedure: LEFT HEART CATHETERIZATION WITH CORONARY ANGIOGRAM;  Surgeon: Burnell Blanks, MD;  Location: [facility] CATH LAB;  Service: Cardiovascular;  Laterality: N/A;  . PERCUTANEOUS CORONARY STENT INTERVENTION (PCI-S) N/A 04/21/2011   Procedure: PERCUTANEOUS CORONARY STENT INTERVENTION (PCI-S);  Surgeon: Sherren Mocha, MD;  Location: [facility] CATH LAB;  Service: Cardiovascular;  Laterality: N/A;     SOCIAL HISTORY:  Social History   Social History  . Marital status: Married    Spouse name: N/A  . Number of children: N/A  . Years of education: N/A  Occupational History  . Not on file.   Social History Main Topics  . Smoking status: Never Smoker  . Smokeless tobacco: Never Used  . Alcohol use No  . Drug use: No  . Sexual activity: No     Comment: married 61 years-husband at [employer]   Other Topics Concern  . Not on file   Social History Narrative   Lives in [place], [place] with her husband and daughter.      FAMILY HISTORY:  Family History  Problem Relation Age of Onset  . Other Mother   . Cancer Father   . Other Unknown        no known family cardiac disease  . Other Brother        Diptheria  . Depression Sister   . Pneumonia Sister   . Other Brother        murdered    CURRENT MEDICATIONS:  Outpatient Encounter Prescriptions as of 09/29/2016  Medication Sig Note  . amLODipine (NORVASC) 5 MG tablet Take 1 tablet (5 mg total) by mouth daily.   Marland Kitchen aspirin EC 325 MG EC tablet Take 1 tablet (325 mg total) by mouth daily with breakfast. Continue with 325 mg oral daily for total of 30 days for DVT prophylaxis, then go back to aspirin 81 mg oral daily   . docusate sodium (COLACE) 100 MG capsule Take 1 capsule (100 mg total) by mouth 2 (two) times daily. (Patient taking differently: Take 100 mg by mouth every other day. )   . feeding supplement, ENSURE ENLIVE, (ENSURE ENLIVE) LIQD Take 237 mLs by mouth 2 (two) times daily between meals.   . fish oil-omega-3 fatty acids 1000 MG capsule Take 1 g by mouth daily.   Marland Kitchen HYDROcodone-acetaminophen (NORCO/VICODIN) 5-325 MG tablet Take 1 tablet by mouth every 6 (six) hours as needed for moderate pain.   Marland Kitchen levothyroxine (SYNTHROID, LEVOTHROID) 112 MCG tablet Take 1 tablet (112 mcg total) by mouth daily before breakfast.   . meclizine (ANTIVERT) 25 MG tablet Take 25 mg by mouth 4 (four) times daily as needed. Dizziness   . metoprolol tartrate (LOPRESSOR) 25 MG tablet Take 1 tablet (25 mg total) by mouth 2 (two) times daily. (Patient taking differently: Take 12.5 mg by mouth 2 (two) times daily. ) 07/28/2016: 07/28/16-Takes 1/2 pill daily  . nitrofurantoin, macrocrystal-monohydrate, (MACROBID) 100 MG capsule Take 1 capsule (100 mg total) by mouth daily.   . nitroGLYCERIN (NITROSTAT) 0.4 MG SL tablet Place 0.4 mg under the tongue every 5 (five) minutes x 3 doses as needed. For chest pain. 04/04/2014: Has not taken  . ondansetron (ZOFRAN) 8 MG tablet Take 1 tablet  (8 mg total) by mouth every 8 (eight) hours as needed for nausea or vomiting.   . pantoprazole (PROTONIX) 40 MG tablet Take 1 tablet (40 mg total) by mouth daily.   . potassium chloride (K-DUR) 10 MEQ tablet TAKE 4 TABLETS DAILY   . prochlorperazine (COMPAZINE) 10 MG tablet Take 1 tablet (10 mg total) by mouth every 6 (six) hours as needed for nausea or vomiting.   . senna-docusate (SENOKOT-S) 8.6-50 MG tablet Take 1 tablet by mouth at bedtime as needed for mild constipation.   . Vitamin D, Ergocalciferol, (DRISDOL) 50000 UNITS CAPS Take 50,000 Units by mouth 2 (two) times a week. Tuesdays and Thurdays    No facility-administered encounter medications on file as of 09/29/2016.      ALLERGIES:  Allergies  Allergen Reactions  . Ciprofloxacin Other (See Comments)  Hallucination  . Lipitor [Atorvastatin] Other (See Comments)    Muscle Weakness  . Penicillins Rash    Has patient had a PCN reaction causing immediate rash, facial/tongue/throat swelling, SOB or lightheadedness with hypotension: unknown Has patient had a PCN reaction causing severe rash involving mucus membranes or skin necrosis: unknown Has patient had a PCN reaction that required hospitalization: unknown Has patient had a PCN reaction occurring within the last 10 years: unknown If all of the above answers are "NO", then may proceed with Cephalosporin use. 2     PHYSICAL EXAM:  ECOG Performance status: 2 - Symptomatic; requires assistance   There were no vitals filed for this visit. There were no vitals filed for this visit.  Physical Exam  Constitutional: She is oriented to person, place, and time and well-developed, well-nourished, and in no distress. No distress.  Exam done in wheelchair.   HENT:  Head: Normocephalic and atraumatic.  Mouth/Throat: Oropharynx is clear and moist. No oropharyngeal exudate.  She is mildly hard of hearing   Eyes: Pupils are equal, round, and reactive to light. Conjunctivae are normal.  No scleral icterus.  Neck: Normal range of motion. Neck supple. No JVD present.  Cardiovascular: Normal rate, regular rhythm and normal heart sounds.  Exam reveals no gallop and no friction rub.   No murmur heard. Pulmonary/Chest: Effort normal and breath sounds normal. No respiratory distress. She has no wheezes. She has no rales.  Abdominal: Soft. Bowel sounds are normal. She exhibits no distension. There is no tenderness. There is no rebound and no guarding.  Musculoskeletal: Normal range of motion. She exhibits no edema or tenderness.  Lymphadenopathy:    She has no cervical adenopathy.  Neurological: She is alert and oriented to person, place, and time. No cranial nerve deficit.  Skin: Skin is warm and dry. No rash noted. No erythema. No pallor.  Psychiatric: Mood, memory, affect and judgment normal.  Nursing note and vitals reviewed.   LABORATORY DATA:  I have reviewed the labs as listed.  CBC    Component Value Date/Time   WBC 5.6 09/08/2016 0832   RBC 3.27 (L) 09/08/2016 0832   HGB 10.5 (L) 09/08/2016 0832   HCT 31.4 (L) 09/08/2016 0832   PLT 183 09/08/2016 0832   MCV 96.0 09/08/2016 0832   MCH 32.1 09/08/2016 0832   MCHC 33.4 09/08/2016 0832   RDW 13.8 09/08/2016 0832   LYMPHSABS 1.1 09/08/2016 0832   MONOABS 0.7 09/08/2016 0832   EOSABS 0.2 09/08/2016 0832   BASOSABS 0.0 09/08/2016 0832   CMP Latest Ref Rng & Units 09/08/2016 09/06/2016 08/18/2016  Glucose 65 - 99 mg/dL 242(H) 174(H) 169(H)  BUN 6 - 20 mg/dL 19 23(H) 29(H)  Creatinine 0.44 - 1.00 mg/dL 1.57(H) 1.48(H) 1.84(H)  Sodium 135 - 145 mmol/L 132(L) 130(L) 136  Potassium 3.5 - 5.1 mmol/L 3.8 4.1 3.9  Chloride 101 - 111 mmol/L 100(L) 99(L) 102  CO2 22 - 32 mmol/L 23 25 25   Calcium 8.9 - 10.3 mg/dL 8.5(L) 8.4(L) 8.9  Total Protein 6.5 - 8.1 g/dL 6.1(L) - 6.3(L)  Total Bilirubin 0.3 - 1.2 mg/dL 0.5 - 0.6  Alkaline Phos 38 - 126 U/L 103 - 95  AST 15 - 41 U/L 32 - 20  ALT 14 - 54 U/L 29 - 17    PENDING LABS:      DIAGNOSTIC IMAGING:  MRI abdomen: 11/19/15     PATHOLOGY:  Liver biopsy: 12/07/15       ASSESSMENT &  PLAN:   Stage IV Urothelial carcinoma, multifocal R ureteral masses with liver metastases: -Patient tolerating her treatments well and has improved quality of life with her treatments. Labs reviewed with the patient, proceed with cycle 9 of tecentriq. -Her restaging CT scan results 09/06/16 demonstrated overall improvement. The bulky tumor in the right collecting system and right proximal ureter is mildly reduced. Prior pulmonary nodules are no longer visible. Continued reduction in size of the segment 4b hepatic metastatic lesion which also demonstrates reduced conspicuity. -Annual flu shot given today at patient's request.  Return to clinic in 3 weeks for follow-up and her next treatment.   Twana First, MD

## 2016-10-20 ENCOUNTER — Encounter (HOSPITAL_COMMUNITY): Payer: Self-pay | Admitting: Oncology

## 2016-10-20 ENCOUNTER — Encounter (HOSPITAL_BASED_OUTPATIENT_CLINIC_OR_DEPARTMENT_OTHER): Payer: Medicare Other | Admitting: Oncology

## 2016-10-20 ENCOUNTER — Ambulatory Visit (HOSPITAL_COMMUNITY): Payer: Medicare Other

## 2016-10-20 ENCOUNTER — Encounter (HOSPITAL_COMMUNITY): Payer: Medicare Other | Attending: Oncology

## 2016-10-20 ENCOUNTER — Encounter (HOSPITAL_COMMUNITY): Payer: Self-pay

## 2016-10-20 ENCOUNTER — Encounter (HOSPITAL_BASED_OUTPATIENT_CLINIC_OR_DEPARTMENT_OTHER): Payer: Medicare Other

## 2016-10-20 VITALS — BP 160/51 | HR 57 | Temp 97.6°F | Resp 18 | Wt 166.8 lb

## 2016-10-20 VITALS — BP 133/46 | HR 57 | Temp 97.4°F | Resp 18

## 2016-10-20 DIAGNOSIS — I2510 Atherosclerotic heart disease of native coronary artery without angina pectoris: Secondary | ICD-10-CM | POA: Diagnosis not present

## 2016-10-20 DIAGNOSIS — I252 Old myocardial infarction: Secondary | ICD-10-CM | POA: Insufficient documentation

## 2016-10-20 DIAGNOSIS — C689 Malignant neoplasm of urinary organ, unspecified: Secondary | ICD-10-CM | POA: Diagnosis not present

## 2016-10-20 DIAGNOSIS — E039 Hypothyroidism, unspecified: Secondary | ICD-10-CM | POA: Diagnosis not present

## 2016-10-20 DIAGNOSIS — C669 Malignant neoplasm of unspecified ureter: Secondary | ICD-10-CM | POA: Diagnosis not present

## 2016-10-20 DIAGNOSIS — I255 Ischemic cardiomyopathy: Secondary | ICD-10-CM | POA: Diagnosis not present

## 2016-10-20 DIAGNOSIS — E785 Hyperlipidemia, unspecified: Secondary | ICD-10-CM | POA: Diagnosis not present

## 2016-10-20 DIAGNOSIS — C787 Secondary malignant neoplasm of liver and intrahepatic bile duct: Secondary | ICD-10-CM

## 2016-10-20 DIAGNOSIS — E1122 Type 2 diabetes mellitus with diabetic chronic kidney disease: Secondary | ICD-10-CM | POA: Diagnosis not present

## 2016-10-20 DIAGNOSIS — Z880 Allergy status to penicillin: Secondary | ICD-10-CM | POA: Insufficient documentation

## 2016-10-20 DIAGNOSIS — G8929 Other chronic pain: Secondary | ICD-10-CM | POA: Insufficient documentation

## 2016-10-20 DIAGNOSIS — I864 Gastric varices: Secondary | ICD-10-CM | POA: Insufficient documentation

## 2016-10-20 DIAGNOSIS — Z5189 Encounter for other specified aftercare: Secondary | ICD-10-CM | POA: Diagnosis present

## 2016-10-20 DIAGNOSIS — Z87440 Personal history of urinary (tract) infections: Secondary | ICD-10-CM | POA: Diagnosis not present

## 2016-10-20 DIAGNOSIS — N184 Chronic kidney disease, stage 4 (severe): Secondary | ICD-10-CM

## 2016-10-20 DIAGNOSIS — Z5112 Encounter for antineoplastic immunotherapy: Secondary | ICD-10-CM | POA: Diagnosis not present

## 2016-10-20 DIAGNOSIS — Z7982 Long term (current) use of aspirin: Secondary | ICD-10-CM | POA: Diagnosis not present

## 2016-10-20 DIAGNOSIS — Z955 Presence of coronary angioplasty implant and graft: Secondary | ICD-10-CM | POA: Diagnosis not present

## 2016-10-20 DIAGNOSIS — I129 Hypertensive chronic kidney disease with stage 1 through stage 4 chronic kidney disease, or unspecified chronic kidney disease: Secondary | ICD-10-CM | POA: Diagnosis not present

## 2016-10-20 LAB — CBC WITH DIFFERENTIAL/PLATELET
BASOS ABS: 0 10*3/uL (ref 0.0–0.1)
Basophils Relative: 1 %
EOS PCT: 3 %
Eosinophils Absolute: 0.2 10*3/uL (ref 0.0–0.7)
HCT: 33.7 % — ABNORMAL LOW (ref 36.0–46.0)
Hemoglobin: 11.1 g/dL — ABNORMAL LOW (ref 12.0–15.0)
LYMPHS PCT: 28 %
Lymphs Abs: 1.3 10*3/uL (ref 0.7–4.0)
MCH: 32.1 pg (ref 26.0–34.0)
MCHC: 32.9 g/dL (ref 30.0–36.0)
MCV: 97.4 fL (ref 78.0–100.0)
MONO ABS: 0.4 10*3/uL (ref 0.1–1.0)
MONOS PCT: 8 %
Neutro Abs: 2.7 10*3/uL (ref 1.7–7.7)
Neutrophils Relative %: 60 %
PLATELETS: 169 10*3/uL (ref 150–400)
RBC: 3.46 MIL/uL — ABNORMAL LOW (ref 3.87–5.11)
RDW: 13.4 % (ref 11.5–15.5)
WBC: 4.5 10*3/uL (ref 4.0–10.5)

## 2016-10-20 LAB — COMPREHENSIVE METABOLIC PANEL
ALT: 13 U/L — ABNORMAL LOW (ref 14–54)
ANION GAP: 8 (ref 5–15)
AST: 22 U/L (ref 15–41)
Albumin: 3.1 g/dL — ABNORMAL LOW (ref 3.5–5.0)
Alkaline Phosphatase: 80 U/L (ref 38–126)
BILIRUBIN TOTAL: 0.7 mg/dL (ref 0.3–1.2)
BUN: 30 mg/dL — ABNORMAL HIGH (ref 6–20)
CHLORIDE: 96 mmol/L — AB (ref 101–111)
CO2: 25 mmol/L (ref 22–32)
Calcium: 9.1 mg/dL (ref 8.9–10.3)
Creatinine, Ser: 1.82 mg/dL — ABNORMAL HIGH (ref 0.44–1.00)
GFR, EST AFRICAN AMERICAN: 29 mL/min — AB (ref >60)
GFR, EST NON AFRICAN AMERICAN: 25 mL/min — AB (ref >60)
Glucose, Bld: 242 mg/dL — ABNORMAL HIGH (ref 65–99)
POTASSIUM: 4.2 mmol/L (ref 3.5–5.1)
Sodium: 129 mmol/L — ABNORMAL LOW (ref 135–145)
TOTAL PROTEIN: 6.4 g/dL — AB (ref 6.5–8.1)

## 2016-10-20 MED ORDER — SODIUM CHLORIDE 0.9 % IV SOLN
1200.0000 mg | Freq: Once | INTRAVENOUS | Status: AC
  Administered 2016-10-20: 1200 mg via INTRAVENOUS
  Filled 2016-10-20: qty 20

## 2016-10-20 MED ORDER — SODIUM CHLORIDE 0.9 % IV SOLN
INTRAVENOUS | Status: AC
  Administered 2016-10-20: 10:00:00 via INTRAVENOUS

## 2016-10-20 MED ORDER — SODIUM CHLORIDE 0.9 % IV SOLN
Freq: Once | INTRAVENOUS | Status: AC
  Administered 2016-10-20: 11:00:00 via INTRAVENOUS

## 2016-10-20 NOTE — Patient Instructions (Addendum)
.  [facility] Discharge Instructions for Patients Receiving Chemotherapy   Beginning January 23rd 2017 lab work for the Cancer Center will be done in the  Main lab at [facility] on 1st floor. If you have a lab appointment with the Cancer Center please come in thru the  Main Entrance and check in at the main information desk   Today you received the following chemotherapy agents Tecentriq as well as hydration. Follow-up as scheduled. Call clinic for any questions or concerns  To help prevent nausea and vomiting after your treatment, we encourage you to take your nausea medication   If you develop nausea and vomiting, or diarrhea that is not controlled by your medication, call the clinic.  The clinic phone number is [phone]. Office hours are Monday-Friday 8:30am-5:00pm.  BELOW ARE SYMPTOMS THAT SHOULD BE REPORTED IMMEDIATELY:  *FEVER GREATER THAN 101.0 F  *CHILLS WITH OR WITHOUT FEVER  NAUSEA AND VOMITING THAT IS NOT CONTROLLED WITH YOUR NAUSEA MEDICATION  *UNUSUAL SHORTNESS OF BREATH  *UNUSUAL BRUISING OR BLEEDING  TENDERNESS IN MOUTH AND THROAT WITH OR WITHOUT PRESENCE OF ULCERS  *URINARY PROBLEMS  *BOWEL PROBLEMS  UNUSUAL RASH Items with * indicate a potential emergency and should be followed up as soon as possible. If you have an emergency after office hours please contact your primary care physician or go to the nearest emergency department.  Please call the clinic during office hours if you have any questions or concerns.   You may also contact the Patient Navigator at [phone] should you have any questions or need assistance in obtaining follow up care.      Resources For Cancer Patients and their Caregivers ? American Cancer Society: Can assist with transportation, wigs, general needs, runs Look Good Feel Better.        1-888-227-6333 ? Cancer Care: Provides financial assistance, online support groups, medication/co-pay assistance.   1-800-813-HOPE (4673) ? [facility] Assists [place] cancer patients and their families through emotional , educational and financial support.  336-427-4357 ? [place] DSS Where to apply for food stamps, Medicaid and utility assistance. 336-342-1394 ? [facility]: Transportation to medical appointments. 336-347-2287 ? Social Security Administration: May apply for disability if have a Stage IV cancer. 336-342-7796 1-800-772-1213 ? [place] [facility]ging, Disability and Transit Services: Assists with nutrition, care and transit needs. 336-349-2343         

## 2016-10-20 NOTE — Progress Notes (Signed)
[place], [place] [address]   CLINIC:  Medical Oncology/Hematology  PCP:  Joann Blitz, MD [address] [place] [address] [phone]   REASON FOR VISIT:  Follow-up for Stage IV Urothelial Carcinoma with liver mets  CURRENT THERAPY: Tecentriq IV every 21 days, beginning on 02/04/16   BRIEF ONCOLOGIC HISTORY:    Urothelial cancer ([place])   10/22/2015 Imaging    Presented with recurrent UTI and hematuria. Underwent cystoscopy and retrograde evaluation with Dr. Exie Hunter revealed multiple filling defects in distal (R) ureter. Marked irregularly of proximal (R) ureter concerning for malignancy. (+) hydronephrosis noted and tumor noted out of ureteral orifice.        11/19/2015 Imaging    MRI abdomen: 3.2 cm mass in segment 4B of left hepatic lobe, suspicious for liver metastasis. Moderate right hydronephrosis and diffuse renal parenchymal atrophy. Abnormal signal intensity throughout right renal collecting system and proximal right ureter, consistent with known urothelial carcinoma. No other sites of abdominal metastatic disease identified.      12/07/2015 Procedure    Liver biopsy: Poorly differentiated carcinoma consistent with metastatic transitional cell histology.       01/01/2016 Initial Diagnosis    Urothelial carcinoma ([place])      02/04/2016 -  Chemotherapy    atezolizumab (TECENTRIQ) 1,200 mg IV every 21 days        06/09/2016 Imaging    CT C/A/P: Interval decrease in size of hepatic metastatic lesion.  Interval decrease in distention of the right renal collecting system and right ureter with central high attenuation material compatible with known primary urothelial malignancy.  Bilateral pulmonary nodules as above. Recommend attention on follow-up.  Subacute right-sided rib fractures.      09/06/2016 Imaging    CT C/A/P: 1. Overall improvement. The bulky tumor in the right collecting system and right proximal ureter is  mildly reduced. Prior pulmonary nodules are no longer visible. Continued reduction in size of the segment 4b hepatic metastatic lesion which also demonstrates reduced conspicuity. 2. Other imaging findings of potential clinical significance: Aortic Atherosclerosis (ICD10-I70.0). Healing response from prior right posterior rib fractures. Hepatic cirrhosis. Gastric varices and splenorenal shunting indicating portal venous hypertension. Severe degenerative left hip arthropathy. Chronic compression fracture at L2. Bony demineralization.       HISTORY OF PRESENT ILLNESS (From Dr. Donald Hunter note on 01/27/2016):  Joann Hunter 83 y.o. femaleis here because of referral from Dr. Alen Hunter for ureteral cancer. The patient lives closer to [facility] and felt it would be more convenient for her and her family to follow-up in [place].  Per Dr. Hazeline Hunter note on 12/18/2015: "83 year old woman with history of hypertension, hyperlipidemia and coronary artery disease. She has been noted to have recurrent UTI and hematuria but subsequently underwent a cystoscopy and retrograde evaluation by Dr. Exie Hunter in October 2017. Retrograde evaluation showed multiple filling defects in the distal right ureter. There is marked irregularity of the proximal right ureter specialist for malignant process. She also had an ultrasound which showed hydronephrosis and her cystoscopy on 10/22/2015 showed a tumor noted out of the ureteral orifice. MRI of the abdomen obtained on 11/19/2015 showed a 3.2 cm mass of the left hepatic lobe suspicious for liver metastasis. Moderate right hydronephrosis was also noted. No other sites of abdominal metastasis noted. She underwent a biopsy of her liver lesion on 12/07/2015 which showed metastatic carcinoma consistent with transitional cell histology. She was referred to me for evaluation regarding these  findings. Clinically, she is asymptomatic from this. She is no longer reporting  any hematuria, dysuria or abdominal pain. She does not report any flank pain. Her performance status is limited related to arthritis. She ambulates short distances inside her house. She relies on her daughter for most activities of daily living."      INTERVAL HISTORY:  [name] presents today for follow-up for Stage IV urothelial carcinoma.  She is accompanied by her daughter today.   Overall patient states she feels well. She is here for cycle 10 of tecentriq. She has been tolerating tecentriq well without any side effects. Overall she states she feels well. She denies any chest pain, shortness of breath, abdominal pain, focal weakness.  She has been eating well and has a good appetite. Her daughter noted a few days ago that she was having an odor to her urine and it was cloudy. She went to see her PCP and was diagnosed with a UTI and is currently on doxycycline. Her daughter is worried that the patient is not hydrating enough.  REVIEW OF SYSTEMS:  Review of Systems  Constitutional: Negative for appetite change, chills, fatigue, fever and unexpected weight change.  HENT:  Negative.  Negative for mouth sores.   Eyes: Negative.   Respiratory: Negative.  Negative for cough and shortness of breath.   Cardiovascular: Negative for leg swelling.  Gastrointestinal: Negative for abdominal pain, blood in stool, constipation, diarrhea, nausea and vomiting.  Endocrine: Negative.   Genitourinary: Negative.  Negative for dysuria, hematuria and vaginal bleeding.   Musculoskeletal: Negative for back pain. Arthralgias: chronic arthritis   Skin: Negative.  Negative for rash.  Neurological: Negative for dizziness and headaches.  Hematological: Does not bruise/bleed easily.  Psychiatric/Behavioral: Negative.  Negative for sleep disturbance.  All other systems reviewed and are negative.   PAST MEDICAL/SURGICAL HISTORY:  Past Medical History:  Diagnosis Date  . Arthritis   . Back pain, chronic   . CAD  (coronary artery disease)    NSTEMI 04/2011 with newly diagnosed 3V CAD s/p PCI/DES mLAD 04/11/11 with residual dz (per Dr. Burt Hunter, should have consideration of PCI of the occluded RCA based on symptoms and/or consideration of outpatient nuclear perfusion testing after the patient recovers from her infarct)  . Cancer ([place])    kidney ( Nov.2017)  . Chronic kidney disease (CKD), stage IV (severe) ([place]) 12/13/2013  . Hyperglycemia   . Hyperlipidemia   . Hypertension   . Hypothyroidism   . Ischemic cardiomyopathy    EF 45% by cath 04/20/11, 50-55% by echo 04/22/11. hypotension limiting medication titration   . Myocardial infarction ([place]) 2013  . Type 2 diabetes with nephropathy ([place]) 07/26/2014  . UTI (urinary tract infection) 12/13/2013   Past Surgical History:  Procedure Laterality Date  . BACK SURGERY    . BREAST LUMPECTOMY     right  . CORONARY STENT PLACEMENT    . HIP ARTHROPLASTY Right 04/29/2016   Procedure: ARTHROPLASTY BIPOLAR HIP (HEMIARTHROPLASTY);  Surgeon: Carole Civil, MD;  Location: [facility];  Service: Orthopedics;  Laterality: Right;  . LEFT HEART CATHETERIZATION WITH CORONARY ANGIOGRAM N/A 04/20/2011   Procedure: LEFT HEART CATHETERIZATION WITH CORONARY ANGIOGRAM;  Surgeon: Burnell Blanks, MD;  Location: [facility] CATH LAB;  Service: Cardiovascular;  Laterality: N/A;  . PERCUTANEOUS CORONARY STENT INTERVENTION (PCI-S) N/A 04/21/2011   Procedure: PERCUTANEOUS CORONARY STENT INTERVENTION (PCI-S);  Surgeon: Sherren Mocha, MD;  Location: [facility] CATH LAB;  Service: Cardiovascular;  Laterality: N/A;     SOCIAL  HISTORY:  Social History   Social History  . Marital status: Married    Spouse name: N/A  . Number of children: N/A  . Years of education: N/A   Occupational History  . Not on file.   Social History Main Topics  . Smoking status: Never Smoker  . Smokeless tobacco: Never Used  . Alcohol use No  . Drug use: No  . Sexual activity: No     Comment: married 61  years-husband at [employer]   Other Topics Concern  . Not on file   Social History Narrative   Lives in [place], [place] with her husband and daughter.    FAMILY HISTORY:  Family History  Problem Relation Age of Onset  . Other Mother   . Cancer Father   . Other Unknown        no known family cardiac disease  . Other Brother        Diptheria  . Depression Sister   . Pneumonia Sister   . Other Brother        murdered    CURRENT MEDICATIONS:  Outpatient Encounter Prescriptions as of 10/20/2016  Medication Sig Note  . amLODipine (NORVASC) 5 MG tablet Take 1 tablet (5 mg total) by mouth daily.   Marland Kitchen aspirin EC 81 MG tablet Take 81 mg by mouth daily.   Marland Kitchen docusate sodium (COLACE) 100 MG capsule Take 1 capsule (100 mg total) by mouth 2 (two) times daily. (Patient taking differently: Take 100 mg by mouth every other day. )   . doxycycline (VIBRA-TABS) 100 MG tablet Take 100 mg by mouth 2 (two) times daily.   . feeding supplement, ENSURE ENLIVE, (ENSURE ENLIVE) LIQD Take 237 mLs by mouth 2 (two) times daily between meals. 10/20/2016: Only taking one per day   . fish oil-omega-3 fatty acids 1000 MG capsule Take 1 g by mouth daily.   Marland Kitchen HYDROcodone-acetaminophen (NORCO/VICODIN) 5-325 MG tablet Take 1 tablet by mouth every 6 (six) hours as needed for moderate pain.   Marland Kitchen levothyroxine (SYNTHROID, LEVOTHROID) 112 MCG tablet Take 1 tablet (112 mcg total) by mouth daily before breakfast.   . meclizine (ANTIVERT) 25 MG tablet Take 25 mg by mouth 4 (four) times daily as needed. Dizziness   . metoprolol tartrate (LOPRESSOR) 25 MG tablet Take 1 tablet (25 mg total) by mouth 2 (two) times daily. (Patient taking differently: Take 12.5 mg by mouth 2 (two) times daily. ) 07/28/2016: 07/28/16-Takes 1/2 pill daily  . nitroGLYCERIN (NITROSTAT) 0.4 MG SL tablet Place 0.4 mg under the tongue every 5 (five) minutes x 3 doses as needed. For chest pain. 04/04/2014: Has not taken  . ondansetron (ZOFRAN) 8 MG tablet Take 1 tablet  (8 mg total) by mouth every 8 (eight) hours as needed for nausea or vomiting.   . pantoprazole (PROTONIX) 40 MG tablet Take 1 tablet (40 mg total) by mouth daily.   . prochlorperazine (COMPAZINE) 10 MG tablet Take 1 tablet (10 mg total) by mouth every 6 (six) hours as needed for nausea or vomiting.   . senna-docusate (SENOKOT-S) 8.6-50 MG tablet Take 1 tablet by mouth at bedtime as needed for mild constipation.   . Vitamin D, Ergocalciferol, (DRISDOL) 50000 UNITS CAPS Take 50,000 Units by mouth 2 (two) times a week. Tuesdays and Thurdays   . [DISCONTINUED] aspirin EC 325 MG EC tablet Take 1 tablet (325 mg total) by mouth daily with breakfast. Continue with 325 mg oral daily for total of 30 days for DVT  prophylaxis, then go back to aspirin 81 mg oral daily (Patient not taking: Reported on 10/20/2016)   . [DISCONTINUED] nitrofurantoin, macrocrystal-monohydrate, (MACROBID) 100 MG capsule Take 1 capsule (100 mg total) by mouth daily. (Patient not taking: Reported on 10/20/2016)    No facility-administered encounter medications on file as of 10/20/2016.      ALLERGIES:  Allergies  Allergen Reactions  . Ciprofloxacin Other (See Comments)    Hallucination  . Lipitor [Atorvastatin] Other (See Comments)    Muscle Weakness  . Penicillins Rash    Has patient had a PCN reaction causing immediate rash, facial/tongue/throat swelling, SOB or lightheadedness with hypotension: unknown Has patient had a PCN reaction causing severe rash involving mucus membranes or skin necrosis: unknown Has patient had a PCN reaction that required hospitalization: unknown Has patient had a PCN reaction occurring within the last 10 years: unknown If all of the above answers are "NO", then may proceed with Cephalosporin use. 2     PHYSICAL EXAM:  ECOG Performance status: 2 - Symptomatic; requires assistance   Vitals:   10/20/16 0935  BP: (!) 160/51  Pulse: (!) 57  Resp: 18  Temp: 97.6 F (36.4 C)  SpO2: 100%    Filed Weights   10/20/16 0935  Weight: 166 lb 12.8 oz (75.7 kg)    Physical Exam  Constitutional: She is oriented to person, place, and time and well-developed, well-nourished, and in no distress. No distress.  Exam done in wheelchair.   HENT:  Head: Normocephalic and atraumatic.  Mouth/Throat: Oropharynx is clear and moist. No oropharyngeal exudate.  She is mildly hard of hearing   Eyes: Pupils are equal, round, and reactive to light. Conjunctivae are normal. No scleral icterus.  Neck: Normal range of motion. Neck supple. No JVD present.  Cardiovascular: Normal rate, regular rhythm and normal heart sounds.  Exam reveals no gallop and no friction rub.   No murmur heard. Pulmonary/Chest: Effort normal and breath sounds normal. No respiratory distress. She has no wheezes. She has no rales.  Abdominal: Soft. Bowel sounds are normal. She exhibits no distension. There is no tenderness. There is no rebound and no guarding.  Musculoskeletal: Normal range of motion. She exhibits no edema or tenderness.  Lymphadenopathy:    She has no cervical adenopathy.  Neurological: She is alert and oriented to person, place, and time. No cranial nerve deficit.  Skin: Skin is warm and dry. No rash noted. No erythema. No pallor.  Psychiatric: Mood, memory, affect and judgment normal.  Nursing note and vitals reviewed.   LABORATORY DATA:  I have reviewed the labs as listed.  CBC    Component Value Date/Time   WBC 4.5 10/20/2016 0909   RBC 3.46 (L) 10/20/2016 0909   HGB 11.1 (L) 10/20/2016 0909   HCT 33.7 (L) 10/20/2016 0909   PLT 169 10/20/2016 0909   MCV 97.4 10/20/2016 0909   MCH 32.1 10/20/2016 0909   MCHC 32.9 10/20/2016 0909   RDW 13.4 10/20/2016 0909   LYMPHSABS 1.3 10/20/2016 0909   MONOABS 0.4 10/20/2016 0909   EOSABS 0.2 10/20/2016 0909   BASOSABS 0.0 10/20/2016 0909   CMP Latest Ref Rng & Units 09/29/2016 09/08/2016 09/06/2016  Glucose 65 - 99 mg/dL 260(H) 242(H) 174(H)  BUN 6 - 20  mg/dL 29(H) 19 23(H)  Creatinine 0.44 - 1.00 mg/dL 1.84(H) 1.57(H) 1.48(H)  Sodium 135 - 145 mmol/L 131(L) 132(L) 130(L)  Potassium 3.5 - 5.1 mmol/L 4.1 3.8 4.1  Chloride 101 - 111 mmol/L 96(L)  100(L) 99(L)  CO2 22 - 32 mmol/L 24 23 25   Calcium 8.9 - 10.3 mg/dL 8.9 8.5(L) 8.4(L)  Total Protein 6.5 - 8.1 g/dL 6.7 6.1(L) -  Total Bilirubin 0.3 - 1.2 mg/dL 0.7 0.5 -  Alkaline Phos 38 - 126 U/L 92 103 -  AST 15 - 41 U/L 17 32 -  ALT 14 - 54 U/L 14 29 -    PENDING LABS:    DIAGNOSTIC IMAGING:  MRI abdomen: 11/19/15     PATHOLOGY:  Liver biopsy: 12/07/15       ASSESSMENT & PLAN:   Stage IV Urothelial carcinoma, multifocal R ureteral masses with liver metastases: -Patient tolerating her treatments well and has improved quality of life with her treatments. Labs reviewed with the patient, proceed with cycle 10 of tecentriq. Will give her 500 cc of NS for hydration.  -Her restaging CT scan results 09/06/16 demonstrated overall improvement. The bulky tumor in the right collecting system and right proximal ureter is mildly reduced. Prior pulmonary nodules are no longer visible. Continued reduction in size of the segment 4b hepatic metastatic lesion which also demonstrates reduced conspicuity. -Plan to restage after cycle 12 of tecentriq.  Return to clinic in 3 weeks for follow-up and her next treatment.   Twana First, MD

## 2016-10-20 NOTE — Patient Instructions (Signed)
[facility] at [facility] Discharge Instructions  RECOMMENDATIONS MADE BY THE CONSULTANT AND ANY TEST RESULTS WILL BE SENT TO YOUR REFERRING PHYSICIAN.  You saw Dr. Zhou today.  Thank you for choosing [facility] at [facility] to provide your oncology and hematology care.  To afford each patient quality time with our provider, please arrive at least 15 minutes before your scheduled appointment time.    If you have a lab appointment with the Cancer Center please come in thru the  Main Entrance and check in at the main information desk  You need to re-schedule your appointment should you arrive 10 or more minutes late.  We strive to give you quality time with our providers, and arriving late affects you and other patients whose appointments are after yours.  Also, if you no show three or more times for appointments you may be dismissed from the clinic at the providers discretion.     Again, thank you for choosing [facility].  Our hope is that these requests will decrease the amount of time that you wait before being seen by our physicians.       _____________________________________________________________  Should you have questions after your visit to [facility], please contact our office at [phone] between the hours of 8:30 a.m. and 4:30 p.m.  Voicemails left after 4:30 p.m. will not be returned until the following business day.  For prescription refill requests, have your pharmacy contact our office.       Resources For Cancer Patients and their Caregivers ? American Cancer Society: Can assist with transportation, wigs, general needs, runs Look Good Feel Better.        1-888-227-6333 ? Cancer Care: Provides financial assistance, online support groups, medication/co-pay assistance.  1-800-813-HOPE (4673) ? [facility] Assists [place] cancer patients and their families through  emotional , educational and financial support.  336-427-4357 ? [place] DSS Where to apply for food stamps, Medicaid and utility assistance. 336-342-1394 ? [facility]: Transportation to medical appointments. 336-347-2287 ? Social Security Administration: May apply for disability if have a Stage IV cancer. 336-342-7796 1-800-772-1213 ? [place] Aging, Disability and Transit Services: Assists with nutrition, care and transit needs. 336-349-2343  Cancer Center Support Programs: @10RELATIVEDAYS@ > Cancer Support Group  2nd Tuesday of the month 1pm-2pm, Journey Room  > Creative Journey  3rd Tuesday of the month 1130am-1pm, Journey Room  > Look Good Feel Better  1st Wednesday of the month 10am-12 noon, Journey Room (Call American Cancer Society to register 1-800-395-5775)    

## 2016-10-20 NOTE — Progress Notes (Signed)
Joann Hunter tolerated Tecentriq infusion and hydration well without complaints or incident. Labs reviewed with Dr. Talbert Cage prior to administering this medication. VSS upon discharge. Pt discharged via wheelchair in satisfactory condition accompanied by her daughter

## 2016-11-10 ENCOUNTER — Encounter (HOSPITAL_COMMUNITY): Payer: Self-pay | Admitting: Oncology

## 2016-11-10 ENCOUNTER — Other Ambulatory Visit: Payer: Self-pay

## 2016-11-10 ENCOUNTER — Other Ambulatory Visit (HOSPITAL_COMMUNITY): Payer: Self-pay | Admitting: Oncology

## 2016-11-10 ENCOUNTER — Encounter (HOSPITAL_BASED_OUTPATIENT_CLINIC_OR_DEPARTMENT_OTHER): Payer: Medicare Other

## 2016-11-10 ENCOUNTER — Encounter (HOSPITAL_COMMUNITY): Payer: Medicare Other | Attending: Oncology

## 2016-11-10 ENCOUNTER — Encounter (HOSPITAL_BASED_OUTPATIENT_CLINIC_OR_DEPARTMENT_OTHER): Payer: Medicare Other | Admitting: Oncology

## 2016-11-10 VITALS — BP 186/54 | HR 58 | Temp 97.6°F | Resp 16 | Wt 156.8 lb

## 2016-11-10 VITALS — BP 146/45 | HR 65 | Temp 97.4°F | Resp 16

## 2016-11-10 DIAGNOSIS — Z5112 Encounter for antineoplastic immunotherapy: Secondary | ICD-10-CM | POA: Diagnosis not present

## 2016-11-10 DIAGNOSIS — C689 Malignant neoplasm of urinary organ, unspecified: Secondary | ICD-10-CM | POA: Diagnosis not present

## 2016-11-10 DIAGNOSIS — N184 Chronic kidney disease, stage 4 (severe): Secondary | ICD-10-CM

## 2016-11-10 DIAGNOSIS — E039 Hypothyroidism, unspecified: Secondary | ICD-10-CM

## 2016-11-10 DIAGNOSIS — C787 Secondary malignant neoplasm of liver and intrahepatic bile duct: Secondary | ICD-10-CM

## 2016-11-10 LAB — CBC WITH DIFFERENTIAL/PLATELET
BASOS ABS: 0 10*3/uL (ref 0.0–0.1)
BASOS PCT: 0 %
EOS PCT: 1 %
Eosinophils Absolute: 0 10*3/uL (ref 0.0–0.7)
HCT: 32.9 % — ABNORMAL LOW (ref 36.0–46.0)
Hemoglobin: 11 g/dL — ABNORMAL LOW (ref 12.0–15.0)
Lymphocytes Relative: 20 %
Lymphs Abs: 1.2 10*3/uL (ref 0.7–4.0)
MCH: 32.7 pg (ref 26.0–34.0)
MCHC: 33.4 g/dL (ref 30.0–36.0)
MCV: 97.9 fL (ref 78.0–100.0)
MONO ABS: 0.5 10*3/uL (ref 0.1–1.0)
Monocytes Relative: 8 %
Neutro Abs: 4.2 10*3/uL (ref 1.7–7.7)
Neutrophils Relative %: 71 %
PLATELETS: 182 10*3/uL (ref 150–400)
RBC: 3.36 MIL/uL — AB (ref 3.87–5.11)
RDW: 13.3 % (ref 11.5–15.5)
WBC: 5.9 10*3/uL (ref 4.0–10.5)

## 2016-11-10 LAB — COMPREHENSIVE METABOLIC PANEL
ALT: 16 U/L (ref 14–54)
AST: 20 U/L (ref 15–41)
Albumin: 3.1 g/dL — ABNORMAL LOW (ref 3.5–5.0)
Alkaline Phosphatase: 78 U/L (ref 38–126)
Anion gap: 9 (ref 5–15)
BUN: 28 mg/dL — AB (ref 6–20)
CHLORIDE: 98 mmol/L — AB (ref 101–111)
CO2: 26 mmol/L (ref 22–32)
Calcium: 8.8 mg/dL — ABNORMAL LOW (ref 8.9–10.3)
Creatinine, Ser: 1.71 mg/dL — ABNORMAL HIGH (ref 0.44–1.00)
GFR calc Af Amer: 31 mL/min — ABNORMAL LOW (ref >60)
GFR, EST NON AFRICAN AMERICAN: 27 mL/min — AB (ref >60)
GLUCOSE: 315 mg/dL — AB (ref 65–99)
POTASSIUM: 3.6 mmol/L (ref 3.5–5.1)
SODIUM: 133 mmol/L — AB (ref 135–145)
Total Bilirubin: 0.6 mg/dL (ref 0.3–1.2)
Total Protein: 6.4 g/dL — ABNORMAL LOW (ref 6.5–8.1)

## 2016-11-10 LAB — URINALYSIS, DIPSTICK ONLY
Bilirubin Urine: NEGATIVE
GLUCOSE, UA: 50 mg/dL — AB
HGB URINE DIPSTICK: NEGATIVE
Ketones, ur: NEGATIVE mg/dL
Nitrite: POSITIVE — AB
PH: 5 (ref 5.0–8.0)
Protein, ur: NEGATIVE mg/dL
SPECIFIC GRAVITY, URINE: 1.013 (ref 1.005–1.030)

## 2016-11-10 MED ORDER — SULFAMETHOXAZOLE-TRIMETHOPRIM 800-160 MG PO TABS: 1.0000 | ORAL_TABLET | Freq: Two times a day (BID) | ORAL | 0 refills | Status: DC

## 2016-11-10 MED ORDER — SODIUM CHLORIDE 0.9 % IV SOLN
Freq: Once | INTRAVENOUS | Status: AC
  Administered 2016-11-10: 11:00:00 via INTRAVENOUS

## 2016-11-10 MED ORDER — SODIUM CHLORIDE 0.9 % IV SOLN
1200.0000 mg | Freq: Once | INTRAVENOUS | Status: AC
  Administered 2016-11-10: 1200 mg via INTRAVENOUS
  Filled 2016-11-10: qty 20

## 2016-11-10 NOTE — Progress Notes (Signed)
[place], [place] [address]   CLINIC:  Medical Oncology/Hematology  PCP:  Monico Blitz, MD [address] [place] [address] [phone]   REASON FOR VISIT:  Follow-up for Stage IV Urothelial Carcinoma with liver mets  CURRENT THERAPY: Tecentriq IV every 21 days, beginning on 02/04/16   BRIEF ONCOLOGIC HISTORY:    Urothelial cancer ([place])   10/22/2015 Imaging    Presented with recurrent UTI and hematuria. Underwent cystoscopy and retrograde evaluation with Dr. Exie Parody revealed multiple filling defects in distal (R) ureter. Marked irregularly of proximal (R) ureter concerning for malignancy. (+) hydronephrosis noted and tumor noted out of ureteral orifice.        11/19/2015 Imaging    MRI abdomen: 3.2 cm mass in segment 4B of left hepatic lobe, suspicious for liver metastasis. Moderate right hydronephrosis and diffuse renal parenchymal atrophy. Abnormal signal intensity throughout right renal collecting system and proximal right ureter, consistent with known urothelial carcinoma. No other sites of abdominal metastatic disease identified.      12/07/2015 Procedure    Liver biopsy: Poorly differentiated carcinoma consistent with metastatic transitional cell histology.       01/01/2016 Initial Diagnosis    Urothelial carcinoma ([place])      02/04/2016 -  Chemotherapy    atezolizumab (TECENTRIQ) 1,200 mg IV every 21 days        06/09/2016 Imaging    CT C/A/P: Interval decrease in size of hepatic metastatic lesion.  Interval decrease in distention of the right renal collecting system and right ureter with central high attenuation material compatible with known primary urothelial malignancy.  Bilateral pulmonary nodules as above. Recommend attention on follow-up.  Subacute right-sided rib fractures.      09/06/2016 Imaging    CT C/A/P: 1. Overall improvement. The bulky tumor in the right collecting system and right proximal ureter is  mildly reduced. Prior pulmonary nodules are no longer visible. Continued reduction in size of the segment 4b hepatic metastatic lesion which also demonstrates reduced conspicuity. 2. Other imaging findings of potential clinical significance: Aortic Atherosclerosis (ICD10-I70.0). Healing response from prior right posterior rib fractures. Hepatic cirrhosis. Gastric varices and splenorenal shunting indicating portal venous hypertension. Severe degenerative left hip arthropathy. Chronic compression fracture at L2. Bony demineralization.       HISTORY OF PRESENT ILLNESS (From Dr. Donald Pore note on 01/27/2016):  [name] 83 y.o. femaleis here because of referral from Dr. Alen Blew for ureteral cancer. The patient lives closer to [facility] and felt it would be more convenient for her and her family to follow-up in [place].  Per Dr. Hazeline Junker note on 12/18/2015: "83 year old woman with history of hypertension, hyperlipidemia and coronary artery disease. She has been noted to have recurrent UTI and hematuria but subsequently underwent a cystoscopy and retrograde evaluation by Dr. Exie Parody in October 2017. Retrograde evaluation showed multiple filling defects in the distal right ureter. There is marked irregularity of the proximal right ureter specialist for malignant process. She also had an ultrasound which showed hydronephrosis and her cystoscopy on 10/22/2015 showed a tumor noted out of the ureteral orifice. MRI of the abdomen obtained on 11/19/2015 showed a 3.2 cm mass of the left hepatic lobe suspicious for liver metastasis. Moderate right hydronephrosis was also noted. No other sites of abdominal metastasis noted. She underwent a biopsy of her liver lesion on 12/07/2015 which showed metastatic carcinoma consistent with transitional cell histology. She was referred to me for evaluation regarding these  findings. Clinically, she is asymptomatic from this. She is no longer reporting  any hematuria, dysuria or abdominal pain. She does not report any flank pain. Her performance status is limited related to arthritis. She ambulates short distances inside her house. She relies on her daughter for most activities of daily living."      INTERVAL HISTORY:  Ms. Stice presents today for follow-up for Stage IV urothelial carcinoma.  She is accompanied by her daughter today.   Overall patient states she feels well. She is here for cycle 11 of tecentriq. She has been tolerating tecentriq well without any side effects. Overall she states she feels well. She denies any chest pain, shortness of breath, abdominal pain, focal weakness.  She continues to eat well and has a good appetite. Patient has completed the doxycycline for her UTI and asked if we can check her urine to see if the infection has cleared. She is not having any urinary symptoms at this time. She has chronic arthritic pains in her right leg, especially when the weather gets cold.  REVIEW OF SYSTEMS:  Review of Systems  Constitutional: Negative for appetite change, chills, fatigue, fever and unexpected weight change.  HENT:  Negative.  Negative for mouth sores.   Eyes: Negative.   Respiratory: Negative.  Negative for cough and shortness of breath.   Cardiovascular: Negative for leg swelling.  Gastrointestinal: Negative for abdominal pain, blood in stool, constipation, diarrhea, nausea and vomiting.  Endocrine: Negative.   Genitourinary: Negative.  Negative for dysuria, hematuria and vaginal bleeding.   Musculoskeletal: Negative for back pain. Arthralgias: chronic arthritis   Skin: Negative.  Negative for rash.  Neurological: Negative for dizziness and headaches.  Hematological: Does not bruise/bleed easily.  Psychiatric/Behavioral: Negative.  Negative for sleep disturbance.  All other systems reviewed and are negative.   PAST MEDICAL/SURGICAL HISTORY:  Past Medical History:  Diagnosis Date  . Arthritis   . Back  pain, chronic   . CAD (coronary artery disease)    NSTEMI 04/2011 with newly diagnosed 3V CAD s/p PCI/DES mLAD 04/11/11 with residual dz (per Dr. Burt Knack, should have consideration of PCI of the occluded RCA based on symptoms and/or consideration of outpatient nuclear perfusion testing after the patient recovers from her infarct)  . Cancer ([place])    kidney ( Nov.2017)  . Chronic kidney disease (CKD), stage IV (severe) ([place]) 12/13/2013  . Hyperglycemia   . Hyperlipidemia   . Hypertension   . Hypothyroidism   . Ischemic cardiomyopathy    EF 45% by cath 04/20/11, 50-55% by echo 04/22/11. hypotension limiting medication titration   . Myocardial infarction ([place]) 2013  . Type 2 diabetes with nephropathy ([place]) 07/26/2014  . UTI (urinary tract infection) 12/13/2013   Past Surgical History:  Procedure Laterality Date  . BACK SURGERY    . BREAST LUMPECTOMY     right  . CORONARY STENT PLACEMENT       SOCIAL HISTORY:  Social History   Socioeconomic History  . Marital status: Married    Spouse name: Not on file  . Number of children: Not on file  . Years of education: Not on file  . Highest education level: Not on file  Social Needs  . Financial resource strain: Not on file  . Food insecurity - worry: Not on file  . Food insecurity - inability: Not on file  . Transportation needs - medical: Not on file  . Transportation needs - non-medical: Not on file  Occupational History  . Not  on file  Tobacco Use  . Smoking status: Never Smoker  . Smokeless tobacco: Never Used  Substance and Sexual Activity  . Alcohol use: No  . Drug use: No  . Sexual activity: No    Birth control/protection: Post-menopausal    Comment: married 4 years-husband at [facility] [employer]  Other Topics Concern  . Not on file  Social History Narrative   Lives in [place], [place] with her husband and daughter.    FAMILY HISTORY:  Family History  Problem Relation Age of Onset  . Other Mother   . Cancer Father   . Other Unknown          no known family cardiac disease  . Other Brother        Diptheria  . Depression Sister   . Pneumonia Sister   . Other Brother        murdered    CURRENT MEDICATIONS:  Outpatient Encounter Medications as of 11/10/2016  Medication Sig Note  . amLODipine (NORVASC) 5 MG tablet Take 1 tablet (5 mg total) by mouth daily.   Marland Kitchen aspirin EC 81 MG tablet Take 81 mg by mouth daily.   Marland Kitchen docusate sodium (COLACE) 100 MG capsule Take 1 capsule (100 mg total) by mouth 2 (two) times daily. (Patient taking differently: Take 100 mg by mouth every other day. )   . doxycycline (VIBRA-TABS) 100 MG tablet Take 100 mg by mouth 2 (two) times daily.   . feeding supplement, ENSURE ENLIVE, (ENSURE ENLIVE) LIQD Take 237 mLs by mouth 2 (two) times daily between meals. 10/20/2016: Only taking one per day   . fish oil-omega-3 fatty acids 1000 MG capsule Take 1 g by mouth daily.   Marland Kitchen HYDROcodone-acetaminophen (NORCO/VICODIN) 5-325 MG tablet Take 1 tablet by mouth every 6 (six) hours as needed for moderate pain.   Marland Kitchen levothyroxine (SYNTHROID, LEVOTHROID) 112 MCG tablet Take 1 tablet (112 mcg total) by mouth daily before breakfast.   . meclizine (ANTIVERT) 25 MG tablet Take 25 mg by mouth 4 (four) times daily as needed. Dizziness   . metoprolol tartrate (LOPRESSOR) 25 MG tablet Take 1 tablet (25 mg total) by mouth 2 (two) times daily. (Patient taking differently: Take 12.5 mg by mouth 2 (two) times daily. ) 07/28/2016: 07/28/16-Takes 1/2 pill daily  . nitroGLYCERIN (NITROSTAT) 0.4 MG SL tablet Place 0.4 mg under the tongue every 5 (five) minutes x 3 doses as needed. For chest pain. 04/04/2014: Has not taken  . ondansetron (ZOFRAN) 8 MG tablet Take 1 tablet (8 mg total) by mouth every 8 (eight) hours as needed for nausea or vomiting.   . pantoprazole (PROTONIX) 40 MG tablet Take 1 tablet (40 mg total) by mouth daily.   . prochlorperazine (COMPAZINE) 10 MG tablet Take 1 tablet (10 mg total) by mouth every 6 (six) hours as needed  for nausea or vomiting.   . senna-docusate (SENOKOT-S) 8.6-50 MG tablet Take 1 tablet by mouth at bedtime as needed for mild constipation.   . Vitamin D, Ergocalciferol, (DRISDOL) 50000 UNITS CAPS Take 50,000 Units by mouth 2 (two) times a week. Tuesdays and Thurdays    No facility-administered encounter medications on file as of 11/10/2016.      ALLERGIES:  Allergies  Allergen Reactions  . Ciprofloxacin Other (See Comments)    Hallucination  . Lipitor [Atorvastatin] Other (See Comments)    Muscle Weakness  . Penicillins Rash    Has patient had a PCN reaction causing immediate rash, facial/tongue/throat swelling, SOB or lightheadedness  with hypotension: unknown Has patient had a PCN reaction causing severe rash involving mucus membranes or skin necrosis: unknown Has patient had a PCN reaction that required hospitalization: unknown Has patient had a PCN reaction occurring within the last 10 years: unknown If all of the above answers are "NO", then may proceed with Cephalosporin use. 2     PHYSICAL EXAM:  ECOG Performance status: 2 - Symptomatic; requires assistance   Vitals:   11/10/16 0940  BP: (!) 186/54  Pulse: (!) 58  Resp: 16  Temp: 97.6 F (36.4 C)  SpO2: 98%   Filed Weights   11/10/16 0940  Weight: 156 lb 12.8 oz (71.1 kg)    Physical Exam  Constitutional: She is oriented to person, place, and time and well-developed, well-nourished, and in no distress. No distress.  Exam done in wheelchair.   HENT:  Head: Normocephalic and atraumatic.  Mouth/Throat: Oropharynx is clear and moist. No oropharyngeal exudate.  She is mildly hard of hearing   Eyes: Conjunctivae are normal. Pupils are equal, round, and reactive to light. No scleral icterus.  Neck: Normal range of motion. Neck supple. No JVD present.  Cardiovascular: Normal rate, regular rhythm and normal heart sounds. Exam reveals no gallop and no friction rub.  No murmur heard. Pulmonary/Chest: Effort normal and  breath sounds normal. No respiratory distress. She has no wheezes. She has no rales.  Abdominal: Soft. Bowel sounds are normal. She exhibits no distension. There is no tenderness. There is no rebound and no guarding.  Musculoskeletal: Normal range of motion. She exhibits no edema or tenderness.  Lymphadenopathy:    She has no cervical adenopathy.  Neurological: She is alert and oriented to person, place, and time. No cranial nerve deficit.  Skin: Skin is warm and dry. No rash noted. No erythema. No pallor.  Psychiatric: Mood, memory, affect and judgment normal.  Nursing note and vitals reviewed.   LABORATORY DATA:  I have reviewed the labs as listed.  CBC    Component Value Date/Time   WBC 4.5 10/20/2016 0909   RBC 3.46 (L) 10/20/2016 0909   HGB 11.1 (L) 10/20/2016 0909   HCT 33.7 (L) 10/20/2016 0909   PLT 169 10/20/2016 0909   MCV 97.4 10/20/2016 0909   MCH 32.1 10/20/2016 0909   MCHC 32.9 10/20/2016 0909   RDW 13.4 10/20/2016 0909   LYMPHSABS 1.3 10/20/2016 0909   MONOABS 0.4 10/20/2016 0909   EOSABS 0.2 10/20/2016 0909   BASOSABS 0.0 10/20/2016 0909   CMP Latest Ref Rng & Units 10/20/2016 09/29/2016 09/08/2016  Glucose 65 - 99 mg/dL 242(H) 260(H) 242(H)  BUN 6 - 20 mg/dL 30(H) 29(H) 19  Creatinine 0.44 - 1.00 mg/dL 1.82(H) 1.84(H) 1.57(H)  Sodium 135 - 145 mmol/L 129(L) 131(L) 132(L)  Potassium 3.5 - 5.1 mmol/L 4.2 4.1 3.8  Chloride 101 - 111 mmol/L 96(L) 96(L) 100(L)  CO2 22 - 32 mmol/L 25 24 23   Calcium 8.9 - 10.3 mg/dL 9.1 8.9 8.5(L)  Total Protein 6.5 - 8.1 g/dL 6.4(L) 6.7 6.1(L)  Total Bilirubin 0.3 - 1.2 mg/dL 0.7 0.7 0.5  Alkaline Phos 38 - 126 U/L 80 92 103  AST 15 - 41 U/L 22 17 32  ALT 14 - 54 U/L 13(L) 14 29    PENDING LABS:    DIAGNOSTIC IMAGING:  MRI abdomen: 11/19/15     PATHOLOGY:  Liver biopsy: 12/07/15       ASSESSMENT & PLAN:   Stage IV Urothelial carcinoma, multifocal R ureteral masses  with liver metastases: -Patient tolerating her  treatments well and has improved quality of life with her treatments. Labs reviewed with the patient, proceed with cycle 11 of tecentriq.  -Will check her UA today to make sure her UTI has resolved. -Her restaging CT scan results 09/06/16 demonstrated overall improvement. The bulky tumor in the right collecting system and right proximal ureter is mildly reduced. Prior pulmonary nodules are no longer visible. Continued reduction in size of the segment 4b hepatic metastatic lesion which also demonstrates reduced conspicuity. -Plan to restage after cycle 12 of tecentriq.  Return to clinic in 3 weeks for follow-up and her next treatment.   Twana First, MD

## 2016-11-10 NOTE — Patient Instructions (Signed)
[facility] at [facility] Discharge Instructions  RECOMMENDATIONS MADE BY THE CONSULTANT AND ANY TEST RESULTS WILL BE SENT TO YOUR REFERRING PHYSICIAN.  You were seen today by Dr. Louise Zhou    Thank you for choosing [facility] at [facility] to provide your oncology and hematology care.  To afford each patient quality time with our provider, please arrive at least 15 minutes before your scheduled appointment time.    If you have a lab appointment with the Cancer Center please come in thru the  Main Entrance and check in at the main information desk  You need to re-schedule your appointment should you arrive 10 or more minutes late.  We strive to give you quality time with our providers, and arriving late affects you and other patients whose appointments are after yours.  Also, if you no show three or more times for appointments you may be dismissed from the clinic at the providers discretion.     Again, thank you for choosing [facility].  Our hope is that these requests will decrease the amount of time that you wait before being seen by our physicians.       _____________________________________________________________  Should you have questions after your visit to  Cancer Center, please contact our office at [phone] between the hours of 8:30 a.m. and 4:30 p.m.  Voicemails left after 4:30 p.m. will not be returned until the following business day.  For prescription refill requests, have your pharmacy contact our office.       Resources For Cancer Patients and their Caregivers ? American Cancer Society: Can assist with transportation, wigs, general needs, runs Look Good Feel Better.        1-888-227-6333 ? Cancer Care: Provides financial assistance, online support groups, medication/co-pay assistance.  1-800-813-HOPE (4673) ? [facility] Assists [place] cancer patients and their  families through emotional , educational and financial support.  336-427-4357 ? [place] DSS Where to apply for food stamps, Medicaid and utility assistance. 336-342-1394 ? RCATS: Transportation to medical appointments. 336-347-2287 ? Social Security Administration: May apply for disability if have a Stage IV cancer. 336-342-7796 1-800-772-1213 ? [place] Aging, Disability and Transit Services: Assists with nutrition, care and transit needs. 336-349-2343  Cancer Center Support Programs: @10RELATIVEDAYS@ > Cancer Support Group  2nd Tuesday of the month 1pm-2pm, Journey Room  > Creative Journey  3rd Tuesday of the month 1130am-1pm, Journey Room  > Look Good Feel Better  1st Wednesday of the month 10am-12 noon, Journey Room (Call American Cancer Society to register 1-800-395-5775)    

## 2016-11-10 NOTE — Progress Notes (Signed)
Treatment given per orders. Patient tolerated it well without problems. Vitals stable and discharged home from clinic ambulatory. Follow up as scheduled.  

## 2016-11-10 NOTE — Patient Instructions (Signed)
[facility] Discharge Instructions for Patients Receiving Chemotherapy   Beginning January 23rd 2017 lab work for the Cancer Center will be done in the  Main lab at [facility] on 1st floor. If you have a lab appointment with the Cancer Center please come in thru the  Main Entrance and check in at the main information desk   Today you received the following chemotherapy agents   To help prevent nausea and vomiting after your treatment, we encourage you to take your nausea medication     If you develop nausea and vomiting, or diarrhea that is not controlled by your medication, call the clinic.  The clinic phone number is [phone]. Office hours are Monday-Friday 8:30am-5:00pm.  BELOW ARE SYMPTOMS THAT SHOULD BE REPORTED IMMEDIATELY:  *FEVER GREATER THAN 101.0 F  *CHILLS WITH OR WITHOUT FEVER  NAUSEA AND VOMITING THAT IS NOT CONTROLLED WITH YOUR NAUSEA MEDICATION  *UNUSUAL SHORTNESS OF BREATH  *UNUSUAL BRUISING OR BLEEDING  TENDERNESS IN MOUTH AND THROAT WITH OR WITHOUT PRESENCE OF ULCERS  *URINARY PROBLEMS  *BOWEL PROBLEMS  UNUSUAL RASH Items with * indicate a potential emergency and should be followed up as soon as possible. If you have an emergency after office hours please contact your primary care physician or go to the nearest emergency department.  Please call the clinic during office hours if you have any questions or concerns.   You may also contact the Patient Navigator at [phone] should you have any questions or need assistance in obtaining follow up care.      Resources For Cancer Patients and their Caregivers ? American Cancer Society: Can assist with transportation, wigs, general needs, runs Look Good Feel Better.        1-888-227-6333 ? Cancer Care: Provides financial assistance, online support groups, medication/co-pay assistance.  1-800-813-HOPE (4673) ? [facility] Assists [place] cancer  patients and their families through emotional , educational and financial support.  336-427-4357 ? [place] DSS Where to apply for food stamps, Medicaid and utility assistance. 336-342-1394 ? RCATS: Transportation to medical appointments. 336-347-2287 ? Social Security Administration: May apply for disability if have a Stage IV cancer. 336-342-7796 1-800-772-1213 ? [place] Aging, Disability and Transit Services: Assists with nutrition, care and transit needs. 336-349-2343         

## 2016-11-11 ENCOUNTER — Other Ambulatory Visit (HOSPITAL_COMMUNITY): Payer: Self-pay

## 2016-11-11 DIAGNOSIS — C689 Malignant neoplasm of urinary organ, unspecified: Secondary | ICD-10-CM

## 2016-11-11 DIAGNOSIS — N390 Urinary tract infection, site not specified: Secondary | ICD-10-CM

## 2016-11-13 LAB — URINE CULTURE: Culture: >=100000 — AB

## 2016-12-01 ENCOUNTER — Ambulatory Visit (HOSPITAL_COMMUNITY): Payer: Medicare Other

## 2016-12-01 ENCOUNTER — Other Ambulatory Visit (HOSPITAL_COMMUNITY): Payer: Medicare Other

## 2016-12-08 ENCOUNTER — Encounter (HOSPITAL_BASED_OUTPATIENT_CLINIC_OR_DEPARTMENT_OTHER): Payer: Medicare Other

## 2016-12-08 ENCOUNTER — Encounter (HOSPITAL_COMMUNITY): Payer: Self-pay

## 2016-12-08 ENCOUNTER — Encounter (HOSPITAL_COMMUNITY): Payer: Self-pay | Admitting: Oncology

## 2016-12-08 ENCOUNTER — Encounter (HOSPITAL_COMMUNITY): Payer: Medicare Other | Attending: Oncology

## 2016-12-08 ENCOUNTER — Other Ambulatory Visit: Payer: Self-pay

## 2016-12-08 ENCOUNTER — Encounter (HOSPITAL_BASED_OUTPATIENT_CLINIC_OR_DEPARTMENT_OTHER): Payer: Medicare Other | Admitting: Oncology

## 2016-12-08 VITALS — BP 131/43 | HR 67 | Temp 98.1°F | Resp 18 | Wt 165.4 lb

## 2016-12-08 DIAGNOSIS — Z809 Family history of malignant neoplasm, unspecified: Secondary | ICD-10-CM | POA: Diagnosis not present

## 2016-12-08 DIAGNOSIS — E1122 Type 2 diabetes mellitus with diabetic chronic kidney disease: Secondary | ICD-10-CM | POA: Diagnosis not present

## 2016-12-08 DIAGNOSIS — C689 Malignant neoplasm of urinary organ, unspecified: Secondary | ICD-10-CM

## 2016-12-08 DIAGNOSIS — M1990 Unspecified osteoarthritis, unspecified site: Secondary | ICD-10-CM | POA: Insufficient documentation

## 2016-12-08 DIAGNOSIS — Z7982 Long term (current) use of aspirin: Secondary | ICD-10-CM | POA: Diagnosis not present

## 2016-12-08 DIAGNOSIS — I2510 Atherosclerotic heart disease of native coronary artery without angina pectoris: Secondary | ICD-10-CM | POA: Insufficient documentation

## 2016-12-08 DIAGNOSIS — Z955 Presence of coronary angioplasty implant and graft: Secondary | ICD-10-CM | POA: Diagnosis not present

## 2016-12-08 DIAGNOSIS — C787 Secondary malignant neoplasm of liver and intrahepatic bile duct: Secondary | ICD-10-CM

## 2016-12-08 DIAGNOSIS — Z818 Family history of other mental and behavioral disorders: Secondary | ICD-10-CM | POA: Diagnosis not present

## 2016-12-08 DIAGNOSIS — Z79891 Long term (current) use of opiate analgesic: Secondary | ICD-10-CM | POA: Insufficient documentation

## 2016-12-08 DIAGNOSIS — R918 Other nonspecific abnormal finding of lung field: Secondary | ICD-10-CM | POA: Diagnosis not present

## 2016-12-08 DIAGNOSIS — G8929 Other chronic pain: Secondary | ICD-10-CM | POA: Diagnosis not present

## 2016-12-08 DIAGNOSIS — Z5112 Encounter for antineoplastic immunotherapy: Secondary | ICD-10-CM

## 2016-12-08 DIAGNOSIS — I129 Hypertensive chronic kidney disease with stage 1 through stage 4 chronic kidney disease, or unspecified chronic kidney disease: Secondary | ICD-10-CM | POA: Diagnosis not present

## 2016-12-08 DIAGNOSIS — Z79899 Other long term (current) drug therapy: Secondary | ICD-10-CM | POA: Diagnosis not present

## 2016-12-08 DIAGNOSIS — I10 Essential (primary) hypertension: Secondary | ICD-10-CM | POA: Diagnosis not present

## 2016-12-08 DIAGNOSIS — Z87440 Personal history of urinary (tract) infections: Secondary | ICD-10-CM | POA: Diagnosis not present

## 2016-12-08 DIAGNOSIS — I252 Old myocardial infarction: Secondary | ICD-10-CM | POA: Insufficient documentation

## 2016-12-08 DIAGNOSIS — E785 Hyperlipidemia, unspecified: Secondary | ICD-10-CM | POA: Diagnosis not present

## 2016-12-08 DIAGNOSIS — E039 Hypothyroidism, unspecified: Secondary | ICD-10-CM | POA: Insufficient documentation

## 2016-12-08 DIAGNOSIS — I255 Ischemic cardiomyopathy: Secondary | ICD-10-CM | POA: Diagnosis not present

## 2016-12-08 DIAGNOSIS — Z888 Allergy status to other drugs, medicaments and biological substances status: Secondary | ICD-10-CM | POA: Diagnosis not present

## 2016-12-08 DIAGNOSIS — Z880 Allergy status to penicillin: Secondary | ICD-10-CM | POA: Diagnosis not present

## 2016-12-08 DIAGNOSIS — N184 Chronic kidney disease, stage 4 (severe): Secondary | ICD-10-CM | POA: Insufficient documentation

## 2016-12-08 LAB — COMPREHENSIVE METABOLIC PANEL
ALT: 33 U/L (ref 14–54)
AST: 30 U/L (ref 15–41)
Albumin: 3 g/dL — ABNORMAL LOW (ref 3.5–5.0)
Alkaline Phosphatase: 92 U/L (ref 38–126)
Anion gap: 6 (ref 5–15)
BILIRUBIN TOTAL: 0.7 mg/dL (ref 0.3–1.2)
BUN: 28 mg/dL — AB (ref 6–20)
CALCIUM: 8.9 mg/dL (ref 8.9–10.3)
CHLORIDE: 102 mmol/L (ref 101–111)
CO2: 26 mmol/L (ref 22–32)
CREATININE: 1.98 mg/dL — AB (ref 0.44–1.00)
GFR calc Af Amer: 26 mL/min — ABNORMAL LOW (ref >60)
GFR, EST NON AFRICAN AMERICAN: 22 mL/min — AB (ref >60)
GLUCOSE: 276 mg/dL — AB (ref 65–99)
Potassium: 3.8 mmol/L (ref 3.5–5.1)
Sodium: 134 mmol/L — ABNORMAL LOW (ref 135–145)
Total Protein: 6.1 g/dL — ABNORMAL LOW (ref 6.5–8.1)

## 2016-12-08 LAB — CBC WITH DIFFERENTIAL/PLATELET
BASOS PCT: 0 %
Basophils Absolute: 0 10*3/uL (ref 0.0–0.1)
EOS PCT: 2 %
Eosinophils Absolute: 0.1 10*3/uL (ref 0.0–0.7)
HEMATOCRIT: 34.7 % — AB (ref 36.0–46.0)
Hemoglobin: 11.2 g/dL — ABNORMAL LOW (ref 12.0–15.0)
LYMPHS PCT: 21 %
Lymphs Abs: 1.2 10*3/uL (ref 0.7–4.0)
MCH: 32.1 pg (ref 26.0–34.0)
MCHC: 32.3 g/dL (ref 30.0–36.0)
MCV: 99.4 fL (ref 78.0–100.0)
MONO ABS: 0.6 10*3/uL (ref 0.1–1.0)
MONOS PCT: 10 %
NEUTROS ABS: 3.8 10*3/uL (ref 1.7–7.7)
Neutrophils Relative %: 67 %
PLATELETS: 187 10*3/uL (ref 150–400)
RBC: 3.49 MIL/uL — ABNORMAL LOW (ref 3.87–5.11)
RDW: 14.4 % (ref 11.5–15.5)
WBC: 5.8 10*3/uL (ref 4.0–10.5)

## 2016-12-08 MED ORDER — ATEZOLIZUMAB CHEMO INJECTION 1200 MG/20ML
1200.0000 mg | Freq: Once | INTRAVENOUS | Status: AC
  Administered 2016-12-08: 1200 mg via INTRAVENOUS
  Filled 2016-12-08: qty 20

## 2016-12-08 MED ORDER — SODIUM CHLORIDE 0.9 % IV SOLN
Freq: Once | INTRAVENOUS | Status: AC
  Administered 2016-12-08: 13:00:00 via INTRAVENOUS

## 2016-12-08 NOTE — Patient Instructions (Signed)
[facility] at [facility] Discharge Instructions  RECOMMENDATIONS MADE BY THE CONSULTANT AND ANY TEST RESULTS WILL BE SENT TO YOUR REFERRING PHYSICIAN.  You were seen today by Dr. Louise Zhou    Thank you for choosing [facility] at [facility] to provide your oncology and hematology care.  To afford each patient quality time with our provider, please arrive at least 15 minutes before your scheduled appointment time.    If you have a lab appointment with the Cancer Center please come in thru the  Main Entrance and check in at the main information desk  You need to re-schedule your appointment should you arrive 10 or more minutes late.  We strive to give you quality time with our providers, and arriving late affects you and other patients whose appointments are after yours.  Also, if you no show three or more times for appointments you may be dismissed from the clinic at the providers discretion.     Again, thank you for choosing [facility].  Our hope is that these requests will decrease the amount of time that you wait before being seen by our physicians.       _____________________________________________________________  Should you have questions after your visit to [facility], please contact our office at [phone] between the hours of 8:30 a.m. and 4:30 p.m.  Voicemails left after 4:30 p.m. will not be returned until the following business day.  For prescription refill requests, have your pharmacy contact our office.       Resources For Cancer Patients and their Caregivers ? American Cancer Society: Can assist with transportation, wigs, general needs, runs Look Good Feel Better.        1-888-227-6333 ? Cancer Care: Provides financial assistance, online support groups, medication/co-pay assistance.  1-800-813-HOPE (4673) ? [facility] Assists [place] cancer patients and their  families through emotional , educational and financial support.  336-427-4357 ? [place] DSS Where to apply for food stamps, Medicaid and utility assistance. 336-342-1394 ? RCATS: Transportation to medical appointments. 336-347-2287 ? Social Security Administration: May apply for disability if have a Stage IV cancer. 336-342-7796 1-800-772-1213 ? [place] Aging, Disability and Transit Services: Assists with nutrition, care and transit needs. 336-349-2343  Cancer Center Support Programs: @10RELATIVEDAYS@ > Cancer Support Group  2nd Tuesday of the month 1pm-2pm, Journey Room  > Creative Journey  3rd Tuesday of the month 1130am-1pm, Journey Room  > Look Good Feel Better  1st Wednesday of the month 10am-12 noon, Journey Room (Call American Cancer Society to register 1-800-395-5775)    

## 2016-12-08 NOTE — Progress Notes (Signed)
Joann Hunter tolerated Tecentriq infusion well without complaints or incident. Labs reviewed with Dr. Talbert Cage prior to administering this medication. Pt to have a repeat TSH with next infusion per MD order. VSS upon discharge. Pt discharged via wheelchair in satisfactory condition accompanied by her daughter

## 2016-12-08 NOTE — Progress Notes (Signed)
[place], [place] [address]   CLINIC:  Medical Oncology/Hematology  PCP:  Monico Blitz, MD [address] [place] [address] [phone]   REASON FOR VISIT:  Follow-up for Stage IV Urothelial Carcinoma with liver mets  CURRENT THERAPY: Tecentriq IV every 21 days, beginning on 02/04/16   BRIEF ONCOLOGIC HISTORY:    Urothelial cancer ([place])   10/22/2015 Imaging    Presented with recurrent UTI and hematuria. Underwent cystoscopy and retrograde evaluation with Dr. Exie Parody revealed multiple filling defects in distal (R) ureter. Marked irregularly of proximal (R) ureter concerning for malignancy. (+) hydronephrosis noted and tumor noted out of ureteral orifice.        11/19/2015 Imaging    MRI abdomen: 3.2 cm mass in segment 4B of left hepatic lobe, suspicious for liver metastasis. Moderate right hydronephrosis and diffuse renal parenchymal atrophy. Abnormal signal intensity throughout right renal collecting system and proximal right ureter, consistent with known urothelial carcinoma. No other sites of abdominal metastatic disease identified.      12/07/2015 Procedure    Liver biopsy: Poorly differentiated carcinoma consistent with metastatic transitional cell histology.       01/01/2016 Initial Diagnosis    Urothelial carcinoma ([place])      02/04/2016 -  Chemotherapy    atezolizumab (TECENTRIQ) 1,200 mg IV every 21 days        06/09/2016 Imaging    CT C/A/P: Interval decrease in size of hepatic metastatic lesion.  Interval decrease in distention of the right renal collecting system and right ureter with central high attenuation material compatible with known primary urothelial malignancy.  Bilateral pulmonary nodules as above. Recommend attention on follow-up.  Subacute right-sided rib fractures.      09/06/2016 Imaging    CT C/A/P: 1. Overall improvement. The bulky tumor in the right collecting system and right proximal ureter is  mildly reduced. Prior pulmonary nodules are no longer visible. Continued reduction in size of the segment 4b hepatic metastatic lesion which also demonstrates reduced conspicuity. 2. Other imaging findings of potential clinical significance: Aortic Atherosclerosis (ICD10-I70.0). Healing response from prior right posterior rib fractures. Hepatic cirrhosis. Gastric varices and splenorenal shunting indicating portal venous hypertension. Severe degenerative left hip arthropathy. Chronic compression fracture at L2. Bony demineralization.       HISTORY OF PRESENT ILLNESS (From Dr. Donald Pore note on 01/27/2016):  Joann Hunter 83 y.o. femaleis here because of referral from Dr. Alen Blew for ureteral cancer. The patient lives closer to [facility] and felt it would be more convenient for her and her family to follow-up in [place].  Per Dr. Hazeline Junker note on 12/18/2015: "83 year old woman with history of hypertension, hyperlipidemia and coronary artery disease. She has been noted to have recurrent UTI and hematuria but subsequently underwent a cystoscopy and retrograde evaluation by Dr. Exie Parody in October 2017. Retrograde evaluation showed multiple filling defects in the distal right ureter. There is marked irregularity of the proximal right ureter specialist for malignant process. She also had an ultrasound which showed hydronephrosis and her cystoscopy on 10/22/2015 showed a tumor noted out of the ureteral orifice. MRI of the abdomen obtained on 11/19/2015 showed a 3.2 cm mass of the left hepatic lobe suspicious for liver metastasis. Moderate right hydronephrosis was also noted. No other sites of abdominal metastasis noted. She underwent a biopsy of her liver lesion on 12/07/2015 which showed metastatic carcinoma consistent with transitional cell histology. She was referred to me for evaluation regarding these  findings. Clinically, she is asymptomatic from this. She is no longer reporting  any hematuria, dysuria or abdominal pain. She does not report any flank pain. Her performance status is limited related to arthritis. She ambulates short distances inside her house. She relies on her daughter for most activities of daily living."      INTERVAL HISTORY:  Ms. Williams presents today for follow-up for Stage IV urothelial carcinoma.  She is accompanied by her daughter today.   Overall patient states she feels well. She is here for cycle 12 of tecentriq. She has been tolerating tecentriq well without any side effects. Overall she states she feels well. She denies any chest pain, shortness of breath, abdominal pain, focal weakness.  She continues to eat well and has a good appetite.She states her urinary symptoms have resolved with the 2nd round of antibiotics.   REVIEW OF SYSTEMS:  Review of Systems  Constitutional: Negative for appetite change, chills, fatigue, fever and unexpected weight change.  HENT:  Negative.  Negative for mouth sores.   Eyes: Negative.   Respiratory: Negative.  Negative for cough and shortness of breath.   Cardiovascular: Negative for leg swelling.  Gastrointestinal: Negative for abdominal pain, blood in stool, constipation, diarrhea, nausea and vomiting.  Endocrine: Negative.   Genitourinary: Negative.  Negative for dysuria, hematuria and vaginal bleeding.   Musculoskeletal: Negative for back pain. Arthralgias: chronic arthritis   Skin: Negative.  Negative for rash.  Neurological: Negative for dizziness and headaches.  Hematological: Does not bruise/bleed easily.  Psychiatric/Behavioral: Negative.  Negative for sleep disturbance.  All other systems reviewed and are negative.   PAST MEDICAL/SURGICAL HISTORY:  Past Medical History:  Diagnosis Date  . Arthritis   . Back pain, chronic   . CAD (coronary artery disease)    NSTEMI 04/2011 with newly diagnosed 3V CAD s/p PCI/DES mLAD 04/11/11 with residual dz (per Dr. Burt Knack, should have consideration of PCI  of the occluded RCA based on symptoms and/or consideration of outpatient nuclear perfusion testing after the patient recovers from her infarct)  . Cancer ([place])    kidney ( Nov.2017)  . Chronic kidney disease (CKD), stage IV (severe) ([place]) 12/13/2013  . Hyperglycemia   . Hyperlipidemia   . Hypertension   . Hypothyroidism   . Ischemic cardiomyopathy    EF 45% by cath 04/20/11, 50-55% by echo 04/22/11. hypotension limiting medication titration   . Myocardial infarction ([place]) 2013  . Type 2 diabetes with nephropathy ([place]) 07/26/2014  . UTI (urinary tract infection) 12/13/2013   Past Surgical History:  Procedure Laterality Date  . BACK SURGERY    . BREAST LUMPECTOMY     right  . CORONARY STENT PLACEMENT    . HIP ARTHROPLASTY Right 04/29/2016   Procedure: ARTHROPLASTY BIPOLAR HIP (HEMIARTHROPLASTY);  Surgeon: Carole Civil, MD;  Location: [facility];  Service: Orthopedics;  Laterality: Right;  . LEFT HEART CATHETERIZATION WITH CORONARY ANGIOGRAM N/A 04/20/2011   Procedure: LEFT HEART CATHETERIZATION WITH CORONARY ANGIOGRAM;  Surgeon: Burnell Blanks, MD;  Location: [facility] CATH LAB;  Service: Cardiovascular;  Laterality: N/A;  . PERCUTANEOUS CORONARY STENT INTERVENTION (PCI-S) N/A 04/21/2011   Procedure: PERCUTANEOUS CORONARY STENT INTERVENTION (PCI-S);  Surgeon: Sherren Mocha, MD;  Location: [facility] CATH LAB;  Service: Cardiovascular;  Laterality: N/A;     SOCIAL HISTORY:  Social History   Socioeconomic History  . Marital status: Married    Spouse name: Not on file  . Number of children: Not on file  . Years of education: Not  on file  . Highest education level: Not on file  Social Needs  . Financial resource strain: Not on file  . Food insecurity - worry: Not on file  . Food insecurity - inability: Not on file  . Transportation needs - medical: Not on file  . Transportation needs - non-medical: Not on file  Occupational History  . Not on file  Tobacco Use  . Smoking status: Never  Smoker  . Smokeless tobacco: Never Used  Substance and Sexual Activity  . Alcohol use: No  . Drug use: No  . Sexual activity: No    Birth control/protection: Post-menopausal    Comment: married 100 years-husband at [employer]  Other Topics Concern  . Not on file  Social History Narrative   Lives in [place], [place] with her husband and daughter.    FAMILY HISTORY:  Family History  Problem Relation Age of Onset  . Other Mother   . Cancer Father   . Other Unknown        no known family cardiac disease  . Other Brother        Diptheria  . Depression Sister   . Pneumonia Sister   . Other Brother        murdered    CURRENT MEDICATIONS:  Outpatient Encounter Medications as of 12/08/2016  Medication Sig Note  . amLODipine (NORVASC) 5 MG tablet Take 1 tablet (5 mg total) by mouth daily.   Marland Kitchen aspirin EC 81 MG tablet Take 81 mg by mouth daily.   Marland Kitchen docusate sodium (COLACE) 100 MG capsule Take 1 capsule (100 mg total) by mouth 2 (two) times daily. (Patient taking differently: Take 100 mg by mouth every other day. )   . doxycycline (VIBRA-TABS) 100 MG tablet Take 100 mg by mouth 2 (two) times daily.   . feeding supplement, ENSURE ENLIVE, (ENSURE ENLIVE) LIQD Take 237 mLs by mouth 2 (two) times daily between meals. 10/20/2016: Only taking one per day   . fish oil-omega-3 fatty acids 1000 MG capsule Take 1 g by mouth daily.   Marland Kitchen HYDROcodone-acetaminophen (NORCO/VICODIN) 5-325 MG tablet Take 1 tablet by mouth every 6 (six) hours as needed for moderate pain.   Marland Kitchen levothyroxine (SYNTHROID, LEVOTHROID) 112 MCG tablet Take 1 tablet (112 mcg total) by mouth daily before breakfast.   . meclizine (ANTIVERT) 25 MG tablet Take 25 mg by mouth 4 (four) times daily as needed. Dizziness   . metoprolol tartrate (LOPRESSOR) 25 MG tablet Take 1 tablet (25 mg total) by mouth 2 (two) times daily. (Patient taking differently: Take 12.5 mg by mouth 2 (two) times daily. ) 07/28/2016: 07/28/16-Takes 1/2 pill daily  .  nitroGLYCERIN (NITROSTAT) 0.4 MG SL tablet Place 0.4 mg under the tongue every 5 (five) minutes x 3 doses as needed. For chest pain. 04/04/2014: Has not taken  . ondansetron (ZOFRAN) 8 MG tablet Take 1 tablet (8 mg total) by mouth every 8 (eight) hours as needed for nausea or vomiting.   . pantoprazole (PROTONIX) 40 MG tablet Take 1 tablet (40 mg total) by mouth daily.   . prochlorperazine (COMPAZINE) 10 MG tablet Take 1 tablet (10 mg total) by mouth every 6 (six) hours as needed for nausea or vomiting.   . senna-docusate (SENOKOT-S) 8.6-50 MG tablet Take 1 tablet by mouth at bedtime as needed for mild constipation.   . sulfamethoxazole-trimethoprim (BACTRIM DS,SEPTRA DS) 800-160 MG tablet Take 1 tablet 2 (two) times daily by mouth.   . Vitamin D, Ergocalciferol, (DRISDOL) 50000  UNITS CAPS Take 50,000 Units by mouth 2 (two) times a week. Tuesdays and Thurdays    No facility-administered encounter medications on file as of 12/08/2016.      ALLERGIES:  Allergies  Allergen Reactions  . Ciprofloxacin Other (See Comments)    Hallucination  . Lipitor [Atorvastatin] Other (See Comments)    Muscle Weakness  . Penicillins Rash    Has patient had a PCN reaction causing immediate rash, facial/tongue/throat swelling, SOB or lightheadedness with hypotension: unknown Has patient had a PCN reaction causing severe rash involving mucus membranes or skin necrosis: unknown Has patient had a PCN reaction that required hospitalization: unknown Has patient had a PCN reaction occurring within the last 10 years: unknown If all of the above answers are "NO", then may proceed with Cephalosporin use. 2     PHYSICAL EXAM:  ECOG Performance status: 2 - Symptomatic; requires assistance   Rn vitals reviewed.  Physical Exam  Constitutional: She is oriented to person, place, and time and well-developed, well-nourished, and in no distress. No distress.  Exam done in wheelchair.   HENT:  Head: Normocephalic and  atraumatic.  Mouth/Throat: Oropharynx is clear and moist. No oropharyngeal exudate.  She is mildly hard of hearing   Eyes: Conjunctivae are normal. Pupils are equal, round, and reactive to light. No scleral icterus.  Neck: Normal range of motion. Neck supple. No JVD present.  Cardiovascular: Normal rate, regular rhythm and normal heart sounds. Exam reveals no gallop and no friction rub.  No murmur heard. Pulmonary/Chest: Effort normal and breath sounds normal. No respiratory distress. She has no wheezes. She has no rales.  Abdominal: Soft. Bowel sounds are normal. She exhibits no distension. There is no tenderness. There is no rebound and no guarding.  Musculoskeletal: Normal range of motion. She exhibits no edema or tenderness.  Lymphadenopathy:    She has no cervical adenopathy.  Neurological: She is alert and oriented to person, place, and time. No cranial nerve deficit.  Skin: Skin is warm and dry. No rash noted. No erythema. No pallor.  Psychiatric: Mood, memory, affect and judgment normal.  Nursing note and vitals reviewed.   LABORATORY DATA:  I have reviewed the labs as listed.  CBC    Component Value Date/Time   WBC 5.8 12/08/2016 1123   RBC 3.49 (L) 12/08/2016 1123   HGB 11.2 (L) 12/08/2016 1123   HCT 34.7 (L) 12/08/2016 1123   PLT 187 12/08/2016 1123   MCV 99.4 12/08/2016 1123   MCH 32.1 12/08/2016 1123   MCHC 32.3 12/08/2016 1123   RDW 14.4 12/08/2016 1123   LYMPHSABS 1.2 12/08/2016 1123   MONOABS 0.6 12/08/2016 1123   EOSABS 0.1 12/08/2016 1123   BASOSABS 0.0 12/08/2016 1123   CMP Latest Ref Rng & Units 12/08/2016 11/10/2016 10/20/2016  Glucose 65 - 99 mg/dL 276(H) 315(H) 242(H)  BUN 6 - 20 mg/dL 28(H) 28(H) 30(H)  Creatinine 0.44 - 1.00 mg/dL 1.98(H) 1.71(H) 1.82(H)  Sodium 135 - 145 mmol/L 134(L) 133(L) 129(L)  Potassium 3.5 - 5.1 mmol/L 3.8 3.6 4.2  Chloride 101 - 111 mmol/L 102 98(L) 96(L)  CO2 22 - 32 mmol/L 26 26 25   Calcium 8.9 - 10.3 mg/dL 8.9 8.8(L) 9.1   Total Protein 6.5 - 8.1 g/dL 6.1(L) 6.4(L) 6.4(L)  Total Bilirubin 0.3 - 1.2 mg/dL 0.7 0.6 0.7  Alkaline Phos 38 - 126 U/L 92 78 80  AST 15 - 41 U/L 30 20 22   ALT 14 - 54 U/L 33 16 13(L)  PENDING LABS:    DIAGNOSTIC IMAGING:  MRI abdomen: 11/19/15     PATHOLOGY:  Liver biopsy: 12/07/15       ASSESSMENT & PLAN:   Stage IV Urothelial carcinoma, multifocal R ureteral masses with liver metastases: -Patient tolerating her treatments well and has improved quality of life with her treatments. Labs reviewed with the patient, proceed with cycle 12 of tecentriq. Check TSH on her next visit. -Her restaging CT scan results 09/06/16 demonstrated overall improvement. The bulky tumor in the right collecting system and right proximal ureter is mildly reduced. Prior pulmonary nodules are no longer visible. Continued reduction in size of the segment 4b hepatic metastatic lesion which also demonstrates reduced conspicuity. -Plan to restage after cycle 12 of tecentriq. Orders placed today.  Return to clinic in 3 weeks for follow-up and her next treatment and to review her restaging scans.   Twana First, MD

## 2016-12-08 NOTE — Patient Instructions (Signed)
[facility] Discharge Instructions for Patients Receiving Chemotherapy   Beginning January 23rd 2017 lab work for the Cancer Center will be done in the  Main lab at [facility] on 1st floor. If you have a lab appointment with the Cancer Center please come in thru the  Main Entrance and check in at the main information desk   Today you received the following chemotherapy agents Tecentriq. Follow-up as scheduled. Call clinic for any questions or concerns  To help prevent nausea and vomiting after your treatment, we encourage you to take your nausea medication   If you develop nausea and vomiting, or diarrhea that is not controlled by your medication, call the clinic.  The clinic phone number is [phone]. Office hours are Monday-Friday 8:30am-5:00pm.  BELOW ARE SYMPTOMS THAT SHOULD BE REPORTED IMMEDIATELY:  *FEVER GREATER THAN 101.0 F  *CHILLS WITH OR WITHOUT FEVER  NAUSEA AND VOMITING THAT IS NOT CONTROLLED WITH YOUR NAUSEA MEDICATION  *UNUSUAL SHORTNESS OF BREATH  *UNUSUAL BRUISING OR BLEEDING  TENDERNESS IN MOUTH AND THROAT WITH OR WITHOUT PRESENCE OF ULCERS  *URINARY PROBLEMS  *BOWEL PROBLEMS  UNUSUAL RASH Items with * indicate a potential emergency and should be followed up as soon as possible. If you have an emergency after office hours please contact your primary care physician or go to the nearest emergency department.  Please call the clinic during office hours if you have any questions or concerns.   You may also contact the Patient Navigator at [phone] should you have any questions or need assistance in obtaining follow up care.      Resources For Cancer Patients and their Caregivers ? American Cancer Society: Can assist with transportation, wigs, general needs, runs Look Good Feel Better.        1-888-227-6333 ? Cancer Care: Provides financial assistance, online support groups, medication/co-pay assistance.  1-800-813-HOPE  (4673) ? [facility] Assists [place] cancer patients and their families through emotional , educational and financial support.  336-427-4357 ? [place] DSS Where to apply for food stamps, Medicaid and utility assistance. 336-342-1394 ? [facility]: Transportation to medical appointments. 336-347-2287 ? Social Security Administration: May apply for disability if have a Stage IV cancer. 336-342-7796 1-800-772-1213 ? [place] [facility]ging, Disability and Transit Services: Assists with nutrition, care and transit needs. 336-349-2343         

## 2016-12-23 ENCOUNTER — Other Ambulatory Visit (HOSPITAL_COMMUNITY): Payer: Self-pay | Admitting: Oncology

## 2016-12-23 ENCOUNTER — Ambulatory Visit (HOSPITAL_COMMUNITY)
Admission: RE | Admit: 2016-12-23 | Discharge: 2016-12-23 | Disposition: A | Discharge status: Home / Self Care | Payer: Medicare Other | Source: Ambulatory Visit | Attending: Oncology | Admitting: Oncology

## 2016-12-23 DIAGNOSIS — C689 Malignant neoplasm of urinary organ, unspecified: Secondary | ICD-10-CM

## 2016-12-23 DIAGNOSIS — K769 Liver disease, unspecified: Secondary | ICD-10-CM | POA: Diagnosis not present

## 2016-12-23 NOTE — Progress Notes (Addendum)
[place], [place] [address]   CLINIC:  Medical Oncology/Hematology  PCP:  Monico Blitz, MD [address] [place] [address] [phone]   REASON FOR VISIT:  Follow-up for Stage IV Urothelial Carcinoma with liver mets  CURRENT THERAPY: Tecentriq IV every 21 days, beginning on 02/04/16. CT scan on 12/23/16 noted progression. Will stop Tecentriq and consult Dr. Grayland Ormond for further treatment planning.    BRIEF ONCOLOGIC HISTORY:    Urothelial cancer ([place])   10/22/2015 Imaging    Presented with recurrent UTI and hematuria. Underwent cystoscopy and retrograde evaluation with Dr. Exie Parody revealed multiple filling defects in distal (R) ureter. Marked irregularly of proximal (R) ureter concerning for malignancy. (+) hydronephrosis noted and tumor noted out of ureteral orifice.        11/19/2015 Imaging    MRI abdomen: 3.2 cm mass in segment 4B of left hepatic lobe, suspicious for liver metastasis. Moderate right hydronephrosis and diffuse renal parenchymal atrophy. Abnormal signal intensity throughout right renal collecting system and proximal right ureter, consistent with known urothelial carcinoma. No other sites of abdominal metastatic disease identified.      12/07/2015 Procedure    Liver biopsy: Poorly differentiated carcinoma consistent with metastatic transitional cell histology.       01/01/2016 Initial Diagnosis    Urothelial carcinoma ([place])      02/04/2016 -  Chemotherapy    atezolizumab (TECENTRIQ) 1,200 mg IV every 21 days        06/09/2016 Imaging    CT C/A/P: Interval decrease in size of hepatic metastatic lesion.  Interval decrease in distention of the right renal collecting system and right ureter with central high attenuation material compatible with known primary urothelial malignancy.  Bilateral pulmonary nodules as above. Recommend attention on follow-up.  Subacute right-sided rib fractures.      09/06/2016 Imaging    CT C/A/P: 1. Overall improvement. The bulky tumor in the right collecting system and right proximal ureter is mildly reduced. Prior pulmonary nodules are no longer visible. Continued reduction in size of the segment 4b hepatic metastatic lesion which also demonstrates reduced conspicuity. 2. Other imaging findings of potential clinical significance: Aortic Atherosclerosis (ICD10-I70.0). Healing response from prior right posterior rib fractures. Hepatic cirrhosis. Gastric varices and splenorenal shunting indicating portal venous hypertension. Severe degenerative left hip arthropathy. Chronic compression fracture at L2. Bony demineralization.      12/23/2016 Imaging    CT C/A/P: Interval increase in size of tumoral expansion of the right renal collecting system. Additionally a tumor within the mid right ureter has increased in size when compared to prior. Slight interval increase in size of hepatic lesion, difficult to visualize without intravenous contrast material.      12/23/2016 Progression    Progression of right renal collecting system, tumor within the mid right ureter has increased in size when compared to prior and slight interval increase in size of hepatic lesion. Stop Tencentriq.         Chemotherapy    The patient had gemcitabine (GEMZAR) 81,862 mg in sodium chloride 0.9 % 100 mL chemo infusion, 1,000 mg/m2, Intravenous,  Once, 0 of 6 cycles         HISTORY OF PRESENT ILLNESS (From Dr. Donald Pore note on 01/27/2016):  [name] 83 y.o. femaleis here because of referral from Dr. Alen Blew for ureteral cancer. The patient lives closer to [facility] and felt it would be more convenient for her and her family to  follow-up in [place].  Per Dr. Hazeline Junker note on 12/18/2015: "83 year old woman with history of hypertension, hyperlipidemia and coronary artery disease. She has been noted to have recurrent UTI and hematuria but subsequently underwent a  cystoscopy and retrograde evaluation by Dr. Exie Parody in October 2017. Retrograde evaluation showed multiple filling defects in the distal right ureter. There is marked irregularity of the proximal right ureter specialist for malignant process. She also had an ultrasound which showed hydronephrosis and her cystoscopy on 10/22/2015 showed a tumor noted out of the ureteral orifice. MRI of the abdomen obtained on 11/19/2015 showed a 3.2 cm mass of the left hepatic lobe suspicious for liver metastasis. Moderate right hydronephrosis was also noted. No other sites of abdominal metastasis noted. She underwent a biopsy of her liver lesion on 12/07/2015 which showed metastatic carcinoma consistent with transitional cell histology. She was referred to me for evaluation regarding these findings. Clinically, she is asymptomatic from this. She is no longer reporting any hematuria, dysuria or abdominal pain. She does not report any flank pain. Her performance status is limited related to arthritis. She ambulates short distances inside her house. She relies on her daughter for most activities of daily living."       INTERVAL HISTORY:  Ms. Taber presents today for follow-up for Stage IV urothelial carcinoma.  She is accompanied by her daughter today. Overall patient states she feels well. She is here for cycle 13 of tecentriq. She has been tolerating tecentriq well without any side effects.  UTI symptoms have improved since last visit but she continues to have urgency with occasional "Dribbling".  Notes dizziness when standing.  States appetite is good but is drinking few fluids.  Patient notes this for approximately 1 week.  Complains of intermittent leg swelling resolved with elevation and compression stockings.  She denies any chest pain, shortness of breath, abdominal pain, focal weakness.    REVIEW OF SYSTEMS:  Review of Systems  Constitutional: Negative for appetite change, chills, fatigue, fever and unexpected weight  change.  HENT:  Negative.  Negative for mouth sores.   Eyes: Negative.   Respiratory: Negative.  Negative for cough and shortness of breath.   Cardiovascular: Positive for leg swelling.  Gastrointestinal: Negative for abdominal pain, blood in stool, constipation, diarrhea, nausea and vomiting.  Endocrine: Negative.   Genitourinary: Positive for bladder incontinence and frequency. Negative for dysuria, hematuria and vaginal bleeding.   Musculoskeletal: Negative for back pain. Arthralgias: chronic arthritis   Skin: Negative.  Negative for rash.  Neurological: Positive for dizziness. Negative for headaches.  Hematological: Does not bruise/bleed easily.  Psychiatric/Behavioral: Negative.  Negative for sleep disturbance.  All other systems reviewed and are negative.   PAST MEDICAL/SURGICAL HISTORY:  Past Medical History:  Diagnosis Date  . Arthritis   . Back pain, chronic   . CAD (coronary artery disease)    NSTEMI 04/2011 with newly diagnosed 3V CAD s/p PCI/DES mLAD 04/11/11 with residual dz (per Dr. Burt Knack, should have consideration of PCI of the occluded RCA based on symptoms and/or consideration of outpatient nuclear perfusion testing after the patient recovers from her infarct)  . Cancer ([place])    kidney ( Nov.2017)  . Chronic kidney disease (CKD), stage IV (severe) ([place]) 12/13/2013  . Hyperglycemia   . Hyperlipidemia   . Hypertension   . Hypothyroidism   . Ischemic cardiomyopathy    EF 45% by cath 04/20/11, 50-55% by echo 04/22/11. hypotension limiting medication titration   . Myocardial infarction ([place]) 2013  . Type  2 diabetes with nephropathy ([place]) 07/26/2014  . UTI (urinary tract infection) 12/13/2013   Past Surgical History:  Procedure Laterality Date  . BACK SURGERY    . BREAST LUMPECTOMY     right  . CORONARY STENT PLACEMENT    . HIP ARTHROPLASTY Right 04/29/2016   Procedure: ARTHROPLASTY BIPOLAR HIP (HEMIARTHROPLASTY);  Surgeon: Carole Civil, MD;  Location: [facility];   Service: Orthopedics;  Laterality: Right;  . LEFT HEART CATHETERIZATION WITH CORONARY ANGIOGRAM N/A 04/20/2011   Procedure: LEFT HEART CATHETERIZATION WITH CORONARY ANGIOGRAM;  Surgeon: Burnell Blanks, MD;  Location[facility] CATH LAB;  Service: Cardiovascular;  Laterality: N/A;  . PERCUTANEOUS CORONARY STENT INTERVENTION (PCI-S) N/A 04/21/2011   Procedure: PERCUTANEOUS CORONARY STENT INTERVENTION (PCI-S);  Surgeon: Sherren Mocha, MD;  Location: [facility] CATH LAB;  Service: Cardiovascular;  Laterality: N/A;     SOCIAL HISTORY:  Social History   Socioeconomic History  . Marital status: Married    Spouse name: Not on file  . Number of children: Not on file  . Years of education: Not on file  . Highest education level: Not on file  Social Needs  . Financial resource strain: Not on file  . Food insecurity - worry: Not on file  . Food insecurity - inability: Not on file  . Transportation needs - medical: Not on file  . Transportation needs - non-medical: Not on file  Occupational History  . Not on file  Tobacco Use  . Smoking status: Never Smoker  . Smokeless tobacco: Never Used  Substance and Sexual Activity  . Alcohol use: No  . Drug use: No  . Sexual activity: No    Birth control/protection: Post-menopausal    Comment: married 70 years-husband at [employer] [employer] [facility] [facility]  Other Topics Concern  . Not on file  Social History Narrative   Lives in [place], [place] with her husband and daughter.    FAMILY HISTORY:  Family History  Problem Relation Age of Onset  . Other Mother   . Cancer Father   . Other Unknown        no known family cardiac disease  . Other Brother        Diptheria  . Depression Sister   . Pneumonia Sister   . Other Brother        murdered    CURRENT MEDICATIONS:  Outpatient Encounter Medications as of 12/29/2016  Medication Sig Note  . amLODipine (NORVASC) 5 MG tablet Take 1 tablet (5 mg total) by mouth daily.   Marland Kitchen aspirin EC 81 MG tablet Take 81 mg by mouth daily.   Marland Kitchen  docusate sodium (COLACE) 100 MG capsule Take 1 capsule (100 mg total) by mouth 2 (two) times daily. (Patient taking differently: Take 100 mg by mouth every other day. )   . doxycycline (VIBRA-TABS) 100 MG tablet Take 100 mg by mouth 2 (two) times daily.   . feeding supplement, ENSURE ENLIVE, (ENSURE ENLIVE) LIQD Take 237 mLs by mouth 2 (two) times daily between meals. 10/20/2016: Only taking one per day   . fish oil-omega-3 fatty acids 1000 MG capsule Take 1 g by mouth daily.   Marland Kitchen HYDROcodone-acetaminophen (NORCO/VICODIN) 5-325 MG tablet Take 1 tablet by mouth every 6 (six) hours as needed for moderate pain.   Marland Kitchen levothyroxine (SYNTHROID, LEVOTHROID) 112 MCG tablet Take 1 tablet (112 mcg total) by mouth daily before breakfast.   . meclizine (ANTIVERT) 25 MG tablet Take 25 mg by mouth 4 (four) times daily as needed. Dizziness   .  metoprolol tartrate (LOPRESSOR) 25 MG tablet Take 1 tablet (25 mg total) by mouth 2 (two) times daily. (Patient taking differently: Take 12.5 mg by mouth 2 (two) times daily. ) 07/28/2016: 07/28/16-Takes 1/2 pill daily  . nitroGLYCERIN (NITROSTAT) 0.4 MG SL tablet Place 0.4 mg under the tongue every 5 (five) minutes x 3 doses as needed. For chest pain. 04/04/2014: Has not taken  . ondansetron (ZOFRAN) 8 MG tablet Take 1 tablet (8 mg total) by mouth every 8 (eight) hours as needed for nausea or vomiting.   . pantoprazole (PROTONIX) 40 MG tablet Take 1 tablet (40 mg total) by mouth daily.   . prochlorperazine (COMPAZINE) 10 MG tablet Take 1 tablet (10 mg total) by mouth every 6 (six) hours as needed for nausea or vomiting.   . senna-docusate (SENOKOT-S) 8.6-50 MG tablet Take 1 tablet by mouth at bedtime as needed for mild constipation.   . sulfamethoxazole-trimethoprim (BACTRIM DS,SEPTRA DS) 800-160 MG tablet Take 1 tablet 2 (two) times daily by mouth.   . Vitamin D, Ergocalciferol, (DRISDOL) 50000 UNITS CAPS Take 50,000 Units by mouth 2 (two) times a week. Tuesdays and Thurdays     No facility-administered encounter medications on file as of 12/29/2016.      ALLERGIES:  Allergies  Allergen Reactions  . Ciprofloxacin Other (See Comments)    Hallucination  . Lipitor [Atorvastatin] Other (See Comments)    Muscle Weakness  . Penicillins Rash    Has patient had a PCN reaction causing immediate rash, facial/tongue/throat swelling, SOB or lightheadedness with hypotension: unknown Has patient had a PCN reaction causing severe rash involving mucus membranes or skin necrosis: unknown Has patient had a PCN reaction that required hospitalization: unknown Has patient had a PCN reaction occurring within the last 10 years: unknown If all of the above answers are "NO", then may proceed with Cephalosporin use. 2     PHYSICAL EXAM:  ECOG Performance status: 2 - Symptomatic; requires assistance   Rn vitals reviewed.  Physical Exam  Constitutional: She is oriented to person, place, and time and well-developed, well-nourished, and in no distress. Vital signs are normal. No distress.  Exam done in wheelchair.   HENT:  Head: Normocephalic and atraumatic.  Mouth/Throat: Oropharynx is clear and moist. No oropharyngeal exudate.  She is mildly hard of hearing   Eyes: Conjunctivae are normal. Pupils are equal, round, and reactive to light. No scleral icterus.  Neck: Normal range of motion. Neck supple. No JVD present.  Cardiovascular: Normal rate, regular rhythm and normal heart sounds. Exam reveals no gallop and no friction rub.  No murmur heard. Pulmonary/Chest: Effort normal and breath sounds normal. No respiratory distress. She has no wheezes. She has no rales.  Abdominal: Soft. Bowel sounds are normal. She exhibits no distension. There is no tenderness. There is no rebound and no guarding.  Musculoskeletal: Normal range of motion. She exhibits edema. She exhibits no tenderness.       Right ankle: She exhibits swelling.       Left ankle: She exhibits swelling.   Lymphadenopathy:    She has no cervical adenopathy.  Neurological: She is alert and oriented to person, place, and time. No cranial nerve deficit.  Skin: Skin is warm and dry. No rash noted. No erythema. No pallor.  Psychiatric: Mood, memory, affect and judgment normal.  Nursing note and vitals reviewed.   LABORATORY DATA:  I have reviewed the labs as listed.  CBC    Component Value Date/Time   WBC 4.3  12/29/2016 0907   RBC 3.08 (L) 12/29/2016 0907   HGB 10.0 (L) 12/29/2016 0907   HCT 31.1 (L) 12/29/2016 0907   PLT 185 12/29/2016 0907   MCV 101.0 (H) 12/29/2016 0907   MCH 32.5 12/29/2016 0907   MCHC 32.2 12/29/2016 0907   RDW 13.6 12/29/2016 0907   LYMPHSABS 0.9 12/29/2016 0907   MONOABS 0.5 12/29/2016 0907   EOSABS 0.2 12/29/2016 0907   BASOSABS 0.0 12/29/2016 0907   CMP Latest Ref Rng & Units 12/29/2016 12/08/2016 11/10/2016  Glucose 65 - 99 mg/dL 231(H) 276(H) 315(H)  BUN 6 - 20 mg/dL 22(H) 28(H) 28(H)  Creatinine 0.44 - 1.00 mg/dL 1.98(H) 1.98(H) 1.71(H)  Sodium 135 - 145 mmol/L 136 134(L) 133(L)  Potassium 3.5 - 5.1 mmol/L 3.9 3.8 3.6  Chloride 101 - 111 mmol/L 103 102 98(L)  CO2 22 - 32 mmol/L 22 26 26   Calcium 8.9 - 10.3 mg/dL 9.1 8.9 8.8(L)  Total Protein 6.5 - 8.1 g/dL 5.8(L) 6.1(L) 6.4(L)  Total Bilirubin 0.3 - 1.2 mg/dL 0.6 0.7 0.6  Alkaline Phos 38 - 126 U/L 73 92 78  AST 15 - 41 U/L 22 30 20   ALT 14 - 54 U/L 11(L) 33 16    PENDING LABS:    DIAGNOSTIC IMAGING:  MRI abdomen: 11/19/15     PATHOLOGY:  Liver biopsy: 12/07/15       ASSESSMENT & PLAN:   Stage IV Urothelial carcinoma, multifocal R ureteral masses with liver metastases: -Patient tolerating her treatments well and has improved quality of life with her treatments. Unfortunately recent retsaging CT scan show progression of disease. Consulted Dr. Grayland Ormond to discuss further treatment options. Will STOP Tecentriq and she WILL NOT receive treatment today. - Long discussion with patient and  daughter about scans and treatment options verse hospice care. They are interested in continued treatment and would like information on chemotherapy options. After consulting Dr. Grayland Ormond, patient will start single agent Gemcitabine day 1, 8 and 15 Q 28 days. Regime pretty well tolerated. Patients performance status is questionable. Information provided about chemotherapy including side effects. Patient will start chemo in approximately two weeks.  - Urgency/incontience: Will re-check UA and Urine Culture. UA shows large leukocytes and positive for nitrites. Will wait for culture before prescribing antibiotic. Culture reveals CITROBACTER FREUNDII with a sensitivity to Nitrofurantoin.  Spoke with [name] patient's daughter and patient has periods of confusion and incontinence but does not complain of dysuria, hesitancy or urgency.  Rx nitrofurantoin 100 mg twice daily for 10 days called into pharmacy.  Patient's daughter states she has tolerated nitrofurantoin in the past. She was last prescribed Bactrim by Dr. Talbert Cage for UTI on 11/10/2016.  Explained to daughter that if symptoms worsen please let us now. - Dizziness: Patient positive for orthostatics. Sitting BP 135/52 and Standing 114/47. Patient has elevated kidney levels and would benefit from 1 liter Normal Saline. Give 1 liter of Normal Saline. Encouraged patient to push fluids at home.  -Will need PORT placement. This will be arranged. Nelson Chimes, RN is coordinating.   Dispo: Return to clinic in 2 weeks for follow-up labs, MD assess and initiation of Gemcitabine.   [name], NP 12/30/2016 9:15 AM

## 2016-12-24 ENCOUNTER — Encounter: Payer: Self-pay | Admitting: Oncology

## 2016-12-28 ENCOUNTER — Other Ambulatory Visit (HOSPITAL_COMMUNITY): Payer: Self-pay | Admitting: *Deleted

## 2016-12-28 DIAGNOSIS — C689 Malignant neoplasm of urinary organ, unspecified: Secondary | ICD-10-CM

## 2016-12-29 ENCOUNTER — Encounter (HOSPITAL_BASED_OUTPATIENT_CLINIC_OR_DEPARTMENT_OTHER): Payer: Medicare Other | Admitting: Oncology

## 2016-12-29 ENCOUNTER — Encounter (HOSPITAL_COMMUNITY): Payer: Self-pay

## 2016-12-29 ENCOUNTER — Other Ambulatory Visit: Payer: Self-pay

## 2016-12-29 ENCOUNTER — Encounter (HOSPITAL_COMMUNITY): Payer: Medicare Other

## 2016-12-29 ENCOUNTER — Encounter (HOSPITAL_BASED_OUTPATIENT_CLINIC_OR_DEPARTMENT_OTHER): Payer: Medicare Other

## 2016-12-29 DIAGNOSIS — I10 Essential (primary) hypertension: Secondary | ICD-10-CM | POA: Diagnosis not present

## 2016-12-29 DIAGNOSIS — E860 Dehydration: Secondary | ICD-10-CM | POA: Diagnosis not present

## 2016-12-29 DIAGNOSIS — C689 Malignant neoplasm of urinary organ, unspecified: Secondary | ICD-10-CM

## 2016-12-29 DIAGNOSIS — C787 Secondary malignant neoplasm of liver and intrahepatic bile duct: Secondary | ICD-10-CM

## 2016-12-29 DIAGNOSIS — I951 Orthostatic hypotension: Secondary | ICD-10-CM

## 2016-12-29 DIAGNOSIS — Z7189 Other specified counseling: Secondary | ICD-10-CM

## 2016-12-29 DIAGNOSIS — R42 Dizziness and giddiness: Secondary | ICD-10-CM | POA: Diagnosis not present

## 2016-12-29 LAB — CBC WITH DIFFERENTIAL/PLATELET
BASOS PCT: 1 %
Basophils Absolute: 0 10*3/uL (ref 0.0–0.1)
EOS ABS: 0.2 10*3/uL (ref 0.0–0.7)
Eosinophils Relative: 4 %
HCT: 31.1 % — ABNORMAL LOW (ref 36.0–46.0)
HEMOGLOBIN: 10 g/dL — AB (ref 12.0–15.0)
Lymphocytes Relative: 20 %
Lymphs Abs: 0.9 10*3/uL (ref 0.7–4.0)
MCH: 32.5 pg (ref 26.0–34.0)
MCHC: 32.2 g/dL (ref 30.0–36.0)
MCV: 101 fL — ABNORMAL HIGH (ref 78.0–100.0)
Monocytes Absolute: 0.5 10*3/uL (ref 0.1–1.0)
Monocytes Relative: 12 %
NEUTROS PCT: 63 %
Neutro Abs: 2.7 10*3/uL (ref 1.7–7.7)
Platelets: 185 10*3/uL (ref 150–400)
RBC: 3.08 MIL/uL — AB (ref 3.87–5.11)
RDW: 13.6 % (ref 11.5–15.5)
WBC: 4.3 10*3/uL (ref 4.0–10.5)

## 2016-12-29 LAB — COMPREHENSIVE METABOLIC PANEL
ALBUMIN: 2.7 g/dL — AB (ref 3.5–5.0)
ALK PHOS: 73 U/L (ref 38–126)
ALT: 11 U/L — AB (ref 14–54)
AST: 22 U/L (ref 15–41)
Anion gap: 11 (ref 5–15)
BUN: 22 mg/dL — ABNORMAL HIGH (ref 6–20)
CALCIUM: 9.1 mg/dL (ref 8.9–10.3)
CO2: 22 mmol/L (ref 22–32)
CREATININE: 1.98 mg/dL — AB (ref 0.44–1.00)
Chloride: 103 mmol/L (ref 101–111)
GFR calc Af Amer: 26 mL/min — ABNORMAL LOW (ref >60)
GFR calc non Af Amer: 22 mL/min — ABNORMAL LOW (ref >60)
GLUCOSE: 231 mg/dL — AB (ref 65–99)
Potassium: 3.9 mmol/L (ref 3.5–5.1)
SODIUM: 136 mmol/L (ref 135–145)
Total Bilirubin: 0.6 mg/dL (ref 0.3–1.2)
Total Protein: 5.8 g/dL — ABNORMAL LOW (ref 6.5–8.1)

## 2016-12-29 LAB — URINALYSIS, ROUTINE W REFLEX MICROSCOPIC
BILIRUBIN URINE: NEGATIVE
GLUCOSE, UA: NEGATIVE mg/dL
Hgb urine dipstick: NEGATIVE
KETONES UR: NEGATIVE mg/dL
NITRITE: POSITIVE — AB
PROTEIN: NEGATIVE mg/dL
Specific Gravity, Urine: 1.017 (ref 1.005–1.030)
pH: 5 (ref 5.0–8.0)

## 2016-12-29 LAB — TSH: TSH: 2.347 u[IU]/mL (ref 0.350–4.500)

## 2016-12-29 MED ORDER — SODIUM CHLORIDE 0.9 % IV SOLN
INTRAVENOUS | Status: AC
  Administered 2016-12-29: 10:00:00 via INTRAVENOUS

## 2016-12-29 NOTE — Patient Instructions (Signed)
[place] at [facility] Discharge Instructions  RECOMMENDATIONS MADE BY THE CONSULTANT AND ANY TEST RESULTS WILL BE SENT TO YOUR REFERRING PHYSICIAN.  Received 3 hours hydration with NS today. Follow-up as scheduled. Call clinic for any questions or concerns  Thank you for choosing [place] at [facility] to provide your oncology and hematology care.  To afford each patient quality time with our provider, please arrive at least 15 minutes before your scheduled appointment time.    If you have a lab appointment with the [facility] please come in thru the  Main Entrance and check in at the main information desk  You need to re-schedule your appointment should you arrive 10 or more minutes late.  We strive to give you quality time with our providers, and arriving late affects you and other patients whose appointments are after yours.  Also, if you no show three or more times for appointments you may be dismissed from the clinic at the providers discretion.     Again, thank you for choosing [facility].  Our hope is that these requests will decrease the amount of time that you wait before being seen by our physicians.       _____________________________________________________________  Should you have questions after your visit to [facility], please contact our office at [phone] between the hours of 8:30 a.m. and 4:30 p.m.  Voicemails left after 4:30 p.m. will not be returned until the following business day.  For prescription refill requests, have your pharmacy contact our office.       Resources For Cancer Patients and their Caregivers ? American Cancer Society: Can assist with transportation, wigs, general needs, runs Look Good Feel Better.        (810) 178-2305 ? Cancer Care: Provides financial assistance, online support groups, medication/co-pay assistance.  1-800-813-HOPE 760-038-7479) ? [employer] Assists [place] cancer patients and their families through emotional , educational and financial support.  669-702-6003 ? [place] DSS Where to apply for food stamps, Medicaid and utility assistance. 657-819-7447 ? [facility]: Transportation to medical appointments. (867) 230-9657 ? Social Security Administration: May apply for disability if have a Stage IV cancer. (506)076-1551 3047065629 ? [employer], Disability and Transit Services: Assists with nutrition, care and transit needs. [facility] Support Programs: @10RELATIVEDAYS @ > Cancer Support Group  2nd Tuesday of the month 1pm-2pm, Journey Room  > Creative Journey  3rd Tuesday of the month 1130am-1pm, Journey Room  > Look Good Feel Better  1st Wednesday of the month 10am-12 noon, Journey Room (Call [place] to register [phone])

## 2016-12-29 NOTE — Patient Instructions (Signed)
Joann Hunter   CHEMOTHERAPY INSTRUCTIONS  You have Stage 4 bladder cancer.  You have had some progression on your scans.  We are going to treat you with gemzar.  This treatment is given on day 1, day 8, and day 15, every 28 days.  This means you have treatment for 3 weeks in a row followed by a week off.  This treatment is with palliative intent, which means you are treatable but not curable.  You will see the doctor regularly throughout treatment.  We monitor your lab work prior to every treatment.  The doctor monitors your response to treatment by the way you are feeling, your blood work, and scans periodically. There will be wait times while you are here for treatment.  It will take about 30 minutes to 1 hour for your labs to result.  Then pharmacy has to mix your medications.    POTENTIAL SIDE EFFECTS OF TREATMENT:  Gemcitabine (Generic Name) Other Names: Gemzar  About This Drug Gemcitabine is used to treat cancer. This drug is given in the vein (IV).  This drug will take about 30 minutes to infuse.    Possible Side Effects (Most Common) . Bone marrow depression. This is a decrease in the number of white blood cells, red blood cells, and platelets. This may raise your risk of infection, make you tired and weak (fatigue), and raise your risk of bleeding. . Nausea and throwing up (vomiting). These symptoms may happen within a few hours after your treatment and may last up to several days. Medicines are available to stop or lessen these side effects. . Loose bowel movements (diarrhea) that may last for a few days . Fluid retention. You may have swelling of your arms, legs, face, chest or abdomen . Raised, red rash on arms, legs, back, or chest . Flu-like symptoms: fever, headache, muscle and joint aches, and fatigue (low energy, feeling weak) . Changes in your liver function. Your doctor will check your liver function as needed.  Possible Side Effects (Less  Common) . Feeling short of breath . Decreased appetite (decreased hunger) . Effects on the nerves are called peripheral neuropathy. You may feel numbness, tingling, or pain in your hands and feet. It may be hard for you to button your clothes, open jars, or walk as usual. The effect on the nerves may get worse with more doses of the drug. These effects get better in some people after the drug is stopped but it does not get better in all people.  Treating Side Effects . Ask your doctor or nurse about medication that is available to help stop or lessen nausea, throwing up, loose bowel movements, headache, muscle and joint aches. . Drink 6-8 cups of fluids each day unless your doctor has told you to limit your fluid intake due to some other health problem. A cup is 8 ounces of fluid. If you throw up or have loose bowel movements you should drink more fluids so that you do not become dehydrated (lack water in the body due to losing too much fluid). . If you get a rash, do not put anything on it unless your doctor or nurse says you may. Keep the area around the rash clean and dry. Ask your doctor for medicine if your rash bothers you. . If you have numbness and tingling in your hands and feet, be careful when cooking, walking, and handling sharp objects and hot liquids.  Food and Drug Interactions  There are no known interactions of gemcitabine with food. This drug may interact with other medicines. Tell your doctor and pharmacist about all the medicines and dietary supplements (vitamins, minerals,herbs and others) that you are taking at this time. The safety and use of dietary supplements and alternative diets are often not known. Using these might affect your cancer or interfere with your treatment. Until more is known, you should not use dietary supplements or alternative diets without your cancer doctor's help.  When to Call the Doctor Call your doctor or nurse right away if you have any of these  symptoms: . Fever of 100.5 F (38 C) or above . Chills . Bleeding or bruising that is not normal . Wheezing or trouble breathing . Rash or itching . Nausea that stops you from eating or drinking . Loose bowel movements (diarrhea) more than 4 times a day or diarrhea with weakness or lightheadedness . Swelling of your arms, legs, face, chest or abdomen (fluid retention) Call your doctor or nurse as soon as possible if any of these symptoms happen: . Numbness, tingling, decreased feeling or weakness in fingers, toes, arms, or legs . Trouble walking or changes in the way you walk, feeling clumsy when buttoning clothes, opening jars, or other routine hand motions . Weight gain of 5 pounds in one week (fluid retention) . Lasting loss of appetite or rapid weight loss of five pounds in a week . Fatigue that interferes with your daily activities  Sexual Problems and Reproduction Concerns . Infertility warning: Sexual problems and reproduction concerns may happen. In both men and women, this drug may affect your ability to have children. This cannot be determined before your treatment. Talk with your doctor or nurse if you plan to have children. Ask for information on sperm or egg banking. . In men, this drug may interfere with your ability to make sperm, but it should not change your ability to have sexual relations. . In women, menstrual bleeding may become irregular or stop while you are getting this drug. Do not assume that you cannot become pregnant if you do not have a menstrual period. . Women may go through signs of menopause (change of life) like vaginal dryness or itching. Vaginal lubricants can be used to lessen vaginal dryness, itching, and pain during sexual relations. . Genetic counseling is available for you to talk about the effects of this drug therapy on future pregnancies. Also, a genetic counselor can look at the possible risk of problems in the unborn baby due to this medicine if an  exposure happens during pregnancy. . Pregnancy warning: This drug may have harmful effects on the unborn child, so effective methods of birth control should be used during your cancer treatment. . Breast feeding warning: It is not known if this drug passes into breast milk. For this reason, women should talk to their doctor about the risks and benefits of breast feeding during treatment with this drug because this drug may enter the breast milk and badly harm a breast feeding baby.   SELF CARE ACTIVITIES WHILE ON CHEMOTHERAPY: Hydration Increase your fluid intake 48 hours prior to treatment and drink at least 8 to 12 cups (64 ounces) of water/decaff beverages per day after treatment. You can still have your cup of coffee or soda but these beverages do not count as part of your 8 to 12 cups that you need to drink daily. No alcohol intake.  Medications Continue taking your normal prescription medication as prescribed.  If you start any new herbal or new supplements please let us know first to make sure it is safe.  Mouth Care Have teeth cleaned professionally before starting treatment. Keep dentures and partial plates clean. Use soft toothbrush and do not use mouthwashes that contain alcohol. Biotene is a good mouthwash that is available at most pharmacies or may be ordered by calling [phone]. Use warm salt water gargles (1 teaspoon salt per 1 quart warm water) before and after meals and at bedtime. Or you may rinse with 2 tablespoons of three-percent hydrogen peroxide mixed in eight ounces of water. If you are still having problems with your mouth or sores in your mouth please call the clinic. If you need dental work, please let the doctor know before you go for your appointment so that we can coordinate the best possible time for you in regards to your chemo regimen. You need to also let your dentist know that you are actively taking chemo. We may need to do labs prior to your dental  appointment.  Skin Care Always use sunscreen that has not expired and with SPF (Sun Protection Factor) of 50 or higher. Wear hats to protect your head from the sun. Remember to use sunscreen on your hands, ears, face, & feet.  Use good moisturizing lotions such as udder cream, eucerin, or even Vaseline. Some chemotherapies can cause dry skin, color changes in your skin and nails.    . Avoid long, hot showers or baths. . Use gentle, fragrance-free soaps and laundry detergent. . Use moisturizers, preferably creams or ointments rather than lotions because the thicker consistency is better at preventing skin dehydration. Apply the cream or ointment within 15 minutes of showering. Reapply moisturizer at night, and moisturize your hands every time after you wash them.  Hair Loss (if your doctor says your hair will fall out)  . If your doctor says that your hair is likely to fall out, decide before you begin chemo whether you want to wear a wig. You may want to shop before treatment to match your hair color. . Hats, turbans, and scarves can also camouflage hair loss, although some people prefer to leave their heads uncovered. If you go bare-headed outdoors, be sure to use sunscreen on your scalp. . Cut your hair short. It eases the inconvenience of shedding lots of hair, but it also can reduce the emotional impact of watching your hair fall out. . Don't perm or color your hair during chemotherapy. Those chemical treatments are already damaging to hair and can enhance hair loss. Once your chemo treatments are done and your hair has grown back, it's OK to resume dyeing or perming hair. With chemotherapy, hair loss is almost always temporary. But when it grows back, it may be a different color or texture. In older adults who still had hair color before chemotherapy, the new growth may be completely gray.  Often, new hair is very fine and soft.  Infection Prevention Please wash your hands for at least 30  seconds using warm soapy water. Handwashing is the #1 way to prevent the spread of germs. Stay away from sick people or people who are getting over a cold. If you develop respiratory systems such as green/yellow mucus production or productive cough or persistent cough let us know and we will see if you need an antibiotic. It is a good idea to keep a pair of gloves on when going into grocery stores/[employer] to decrease your risk of coming into  contact with germs on the carts, etc. Carry alcohol hand gel with you at all times and use it frequently if out in public. If your temperature reaches 100.5 or higher please call the clinic and let us know.  If it is after hours or on the weekend please go to the ER if your temperature is over 100.5.  Please have your own personal thermometer at home to use.    Sex and bodily fluids If you are going to have sex, a condom must be used to protect the person that isn't taking chemotherapy. Chemo can decrease your libido (sex drive). For a few days after chemotherapy, chemotherapy can be excreted through your bodily fluids.  When using the toilet please close the lid and flush the toilet twice.  Do this for a few day after you have had chemotherapy.   Effects of chemotherapy on your sex life Some changes are simple and won't last long. They won't affect your sex life permanently. Sometimes you may feel: . too tired . not strong enough to be very active . sick or sore  . not in the mood . anxious or low Your anxiety might not seem related to sex. For example, you may be worried about the cancer and how your treatment is going. Or you may be worried about money, or about how you family are coping with your illness. These things can cause stress, which can affect your interest in sex. It's important to talk to your partner about how you feel. Remember - the changes to your sex life don't usually last long. There's usually no medical reason to stop having sex during  chemo. The drugs won't have any long term physical effects on your performance or enjoyment of sex. Cancer can't be passed on to your partner during sex  Contraception It's important to use reliable contraception during treatment. Avoid getting pregnant while you or your partner are having chemotherapy. This is because the drugs may harm the baby. Sometimes chemotherapy drugs can leave a man or woman infertile.  This means you would not be able to have children in the future. You might want to talk to someone about permanent infertility. It can be very difficult to learn that you may no longer be able to have children. Some people find counselling helpful. There might be ways to preserve your fertility, although this is easier for men than for women. You may want to speak to a fertility expert. You can talk about sperm banking or harvesting your eggs. You can also ask about other fertility options, such as donor eggs. If you have or have had breast cancer, your doctor might advise you not to take the contraceptive pill. This is because the hormones in it might affect the cancer.  It is not known for sure whether or not chemotherapy drugs can be passed on through semen or secretions from the vagina. Because of this some doctors advise people to use a barrier method if you have sex during treatment. This applies to vaginal, anal or oral sex. Generally, doctors advise a barrier method only for the time you are actually having the treatment and for about a week after your treatment. Advice like this can be worrying, but this does not mean that you have to avoid being intimate with your partner. You can still have close contact with your partner and continue to enjoy sex.     Animals If you have cats or birds we just ask that you not  change the litter or change the cage.  Please have someone else do this for you while you are on chemotherapy.   Food Safety During and After Cancer Treatment Food safety is  important for people both during and after cancer treatment. Cancer and cancer treatments, such as chemotherapy, radiation therapy, and stem cell/bone marrow transplantation, often weaken the immune system. This makes it harder for your body to protect itself from foodborne illness, also called food poisoning. Foodborne illness is caused by eating food that contains harmful bacteria, parasites, or viruses.  Foods to avoid Some foods have a higher risk of becoming tainted with bacteria. These include: Marland Kitchen Unwashed fresh fruit and vegetables, especially leafy vegetables that can hide dirt and other contaminants . Raw sprouts, such as alfalfa sprouts . Raw or undercooked beef, especially ground beef, or other raw or undercooked meat and poultry . Fatty, fried, or spicy foods immediately before or after treatment.  These can sit heavy on your stomach and make you feel nauseous. . Raw or undercooked shellfish, such as oysters. . Sushi and sashimi, which often contain raw fish.  . Unpasteurized beverages, such as unpasteurized fruit juices, raw milk, raw yogurt, or cider . Undercooked eggs, such as soft boiled, over easy, and poached; raw, unpasteurized eggs; or foods made with raw egg, such as homemade raw cookie dough and homemade mayonnaise Simple steps for food safety Shop smart. . Do not buy food stored or displayed in an unclean area. . Do not buy bruised or damaged fruits or vegetables. . Do not buy cans that have cracks, dents, or bulges. . Pick up foods that can spoil at the end of your shopping trip and store them in a cooler on the way home. Prepare and clean up foods carefully. . Rinse all fresh fruits and vegetables under running water, and dry them with a clean towel or paper towel. . Clean the top of cans before opening them. . After preparing food, wash your hands for 20 seconds with hot water and soap. Pay special attention to areas between fingers and under nails. . Clean your  utensils and dishes with hot water and soap. Marland Kitchen Disinfect your kitchen and cutting boards using 1 teaspoon of liquid, unscented bleach mixed into 1 quart of water.   Dispose of old food. . Eat canned and packaged food before its expiration date (the "use by" or "best before" date). . Consume refrigerated leftovers within 3 to 4 days. After that time, throw out the food. Even if the food does not smell or look spoiled, it still may be unsafe. Some bacteria, such as Listeria, can grow even on foods stored in the refrigerator if they are kept for too long. Take precautions when eating out. . At restaurants, avoid buffets and salad bars where food sits out for a long time and comes in contact with many people. Food can become contaminated when someone with a virus, often a norovirus, or another "bug" handles it. . Put any leftover food in a "to-go" container yourself, rather than having the server do it. And, refrigerate leftovers as soon as you get home. . Choose restaurants that are clean and that are willing to prepare your food as you order it cooked.             MEDICATIONS:  Zofran/Ondansetron 8mg  tablet. Take 1 tablet every 8 hours as needed for nausea/vomiting. (#1 nausea med to take, this can constipate)  Compazine/Prochlorperazine 10mg  tablet. Take 1 tablet every 6 hours as needed for nausea/vomiting. (#2 nausea med to take, this can make you sleepy)   EMLA cream. Apply a quarter size amount to port site 1 hour prior to chemo. Do not rub in. Cover with plastic wrap.   Over-the-Counter Meds:  Miralax 1 capful in 8 oz of fluid daily. May increase to two times a day if needed. This is a stool softener. If this doesn't work proceed you can add:  Senokot S-start with 1 tablet two times a day and increase to 4 tablets two times a  day if needed. (total of 8 tablets in a 24 hour period). This is a stimulant laxative.   Call us if this does not help your bowels move.   Imodium 2mg  capsule. Take 2 capsules after the 1st loose stool and then 1 capsule every 2 hours until you go a total of 12 hours without having a loose stool. Call the [facility] if loose stools continue. If diarrhea occurs @ bedtime, take 2 capsules @ bedtime. Then take 2 capsules every 4 hours until morning. Call [facility].         Constipation Sheet *Miralax in 8 oz of fluid daily.  May increase to two times a day if needed.  This is a stool softener.  If this not enough to keep your bowel regular:  You can add:  *Senokot S, start with one tablet twice a day and can increase to 4 tablets twice a day if needed.  This is a stimulant laxative.   Sometimes when you take pain medication you need BOTH a medicine to keep your stool soft and a medicine to help your bowel push it out!  Please call if the above does not work for you.   Do not go more than 2 days without a bowel movement.  It is very important that you do not become constipated.  It will make you feel sick to your stomach (nausea) and can cause abdominal pain and vomiting.    Diarrhea Sheet  If you are having loose stools/diarrhea, please purchase Imodium and begin taking as outlined:  At the first sign of poorly formed or loose stools you should begin taking Imodium(loperamide) 2 mg capsules.  Take two caplets (4mg ) followed by one caplet (2mg ) every 2 hours until you have had no diarrhea for 12 hours.  During the night take two caplets (4mg ) at bedtime and continue every 4 hours during the night until the morning.  Stop taking Imodium only after there is no sign of diarrhea for 12 hours.    Always call the [facility] if you are having loose stools/diarrhea that you can't get under control.  Loose stools/disrrhea leads to dehydration (loss of water) in your body.  We have other  options of trying to get the loose stools/diarrhea to stopped but you must let us know!  Nausea Sheet  Zofran/Ondansetron 8mg  tablet. Take 1 tablet every 8 hours as needed for nausea/vomiting. (#1 nausea med to take, this can constipate)  Compazine/Prochlorperazine 10mg  tablet. Take 1 tablet every 6 hours as needed for nausea/vomiting. (#2 nausea med to take, this can make you sleepy)  You can take these medications together or separately.  We would first like for you to try the Ondansetron by itself and then take the Prochloperizine if needed.  But you are allowed to take both medications at the same time if your nausea is that severe.  If you are having persistent nausea (nausea that does not stop) please take these medications on a staggered schedule so that the nausea medication stays in your body.  Please call the [facility] and let us know the amount of nausea that you are experiencing.  If you begin to vomit, you need to call the [facility] and if it is the weekend and you have vomited more than one time and cant get it to stop-go to the Emergency Room.  Persistent nausea/vomiting can lead to dehydration (loss of fluid in your body) and will make you feel terrible.   Ice chips, sips of clear liquids, foods that are @ room temperature, crackers, and toast tend to be better tolerated.    SYMPTOMS TO REPORT AS SOON AS POSSIBLE AFTER TREATMENT:  FEVER GREATER THAN 100.5 F  CHILLS WITH OR WITHOUT FEVER  NAUSEA AND VOMITING THAT IS NOT CONTROLLED WITH YOUR NAUSEA MEDICATION  UNUSUAL SHORTNESS OF BREATH  UNUSUAL BRUISING OR BLEEDING  TENDERNESS IN MOUTH AND THROAT WITH OR WITHOUT PRESENCE OF ULCERS  URINARY PROBLEMS  BOWEL PROBLEMS  UNUSUAL RASH    Wear comfortable clothing and clothing appropriate for easy access to any Portacath or PICC line. Let us know if there is anything that we can do to make your therapy better!    What to do if you need assistance after hours or on  the weekends: CALL [phone].  HOLD on the line, do not hang up.  You will hear multiple messages but at the end you will be connected with a nurse triage line.  They will contact the doctor if necessary.  Most of the time they will be able to assist you.  Do not call the hospital operator.      I have been informed and understand all of the instructions given to me and have received a copy. I have been instructed to call the clinic [phone] or my family physician as soon as possible for continued medical care, if indicated. I do not have any more questions at this time but understand that I may call the [facility] or the Patient Navigator at [phone] during office hours should I have questions or need assistance in obtaining follow-up care.

## 2016-12-29 NOTE — Progress Notes (Signed)
Joann Hunter tolerated hydration well without complaints or incident.Pt's daughter reported that her mom had complained of some dizziness recently esp in the mornings and usually when that occurs she is either dehydrated or has a UTI.Tecnetriq infusion held today due to progression per NP.Labs reviewed with [name] NP who saw pt today.Urine specimen obtained and hydration given per NP orders VSS upon discharge.Pt discharged via wheelchair in satisfactory condition accompanied by her daughter

## 2016-12-30 ENCOUNTER — Telehealth (HOSPITAL_COMMUNITY): Payer: Self-pay | Admitting: Emergency Medicine

## 2016-12-30 ENCOUNTER — Other Ambulatory Visit (HOSPITAL_COMMUNITY): Payer: Self-pay | Admitting: Emergency Medicine

## 2016-12-30 DIAGNOSIS — C679 Malignant neoplasm of bladder, unspecified: Secondary | ICD-10-CM

## 2017-01-01 LAB — URINE CULTURE

## 2017-01-02 ENCOUNTER — Other Ambulatory Visit (HOSPITAL_COMMUNITY): Payer: Self-pay | Admitting: Pharmacist

## 2017-01-02 ENCOUNTER — Other Ambulatory Visit: Payer: Self-pay | Admitting: Radiology

## 2017-01-02 MED ORDER — NITROFURANTOIN MONOHYD MACRO 100 MG PO CAPS: 100.0000 mg | ORAL_CAPSULE | Freq: Two times a day (BID) | ORAL | 0 refills | Status: DC

## 2017-01-02 NOTE — Addendum Note (Signed)
Addended by: [name] E on: 01/02/2017 09:02 AM   Modules accepted: Orders

## 2017-01-04 ENCOUNTER — Telehealth (HOSPITAL_COMMUNITY): Payer: Self-pay | Admitting: Emergency Medicine

## 2017-01-04 ENCOUNTER — Encounter (HOSPITAL_COMMUNITY): Payer: Self-pay | Admitting: Cardiology

## 2017-01-04 ENCOUNTER — Emergency Department (HOSPITAL_COMMUNITY): Payer: Medicare Other

## 2017-01-04 ENCOUNTER — Inpatient Hospital Stay (HOSPITAL_COMMUNITY): Admission: RE | Admit: 2017-01-04 | Payer: Medicare Other | Source: Ambulatory Visit

## 2017-01-04 ENCOUNTER — Emergency Department (HOSPITAL_COMMUNITY)
Admission: EM | Admit: 2017-01-04 | Discharge: 2017-01-04 | Disposition: A | Discharge status: Home / Self Care | Payer: Medicare Other | Attending: Emergency Medicine | Admitting: Emergency Medicine

## 2017-01-04 DIAGNOSIS — E785 Hyperlipidemia, unspecified: Secondary | ICD-10-CM | POA: Insufficient documentation

## 2017-01-04 DIAGNOSIS — I2510 Atherosclerotic heart disease of native coronary artery without angina pectoris: Secondary | ICD-10-CM | POA: Diagnosis not present

## 2017-01-04 DIAGNOSIS — R42 Dizziness and giddiness: Secondary | ICD-10-CM | POA: Diagnosis present

## 2017-01-04 DIAGNOSIS — I255 Ischemic cardiomyopathy: Secondary | ICD-10-CM | POA: Diagnosis not present

## 2017-01-04 DIAGNOSIS — Z7982 Long term (current) use of aspirin: Secondary | ICD-10-CM | POA: Diagnosis not present

## 2017-01-04 DIAGNOSIS — N3000 Acute cystitis without hematuria: Secondary | ICD-10-CM | POA: Diagnosis not present

## 2017-01-04 DIAGNOSIS — Z79899 Other long term (current) drug therapy: Secondary | ICD-10-CM | POA: Diagnosis not present

## 2017-01-04 DIAGNOSIS — I252 Old myocardial infarction: Secondary | ICD-10-CM | POA: Insufficient documentation

## 2017-01-04 DIAGNOSIS — N184 Chronic kidney disease, stage 4 (severe): Secondary | ICD-10-CM | POA: Diagnosis not present

## 2017-01-04 DIAGNOSIS — E119 Type 2 diabetes mellitus without complications: Secondary | ICD-10-CM | POA: Diagnosis not present

## 2017-01-04 DIAGNOSIS — I129 Hypertensive chronic kidney disease with stage 1 through stage 4 chronic kidney disease, or unspecified chronic kidney disease: Secondary | ICD-10-CM | POA: Insufficient documentation

## 2017-01-04 LAB — CBC WITH DIFFERENTIAL/PLATELET
BASOS PCT: 1 %
Basophils Absolute: 0 10*3/uL (ref 0.0–0.1)
EOS ABS: 0.2 10*3/uL (ref 0.0–0.7)
EOS PCT: 4 %
HCT: 32.5 % — ABNORMAL LOW (ref 36.0–46.0)
Hemoglobin: 10.4 g/dL — ABNORMAL LOW (ref 12.0–15.0)
LYMPHS ABS: 1.1 10*3/uL (ref 0.7–4.0)
Lymphocytes Relative: 24 %
MCH: 31.8 pg (ref 26.0–34.0)
MCHC: 32 g/dL (ref 30.0–36.0)
MCV: 99.4 fL (ref 78.0–100.0)
MONOS PCT: 12 %
Monocytes Absolute: 0.6 10*3/uL (ref 0.1–1.0)
NEUTROS PCT: 59 %
Neutro Abs: 2.8 10*3/uL (ref 1.7–7.7)
PLATELETS: 182 10*3/uL (ref 150–400)
RBC: 3.27 MIL/uL — ABNORMAL LOW (ref 3.87–5.11)
RDW: 14.2 % (ref 11.5–15.5)
WBC: 4.8 10*3/uL (ref 4.0–10.5)

## 2017-01-04 LAB — URINALYSIS, ROUTINE W REFLEX MICROSCOPIC
BILIRUBIN URINE: NEGATIVE
Glucose, UA: NEGATIVE mg/dL
Hgb urine dipstick: NEGATIVE
Ketones, ur: NEGATIVE mg/dL
NITRITE: POSITIVE — AB
PH: 5 (ref 5.0–8.0)
PROTEIN: NEGATIVE mg/dL
Specific Gravity, Urine: 1.013 (ref 1.005–1.030)

## 2017-01-04 LAB — COMPREHENSIVE METABOLIC PANEL
ALBUMIN: 2.9 g/dL — AB (ref 3.5–5.0)
ALT: 10 U/L — ABNORMAL LOW (ref 14–54)
ANION GAP: 11 (ref 5–15)
AST: 20 U/L (ref 15–41)
Alkaline Phosphatase: 72 U/L (ref 38–126)
BUN: 21 mg/dL — ABNORMAL HIGH (ref 6–20)
CALCIUM: 9.6 mg/dL (ref 8.9–10.3)
CHLORIDE: 103 mmol/L (ref 101–111)
CO2: 23 mmol/L (ref 22–32)
Creatinine, Ser: 1.97 mg/dL — ABNORMAL HIGH (ref 0.44–1.00)
GFR calc non Af Amer: 22 mL/min — ABNORMAL LOW (ref >60)
GFR, EST AFRICAN AMERICAN: 26 mL/min — AB (ref >60)
GLUCOSE: 142 mg/dL — AB (ref 65–99)
POTASSIUM: 3.9 mmol/L (ref 3.5–5.1)
SODIUM: 137 mmol/L (ref 135–145)
Total Bilirubin: 0.8 mg/dL (ref 0.3–1.2)
Total Protein: 6.2 g/dL — ABNORMAL LOW (ref 6.5–8.1)

## 2017-01-04 MED ORDER — ACETAMINOPHEN 325 MG PO TABS
650.0000 mg | ORAL_TABLET | Freq: Once | ORAL | Status: AC
  Administered 2017-01-04: 650 mg via ORAL
  Filled 2017-01-04: qty 2

## 2017-01-04 NOTE — ED Provider Notes (Signed)
[facility] EMERGENCY DEPARTMENT Provider Note   CSN: [id number] Arrival date & time: 01/04/17  1025     History   Chief Complaint No chief complaint on file.   HPI Joann Hunter is a 84 y.o. female with a history of CAD with an MI in 2013, chronic kidney disease, type 2 diabetes, currently active urothelial cancer on chemotherapy with an acute UTI as diagnosed by her oncologist 6 days ago presenting with lightheadedness since yesterday.  She had similar symptoms during her visit at the cancer center 6 days ago and was found to be dehydrated, symptoms resolved after getting IV fluids.  She denies room spinning, instead describes lightheadedness and weakness, worsened with standing and walking.  She had a UTI as evidenced by a urine culture collected last week.  Nitrofurantoin was started 2 days ago for this infection, but daughter held yesterday's doses as she became increasingly dizzy and was concerned about it being a side effect of this medication (therefore has only received one antibiotic tablet).  She has had no fevers or chills, denies nausea, vomiting, abdominal pain nor has she had urinary frequency, hematuria or dysuria.  Daughters at the bedside states she has had a poor appetite and has had poor p.o. fluid intake as well.  She does endorse generalized headache, and complains of hunger.  She was supposed to have a Port-A-Cath placed this morning at [facility], and was advised to be n.p.o.  This appointment has been rescheduled since she has missed that appointment.  The history is provided by the patient and a relative.    Past Medical History:  Diagnosis Date  . Arthritis   . Back pain, chronic   . CAD (coronary artery disease)    NSTEMI 04/2011 with newly diagnosed 3V CAD s/p PCI/DES mLAD 04/11/11 with residual dz (per Dr. Burt Knack, should have consideration of PCI of the occluded RCA based on symptoms and/or consideration of outpatient nuclear perfusion testing after the  patient recovers from her infarct)  . Cancer ([place])    kidney ( Nov.2017)  . Chronic kidney disease (CKD), stage IV (severe) ([place]) 12/13/2013  . Hyperglycemia   . Hyperlipidemia   . Hypertension   . Hypothyroidism   . Ischemic cardiomyopathy    EF 45% by cath 04/20/11, 50-55% by echo 04/22/11. hypotension limiting medication titration   . Myocardial infarction ([place]) 2013  . Type 2 diabetes with nephropathy ([place]) 07/26/2014  . UTI (urinary tract infection) 12/13/2013    Patient Active Problem List   Diagnosis Date Noted  . Hypokalemia 06/16/2016  . Hip fracture, unspecified laterality, closed, initial encounter ([place]) 04/28/2016  . Ischemic cardiomyopathy 04/28/2016  . Hyperlipidemia 04/28/2016  . Cancer ([place]) 04/28/2016  . Hip fracture ([place]) 04/28/2016  . Dehydration 04/14/2016  . Orthostasis 02/24/2016  . Acute encephalopathy 02/24/2016  . Confusion 02/21/2016  . Neutropenia ([place]) 02/20/2016  . Thrombocytopenia ([place]) 02/20/2016  . ESBL (extended spectrum beta-lactamase) producing bacteria infection 02/20/2016  . Goals of care, counseling/discussion 02/02/2016  . Urothelial cancer ([place]) 01/01/2016  . Rectal bleeding 11/05/2014  . Absolute anemia 11/05/2014  . Hypothyroidism 11/05/2014  . Metabolic encephalopathy 85/27/7824  . Type 2 diabetes with nephropathy ([place]) 07/26/2014  . Essential hypertension 07/26/2014  . Hyponatremia 07/25/2014  . Vertigo 07/25/2014  . AKI (acute kidney injury) ([place]) 12/13/2013  . Fall 12/13/2013  . Lower urinary tract infectious disease 12/13/2013  . Chronic kidney disease (CKD), stage IV (severe) ([place]) 12/13/2013  . Chest pain at rest 04/26/2011  .  Syncope, near 04/26/2011  . CAD (coronary artery disease) 04/23/2011  . Cardiomyopathy, ischemic 04/23/2011  . Dyslipidemia 04/23/2011  . Myocardial infarction, anterior wall, initial care ([place]) 04/20/2011    Past Surgical History:  Procedure Laterality Date  . BACK SURGERY    . BREAST  LUMPECTOMY     right  . CORONARY STENT PLACEMENT    . HIP ARTHROPLASTY Right 04/29/2016   Procedure: ARTHROPLASTY BIPOLAR HIP (HEMIARTHROPLASTY);  Surgeon: Carole Civil, MD;  Location: [facility];  Service: Orthopedics;  Laterality: Right;  . LEFT HEART CATHETERIZATION WITH CORONARY ANGIOGRAM N/A 04/20/2011   Procedure: LEFT HEART CATHETERIZATION WITH CORONARY ANGIOGRAM;  Surgeon: Burnell Blanks, MD;  Location: [facility] CATH LAB;  Service: Cardiovascular;  Laterality: N/A;  . PERCUTANEOUS CORONARY STENT INTERVENTION (PCI-S) N/A 04/21/2011   Procedure: PERCUTANEOUS CORONARY STENT INTERVENTION (PCI-S);  Surgeon: Sherren Mocha, MD;  Location: [facility] CATH LAB;  Service: Cardiovascular;  Laterality: N/A;    OB History    Gravida Para Term Preterm AB Living   2 2 2     2    SAB TAB Ectopic Multiple Live Births                   Home Medications    Prior to Admission medications   Medication Sig Start Date End Date Taking? Authorizing Provider  amLODipine (NORVASC) 5 MG tablet Take 1 tablet (5 mg total) by mouth daily. 05/03/16  Yes Elgergawy, Silver Huguenin, MD  aspirin EC 81 MG tablet Take 81 mg by mouth daily.   Yes [provider]  docusate sodium (COLACE) 100 MG capsule Take 1 capsule (100 mg total) by mouth 2 (two) times daily. Patient taking differently: Take 100 mg by mouth every other day.  11/06/14  Yes Kathie Dike, MD  feeding supplement, ENSURE ENLIVE, (ENSURE ENLIVE) LIQD Take 237 mLs by mouth 2 (two) times daily between meals. 05/02/16  Yes Elgergawy, Silver Huguenin, MD  fish oil-omega-3 fatty acids 1000 MG capsule Take 1 g by mouth daily.   Yes [provider]  HYDROcodone-acetaminophen (NORCO/VICODIN) 5-325 MG tablet Take 1 tablet by mouth every 6 (six) hours as needed for moderate pain. 06/03/16  Yes Carole Civil, MD  levothyroxine (SYNTHROID, LEVOTHROID) 112 MCG tablet Take 1 tablet (112 mcg total) by mouth daily before breakfast. 09/08/16  Yes Holley Bouche, NP    meclizine (ANTIVERT) 25 MG tablet Take 25 mg by mouth 4 (four) times daily as needed. Dizziness   Yes [provider]  metoprolol tartrate (LOPRESSOR) 25 MG tablet Take 1 tablet (25 mg total) by mouth 2 (two) times daily. Patient taking differently: Take 12.5 mg by mouth 2 (two) times daily.  05/02/16  Yes Elgergawy, Silver Huguenin, MD  nitroGLYCERIN (NITROSTAT) 0.4 MG SL tablet Place 0.4 mg under the tongue every 5 (five) minutes x 3 doses as needed. For chest pain. 04/23/11  Yes Dunn, Dayna N, PA-C  ondansetron (ZOFRAN) 8 MG tablet Take 1 tablet (8 mg total) by mouth every 8 (eight) hours as needed for nausea or vomiting. 01/29/16  Yes Penland, Kelby Fam, MD  pantoprazole (PROTONIX) 40 MG tablet Take 1 tablet (40 mg total) by mouth daily. 11/06/14  Yes Kathie Dike, MD  prochlorperazine (COMPAZINE) 10 MG tablet Take 1 tablet (10 mg total) by mouth every 6 (six) hours as needed for nausea or vomiting. 01/29/16  Yes Penland, Kelby Fam, MD  senna-docusate (SENOKOT-S) 8.6-50 MG tablet Take 1 tablet by mouth at bedtime as needed  for mild constipation. 05/02/16  Yes Elgergawy, Silver Huguenin, MD  Vitamin D, Ergocalciferol, (DRISDOL) 50000 UNITS CAPS Take 50,000 Units by mouth 2 (two) times a week. Tuesdays and Thurdays 05/30/12  Yes [provider]  Gemcitabine HCl (GEMZAR IV) Inject into the vein. Day 1,8,15 every 28 days    [provider]  nitrofurantoin, macrocrystal-monohydrate, (MACROBID) 100 MG capsule Take 1 capsule (100 mg total) by mouth 2 (two) times daily. Patient not taking: Reported on 01/04/2017 01/02/17   Jacquelin Hawking, NP    Family History Family History  Problem Relation Age of Onset  . Other Mother   . Cancer Father   . Other Unknown        no known family cardiac disease  . Other Brother        Diptheria  . Depression Sister   . Pneumonia Sister   . Other Brother        murdered    Social History Social History   Tobacco Use  . Smoking status: Never  Smoker  . Smokeless tobacco: Never Used  Substance Use Topics  . Alcohol use: No  . Drug use: No     Allergies   Ciprofloxacin; Lipitor [atorvastatin]; and Penicillins   Review of Systems Review of Systems  Constitutional: Negative for chills and fever.  HENT: Negative for congestion and sore throat.   Eyes: Negative.   Respiratory: Negative for chest tightness and shortness of breath.   Cardiovascular: Negative for chest pain.  Gastrointestinal: Negative for abdominal pain and nausea.  Genitourinary: Negative.   Musculoskeletal: Negative for arthralgias, joint swelling and neck pain.  Skin: Negative.  Negative for rash and wound.  Neurological: Positive for weakness and light-headedness. Negative for dizziness, numbness and headaches.  Psychiatric/Behavioral: Negative.      Physical Exam Updated Vital Signs BP (!) 147/63   Pulse 62   Temp 98.3 F (36.8 C)   Resp 18   Ht 5\' 2"  (1.575 m)   Wt 78.9 kg (174 lb)   SpO2 95%   BMI 31.83 kg/m   Physical Exam  Constitutional: She appears well-developed and well-nourished.  HENT:  Head: Normocephalic and atraumatic.  Eyes: Conjunctivae are normal.  Neck: Normal range of motion.  Cardiovascular: Normal rate, regular rhythm, normal heart sounds and intact distal pulses.  Pulmonary/Chest: Effort normal and breath sounds normal. She has no wheezes.  Abdominal: Soft. Bowel sounds are normal. There is no tenderness.  Musculoskeletal: Normal range of motion.  Neurological: She is alert.  Skin: Skin is warm and dry.  Psychiatric: She has a normal mood and affect.  Nursing note and vitals reviewed.    ED Treatments / Results  Labs (all labs ordered are listed, but only abnormal results are displayed) Labs Reviewed  CBC WITH DIFFERENTIAL/PLATELET - Abnormal; Notable for the following components:      Result Value   RBC 3.27 (*)    Hemoglobin 10.4 (*)    HCT 32.5 (*)    All other components within normal limits    COMPREHENSIVE METABOLIC PANEL - Abnormal; Notable for the following components:   Glucose, Bld 142 (*)    BUN 21 (*)    Creatinine, Ser 1.97 (*)    Total Protein 6.2 (*)    Albumin 2.9 (*)    ALT 10 (*)    GFR calc non Af Amer 22 (*)    GFR calc Af Amer 26 (*)    All other components within normal limits  URINALYSIS,  ROUTINE W REFLEX MICROSCOPIC - Abnormal; Notable for the following components:   APPearance HAZY (*)    Nitrite POSITIVE (*)    Leukocytes, UA LARGE (*)    Bacteria, UA RARE (*)    Squamous Epithelial / LPF 0-5 (*)    All other components within normal limits    EKG  EKG Interpretation None       Radiology Ct Head Wo Contrast  Result Date: 01/04/2017 CLINICAL DATA:  C/O DIZZINESS TODAY, PATIENT IS CURRENTLY BEING TREATED FOR UTI WITH ANITBX, HX STAGE 4 UROTHELIAL CA WITH LIVER METS AND ONGOING CHEMO. HX DM, HTN, CKD EXAM: CT HEAD WITHOUT CONTRAST TECHNIQUE: Contiguous axial images were obtained from the base of the skull through the vertex without intravenous contrast. COMPARISON:  Head CT dated 04/27/2016. FINDINGS: Brain: Again noted is generalized age related parenchymal atrophy with commensurate dilatation of the ventricles and sulci. Chronic small vessel ischemic changes again noted within the bilateral periventricular and subcortical white matter. Small old lacunar infarct again noted within the right basal ganglia. There is no mass, hemorrhage, edema or other evidence of acute parenchymal abnormality. No extra-axial hemorrhage. Vascular: There are chronic calcified atherosclerotic changes of the large vessels at the skull base. No unexpected hyperdense vessel. Skull: Normal. Negative for fracture or focal lesion. Sinuses/Orbits: No acute finding. Other: None. IMPRESSION: 1. No acute findings. No intracranial mass, hemorrhage or edema. No evidence of intracranial metastasis. 2. Atrophy and chronic ischemic changes, as detailed above. Electronically Signed   By: Franki Cabot M.D.   On: 01/04/2017 13:53    Procedures Procedures (including critical care time)  Medications Ordered in ED Medications  acetaminophen (TYLENOL) tablet 650 mg (650 mg Oral Given 01/04/17 1136)     Initial Impression / Assessment and Plan / ED Course  I have reviewed the triage vital signs and the nursing notes.  Pertinent labs & imaging results that were available during my care of the patient were reviewed by me and considered in my medical decision making (see chart for details).     Labs and imaging reviewed.  Patient has persistent UTI.  She ambulated in the department without dizziness or lightheadedness using a walker, which for ambulation.  Orthostatic vital signs were obtained and she is not orthostatic.  She and her daughters were advised that she needs to continue taking her nitrofurantoin, its highly unlikely that the lightheadedness is from this antibiotic and more likely that is due to the infection itself.  She was encouraged to increase her fluid intake.  Also advised follow-up with her primary is Dr. or her oncologist for ongoing symptoms or concerns.  Strict return precautions also discussed.  Patient was seen by Dr. Cathleen Fears during this ED visit.   Final Clinical Impressions(s) / ED Diagnoses   Final diagnoses:  Acute cystitis without hematuria    ED Discharge Orders    None       Landis Martins 01/04/17 [place], MD 01/04/17 [phone]

## 2017-01-04 NOTE — Patient Instructions (Signed)
[place]   CHEMOTHERAPY INSTRUCTIONS  You have Stage 4 bladder cancer.  You have had some progression on your scans.  We are going to treat you with gemzar.  This treatment is given on day 1, day 8, and day 15, every 28 days.  This means you have treatment for 3 weeks in a row followed by a week off.  This treatment is with palliative intent, which means you are treatable but not curable.  You will see the doctor regularly throughout treatment.  We monitor your lab work prior to every treatment.  The doctor monitors your response to treatment by the way you are feeling, your blood work, and scans periodically. There will be wait times while you are here for treatment.  It will take about 30 minutes to 1 hour for your labs to result.  Then pharmacy has to mix your medications.    POTENTIAL SIDE EFFECTS OF TREATMENT:  Gemcitabine (Generic Name) Other Names: Gemzar  About This Drug Gemcitabine is used to treat cancer. This drug is given in the vein (IV).  This drug will take about 30 minutes to infuse.    Possible Side Effects (Most Common) . Bone marrow depression. This is a decrease in the number of white blood cells, red blood cells, and platelets. This may raise your risk of infection, make you tired and weak (fatigue), and raise your risk of bleeding. . Nausea and throwing up (vomiting). These symptoms may happen within a few hours after your treatment and may last up to several days. Medicines are available to stop or lessen these side effects. . Loose bowel movements (diarrhea) that may last for a few days . Fluid retention. You may have swelling of your arms, legs, face, chest or abdomen . Raised, red rash on arms, legs, back, or chest . Flu-like symptoms: fever, headache, muscle and joint aches, and fatigue (low energy, feeling weak) . Changes in your liver function. Your doctor will check your liver function as needed.  Possible Side Effects (Less  Common) . Feeling short of breath . Decreased appetite (decreased hunger) . Effects on the nerves are called peripheral neuropathy. You may feel numbness, tingling, or pain in your hands and feet. It may be hard for you to button your clothes, open jars, or walk as usual. The effect on the nerves may get worse with more doses of the drug. These effects get better in some people after the drug is stopped but it does not get better in all people.  Treating Side Effects . Ask your doctor or nurse about medication that is available to help stop or lessen nausea, throwing up, loose bowel movements, headache, muscle and joint aches. . Drink 6-8 cups of fluids each day unless your doctor has told you to limit your fluid intake due to some other health problem. A cup is 8 ounces of fluid. If you throw up or have loose bowel movements you should drink more fluids so that you do not become dehydrated (lack water in the body due to losing too much fluid). . If you get a rash, do not put anything on it unless your doctor or nurse says you may. Keep the area around the rash clean and dry. Ask your doctor for medicine if your rash bothers you. . If you have numbness and tingling in your hands and feet, be careful when cooking, walking, and handling sharp objects and hot liquids.  Food and Drug Interactions  There are no known interactions of gemcitabine with food. This drug may interact with other medicines. Tell your doctor and pharmacist about all the medicines and dietary supplements (vitamins, minerals,herbs and others) that you are taking at this time. The safety and use of dietary supplements and alternative diets are often not known. Using these might affect your cancer or interfere with your treatment. Until more is known, you should not use dietary supplements or alternative diets without your cancer doctor's help.  When to Call the Doctor Call your doctor or nurse right away if you have any of these  symptoms: . Fever of 100.5 F (38 C) or above . Chills . Bleeding or bruising that is not normal . Wheezing or trouble breathing . Rash or itching . Nausea that stops you from eating or drinking . Loose bowel movements (diarrhea) more than 4 times a day or diarrhea with weakness or lightheadedness . Swelling of your arms, legs, face, chest or abdomen (fluid retention) Call your doctor or nurse as soon as possible if any of these symptoms happen: . Numbness, tingling, decreased feeling or weakness in fingers, toes, arms, or legs . Trouble walking or changes in the way you walk, feeling clumsy when buttoning clothes, opening jars, or other routine hand motions . Weight gain of 5 pounds in one week (fluid retention) . Lasting loss of appetite or rapid weight loss of five pounds in a week . Fatigue that interferes with your daily activities  Sexual Problems and Reproduction Concerns . Infertility warning: Sexual problems and reproduction concerns may happen. In both men and women, this drug may affect your ability to have children. This cannot be determined before your treatment. Talk with your doctor or nurse if you plan to have children. Ask for information on sperm or egg banking. . In men, this drug may interfere with your ability to make sperm, but it should not change your ability to have sexual relations. . In women, menstrual bleeding may become irregular or stop while you are getting this drug. Do not assume that you cannot become pregnant if you do not have a menstrual period. . Women may go through signs of menopause (change of life) like vaginal dryness or itching. Vaginal lubricants can be used to lessen vaginal dryness, itching, and pain during sexual relations. . Genetic counseling is available for you to talk about the effects of this drug therapy on future pregnancies. Also, a genetic counselor can look at the possible risk of problems in the unborn baby due to this medicine if an  exposure happens during pregnancy. . Pregnancy warning: This drug may have harmful effects on the unborn child, so effective methods of birth control should be used during your cancer treatment. . Breast feeding warning: It is not known if this drug passes into breast milk. For this reason, women should talk to their doctor about the risks and benefits of breast feeding during treatment with this drug because this drug may enter the breast milk and badly harm a breast feeding baby.   SELF CARE ACTIVITIES WHILE ON CHEMOTHERAPY: Hydration Increase your fluid intake 48 hours prior to treatment and drink at least 8 to 12 cups (64 ounces) of water/decaff beverages per day after treatment. You can still have your cup of coffee or soda but these beverages do not count as part of your 8 to 12 cups that you need to drink daily. No alcohol intake.  Medications Continue taking your normal prescription medication as prescribed.  If you start any new herbal or new supplements please let us know first to make sure it is safe.  Mouth Care Have teeth cleaned professionally before starting treatment. Keep dentures and partial plates clean. Use soft toothbrush and do not use mouthwashes that contain alcohol. Biotene is a good mouthwash that is available at most pharmacies or may be ordered by calling [phone]. Use warm salt water gargles (1 teaspoon salt per 1 quart warm water) before and after meals and at bedtime. Or you may rinse with 2 tablespoons of three-percent hydrogen peroxide mixed in eight ounces of water. If you are still having problems with your mouth or sores in your mouth please call the clinic. If you need dental work, please let the doctor know before you go for your appointment so that we can coordinate the best possible time for you in regards to your chemo regimen. You need to also let your dentist know that you are actively taking chemo. We may need to do labs prior to your dental  appointment.  Skin Care Always use sunscreen that has not expired and with SPF (Sun Protection Factor) of 50 or higher. Wear hats to protect your head from the sun. Remember to use sunscreen on your hands, ears, face, & feet.  Use good moisturizing lotions such as udder cream, eucerin, or even Vaseline. Some chemotherapies can cause dry skin, color changes in your skin and nails.    . Avoid long, hot showers or baths. . Use gentle, fragrance-free soaps and laundry detergent. . Use moisturizers, preferably creams or ointments rather than lotions because the thicker consistency is better at preventing skin dehydration. Apply the cream or ointment within 15 minutes of showering. Reapply moisturizer at night, and moisturize your hands every time after you wash them.  Hair Loss (if your doctor says your hair will fall out)  . If your doctor says that your hair is likely to fall out, decide before you begin chemo whether you want to wear a wig. You may want to shop before treatment to match your hair color. . Hats, turbans, and scarves can also camouflage hair loss, although some people prefer to leave their heads uncovered. If you go bare-headed outdoors, be sure to use sunscreen on your scalp. . Cut your hair short. It eases the inconvenience of shedding lots of hair, but it also can reduce the emotional impact of watching your hair fall out. . Don't perm or color your hair during chemotherapy. Those chemical treatments are already damaging to hair and can enhance hair loss. Once your chemo treatments are done and your hair has grown back, it's OK to resume dyeing or perming hair. With chemotherapy, hair loss is almost always temporary. But when it grows back, it may be a different color or texture. In older adults who still had hair color before chemotherapy, the new growth may be completely gray.  Often, new hair is very fine and soft.  Infection Prevention Please wash your hands for at least 30  seconds using warm soapy water. Handwashing is the #1 way to prevent the spread of germs. Stay away from sick people or people who are getting over a cold. If you develop respiratory systems such as green/yellow mucus production or productive cough or persistent cough let us know and we will see if you need an antibiotic. It is a good idea to keep a pair of gloves on when going into grocery stores/[employer] to decrease your risk of coming into  contact with germs on the carts, etc. Carry alcohol hand gel with you at all times and use it frequently if out in public. If your temperature reaches 100.5 or higher please call the clinic and let us know.  If it is after hours or on the weekend please go to the ER if your temperature is over 100.5.  Please have your own personal thermometer at home to use.    Sex and bodily fluids If you are going to have sex, a condom must be used to protect the person that isn't taking chemotherapy. Chemo can decrease your libido (sex drive). For a few days after chemotherapy, chemotherapy can be excreted through your bodily fluids.  When using the toilet please close the lid and flush the toilet twice.  Do this for a few day after you have had chemotherapy.   Effects of chemotherapy on your sex life Some changes are simple and won't last long. They won't affect your sex life permanently. Sometimes you may feel: . too tired . not strong enough to be very active . sick or sore  . not in the mood . anxious or low Your anxiety might not seem related to sex. For example, you may be worried about the cancer and how your treatment is going. Or you may be worried about money, or about how you family are coping with your illness. These things can cause stress, which can affect your interest in sex. It's important to talk to your partner about how you feel. Remember - the changes to your sex life don't usually last long. There's usually no medical reason to stop having sex during  chemo. The drugs won't have any long term physical effects on your performance or enjoyment of sex. Cancer can't be passed on to your partner during sex  Contraception It's important to use reliable contraception during treatment. Avoid getting pregnant while you or your partner are having chemotherapy. This is because the drugs may harm the baby. Sometimes chemotherapy drugs can leave a man or woman infertile.  This means you would not be able to have children in the future. You might want to talk to someone about permanent infertility. It can be very difficult to learn that you may no longer be able to have children. Some people find counselling helpful. There might be ways to preserve your fertility, although this is easier for men than for women. You may want to speak to a fertility expert. You can talk about sperm banking or harvesting your eggs. You can also ask about other fertility options, such as donor eggs. If you have or have had breast cancer, your doctor might advise you not to take the contraceptive pill. This is because the hormones in it might affect the cancer.  It is not known for sure whether or not chemotherapy drugs can be passed on through semen or secretions from the vagina. Because of this some doctors advise people to use a barrier method if you have sex during treatment. This applies to vaginal, anal or oral sex. Generally, doctors advise a barrier method only for the time you are actually having the treatment and for about a week after your treatment. Advice like this can be worrying, but this does not mean that you have to avoid being intimate with your partner. You can still have close contact with your partner and continue to enjoy sex.     Animals If you have cats or birds we just ask that you not  change the litter or change the cage.  Please have someone else do this for you while you are on chemotherapy.   Food Safety During and After Cancer Treatment Food safety is  important for people both during and after cancer treatment. Cancer and cancer treatments, such as chemotherapy, radiation therapy, and stem cell/bone marrow transplantation, often weaken the immune system. This makes it harder for your body to protect itself from foodborne illness, also called food poisoning. Foodborne illness is caused by eating food that contains harmful bacteria, parasites, or viruses.  Foods to avoid Some foods have a higher risk of becoming tainted with bacteria. These include: Marland Kitchen Unwashed fresh fruit and vegetables, especially leafy vegetables that can hide dirt and other contaminants . Raw sprouts, such as alfalfa sprouts . Raw or undercooked beef, especially ground beef, or other raw or undercooked meat and poultry . Fatty, fried, or spicy foods immediately before or after treatment.  These can sit heavy on your stomach and make you feel nauseous. . Raw or undercooked shellfish, such as oysters. . Sushi and sashimi, which often contain raw fish.  . Unpasteurized beverages, such as unpasteurized fruit juices, raw milk, raw yogurt, or cider . Undercooked eggs, such as soft boiled, over easy, and poached; raw, unpasteurized eggs; or foods made with raw egg, such as homemade raw cookie dough and homemade mayonnaise Simple steps for food safety Shop smart. . Do not buy food stored or displayed in an unclean area. . Do not buy bruised or damaged fruits or vegetables. . Do not buy cans that have cracks, dents, or bulges. . Pick up foods that can spoil at the end of your shopping trip and store them in a cooler on the way home. Prepare and clean up foods carefully. . Rinse all fresh fruits and vegetables under running water, and dry them with a clean towel or paper towel. . Clean the top of cans before opening them. . After preparing food, wash your hands for 20 seconds with hot water and soap. Pay special attention to areas between fingers and under nails. . Clean your  utensils and dishes with hot water and soap. Marland Kitchen Disinfect your kitchen and cutting boards using 1 teaspoon of liquid, unscented bleach mixed into 1 quart of water.   Dispose of old food. . Eat canned and packaged food before its expiration date (the "use by" or "best before" date). . Consume refrigerated leftovers within 3 to 4 days. After that time, throw out the food. Even if the food does not smell or look spoiled, it still may be unsafe. Some bacteria, such as Listeria, can grow even on foods stored in the refrigerator if they are kept for too long. Take precautions when eating out. . At restaurants, avoid buffets and salad bars where food sits out for a long time and comes in contact with many people. Food can become contaminated when someone with a virus, often a norovirus, or another "bug" handles it. . Put any leftover food in a "to-go" container yourself, rather than having the server do it. And, refrigerate leftovers as soon as you get home. . Choose restaurants that are clean and that are willing to prepare your food as you order it cooked.             MEDICATIONS:  Zofran/Ondansetron 8mg  tablet. Take 1 tablet every 8 hours as needed for nausea/vomiting. (#1 nausea med to take, this can constipate)  Compazine/Prochlorperazine 10mg  tablet. Take 1 tablet every 6 hours as needed for nausea/vomiting. (#2 nausea med to take, this can make you sleepy)   EMLA cream. Apply a quarter size amount to port site 1 hour prior to chemo. Do not rub in. Cover with plastic wrap.   Over-the-Counter Meds:  Miralax 1 capful in 8 oz of fluid daily. May increase to two times a day if needed. This is a stool softener. If this doesn't work proceed you can add:  Senokot S-start with 1 tablet two times a day and increase to 4 tablets two times a  day if needed. (total of 8 tablets in a 24 hour period). This is a stimulant laxative.   Call us if this does not help your bowels move.   Imodium 2mg  capsule. Take 2 capsules after the 1st loose stool and then 1 capsule every 2 hours until you go a total of 12 hours without having a loose stool. Call the [facility] if loose stools continue. If diarrhea occurs @ bedtime, take 2 capsules @ bedtime. Then take 2 capsules every 4 hours until morning. Call [facility].         Constipation Sheet *Miralax in 8 oz of fluid daily.  May increase to two times a day if needed.  This is a stool softener.  If this not enough to keep your bowel regular:  You can add:  *Senokot S, start with one tablet twice a day and can increase to 4 tablets twice a day if needed.  This is a stimulant laxative.   Sometimes when you take pain medication you need BOTH a medicine to keep your stool soft and a medicine to help your bowel push it out!  Please call if the above does not work for you.   Do not go more than 2 days without a bowel movement.  It is very important that you do not become constipated.  It will make you feel sick to your stomach (nausea) and can cause abdominal pain and vomiting.    Diarrhea Sheet  If you are having loose stools/diarrhea, please purchase Imodium and begin taking as outlined:  At the first sign of poorly formed or loose stools you should begin taking Imodium(loperamide) 2 mg capsules.  Take two caplets (4mg ) followed by one caplet (2mg ) every 2 hours until you have had no diarrhea for 12 hours.  During the night take two caplets (4mg ) at bedtime and continue every 4 hours during the night until the morning.  Stop taking Imodium only after there is no sign of diarrhea for 12 hours.    Always call the [facility] if you are having loose stools/diarrhea that you can't get under control.  Loose stools/disrrhea leads to dehydration (loss of water) in your body.  We have other  options of trying to get the loose stools/diarrhea to stopped but you must let us know!  Nausea Sheet  Zofran/Ondansetron 8mg  tablet. Take 1 tablet every 8 hours as needed for nausea/vomiting. (#1 nausea med to take, this can constipate)  Compazine/Prochlorperazine 10mg  tablet. Take 1 tablet every 6 hours as needed for nausea/vomiting. (#2 nausea med to take, this can make you sleepy)  You can take these medications together or separately.  We would first like for you to try the Ondansetron by itself and then take the Prochloperizine if needed.  But you are allowed to take both medications at the same time if your nausea is that severe.  If you are having persistent nausea (nausea that does not stop) please take these medications on a staggered schedule so that the nausea medication stays in your body.  Please call the [facility] and let us know the amount of nausea that you are experiencing.  If you begin to vomit, you need to call the [facility] and if it is the weekend and you have vomited more than one time and cant get it to stop-go to the Emergency Room.  Persistent nausea/vomiting can lead to dehydration (loss of fluid in your body) and will make you feel terrible.   Ice chips, sips of clear liquids, foods that are @ room temperature, crackers, and toast tend to be better tolerated.    SYMPTOMS TO REPORT AS SOON AS POSSIBLE AFTER TREATMENT:  FEVER GREATER THAN 100.5 F  CHILLS WITH OR WITHOUT FEVER  NAUSEA AND VOMITING THAT IS NOT CONTROLLED WITH YOUR NAUSEA MEDICATION  UNUSUAL SHORTNESS OF BREATH  UNUSUAL BRUISING OR BLEEDING  TENDERNESS IN MOUTH AND THROAT WITH OR WITHOUT PRESENCE OF ULCERS  URINARY PROBLEMS  BOWEL PROBLEMS  UNUSUAL RASH    Wear comfortable clothing and clothing appropriate for easy access to any Portacath or PICC line. Let us know if there is anything that we can do to make your therapy better!    What to do if you need assistance after hours or on  the weekends: CALL [phone].  HOLD on the line, do not hang up.  You will hear multiple messages but at the end you will be connected with a nurse triage line.  They will contact the doctor if necessary.  Most of the time they will be able to assist you.  Do not call the hospital operator.      I have been informed and understand all of the instructions given to me and have received a copy. I have been instructed to call the clinic [phone] or my family physician as soon as possible for continued medical care, if indicated. I do not have any more questions at this time but understand that I may call the [facility] or the Patient Navigator at [phone] during office hours should I have questions or need assistance in obtaining follow-up care.

## 2017-01-04 NOTE — ED Triage Notes (Signed)
Dizziness since yesterday.  Pt seen in  cancer center Thursday and had IV fluids for dehydration.  Also had urine culture done Thursday.  Started antibiotic Monday night for positive urine culture.

## 2017-01-04 NOTE — Telephone Encounter (Signed)
Spoke with daughter who states that pt is very dizzy.  Pt is being currently treated for UTI.  Pt is suppose to have port placed today.  Spoke with [name] NP and she should go to the ER to be evaluated.  Daughter notified and verbalized understanding.  We will get port rescheduled and chemo rescheduled and notify the family.

## 2017-01-04 NOTE — ED Provider Notes (Signed)
Complains of dizziness meaning lightheadedness for the past several days.  Recently treated with urinary tract infection.  Took 1 dose of Macrobid.  To coming here..  Patient walks with walker at baseline.  Patient has urine culture from 12/29/2016 which shows sensitivity to nitrofurantoin   Joann Dakin, MD 01/04/17 1426

## 2017-01-04 NOTE — ED Notes (Signed)
Pt ambulated with walker. Dizzy with standing but subsided quickly per pt. Was steady with walker. PA in room as well

## 2017-01-04 NOTE — Discharge Instructions (Signed)
It is important to continue taking your antibiotics for this urinary infection.  Also discussed it is very important that you increase your fluid intake to avoid dehydration and also to flush this infection from your bladder.  Get rechecked for any fevers, nausea or vomiting, increasing rather than improving weakness or any new or worsening symptoms.

## 2017-01-08 ENCOUNTER — Encounter (HOSPITAL_COMMUNITY): Payer: Self-pay | Admitting: Cardiology

## 2017-01-08 ENCOUNTER — Other Ambulatory Visit: Payer: Self-pay

## 2017-01-08 ENCOUNTER — Emergency Department (HOSPITAL_COMMUNITY): Payer: Medicare Other

## 2017-01-08 ENCOUNTER — Emergency Department (HOSPITAL_COMMUNITY)
Admission: EM | Admit: 2017-01-08 | Discharge: 2017-01-08 | Disposition: A | Discharge status: Home / Self Care | Payer: Medicare Other | Attending: Emergency Medicine | Admitting: Emergency Medicine

## 2017-01-08 DIAGNOSIS — I252 Old myocardial infarction: Secondary | ICD-10-CM | POA: Insufficient documentation

## 2017-01-08 DIAGNOSIS — I129 Hypertensive chronic kidney disease with stage 1 through stage 4 chronic kidney disease, or unspecified chronic kidney disease: Secondary | ICD-10-CM | POA: Diagnosis not present

## 2017-01-08 DIAGNOSIS — E785 Hyperlipidemia, unspecified: Secondary | ICD-10-CM | POA: Diagnosis not present

## 2017-01-08 DIAGNOSIS — S5011XA Contusion of right forearm, initial encounter: Secondary | ICD-10-CM | POA: Insufficient documentation

## 2017-01-08 DIAGNOSIS — W19XXXA Unspecified fall, initial encounter: Secondary | ICD-10-CM

## 2017-01-08 DIAGNOSIS — Y939 Activity, unspecified: Secondary | ICD-10-CM | POA: Insufficient documentation

## 2017-01-08 DIAGNOSIS — E119 Type 2 diabetes mellitus without complications: Secondary | ICD-10-CM | POA: Diagnosis not present

## 2017-01-08 DIAGNOSIS — I255 Ischemic cardiomyopathy: Secondary | ICD-10-CM | POA: Diagnosis not present

## 2017-01-08 DIAGNOSIS — Z7982 Long term (current) use of aspirin: Secondary | ICD-10-CM | POA: Diagnosis not present

## 2017-01-08 DIAGNOSIS — W010XXA Fall on same level from slipping, tripping and stumbling without subsequent striking against object, initial encounter: Secondary | ICD-10-CM | POA: Insufficient documentation

## 2017-01-08 DIAGNOSIS — I2510 Atherosclerotic heart disease of native coronary artery without angina pectoris: Secondary | ICD-10-CM | POA: Insufficient documentation

## 2017-01-08 DIAGNOSIS — S59911A Unspecified injury of right forearm, initial encounter: Secondary | ICD-10-CM | POA: Diagnosis present

## 2017-01-08 DIAGNOSIS — Z79899 Other long term (current) drug therapy: Secondary | ICD-10-CM | POA: Diagnosis not present

## 2017-01-08 DIAGNOSIS — Y92009 Unspecified place in unspecified non-institutional (private) residence as the place of occurrence of the external cause: Secondary | ICD-10-CM | POA: Diagnosis not present

## 2017-01-08 DIAGNOSIS — Y998 Other external cause status: Secondary | ICD-10-CM | POA: Diagnosis not present

## 2017-01-08 DIAGNOSIS — E039 Hypothyroidism, unspecified: Secondary | ICD-10-CM | POA: Insufficient documentation

## 2017-01-08 DIAGNOSIS — N184 Chronic kidney disease, stage 4 (severe): Secondary | ICD-10-CM | POA: Insufficient documentation

## 2017-01-08 LAB — BASIC METABOLIC PANEL
Anion gap: 9 (ref 5–15)
BUN: 17 mg/dL (ref 6–20)
CHLORIDE: 102 mmol/L (ref 101–111)
CO2: 25 mmol/L (ref 22–32)
CREATININE: 2.01 mg/dL — AB (ref 0.44–1.00)
Calcium: 9.4 mg/dL (ref 8.9–10.3)
GFR calc non Af Amer: 22 mL/min — ABNORMAL LOW (ref >60)
GFR, EST AFRICAN AMERICAN: 25 mL/min — AB (ref >60)
GLUCOSE: 200 mg/dL — AB (ref 65–99)
Potassium: 4.4 mmol/L (ref 3.5–5.1)
Sodium: 136 mmol/L (ref 135–145)

## 2017-01-08 LAB — CBC WITH DIFFERENTIAL/PLATELET
Basophils Absolute: 0 10*3/uL (ref 0.0–0.1)
Basophils Relative: 1 %
EOS ABS: 0.2 10*3/uL (ref 0.0–0.7)
Eosinophils Relative: 5 %
HEMATOCRIT: 31.6 % — AB (ref 36.0–46.0)
HEMOGLOBIN: 10.1 g/dL — AB (ref 12.0–15.0)
LYMPHS ABS: 1 10*3/uL (ref 0.7–4.0)
Lymphocytes Relative: 25 %
MCH: 32 pg (ref 26.0–34.0)
MCHC: 32 g/dL (ref 30.0–36.0)
MCV: 100 fL (ref 78.0–100.0)
MONO ABS: 0.7 10*3/uL (ref 0.1–1.0)
Monocytes Relative: 17 %
NEUTROS PCT: 52 %
Neutro Abs: 2.1 10*3/uL (ref 1.7–7.7)
Platelets: 188 10*3/uL (ref 150–400)
RBC: 3.16 MIL/uL — ABNORMAL LOW (ref 3.87–5.11)
RDW: 14.1 % (ref 11.5–15.5)
WBC: 4.1 10*3/uL (ref 4.0–10.5)

## 2017-01-08 LAB — PROTIME-INR
INR: 1
PROTHROMBIN TIME: 13.1 s (ref 11.4–15.2)

## 2017-01-08 NOTE — ED Provider Notes (Signed)
[facility] EMERGENCY DEPARTMENT Provider Note   CSN: [id number] Arrival date & time: 01/08/17  1803     History   Chief Complaint Chief Complaint  Patient presents with  . Fall    HPI Joann Hunter is a 84 y.o. female.  Patient has had a recent illness and has been feeling a little bit weak.  But overall feeling better.  Patient was evaluated on January 2 for some dizziness.  Patient stumbled at home and fell on her right hip.  Family members are concerned about right hip injury.  Patient's had a hip replacement on the left side.  Patient also bruised her right forearm.  Patient denies any pain in either hip.  Other than some baseline stiffness to the left hip.  Denies any back pain abdominal pain chest pain neck pain states she did not hit her head there was no loss of consciousness.  Patient is to start cancer treatment this week.  Patient is being followed for renal cancer.      Past Medical History:  Diagnosis Date  . Arthritis   . Back pain, chronic   . CAD (coronary artery disease)    NSTEMI 04/2011 with newly diagnosed 3V CAD s/p PCI/DES mLAD 04/11/11 with residual dz (per Dr. Burt Knack, should have consideration of PCI of the occluded RCA based on symptoms and/or consideration of outpatient nuclear perfusion testing after the patient recovers from her infarct)  . Cancer ([place])    kidney ( Nov.2017)  . Chronic kidney disease (CKD), stage IV (severe) ([place]) 12/13/2013  . Hyperglycemia   . Hyperlipidemia   . Hypertension   . Hypothyroidism   . Ischemic cardiomyopathy    EF 45% by cath 04/20/11, 50-55% by echo 04/22/11. hypotension limiting medication titration   . Myocardial infarction ([place]) 2013  . Type 2 diabetes with nephropathy ([place]) 07/26/2014  . UTI (urinary tract infection) 12/13/2013    Patient Active Problem List   Diagnosis Date Noted  . Hypokalemia 06/16/2016  . Hip fracture, unspecified laterality, closed, initial encounter ([place]) 04/28/2016  . Ischemic  cardiomyopathy 04/28/2016  . Hyperlipidemia 04/28/2016  . Cancer ([place]) 04/28/2016  . Hip fracture ([place]) 04/28/2016  . Dehydration 04/14/2016  . Orthostasis 02/24/2016  . Acute encephalopathy 02/24/2016  . Confusion 02/21/2016  . Neutropenia ([place]) 02/20/2016  . Thrombocytopenia ([place]) 02/20/2016  . ESBL (extended spectrum beta-lactamase) producing bacteria infection 02/20/2016  . Goals of care, counseling/discussion 02/02/2016  . Urothelial cancer ([place]) 01/01/2016  . Rectal bleeding 11/05/2014  . Absolute anemia 11/05/2014  . Hypothyroidism 11/05/2014  . Metabolic encephalopathy 30/16/0109  . Type 2 diabetes with nephropathy ([place]) 07/26/2014  . Essential hypertension 07/26/2014  . Hyponatremia 07/25/2014  . Vertigo 07/25/2014  . AKI (acute kidney injury) ([place]) 12/13/2013  . Fall 12/13/2013  . Lower urinary tract infectious disease 12/13/2013  . Chronic kidney disease (CKD), stage IV (severe) ([place]) 12/13/2013  . Chest pain at rest 04/26/2011  . Syncope, near 04/26/2011  . CAD (coronary artery disease) 04/23/2011  . Cardiomyopathy, ischemic 04/23/2011  . Dyslipidemia 04/23/2011  . Myocardial infarction, anterior wall, initial care ([place]) 04/20/2011    Past Surgical History:  Procedure Laterality Date  . BACK SURGERY    . BREAST LUMPECTOMY     right  . CORONARY STENT PLACEMENT    . HIP ARTHROPLASTY Right 04/29/2016   Procedure: ARTHROPLASTY BIPOLAR HIP (HEMIARTHROPLASTY);  Surgeon: Carole Civil, MD;  Location: [facility];  Service: Orthopedics;  Laterality: Right;  . LEFT HEART CATHETERIZATION  WITH CORONARY ANGIOGRAM N/A 04/20/2011   Procedure: LEFT HEART CATHETERIZATION WITH CORONARY ANGIOGRAM;  Surgeon: Burnell Blanks, MD;  Location: [facility] CATH LAB;  Service: Cardiovascular;  Laterality: N/A;  . PERCUTANEOUS CORONARY STENT INTERVENTION (PCI-S) N/A 04/21/2011   Procedure: PERCUTANEOUS CORONARY STENT INTERVENTION (PCI-S);  Surgeon: Sherren Mocha, MD;  Location: [facility] CATH LAB;   Service: Cardiovascular;  Laterality: N/A;    OB History    Gravida Para Term Preterm AB Living   2 2 2     2    SAB TAB Ectopic Multiple Live Births                   Home Medications    Prior to Admission medications   Medication Sig Start Date End Date Taking? Authorizing Provider  amLODipine (NORVASC) 5 MG tablet Take 1 tablet (5 mg total) by mouth daily. 05/03/16   Elgergawy, Silver Huguenin, MD  aspirin EC 81 MG tablet Take 81 mg by mouth daily.    [provider]  docusate sodium (COLACE) 100 MG capsule Take 1 capsule (100 mg total) by mouth 2 (two) times daily. Patient taking differently: Take 100 mg by mouth every other day.  11/06/14   Kathie Dike, MD  feeding supplement, ENSURE ENLIVE, (ENSURE ENLIVE) LIQD Take 237 mLs by mouth 2 (two) times daily between meals. 05/02/16   Elgergawy, Silver Huguenin, MD  fish oil-omega-3 fatty acids 1000 MG capsule Take 1 g by mouth daily.    [provider]  Gemcitabine HCl (GEMZAR IV) Inject into the vein. Day 1,8,15 every 28 days    [provider]  HYDROcodone-acetaminophen (NORCO/VICODIN) 5-325 MG tablet Take 1 tablet by mouth every 6 (six) hours as needed for moderate pain. 06/03/16   Carole Civil, MD  levothyroxine (SYNTHROID, LEVOTHROID) 112 MCG tablet Take 1 tablet (112 mcg total) by mouth daily before breakfast. 09/08/16   Holley Bouche, NP  meclizine (ANTIVERT) 25 MG tablet Take 25 mg by mouth 4 (four) times daily as needed. Dizziness    [provider]  metoprolol tartrate (LOPRESSOR) 25 MG tablet Take 1 tablet (25 mg total) by mouth 2 (two) times daily. Patient taking differently: Take 12.5 mg by mouth 2 (two) times daily.  05/02/16   Elgergawy, Silver Huguenin, MD  nitrofurantoin, macrocrystal-monohydrate, (MACROBID) 100 MG capsule Take 1 capsule (100 mg total) by mouth 2 (two) times daily. Patient not taking: Reported on 01/04/2017 01/02/17   Jacquelin Hawking, NP  nitroGLYCERIN (NITROSTAT) 0.4 MG SL tablet  Place 0.4 mg under the tongue every 5 (five) minutes x 3 doses as needed. For chest pain. 04/23/11   Dunn, Nedra Hai, PA-C  ondansetron (ZOFRAN) 8 MG tablet Take 1 tablet (8 mg total) by mouth every 8 (eight) hours as needed for nausea or vomiting. 01/29/16   Penland, Kelby Fam, MD  pantoprazole (PROTONIX) 40 MG tablet Take 1 tablet (40 mg total) by mouth daily. 11/06/14   Kathie Dike, MD  prochlorperazine (COMPAZINE) 10 MG tablet Take 1 tablet (10 mg total) by mouth every 6 (six) hours as needed for nausea or vomiting. 01/29/16   Penland, Kelby Fam, MD  senna-docusate (SENOKOT-S) 8.6-50 MG tablet Take 1 tablet by mouth at bedtime as needed for mild constipation. 05/02/16   Elgergawy, Silver Huguenin, MD  Vitamin D, Ergocalciferol, (DRISDOL) 50000 UNITS CAPS Take 50,000 Units by mouth 2 (two) times a week. Tuesdays and Thurdays 05/30/12   [provider]    Family History Family History  Problem Relation Age of Onset  . Other Mother   . Cancer Father   . Other Unknown        no known family cardiac disease  . Other Brother        Diptheria  . Depression Sister   . Pneumonia Sister   . Other Brother        murdered    Social History Social History   Tobacco Use  . Smoking status: Never Smoker  . Smokeless tobacco: Never Used  Substance Use Topics  . Alcohol use: No  . Drug use: No     Allergies   Ciprofloxacin; Lipitor [atorvastatin]; and Penicillins   Review of Systems Review of Systems  Constitutional: Positive for fatigue. Negative for fever.  HENT: Negative for congestion.   Eyes: Negative for redness.  Respiratory: Negative for shortness of breath.   Cardiovascular: Negative for chest pain.  Gastrointestinal: Negative for abdominal pain.  Genitourinary: Negative for dysuria.  Musculoskeletal: Negative for back pain and neck pain.  Skin: Negative for wound.  Allergic/Immunologic: Positive for immunocompromised state.  Neurological: Positive for dizziness and  weakness. Negative for headaches.  Hematological: Does not bruise/bleed easily.  Psychiatric/Behavioral: Negative for confusion.     Physical Exam Updated Vital Signs BP (!) 153/62 (BP Location: Right Arm)   Pulse 70   Temp 98.7 F (37.1 C) (Oral)   Resp 18   Ht 1.575 m (5\' 2" )   Wt 78.9 kg (174 lb)   SpO2 98%   BMI 31.83 kg/m   Physical Exam  Constitutional: She appears well-developed and well-nourished. No distress.  HENT:  Head: Normocephalic and atraumatic.  Eyes: Conjunctivae and EOM are normal. Pupils are equal, round, and reactive to light.  Neck: Normal range of motion. Neck supple.  Cardiovascular: Normal rate.  Pulmonary/Chest: Effort normal and breath sounds normal. No respiratory distress.  Abdominal: Soft. Bowel sounds are normal. There is no tenderness.  Musculoskeletal: Normal range of motion. She exhibits no tenderness or deformity.  Good range of motion of both legs and hips without any significant pain.  There is some bruising to the right forearm.  No skin tears.  Good range of motion of fingers wrist elbow and shoulder no concerns for any bony injuries.  Nursing note and vitals reviewed.    ED Treatments / Results  Labs (all labs ordered are listed, but only abnormal results are displayed) Labs Reviewed  BASIC METABOLIC PANEL - Abnormal; Notable for the following components:      Result Value   Glucose, Bld 200 (*)    Creatinine, Ser 2.01 (*)    GFR calc non Af Amer 22 (*)    GFR calc Af Amer 25 (*)    All other components within normal limits  CBC WITH DIFFERENTIAL/PLATELET - Abnormal; Notable for the following components:   RBC 3.16 (*)    Hemoglobin 10.1 (*)    HCT 31.6 (*)    All other components within normal limits  PROTIME-INR    EKG  EKG Interpretation None       Radiology Dg Hip Unilat With Pelvis 2-3 Views Right  Result Date: 01/08/2017 CLINICAL DATA:  Fall with right hip pain. EXAM: DG HIP (WITH OR WITHOUT PELVIS) 2-3V RIGHT  COMPARISON:  Pelvis film from 04/29/2016. CT scout film from 09/06/2016. FINDINGS: Frontal pelvis shows diffuse bony demineralization. SI joints and symphysis pubis unremarkable. Degenerative changes noted in the left hip. AP and frog-leg lateral views of the right hip show  the patient to be status post hemiarthroplasty. No evidence for periprosthetic fracture. Position of the femoral head component is similar to the CT scout image taking into account differential positioning. IMPRESSION: No acute bony abnormality. Electronically Signed   By: Misty Stanley M.D.   On: 01/08/2017 19:05    Procedures Procedures (including critical care time)  Medications Ordered in ED Medications - No data to display   Initial Impression / Assessment and Plan / ED Course  I have reviewed the triage vital signs and the nursing notes.  Pertinent labs & imaging results that were available during my care of the patient were reviewed by me and considered in my medical decision making (see chart for details).    Patient's labs and x-rays of her pelvis and right hip without any bony injuries.  Clinically no real significant tenderness to the right hip or pelvis.  Patient stable for discharge home and follow-up with hematology oncology for her renal cancer.   Final Clinical Impressions(s) / ED Diagnoses   Final diagnoses:  Fall, initial encounter  Contusion of right forearm, initial encounter    ED Discharge Orders    None       Fredia Sorrow, MD 01/09/17 0130

## 2017-01-08 NOTE — ED Notes (Signed)
Family at bedside. 

## 2017-01-08 NOTE — ED Triage Notes (Signed)
Pt fell at home today.  C/o right hip pain.

## 2017-01-08 NOTE — Progress Notes (Deleted)
[place], [place] [address]   CLINIC:  Medical Oncology/Hematology  PCP:  Monico Blitz, MD [address] [place] [address] [phone]   REASON FOR VISIT:  Follow-up for Stage IV Urothelial Carcinoma with liver mets  CURRENT THERAPY: Tecentriq IV every 21 days, beginning on 02/04/16. CT scan on 12/23/16 noted progression. Will stop Tecentriq and consult Dr. Grayland Ormond for further treatment planning.    BRIEF ONCOLOGIC HISTORY:    Urothelial cancer (Joann Hunter)   10/22/2015 Imaging    Presented with recurrent UTI and hematuria. Underwent cystoscopy and retrograde evaluation with Dr. Exie Parody revealed multiple filling defects in distal (R) ureter. Marked irregularly of proximal (R) ureter concerning for malignancy. (+) hydronephrosis noted and tumor noted out of ureteral orifice.        11/19/2015 Imaging    MRI abdomen: 3.2 cm mass in segment 4B of left hepatic lobe, suspicious for liver metastasis. Moderate right hydronephrosis and diffuse renal parenchymal atrophy. Abnormal signal intensity throughout right renal collecting system and proximal right ureter, consistent with known urothelial carcinoma. No other sites of abdominal metastatic disease identified.      12/07/2015 Procedure    Liver biopsy: Poorly differentiated carcinoma consistent with metastatic transitional cell histology.       01/01/2016 Initial Diagnosis    Urothelial carcinoma ([place])      02/04/2016 -  Chemotherapy    atezolizumab (TECENTRIQ) 1,200 mg IV every 21 days        06/09/2016 Imaging    CT C/A/P: Interval decrease in size of hepatic metastatic lesion.  Interval decrease in distention of the right renal collecting system and right ureter with central high attenuation material compatible with known primary urothelial malignancy.  Bilateral pulmonary nodules as above. Recommend attention on follow-up.  Subacute right-sided rib fractures.      09/06/2016 Imaging    CT C/A/P: 1. Overall improvement. The bulky tumor in the right collecting system and right proximal ureter is mildly reduced. Prior pulmonary nodules are no longer visible. Continued reduction in size of the segment 4b hepatic metastatic lesion which also demonstrates reduced conspicuity. 2. Other imaging findings of potential clinical significance: Aortic Atherosclerosis (ICD10-I70.0). Healing response from prior right posterior rib fractures. Hepatic cirrhosis. Gastric varices and splenorenal shunting indicating portal venous hypertension. Severe degenerative left hip arthropathy. Chronic compression fracture at L2. Bony demineralization.      12/23/2016 Imaging    CT C/A/P: Interval increase in size of tumoral expansion of the right renal collecting system. Additionally a tumor within the mid right ureter has increased in size when compared to prior. Slight interval increase in size of hepatic lesion, difficult to visualize without intravenous contrast material.      12/23/2016 Progression    Progression of right renal collecting system, tumor within the mid right ureter has increased in size when compared to prior and slight interval increase in size of hepatic lesion. Stop Tencentriq.         Chemotherapy    The patient had gemcitabine (GEMZAR) 1,862 mg in sodium chloride 0.9 % 100 mL chemo infusion, 1,000 mg/m2, Intravenous,  Once, 0 of 6 cycles         HISTORY OF PRESENT ILLNESS (From Dr. Donald Pore note on 01/27/2016):  Joann Hunter 84 y.o. femaleis here because of referral from Dr. Alen Blew for ureteral cancer. The patient lives closer to [facility] and felt it would be more convenient for her and her family to  follow-up in [place].  Per Dr. Hazeline Junker note on 12/18/2015: "84 year old woman with history of hypertension, hyperlipidemia and coronary artery disease. She has been noted to have recurrent UTI and hematuria but subsequently underwent a  cystoscopy and retrograde evaluation by Dr. Exie Parody in October 2017. Retrograde evaluation showed multiple filling defects in the distal right ureter. There is marked irregularity of the proximal right ureter specialist for malignant process. She also had an ultrasound which showed hydronephrosis and her cystoscopy on 10/22/2015 showed a tumor noted out of the ureteral orifice. MRI of the abdomen obtained on 11/19/2015 showed a 3.2 cm mass of the left hepatic lobe suspicious for liver metastasis. Moderate right hydronephrosis was also noted. No other sites of abdominal metastasis noted. She underwent a biopsy of her liver lesion on 12/07/2015 which showed metastatic carcinoma consistent with transitional cell histology. She was referred to me for evaluation regarding these findings. Clinically, she is asymptomatic from this. She is no longer reporting any hematuria, dysuria or abdominal pain. She does not report any flank pain. Her performance status is limited related to arthritis. She ambulates short distances inside her house. She relies on her daughter for most activities of daily living."       INTERVAL HISTORY:  Joann Hunter presents today for follow-up for Stage IV urothelial carcinoma.  She is accompanied by her daughter today. Overall patient states she feels well. She is here for cycle 13 of tecentriq. She has been tolerating tecentriq well without any side effects.  UTI symptoms have improved since last visit but she continues to have urgency with occasional "Dribbling".  Notes dizziness when standing.  States appetite is good but is drinking few fluids.  Patient notes this for approximately 1 week.  Complains of intermittent leg swelling resolved with elevation and compression stockings.  She denies any chest pain, shortness of breath, abdominal pain, focal weakness.    REVIEW OF SYSTEMS:  Review of Systems  Constitutional: Negative for appetite change, chills, fatigue, fever and unexpected weight  change.  HENT:  Negative.  Negative for mouth sores.   Eyes: Negative.   Respiratory: Negative.  Negative for cough and shortness of breath.   Cardiovascular: Positive for leg swelling.  Gastrointestinal: Negative for abdominal pain, blood in stool, constipation, diarrhea, nausea and vomiting.  Endocrine: Negative.   Genitourinary: Positive for bladder incontinence and frequency. Negative for dysuria, hematuria and vaginal bleeding.   Musculoskeletal: Negative for back pain. Arthralgias: chronic arthritis   Skin: Negative.  Negative for rash.  Neurological: Positive for dizziness. Negative for headaches.  Hematological: Does not bruise/bleed easily.  Psychiatric/Behavioral: Negative.  Negative for sleep disturbance.  All other systems reviewed and are negative.   PAST MEDICAL/SURGICAL HISTORY:  Past Medical History:  Diagnosis Date  . Arthritis   . Back pain, chronic   . CAD (coronary artery disease)    NSTEMI 04/2011 with newly diagnosed 3V CAD s/p PCI/DES mLAD 04/11/11 with residual dz (per Dr. Burt Knack, should have consideration of PCI of the occluded RCA based on symptoms and/or consideration of outpatient nuclear perfusion testing after the patient recovers from her infarct)  . Cancer ([place])    kidney ( Nov.2017)  . Chronic kidney disease (CKD), stage IV (severe) ([place]) 12/13/2013  . Hyperglycemia   . Hyperlipidemia   . Hypertension   . Hypothyroidism   . Ischemic cardiomyopathy    EF 45% by cath 04/20/11, 50-55% by echo 04/22/11. hypotension limiting medication titration   . Myocardial infarction ([place]) 2013  . Type  2 diabetes with nephropathy ([place]) 07/26/2014  . UTI (urinary tract infection) 12/13/2013   Past Surgical History:  Procedure Laterality Date  . BACK SURGERY    . BREAST LUMPECTOMY     right  . CORONARY STENT PLACEMENT    . HIP ARTHROPLASTY Right 04/29/2016   Procedure: ARTHROPLASTY BIPOLAR HIP (HEMIARTHROPLASTY);  Surgeon: Carole Civil, MD;  Location: [facility];   Service: Orthopedics;  Laterality: Right;  . LEFT HEART CATHETERIZATION WITH CORONARY ANGIOGRAM N/A 04/20/2011   Procedure: LEFT HEART CATHETERIZATION WITH CORONARY ANGIOGRAM;  Surgeon: Burnell Blanks, MD;  Location: [facility] CATH LAB;  Service: Cardiovascular;  Laterality: N/A;  . PERCUTANEOUS CORONARY STENT INTERVENTION (PCI-S) N/A 04/21/2011   Procedure: PERCUTANEOUS CORONARY STENT INTERVENTION (PCI-S);  Surgeon: Sherren Mocha, MD;  Location: [facility] CATH LAB;  Service: Cardiovascular;  Laterality: N/A;     SOCIAL HISTORY:  Social History   Socioeconomic History  . Marital status: Married    Spouse name: Not on file  . Number of children: Not on file  . Years of education: Not on file  . Highest education level: Not on file  Social Needs  . Financial resource strain: Not on file  . Food insecurity - worry: Not on file  . Food insecurity - inability: Not on file  . Transportation needs - medical: Not on file  . Transportation needs - non-medical: Not on file  Occupational History  . Not on file  Tobacco Use  . Smoking status: Never Smoker  . Smokeless tobacco: Never Used  Substance and Sexual Activity  . Alcohol use: No  . Drug use: No  . Sexual activity: No    Birth control/protection: Post-menopausal    Comment: married 47 years-husband at [employer]  Other Topics Concern  . Not on file  Social History Narrative   Lives in [place], [place] with her husband and daughter.    FAMILY HISTORY:  Family History  Problem Relation Age of Onset  . Other Mother   . Cancer Father   . Other Unknown        no known family cardiac disease  . Other Brother        Diptheria  . Depression Sister   . Pneumonia Sister   . Other Brother        murdered    CURRENT MEDICATIONS:  Outpatient Encounter Medications as of 01/09/2017  Medication Sig Note  . amLODipine (NORVASC) 5 MG tablet Take 1 tablet (5 mg total) by mouth daily.   Marland Kitchen aspirin EC 81 MG tablet Take 81 mg by mouth daily.   Marland Kitchen  docusate sodium (COLACE) 100 MG capsule Take 1 capsule (100 mg total) by mouth 2 (two) times daily. (Patient taking differently: Take 100 mg by mouth every other day. )   . feeding supplement, ENSURE ENLIVE, (ENSURE ENLIVE) LIQD Take 237 mLs by mouth 2 (two) times daily between meals.   . fish oil-omega-3 fatty acids 1000 MG capsule Take 1 g by mouth daily.   Marland Kitchen HYDROcodone-acetaminophen (NORCO/VICODIN) 5-325 MG tablet Take 1 tablet by mouth every 6 (six) hours as needed for moderate pain.   Marland Kitchen levothyroxine (SYNTHROID, LEVOTHROID) 112 MCG tablet Take 1 tablet (112 mcg total) by mouth daily before breakfast.   . meclizine (ANTIVERT) 25 MG tablet Take 25 mg by mouth 4 (four) times daily as needed. Dizziness   . metoprolol tartrate (LOPRESSOR) 25 MG tablet Take 1 tablet (25 mg total) by mouth 2 (two) times daily. (Patient taking differently: Take  12.5 mg by mouth 2 (two) times daily. )   . nitrofurantoin, macrocrystal-monohydrate, (MACROBID) 100 MG capsule Take 1 capsule (100 mg total) by mouth 2 (two) times daily. (Patient not taking: Reported on 01/04/2017) 01/04/2017: Patient's daughter states she took 1 dose on Monday 01/02/17. Patient's daughter stopped giving it to her due to patient being dizzy.  . nitroGLYCERIN (NITROSTAT) 0.4 MG SL tablet Place 0.4 mg under the tongue every 5 (five) minutes x 3 doses as needed. For chest pain. 04/04/2014: Has not taken  . ondansetron (ZOFRAN) 8 MG tablet Take 1 tablet (8 mg total) by mouth every 8 (eight) hours as needed for nausea or vomiting.   . pantoprazole (PROTONIX) 40 MG tablet Take 1 tablet (40 mg total) by mouth daily.   . prochlorperazine (COMPAZINE) 10 MG tablet Take 1 tablet (10 mg total) by mouth every 6 (six) hours as needed for nausea or vomiting.   . senna-docusate (SENOKOT-S) 8.6-50 MG tablet Take 1 tablet by mouth at bedtime as needed for mild constipation.   . Vitamin D, Ergocalciferol, (DRISDOL) 50000 UNITS CAPS Take 50,000 Units by mouth 2 (two)  times a week. Tuesdays and Thurdays    No facility-administered encounter medications on file as of 01/09/2017.      ALLERGIES:  Allergies  Allergen Reactions  . Ciprofloxacin Other (See Comments)    Hallucination  . Lipitor [Atorvastatin] Other (See Comments)    Muscle Weakness  . Penicillins Rash    Has patient had a PCN reaction causing immediate rash, facial/tongue/throat swelling, SOB or lightheadedness with hypotension: unknown Has patient had a PCN reaction causing severe rash involving mucus membranes or skin necrosis: unknown Has patient had a PCN reaction that required hospitalization: unknown Has patient had a PCN reaction occurring within the last 10 years: unknown If all of the above answers are "NO", then may proceed with Cephalosporin use. 2     PHYSICAL EXAM:  ECOG Performance status: 2 - Symptomatic; requires assistance   Rn vitals reviewed.  Physical Exam  Constitutional: She is oriented to person, place, and time and well-developed, well-nourished, and in no distress. Vital signs are normal. No distress.  Exam done in wheelchair.   HENT:  Head: Normocephalic and atraumatic.  Mouth/Throat: Oropharynx is clear and moist. No oropharyngeal exudate.  She is mildly hard of hearing   Eyes: Conjunctivae are normal. Pupils are equal, round, and reactive to light. No scleral icterus.  Neck: Normal range of motion. Neck supple. No JVD present.  Cardiovascular: Normal rate, regular rhythm and normal heart sounds. Exam reveals no gallop and no friction rub.  No murmur heard. Pulmonary/Chest: Effort normal and breath sounds normal. No respiratory distress. She has no wheezes. She has no rales.  Abdominal: Soft. Bowel sounds are normal. She exhibits no distension. There is no tenderness. There is no rebound and no guarding.  Musculoskeletal: Normal range of motion. She exhibits edema. She exhibits no tenderness.       Right ankle: She exhibits swelling.       Left ankle:  She exhibits swelling.  Lymphadenopathy:    She has no cervical adenopathy.  Neurological: She is alert and oriented to person, place, and time. No cranial nerve deficit.  Skin: Skin is warm and dry. No rash noted. No erythema. No pallor.  Psychiatric: Mood, memory, affect and judgment normal.  Nursing note and vitals reviewed.   LABORATORY DATA:  I have reviewed the labs as listed.  CBC    Component Value Date/Time  WBC 4.8 01/04/2017 1142   RBC 3.27 (L) 01/04/2017 1142   HGB 10.4 (L) 01/04/2017 1142   HCT 32.5 (L) 01/04/2017 1142   PLT 182 01/04/2017 1142   MCV 99.4 01/04/2017 1142   MCH 31.8 01/04/2017 1142   MCHC 32.0 01/04/2017 1142   RDW 14.2 01/04/2017 1142   LYMPHSABS 1.1 01/04/2017 1142   MONOABS 0.6 01/04/2017 1142   EOSABS 0.2 01/04/2017 1142   BASOSABS 0.0 01/04/2017 1142   CMP Latest Ref Rng & Units 01/04/2017 12/29/2016 12/08/2016  Glucose 65 - 99 mg/dL 142(H) 231(H) 276(H)  BUN 6 - 20 mg/dL 21(H) 22(H) 28(H)  Creatinine 0.44 - 1.00 mg/dL 1.97(H) 1.98(H) 1.98(H)  Sodium 135 - 145 mmol/L 137 136 134(L)  Potassium 3.5 - 5.1 mmol/L 3.9 3.9 3.8  Chloride 101 - 111 mmol/L 103 103 102  CO2 22 - 32 mmol/L 23 22 26   Calcium 8.9 - 10.3 mg/dL 9.6 9.1 8.9  Total Protein 6.5 - 8.1 g/dL 6.2(L) 5.8(L) 6.1(L)  Total Bilirubin 0.3 - 1.2 mg/dL 0.8 0.6 0.7  Alkaline Phos 38 - 126 U/L 72 73 92  AST 15 - 41 U/L 20 22 30   ALT 14 - 54 U/L 10(L) 11(L) 33    PENDING LABS:    DIAGNOSTIC IMAGING:  MRI abdomen: 11/19/15     PATHOLOGY:  Liver biopsy: 12/07/15       ASSESSMENT & PLAN:   Stage IV Urothelial carcinoma, multifocal R ureteral masses with liver metastases: -Patient tolerating her treatments well and has improved quality of life with her treatments. Unfortunately recent retsaging CT scan show progression of disease. Consulted Dr. Grayland Ormond to discuss further treatment options. Will STOP Tecentriq and she WILL NOT receive treatment today. - Long discussion with  patient and daughter about scans and treatment options verse hospice care. They are interested in continued treatment and would like information on chemotherapy options. After consulting Dr. Grayland Ormond, patient will start single agent Gemcitabine day 1, 8 and 15 Q 28 days. Regime pretty well tolerated. Patients performance status is questionable. Information provided about chemotherapy including side effects. Patient will start chemo in approximately two weeks.  - Urgency/incontience: Will re-check UA and Urine Culture. UA shows large leukocytes and positive for nitrites. Will wait for culture before prescribing antibiotic. Culture reveals CITROBACTER FREUNDII with a sensitivity to Nitrofurantoin.  Spoke with [name] patient's daughter and patient has periods of confusion and incontinence but does not complain of dysuria, hesitancy or urgency.  Rx nitrofurantoin 100 mg twice daily for 10 days called into pharmacy.  Patient's daughter states she has tolerated nitrofurantoin in the past. She was last prescribed Bactrim by Dr. Talbert Cage for UTI on 11/10/2016.  Explained to daughter that if symptoms worsen please let us now. - Dizziness: Patient positive for orthostatics. Sitting BP 135/52 and Standing 114/47. Patient has elevated kidney levels and would benefit from 1 liter Normal Saline. Give 1 liter of Normal Saline. Encouraged patient to push fluids at home.  -Will need PORT placement. This will be arranged. Nelson Chimes, RN is coordinating.   Dispo: Return to clinic in 2 weeks for follow-up labs, MD assess and initiation of Gemcitabine.   [name], NP 01/08/2017 4:15 PM

## 2017-01-08 NOTE — ED Triage Notes (Signed)
Fell at home   R hip pain  PT is supposed to start Cancer tx this week

## 2017-01-08 NOTE — Discharge Instructions (Signed)
X-rays of the right hip and pelvis without any signs of bony injury there.  Follow-up with her doctors as needed.  Return for any new or worse symptoms.

## 2017-01-09 ENCOUNTER — Ambulatory Visit (HOSPITAL_COMMUNITY): Payer: Medicare Other | Admitting: Oncology

## 2017-01-09 ENCOUNTER — Other Ambulatory Visit: Payer: Self-pay

## 2017-01-09 ENCOUNTER — Inpatient Hospital Stay (HOSPITAL_COMMUNITY): Payer: Medicare Other | Attending: Oncology | Admitting: Oncology

## 2017-01-09 ENCOUNTER — Encounter (HOSPITAL_COMMUNITY): Payer: Self-pay | Admitting: Oncology

## 2017-01-09 VITALS — BP 121/47 | HR 60 | Temp 97.9°F | Resp 16

## 2017-01-09 DIAGNOSIS — E785 Hyperlipidemia, unspecified: Secondary | ICD-10-CM | POA: Diagnosis not present

## 2017-01-09 DIAGNOSIS — R32 Unspecified urinary incontinence: Secondary | ICD-10-CM | POA: Insufficient documentation

## 2017-01-09 DIAGNOSIS — Z9181 History of falling: Secondary | ICD-10-CM | POA: Insufficient documentation

## 2017-01-09 DIAGNOSIS — C787 Secondary malignant neoplasm of liver and intrahepatic bile duct: Secondary | ICD-10-CM | POA: Insufficient documentation

## 2017-01-09 DIAGNOSIS — M549 Dorsalgia, unspecified: Secondary | ICD-10-CM | POA: Diagnosis not present

## 2017-01-09 DIAGNOSIS — I10 Essential (primary) hypertension: Secondary | ICD-10-CM

## 2017-01-09 DIAGNOSIS — Z79899 Other long term (current) drug therapy: Secondary | ICD-10-CM | POA: Diagnosis not present

## 2017-01-09 DIAGNOSIS — Z880 Allergy status to penicillin: Secondary | ICD-10-CM | POA: Diagnosis not present

## 2017-01-09 DIAGNOSIS — E1122 Type 2 diabetes mellitus with diabetic chronic kidney disease: Secondary | ICD-10-CM | POA: Diagnosis not present

## 2017-01-09 DIAGNOSIS — N184 Chronic kidney disease, stage 4 (severe): Secondary | ICD-10-CM | POA: Insufficient documentation

## 2017-01-09 DIAGNOSIS — I129 Hypertensive chronic kidney disease with stage 1 through stage 4 chronic kidney disease, or unspecified chronic kidney disease: Secondary | ICD-10-CM | POA: Insufficient documentation

## 2017-01-09 DIAGNOSIS — M1990 Unspecified osteoarthritis, unspecified site: Secondary | ICD-10-CM | POA: Insufficient documentation

## 2017-01-09 DIAGNOSIS — R42 Dizziness and giddiness: Secondary | ICD-10-CM

## 2017-01-09 DIAGNOSIS — R918 Other nonspecific abnormal finding of lung field: Secondary | ICD-10-CM | POA: Insufficient documentation

## 2017-01-09 DIAGNOSIS — Z9225 Personal history of immunosupression therapy: Secondary | ICD-10-CM | POA: Insufficient documentation

## 2017-01-09 DIAGNOSIS — Z794 Long term (current) use of insulin: Secondary | ICD-10-CM | POA: Insufficient documentation

## 2017-01-09 DIAGNOSIS — M7989 Other specified soft tissue disorders: Secondary | ICD-10-CM | POA: Diagnosis not present

## 2017-01-09 DIAGNOSIS — E039 Hypothyroidism, unspecified: Secondary | ICD-10-CM | POA: Insufficient documentation

## 2017-01-09 DIAGNOSIS — Z881 Allergy status to other antibiotic agents status: Secondary | ICD-10-CM | POA: Insufficient documentation

## 2017-01-09 DIAGNOSIS — C689 Malignant neoplasm of urinary organ, unspecified: Secondary | ICD-10-CM | POA: Diagnosis not present

## 2017-01-09 DIAGNOSIS — Z5111 Encounter for antineoplastic chemotherapy: Secondary | ICD-10-CM | POA: Insufficient documentation

## 2017-01-09 DIAGNOSIS — Z9221 Personal history of antineoplastic chemotherapy: Secondary | ICD-10-CM | POA: Insufficient documentation

## 2017-01-09 DIAGNOSIS — I2510 Atherosclerotic heart disease of native coronary artery without angina pectoris: Secondary | ICD-10-CM | POA: Diagnosis not present

## 2017-01-09 DIAGNOSIS — I252 Old myocardial infarction: Secondary | ICD-10-CM | POA: Insufficient documentation

## 2017-01-09 DIAGNOSIS — Z7982 Long term (current) use of aspirin: Secondary | ICD-10-CM | POA: Diagnosis not present

## 2017-01-09 DIAGNOSIS — G8929 Other chronic pain: Secondary | ICD-10-CM | POA: Diagnosis not present

## 2017-01-09 DIAGNOSIS — I255 Ischemic cardiomyopathy: Secondary | ICD-10-CM | POA: Insufficient documentation

## 2017-01-09 DIAGNOSIS — R110 Nausea: Secondary | ICD-10-CM | POA: Diagnosis not present

## 2017-01-09 DIAGNOSIS — Z87440 Personal history of urinary (tract) infections: Secondary | ICD-10-CM | POA: Insufficient documentation

## 2017-01-09 MED ORDER — LIDOCAINE-PRILOCAINE 2.5-2.5 % EX CREA: TOPICAL_CREAM | CUTANEOUS | 2 refills | Status: DC

## 2017-01-10 ENCOUNTER — Ambulatory Visit (HOSPITAL_COMMUNITY): Payer: Medicare Other

## 2017-01-10 ENCOUNTER — Other Ambulatory Visit (HOSPITAL_COMMUNITY): Payer: Medicare Other

## 2017-01-10 ENCOUNTER — Ambulatory Visit (HOSPITAL_COMMUNITY): Payer: Medicare Other | Admitting: Hematology and Oncology

## 2017-01-10 NOTE — Progress Notes (Signed)
Joann Hunter, [place] [address]   CLINIC:  Medical Oncology/Hematology  PCP:  Monico Blitz, [place] [address] [phone]   REASON FOR VISIT:  Follow-up for Stage IV Urothelial Carcinoma with liver mets  CURRENT THERAPY: Stopped Tecentriq. Port placement scheduled for Friday. Will start single agent Gemcitabine day 1, 8 and 15 Q 28 days next week.  BRIEF ONCOLOGIC HISTORY:    Urothelial cancer ([place])   10/22/2015 Imaging    Presented with recurrent UTI and hematuria. Underwent cystoscopy and retrograde evaluation with Dr. Exie Parody revealed multiple filling defects in distal (R) ureter. Marked irregularly of proximal (R) ureter concerning for malignancy. (+) hydronephrosis noted and tumor noted out of ureteral orifice.        11/19/2015 Imaging    MRI abdomen: 3.2 cm mass in segment 4B of left hepatic lobe, suspicious for liver metastasis. Moderate right hydronephrosis and diffuse renal parenchymal atrophy. Abnormal signal intensity throughout right renal collecting system and proximal right ureter, consistent with known urothelial carcinoma. No other sites of abdominal metastatic disease identified.      12/07/2015 Procedure    Liver biopsy: Poorly differentiated carcinoma consistent with metastatic transitional cell histology.       01/01/2016 Initial Diagnosis    Urothelial carcinoma ([place])      02/04/2016 -  Chemotherapy    atezolizumab (TECENTRIQ) 1,200 mg IV every 21 days        06/09/2016 Imaging    CT C/A/P: Interval decrease in size of hepatic metastatic lesion.  Interval decrease in distention of the right renal collecting system and right ureter with central high attenuation material compatible with known primary urothelial malignancy.  Bilateral pulmonary nodules as above. Recommend attention on follow-up.  Subacute right-sided rib fractures.      09/06/2016 Imaging    CT C/A/P: 1. Overall improvement.  The bulky tumor in the right collecting system and right proximal ureter is mildly reduced. Prior pulmonary nodules are no longer visible. Continued reduction in size of the segment 4b hepatic metastatic lesion which also demonstrates reduced conspicuity. 2. Other imaging findings of potential clinical significance: Aortic Atherosclerosis (ICD10-I70.0). Healing response from prior right posterior rib fractures. Hepatic cirrhosis. Gastric varices and splenorenal shunting indicating portal venous hypertension. Severe degenerative left hip arthropathy. Chronic compression fracture at L2. Bony demineralization.      12/23/2016 Imaging    CT C/A/P: Interval increase in size of tumoral expansion of the right renal collecting system. Additionally a tumor within the mid right ureter has increased in size when compared to prior. Slight interval increase in size of hepatic lesion, difficult to visualize without intravenous contrast material.      12/23/2016 Progression    Progression of right renal collecting system, tumor within the mid right ureter has increased in size when compared to prior and slight interval increase in size of hepatic lesion. Stop Tencentriq.         Chemotherapy    The patient had gemcitabine (GEMZAR) 1,862 mg in sodium chloride 0.9 % 100 mL chemo infusion, 1,000 mg/m2, Intravenous,  Once, 0 of 6 cycles         HISTORY OF PRESENT ILLNESS (From Dr. Donald Pore note on 01/27/2016):  Joann Hunter 84 y.o. femaleis here because of referral from Dr. Alen Blew for ureteral cancer. The patient lives closer to [facility] and felt it would be more convenient for her and her family to follow-up in [place].  Per Dr. Hazeline Junker note on 12/18/2015: "84 year old woman with history of hypertension, hyperlipidemia and coronary artery disease. She has been noted to have recurrent UTI and hematuria but subsequently underwent a cystoscopy and retrograde evaluation by Dr.  Exie Parody in October 2017. Retrograde evaluation showed multiple filling defects in the distal right ureter. There is marked irregularity of the proximal right ureter specialist for malignant process. She also had an ultrasound which showed hydronephrosis and her cystoscopy on 10/22/2015 showed a tumor noted out of the ureteral orifice. MRI of the abdomen obtained on 11/19/2015 showed a 3.2 cm mass of the left hepatic lobe suspicious for liver metastasis. Moderate right hydronephrosis was also noted. No other sites of abdominal metastasis noted. She underwent a biopsy of her liver lesion on 12/07/2015 which showed metastatic carcinoma consistent with transitional cell histology. She was referred to me for evaluation regarding these findings. Clinically, she is asymptomatic from this. She is no longer reporting any hematuria, dysuria or abdominal pain. She does not report any flank pain. Her performance status is limited related to arthritis. She ambulates short distances inside her house. She relies on her daughter for most activities of daily living."       INTERVAL HISTORY: Patient was seen last week in clinic to see cycle 13 Tecentriq. Unfortunately imaging showed progression of disease. Tecentriq was stopped. Discussed   treatment options including single agent Gemzar or possible hospice care. Patient and family decided with treatment. Patient additionally complained of urinary tract symptoms and was started on nitrofurantoin 100 mg twice daily for 10 days.   Today patient presents with dizziness and a recent fall last night. She was seen in the emergency room for a fall. Patient and daughter state she was turning to sit in her seat and became dizzy following to the ground on her right hip. She was taken via EMS to [facility] emergency room where she was evaluated for fracture. Evaluation was negative. She was sent home. Patient's family H on-call service on Sunday complaining of dizziness and she was told  to discontinue her antibiotics and to come in the next morning to be seen. She states she feels less dizzy this morning. We did not draw labs because she had them drawn last night she does not appear to be dehydrated. Although she has not had complaints in the past with antibiotic likely due to the medication. Will add to her allergy list. She denies any further urinary symptoms. Overall feels much better today. Daughter states she is eating and drinking well. We anticipate she will be okay for port placement on Friday.  REVIEW OF SYSTEMS:  Review of Systems  Constitutional: Negative for appetite change, chills, fatigue, fever and unexpected weight change.  HENT:  Negative.  Negative for mouth sores.   Eyes: Negative.   Respiratory: Negative.  Negative for cough and shortness of breath.   Cardiovascular: Positive for leg swelling.  Gastrointestinal: Negative for abdominal pain, blood in stool, constipation, diarrhea, nausea and vomiting.  Endocrine: Negative.   Genitourinary: Positive for bladder incontinence. Negative for dysuria, frequency, hematuria and vaginal bleeding.   Musculoskeletal: Negative for back pain. Arthralgias: chronic arthritis   Skin: Negative.  Negative for rash.       Bruising on her right forearm extending down to the wrist. No fracture no skin tear  Neurological: Positive for dizziness. Negative for headaches.       This is better today. Stopped nitrofurantoin.  Hematological: Does not bruise/bleed easily.  Psychiatric/Behavioral: Negative.  Negative  for sleep disturbance.  All other systems reviewed and are negative.   PAST MEDICAL/SURGICAL HISTORY:  Past Medical History:  Diagnosis Date  . Arthritis   . Back pain, chronic   . CAD (coronary artery disease)    NSTEMI 04/2011 with newly diagnosed 3V CAD s/p PCI/DES mLAD 04/11/11 with residual dz (per Dr. Burt Knack, should have consideration of PCI of the occluded RCA based on symptoms and/or consideration of outpatient  nuclear perfusion testing after the patient recovers from her infarct)  . Cancer ([place])    kidney ( Nov.2017)  . Chronic kidney disease (CKD), stage IV (severe) ([place]) 12/13/2013  . Hyperglycemia   . Hyperlipidemia   . Hypertension   . Hypothyroidism   . Ischemic cardiomyopathy    EF 45% by cath 04/20/11, 50-55% by echo 04/22/11. hypotension limiting medication titration   . Myocardial infarction ([place]) 2013  . Type 2 diabetes with nephropathy ([place]) 07/26/2014  . UTI (urinary tract infection) 12/13/2013   Past Surgical History:  Procedure Laterality Date  . BACK SURGERY    . BREAST LUMPECTOMY     right  . CORONARY STENT PLACEMENT    . HIP ARTHROPLASTY Right 04/29/2016   Procedure: ARTHROPLASTY BIPOLAR HIP (HEMIARTHROPLASTY);  Surgeon: Carole Civil, MD;  Location: [facility];  Service: Orthopedics;  Laterality: Right;  . LEFT HEART CATHETERIZATION WITH CORONARY ANGIOGRAM N/A 04/20/2011   Procedure: LEFT HEART CATHETERIZATION WITH CORONARY ANGIOGRAM;  Surgeon: Burnell Blanks, MD;  Location: [facility] CATH LAB;  Service: Cardiovascular;  Laterality: N/A;  . PERCUTANEOUS CORONARY STENT INTERVENTION (PCI-S) N/A 04/21/2011   Procedure: PERCUTANEOUS CORONARY STENT INTERVENTION (PCI-S);  Surgeon: Sherren Mocha, MD;  Location: [facility] CATH LAB;  Service: Cardiovascular;  Laterality: N/A;     SOCIAL HISTORY:  Social History   Socioeconomic History  . Marital status: Married    Spouse name: Not on file  . Number of children: Not on file  . Years of education: Not on file  . Highest education level: Not on file  Social Needs  . Financial resource strain: Not on file  . Food insecurity - worry: Not on file  . Food insecurity - inability: Not on file  . Transportation needs - medical: Not on file  . Transportation needs - non-medical: Not on file  Occupational History  . Not on file  Tobacco Use  . Smoking status: Never Smoker  . Smokeless tobacco: Never Used  Substance and Sexual Activity    . Alcohol use: No  . Drug use: No  . Sexual activity: No    Birth control/protection: Post-menopausal    Comment: married 63 years-husband at [employer]  Other Topics Concern  . Not on file  Social History Narrative   Lives in [place], [place] with her husband and daughter.    FAMILY HISTORY:  Family History  Problem Relation Age of Onset  . Other Mother   . Cancer Father   . Other Unknown        no known family cardiac disease  . Other Brother        Diptheria  . Depression Sister   . Pneumonia Sister   . Other Brother        murdered    CURRENT MEDICATIONS:  Outpatient Encounter Medications as of 01/09/2017  Medication Sig Note  . amLODipine (NORVASC) 5 MG tablet Take 1 tablet (5 mg total) by mouth daily.   Marland Kitchen aspirin EC 81 MG tablet Take 81 mg by mouth daily.   Marland Kitchen docusate  sodium (COLACE) 100 MG capsule Take 1 capsule (100 mg total) by mouth 2 (two) times daily. (Patient taking differently: Take 100 mg by mouth every other day. )   . feeding supplement, ENSURE ENLIVE, (ENSURE ENLIVE) LIQD Take 237 mLs by mouth 2 (two) times daily between meals.   . fish oil-omega-3 fatty acids 1000 MG capsule Take 1 g by mouth daily.   . Gemcitabine HCl (GEMZAR IV) Inject into the vein. Day 1,8,15 every 28 days 01/04/2017: Has not started yet.  Marland Kitchen HYDROcodone-acetaminophen (NORCO/VICODIN) 5-325 MG tablet Take 1 tablet by mouth every 6 (six) hours as needed for moderate pain.   Marland Kitchen levothyroxine (SYNTHROID, LEVOTHROID) 112 MCG tablet Take 1 tablet (112 mcg total) by mouth daily before breakfast.   . meclizine (ANTIVERT) 25 MG tablet Take 25 mg by mouth 4 (four) times daily as needed. Dizziness   . metoprolol tartrate (LOPRESSOR) 25 MG tablet Take 1 tablet (25 mg total) by mouth 2 (two) times daily. (Patient taking differently: Take 12.5 mg by mouth 2 (two) times daily. )   . nitroGLYCERIN (NITROSTAT) 0.4 MG SL tablet Place 0.4 mg under the tongue every 5 (five) minutes x 3 doses as needed. For chest pain.  04/04/2014: Has not taken  . ondansetron (ZOFRAN) 8 MG tablet Take 1 tablet (8 mg total) by mouth every 8 (eight) hours as needed for nausea or vomiting.   . pantoprazole (PROTONIX) 40 MG tablet Take 1 tablet (40 mg total) by mouth daily.   . prochlorperazine (COMPAZINE) 10 MG tablet Take 1 tablet (10 mg total) by mouth every 6 (six) hours as needed for nausea or vomiting.   . senna-docusate (SENOKOT-S) 8.6-50 MG tablet Take 1 tablet by mouth at bedtime as needed for mild constipation.   . Vitamin D, Ergocalciferol, (DRISDOL) 50000 UNITS CAPS Take 50,000 Units by mouth 2 (two) times a week. Tuesdays and Thurdays   . [DISCONTINUED] nitrofurantoin, macrocrystal-monohydrate, (MACROBID) 100 MG capsule Take 1 capsule (100 mg total) by mouth 2 (two) times daily. 01/09/2017: Dizziness  . lidocaine-prilocaine (EMLA) cream Apply a quarter size amount to affected area 1 hour prior to coming to chemotherapy.  Do not rub in.  Cover with plastic wrap.    No facility-administered encounter medications on file as of 01/09/2017.      ALLERGIES:  Allergies  Allergen Reactions  . Ciprofloxacin Other (See Comments)    Hallucination  . Lipitor [Atorvastatin] Other (See Comments)    Muscle Weakness  . Penicillins Rash    Has patient had a PCN reaction causing immediate rash, facial/tongue/throat swelling, SOB or lightheadedness with hypotension: unknown Has patient had a PCN reaction causing severe rash involving mucus membranes or skin necrosis: unknown Has patient had a PCN reaction that required hospitalization: unknown Has patient had a PCN reaction occurring within the last 10 years: unknown If all of the above answers are "NO", then may proceed with Cephalosporin use. 2     PHYSICAL EXAM:  ECOG Performance status: 2 - Symptomatic; requires assistance   Rn vitals reviewed.  Physical Exam  Constitutional: She is oriented to person, place, and time and well-developed, well-nourished, and in no distress.  Vital signs are normal. No distress.  Exam done in wheelchair.   HENT:  Head: Normocephalic and atraumatic.  Mouth/Throat: Oropharynx is clear and moist. No oropharyngeal exudate.  She is mildly hard of hearing   Eyes: Conjunctivae are normal. Pupils are equal, round, and reactive to light. No scleral icterus.  Neck: Normal range  of motion. Neck supple. No JVD present.  Cardiovascular: Normal rate, regular rhythm and normal heart sounds. Exam reveals no gallop and no friction rub.  No murmur heard. Pulmonary/Chest: Effort normal and breath sounds normal. No respiratory distress. She has no wheezes. She has no rales.  Abdominal: Soft. Bowel sounds are normal. She exhibits no distension. There is no tenderness. There is no rebound and no guarding.  Musculoskeletal: Normal range of motion. She exhibits edema. She exhibits no tenderness.       Right ankle: She exhibits swelling.       Left ankle: She exhibits swelling.  Lymphadenopathy:    She has no cervical adenopathy.  Neurological: She is alert and oriented to person, place, and time. No cranial nerve deficit.  Skin: Skin is warm and dry. Bruising noted. No rash noted. No erythema. No pallor.     Psychiatric: Mood, memory, affect and judgment normal.  Nursing note and vitals reviewed.   LABORATORY DATA:  I have reviewed the labs as listed.  CBC    Component Value Date/Time   WBC 4.1 01/08/2017 1829   RBC 3.16 (L) 01/08/2017 1829   HGB 10.1 (L) 01/08/2017 1829   HCT 31.6 (L) 01/08/2017 1829   PLT 188 01/08/2017 1829   MCV 100.0 01/08/2017 1829   MCH 32.0 01/08/2017 1829   MCHC 32.0 01/08/2017 1829   RDW 14.1 01/08/2017 1829   LYMPHSABS 1.0 01/08/2017 1829   MONOABS 0.7 01/08/2017 1829   EOSABS 0.2 01/08/2017 1829   BASOSABS 0.0 01/08/2017 1829   CMP Latest Ref Rng & Units 01/08/2017 01/04/2017 12/29/2016  Glucose 65 - 99 mg/dL 200(H) 142(H) 231(H)  BUN 6 - 20 mg/dL 17 21(H) 22(H)  Creatinine 0.44 - 1.00 mg/dL 2.01(H) 1.97(H)  1.98(H)  Sodium 135 - 145 mmol/L 136 137 136  Potassium 3.5 - 5.1 mmol/L 4.4 3.9 3.9  Chloride 101 - 111 mmol/L 102 103 103  CO2 22 - 32 mmol/L 25 23 22   Calcium 8.9 - 10.3 mg/dL 9.4 9.6 9.1  Total Protein 6.5 - 8.1 g/dL - 6.2(L) 5.8(L)  Total Bilirubin 0.3 - 1.2 mg/dL - 0.8 0.6  Alkaline Phos 38 - 126 U/L - 72 73  AST 15 - 41 U/L - 20 22  ALT 14 - 54 U/L - 10(L) 11(L)    PENDING LABS:    DIAGNOSTIC IMAGING:  MRI abdomen: 11/19/15     PATHOLOGY:  Liver biopsy: 12/07/15       ASSESSMENT & PLAN:   Stage IV Urothelial carcinoma, multifocal R ureteral masses with liver metastases:  -Port placement on Friday. -Begin single agent gemcitabine on Monday and MD assessment.  -Stopped nitrofurantoin. Added to allergy list. Patient currently not having urinary tract symptoms. Has them chronically. She received 7 days of antibiotic therapy. We will continue to monitor this. Patient is obviously sensitive to antibiotics. -Dizziness: Patient and daughter state this is better today. Continue slowly changing positions. Orthostatics were negative today. This appears to be an ongoing issue. Labs drawn from the emergency room did not indicate dehydration. Encouraged patient to push fluids.   Dispo: Return to clinic in 1 weeks for follow-up labs, MD assess and initiation of Gemcitabine.   [name], NP 01/10/2017 9:34 AM

## 2017-01-11 ENCOUNTER — Other Ambulatory Visit: Payer: Self-pay | Admitting: Radiology

## 2017-01-12 ENCOUNTER — Other Ambulatory Visit: Payer: Self-pay | Admitting: Student

## 2017-01-13 ENCOUNTER — Ambulatory Visit (HOSPITAL_COMMUNITY)
Admission: RE | Admit: 2017-01-13 | Discharge: 2017-01-13 | Disposition: A | Discharge status: Home / Self Care | Payer: Medicare Other | Source: Ambulatory Visit | Attending: Adult Health | Admitting: Adult Health

## 2017-01-13 ENCOUNTER — Encounter (HOSPITAL_COMMUNITY): Payer: Self-pay

## 2017-01-13 ENCOUNTER — Other Ambulatory Visit (HOSPITAL_COMMUNITY): Payer: Self-pay | Admitting: Adult Health

## 2017-01-13 DIAGNOSIS — Z85528 Personal history of other malignant neoplasm of kidney: Secondary | ICD-10-CM | POA: Insufficient documentation

## 2017-01-13 DIAGNOSIS — Z7982 Long term (current) use of aspirin: Secondary | ICD-10-CM | POA: Insufficient documentation

## 2017-01-13 DIAGNOSIS — I2510 Atherosclerotic heart disease of native coronary artery without angina pectoris: Secondary | ICD-10-CM | POA: Insufficient documentation

## 2017-01-13 DIAGNOSIS — I129 Hypertensive chronic kidney disease with stage 1 through stage 4 chronic kidney disease, or unspecified chronic kidney disease: Secondary | ICD-10-CM | POA: Diagnosis not present

## 2017-01-13 DIAGNOSIS — N184 Chronic kidney disease, stage 4 (severe): Secondary | ICD-10-CM | POA: Insufficient documentation

## 2017-01-13 DIAGNOSIS — Z955 Presence of coronary angioplasty implant and graft: Secondary | ICD-10-CM | POA: Insufficient documentation

## 2017-01-13 DIAGNOSIS — I252 Old myocardial infarction: Secondary | ICD-10-CM | POA: Insufficient documentation

## 2017-01-13 DIAGNOSIS — Z79899 Other long term (current) drug therapy: Secondary | ICD-10-CM | POA: Insufficient documentation

## 2017-01-13 DIAGNOSIS — E039 Hypothyroidism, unspecified: Secondary | ICD-10-CM | POA: Diagnosis not present

## 2017-01-13 DIAGNOSIS — C679 Malignant neoplasm of bladder, unspecified: Secondary | ICD-10-CM

## 2017-01-13 DIAGNOSIS — Z79890 Hormone replacement therapy (postmenopausal): Secondary | ICD-10-CM | POA: Diagnosis not present

## 2017-01-13 DIAGNOSIS — E1122 Type 2 diabetes mellitus with diabetic chronic kidney disease: Secondary | ICD-10-CM | POA: Insufficient documentation

## 2017-01-13 DIAGNOSIS — E785 Hyperlipidemia, unspecified: Secondary | ICD-10-CM | POA: Insufficient documentation

## 2017-01-13 DIAGNOSIS — C787 Secondary malignant neoplasm of liver and intrahepatic bile duct: Secondary | ICD-10-CM | POA: Diagnosis not present

## 2017-01-13 DIAGNOSIS — C669 Malignant neoplasm of unspecified ureter: Secondary | ICD-10-CM | POA: Diagnosis not present

## 2017-01-13 DIAGNOSIS — I255 Ischemic cardiomyopathy: Secondary | ICD-10-CM | POA: Insufficient documentation

## 2017-01-13 HISTORY — PX: IR FLUORO GUIDE PORT INSERTION RIGHT: IMG5741

## 2017-01-13 HISTORY — PX: IR US GUIDE VASC ACCESS RIGHT: IMG2390

## 2017-01-13 MED ORDER — LIDOCAINE-EPINEPHRINE (PF) 1 %-1:200000 IJ SOLN
INTRAMUSCULAR | Status: AC | PRN
  Administered 2017-01-13: 10 mL

## 2017-01-13 MED ORDER — LIDOCAINE HCL 1 % IJ SOLN
INTRAMUSCULAR | Status: AC
  Filled 2017-01-13: qty 20

## 2017-01-13 MED ORDER — FENTANYL CITRATE (PF) 100 MCG/2ML IJ SOLN
INTRAMUSCULAR | Status: AC
  Filled 2017-01-13: qty 2

## 2017-01-13 MED ORDER — MIDAZOLAM HCL 2 MG/2ML IJ SOLN
INTRAMUSCULAR | Status: AC
  Filled 2017-01-13: qty 2

## 2017-01-13 MED ORDER — VANCOMYCIN HCL IN DEXTROSE 1-5 GM/200ML-% IV SOLN
1000.0000 mg | INTRAVENOUS | Status: AC
  Administered 2017-01-13: 1000 mg via INTRAVENOUS

## 2017-01-13 MED ORDER — MIDAZOLAM HCL 2 MG/2ML IJ SOLN
INTRAMUSCULAR | Status: AC | PRN
  Administered 2017-01-13: 0.5 mg via INTRAVENOUS
  Administered 2017-01-13: 1 mg via INTRAVENOUS

## 2017-01-13 MED ORDER — VANCOMYCIN HCL IN DEXTROSE 1-5 GM/200ML-% IV SOLN
INTRAVENOUS | Status: AC
  Filled 2017-01-13: qty 200

## 2017-01-13 MED ORDER — SODIUM CHLORIDE 0.9 % IV SOLN: INTRAVENOUS | Status: DC

## 2017-01-13 MED ORDER — FENTANYL CITRATE (PF) 100 MCG/2ML IJ SOLN
INTRAMUSCULAR | Status: AC | PRN
  Administered 2017-01-13: 25 ug via INTRAVENOUS
  Administered 2017-01-13: 50 ug via INTRAVENOUS

## 2017-01-13 MED ORDER — HEPARIN SOD (PORK) LOCK FLUSH 100 UNIT/ML IV SOLN
INTRAVENOUS | Status: AC
  Filled 2017-01-13: qty 5

## 2017-01-13 MED ORDER — LIDOCAINE-EPINEPHRINE (PF) 1 %-1:200000 IJ SOLN
INTRAMUSCULAR | Status: AC
  Filled 2017-01-13: qty 30

## 2017-01-13 MED ORDER — SODIUM CHLORIDE 0.9 % IV SOLN
INTRAVENOUS | Status: AC | PRN
  Administered 2017-01-13: 10 mL/h via INTRAVENOUS

## 2017-01-13 MED ORDER — HEPARIN SOD (PORK) LOCK FLUSH 100 UNIT/ML IV SOLN
INTRAVENOUS | Status: AC | PRN
  Administered 2017-01-13: 500 [IU] via INTRAVENOUS

## 2017-01-13 NOTE — Sedation Documentation (Signed)
Patient is resting comfortably. 

## 2017-01-13 NOTE — Procedures (Signed)
Interventional Radiology Procedure Note  Procedure: Placement of a right IJ approach single lumen PowerPort.  Tip is positioned at the superior cavoatrial junction and catheter is ready for immediate use.  Complications: No immediate Recommendations:  - Ok to shower tomorrow - Do not submerge for 7 days - Routine line care   Signed,  Heath K. McCullough, MD   

## 2017-01-13 NOTE — H&P (Signed)
Chief Complaint: Patient was seen in consultation today for [facility] a Cath placement at the request of Hunter,Joann W  Referring Physician(s): Hunter,Joann W Dr Valentina Lucks NP  Supervising Physician: Jacqulynn Cadet  Patient Status: [facility] - Out-pt  History of Present Illness: Joann Hunter is a 84 y.o. female   Pt known Urothelial cancer Metastasis to liver Dx 2017 Most recent imaging reveals enlargement of renal and liver masses CT 12/23/16 IMPRESSION: 1. Interval increase in size of tumoral expansion of the right renal collecting system. Additionally a tumor within the mid right ureter has increased in size when compared to prior. 2. Slight interval increase in size of hepatic lesion, difficult to visualize without intravenous contrast material.  01/09/17 note [name]: INTERVAL HISTORY: Patient was seen last week in clinic to see cycle 13 Tecentriq. Unfortunately imaging showed progression of disease. Tecentriq was stopped. Discussed   treatment options including single agent Gemzar or possible hospice care. Patient and family decided with treatment. Patient additionally complained of urinary tract symptoms and was started on nitrofurantoin 100 mg twice daily for 10 days.   Now to start chemotherapy - for [name] today  Past Medical History:  Diagnosis Date  . Arthritis   . Back pain, chronic   . CAD (coronary artery disease)    NSTEMI 04/2011 with newly diagnosed 3V CAD s/p PCI/DES mLAD 04/11/11 with residual dz (per Dr. Burt Knack, should have consideration of PCI of the occluded RCA based on symptoms and/or consideration of outpatient nuclear perfusion testing after the patient recovers from her infarct)  . Cancer ([place])    kidney ( Nov.2017)  . Chronic kidney disease (CKD), stage IV (severe) ([place]) 12/13/2013  . Hyperglycemia   . Hyperlipidemia   . Hypertension   . Hypothyroidism   . Ischemic cardiomyopathy    EF 45% by cath 04/20/11,  50-55% by echo 04/22/11. hypotension limiting medication titration   . Myocardial infarction ([place]) 2013  . Type 2 diabetes with nephropathy ([place]) 07/26/2014  . UTI (urinary tract infection) 12/13/2013    Past Surgical History:  Procedure Laterality Date  . BACK SURGERY    . BREAST LUMPECTOMY     right  . CORONARY STENT PLACEMENT    . HIP ARTHROPLASTY Right 04/29/2016   Procedure: ARTHROPLASTY BIPOLAR HIP (HEMIARTHROPLASTY);  Surgeon: Carole Civil, MD;  Location: [facility];  Service: Orthopedics;  Laterality: Right;  . LEFT HEART CATHETERIZATION WITH CORONARY ANGIOGRAM N/A 04/20/2011   Procedure: LEFT HEART CATHETERIZATION WITH CORONARY ANGIOGRAM;  Surgeon: Burnell Blanks, MD;  Location[facility] CATH LAB;  Service: Cardiovascular;  Laterality: N/A;  . PERCUTANEOUS CORONARY STENT INTERVENTION (PCI-S) N/A 04/21/2011   Procedure: PERCUTANEOUS CORONARY STENT INTERVENTION (PCI-S);  Surgeon: Sherren Mocha, MD;  Location: [facility] CATH LAB;  Service: Cardiovascular;  Laterality: N/A;    Allergies: Ciprofloxacin; Nitrofurantoin; Lipitor [atorvastatin]; and Penicillins  Medications: Prior to Admission medications   Medication Sig Start Date End Date Taking? Authorizing Provider  amLODipine (NORVASC) 5 MG tablet Take 1 tablet (5 mg total) by mouth daily. 05/03/16  Yes Elgergawy, Silver Huguenin, MD  aspirin EC 81 MG tablet Take 81 mg by mouth daily.   Yes [provider]  docusate sodium (COLACE) 100 MG capsule Take 1 capsule (100 mg total) by mouth 2 (two) times daily. Patient taking differently: Take 100 mg by mouth every other day.  11/06/14  Yes Kathie Dike, MD  feeding supplement, ENSURE ENLIVE, (ENSURE ENLIVE) LIQD Take 237 mLs by mouth 2 (  two) times daily between meals. 05/02/16  Yes Elgergawy, Silver Huguenin, MD  fish oil-omega-3 fatty acids 1000 MG capsule Take 1 g by mouth daily.   Yes [provider]  levothyroxine (SYNTHROID, LEVOTHROID) 112 MCG tablet Take 1 tablet (112 mcg total)  by mouth daily before breakfast. 09/08/16  Yes Holley Bouche, NP  metoprolol tartrate (LOPRESSOR) 25 MG tablet Take 1 tablet (25 mg total) by mouth 2 (two) times daily. Patient taking differently: Take 12.5 mg by mouth 2 (two) times daily.  05/02/16  Yes Elgergawy, Silver Huguenin, MD  pantoprazole (PROTONIX) 40 MG tablet Take 1 tablet (40 mg total) by mouth daily. 11/06/14  Yes Kathie Dike, MD  Vitamin D, Ergocalciferol, (DRISDOL) 50000 UNITS CAPS Take 50,000 Units by mouth 2 (two) times a week. Tuesdays and Thurdays 05/30/12  Yes [provider]  Gemcitabine HCl (GEMZAR IV) Inject into the vein. Day 1,8,15 every 28 days    [provider]  HYDROcodone-acetaminophen (NORCO/VICODIN) 5-325 MG tablet Take 1 tablet by mouth every 6 (six) hours as needed for moderate pain. 06/03/16   Carole Civil, MD  lidocaine-prilocaine (EMLA) cream Apply a quarter size amount to affected area 1 hour prior to coming to chemotherapy.  Do not rub in.  Cover with plastic wrap. 01/09/17   Jacquelin Hawking, NP  meclizine (ANTIVERT) 25 MG tablet Take 25 mg by mouth 4 (four) times daily as needed. Dizziness    [provider]  nitroGLYCERIN (NITROSTAT) 0.4 MG SL tablet Place 0.4 mg under the tongue every 5 (five) minutes x 3 doses as needed. For chest pain. 04/23/11   Dunn, Nedra Hai, PA-C  ondansetron (ZOFRAN) 8 MG tablet Take 1 tablet (8 mg total) by mouth every 8 (eight) hours as needed for nausea or vomiting. 01/29/16   Penland, Kelby Fam, MD  prochlorperazine (COMPAZINE) 10 MG tablet Take 1 tablet (10 mg total) by mouth every 6 (six) hours as needed for nausea or vomiting. 01/29/16   Penland, Kelby Fam, MD  senna-docusate (SENOKOT-S) 8.6-50 MG tablet Take 1 tablet by mouth at bedtime as needed for mild constipation. 05/02/16   Elgergawy, Silver Huguenin, MD     Family History  Problem Relation Age of Onset  . Other Mother   . Cancer Father   . Other Unknown        no known family cardiac disease  .  Other Brother        Diptheria  . Depression Sister   . Pneumonia Sister   . Other Brother        murdered    Social History   Socioeconomic History  . Marital status: Married    Spouse name: None  . Number of children: None  . Years of education: None  . Highest education level: None  Social Needs  . Financial resource strain: None  . Food insecurity - worry: None  . Food insecurity - inability: None  . Transportation needs - medical: None  . Transportation needs - non-medical: None  Occupational History  . None  Tobacco Use  . Smoking status: Never Smoker  . Smokeless tobacco: Never Used  Substance and Sexual Activity  . Alcohol use: No  . Drug use: No  . Sexual activity: No    Birth control/protection: Post-menopausal    Comment: married 18 years-husband at [employer] [facility]  Other Topics Concern  . None  Social History Narrative   Lives in [place], [place] with her husband and daughter.  Review of Systems: A 12 point ROS discussed and pertinent positives are indicated in the HPI above.  All other systems are negative.  Review of Systems  Constitutional: Positive for activity change and fatigue. Negative for fever.  HENT: Positive for hearing loss.   Respiratory: Negative for cough and shortness of breath.   Cardiovascular: Negative for chest pain.  Gastrointestinal: Negative for abdominal pain.  Neurological: Positive for weakness.  Psychiatric/Behavioral: Negative for behavioral problems and confusion.    Vital Signs: BP (!) 173/67   Pulse 61   Temp 98.3 F (36.8 C) (Oral)   Resp 16   Ht 5\' 2"  (1.575 m)   Wt 173 lb (78.5 kg)   SpO2 99%   BMI 31.64 kg/m   Physical Exam  Constitutional: She is oriented to person, place, and time.  Cardiovascular: Normal rate, regular rhythm and normal heart sounds.  Pulmonary/Chest: Effort normal and breath sounds normal.  Abdominal: Soft. Bowel sounds are normal.  Musculoskeletal: Normal range of motion.  Neurological: She  is alert and oriented to person, place, and time.  Skin: Skin is warm and dry.  Psychiatric: She has a normal mood and affect. Her behavior is normal. Judgment and thought content normal.  Nursing note and vitals reviewed.   Imaging: Ct Abdomen Pelvis Wo Contrast  Result Date: 12/23/2016 CLINICAL DATA:  Patient with history of metastatic urothelial carcinoma. Restaging exam. EXAM: CT CHEST, ABDOMEN AND PELVIS WITHOUT CONTRAST TECHNIQUE: Multidetector CT imaging of the chest, abdomen and pelvis was performed following the standard protocol without IV contrast. COMPARISON:  CT CAP 09/06/2016. FINDINGS: CT CHEST FINDINGS Cardiovascular: Normal heart size. No pericardial effusion. Coronary artery vascular calcifications. Thoracic aortic vascular calcifications. Mediastinum/Nodes: No enlarged axillary, mediastinal or hilar lymphadenopathy. The esophagus is normal in appearance. Small hiatal hernia. Lungs/Pleura: Central airways are patent. Dependent atelectasis/ scarring within the lower lobes bilaterally. Stable 3 mm right upper lobe pulmonary nodule (image 45; series 3). No pleural effusion or pneumothorax. Musculoskeletal: Thoracic spine degenerative changes. Old right lower lateral rib fractures. CT ABDOMEN PELVIS FINDINGS Hepatobiliary: Cirrhotic morphology of the liver. Gallbladder is unremarkable. No intrahepatic or extrahepatic biliary ductal dilatation. Lack of intravenous contrast material limits evaluation for hepatic lesions. Previously described segment 4 lesion measures approximately 1.4 cm (image 35; series 5), previously 0.8 cm. Pancreas: Atrophic.  No acute changes. Spleen: Unremarkable Adrenals/Urinary Tract: Normal adrenal glands. Severely atrophic right kidney with persistent expansion of the renal collecting system secondary to tumor. This measures 4.3 x 3.5 cm on current exam, previously 3.1 x 3.3 cm (image 71; series 2). Interval increase in size of tumor within the mid right ureter  measuring 1.4 x 2.3 cm (image 79; series 2), previously 1.4 x 1.0 cm. Urinary bladder is unremarkable. Stomach/Bowel: No abnormal bowel wall thickening or evidence for bowel obstruction. Normal morphology of the stomach. Vascular/Lymphatic: Normal caliber abdominal aorta. Peripheral calcified atherosclerotic plaque. No retroperitoneal lymphadenopathy. Reproductive: Uterus and adnexal structures are unremarkable. Other: None. Musculoskeletal: Right hip arthroplasty. Left hip degenerative changes. Lumbar spine degenerative changes. No aggressive or acute appearing osseous lesions. Re- demonstrated L2 compression deformity. IMPRESSION: 1. Interval increase in size of tumoral expansion of the right renal collecting system. Additionally a tumor within the mid right ureter has increased in size when compared to prior. 2. Slight interval increase in size of hepatic lesion, difficult to visualize without intravenous contrast material. Electronically Signed   By: Lovey Newcomer M.D.   On: 12/23/2016 13:41   Ct Head Wo Contrast  Result Date: 01/04/2017 CLINICAL DATA:  C/O DIZZINESS TODAY, PATIENT IS CURRENTLY BEING TREATED FOR UTI WITH ANITBX, HX STAGE 4 UROTHELIAL CA WITH LIVER METS AND ONGOING CHEMO. HX DM, HTN, CKD EXAM: CT HEAD WITHOUT CONTRAST TECHNIQUE: Contiguous axial images were obtained from the base of the skull through the vertex without intravenous contrast. COMPARISON:  Head CT dated 04/27/2016. FINDINGS: Brain: Again noted is generalized age related parenchymal atrophy with commensurate dilatation of the ventricles and sulci. Chronic small vessel ischemic changes again noted within the bilateral periventricular and subcortical white matter. Small old lacunar infarct again noted within the right basal ganglia. There is no mass, hemorrhage, edema or other evidence of acute parenchymal abnormality. No extra-axial hemorrhage. Vascular: There are chronic calcified atherosclerotic changes of the large vessels at the  skull base. No unexpected hyperdense vessel. Skull: Normal. Negative for fracture or focal lesion. Sinuses/Orbits: No acute finding. Other: None. IMPRESSION: 1. No acute findings. No intracranial mass, hemorrhage or edema. No evidence of intracranial metastasis. 2. Atrophy and chronic ischemic changes, as detailed above. Electronically Signed   By: Franki Cabot M.D.   On: 01/04/2017 13:53   Ct Chest Wo Contrast  Result Date: 12/23/2016 CLINICAL DATA:  Patient with history of metastatic urothelial carcinoma. Restaging exam. EXAM: CT CHEST, ABDOMEN AND PELVIS WITHOUT CONTRAST TECHNIQUE: Multidetector CT imaging of the chest, abdomen and pelvis was performed following the standard protocol without IV contrast. COMPARISON:  CT CAP 09/06/2016. FINDINGS: CT CHEST FINDINGS Cardiovascular: Normal heart size. No pericardial effusion. Coronary artery vascular calcifications. Thoracic aortic vascular calcifications. Mediastinum/Nodes: No enlarged axillary, mediastinal or hilar lymphadenopathy. The esophagus is normal in appearance. Small hiatal hernia. Lungs/Pleura: Central airways are patent. Dependent atelectasis/ scarring within the lower lobes bilaterally. Stable 3 mm right upper lobe pulmonary nodule (image 45; series 3). No pleural effusion or pneumothorax. Musculoskeletal: Thoracic spine degenerative changes. Old right lower lateral rib fractures. CT ABDOMEN PELVIS FINDINGS Hepatobiliary: Cirrhotic morphology of the liver. Gallbladder is unremarkable. No intrahepatic or extrahepatic biliary ductal dilatation. Lack of intravenous contrast material limits evaluation for hepatic lesions. Previously described segment 4 lesion measures approximately 1.4 cm (image 35; series 5), previously 0.8 cm. Pancreas: Atrophic.  No acute changes. Spleen: Unremarkable Adrenals/Urinary Tract: Normal adrenal glands. Severely atrophic right kidney with persistent expansion of the renal collecting system secondary to tumor. This  measures 4.3 x 3.5 cm on current exam, previously 3.1 x 3.3 cm (image 71; series 2). Interval increase in size of tumor within the mid right ureter measuring 1.4 x 2.3 cm (image 79; series 2), previously 1.4 x 1.0 cm. Urinary bladder is unremarkable. Stomach/Bowel: No abnormal bowel wall thickening or evidence for bowel obstruction. Normal morphology of the stomach. Vascular/Lymphatic: Normal caliber abdominal aorta. Peripheral calcified atherosclerotic plaque. No retroperitoneal lymphadenopathy. Reproductive: Uterus and adnexal structures are unremarkable. Other: None. Musculoskeletal: Right hip arthroplasty. Left hip degenerative changes. Lumbar spine degenerative changes. No aggressive or acute appearing osseous lesions. Re- demonstrated L2 compression deformity. IMPRESSION: 1. Interval increase in size of tumoral expansion of the right renal collecting system. Additionally a tumor within the mid right ureter has increased in size when compared to prior. 2. Slight interval increase in size of hepatic lesion, difficult to visualize without intravenous contrast material. Electronically Signed   By: Lovey Newcomer M.D.   On: 12/23/2016 13:41   Dg Hip Unilat With Pelvis 2-3 Views Right  Result Date: 01/08/2017 CLINICAL DATA:  Fall with right hip pain. EXAM: DG HIP (WITH OR WITHOUT PELVIS) 2-3V  RIGHT COMPARISON:  Pelvis film from 04/29/2016. CT scout film from 09/06/2016. FINDINGS: Frontal pelvis shows diffuse bony demineralization. SI joints and symphysis pubis unremarkable. Degenerative changes noted in the left hip. AP and frog-leg lateral views of the right hip show the patient to be status post hemiarthroplasty. No evidence for periprosthetic fracture. Position of the femoral head component is similar to the CT scout image taking into account differential positioning. IMPRESSION: No acute bony abnormality. Electronically Signed   By: Misty Stanley M.D.   On: 01/08/2017 19:05    Labs:  CBC: Recent Labs     12/08/16 1123 12/29/16 0907 01/04/17 1142 01/08/17 1829  WBC 5.8 4.3 4.8 4.1  HGB 11.2* 10.0* 10.4* 10.1*  HCT 34.7* 31.1* 32.5* 31.6*  PLT 187 185 182 188    COAGS: Recent Labs    01/08/17 1829  INR 1.00    BMP: Recent Labs    12/08/16 1123 12/29/16 0907 01/04/17 1142 01/08/17 1829  NA 134* 136 137 136  K 3.8 3.9 3.9 4.4  CL 102 103 103 102  CO2 26 22 23 25   GLUCOSE 276* 231* 142* 200*  BUN 28* 22* 21* 17  CALCIUM 8.9 9.1 9.6 9.4  CREATININE 1.98* 1.98* 1.97* 2.01*  GFRNONAA 22* 22* 22* 22*  GFRAA 26* 26* 26* 25*    LIVER FUNCTION TESTS: Recent Labs    11/10/16 0908 12/08/16 1123 12/29/16 0907 01/04/17 1142  BILITOT 0.6 0.7 0.6 0.8  AST 20 30 22 20   ALT 16 33 11* 10*  ALKPHOS 78 92 73 72  PROT 6.4* 6.1* 5.8* 6.2*  ALBUMIN 3.1* 3.0* 2.7* 2.9*    TUMOR MARKERS: No results for input(s): AFPTM, CEA, CA199, CHROMGRNA in the last 8760 hours.  Assessment and Plan:  Urothelial cancer; mets to liver Enlarging masses on recent imaging Plan for chemotherapy to start next week Scheduled for Port a cath placement today Risks and benefits discussed with the patient including, but not limited to bleeding, infection, pneumothorax, or fibrin sheath development and need for additional procedures. All of the patient's questions were answered, patient is agreeable to proceed. Consent signed and in chart.   Thank you for this interesting consult.  I greatly enjoyed meeting JANELIZ PRESTWOOD and look forward to participating in their care.  A copy of this report was sent to the requesting provider on this date.  Electronically Signed: Lavonia Drafts, PA-C 01/13/2017, 11:21 AM   I spent a total of  30 Minutes   in face to face in clinical consultation, greater than 50% of which was counseling/coordinating care for [facility] placement

## 2017-01-13 NOTE — Discharge Instructions (Signed)
Implanted Port Insertion, Care After °This sheet gives you information about how to care for yourself after your procedure. Your health care provider may also give you more specific instructions. If you have problems or questions, contact your health care provider. °What can I expect after the procedure? °After your procedure, it is common to have: °· Discomfort at the port insertion site. °· Bruising on the skin over the port. This should improve over 3-4 days. ° °Follow these instructions at home: °Port care °· After your port is placed, you will get a manufacturer's information card. The card has information about your port. Keep this card with you at all times. °· Take care of the port as told by your health care provider. Ask your health care provider if you or a family member can get training for taking care of the port at home. A home health care nurse may also take care of the port. °· Make sure to remember what type of port you have. °Incision care °· Follow instructions from your health care provider about how to take care of your port insertion site. Make sure you: °? Wash your hands with soap and water before you change your bandage (dressing). If soap and water are not available, use hand sanitizer. °? Change your dressing as told by your health care provider. °? Leave stitches (sutures), skin glue, or adhesive strips in place. These skin closures may need to stay in place for 2 weeks or longer. If adhesive strip edges start to loosen and curl up, you may trim the loose edges. Do not remove adhesive strips completely unless your health care provider tells you to do that. °· Check your port insertion site every day for signs of infection. Check for: °? More redness, swelling, or pain. °? More fluid or blood. °? Warmth. °? Pus or a bad smell. °General instructions °· Do not take baths, swim, or use a hot tub until your health care provider approves. °· Do not lift anything that is heavier than 10 lb (4.5  kg) for a week, or as told by your health care provider. °· Ask your health care provider when it is okay to: °? Return to work or school. °? Resume usual physical activities or sports. °· Do not drive for 24 hours if you were given a medicine to help you relax (sedative). °· Take over-the-counter and prescription medicines only as told by your health care provider. °· Wear a medical alert bracelet in case of an emergency. This will tell any health care providers that you have a port. °· Keep all follow-up visits as told by your health care provider. This is important. °Contact a health care provider if: °· You cannot flush your port with saline as directed, or you cannot draw blood from the port. °· You have a fever or chills. °· You have more redness, swelling, or pain around your port insertion site. °· You have more fluid or blood coming from your port insertion site. °· Your port insertion site feels warm to the touch. °· You have pus or a bad smell coming from the port insertion site. °Get help right away if: °· You have chest pain or shortness of breath. °· You have bleeding from your port that you cannot control. °Summary °· Take care of the port as told by your health care provider. °· Change your dressing as told by your health care provider. °· Keep all follow-up visits as told by your health care provider. °  This information is not intended to replace advice given to you by your health care provider. Make sure you discuss any questions you have with your health care provider. °Document Released: 10/10/2012 Document Revised: 11/11/2015 Document Reviewed: 11/11/2015 °Elsevier Interactive Patient Education © 2017 [employer] ° °

## 2017-01-15 ENCOUNTER — Other Ambulatory Visit: Payer: Self-pay

## 2017-01-15 ENCOUNTER — Encounter (HOSPITAL_COMMUNITY): Payer: Self-pay | Admitting: Emergency Medicine

## 2017-01-15 ENCOUNTER — Emergency Department (HOSPITAL_COMMUNITY): Payer: Medicare Other

## 2017-01-15 ENCOUNTER — Emergency Department (HOSPITAL_COMMUNITY)
Admission: EM | Admit: 2017-01-15 | Discharge: 2017-01-15 | Disposition: A | Discharge status: Home / Self Care | Payer: Medicare Other | Attending: Emergency Medicine | Admitting: Emergency Medicine

## 2017-01-15 DIAGNOSIS — N184 Chronic kidney disease, stage 4 (severe): Secondary | ICD-10-CM | POA: Insufficient documentation

## 2017-01-15 DIAGNOSIS — Z79899 Other long term (current) drug therapy: Secondary | ICD-10-CM | POA: Diagnosis not present

## 2017-01-15 DIAGNOSIS — Z87440 Personal history of urinary (tract) infections: Secondary | ICD-10-CM | POA: Insufficient documentation

## 2017-01-15 DIAGNOSIS — I259 Chronic ischemic heart disease, unspecified: Secondary | ICD-10-CM | POA: Insufficient documentation

## 2017-01-15 DIAGNOSIS — E039 Hypothyroidism, unspecified: Secondary | ICD-10-CM | POA: Diagnosis not present

## 2017-01-15 DIAGNOSIS — I129 Hypertensive chronic kidney disease with stage 1 through stage 4 chronic kidney disease, or unspecified chronic kidney disease: Secondary | ICD-10-CM | POA: Diagnosis not present

## 2017-01-15 DIAGNOSIS — E860 Dehydration: Secondary | ICD-10-CM | POA: Insufficient documentation

## 2017-01-15 DIAGNOSIS — Z7982 Long term (current) use of aspirin: Secondary | ICD-10-CM | POA: Insufficient documentation

## 2017-01-15 DIAGNOSIS — E1121 Type 2 diabetes mellitus with diabetic nephropathy: Secondary | ICD-10-CM | POA: Diagnosis not present

## 2017-01-15 DIAGNOSIS — R42 Dizziness and giddiness: Secondary | ICD-10-CM | POA: Diagnosis present

## 2017-01-15 LAB — COMPREHENSIVE METABOLIC PANEL
ALT: 8 U/L — AB (ref 14–54)
ANION GAP: 10 (ref 5–15)
AST: 21 U/L (ref 15–41)
Albumin: 2.8 g/dL — ABNORMAL LOW (ref 3.5–5.0)
Alkaline Phosphatase: 68 U/L (ref 38–126)
BUN: 19 mg/dL (ref 6–20)
CHLORIDE: 99 mmol/L — AB (ref 101–111)
CO2: 25 mmol/L (ref 22–32)
CREATININE: 1.68 mg/dL — AB (ref 0.44–1.00)
Calcium: 9.2 mg/dL (ref 8.9–10.3)
GFR calc non Af Amer: 27 mL/min — ABNORMAL LOW (ref >60)
GFR, EST AFRICAN AMERICAN: 32 mL/min — AB (ref >60)
Glucose, Bld: 243 mg/dL — ABNORMAL HIGH (ref 65–99)
POTASSIUM: 4.1 mmol/L (ref 3.5–5.1)
SODIUM: 134 mmol/L — AB (ref 135–145)
Total Bilirubin: 0.6 mg/dL (ref 0.3–1.2)
Total Protein: 5.8 g/dL — ABNORMAL LOW (ref 6.5–8.1)

## 2017-01-15 LAB — CBC WITH DIFFERENTIAL/PLATELET
BASOS ABS: 0 10*3/uL (ref 0.0–0.1)
Basophils Relative: 0 %
EOS ABS: 0.2 10*3/uL (ref 0.0–0.7)
EOS PCT: 4 %
HCT: 31.1 % — ABNORMAL LOW (ref 36.0–46.0)
Hemoglobin: 9.9 g/dL — ABNORMAL LOW (ref 12.0–15.0)
LYMPHS ABS: 0.9 10*3/uL (ref 0.7–4.0)
Lymphocytes Relative: 18 %
MCH: 31.6 pg (ref 26.0–34.0)
MCHC: 31.8 g/dL (ref 30.0–36.0)
MCV: 99.4 fL (ref 78.0–100.0)
MONO ABS: 0.5 10*3/uL (ref 0.1–1.0)
Monocytes Relative: 9 %
Neutro Abs: 3.5 10*3/uL (ref 1.7–7.7)
Neutrophils Relative %: 69 %
PLATELETS: 187 10*3/uL (ref 150–400)
RBC: 3.13 MIL/uL — AB (ref 3.87–5.11)
RDW: 13.4 % (ref 11.5–15.5)
WBC: 5 10*3/uL (ref 4.0–10.5)

## 2017-01-15 LAB — URINALYSIS, ROUTINE W REFLEX MICROSCOPIC
BILIRUBIN URINE: NEGATIVE
Glucose, UA: NEGATIVE mg/dL
HGB URINE DIPSTICK: NEGATIVE
KETONES UR: NEGATIVE mg/dL
NITRITE: NEGATIVE
PROTEIN: NEGATIVE mg/dL
RBC / HPF: NONE SEEN RBC/hpf (ref 0–5)
Specific Gravity, Urine: 1.006 (ref 1.005–1.030)
pH: 5 (ref 5.0–8.0)

## 2017-01-15 MED ORDER — SODIUM CHLORIDE 0.9 % IV BOLUS (SEPSIS)
500.0000 mL | Freq: Once | INTRAVENOUS | Status: AC
  Administered 2017-01-15: 500 mL via INTRAVENOUS

## 2017-01-15 NOTE — Discharge Instructions (Signed)
Drink plenty of fluids and follow-up with your doctor if needed

## 2017-01-15 NOTE — ED Provider Notes (Signed)
[facility] EMERGENCY DEPARTMENT Provider Note   CSN: [id number] Arrival date & time: 01/15/17  1127     History   Chief Complaint Chief Complaint  Patient presents with  . Dizziness    HPI Joann Hunter is a 84 y.o. female.  Patient complains of some dizziness.  Patient has a history of vertigo she is also had urinary tract infections and dehydration that has made her dizziness worse   The history is provided by the patient.  Dizziness  Quality:  Lightheadedness Severity:  Mild Onset quality:  Sudden Timing:  Constant Progression:  Waxing and waning Chronicity:  Recurrent Context: bending over   Relieved by:  Nothing Associated symptoms: no chest pain, no diarrhea and no headaches     Past Medical History:  Diagnosis Date  . Arthritis   . Back pain, chronic   . CAD (coronary artery disease)    NSTEMI 04/2011 with newly diagnosed 3V CAD s/p PCI/DES mLAD 04/11/11 with residual dz (per Dr. Burt Knack, should have consideration of PCI of the occluded RCA based on symptoms and/or consideration of outpatient nuclear perfusion testing after the patient recovers from her infarct)  . Cancer ([place])    kidney ( Nov.2017)  . Chronic kidney disease (CKD), stage IV (severe) ([place]) 12/13/2013  . Hyperglycemia   . Hyperlipidemia   . Hypertension   . Hypothyroidism   . Ischemic cardiomyopathy    EF 45% by cath 04/20/11, 50-55% by echo 04/22/11. hypotension limiting medication titration   . Myocardial infarction ([place]) 2013  . Type 2 diabetes with nephropathy ([place]) 07/26/2014  . UTI (urinary tract infection) 12/13/2013    Patient Active Problem List   Diagnosis Date Noted  . Hypokalemia 06/16/2016  . Hip fracture, unspecified laterality, closed, initial encounter ([place]) 04/28/2016  . Ischemic cardiomyopathy 04/28/2016  . Hyperlipidemia 04/28/2016  . Cancer ([place]) 04/28/2016  . Hip fracture ([place]) 04/28/2016  . Dehydration 04/14/2016  . Orthostasis 02/24/2016  . Acute encephalopathy  02/24/2016  . Confusion 02/21/2016  . Neutropenia ([place]) 02/20/2016  . Thrombocytopenia ([place]) 02/20/2016  . ESBL (extended spectrum beta-lactamase) producing bacteria infection 02/20/2016  . Goals of care, counseling/discussion 02/02/2016  . Urothelial cancer ([place]) 01/01/2016  . Rectal bleeding 11/05/2014  . Absolute anemia 11/05/2014  . Hypothyroidism 11/05/2014  . Metabolic encephalopathy 50/53/9767  . Type 2 diabetes with nephropathy ([place]) 07/26/2014  . Essential hypertension 07/26/2014  . Hyponatremia 07/25/2014  . Vertigo 07/25/2014  . AKI (acute kidney injury) ([place]) 12/13/2013  . Fall 12/13/2013  . Lower urinary tract infectious disease 12/13/2013  . Chronic kidney disease (CKD), stage IV (severe) ([place]) 12/13/2013  . Chest pain at rest 04/26/2011  . Syncope, near 04/26/2011  . CAD (coronary artery disease) 04/23/2011  . Cardiomyopathy, ischemic 04/23/2011  . Dyslipidemia 04/23/2011  . Myocardial infarction, anterior wall, initial care ([place]) 04/20/2011    Past Surgical History:  Procedure Laterality Date  . BACK SURGERY    . BREAST LUMPECTOMY     right  . CORONARY STENT PLACEMENT    . HIP ARTHROPLASTY Right 04/29/2016   Procedure: ARTHROPLASTY BIPOLAR HIP (HEMIARTHROPLASTY);  Surgeon: Carole Civil, MD;  Location: [facility];  Service: Orthopedics;  Laterality: Right;  . IR FLUORO GUIDE PORT INSERTION RIGHT  01/13/2017  . IR US GUIDE VASC ACCESS RIGHT  01/13/2017  . LEFT HEART CATHETERIZATION WITH CORONARY ANGIOGRAM N/A 04/20/2011   Procedure: LEFT HEART CATHETERIZATION WITH CORONARY ANGIOGRAM;  Surgeon: Burnell Blanks, MD;  Location: [facility] CATH LAB;  Service:  Cardiovascular;  Laterality: N/A;  . PERCUTANEOUS CORONARY STENT INTERVENTION (PCI-S) N/A 04/21/2011   Procedure: PERCUTANEOUS CORONARY STENT INTERVENTION (PCI-S);  Surgeon: Sherren Mocha, MD;  Location: [facility] CATH LAB;  Service: Cardiovascular;  Laterality: N/A;    OB History    Gravida Para Term Preterm AB  Living   2 2 2     2    SAB TAB Ectopic Multiple Live Births                   Home Medications    Prior to Admission medications   Medication Sig Start Date End Date Taking? Authorizing Provider  amLODipine (NORVASC) 5 MG tablet Take 1 tablet (5 mg total) by mouth daily. 05/03/16  Yes Elgergawy, Silver Huguenin, MD  aspirin EC 81 MG tablet Take 81 mg by mouth daily.   Yes [provider]  docusate sodium (COLACE) 100 MG capsule Take 1 capsule (100 mg total) by mouth 2 (two) times daily. Patient taking differently: Take 100 mg by mouth every other day.  11/06/14  Yes Kathie Dike, MD  feeding supplement, ENSURE ENLIVE, (ENSURE ENLIVE) LIQD Take 237 mLs by mouth 2 (two) times daily between meals. 05/02/16  Yes Elgergawy, Silver Huguenin, MD  fish oil-omega-3 fatty acids 1000 MG capsule Take 1 g by mouth daily.   Yes [provider]  HYDROcodone-acetaminophen (NORCO/VICODIN) 5-325 MG tablet Take 1 tablet by mouth every 6 (six) hours as needed for moderate pain. 06/03/16  Yes Carole Civil, MD  levothyroxine (SYNTHROID, LEVOTHROID) 112 MCG tablet Take 1 tablet (112 mcg total) by mouth daily before breakfast. 09/08/16  Yes Holley Bouche, NP  lidocaine-prilocaine (EMLA) cream Apply a quarter size amount to affected area 1 hour prior to coming to chemotherapy.  Do not rub in.  Cover with plastic wrap. 01/09/17  Yes Jacquelin Hawking, NP  meclizine (ANTIVERT) 25 MG tablet Take 25 mg by mouth 4 (four) times daily as needed. Dizziness   Yes [provider]  metoprolol tartrate (LOPRESSOR) 25 MG tablet Take 1 tablet (25 mg total) by mouth 2 (two) times daily. Patient taking differently: Take 12.5 mg by mouth 2 (two) times daily.  05/02/16  Yes Elgergawy, Silver Huguenin, MD  nitroGLYCERIN (NITROSTAT) 0.4 MG SL tablet Place 0.4 mg under the tongue every 5 (five) minutes x 3 doses as needed. For chest pain. 04/23/11  Yes Dunn, Dayna N, PA-C  pantoprazole (PROTONIX) 40 MG tablet Take 1 tablet (40  mg total) by mouth daily. 11/06/14  Yes Kathie Dike, MD  prochlorperazine (COMPAZINE) 10 MG tablet Take 1 tablet (10 mg total) by mouth every 6 (six) hours as needed for nausea or vomiting. 01/29/16  Yes Penland, Kelby Fam, MD  promethazine (PHENERGAN) 25 MG tablet Take 25 mg by mouth every 6 (six) hours as needed for nausea or vomiting.   Yes [provider]  Gemcitabine HCl (GEMZAR IV) Inject into the vein. Day 1,8,15 every 28 days    [provider]  Vitamin D, Ergocalciferol, (DRISDOL) 50000 UNITS CAPS Take 50,000 Units by mouth 2 (two) times a week. Tuesdays and Thurdays 05/30/12   [provider]    Family History Family History  Problem Relation Age of Onset  . Other Mother   . Cancer Father   . Other Unknown        no known family cardiac disease  . Other Brother        Diptheria  . Depression Sister   . Pneumonia  Sister   . Other Brother        murdered    Social History Social History   Tobacco Use  . Smoking status: Never Smoker  . Smokeless tobacco: Never Used  Substance Use Topics  . Alcohol use: No  . Drug use: No     Allergies   Ciprofloxacin; Nitrofurantoin; Lipitor [atorvastatin]; and Penicillins   Review of Systems Review of Systems  Constitutional: Negative for appetite change and fatigue.  HENT: Negative for congestion, ear discharge and sinus pressure.   Eyes: Negative for discharge.  Respiratory: Negative for cough.   Cardiovascular: Negative for chest pain.  Gastrointestinal: Negative for abdominal pain and diarrhea.  Genitourinary: Negative for frequency and hematuria.  Musculoskeletal: Negative for back pain.  Skin: Negative for rash.  Neurological: Positive for dizziness. Negative for seizures and headaches.  Psychiatric/Behavioral: Negative for hallucinations.     Physical Exam Updated Vital Signs Ht 5\' 2"  (1.575 m)   Wt 78.5 kg (173 lb)   BMI 31.64 kg/m   Physical Exam  Constitutional: She is  oriented to person, place, and time. She appears well-developed.  HENT:  Head: Normocephalic.  Eyes: Conjunctivae and EOM are normal. No scleral icterus.  Neck: Neck supple. No thyromegaly present.  Cardiovascular: Normal rate and regular rhythm. Exam reveals no gallop and no friction rub.  No murmur heard. Pulmonary/Chest: No stridor. She has no wheezes. She has no rales. She exhibits no tenderness.  Abdominal: She exhibits no distension. There is no tenderness. There is no rebound.  Musculoskeletal: Normal range of motion. She exhibits no edema.  Lymphadenopathy:    She has no cervical adenopathy.  Neurological: She is oriented to person, place, and time. She exhibits normal muscle tone. Coordination normal.  Skin: No rash noted. No erythema.  Psychiatric: She has a normal mood and affect. Her behavior is normal.     ED Treatments / Results  Labs (all labs ordered are listed, but only abnormal results are displayed) Labs Reviewed  CBC WITH DIFFERENTIAL/PLATELET - Abnormal; Notable for the following components:      Result Value   RBC 3.13 (*)    Hemoglobin 9.9 (*)    HCT 31.1 (*)    All other components within normal limits  COMPREHENSIVE METABOLIC PANEL - Abnormal; Notable for the following components:   Sodium 134 (*)    Chloride 99 (*)    Glucose, Bld 243 (*)    Creatinine, Ser 1.68 (*)    Total Protein 5.8 (*)    Albumin 2.8 (*)    ALT 8 (*)    GFR calc non Af Amer 27 (*)    GFR calc Af Amer 32 (*)    All other components within normal limits  URINALYSIS, ROUTINE W REFLEX MICROSCOPIC - Abnormal; Notable for the following components:   Leukocytes, UA TRACE (*)    Bacteria, UA RARE (*)    Squamous Epithelial / LPF 0-5 (*)    All other components within normal limits  URINE CULTURE    EKG  EKG Interpretation None       Radiology Ct Head Wo Contrast  Result Date: 01/15/2017 CLINICAL DATA:  Nausea and dizziness for the past month. The dizziness is increasing.  EXAM: CT HEAD WITHOUT CONTRAST TECHNIQUE: Contiguous axial images were obtained from the base of the skull through the vertex without intravenous contrast. COMPARISON:  01/04/2017. FINDINGS: Brain: Diffusely enlarged ventricles and subarachnoid spaces. Patchy white matter low density in both cerebral hemispheres. No intracranial  hemorrhage, mass lesion or CT evidence of acute infarction. Vascular: No hyperdense vessel or unexpected calcification. Skull: Mild bilateral hyperostosis frontalis. Sinuses/Orbits: Unremarkable. Other: None. IMPRESSION: 1. No acute abnormality. 2. Stable atrophy and chronic small vessel white matter ischemic changes. Electronically Signed   By: Claudie Revering M.D.   On: 01/15/2017 13:57    Procedures Procedures (including critical care time)  Medications Ordered in ED Medications  sodium chloride 0.9 % bolus 500 mL (0 mLs Intravenous Stopped 01/15/17 1431)     Initial Impression / Assessment and Plan / ED Course  I have reviewed the triage vital signs and the nursing notes.  Pertinent labs & imaging results that were available during my care of the patient were reviewed by me and considered in my medical decision making (see chart for details).    Labs including urinalysis and CT of head unremarkable.  Patient improved with IV fluids and is not dizzy now she will follow-up with her PCP  Final Clinical Impressions(s) / ED Diagnoses   Final diagnoses:  Dizziness  Dehydration    ED Discharge Orders    None       Milton Ferguson, MD 01/15/17 1534

## 2017-01-15 NOTE — ED Triage Notes (Addendum)
Patient c/o dizziness and nausea x1 month. Patient placed on antibiotics 3 weeks ago for antibiotics but dizziness increased and she started having diarrhea. Patient taken off antibiotics and not given any new medication. Per family, unsure if UTI has resolved and patient gets easily dehydrated. Family states patient has voided x5 since last night. Patient's dizziness increasing-per patient feels like room is spinning, worse with movement. Patient took phenergan at 9:30am for nausea, denies any nausea at this time. Patient had port-a-cath placed on Friday for chemo treatments to start.

## 2017-01-16 ENCOUNTER — Other Ambulatory Visit: Payer: Self-pay

## 2017-01-16 ENCOUNTER — Inpatient Hospital Stay (HOSPITAL_BASED_OUTPATIENT_CLINIC_OR_DEPARTMENT_OTHER): Payer: Medicare Other | Admitting: Hematology and Oncology

## 2017-01-16 ENCOUNTER — Inpatient Hospital Stay (HOSPITAL_COMMUNITY): Payer: Medicare Other

## 2017-01-16 ENCOUNTER — Encounter (HOSPITAL_COMMUNITY): Payer: Self-pay | Admitting: Hematology and Oncology

## 2017-01-16 VITALS — BP 125/55 | HR 66 | Temp 98.7°F | Resp 18 | Wt 181.2 lb

## 2017-01-16 DIAGNOSIS — R42 Dizziness and giddiness: Secondary | ICD-10-CM

## 2017-01-16 DIAGNOSIS — Z5111 Encounter for antineoplastic chemotherapy: Secondary | ICD-10-CM | POA: Diagnosis not present

## 2017-01-16 DIAGNOSIS — I255 Ischemic cardiomyopathy: Secondary | ICD-10-CM | POA: Diagnosis not present

## 2017-01-16 DIAGNOSIS — C689 Malignant neoplasm of urinary organ, unspecified: Secondary | ICD-10-CM

## 2017-01-16 DIAGNOSIS — Z880 Allergy status to penicillin: Secondary | ICD-10-CM | POA: Diagnosis not present

## 2017-01-16 DIAGNOSIS — R32 Unspecified urinary incontinence: Secondary | ICD-10-CM

## 2017-01-16 DIAGNOSIS — Z9225 Personal history of immunosupression therapy: Secondary | ICD-10-CM

## 2017-01-16 DIAGNOSIS — Z7982 Long term (current) use of aspirin: Secondary | ICD-10-CM | POA: Diagnosis not present

## 2017-01-16 DIAGNOSIS — C787 Secondary malignant neoplasm of liver and intrahepatic bile duct: Secondary | ICD-10-CM | POA: Diagnosis not present

## 2017-01-16 DIAGNOSIS — Z79899 Other long term (current) drug therapy: Secondary | ICD-10-CM

## 2017-01-16 DIAGNOSIS — Z794 Long term (current) use of insulin: Secondary | ICD-10-CM

## 2017-01-16 DIAGNOSIS — M7989 Other specified soft tissue disorders: Secondary | ICD-10-CM

## 2017-01-16 DIAGNOSIS — I129 Hypertensive chronic kidney disease with stage 1 through stage 4 chronic kidney disease, or unspecified chronic kidney disease: Secondary | ICD-10-CM | POA: Diagnosis not present

## 2017-01-16 DIAGNOSIS — I252 Old myocardial infarction: Secondary | ICD-10-CM

## 2017-01-16 DIAGNOSIS — E039 Hypothyroidism, unspecified: Secondary | ICD-10-CM | POA: Diagnosis not present

## 2017-01-16 DIAGNOSIS — G8929 Other chronic pain: Secondary | ICD-10-CM | POA: Diagnosis not present

## 2017-01-16 DIAGNOSIS — I2510 Atherosclerotic heart disease of native coronary artery without angina pectoris: Secondary | ICD-10-CM | POA: Diagnosis not present

## 2017-01-16 DIAGNOSIS — M549 Dorsalgia, unspecified: Secondary | ICD-10-CM | POA: Diagnosis not present

## 2017-01-16 DIAGNOSIS — N184 Chronic kidney disease, stage 4 (severe): Secondary | ICD-10-CM

## 2017-01-16 DIAGNOSIS — R918 Other nonspecific abnormal finding of lung field: Secondary | ICD-10-CM

## 2017-01-16 DIAGNOSIS — Z9181 History of falling: Secondary | ICD-10-CM | POA: Diagnosis not present

## 2017-01-16 DIAGNOSIS — R110 Nausea: Secondary | ICD-10-CM | POA: Diagnosis not present

## 2017-01-16 DIAGNOSIS — Z881 Allergy status to other antibiotic agents status: Secondary | ICD-10-CM

## 2017-01-16 DIAGNOSIS — E785 Hyperlipidemia, unspecified: Secondary | ICD-10-CM | POA: Diagnosis not present

## 2017-01-16 DIAGNOSIS — M1990 Unspecified osteoarthritis, unspecified site: Secondary | ICD-10-CM

## 2017-01-16 DIAGNOSIS — Z9221 Personal history of antineoplastic chemotherapy: Secondary | ICD-10-CM

## 2017-01-16 DIAGNOSIS — E1122 Type 2 diabetes mellitus with diabetic chronic kidney disease: Secondary | ICD-10-CM

## 2017-01-16 DIAGNOSIS — Z87440 Personal history of urinary (tract) infections: Secondary | ICD-10-CM

## 2017-01-16 MED ORDER — PROCHLORPERAZINE MALEATE 10 MG PO TABS
ORAL_TABLET | ORAL | Status: AC
  Filled 2017-01-16: qty 1

## 2017-01-16 MED ORDER — HEPARIN SOD (PORK) LOCK FLUSH 100 UNIT/ML IV SOLN
500.0000 [IU] | Freq: Once | INTRAVENOUS | Status: AC | PRN
  Administered 2017-01-16: 500 [IU]

## 2017-01-16 MED ORDER — SODIUM CHLORIDE 0.9% FLUSH
10.0000 mL | INTRAVENOUS | Status: DC | PRN
  Administered 2017-01-16: 10 mL
  Filled 2017-01-16: qty 10

## 2017-01-16 MED ORDER — SODIUM CHLORIDE 0.9 % IV SOLN
Freq: Once | INTRAVENOUS | Status: AC
  Administered 2017-01-16: 12:00:00 via INTRAVENOUS

## 2017-01-16 MED ORDER — PROCHLORPERAZINE MALEATE 10 MG PO TABS
10.0000 mg | ORAL_TABLET | Freq: Once | ORAL | Status: AC
  Administered 2017-01-16: 10 mg via ORAL

## 2017-01-16 MED ORDER — HEPARIN SOD (PORK) LOCK FLUSH 100 UNIT/ML IV SOLN
INTRAVENOUS | Status: AC
  Filled 2017-01-16: qty 5

## 2017-01-16 MED ORDER — SODIUM CHLORIDE 0.9 % IV SOLN
1000.0000 mg/m2 | Freq: Once | INTRAVENOUS | Status: AC
  Administered 2017-01-16: 1862 mg via INTRAVENOUS
  Filled 2017-01-16: qty 48.97

## 2017-01-16 MED ORDER — PROCHLORPERAZINE MALEATE 10 MG PO TABS
ORAL_TABLET | ORAL | Status: AC
Start: 2017-01-16 — End: ?
  Filled 2017-01-16: qty 1

## 2017-01-16 NOTE — Patient Instructions (Signed)
[facility] Discharge Instructions for Patients Receiving Chemotherapy   Beginning January 23rd 2017 lab work for the Cancer Center will be done in the  Main lab at [facility] on 1st floor. If you have a lab appointment with the Cancer Center please come in thru the  Main Entrance and check in at the main information desk   Today you received the following chemotherapy agents   To help prevent nausea and vomiting after your treatment, we encourage you to take your nausea medication     If you develop nausea and vomiting, or diarrhea that is not controlled by your medication, call the clinic.  The clinic phone number is [phone]. Office hours are Monday-Friday 8:30am-5:00pm.  BELOW ARE SYMPTOMS THAT SHOULD BE REPORTED IMMEDIATELY:  *FEVER GREATER THAN 101.0 F  *CHILLS WITH OR WITHOUT FEVER  NAUSEA AND VOMITING THAT IS NOT CONTROLLED WITH YOUR NAUSEA MEDICATION  *UNUSUAL SHORTNESS OF BREATH  *UNUSUAL BRUISING OR BLEEDING  TENDERNESS IN MOUTH AND THROAT WITH OR WITHOUT PRESENCE OF ULCERS  *URINARY PROBLEMS  *BOWEL PROBLEMS  UNUSUAL RASH Items with * indicate a potential emergency and should be followed up as soon as possible. If you have an emergency after office hours please contact your primary care physician or go to the nearest emergency department.  Please call the clinic during office hours if you have any questions or concerns.   You may also contact the Patient Navigator at [phone] should you have any questions or need assistance in obtaining follow up care.      Resources For Cancer Patients and their Caregivers ? American Cancer Society: Can assist with transportation, wigs, general needs, runs Look Good Feel Better.        1-888-227-6333 ? Cancer Care: Provides financial assistance, online support groups, medication/co-pay assistance.  1-800-813-HOPE (4673) ? [facility] Assists [place] cancer  patients and their families through emotional , educational and financial support.  336-427-4357 ? [place] DSS Where to apply for food stamps, Medicaid and utility assistance. 336-342-1394 ? [facility]: Transportation to medical appointments. 336-347-2287 ? Social Security Administration: May apply for disability if have a Stage IV cancer. 336-342-7796 1-800-772-1213 ? [place] Aging, Disability and Transit Services: Assists with nutrition, care and transit needs. 336-349-2343         

## 2017-01-16 NOTE — Progress Notes (Signed)
Examined by MD today. Labs reviewed and chemotherapy treatment given as ordered.   Treatment given per orders. Patient tolerated it well without problems. Vitals stable and discharged home from clinic ambulatory. Follow up as scheduled.

## 2017-01-16 NOTE — Progress Notes (Signed)
Chemotherapy teaching completed.  Consent signed.  Extensive teaching packet given.   

## 2017-01-17 ENCOUNTER — Ambulatory Visit (HOSPITAL_COMMUNITY): Payer: Medicare Other

## 2017-01-17 ENCOUNTER — Telehealth (HOSPITAL_COMMUNITY): Payer: Self-pay

## 2017-01-17 ENCOUNTER — Other Ambulatory Visit (HOSPITAL_COMMUNITY): Payer: Medicare Other

## 2017-01-17 NOTE — Telephone Encounter (Signed)
24 hour follow up. Spoke with Joann Hunter her caretaker, no issues to report. She is doing ok today.

## 2017-01-18 LAB — URINE CULTURE: Culture: >=100000 — AB

## 2017-01-19 ENCOUNTER — Telehealth (HOSPITAL_COMMUNITY): Payer: Self-pay | Admitting: Internal Medicine

## 2017-01-19 ENCOUNTER — Other Ambulatory Visit: Payer: Self-pay

## 2017-01-19 ENCOUNTER — Telehealth: Payer: Self-pay | Admitting: *Deleted

## 2017-01-19 ENCOUNTER — Emergency Department (HOSPITAL_COMMUNITY): Payer: Medicare Other

## 2017-01-19 ENCOUNTER — Encounter (HOSPITAL_COMMUNITY): Payer: Self-pay | Admitting: Emergency Medicine

## 2017-01-19 ENCOUNTER — Ambulatory Visit (HOSPITAL_COMMUNITY): Payer: Medicare Other

## 2017-01-19 ENCOUNTER — Emergency Department (HOSPITAL_COMMUNITY)
Admission: EM | Admit: 2017-01-19 | Discharge: 2017-01-19 | Disposition: A | Discharge status: Home Health | Payer: Medicare Other | Source: Home / Self Care | Attending: Emergency Medicine | Admitting: Emergency Medicine

## 2017-01-19 ENCOUNTER — Telehealth (HOSPITAL_COMMUNITY): Payer: Self-pay

## 2017-01-19 ENCOUNTER — Encounter (HOSPITAL_COMMUNITY): Payer: Self-pay | Admitting: Internal Medicine

## 2017-01-19 ENCOUNTER — Other Ambulatory Visit (HOSPITAL_COMMUNITY): Payer: Medicare Other

## 2017-01-19 DIAGNOSIS — E1121 Type 2 diabetes mellitus with diabetic nephropathy: Secondary | ICD-10-CM

## 2017-01-19 DIAGNOSIS — E039 Hypothyroidism, unspecified: Secondary | ICD-10-CM | POA: Insufficient documentation

## 2017-01-19 DIAGNOSIS — I129 Hypertensive chronic kidney disease with stage 1 through stage 4 chronic kidney disease, or unspecified chronic kidney disease: Secondary | ICD-10-CM

## 2017-01-19 DIAGNOSIS — R0602 Shortness of breath: Secondary | ICD-10-CM | POA: Diagnosis not present

## 2017-01-19 DIAGNOSIS — N179 Acute kidney failure, unspecified: Secondary | ICD-10-CM

## 2017-01-19 DIAGNOSIS — E860 Dehydration: Secondary | ICD-10-CM | POA: Insufficient documentation

## 2017-01-19 DIAGNOSIS — Z7982 Long term (current) use of aspirin: Secondary | ICD-10-CM | POA: Insufficient documentation

## 2017-01-19 DIAGNOSIS — C679 Malignant neoplasm of bladder, unspecified: Secondary | ICD-10-CM | POA: Insufficient documentation

## 2017-01-19 DIAGNOSIS — E1122 Type 2 diabetes mellitus with diabetic chronic kidney disease: Secondary | ICD-10-CM

## 2017-01-19 DIAGNOSIS — I2510 Atherosclerotic heart disease of native coronary artery without angina pectoris: Secondary | ICD-10-CM

## 2017-01-19 DIAGNOSIS — R531 Weakness: Secondary | ICD-10-CM

## 2017-01-19 DIAGNOSIS — N390 Urinary tract infection, site not specified: Secondary | ICD-10-CM

## 2017-01-19 DIAGNOSIS — I252 Old myocardial infarction: Secondary | ICD-10-CM | POA: Insufficient documentation

## 2017-01-19 DIAGNOSIS — R42 Dizziness and giddiness: Secondary | ICD-10-CM

## 2017-01-19 DIAGNOSIS — Z79899 Other long term (current) drug therapy: Secondary | ICD-10-CM

## 2017-01-19 DIAGNOSIS — N189 Chronic kidney disease, unspecified: Principal | ICD-10-CM

## 2017-01-19 DIAGNOSIS — I130 Hypertensive heart and chronic kidney disease with heart failure and stage 1 through stage 4 chronic kidney disease, or unspecified chronic kidney disease: Secondary | ICD-10-CM | POA: Diagnosis not present

## 2017-01-19 DIAGNOSIS — N184 Chronic kidney disease, stage 4 (severe): Secondary | ICD-10-CM | POA: Insufficient documentation

## 2017-01-19 LAB — URINALYSIS, ROUTINE W REFLEX MICROSCOPIC
Bilirubin Urine: NEGATIVE
Glucose, UA: NEGATIVE mg/dL
Hgb urine dipstick: NEGATIVE
Ketones, ur: NEGATIVE mg/dL
NITRITE: NEGATIVE
PH: 5 (ref 5.0–8.0)
Protein, ur: 30 mg/dL — AB
SPECIFIC GRAVITY, URINE: 1.018 (ref 1.005–1.030)

## 2017-01-19 LAB — BASIC METABOLIC PANEL
ANION GAP: 11 (ref 5–15)
BUN: 33 mg/dL — ABNORMAL HIGH (ref 6–20)
CHLORIDE: 99 mmol/L — AB (ref 101–111)
CO2: 21 mmol/L — ABNORMAL LOW (ref 22–32)
Calcium: 8.8 mg/dL — ABNORMAL LOW (ref 8.9–10.3)
Creatinine, Ser: 2.64 mg/dL — ABNORMAL HIGH (ref 0.44–1.00)
GFR calc non Af Amer: 16 mL/min — ABNORMAL LOW (ref >60)
GFR, EST AFRICAN AMERICAN: 18 mL/min — AB (ref >60)
Glucose, Bld: 212 mg/dL — ABNORMAL HIGH (ref 65–99)
Potassium: 3.9 mmol/L (ref 3.5–5.1)
SODIUM: 131 mmol/L — AB (ref 135–145)

## 2017-01-19 LAB — CBC
HEMATOCRIT: 27.5 % — AB (ref 36.0–46.0)
HEMOGLOBIN: 9.2 g/dL — AB (ref 12.0–15.0)
MCH: 33.2 pg (ref 26.0–34.0)
MCHC: 33.5 g/dL (ref 30.0–36.0)
MCV: 99.3 fL (ref 78.0–100.0)
Platelets: 186 10*3/uL (ref 150–400)
RBC: 2.77 MIL/uL — AB (ref 3.87–5.11)
RDW: 13.4 % (ref 11.5–15.5)
WBC: 9.2 10*3/uL (ref 4.0–10.5)

## 2017-01-19 LAB — CREATININE, URINE, RANDOM: Creatinine, Urine: 299.21 mg/dL

## 2017-01-19 LAB — SODIUM, URINE, RANDOM: SODIUM UR: 29 mmol/L

## 2017-01-19 LAB — CBG MONITORING, ED: GLUCOSE-CAPILLARY: 209 mg/dL — AB (ref 65–99)

## 2017-01-19 MED ORDER — SODIUM CHLORIDE 0.9 % IV BOLUS (SEPSIS)
1000.0000 mL | Freq: Once | INTRAVENOUS | Status: AC
  Administered 2017-01-19: 1000 mL via INTRAVENOUS

## 2017-01-19 MED ORDER — CEPHALEXIN 500 MG PO CAPS: 500.0000 mg | ORAL_CAPSULE | Freq: Two times a day (BID) | ORAL | 0 refills | Status: DC

## 2017-01-19 NOTE — Telephone Encounter (Signed)
Patients daughter called and stated patient is very dizzy today. So dizzy she doesn't want to get up. This is not new for the patient. She has meclizine that she takes but states it is not helping. Patient was seen in ER on Sunday for dehydration and received fluids according to the daughter. Asked daughter if patient was drinking enough fluids and she states she is not. Reviewed with RN. Explained to daughter that patient should go back to ER because she may be dehydrated again. Reiterated how important it is for patient to drink adequate fluids to remain hydrated. Daughter understands this but states patient just will not drink much.

## 2017-01-19 NOTE — ED Notes (Signed)
Pt ambulated to nurses station and back with use of walker, steady and even gait.

## 2017-01-19 NOTE — Telephone Encounter (Signed)
Received a phone call from the emergency room regarding Ms. Joann Hunter. She is apparently was seen in the emergency room for generalized weakness and feeling dizziness.   Her vitals are stable and she is hemodynamically stable However there was in slight increase in serum creatinine noted.  However she was non-oliguric and had no fluid retention.   I reviewed the previous CT scans and her labs she had previously some fluctuation in her creatinine.  Her last CT scan from December did suggest hydronephrosis due to obstruction from tumor however the kidney was atrophic. Unclear whether her  worsening kidney function is directly related to obstruction , rather more likely that she has a combination of medical kidney disease She is not in any apparent discomfort. And her electrolytes are stable.She was hydrated apparently in the emergency room because clearly she was not adequately taking fluids by mouth.  Since she is reportedly hemodynamically stable and since her family wanted her to be home tonight, she will be discharged from the emergency room today. She can be worked up with a repeat CT KUB as an outpatient, while closely monitoring her kidney function, electrolytes. If there is worsening obstruction, Urology consultation for bypassing the obstruction either with the stent or nephrostomy tube placement if the patient desires.   Will have her return on Monday to recheck kidney function.

## 2017-01-19 NOTE — Progress Notes (Signed)
ED Antimicrobial Stewardship Positive Culture Follow Up   Joann Hunter is an 84 y.o. female who presented to [facility] on 01/15/2017 with a chief complaint of  Chief Complaint  Patient presents with  . Dizziness    Recent Results (from the past 720 hour(s))  Urine culture     Status: Abnormal   Collection Time: 12/29/16  1:20 PM  Result Value Ref Range Status   Specimen Description URINE, CLEAN CATCH  Final   Special Requests NONE  Final   Culture >=100,000 COLONIES/mL CITROBACTER FREUNDII (A)  Final   Report Status 01/01/2017 FINAL  Final   Organism ID, Bacteria CITROBACTER FREUNDII (A)  Final      Susceptibility   Citrobacter freundii - MIC*    CEFAZOLIN >=64 RESISTANT Resistant     CEFTRIAXONE 16 INTERMEDIATE Intermediate     CIPROFLOXACIN <=0.25 SENSITIVE Sensitive     GENTAMICIN <=1 SENSITIVE Sensitive     IMIPENEM <=0.25 SENSITIVE Sensitive     NITROFURANTOIN <=16 SENSITIVE Sensitive     TRIMETH/SULFA >=320 RESISTANT Resistant     PIP/TAZO <=4 SENSITIVE Sensitive     * >=100,000 COLONIES/mL CITROBACTER FREUNDII  Urine Culture     Status: Abnormal   Collection Time: 01/15/17  2:41 PM  Result Value Ref Range Status   Specimen Description URINE, CLEAN CATCH  Final   Special Requests NONE  Final   Culture >=100,000 COLONIES/mL KLEBSIELLA PNEUMONIAE (A)  Final   Report Status 01/18/2017 FINAL  Final   Organism ID, Bacteria KLEBSIELLA PNEUMONIAE (A)  Final      Susceptibility   Klebsiella pneumoniae - MIC*    AMPICILLIN >=32 RESISTANT Resistant     CEFAZOLIN <=4 SENSITIVE Sensitive     CEFTRIAXONE <=1 SENSITIVE Sensitive     CIPROFLOXACIN <=0.25 SENSITIVE Sensitive     GENTAMICIN <=1 SENSITIVE Sensitive     IMIPENEM <=0.25 SENSITIVE Sensitive     NITROFURANTOIN 64 INTERMEDIATE Intermediate     TRIMETH/SULFA <=20 SENSITIVE Sensitive     AMPICILLIN/SULBACTAM 8 SENSITIVE Sensitive     PIP/TAZO <=4 SENSITIVE Sensitive     Extended ESBL NEGATIVE Sensitive     *  >=100,000 COLONIES/mL KLEBSIELLA PNEUMONIAE      New antibiotic prescription: no treatment needed   ED Provider: Geanie Kenning, PA-C  [name] 01/19/2017, 9:42 AM Infectious Diseases Pharmacist Phone# [phone]

## 2017-01-19 NOTE — ED Provider Notes (Signed)
[facility] EMERGENCY DEPARTMENT Provider Note   CSN: [id number] Arrival date & time: 01/19/17  1418     History   Chief Complaint Chief Complaint  Patient presents with  . Weakness    HPI Joann Hunter is a 84 y.o. female.  HPI  84 year old with history of urothilial cancer with liver mets, CAD, CHF, and metabolic conditions comes in with chief complaint of dizziness. According to patient she has had dizziness intermittently for the last several days.  Patient had a fall around Christmas because of the dizziness.  Patient has been seen in the ER twice this month because of dizziness, and was hydrated and discharged.  Patient reports that this morning she was feeling profoundly dizzy and was unable to walk, she was fearful of falling down and called the oncology clinic that advised her to come to the ED.  Patient reports that she drinks water adequately, daughter thinks that patient has not finished her water like she should.  Patient denies any associated abdominal pain, nausea, vomiting, diarrhea, chest pain, palpitations.  Patient has history of UTIs, but denies any dysuria or urinary frequency at this time.  Patient has been taking her pain medications for several years now, and she has no new blood pressure medicines.  Patient was started on chemotherapy on Monday, and has possibly slight reduced appetite.  Dizziness is described as lightheadedness, and typically it comes when patient is walking or standing.  Patient does not have any spinning sensation, she has been provided meclizine from the ER, but it is not helping.  Past Medical History:  Diagnosis Date  . Arthritis   . Back pain, chronic   . CAD (coronary artery disease)    NSTEMI 04/2011 with newly diagnosed 3V CAD s/p PCI/DES mLAD 04/11/11 with residual dz (per Dr. Burt Knack, should have consideration of PCI of the occluded RCA based on symptoms and/or consideration of outpatient nuclear perfusion testing after the patient  recovers from her infarct)  . Cancer ([place])    kidney ( Nov.2017)  . Chronic kidney disease (CKD), stage IV (severe) ([place]) 12/13/2013  . Hyperglycemia   . Hyperlipidemia   . Hypertension   . Hypothyroidism   . Ischemic cardiomyopathy    EF 45% by cath 04/20/11, 50-55% by echo 04/22/11. hypotension limiting medication titration   . Myocardial infarction ([place]) 2013  . Type 2 diabetes with nephropathy ([place]) 07/26/2014  . UTI (urinary tract infection) 12/13/2013    Patient Active Problem List   Diagnosis Date Noted  . Hypokalemia 06/16/2016  . Hip fracture, unspecified laterality, closed, initial encounter ([place]) 04/28/2016  . Ischemic cardiomyopathy 04/28/2016  . Hyperlipidemia 04/28/2016  . Cancer ([place]) 04/28/2016  . Hip fracture ([place]) 04/28/2016  . Dehydration 04/14/2016  . Orthostasis 02/24/2016  . Acute encephalopathy 02/24/2016  . Confusion 02/21/2016  . Neutropenia ([place]) 02/20/2016  . Thrombocytopenia ([place]) 02/20/2016  . ESBL (extended spectrum beta-lactamase) producing bacteria infection 02/20/2016  . Goals of care, counseling/discussion 02/02/2016  . Urothelial cancer ([place]) 01/01/2016  . Rectal bleeding 11/05/2014  . Absolute anemia 11/05/2014  . Hypothyroidism 11/05/2014  . Metabolic encephalopathy 40/34/7425  . Type 2 diabetes with nephropathy ([place]) 07/26/2014  . Essential hypertension 07/26/2014  . Hyponatremia 07/25/2014  . Vertigo 07/25/2014  . AKI (acute kidney injury) ([place]) 12/13/2013  . Fall 12/13/2013  . Lower urinary tract infectious disease 12/13/2013  . Chronic kidney disease (CKD), stage IV (severe) ([place]) 12/13/2013  . Chest pain at rest 04/26/2011  . Syncope, near 04/26/2011  .  CAD (coronary artery disease) 04/23/2011  . Cardiomyopathy, ischemic 04/23/2011  . Dyslipidemia 04/23/2011  . Myocardial infarction, anterior wall, initial care ([place]) 04/20/2011    Past Surgical History:  Procedure Laterality Date  . BACK SURGERY    . BREAST LUMPECTOMY      right  . CORONARY STENT PLACEMENT    . HIP ARTHROPLASTY Right 04/29/2016   Procedure: ARTHROPLASTY BIPOLAR HIP (HEMIARTHROPLASTY);  Surgeon: Carole Civil, MD;  Location: [facility];  Service: Orthopedics;  Laterality: Right;  . IR FLUORO GUIDE PORT INSERTION RIGHT  01/13/2017  . IR US GUIDE VASC ACCESS RIGHT  01/13/2017  . LEFT HEART CATHETERIZATION WITH CORONARY ANGIOGRAM N/A 04/20/2011   Procedure: LEFT HEART CATHETERIZATION WITH CORONARY ANGIOGRAM;  Surgeon: Burnell Blanks, MD;  Location: [facility] C[facility] LAB;  Service: Cardiovascular;  Laterality: N/A;  . PERCUTANEOUS CORONARY STENT INTERVENTION (PCI-S) N/A 04/21/2011   Procedure: PERCUTANEOUS CORONARY STENT INTERVENTION (PCI-S);  Surgeon: Sherren Mocha, MD;  Location: [facility] C[facility] LAB;  Service: Cardiovascular;  Laterality: N/A;    OB History    Gravida Para Term Preterm AB Living   2 2 2     2    SAB TAB Ectopic Multiple Live Births                   Home Medications    Prior to Admission medications   Medication Sig Start Date End Date Taking? Authorizing Provider  amLODipine (NORVASC) 5 MG tablet Take 1 tablet (5 mg total) by mouth daily. 05/03/16  Yes Elgergawy, Silver Huguenin, MD  aspirin EC 81 MG tablet Take 81 mg by mouth daily.   Yes [provider]  docusate sodium (COLACE) 100 MG capsule Take 1 capsule (100 mg total) by mouth 2 (two) times daily. Patient taking differently: Take 100 mg by mouth every other day.  11/06/14  Yes Kathie Dike, MD  feeding supplement, ENSURE ENLIVE, (ENSURE ENLIVE) LIQD Take 237 mLs by mouth 2 (two) times daily between meals. 05/02/16  Yes Elgergawy, Silver Huguenin, MD  fish oil-omega-3 fatty acids 1000 MG capsule Take 1 g by mouth daily.   Yes [provider]  Gemcitabine HCl (GEMZAR IV) Inject into the vein. Day 1,8,15 every 28 days   Yes [provider]  HYDROcodone-acetaminophen (NORCO/VICODIN) 5-325 MG tablet Take 1 tablet by mouth every 6 (six) hours as needed for moderate pain.  06/03/16  Yes Carole Civil, MD  levothyroxine (SYNTHROID, LEVOTHROID) 112 MCG tablet Take 1 tablet (112 mcg total) by mouth daily before breakfast. 09/08/16  Yes Holley Bouche, NP  lidocaine-prilocaine (EMLA) cream Apply a quarter size amount to affected area 1 hour prior to coming to chemotherapy.  Do not rub in.  Cover with plastic wrap. 01/09/17  Yes Jacquelin Hawking, NP  meclizine (ANTIVERT) 25 MG tablet Take 25 mg by mouth 4 (four) times daily as needed. Dizziness   Yes [provider]  metoprolol tartrate (LOPRESSOR) 25 MG tablet Take 1 tablet (25 mg total) by mouth 2 (two) times daily. Patient taking differently: Take 12.5 mg by mouth 2 (two) times daily.  05/02/16  Yes Elgergawy, Silver Huguenin, MD  pantoprazole (PROTONIX) 40 MG tablet Take 1 tablet (40 mg total) by mouth daily. 11/06/14  Yes Kathie Dike, MD  prochlorperazine (COMPAZINE) 10 MG tablet Take 1 tablet (10 mg total) by mouth every 6 (six) hours as needed for nausea or vomiting. 01/29/16  Yes Penland, Kelby Fam, MD  Vitamin D, Ergocalciferol, (DRISDOL) 50000 UNITS CAPS  Take 50,000 Units by mouth 2 (two) times a week. Tuesdays and Thurdays 05/30/12  Yes [provider]  cephALEXin (KEFLEX) 500 MG capsule Take 1 capsule (500 mg total) by mouth 2 (two) times daily. 01/19/17   Varney Biles, MD  nitroGLYCERIN (NITROSTAT) 0.4 MG SL tablet Place 0.4 mg under the tongue every 5 (five) minutes x 3 doses as needed. For chest pain. 04/23/11   Dunn, Nedra Hai, PA-C  promethazine (PHENERGAN) 25 MG tablet Take 25 mg by mouth every 6 (six) hours as needed for nausea or vomiting.    [provider]    Family History Family History  Problem Relation Age of Onset  . Other Mother   . Cancer Father   . Other Unknown        no known family cardiac disease  . Other Brother        Diptheria  . Depression Sister   . Pneumonia Sister   . Other Brother        murdered    Social History Social History   Tobacco Use    . Smoking status: Never Smoker  . Smokeless tobacco: Never Used  Substance Use Topics  . Alcohol use: No  . Drug use: No     Allergies   Ciprofloxacin; Nitrofurantoin; Lipitor [atorvastatin]; and Penicillins   Review of Systems Review of Systems  Constitutional: Positive for activity change.  Cardiovascular: Negative for chest pain.  Gastrointestinal: Negative for abdominal pain, diarrhea and vomiting.  Neurological: Positive for light-headedness. Negative for syncope.  All other systems reviewed and are negative.    Physical Exam Updated Vital Signs BP (!) 146/58   Pulse 81   Temp 98.5 F (36.9 C) (Oral)   Resp 17   Ht 5\' 2"  (1.575 m)   Wt 82.1 kg (181 lb)   SpO2 97%   BMI 33.11 kg/m   Physical Exam  Constitutional: She is oriented to person, place, and time. She appears well-developed.  HENT:  Head: Normocephalic and atraumatic.  Eyes: EOM are normal.  Neck: Normal range of motion. Neck supple.  Cardiovascular: Normal rate.  Pulmonary/Chest: Effort normal.  Abdominal: Soft. Bowel sounds are normal.  Neurological: She is alert and oriented to person, place, and time. No cranial nerve deficit.  Skin: Skin is warm and dry.  Nursing note and vitals reviewed.    ED Treatments / Results  Labs (all labs ordered are listed, but only abnormal results are displayed) Labs Reviewed  BASIC METABOLIC PANEL - Abnormal; Notable for the following components:      Result Value   Sodium 131 (*)    Chloride 99 (*)    CO2 21 (*)    Glucose, Bld 212 (*)    BUN 33 (*)    Creatinine, Ser 2.64 (*)    Calcium 8.8 (*)    GFR calc non Af Amer 16 (*)    GFR calc Af Amer 18 (*)    All other components within normal limits  CBC - Abnormal; Notable for the following components:   RBC 2.77 (*)    Hemoglobin 9.2 (*)    HCT 27.5 (*)    All other components within normal limits  URINALYSIS, ROUTINE W REFLEX MICROSCOPIC - Abnormal; Notable for the following components:   Color,  Urine AMBER (*)    APPearance CLOUDY (*)    Protein, ur 30 (*)    Leukocytes, UA MODERATE (*)    Bacteria, UA MANY (*)    Squamous  Epithelial / LPF 6-30 (*)    Non Squamous Epithelial 0-5 (*)    All other components within normal limits  CBG MONITORING, ED - Abnormal; Notable for the following components:   Glucose-Capillary 209 (*)    All other components within normal limits  SODIUM, URINE, RANDOM  CREATININE, URINE, RANDOM    EKG  EKG Interpretation  Date/Time:  Thursday January 19 2017 15:12:25 EST Ventricular Rate:  73 PR Interval:  190 QRS Duration: 72 QT Interval:  388 QTC Calculation: 427 R Axis:   10 Text Interpretation:  Normal sinus rhythm Possible Left atrial enlargement Low voltage QRS Possible Anterolateral infarct , age undetermined Abnormal ECG No acute changes Confirmed by Varney Biles [phone]) on 01/19/2017 6:27:43 PM       Radiology Ct Abdomen Pelvis Wo Contrast  Result Date: 01/19/2017 CLINICAL DATA:  Metastatic right upper tract urothelial carcinoma. Dizziness. EXAM: CT ABDOMEN AND PELVIS WITHOUT CONTRAST TECHNIQUE: Multidetector CT imaging of the abdomen and pelvis was performed following the standard protocol without IV contrast. COMPARISON:  12/23/2016 CT chest, abdomen and pelvis. FINDINGS: Lower chest: Hypoventilatory changes at the dependent lung bases. Coronary atherosclerosis. Hepatobiliary: Finely irregular liver surface, compatible with cirrhosis. Subtle hypodense 1.9 x 1.2 cm segment 4B left liver lobe lesion (series 2/image 20), previously 1.4 x 0.9 cm, mildly increased. No additional liver lesions. Normal gallbladder with no radiopaque cholelithiasis. No biliary ductal dilatation. Pancreas: Normal, with no mass or duct dilation. Spleen: Normal size. No mass. Adrenals/Urinary Tract: Normal adrenals. Right ureteropelvic junction 4.6 x 3.7 cm mass (series 2/image 35), previously 4.3 x 3.6 cm, mildly increased. Stable asymmetric right renal atrophy with  stable tumoral dilatation of the right renal collecting system. Right lumbar ureter 2.5 x 1.7 cm mass (series 2/image 43), previously 2.3 x 1.4 cm, mildly increased. No left hydronephrosis. No contour deforming left renal mass. No left renal stones. Bladder is obscured by streak artifact from the right hip hardware. Tiny foci of gas in the nondependent bladder, presumably due to recent bladder instrumentation. No definite bladder wall thickening. Stomach/Bowel: Small hiatal hernia. Otherwise normal nondistended stomach. Normal caliber small bowel with no small bowel wall thickening. Normal appendix. Normal large bowel with no diverticulosis, large bowel wall thickening or pericolonic fat stranding. Vascular/Lymphatic: Atherosclerotic nonaneurysmal abdominal aorta. No pathologically enlarged lymph nodes in the abdomen or pelvis. Reproductive: Grossly normal uterus.  No adnexal mass. Other: No pneumoperitoneum, ascites or focal fluid collection. Musculoskeletal: No aggressive appearing focal osseous lesions. Right total hip arthroplasty. Stable moderate L2 vertebral compression fracture. Marked thoracolumbar spondylosis. IMPRESSION: 1. Dominant right UPJ tumor has increased in size. 2. Separate right lumbar ureter tumor has also increased in size. 3. Solitary segment 4B left liver lobe metastasis appears mildly increased in size. 4. No new sites of metastatic disease in the abdomen or pelvis. 5. Chronic findings include: Aortic Atherosclerosis (ICD10-I70.0). Coronary atherosclerosis. Hepatic cirrhosis. Small hiatal hernia. Electronically Signed   By: Ilona Sorrel M.D.   On: 01/19/2017 21:47    Procedures Procedures (including critical care time)  Medications Ordered in ED Medications  sodium chloride 0.9 % bolus 1,000 mL (0 mLs Intravenous Stopped 01/19/17 2056)     Initial Impression / Assessment and Plan / ED Course  I have reviewed the triage vital signs and the nursing notes.  Pertinent labs &  imaging results that were available during my care of the patient were reviewed by me and considered in my medical decision making (see chart for details).  Clinical  Course as of Jan 19 2242  Thu Jan 19, 2017  2143 I spoke with Dr. [place], patient's oncologist about patient's worsening renal function.  I informed her that given the worsening mass, the worsening renal function could be primary renal issue and not prerenal problem.  She reviewed patient's prior imaging and recommends that we call urology for a close follow-up.  She has also set up an appointment on Monday for patient to come in for a repeat creatinine check at the cancer center.  [AN]  2144 Dr. Noah Delaine, from urology will see the patient next week on Wednesday.  In the interim, patient's urine sodium and urine creatinine is back.  Fina calculation does reveal that the cause for acute kidney injury is prerenal in nature.  Patient is already received IV fluids in the ED, and we have already discussed with her the need for proper hydration at home.  Patient has passed oral challenge in the ER.  CT scan was ordered at the request of the oncologist, which is pending at this time.  Once the CT scan results are back patient will be discharged home.  Patient's family would appreciate home health, and I will put in a face-to-face order for it..  [AN]  2243 UA is equivocal for UTI. Patient denies any dysuria, frequent urination.  Patient has had UTI in the past, with her presenting symptoms being dizziness and confusion.  At this time patient is not confused, but she is having dizziness.  We checked the sensitivities of the urine culture in the past, and patient has had multiple microbes.  Patient is pan sensitive to Cipro, however she is allergic to Cipro.  I will give her Keflex prescription as wait and see approach.  1 of the microbes was resistant to Keflex, therefore we will send her urine for cultures in case patient's symptoms deteriorate and  is not responding to antibiotics.  All the follow-up instructions have been discussed with the family.. WBC, UA: TOO NUMEROUS TO COUNT [AN]    Clinical Course User Index [AN] Varney Biles, MD    84 year old female with history of CHF, metabolic conditions, GU cancer with liver metastases comes in with chief complaint of dizziness.  Patient's dizziness has been intermittent since Christmas time, and she has had a fall from it.  Patient does not appear to be dehydrated clinically, however her BUN and creatinine are elevated today, with the creatinine being at 2.6.  Hemoglobin is stable.  Based on history it appears that patient has been not taking in fluids like she should.  Final Clinical Impressions(s) / ED Diagnoses   Final diagnoses:  Acute renal failure superimposed on chronic kidney disease, unspecified CKD stage, unspecified acute renal failure type ([place])  Dehydration  Dizziness  Urinary tract infection without hematuria, site unspecified    ED Discharge Orders        Ordered    cephALEXin (KEFLEX) 500 MG capsule  2 times daily     01/19/17 2242       Varney Biles, MD 01/19/17 2245

## 2017-01-19 NOTE — Discharge Instructions (Signed)
Please take the antibiotics if your symptoms continue to get worse despite taking fluids, or you start having new confusion.  Otherwise, go to the [facility] for electrolyte check on Monday.  Also see the urologist on Wednesday.  If you do not receive a call from the urology clinic by tomorrow afternoon, call them and confirm an appointment for the coming Wednesday.

## 2017-01-19 NOTE — ED Notes (Signed)
Pt started chemo on Monday, has had increased weakness. Family states she recently had a UTI, they took her off the antibx prior to finishing course and did not get another antibx. Pt eating and drinking like normal.

## 2017-01-19 NOTE — ED Triage Notes (Signed)
Patient here from the cancer center due to weakness. Patient had chemo on Monday and had power port placed Friday, 01/14/2016. She now states dizziness and weakness.

## 2017-01-19 NOTE — Telephone Encounter (Signed)
Post ED Visit - Positive Culture Follow-up  Culture report reviewed by antimicrobial stewardship pharmacist:  []  Elenor Quinones, Pharm.D. []  Heide Guile, Pharm.D., BCPS AQ-ID []  Parks Neptune, Pharm.D., BCPS []  Alycia Rossetti, Pharm.D., BCPS []  [place], Pharm.D., BCPS, AAHIVP []  Legrand Como, Pharm.D., BCPS, AAHIVP []  Salome Arnt, PharmD, BCPS []  [name], PharmD []  Vincenza Hews, PharmD, BCPS [name], Pharm D Positive urine culture Chart reviewed by Ardyth Harps and no further patient follow-up is required at this time.  Harlon Flor [facility] 01/19/2017, 10:54 AM

## 2017-01-21 ENCOUNTER — Encounter (HOSPITAL_COMMUNITY): Payer: Self-pay | Admitting: Emergency Medicine

## 2017-01-21 ENCOUNTER — Emergency Department (HOSPITAL_COMMUNITY): Payer: Medicare Other

## 2017-01-21 ENCOUNTER — Inpatient Hospital Stay (HOSPITAL_COMMUNITY)
Admission: EM | Admit: 2017-01-21 | Discharge: 2017-01-25 | DRG: 291 | Disposition: A | Discharge status: Home Health | Payer: Medicare Other | Attending: Family Medicine | Admitting: Family Medicine

## 2017-01-21 ENCOUNTER — Other Ambulatory Visit: Payer: Self-pay

## 2017-01-21 ENCOUNTER — Inpatient Hospital Stay (HOSPITAL_COMMUNITY): Payer: Medicare Other

## 2017-01-21 DIAGNOSIS — Z79890 Hormone replacement therapy (postmenopausal): Secondary | ICD-10-CM | POA: Diagnosis not present

## 2017-01-21 DIAGNOSIS — Z880 Allergy status to penicillin: Secondary | ICD-10-CM

## 2017-01-21 DIAGNOSIS — E039 Hypothyroidism, unspecified: Secondary | ICD-10-CM | POA: Diagnosis present

## 2017-01-21 DIAGNOSIS — Z79899 Other long term (current) drug therapy: Secondary | ICD-10-CM

## 2017-01-21 DIAGNOSIS — J811 Chronic pulmonary edema: Secondary | ICD-10-CM | POA: Diagnosis present

## 2017-01-21 DIAGNOSIS — C689 Malignant neoplasm of urinary organ, unspecified: Secondary | ICD-10-CM

## 2017-01-21 DIAGNOSIS — N170 Acute kidney failure with tubular necrosis: Secondary | ICD-10-CM | POA: Diagnosis not present

## 2017-01-21 DIAGNOSIS — Z888 Allergy status to other drugs, medicaments and biological substances status: Secondary | ICD-10-CM

## 2017-01-21 DIAGNOSIS — N184 Chronic kidney disease, stage 4 (severe): Secondary | ICD-10-CM | POA: Diagnosis not present

## 2017-01-21 DIAGNOSIS — M7989 Other specified soft tissue disorders: Secondary | ICD-10-CM

## 2017-01-21 DIAGNOSIS — M79662 Pain in left lower leg: Secondary | ICD-10-CM

## 2017-01-21 DIAGNOSIS — D649 Anemia, unspecified: Secondary | ICD-10-CM | POA: Diagnosis present

## 2017-01-21 DIAGNOSIS — I10 Essential (primary) hypertension: Secondary | ICD-10-CM | POA: Diagnosis present

## 2017-01-21 DIAGNOSIS — N390 Urinary tract infection, site not specified: Secondary | ICD-10-CM | POA: Diagnosis present

## 2017-01-21 DIAGNOSIS — Z881 Allergy status to other antibiotic agents status: Secondary | ICD-10-CM | POA: Diagnosis not present

## 2017-01-21 DIAGNOSIS — I255 Ischemic cardiomyopathy: Secondary | ICD-10-CM | POA: Diagnosis present

## 2017-01-21 DIAGNOSIS — R0602 Shortness of breath: Secondary | ICD-10-CM

## 2017-01-21 DIAGNOSIS — I509 Heart failure, unspecified: Secondary | ICD-10-CM | POA: Diagnosis not present

## 2017-01-21 DIAGNOSIS — E1122 Type 2 diabetes mellitus with diabetic chronic kidney disease: Secondary | ICD-10-CM | POA: Diagnosis present

## 2017-01-21 DIAGNOSIS — E1165 Type 2 diabetes mellitus with hyperglycemia: Secondary | ICD-10-CM | POA: Diagnosis present

## 2017-01-21 DIAGNOSIS — J810 Acute pulmonary edema: Secondary | ICD-10-CM | POA: Diagnosis not present

## 2017-01-21 DIAGNOSIS — L89892 Pressure ulcer of other site, stage 2: Secondary | ICD-10-CM | POA: Diagnosis present

## 2017-01-21 DIAGNOSIS — E1121 Type 2 diabetes mellitus with diabetic nephropathy: Secondary | ICD-10-CM | POA: Diagnosis present

## 2017-01-21 DIAGNOSIS — L8990 Pressure ulcer of unspecified site, unspecified stage: Secondary | ICD-10-CM

## 2017-01-21 DIAGNOSIS — N189 Chronic kidney disease, unspecified: Secondary | ICD-10-CM | POA: Diagnosis not present

## 2017-01-21 DIAGNOSIS — C787 Secondary malignant neoplasm of liver and intrahepatic bile duct: Secondary | ICD-10-CM | POA: Diagnosis present

## 2017-01-21 DIAGNOSIS — I130 Hypertensive heart and chronic kidney disease with heart failure and stage 1 through stage 4 chronic kidney disease, or unspecified chronic kidney disease: Principal | ICD-10-CM | POA: Diagnosis present

## 2017-01-21 DIAGNOSIS — E871 Hypo-osmolality and hyponatremia: Secondary | ICD-10-CM

## 2017-01-21 DIAGNOSIS — Z7982 Long term (current) use of aspirin: Secondary | ICD-10-CM | POA: Diagnosis not present

## 2017-01-21 DIAGNOSIS — N179 Acute kidney failure, unspecified: Secondary | ICD-10-CM | POA: Diagnosis not present

## 2017-01-21 DIAGNOSIS — I2510 Atherosclerotic heart disease of native coronary artery without angina pectoris: Secondary | ICD-10-CM | POA: Diagnosis present

## 2017-01-21 DIAGNOSIS — I5033 Acute on chronic diastolic (congestive) heart failure: Secondary | ICD-10-CM | POA: Diagnosis present

## 2017-01-21 LAB — CBC
HCT: 27.7 % — ABNORMAL LOW (ref 36.0–46.0)
Hemoglobin: 9.3 g/dL — ABNORMAL LOW (ref 12.0–15.0)
MCH: 32.6 pg (ref 26.0–34.0)
MCHC: 33.6 g/dL (ref 30.0–36.0)
MCV: 97.2 fL (ref 78.0–100.0)
PLATELETS: 160 10*3/uL (ref 150–400)
RBC: 2.85 MIL/uL — ABNORMAL LOW (ref 3.87–5.11)
RDW: 13 % (ref 11.5–15.5)
WBC: 4.1 10*3/uL (ref 4.0–10.5)

## 2017-01-21 LAB — BASIC METABOLIC PANEL
Anion gap: 10 (ref 5–15)
BUN: 34 mg/dL — AB (ref 6–20)
CALCIUM: 8.3 mg/dL — AB (ref 8.9–10.3)
CHLORIDE: 97 mmol/L — AB (ref 101–111)
CO2: 19 mmol/L — AB (ref 22–32)
CREATININE: 2.71 mg/dL — AB (ref 0.44–1.00)
GFR calc Af Amer: 18 mL/min — ABNORMAL LOW (ref >60)
GFR calc non Af Amer: 15 mL/min — ABNORMAL LOW (ref >60)
Glucose, Bld: 189 mg/dL — ABNORMAL HIGH (ref 65–99)
Potassium: 4 mmol/L (ref 3.5–5.1)
SODIUM: 126 mmol/L — AB (ref 135–145)

## 2017-01-21 LAB — GLUCOSE, CAPILLARY
GLUCOSE-CAPILLARY: 171 mg/dL — AB (ref 65–99)
Glucose-Capillary: 172 mg/dL — ABNORMAL HIGH (ref 65–99)

## 2017-01-21 LAB — BRAIN NATRIURETIC PEPTIDE: B Natriuretic Peptide: 209 pg/mL — ABNORMAL HIGH (ref 0.0–100.0)

## 2017-01-21 LAB — TROPONIN I: Troponin I: <0.03 ng/mL (ref <0.03)

## 2017-01-21 MED ORDER — SODIUM CHLORIDE 0.9 % IV SOLN: 250.0000 mL | INTRAVENOUS | Status: DC | PRN

## 2017-01-21 MED ORDER — ASPIRIN EC 81 MG PO TBEC
81.0000 mg | DELAYED_RELEASE_TABLET | Freq: Every day | ORAL | Status: DC
  Administered 2017-01-22 – 2017-01-25 (×4): 81 mg via ORAL
  Filled 2017-01-21 (×4): qty 1

## 2017-01-21 MED ORDER — FUROSEMIDE 10 MG/ML IJ SOLN
40.0000 mg | Freq: Two times a day (BID) | INTRAMUSCULAR | Status: DC
  Administered 2017-01-21 – 2017-01-24 (×7): 40 mg via INTRAVENOUS
  Filled 2017-01-21 (×8): qty 4

## 2017-01-21 MED ORDER — POTASSIUM CHLORIDE CRYS ER 20 MEQ PO TBCR
20.0000 meq | EXTENDED_RELEASE_TABLET | Freq: Two times a day (BID) | ORAL | Status: DC
  Administered 2017-01-21 – 2017-01-25 (×8): 20 meq via ORAL
  Filled 2017-01-21 (×9): qty 1

## 2017-01-21 MED ORDER — PANTOPRAZOLE SODIUM 40 MG PO TBEC
40.0000 mg | DELAYED_RELEASE_TABLET | Freq: Every day | ORAL | Status: DC
  Administered 2017-01-22 – 2017-01-25 (×4): 40 mg via ORAL
  Filled 2017-01-21 (×4): qty 1

## 2017-01-21 MED ORDER — ENSURE ENLIVE PO LIQD
237.0000 mL | Freq: Two times a day (BID) | ORAL | Status: DC
  Administered 2017-01-23 – 2017-01-25 (×5): 237 mL via ORAL

## 2017-01-21 MED ORDER — DEXTROSE 5 % IV SOLN
1.0000 g | INTRAVENOUS | Status: DC
  Administered 2017-01-21 – 2017-01-24 (×4): 1 g via INTRAVENOUS
  Filled 2017-01-21 (×6): qty 10

## 2017-01-21 MED ORDER — MECLIZINE HCL 12.5 MG PO TABS
25.0000 mg | ORAL_TABLET | Freq: Four times a day (QID) | ORAL | Status: DC | PRN
  Administered 2017-01-22: 25 mg via ORAL
  Filled 2017-01-21: qty 2

## 2017-01-21 MED ORDER — IPRATROPIUM-ALBUTEROL 0.5-2.5 (3) MG/3ML IN SOLN
3.0000 mL | Freq: Once | RESPIRATORY_TRACT | Status: AC
  Administered 2017-01-21: 3 mL via RESPIRATORY_TRACT
  Filled 2017-01-21: qty 3

## 2017-01-21 MED ORDER — METOPROLOL TARTRATE 25 MG PO TABS
12.5000 mg | ORAL_TABLET | Freq: Two times a day (BID) | ORAL | Status: DC
  Administered 2017-01-21 – 2017-01-25 (×8): 12.5 mg via ORAL
  Filled 2017-01-21 (×8): qty 1

## 2017-01-21 MED ORDER — ALBUTEROL SULFATE (2.5 MG/3ML) 0.083% IN NEBU
5.0000 mg | INHALATION_SOLUTION | Freq: Once | RESPIRATORY_TRACT | Status: AC
  Administered 2017-01-21: 5 mg via RESPIRATORY_TRACT
  Filled 2017-01-21: qty 6

## 2017-01-21 MED ORDER — PROCHLORPERAZINE MALEATE 5 MG PO TABS: 10.0000 mg | ORAL_TABLET | Freq: Four times a day (QID) | ORAL | Status: DC | PRN

## 2017-01-21 MED ORDER — ACETAMINOPHEN 325 MG PO TABS: 650.0000 mg | ORAL_TABLET | ORAL | Status: DC | PRN

## 2017-01-21 MED ORDER — HYDROCODONE-ACETAMINOPHEN 5-325 MG PO TABS
1.0000 | ORAL_TABLET | Freq: Four times a day (QID) | ORAL | Status: DC | PRN
  Administered 2017-01-23: 1 via ORAL
  Filled 2017-01-21: qty 1

## 2017-01-21 MED ORDER — INSULIN ASPART 100 UNIT/ML ~~LOC~~ SOLN
0.0000 [IU] | Freq: Three times a day (TID) | SUBCUTANEOUS | Status: DC
  Administered 2017-01-21 – 2017-01-22 (×2): 3 [IU] via SUBCUTANEOUS
  Administered 2017-01-22: 2 [IU] via SUBCUTANEOUS
  Administered 2017-01-22 – 2017-01-23 (×2): 3 [IU] via SUBCUTANEOUS
  Administered 2017-01-23: 5 [IU] via SUBCUTANEOUS
  Administered 2017-01-23: 3 [IU] via SUBCUTANEOUS
  Administered 2017-01-24: 5 [IU] via SUBCUTANEOUS
  Administered 2017-01-24 – 2017-01-25 (×4): 3 [IU] via SUBCUTANEOUS

## 2017-01-21 MED ORDER — SODIUM CHLORIDE 0.9% FLUSH: 3.0000 mL | INTRAVENOUS | Status: DC | PRN

## 2017-01-21 MED ORDER — LEVOTHYROXINE SODIUM 112 MCG PO TABS
112.0000 ug | ORAL_TABLET | Freq: Every day | ORAL | Status: DC
  Administered 2017-01-22 – 2017-01-25 (×4): 112 ug via ORAL
  Filled 2017-01-21 (×3): qty 1

## 2017-01-21 MED ORDER — ONDANSETRON HCL 4 MG/2ML IJ SOLN: 4.0000 mg | Freq: Four times a day (QID) | INTRAMUSCULAR | Status: DC | PRN

## 2017-01-21 MED ORDER — DOCUSATE SODIUM 100 MG PO CAPS
100.0000 mg | ORAL_CAPSULE | ORAL | Status: DC
  Administered 2017-01-23 – 2017-01-25 (×2): 100 mg via ORAL
  Filled 2017-01-21 (×4): qty 1

## 2017-01-21 MED ORDER — SODIUM CHLORIDE 0.9% FLUSH
3.0000 mL | Freq: Two times a day (BID) | INTRAVENOUS | Status: DC
  Administered 2017-01-21 – 2017-01-25 (×8): 3 mL via INTRAVENOUS

## 2017-01-21 MED ORDER — FUROSEMIDE 10 MG/ML IJ SOLN
40.0000 mg | Freq: Once | INTRAMUSCULAR | Status: AC
  Administered 2017-01-21: 40 mg via INTRAVENOUS
  Filled 2017-01-21: qty 4

## 2017-01-21 MED ORDER — ENOXAPARIN SODIUM 30 MG/0.3ML ~~LOC~~ SOLN
30.0000 mg | SUBCUTANEOUS | Status: DC
  Administered 2017-01-21 – 2017-01-24 (×4): 30 mg via SUBCUTANEOUS
  Filled 2017-01-21 (×4): qty 0.3

## 2017-01-21 MED ORDER — INSULIN ASPART 100 UNIT/ML ~~LOC~~ SOLN
0.0000 [IU] | Freq: Every day | SUBCUTANEOUS | Status: DC
  Administered 2017-01-22 – 2017-01-23 (×2): 2 [IU] via SUBCUTANEOUS

## 2017-01-21 MED ORDER — PROMETHAZINE HCL 12.5 MG PO TABS: 25.0000 mg | ORAL_TABLET | Freq: Four times a day (QID) | ORAL | Status: DC | PRN

## 2017-01-21 NOTE — ED Notes (Signed)
RT at bedside.

## 2017-01-21 NOTE — ED Triage Notes (Signed)
Pt c/o shortness of breath since last night. Pt denies any recent acute illness. Pt received her first Chemo treatment on 01/16/17 for urothelial CA. Pt alert and oriented x 3. Pt denies pain, cough, fever.

## 2017-01-21 NOTE — H&P (Signed)
History and Physical  Joann Hunter MCN:[id number] DOB: 08-28-1932 DOA: 01/21/2017  Referring physician: Dr Tomi Bamberger, ED physician PCP: Monico Blitz, MD  Outpatient Specialists:  Talbert Cage (Oncology)   Patient Coming From: home  Chief Complaint: SOB  HPI: Joann Hunter is a 84 y.o. female with a history of stage IV chronic kidney disease, hypertension, hypothyroidism, ischemic cardiomyopathy (NSTEMI in 04/2011) with EF of 45% by cath on 04/2011 and 50-55% on echo 04/2011.  She also has type 2 diabetes and urothelial cancer stage IV with liver metastasis currently undergoing chemotherapy.  The patient has been to the emergency department 3 times in the past week for  vertigo symptoms which have been intermittent.  Per the EDP notes, patient was assumed to be intravascularly dry was given IV fluids both Monday and oncology and on Thursday in the emergency department.  Patient was also noted to have UTI on Thursday and was prescribed Keflex -she is only taken 1 dose, which was last night.  She presents today with 3-4 days of worsening shortness of breath, worse with with exertion and improved with rest.  Initially, her shortness of breath was intermittent and primarily with exertion, but now it is more persistent and is occurring mildly at rest.  No other palliating or provoking factors.  Difficulty laying flat.  Does note swelling of lower extremities bilaterally, which is been worsening.  She also has pain in her left medial thigh with some mild erythema.  Denies fevers, chills, nausea, vomiting.  Denies chest pain  Emergency Department Course: Chest x-ray shows cardiomegaly with mild pulmonary edema.  BNP was 209.  Hyponatremic at 126 with a creatinine of 2.71.  Patient also had blood work approximately a week ago which showed her creatinine to be 1.68 and her albumin to be 2.8 and total protein 5.8.  Review of Systems:   Pt denies any fevers, chills, nausea, vomiting, diarrhea, constipation,  abdominal pain, shortness of breath, dyspnea on exertion, orthopnea, cough, wheezing, palpitations, headache, vision changes, lightheadedness, dizziness, melena, rectal bleeding.  Review of systems are otherwise negative  Past Medical History:  Diagnosis Date  . Arthritis   . Back pain, chronic   . CAD (coronary artery disease)    NSTEMI 04/2011 with newly diagnosed 3V CAD s/p PCI/DES mLAD 04/11/11 with residual dz (per Dr. Burt Knack, should have consideration of PCI of the occluded RCA based on symptoms and/or consideration of outpatient nuclear perfusion testing after the patient recovers from her infarct)  . Cancer ([place])    kidney ( Nov.2017)  . Chronic kidney disease (CKD), stage IV (severe) ([place]) 12/13/2013  . Hyperglycemia   . Hyperlipidemia   . Hypertension   . Hypothyroidism   . Ischemic cardiomyopathy    EF 45% by cath 04/20/11, 50-55% by echo 04/22/11. hypotension limiting medication titration   . Myocardial infarction ([place]) 2013  . Type 2 diabetes with nephropathy ([place]) 07/26/2014  . UTI (urinary tract infection) 12/13/2013   Past Surgical History:  Procedure Laterality Date  . BACK SURGERY    . BREAST LUMPECTOMY     right  . CORONARY STENT PLACEMENT    . HIP ARTHROPLASTY Right 04/29/2016   Procedure: ARTHROPLASTY BIPOLAR HIP (HEMIARTHROPLASTY);  Surgeon: Carole Civil, MD;  Location: [facility];  Service: Orthopedics;  Laterality: Right;  . IR FLUORO GUIDE PORT INSERTION RIGHT  01/13/2017  . IR US GUIDE VASC ACCESS RIGHT  01/13/2017  . LEFT HEART CATHETERIZATION WITH CORONARY ANGIOGRAM N/A 04/20/2011   Procedure: LEFT  HEART CATHETERIZATION WITH CORONARY ANGIOGRAM;  Surgeon: Burnell Blanks, MD;  Location: [facility] CATH LAB;  Service: Cardiovascular;  Laterality: N/A;  . PERCUTANEOUS CORONARY STENT INTERVENTION (PCI-S) N/A 04/21/2011   Procedure: PERCUTANEOUS CORONARY STENT INTERVENTION (PCI-S);  Surgeon: Sherren Mocha, MD;  Location: [facility] CATH LAB;  Service: Cardiovascular;   Laterality: N/A;   Social History:  reports that  has never smoked. she has never used smokeless tobacco. She reports that she does not drink alcohol or use drugs. Patient lives at home  Allergies  Allergen Reactions  . Ciprofloxacin Other (See Comments)    Hallucination  . Nitrofurantoin Other (See Comments)    Dizziness: Resulting in a fall  . Lipitor [Atorvastatin] Other (See Comments)    Muscle Weakness  . Penicillins Rash    Has patient had a PCN reaction causing immediate rash, facial/tongue/throat swelling, SOB or lightheadedness with hypotension: unknown Has patient had a PCN reaction causing severe rash involving mucus membranes or skin necrosis: unknown Has patient had a PCN reaction that required hospitalization: unknown Has patient had a PCN reaction occurring within the last 10 years: unknown If all of the above answers are "NO", then may proceed with Cephalosporin use. 2    Family History  Problem Relation Age of Onset  . Other Mother   . Cancer Father   . Other Unknown        no known family cardiac disease  . Other Brother        Diptheria  . Depression Sister   . Pneumonia Sister   . Other Brother        murdered      Prior to Admission medications   Medication Sig Start Date End Date Taking? Authorizing Provider  amLODipine (NORVASC) 5 MG tablet Take 1 tablet (5 mg total) by mouth daily. 05/03/16   Elgergawy, Silver Huguenin, MD  aspirin EC 81 MG tablet Take 81 mg by mouth daily.    [provider]  cephALEXin (KEFLEX) 500 MG capsule Take 1 capsule (500 mg total) by mouth 2 (two) times daily. 01/19/17   Varney Biles, MD  docusate sodium (COLACE) 100 MG capsule Take 1 capsule (100 mg total) by mouth 2 (two) times daily. Patient taking differently: Take 100 mg by mouth every other day.  11/06/14   Kathie Dike, MD  feeding supplement, ENSURE ENLIVE, (ENSURE ENLIVE) LIQD Take 237 mLs by mouth 2 (two) times daily between meals. 05/02/16   Elgergawy, Silver Huguenin, MD  fish oil-omega-3 fatty acids 1000 MG capsule Take 1 g by mouth daily.    [provider]  Gemcitabine HCl (GEMZAR IV) Inject into the vein. Day 1,8,15 every 28 days    [provider]  HYDROcodone-acetaminophen (NORCO/VICODIN) 5-325 MG tablet Take 1 tablet by mouth every 6 (six) hours as needed for moderate pain. 06/03/16   Carole Civil, MD  levothyroxine (SYNTHROID, LEVOTHROID) 112 MCG tablet Take 1 tablet (112 mcg total) by mouth daily before breakfast. 09/08/16   Holley Bouche, NP  lidocaine-prilocaine (EMLA) cream Apply a quarter size amount to affected area 1 hour prior to coming to chemotherapy.  Do not rub in.  Cover with plastic wrap. 01/09/17   Jacquelin Hawking, NP  meclizine (ANTIVERT) 25 MG tablet Take 25 mg by mouth 4 (four) times daily as needed. Dizziness    [provider]  metoprolol tartrate (LOPRESSOR) 25 MG tablet Take 1 tablet (25 mg total) by mouth 2 (two) times daily. Patient taking differently:  Take 12.5 mg by mouth 2 (two) times daily.  05/02/16   Elgergawy, Silver Huguenin, MD  nitroGLYCERIN (NITROSTAT) 0.4 MG SL tablet Place 0.4 mg under the tongue every 5 (five) minutes x 3 doses as needed. For chest pain. 04/23/11   Dunn, Nedra Hai, PA-C  pantoprazole (PROTONIX) 40 MG tablet Take 1 tablet (40 mg total) by mouth daily. 11/06/14   Kathie Dike, MD  prochlorperazine (COMPAZINE) 10 MG tablet Take 1 tablet (10 mg total) by mouth every 6 (six) hours as needed for nausea or vomiting. 01/29/16   Penland, Kelby Fam, MD  promethazine (PHENERGAN) 25 MG tablet Take 25 mg by mouth every 6 (six) hours as needed for nausea or vomiting.    [provider]  Vitamin D, Ergocalciferol, (DRISDOL) 50000 UNITS CAPS Take 50,000 Units by mouth 2 (two) times a week. Tuesdays and Thurdays 05/30/12   [provider]    Physical Exam: BP (!) 116/52   Pulse 94   Temp 98.4 F (36.9 C) (Oral)   Resp 18   Ht 5\' 2"  (1.575 m)   Wt 82.1 kg (181 lb)    SpO2 100%   BMI 33.11 kg/m   General: Elderly Caucasian female. Awake and alert and oriented x3. No acute cardiopulmonary distress.  HEENT: Normocephalic atraumatic.  Right and left ears normal in appearance.  Pupils equal, round, reactive to light. Extraocular muscles are intact. Sclerae anicteric and noninjected.  Moist mucosal membranes. No mucosal lesions.  Neck: Neck supple without lymphadenopathy. No carotid bruits. No masses palpated.  Cardiovascular: Regular rate with normal S1-S2 sounds. No murmurs, rubs, gallops auscultated.  Mildly increased JVD.  3+ pitting edema from mid thigh and distal. Respiratory: Good respiratory effort.  Occasional slight wheeze.  Rales in the bases. No accessory muscle use. Abdomen: Soft, nontender, nondistended. Active bowel sounds. No masses or hepatosplenomegaly  Skin: Mild erythema to the left medial thigh.  No other rashes, lesions, or ulcerations.  Dry, warm to touch. 2+ dorsalis pedis and radial pulses. Musculoskeletal: All major joints not erythematous nontender.  No upper or lower joint deformation.  Good ROM.  No contractures  Psychiatric: Intact judgment and insight. Pleasant and cooperative. Neurologic: No focal neurological deficits. Strength is 5/5 and symmetric in upper and lower extremities.  Cranial nerves II through XII are grossly intact.           Labs on Admission: I have personally reviewed following labs and imaging studies  CBC: Recent Labs  Lab 01/15/17 1159 01/19/17 1514 01/21/17 1343  WBC 5.0 9.2 4.1  NEUTROABS 3.5  --   --   HGB 9.9* 9.2* 9.3*  HCT 31.1* 27.5* 27.7*  MCV 99.4 99.3 97.2  PLT 187 186 536   Basic Metabolic Panel: Recent Labs  Lab 01/15/17 1159 01/19/17 1514 01/21/17 1343  NA 134* 131* 126*  K 4.1 3.9 4.0  CL 99* 99* 97*  CO2 25 21* 19*  GLUCOSE 243* 212* 189*  BUN 19 33* 34*  CREATININE 1.68* 2.64* 2.71*  CALCIUM 9.2 8.8* 8.3*   GFR: Estimated Creatinine Clearance: 15.9 mL/min (A) (by C-G  formula based on SCr of 2.71 mg/dL (H)). Liver Function Tests: Recent Labs  Lab 01/15/17 1159  AST 21  ALT 8*  ALKPHOS 68  BILITOT 0.6  PROT 5.8*  ALBUMIN 2.8*   No results for input(s): LIPASE, AMYLASE in the last 168 hours. No results for input(s): AMMONIA in the last 168 hours. Coagulation Profile: No results for input(s): INR, PROTIME  in the last 168 hours. Cardiac Enzymes: Recent Labs  Lab 01/21/17 1343  TROPONINI <0.03   BNP (last 3 results) No results for input(s): PROBNP in the last 8760 hours. HbA1C: No results for input(s): HGBA1C in the last 72 hours. CBG: Recent Labs  Lab 01/19/17 1649  GLUCAP 209*   Lipid Profile: No results for input(s): CHOL, HDL, LDLCALC, TRIG, CHOLHDL, LDLDIRECT in the last 72 hours. Thyroid Function Tests: No results for input(s): TSH, T4TOTAL, FREET4, T3FREE, THYROIDAB in the last 72 hours. Anemia Panel: No results for input(s): VITAMINB12, FOLATE, FERRITIN, TIBC, IRON, RETICCTPCT in the last 72 hours. Urine analysis:    Component Value Date/Time   COLORURINE AMBER (A) 01/19/2017 1509   APPEARANCEUR CLOUDY (A) 01/19/2017 1509   LABSPEC 1.018 01/19/2017 1509   PHURINE 5.0 01/19/2017 1509   GLUCOSEU NEGATIVE 01/19/2017 1509   HGBUR NEGATIVE 01/19/2017 1509   BILIRUBINUR NEGATIVE 01/19/2017 1509   KETONESUR NEGATIVE 01/19/2017 1509   PROTEINUR 30 (A) 01/19/2017 1509   UROBILINOGEN 0.2 11/05/2014 2000   NITRITE NEGATIVE 01/19/2017 1509   LEUKOCYTESUR MODERATE (A) 01/19/2017 1509   Sepsis Labs: @LABRCNTIP (procalcitonin:4,lacticidven:4) ) Recent Results (from the past 240 hour(s))  Urine Culture     Status: Abnormal   Collection Time: 01/15/17  2:41 PM  Result Value Ref Range Status   Specimen Description URINE, CLEAN CATCH  Final   Special Requests NONE  Final   Culture >=100,000 COLONIES/mL KLEBSIELLA PNEUMONIAE (A)  Final   Report Status 01/18/2017 FINAL  Final   Organism ID, Bacteria KLEBSIELLA PNEUMONIAE (A)  Final       Susceptibility   Klebsiella pneumoniae - MIC*    AMPICILLIN >=32 RESISTANT Resistant     CEFAZOLIN <=4 SENSITIVE Sensitive     CEFTRIAXONE <=1 SENSITIVE Sensitive     CIPROFLOXACIN <=0.25 SENSITIVE Sensitive     GENTAMICIN <=1 SENSITIVE Sensitive     IMIPENEM <=0.25 SENSITIVE Sensitive     NITROFURANTOIN 64 INTERMEDIATE Intermediate     TRIMETH/SULFA <=20 SENSITIVE Sensitive     AMPICILLIN/SULBACTAM 8 SENSITIVE Sensitive     PIP/TAZO <=4 SENSITIVE Sensitive     Extended ESBL NEGATIVE Sensitive     * >=100,000 COLONIES/mL KLEBSIELLA PNEUMONIAE     Radiological Exams on Admission: Ct Abdomen Pelvis Wo Contrast  Result Date: 01/19/2017 CLINICAL DATA:  Metastatic right upper tract urothelial carcinoma. Dizziness. EXAM: CT ABDOMEN AND PELVIS WITHOUT CONTRAST TECHNIQUE: Multidetector CT imaging of the abdomen and pelvis was performed following the standard protocol without IV contrast. COMPARISON:  12/23/2016 CT chest, abdomen and pelvis. FINDINGS: Lower chest: Hypoventilatory changes at the dependent lung bases. Coronary atherosclerosis. Hepatobiliary: Finely irregular liver surface, compatible with cirrhosis. Subtle hypodense 1.9 x 1.2 cm segment 4B left liver lobe lesion (series 2/image 20), previously 1.4 x 0.9 cm, mildly increased. No additional liver lesions. Normal gallbladder with no radiopaque cholelithiasis. No biliary ductal dilatation. Pancreas: Normal, with no mass or duct dilation. Spleen: Normal size. No mass. Adrenals/Urinary Tract: Normal adrenals. Right ureteropelvic junction 4.6 x 3.7 cm mass (series 2/image 35), previously 4.3 x 3.6 cm, mildly increased. Stable asymmetric right renal atrophy with stable tumoral dilatation of the right renal collecting system. Right lumbar ureter 2.5 x 1.7 cm mass (series 2/image 43), previously 2.3 x 1.4 cm, mildly increased. No left hydronephrosis. No contour deforming left renal mass. No left renal stones. Bladder is obscured by streak artifact  from the right hip hardware. Tiny foci of gas in the nondependent bladder, presumably due to recent  bladder instrumentation. No definite bladder wall thickening. Stomach/Bowel: Small hiatal hernia. Otherwise normal nondistended stomach. Normal caliber small bowel with no small bowel wall thickening. Normal appendix. Normal large bowel with no diverticulosis, large bowel wall thickening or pericolonic fat stranding. Vascular/Lymphatic: Atherosclerotic nonaneurysmal abdominal aorta. No pathologically enlarged lymph nodes in the abdomen or pelvis. Reproductive: Grossly normal uterus.  No adnexal mass. Other: No pneumoperitoneum, ascites or focal fluid collection. Musculoskeletal: No aggressive appearing focal osseous lesions. Right total hip arthroplasty. Stable moderate L2 vertebral compression fracture. Marked thoracolumbar spondylosis. IMPRESSION: 1. Dominant right UPJ tumor has increased in size. 2. Separate right lumbar ureter tumor has also increased in size. 3. Solitary segment 4B left liver lobe metastasis appears mildly increased in size. 4. No new sites of metastatic disease in the abdomen or pelvis. 5. Chronic findings include: Aortic Atherosclerosis (ICD10-I70.0). Coronary atherosclerosis. Hepatic cirrhosis. Small hiatal hernia. Electronically Signed   By: Ilona Sorrel M.D.   On: 01/19/2017 21:47   Dg Chest Portable 1 View  Result Date: 01/21/2017 CLINICAL DATA:  Shortness of breath and wheezing EXAM: PORTABLE CHEST 1 VIEW COMPARISON:  04/27/2016 FINDINGS: Right IJ power port catheter tip lower SVC level. Marked cardiomegaly with mild central edema pattern, suspicious for volume overload or early CHF. Basilar atelectasis noted. No large effusion or pneumothorax. Upper lobes remain clear. Trachea is midline. Aorta is atherosclerotic. Bones are osteopenic. Degenerative changes of the spine and shoulders. IMPRESSION: Cardiomegaly with mild central edema as above. Basilar atelectasis Thoracic aortic  atherosclerosis Electronically Signed   By: Jerilynn Mages.  Shick M.D.   On: 01/21/2017 13:57    EKG: Independently reviewed.  Sinus rhythm.  Left atrial enlargement.  PVC.  Nonspecific intraventricular conduction delay.  Assessment/Plan: Principal Problem:   Pulmonary edema Active Problems:   Acute kidney injury superimposed on chronic kidney disease (HCC)   Hyponatremia   Type 2 diabetes with nephropathy (HCC)   Essential hypertension   Absolute anemia   Hypothyroidism   Urothelial cancer (HCC)   Ischemic cardiomyopathy   Pain and swelling of left lower leg    This patient was discussed with the ED physician, including pertinent vitals, physical exam findings, labs, and imaging.  We also discussed care given by the ED provider.  Additionally, I personally reviewed old records including the last records from the emergency room visits, oncology notes, laboratory data and imaging reports over the past several months.  1.  Pulmonary edema Telemetry monitoring Strict I/O Daily Weights Diuresis: Lasix 40 mg IV twice daily Potassium: 20 mEq twice a day by mouth Echo cardiac exam tomorrow Repeat BMP tomorrow 2.  Acute kidney injury on chronic kidney disease  Last CT scan 2 days ago showed no apparent obstruction (no hydronephrosis)  Check creatinine tomorrow  Question related to atrophic right kidney versus CHF 3.  Hyponatremia  Likely secondary to fluid overload state  Recheck sodium tomorrow 4.  Type 2 diabetes  With hyperglycemia  Sliding scale insulin 5.  UTI  Patient had a urine culture a week ago and has never been adequately treated  Start Rocephin 6.  Pain and swelling of left leg  Question DVT versus peripheral edema from CHF  Additionally, the patient does have low serum albumin/protein -likely has low oncotic pressure, which would increase her peripheral edema  We will have nutrition consult 7.  Anemia  Stable 8.  Hypertension  Hold amlodipine at the  present  Continue beta-blocker with parameters 9.  Ischemic cardiomyopathy  Continue baby aspirin 10.  Urothelial  cancer    DVT prophylaxis: Lovenox Consultants: None Code Status: Full code Family Communication: Daughter present during interview and exam Disposition Plan: Patient to go home following admission   Sentoria Brent Moores [facility] Pager [phone]  If 7PM-7AM, please contact night-coverage [email or web address] Password [email or web address]

## 2017-01-21 NOTE — ED Provider Notes (Addendum)
[facility] EMERGENCY DEPARTMENT Provider Note   CSN: [id number] Arrival date & time: 01/21/17  1241     History   Chief Complaint Chief Complaint  Patient presents with  . Shortness of Breath    HPI Joann Hunter is a 84 y.o. female.  HPI Patient presents to the emergency room for evaluation of shortness of breath.  Patient has a history of urothelial cancer with metastases to the liver.  She also has history of coronary artery disease and congestive heart failure.  Patient presents to the emergency room with complaints of dizziness and shortness of breath.  She has been seen several times over the last couple of weeks for dizziness and weakness.  Previously she was hydrated with IV fluids and she was released.  The patient has not had any focal neurologic deficits but has felt lightheaded when standing.  Patient was noted to have worsening renal insufficiency at the last ED visit 2 days ago.  Plans were her to follow-up with her oncologist and urologist.  The had a CT scan that did not show any acute findings.  She had a urinalysis on the 13th that was positive for Klebsiella pneumonia.  Today the patient woke up feeling weak and short of breath.  Family noted that she was wheezing.  They also felt that she seemed a little bit confused this morning.  Any chest pain.  No vomiting or diarrhea.  No fevers. Past Medical History:  Diagnosis Date  . Arthritis   . Back pain, chronic   . CAD (coronary artery disease)    NSTEMI 04/2011 with newly diagnosed 3V CAD s/p PCI/DES mLAD 04/11/11 with residual dz (per Dr. Burt Knack, should have consideration of PCI of the occluded RCA based on symptoms and/or consideration of outpatient nuclear perfusion testing after the patient recovers from her infarct)  . Cancer ([place])    kidney ( Nov.2017)  . Chronic kidney disease (CKD), stage IV (severe) ([place]) 12/13/2013  . Hyperglycemia   . Hyperlipidemia   . Hypertension   . Hypothyroidism   . Ischemic  cardiomyopathy    EF 45% by cath 04/20/11, 50-55% by echo 04/22/11. hypotension limiting medication titration   . Myocardial infarction ([place]) 2013  . Type 2 diabetes with nephropathy ([place]) 07/26/2014  . UTI (urinary tract infection) 12/13/2013    Patient Active Problem List   Diagnosis Date Noted  . Hypokalemia 06/16/2016  . Hip fracture, unspecified laterality, closed, initial encounter ([place]) 04/28/2016  . Ischemic cardiomyopathy 04/28/2016  . Hyperlipidemia 04/28/2016  . Cancer ([place]) 04/28/2016  . Hip fracture ([place]) 04/28/2016  . Dehydration 04/14/2016  . Orthostasis 02/24/2016  . Acute encephalopathy 02/24/2016  . Confusion 02/21/2016  . Neutropenia ([place]) 02/20/2016  . Thrombocytopenia ([place]) 02/20/2016  . ESBL (extended spectrum beta-lactamase) producing bacteria infection 02/20/2016  . Goals of care, counseling/discussion 02/02/2016  . Urothelial cancer ([place]) 01/01/2016  . Rectal bleeding 11/05/2014  . Absolute anemia 11/05/2014  . Hypothyroidism 11/05/2014  . Metabolic encephalopathy 54/27/0623  . Type 2 diabetes with nephropathy ([place]) 07/26/2014  . Essential hypertension 07/26/2014  . Hyponatremia 07/25/2014  . Vertigo 07/25/2014  . AKI (acute kidney injury) ([place]) 12/13/2013  . Fall 12/13/2013  . Lower urinary tract infectious disease 12/13/2013  . Chronic kidney disease (CKD), stage IV (severe) ([place]) 12/13/2013  . Chest pain at rest 04/26/2011  . Syncope, near 04/26/2011  . CAD (coronary artery disease) 04/23/2011  . Cardiomyopathy, ischemic 04/23/2011  . Dyslipidemia 04/23/2011  . Myocardial infarction, anterior wall,  initial care [facility]) 04/20/2011    Past Surgical History:  Procedure Laterality Date  . BACK SURGERY    . BREAST LUMPECTOMY     right  . CORONARY STENT PLACEMENT    . HIP ARTHROPLASTY Right 04/29/2016   Procedure: ARTHROPLASTY BIPOLAR HIP (HEMIARTHROPLASTY);  Surgeon: Carole Civil, MD;  Location: [facility];  Service: Orthopedics;  Laterality:  Right;  . IR FLUORO GUIDE PORT INSERTION RIGHT  01/13/2017  . IR US GUIDE VASC ACCESS RIGHT  01/13/2017  . LEFT HEART CATHETERIZATION WITH CORONARY ANGIOGRAM N/A 04/20/2011   Procedure: LEFT HEART CATHETERIZATION WITH CORONARY ANGIOGRAM;  Surgeon: Burnell Blanks, MD;  Location: [facility] CATH LAB;  Service: Cardiovascular;  Laterality: N/A;  . PERCUTANEOUS CORONARY STENT INTERVENTION (PCI-S) N/A 04/21/2011   Procedure: PERCUTANEOUS CORONARY STENT INTERVENTION (PCI-S);  Surgeon: Sherren Mocha, MD;  Location: [facility] CATH LAB;  Service: Cardiovascular;  Laterality: N/A;    OB History    Gravida Para Term Preterm AB Living   2 2 2     2    SAB TAB Ectopic Multiple Live Births                   Home Medications    Prior to Admission medications   Medication Sig Start Date End Date Taking? Authorizing Provider  amLODipine (NORVASC) 5 MG tablet Take 1 tablet (5 mg total) by mouth daily. 05/03/16   Elgergawy, Silver Huguenin, MD  aspirin EC 81 MG tablet Take 81 mg by mouth daily.    [provider]  cephALEXin (KEFLEX) 500 MG capsule Take 1 capsule (500 mg total) by mouth 2 (two) times daily. 01/19/17   Varney Biles, MD  docusate sodium (COLACE) 100 MG capsule Take 1 capsule (100 mg total) by mouth 2 (two) times daily. Patient taking differently: Take 100 mg by mouth every other day.  11/06/14   Kathie Dike, MD  feeding supplement, ENSURE ENLIVE, (ENSURE ENLIVE) LIQD Take 237 mLs by mouth 2 (two) times daily between meals. 05/02/16   Elgergawy, Silver Huguenin, MD  fish oil-omega-3 fatty acids 1000 MG capsule Take 1 g by mouth daily.    [provider]  Gemcitabine HCl (GEMZAR IV) Inject into the vein. Day 1,8,15 every 28 days    [provider]  HYDROcodone-acetaminophen (NORCO/VICODIN) 5-325 MG tablet Take 1 tablet by mouth every 6 (six) hours as needed for moderate pain. 06/03/16   Carole Civil, MD  levothyroxine (SYNTHROID, LEVOTHROID) 112 MCG tablet Take 1 tablet (112 mcg  total) by mouth daily before breakfast. 09/08/16   Holley Bouche, NP  lidocaine-prilocaine (EMLA) cream Apply a quarter size amount to affected area 1 hour prior to coming to chemotherapy.  Do not rub in.  Cover with plastic wrap. 01/09/17   Jacquelin Hawking, NP  meclizine (ANTIVERT) 25 MG tablet Take 25 mg by mouth 4 (four) times daily as needed. Dizziness    [provider]  metoprolol tartrate (LOPRESSOR) 25 MG tablet Take 1 tablet (25 mg total) by mouth 2 (two) times daily. Patient taking differently: Take 12.5 mg by mouth 2 (two) times daily.  05/02/16   Elgergawy, Silver Huguenin, MD  nitroGLYCERIN (NITROSTAT) 0.4 MG SL tablet Place 0.4 mg under the tongue every 5 (five) minutes x 3 doses as needed. For chest pain. 04/23/11   Dunn, Nedra Hai, PA-C  pantoprazole (PROTONIX) 40 MG tablet Take 1 tablet (40 mg total) by mouth daily. 11/06/14   Kathie Dike, MD  prochlorperazine (COMPAZINE)  10 MG tablet Take 1 tablet (10 mg total) by mouth every 6 (six) hours as needed for nausea or vomiting. 01/29/16   [place], Kelby Fam, MD  promethazine (PHENERGAN) 25 MG tablet Take 25 mg by mouth every 6 (six) hours as needed for nausea or vomiting.    [provider]  Vitamin D, Ergocalciferol, (DRISDOL) 50000 UNITS CAPS Take 50,000 Units by mouth 2 (two) times a week. Tuesdays and Thurdays 05/30/12   [provider]    Family History Family History  Problem Relation Age of Onset  . Other Mother   . Cancer Father   . Other Unknown        no known family cardiac disease  . Other Brother        Diptheria  . Depression Sister   . Pneumonia Sister   . Other Brother        murdered    Social History Social History   Tobacco Use  . Smoking status: Never Smoker  . Smokeless tobacco: Never Used  Substance Use Topics  . Alcohol use: No  . Drug use: No     Allergies   Ciprofloxacin; Nitrofurantoin; Lipitor [atorvastatin]; and Penicillins   Review of Systems Review of Systems    All other systems reviewed and are negative.    Physical Exam Updated Vital Signs BP (!) 111/51   Pulse 94   Temp 98.4 F (36.9 C) (Oral)   Resp 17   Ht 1.575 m (5\' 2" )   Wt 82.1 kg (181 lb)   SpO2 100%   BMI 33.11 kg/m   Physical Exam  Constitutional: No distress.  Elderly, frail  HENT:  Head: Normocephalic and atraumatic.  Right Ear: External ear normal.  Left Ear: External ear normal.  Eyes: Conjunctivae are normal. Right eye exhibits no discharge. Left eye exhibits no discharge. No scleral icterus.  Neck: Neck supple. No tracheal deviation present.  Cardiovascular: Normal rate, regular rhythm and intact distal pulses.  Pulmonary/Chest: Effort normal. No stridor. No respiratory distress. She has wheezes. She has no rales.  Abdominal: Soft. Bowel sounds are normal. She exhibits no distension, no ascites and no mass. There is no tenderness. There is no rebound and no guarding.  Musculoskeletal: She exhibits no tenderness.       Right lower leg: She exhibits edema.       Left lower leg: She exhibits edema.  Tense pitting edema of the ankles and lower extremities bilaterally  Neurological: She is alert. She has normal strength. No cranial nerve deficit (no facial droop, extraocular movements intact, no slurred speech) or sensory deficit. She exhibits normal muscle tone. She displays no seizure activity. Coordination normal.  Skin: Skin is warm and dry. No rash noted.  Psychiatric: She has a normal mood and affect.  Nursing note and vitals reviewed.    ED Treatments / Results  Labs (all labs ordered are listed, but only abnormal results are displayed) Labs Reviewed  BASIC METABOLIC PANEL - Abnormal; Notable for the following components:      Result Value   Sodium 126 (*)    Chloride 97 (*)    CO2 19 (*)    Glucose, Bld 189 (*)    BUN 34 (*)    Creatinine, Ser 2.71 (*)    Calcium 8.3 (*)    GFR calc non Af Amer 15 (*)    GFR calc Af Amer 18 (*)    All other  components within normal limits  CBC -  Abnormal; Notable for the following components:   RBC 2.85 (*)    Hemoglobin 9.3 (*)    HCT 27.7 (*)    All other components within normal limits  BRAIN NATRIURETIC PEPTIDE - Abnormal; Notable for the following components:   B Natriuretic Peptide 209.0 (*)    All other components within normal limits  TROPONIN I    EKG  EKG Interpretation  Date/Time:  Saturday January 21 2017 12:46:05 EST Ventricular Rate:  84 PR Interval:    QRS Duration: 123 QT Interval:  406 QTC Calculation: 469 R Axis:   51 Text Interpretation:  Sinus rhythm Ventricular premature complex Probable left atrial enlargement Nonspecific intraventricular conduction delay Nonspecific T abnormalities, lateral leads Baseline wander in lead(s) II III aVF pvc new since last tracing Poor data quality Confirmed by Dorie Rank [id number]) on 01/21/2017 1:03:54 PM       Radiology Ct Abdomen Pelvis Wo Contrast  Result Date: 01/19/2017 CLINICAL DATA:  Metastatic right upper tract urothelial carcinoma. Dizziness. EXAM: CT ABDOMEN AND PELVIS WITHOUT CONTRAST TECHNIQUE: Multidetector CT imaging of the abdomen and pelvis was performed following the standard protocol without IV contrast. COMPARISON:  12/23/2016 CT chest, abdomen and pelvis. FINDINGS: Lower chest: Hypoventilatory changes at the dependent lung bases. Coronary atherosclerosis. Hepatobiliary: Finely irregular liver surface, compatible with cirrhosis. Subtle hypodense 1.9 x 1.2 cm segment 4B left liver lobe lesion (series 2/image 20), previously 1.4 x 0.9 cm, mildly increased. No additional liver lesions. Normal gallbladder with no radiopaque cholelithiasis. No biliary ductal dilatation. Pancreas: Normal, with no mass or duct dilation. Spleen: Normal size. No mass. Adrenals/Urinary Tract: Normal adrenals. Right ureteropelvic junction 4.6 x 3.7 cm mass (series 2/image 35), previously 4.3 x 3.6 cm, mildly increased. Stable asymmetric right renal  atrophy with stable tumoral dilatation of the right renal collecting system. Right lumbar ureter 2.5 x 1.7 cm mass (series 2/image 43), previously 2.3 x 1.4 cm, mildly increased. No left hydronephrosis. No contour deforming left renal mass. No left renal stones. Bladder is obscured by streak artifact from the right hip hardware. Tiny foci of gas in the nondependent bladder, presumably due to recent bladder instrumentation. No definite bladder wall thickening. Stomach/Bowel: Small hiatal hernia. Otherwise normal nondistended stomach. Normal caliber small bowel with no small bowel wall thickening. Normal appendix. Normal large bowel with no diverticulosis, large bowel wall thickening or pericolonic fat stranding. Vascular/Lymphatic: Atherosclerotic nonaneurysmal abdominal aorta. No pathologically enlarged lymph nodes in the abdomen or pelvis. Reproductive: Grossly normal uterus.  No adnexal mass. Other: No pneumoperitoneum, ascites or focal fluid collection. Musculoskeletal: No aggressive appearing focal osseous lesions. Right total hip arthroplasty. Stable moderate L2 vertebral compression fracture. Marked thoracolumbar spondylosis. IMPRESSION: 1. Dominant right UPJ tumor has increased in size. 2. Separate right lumbar ureter tumor has also increased in size. 3. Solitary segment 4B left liver lobe metastasis appears mildly increased in size. 4. No new sites of metastatic disease in the abdomen or pelvis. 5. Chronic findings include: Aortic Atherosclerosis (ICD10-I70.0). Coronary atherosclerosis. Hepatic cirrhosis. Small hiatal hernia. Electronically Signed   By: Ilona Sorrel M.D.   On: 01/19/2017 21:47   Dg Chest Portable 1 View  Result Date: 01/21/2017 CLINICAL DATA:  Shortness of breath and wheezing EXAM: PORTABLE CHEST 1 VIEW COMPARISON:  04/27/2016 FINDINGS: Right IJ power port catheter tip lower SVC level. Marked cardiomegaly with mild central edema pattern, suspicious for volume overload or early CHF.  Basilar atelectasis noted. No large effusion or pneumothorax. Upper lobes remain clear. Trachea is midline.  Aorta is atherosclerotic. Bones are osteopenic. Degenerative changes of the spine and shoulders. IMPRESSION: Cardiomegaly with mild central edema as above. Basilar atelectasis Thoracic aortic atherosclerosis Electronically Signed   By: Jerilynn Mages.  Shick M.D.   On: 01/21/2017 13:57    Procedures Procedures (including critical care time)  Medications Ordered in ED Medications  albuterol (PROVENTIL) (2.5 MG/3ML) 0.083% nebulizer solution 5 mg (5 mg Nebulization Given 01/21/17 1303)  ipratropium-albuterol (DUONEB) 0.5-2.5 (3) MG/3ML nebulizer solution 3 mL (3 mLs Nebulization Given 01/21/17 1310)  furosemide (LASIX) injection 40 mg (40 mg Intravenous Given 01/21/17 1454)     Initial Impression / Assessment and Plan / ED Course  I have reviewed the triage vital signs and the nursing notes.  Pertinent labs & imaging results that were available during my care of the patient were reviewed by me and considered in my medical decision making (see chart for details).   Patient presented to the emergency room with complaints of increasing weakness and shortness of breath.  Patient is undergoing chemotherapy.  Her evaluation is notable for significant peripheral edema.  Her laboratory tests show worsening hyponatremia as well as acute kidney injury.  My suspicion is this may be related to her known malignancy and possibly her chemotherapy.  Patient's chest x-ray also suggest congestive heart failure and the patient did present with wheezing.  I suspect she has a component of volume overload.  I have ordered a dose of diuretics but the patient will need careful treatment/monitoring considering her electrolyte abnormalities.  I will consult the medical service for admission and further treatment.  Final Clinical Impressions(s) / ED Diagnoses   Final diagnoses:  Hyponatremia  AKI (acute kidney injury) ([facility])    Acute congestive heart failure, unspecified heart failure type [facility])     Dorie Rank, MD 01/21/17 1515   Also discussed the recent uti dx.  Pt is taking keflex.  She will need to continue that. May benefit from a renal US to evaluate for possible obstruction although no signs of that on CT scan 2 days ago.   Dorie Rank, MD 01/21/17 1524

## 2017-01-21 NOTE — ED Notes (Signed)
Transported to [place].

## 2017-01-22 ENCOUNTER — Inpatient Hospital Stay (HOSPITAL_COMMUNITY): Payer: Medicare Other

## 2017-01-22 DIAGNOSIS — I509 Heart failure, unspecified: Secondary | ICD-10-CM

## 2017-01-22 LAB — ECHOCARDIOGRAM COMPLETE
CHL CUP DOP CALC LVOT VTI: 22.8 cm
CHL CUP MV DEC (S): 324
CHL CUP STROKE VOLUME: 27 mL
E decel time: 324 msec
EERAT: 15.89
FS: 42 % (ref 28–44)
Height: 62 in
IV/PV OW: 1.14
LA ID, A-P, ES: 26 mm
LA vol A4C: 22.4 ml
LADIAMINDEX: 1.33 cm/m2
LAVOL: 24.6 mL
LAVOLIN: 12.6 mL/m2
LEFT ATRIUM END SYS DIAM: 26 mm
LV SIMPSON'S DISK: 64
LV dias vol index: 21 mL/m2
LV dias vol: 42 mL — AB (ref 46–106)
LV sys vol: 15 mL
LVEEAVG: 15.89
LVEEMED: 15.89
LVELAT: 6.2 cm/s
LVOT SV: 79 mL
LVOT area: 3.46 cm2
LVOT diameter: 21 mm
LVOT peak grad rest: 4 mmHg
LVOT peak vel: 106 cm/s
LVSYSVOLIN: 8 mL/m2
MV Peak grad: 4 mmHg
MVPKAVEL: 129 m/s
MVPKEVEL: 98.5 m/s
PW: 11.8 mm — AB (ref 0.6–1.1)
TAPSE: 20 mm
TDI e' lateral: 6.2
TDI e' medial: 5.22
Weight: 2966.51 oz

## 2017-01-22 LAB — GLUCOSE, CAPILLARY
GLUCOSE-CAPILLARY: 189 mg/dL — AB (ref 65–99)
Glucose-Capillary: 132 mg/dL — ABNORMAL HIGH (ref 65–99)
Glucose-Capillary: 180 mg/dL — ABNORMAL HIGH (ref 65–99)
Glucose-Capillary: 214 mg/dL — ABNORMAL HIGH (ref 65–99)

## 2017-01-22 LAB — BASIC METABOLIC PANEL
Anion gap: 11 (ref 5–15)
BUN: 33 mg/dL — ABNORMAL HIGH (ref 6–20)
CALCIUM: 8.3 mg/dL — AB (ref 8.9–10.3)
CO2: 21 mmol/L — AB (ref 22–32)
CREATININE: 2.37 mg/dL — AB (ref 0.44–1.00)
Chloride: 96 mmol/L — ABNORMAL LOW (ref 101–111)
GFR calc non Af Amer: 18 mL/min — ABNORMAL LOW (ref >60)
GFR, EST AFRICAN AMERICAN: 21 mL/min — AB (ref >60)
Glucose, Bld: 140 mg/dL — ABNORMAL HIGH (ref 65–99)
Potassium: 4 mmol/L (ref 3.5–5.1)
Sodium: 128 mmol/L — ABNORMAL LOW (ref 135–145)

## 2017-01-22 MED ORDER — ORAL CARE MOUTH RINSE
15.0000 mL | Freq: Two times a day (BID) | OROMUCOSAL | Status: DC
  Administered 2017-01-22 – 2017-01-25 (×6): 15 mL via OROMUCOSAL

## 2017-01-22 NOTE — Progress Notes (Signed)
*  PRELIMINARY RESULTS* Echocardiogram 2D Echocardiogram has been performed.  Samuel Germany 01/22/2017, 10:58 AM

## 2017-01-22 NOTE — Progress Notes (Signed)
PROGRESS NOTE    Joann Hunter  IDP:[id number] DOB: [birth year] DOA: 01/21/2017 PCP: Monico Blitz, MD   Brief Narrative:   This is an 84 year old female with history of stage IV CKD, ischemic cardiomyopathy with EF 45% on Cardiac catheterization 04/2011, type 2 diabetes, and urothelial cancer stage IV with liver metastasis-undergoing chemotherapy who presented to the emergency department 3 times in the past week for intermittent vertigo symptoms and was also noted to have a UTI for which she was prescribed Keflex.  She presented yesterday with 3-4 days of worsening dyspnea on exertion.  She is also noted to have worsening lower extremity edema and difficulty laying flat.  She was admitted with pulmonary edema, AK I, hyponatremia, and UTI.  She has been started on Lasix 40 mg IV twice daily for diuresis and an echocardiogram has been performed today.  She has also been started on Rocephin for UTI.  A left lower extremity Doppler has been performed and is negative for DVT.  Assessment & Plan:   Principal Problem:   Pulmonary edema Active Problems:   Acute kidney injury superimposed on chronic kidney disease (HCC)   Hyponatremia   Type 2 diabetes with nephropathy (HCC)   Essential hypertension   Absolute anemia   Hypothyroidism   Urothelial cancer (HCC)   Ischemic cardiomyopathy   Pain and swelling of left lower leg   1. Dyspnea secondary to acute pulmonary edema.  Continue diuresis with Lasix 40 mg IV daily and monitor strict I's and O's and daily weights.  2D echocardiogram pending for further evaluation. 2. AK I on CKD stage IV (2.71->2.37)-improving.  No hydronephrosis noted on recent CT scan.  Continue diuresis for now.  Avoid nephrotoxic agents. 3. Hyponatremia likely secondary to hypervolemia improving (now to 128 from 126).  Continue to diurese and monitor with repeat labs. 4. UTI with Klebsiella pneumonia.  Continue on Rocephin which organism was sensitive to on culture obtained  on 01/15/2017. 5. Type 2 diabetes.  Improved control on sliding scale insulin which will be continued.  Continue to monitor. 6. Hypertension.  Stable, continue to monitor. 7. Anemia.  Stable. 8. Ischemic cardiomyopathy.  Continue aspirin   DVT prophylaxis: Lovenox Code Status: Full Family Communication: Daughter at bedside Disposition Plan: Home when stable   Consultants:   None  Procedures:   2D echo pending  Antimicrobials:   Rocephin 1/19->   Subjective: Patient seen and evaluated today with no new acute complaints or concerns. No acute concerns or events noted overnight.  Objective: Vitals:   01/21/17 2100 01/22/17 0256 01/22/17 0629 01/22/17 0806  BP: (!) 126/53  (!) 128/44 (!) 135/54  Pulse: 97  91 89  Resp: 18  18 17   Temp: 98.3 F (36.8 C)  99 F (37.2 C) 99.2 F (37.3 C)  TempSrc: Oral  Oral Oral  SpO2: 99% 94% 98% 97%  Weight:   84.1 kg (185 lb 6.5 oz)   Height:        Intake/Output Summary (Last 24 hours) at 01/22/2017 1200 Last data filed at 01/22/2017 0600 Gross per 24 hour  Intake 120 ml  Output 1800 ml  Net -1680 ml   Filed Weights   01/21/17 1247 01/21/17 1650 01/22/17 0629  Weight: 82.1 kg (181 lb) 85.5 kg (188 lb 7.9 oz) 84.1 kg (185 lb 6.5 oz)    Examination:  General exam: Appears calm and comfortable  Respiratory system: Clear to auscultation. Respiratory effort normal. Cardiovascular system: S1 & S2 heard, RRR. No JVD,  murmurs, rubs, gallops or clicks. No pedal edema. Gastrointestinal system: Abdomen is nondistended, soft and nontender. No organomegaly or masses felt. Normal bowel sounds heard. Central nervous system: Alert and oriented. No focal neurological deficits. Extremities: Symmetric 5 x 5 power. Skin: No rashes, lesions or ulcers Psychiatry: Judgement and insight appear normal. Mood & affect appropriate.     Data Reviewed: I have personally reviewed following labs and imaging studies  CBC: Recent Labs  Lab  01/19/17 1514 01/21/17 1343  WBC 9.2 4.1  HGB 9.2* 9.3*  HCT 27.5* 27.7*  MCV 99.3 97.2  PLT 186 510   Basic Metabolic Panel: Recent Labs  Lab 01/19/17 1514 01/21/17 1343 01/22/17 0511  NA 131* 126* 128*  K 3.9 4.0 4.0  CL 99* 97* 96*  CO2 21* 19* 21*  GLUCOSE 212* 189* 140*  BUN 33* 34* 33*  CREATININE 2.64* 2.71* 2.37*  CALCIUM 8.8* 8.3* 8.3*   GFR: Estimated Creatinine Clearance: 18.4 mL/min (A) (by C-G formula based on SCr of 2.37 mg/dL (H)). Liver Function Tests: No results for input(s): AST, ALT, ALKPHOS, BILITOT, PROT, ALBUMIN in the last 168 hours. No results for input(s): LIPASE, AMYLASE in the last 168 hours. No results for input(s): AMMONIA in the last 168 hours. Coagulation Profile: No results for input(s): INR, PROTIME in the last 168 hours. Cardiac Enzymes: Recent Labs  Lab 01/21/17 1343  TROPONINI <0.03   BNP (last 3 results) No results for input(s): PROBNP in the last 8760 hours. HbA1C: No results for input(s): HGBA1C in the last 72 hours. CBG: Recent Labs  Lab 01/19/17 1649 01/21/17 1657 01/21/17 2031 01/22/17 0801 01/22/17 1111  GLUCAP 209* 172* 171* 132* 189*   Lipid Profile: No results for input(s): CHOL, HDL, LDLCALC, TRIG, CHOLHDL, LDLDIRECT in the last 72 hours. Thyroid Function Tests: No results for input(s): TSH, T4TOTAL, FREET4, T3FREE, THYROIDAB in the last 72 hours. Anemia Panel: No results for input(s): VITAMINB12, FOLATE, FERRITIN, TIBC, IRON, RETICCTPCT in the last 72 hours. Sepsis Labs: No results for input(s): PROCALCITON, LATICACIDVEN in the last 168 hours.  Recent Results (from the past 240 hour(s))  Urine Culture     Status: Abnormal   Collection Time: 01/15/17  2:41 PM  Result Value Ref Range Status   Specimen Description URINE, CLEAN CATCH  Final   Special Requests NONE  Final   Culture >=100,000 COLONIES/mL KLEBSIELLA PNEUMONIAE (A)  Final   Report Status 01/18/2017 FINAL  Final   Organism ID, Bacteria  KLEBSIELLA PNEUMONIAE (A)  Final      Susceptibility   Klebsiella pneumoniae - MIC*    AMPICILLIN >=32 RESISTANT Resistant     CEFAZOLIN <=4 SENSITIVE Sensitive     CEFTRIAXONE <=1 SENSITIVE Sensitive     CIPROFLOXACIN <=0.25 SENSITIVE Sensitive     GENTAMICIN <=1 SENSITIVE Sensitive     IMIPENEM <=0.25 SENSITIVE Sensitive     NITROFURANTOIN 64 INTERMEDIATE Intermediate     TRIMETH/SULFA <=20 SENSITIVE Sensitive     AMPICILLIN/SULBACTAM 8 SENSITIVE Sensitive     PIP/TAZO <=4 SENSITIVE Sensitive     Extended ESBL NEGATIVE Sensitive     * >=100,000 COLONIES/mL KLEBSIELLA PNEUMONIAE         Radiology Studies: US Venous Img Lower Unilateral Left  Result Date: 01/21/2017 CLINICAL DATA:  Left lower extremity pain and swelling. Shortness of breath. EXAM: LEFT LOWER EXTREMITY VENOUS DOPPLER ULTRASOUND TECHNIQUE: Gray-scale sonography with graded compression, as well as color Doppler and duplex ultrasound were performed to evaluate the lower extremity deep  venous systems from the level of the common femoral vein and including the common femoral, femoral, profunda femoral, popliteal and calf veins including the posterior tibial, peroneal and gastrocnemius veins when visible. The superficial great saphenous vein was also interrogated. Spectral Doppler was utilized to evaluate flow at rest and with distal augmentation maneuvers in the common femoral, femoral and popliteal veins. COMPARISON:  None. FINDINGS: Contralateral Common Femoral Vein: Respiratory phasicity is normal and symmetric with the symptomatic side. No evidence of thrombus. Normal compressibility. Common Femoral Vein: No evidence of thrombus. Normal compressibility, respiratory phasicity and response to augmentation. Saphenofemoral Junction: No evidence of thrombus. Normal compressibility and flow on color Doppler imaging. Profunda Femoral Vein: No evidence of thrombus. Normal compressibility and flow on color Doppler imaging. Femoral Vein:  No evidence of thrombus. Normal compressibility, respiratory phasicity and response to augmentation. Popliteal Vein: No evidence of thrombus. Normal compressibility, respiratory phasicity and response to augmentation. Calf Veins: No evidence of thrombus. Normal compressibility and flow on color Doppler imaging. Superficial Great Saphenous Vein: No evidence of thrombus. Normal compressibility. Venous Reflux:  None. Other Findings:  None. IMPRESSION: No evidence of deep venous thrombosis. Electronically Signed   By: Dorise Bullion III M.D   On: 01/21/2017 17:00   Dg Chest Portable 1 View  Result Date: 01/21/2017 CLINICAL DATA:  Shortness of breath and wheezing EXAM: PORTABLE CHEST 1 VIEW COMPARISON:  04/27/2016 FINDINGS: Right IJ power port catheter tip lower SVC level. Marked cardiomegaly with mild central edema pattern, suspicious for volume overload or early CHF. Basilar atelectasis noted. No large effusion or pneumothorax. Upper lobes remain clear. Trachea is midline. Aorta is atherosclerotic. Bones are osteopenic. Degenerative changes of the spine and shoulders. IMPRESSION: Cardiomegaly with mild central edema as above. Basilar atelectasis Thoracic aortic atherosclerosis Electronically Signed   By: Jerilynn Mages.  Shick M.D.   On: 01/21/2017 13:57        Scheduled Meds: . aspirin EC  81 mg Oral Daily  . docusate sodium  100 mg Oral QODAY  . enoxaparin (LOVENOX) injection  30 mg Subcutaneous Q24H  . feeding supplement (ENSURE ENLIVE)  237 mL Oral BID BM  . furosemide  40 mg Intravenous BID  . insulin aspart  0-15 Units Subcutaneous TID WC  . insulin aspart  0-5 Units Subcutaneous QHS  . levothyroxine  112 mcg Oral QAC breakfast  . mouth rinse  15 mL Mouth Rinse BID  . metoprolol tartrate  12.5 mg Oral BID  . pantoprazole  40 mg Oral Daily  . potassium chloride  20 mEq Oral BID  . sodium chloride flush  3 mL Intravenous Q12H   Continuous Infusions: . sodium chloride    . cefTRIAXone (ROCEPHIN)  IV  Stopped (01/21/17 1755)     LOS: 1 day    Time spent: 30 minutes    Eileene Kisling Darleen Crocker, DO [facility] Pager [phone]  If 7PM-7AM, please contact night-coverage [email or web address] Password [email or web address] 01/22/2017, 12:00 PM

## 2017-01-23 ENCOUNTER — Other Ambulatory Visit (HOSPITAL_COMMUNITY): Payer: Medicare Other

## 2017-01-23 ENCOUNTER — Ambulatory Visit (HOSPITAL_COMMUNITY): Payer: Medicare Other

## 2017-01-23 DIAGNOSIS — N170 Acute kidney failure with tubular necrosis: Secondary | ICD-10-CM

## 2017-01-23 DIAGNOSIS — L8990 Pressure ulcer of unspecified site, unspecified stage: Secondary | ICD-10-CM

## 2017-01-23 DIAGNOSIS — N184 Chronic kidney disease, stage 4 (severe): Secondary | ICD-10-CM

## 2017-01-23 DIAGNOSIS — I5033 Acute on chronic diastolic (congestive) heart failure: Secondary | ICD-10-CM

## 2017-01-23 LAB — GLUCOSE, CAPILLARY
GLUCOSE-CAPILLARY: 221 mg/dL — AB (ref 65–99)
GLUCOSE-CAPILLARY: 223 mg/dL — AB (ref 65–99)
Glucose-Capillary: 157 mg/dL — ABNORMAL HIGH (ref 65–99)
Glucose-Capillary: 180 mg/dL — ABNORMAL HIGH (ref 65–99)

## 2017-01-23 LAB — BASIC METABOLIC PANEL
ANION GAP: 8 (ref 5–15)
BUN: 28 mg/dL — AB (ref 6–20)
CO2: 25 mmol/L (ref 22–32)
Calcium: 8.4 mg/dL — ABNORMAL LOW (ref 8.9–10.3)
Chloride: 95 mmol/L — ABNORMAL LOW (ref 101–111)
Creatinine, Ser: 2.17 mg/dL — ABNORMAL HIGH (ref 0.44–1.00)
GFR calc Af Amer: 23 mL/min — ABNORMAL LOW (ref >60)
GFR calc non Af Amer: 20 mL/min — ABNORMAL LOW (ref >60)
Glucose, Bld: 168 mg/dL — ABNORMAL HIGH (ref 65–99)
POTASSIUM: 4.1 mmol/L (ref 3.5–5.1)
Sodium: 128 mmol/L — ABNORMAL LOW (ref 135–145)

## 2017-01-23 LAB — CBC
HEMATOCRIT: 24 % — AB (ref 36.0–46.0)
HEMOGLOBIN: 7.9 g/dL — AB (ref 12.0–15.0)
MCH: 31.6 pg (ref 26.0–34.0)
MCHC: 32.9 g/dL (ref 30.0–36.0)
MCV: 96 fL (ref 78.0–100.0)
Platelets: 121 10*3/uL — ABNORMAL LOW (ref 150–400)
RBC: 2.5 MIL/uL — ABNORMAL LOW (ref 3.87–5.11)
RDW: 12 % (ref 11.5–15.5)
WBC: 3.7 10*3/uL — ABNORMAL LOW (ref 4.0–10.5)

## 2017-01-23 NOTE — Progress Notes (Signed)
PROGRESS NOTE                                                                                                                                                                                                             Patient Demographics:    Joann Hunter, is a 84 y.o. female, DOB - [birth year], WVP:[id number]  Admit date - 01/21/2017   Admitting Physician Truett Mainland, DO  Outpatient Primary MD for the patient is Monico Blitz, MD  LOS - 2  Outpatient Specialists: oncology at [facility]  Chief Complaint  Patient presents with  . 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 She returned back to the ED with worsening shortness of breath with increasing lower extremity edema and orthopnea. Patient admitted for acute on chronic kidney disease, acute pulmonary edema, UTI and hyponatremia. Patient diuresing well on IV Lasix with improvement in her hyponatremia.   Subjective:    Patient feels her breathing to be better but still feels dizzy on trying to get up. Still not back to her baseline.   Assessment  & Plan :    Principal Problem:   Acute Pulmonary edema Acute on chronic diastolic CHF (HCC) Diuresing well with IV Lasix (negative balance 4.3 L). Strict I/O and daily weight. 2-D echo with normal EF of 55-60% with grade 1 diastolic dysfunction. Continue aspirin and metoprolol.   Active Problems:   Acute kidney injury superimposed on chronic kidney disease stage IV (HCC) Likely cardiorenal. Improving with IV Lasix. Avoid nephrotoxins. Continue to monitor.  Hypervolemic hyponatremia Secondary to volume overload.  Improving with IV Lasix.  UTI with klebsiella pneumonia Urine culture from 1/13. Seemed medically treated as outpatient and getting IV Rocephin since 1/19.       Type 2 diabetes with nephropathy (HCC) Monitor on writing scale coverage.    Hypothyroidism Continue Synthroid.    Urothelial cancer ([place]) As liver metastases. Getting chemotherapy as outpatient.     Pressure injury of skin Care per nursing.  generalized weakness with dizziness. PT evaluation. Patient prefers going home with  home health than SNF and if needed.    Code Status : Full code  Family Communication  : At her bedside  Disposition Plan  : Possibly home tomorrow if improved. Pending PT evaluation.  Barriers For Discharge : Active symptoms  Consults  :  None  Procedures  : 2-D echo  DVT Prophylaxis  : Heparin  Lab Results  Component Value Date   PLT 121 (L) 01/23/2017    Antibiotics  :    Anti-infectives (From admission, onward)   Start     Dose/Rate Route Frequency Ordered Stop   01/21/17 1700  cefTRIAXone (ROCEPHIN) 1 g in dextrose 5 % 50 mL IVPB     1 g 100 mL/hr over 30 Minutes Intravenous Every 24 hours 01/21/17 1600          Objective:   Vitals:   01/22/17 0629 01/22/17 0806 01/22/17 2129 01/23/17 0500  BP: (!) 128/44 (!) 135/54 (!) 153/43 (!) 131/50  Pulse: 91 89 95 83  Resp: 18 17 16 16   Temp: 99 F (37.2 C) 99.2 F (37.3 C) 98.6 F (37 C) 98.5 F (36.9 C)  TempSrc: Oral Oral Oral Oral  SpO2: 98% 97% 99% 96%  Weight: 84.1 kg (185 lb 6.5 oz)   81.4 kg (179 lb 7.3 oz)  Height:        Wt Readings from Last 3 Encounters:  01/23/17 81.4 kg (179 lb 7.3 oz)  01/19/17 82.1 kg (181 lb)  01/16/17 82.2 kg (181 lb 3.2 oz)     Intake/Output Summary (Last 24 hours) at 01/23/2017 0925 Last data filed at 01/23/2017 0600 Gross per 24 hour  Intake 710 ml  Output 2000 ml  Net -1290 ml     Physical Exam  Gen: not in distress, fatigued HEENT: Pallor present, moist mucosa,  supple neck Chest: clear b/l, no added sounds, right-sided port CVS: N S1&S2, no murmurs, rubs or gallop GI: soft, NT, ND, BS+ Musculoskeletal: warm, 1+ pitting edema bilaterally     Data Review:    CBC Recent Labs  Lab 01/19/17 1514 01/21/17 1343 01/23/17 0509  WBC 9.2 4.1 3.7*  HGB 9.2* 9.3* 7.9*  HCT 27.5* 27.7* 24.0*  PLT 186 160 121*  MCV 99.3 97.2 96.0  MCH 33.2 32.6 31.6  MCHC 33.5 33.6 32.9  RDW 13.4 13.0 12.0    Chemistries  Recent Labs  Lab 01/19/17 1514 01/21/17 1343 01/22/17 0511 01/23/17 0509  NA 131* 126* 128* 128*  K 3.9 4.0 4.0 4.1  CL 99* 97* 96* 95*  CO2 21* 19* 21* 25  GLUCOSE 212* 189* 140* 168*  BUN 33* 34* 33* 28*  CREATININE 2.64* 2.71* 2.37* 2.17*  CALCIUM 8.8* 8.3* 8.3* 8.4*   ------------------------------------------------------------------------------------------------------------------ No results for input(s): CHOL, HDL, LDLCALC, TRIG, CHOLHDL, LDLDIRECT in the last 72 hours.  Lab Results  Component Value Date   HGBA1C 6.3 (H) 02/24/2016   ------------------------------------------------------------------------------------------------------------------ No results for input(s): TSH, T4TOTAL, T3FREE, THYROIDAB in the last 72 hours.  Invalid input(s): FREET3 ------------------------------------------------------------------------------------------------------------------ No results for input(s): VITAMINB12, FOLATE, FERRITIN, TIBC, IRON, RETICCTPCT in the last 72 hours.  Coagulation profile No results for input(s): INR, PROTIME in the last 168 hours.  No results for input(s): DDIMER in the last 72 hours.  Cardiac Enzymes Recent Labs  Lab 01/21/17 1343  TROPONINI <0.03   ------------------------------------------------------------------------------------------------------------------    Component Value Date/Time   BNP 209.0 (H) 01/21/2017 1343    Inpatient Medications  Scheduled Meds: . aspirin EC  81 mg  Oral Daily    . docusate sodium  100 mg Oral QODAY  . enoxaparin (LOVENOX) injection  30 mg Subcutaneous Q24H  . feeding supplement (ENSURE ENLIVE)  237 mL Oral BID BM  . furosemide  40 mg Intravenous BID  . insulin aspart  0-15 Units Subcutaneous TID WC  . insulin aspart  0-5 Units Subcutaneous QHS  . levothyroxine  112 mcg Oral QAC breakfast  . mouth rinse  15 mL Mouth Rinse BID  . metoprolol tartrate  12.5 mg Oral BID  . pantoprazole  40 mg Oral Daily  . potassium chloride  20 mEq Oral BID  . sodium chloride flush  3 mL Intravenous Q12H   Continuous Infusions: . sodium chloride    . cefTRIAXone (ROCEPHIN)  IV Stopped (01/22/17 1851)   PRN Meds:.sodium chloride, acetaminophen, HYDROcodone-acetaminophen, meclizine, ondansetron (ZOFRAN) IV, prochlorperazine, promethazine, sodium chloride flush  Micro Results Recent Results (from the past 240 hour(s))  Urine Culture     Status: Abnormal   Collection Time: 01/15/17  2:41 PM  Result Value Ref Range Status   Specimen Description URINE, CLEAN CATCH  Final   Special Requests NONE  Final   Culture >=100,000 COLONIES/mL KLEBSIELLA PNEUMONIAE (A)  Final   Report Status 01/18/2017 FINAL  Final   Organism ID, Bacteria KLEBSIELLA PNEUMONIAE (A)  Final      Susceptibility   Klebsiella pneumoniae - MIC*    AMPICILLIN >=32 RESISTANT Resistant     CEFAZOLIN <=4 SENSITIVE Sensitive     CEFTRIAXONE <=1 SENSITIVE Sensitive     CIPROFLOXACIN <=0.25 SENSITIVE Sensitive     GENTAMICIN <=1 SENSITIVE Sensitive     IMIPENEM <=0.25 SENSITIVE Sensitive     NITROFURANTOIN 64 INTERMEDIATE Intermediate     TRIMETH/SULFA <=20 SENSITIVE Sensitive     AMPICILLIN/SULBACTAM 8 SENSITIVE Sensitive     PIP/TAZO <=4 SENSITIVE Sensitive     Extended ESBL NEGATIVE Sensitive     * >=100,000 COLONIES/mL KLEBSIELLA PNEUMONIAE    Radiology Reports Ct Abdomen Pelvis Wo Contrast  Result Date: 01/19/2017 CLINICAL DATA:  Metastatic right upper tract urothelial carcinoma.  Dizziness. EXAM: CT ABDOMEN AND PELVIS WITHOUT CONTRAST TECHNIQUE: Multidetector CT imaging of the abdomen and pelvis was performed following the standard protocol without IV contrast. COMPARISON:  12/23/2016 CT chest, abdomen and pelvis. FINDINGS: Lower chest: Hypoventilatory changes at the dependent lung bases. Coronary atherosclerosis. Hepatobiliary: Finely irregular liver surface, compatible with cirrhosis. Subtle hypodense 1.9 x 1.2 cm segment 4B left liver lobe lesion (series 2/image 20), previously 1.4 x 0.9 cm, mildly increased. No additional liver lesions. Normal gallbladder with no radiopaque cholelithiasis. No biliary ductal dilatation. Pancreas: Normal, with no mass or duct dilation. Spleen: Normal size. No mass. Adrenals/Urinary Tract: Normal adrenals. Right ureteropelvic junction 4.6 x 3.7 cm mass (series 2/image 35), previously 4.3 x 3.6 cm, mildly increased. Stable asymmetric right renal atrophy with stable tumoral dilatation of the right renal collecting system. Right lumbar ureter 2.5 x 1.7 cm mass (series 2/image 43), previously 2.3 x 1.4 cm, mildly increased. No left hydronephrosis. No contour deforming left renal mass. No left renal stones. Bladder is obscured by streak artifact from the right hip hardware. Tiny foci of gas in the nondependent bladder, presumably due to recent bladder instrumentation. No definite bladder wall thickening. Stomach/Bowel: Small hiatal hernia. Otherwise normal nondistended stomach. Normal caliber small bowel with no small bowel wall thickening. Normal appendix. Normal large bowel with no diverticulosis, large bowel wall thickening or pericolonic fat stranding. Vascular/Lymphatic: Atherosclerotic nonaneurysmal abdominal aorta.  No pathologically enlarged lymph nodes in the abdomen or pelvis. Reproductive: Grossly normal uterus.  No adnexal mass. Other: No pneumoperitoneum, ascites or focal fluid collection. Musculoskeletal: No aggressive appearing focal osseous  lesions. Right total hip arthroplasty. Stable moderate L2 vertebral compression fracture. Marked thoracolumbar spondylosis. IMPRESSION: 1. Dominant right UPJ tumor has increased in size. 2. Separate right lumbar ureter tumor has also increased in size. 3. Solitary segment 4B left liver lobe metastasis appears mildly increased in size. 4. No new sites of metastatic disease in the abdomen or pelvis. 5. Chronic findings include: Aortic Atherosclerosis (ICD10-I70.0). Coronary atherosclerosis. Hepatic cirrhosis. Small hiatal hernia. Electronically Signed   By: Ilona Sorrel M.D.   On: 01/19/2017 21:47   Ct Head Wo Contrast  Result Date: 01/15/2017 CLINICAL DATA:  Nausea and dizziness for the past month. The dizziness is increasing. EXAM: CT HEAD WITHOUT CONTRAST TECHNIQUE: Contiguous axial images were obtained from the base of the skull through the vertex without intravenous contrast. COMPARISON:  01/04/2017. FINDINGS: Brain: Diffusely enlarged ventricles and subarachnoid spaces. Patchy white matter low density in both cerebral hemispheres. No intracranial hemorrhage, mass lesion or CT evidence of acute infarction. Vascular: No hyperdense vessel or unexpected calcification. Skull: Mild bilateral hyperostosis frontalis. Sinuses/Orbits: Unremarkable. Other: None. IMPRESSION: 1. No acute abnormality. 2. Stable atrophy and chronic small vessel white matter ischemic changes. Electronically Signed   By: Claudie Revering M.D.   On: 01/15/2017 13:57   Ct Head Wo Contrast  Result Date: 01/04/2017 CLINICAL DATA:  C/O DIZZINESS TODAY, PATIENT IS CURRENTLY BEING TREATED FOR UTI WITH ANITBX, HX STAGE 4 UROTHELIAL CA WITH LIVER METS AND ONGOING CHEMO. HX DM, HTN, CKD EXAM: CT HEAD WITHOUT CONTRAST TECHNIQUE: Contiguous axial images were obtained from the base of the skull through the vertex without intravenous contrast. COMPARISON:  Head CT dated 04/27/2016. FINDINGS: Brain: Again noted is generalized age related parenchymal atrophy  with commensurate dilatation of the ventricles and sulci. Chronic small vessel ischemic changes again noted within the bilateral periventricular and subcortical white matter. Small old lacunar infarct again noted within the right basal ganglia. There is no mass, hemorrhage, edema or other evidence of acute parenchymal abnormality. No extra-axial hemorrhage. Vascular: There are chronic calcified atherosclerotic changes of the large vessels at the skull base. No unexpected hyperdense vessel. Skull: Normal. Negative for fracture or focal lesion. Sinuses/Orbits: No acute finding. Other: None. IMPRESSION: 1. No acute findings. No intracranial mass, hemorrhage or edema. No evidence of intracranial metastasis. 2. Atrophy and chronic ischemic changes, as detailed above. Electronically Signed   By: Franki Cabot M.D.   On: 01/04/2017 13:53   US Venous Img Lower Unilateral Left  Result Date: 01/21/2017 CLINICAL DATA:  Left lower extremity pain and swelling. Shortness of breath. EXAM: LEFT LOWER EXTREMITY VENOUS DOPPLER ULTRASOUND TECHNIQUE: Gray-scale sonography with graded compression, as well as color Doppler and duplex ultrasound were performed to evaluate the lower extremity deep venous systems from the level of the common femoral vein and including the common femoral, femoral, profunda femoral, popliteal and calf veins including the posterior tibial, peroneal and gastrocnemius veins when visible. The superficial great saphenous vein was also interrogated. Spectral Doppler was utilized to evaluate flow at rest and with distal augmentation maneuvers in the common femoral, femoral and popliteal veins. COMPARISON:  None. FINDINGS: Contralateral Common Femoral Vein: Respiratory phasicity is normal and symmetric with the symptomatic side. No evidence of thrombus. Normal compressibility. Common Femoral Vein: No evidence of thrombus. Normal compressibility, respiratory phasicity and response to  augmentation. Saphenofemoral  Junction: No evidence of thrombus. Normal compressibility and flow on color Doppler imaging. Profunda Femoral Vein: No evidence of thrombus. Normal compressibility and flow on color Doppler imaging. Femoral Vein: No evidence of thrombus. Normal compressibility, respiratory phasicity and response to augmentation. Popliteal Vein: No evidence of thrombus. Normal compressibility, respiratory phasicity and response to augmentation. Calf Veins: No evidence of thrombus. Normal compressibility and flow on color Doppler imaging. Superficial Great Saphenous Vein: No evidence of thrombus. Normal compressibility. Venous Reflux:  None. Other Findings:  None. IMPRESSION: No evidence of deep venous thrombosis. Electronically Signed   By: Dorise Bullion III M.D   On: 01/21/2017 17:00   Ir US Guide Vasc Access Right  Result Date: 01/13/2017 INDICATION: 84 year old female with metastatic urothelial carcinoma. She requires durable IV access for chemotherapy. She presents for port catheter placement. EXAM: IMPLANTED PORT A CATH PLACEMENT WITH ULTRASOUND AND FLUOROSCOPIC GUIDANCE MEDICATIONS: 1 g vancomycin; The antibiotic was administered within an appropriate time interval prior to skin puncture. ANESTHESIA/SEDATION: Versed 1.5 mg IV; Fentanyl 75 mcg IV; Moderate Sedation Time:  15 minutes The patient was continuously monitored during the procedure by the interventional radiology nurse under my direct supervision. FLUOROSCOPY TIME:  0 minutes, 30 seconds (1 mGy) COMPLICATIONS: None immediate. PROCEDURE: The right neck and chest was prepped with chlorhexidine, and draped in the usual sterile fashion using maximum barrier technique (cap and mask, sterile gown, sterile gloves, large sterile sheet, hand hygiene and cutaneous antiseptic). Antibiotic prophylaxis was provided with 1g vancomycin administered IV one hour prior to skin incision. Local anesthesia was attained by infiltration with 1% lidocaine with epinephrine. Ultrasound  demonstrated patency of the right internal jugular vein, and this was documented with an image. Under real-time ultrasound guidance, this vein was accessed with a 21 gauge micropuncture needle and image documentation was performed. A small dermatotomy was made at the access site with an 11 scalpel. A 0.018" wire was advanced into the SVC and the access needle exchanged for a 5F micropuncture vascular sheath. The 0.018" wire was then removed and a 0.035" wire advanced into the IVC. An appropriate location for the subcutaneous reservoir was selected below the clavicle and an incision was made through the skin and underlying soft tissues. The subcutaneous tissues were then dissected using a combination of blunt and sharp surgical technique and a pocket was formed. A single lumen power injectable portacatheter was then tunneled through the subcutaneous tissues from the pocket to the dermatotomy and the port reservoir placed within the subcutaneous pocket. The venous access site was then serially dilated and a peel away vascular sheath placed over the wire. The wire was removed and the port catheter advanced into position under fluoroscopic guidance. The catheter tip is positioned in the upper right atrium. This was documented with a spot image. The portacatheter was then tested and found to flush and aspirate well. The port was flushed with saline followed by 100 units/mL heparinized saline. The pocket was then closed in two layers using first subdermal inverted interrupted absorbable sutures followed by a running subcuticular suture. The epidermis was then sealed with Dermabond. The dermatotomy at the venous access site was also closed with a single inverted subdermal suture and the epidermis sealed with Dermabond. IMPRESSION: Successful placement of a right IJ approach Power Port with ultrasound and fluoroscopic guidance. The catheter is ready for use. Signed, Criselda Peaches, MD [facility] Radiology Electronically Signed   By: Dellis Filbert.D.  On: 01/13/2017 14:36   Dg Chest Portable 1 View  Result Date: 01/21/2017 CLINICAL DATA:  Shortness of breath and wheezing EXAM: PORTABLE CHEST 1 VIEW COMPARISON:  04/27/2016 FINDINGS: Right IJ power port catheter tip lower SVC level. Marked cardiomegaly with mild central edema pattern, suspicious for volume overload or early CHF. Basilar atelectasis noted. No large effusion or pneumothorax. Upper lobes remain clear. Trachea is midline. Aorta is atherosclerotic. Bones are osteopenic. Degenerative changes of the spine and shoulders. IMPRESSION: Cardiomegaly with mild central edema as above. Basilar atelectasis Thoracic aortic atherosclerosis Electronically Signed   By: Jerilynn Mages.  Shick M.D.   On: 01/21/2017 13:57   Dg Hip Unilat With Pelvis 2-3 Views Right  Result Date: 01/08/2017 CLINICAL DATA:  Fall with right hip pain. EXAM: DG HIP (WITH OR WITHOUT PELVIS) 2-3V RIGHT COMPARISON:  Pelvis film from 04/29/2016. CT scout film from 09/06/2016. FINDINGS: Frontal pelvis shows diffuse bony demineralization. SI joints and symphysis pubis unremarkable. Degenerative changes noted in the left hip. AP and frog-leg lateral views of the right hip show the patient to be status post hemiarthroplasty. No evidence for periprosthetic fracture. Position of the femoral head component is similar to the CT scout image taking into account differential positioning. IMPRESSION: No acute bony abnormality. Electronically Signed   By: Misty Stanley M.D.   On: 01/08/2017 19:05   Ir Fluoro Guide Port Insertion Right  Result Date: 01/13/2017 INDICATION: 84 year old female with metastatic urothelial carcinoma. She requires durable IV access for chemotherapy. She presents for port catheter placement. EXAM: IMPLANTED PORT A CATH PLACEMENT WITH ULTRASOUND AND FLUOROSCOPIC GUIDANCE MEDICATIONS: 1 g vancomycin; The antibiotic was administered within an  appropriate time interval prior to skin puncture. ANESTHESIA/SEDATION: Versed 1.5 mg IV; Fentanyl 75 mcg IV; Moderate Sedation Time:  15 minutes The patient was continuously monitored during the procedure by the interventional radiology nurse under my direct supervision. FLUOROSCOPY TIME:  0 minutes, 30 seconds (1 mGy) COMPLICATIONS: None immediate. PROCEDURE: The right neck and chest was prepped with chlorhexidine, and draped in the usual sterile fashion using maximum barrier technique (cap and mask, sterile gown, sterile gloves, large sterile sheet, hand hygiene and cutaneous antiseptic). Antibiotic prophylaxis was provided with 1g vancomycin administered IV one hour prior to skin incision. Local anesthesia was attained by infiltration with 1% lidocaine with epinephrine. Ultrasound demonstrated patency of the right internal jugular vein, and this was documented with an image. Under real-time ultrasound guidance, this vein was accessed with a 21 gauge micropuncture needle and image documentation was performed. A small dermatotomy was made at the access site with an 11 scalpel. A 0.018" wire was advanced into the SVC and the access needle exchanged for a 67F micropuncture vascular sheath. The 0.018" wire was then removed and a 0.035" wire advanced into the IVC. An appropriate location for the subcutaneous reservoir was selected below the clavicle and an incision was made through the skin and underlying soft tissues. The subcutaneous tissues were then dissected using a combination of blunt and sharp surgical technique and a pocket was formed. A single lumen power injectable portacatheter was then tunneled through the subcutaneous tissues from the pocket to the dermatotomy and the port reservoir placed within the subcutaneous pocket. The venous access site was then serially dilated and a peel away vascular sheath placed over the wire. The wire was removed and the port catheter advanced into position under fluoroscopic  guidance. The catheter tip is positioned in the upper right atrium. This was documented with a spot image.  The portacatheter was then tested and found to flush and aspirate well. The port was flushed with saline followed by 100 units/mL heparinized saline. The pocket was then closed in two layers using first subdermal inverted interrupted absorbable sutures followed by a running subcuticular suture. The epidermis was then sealed with Dermabond. The dermatotomy at the venous access site was also closed with a single inverted subdermal suture and the epidermis sealed with Dermabond. IMPRESSION: Successful placement of a right IJ approach Power Port with ultrasound and fluoroscopic guidance. The catheter is ready for use. Signed, Criselda Peaches, MD [facility] Radiology Electronically Signed   By: Jacqulynn Cadet M.D.   On: 01/13/2017 14:36    Time Spent in minutes  25   Hayden Kihara M.D on 01/23/2017 at 9:25 AM  Between 7am to 7pm - Pager - [phone]  After 7pm go to [email or web address] - password [email or web address]  [facility] -  Office  [phone]

## 2017-01-23 NOTE — Plan of Care (Signed)
  Acute Rehab PT Goals(only PT should resolve) Pt Will Go Supine/Side To Sit 01/23/2017 1558 - Progressing by Lonell Grandchild, PT Flowsheets Taken 01/23/2017 1558  Pt will go Supine/Side to Sit with min guard assist Patient Will Transfer Sit To/From Stand 01/23/2017 1558 - Progressing by Lonell Grandchild, PT Flowsheets Taken 01/23/2017 1558  Patient will transfer sit to/from stand with min guard assist Pt Will Transfer Bed To Chair/Chair To Bed 01/23/2017 1558 - Progressing by Lonell Grandchild, PT Flowsheets Taken 01/23/2017 1558  Pt will Transfer Bed to Chair/Chair to Bed min guard assist Pt Will Ambulate 01/23/2017 1558 - Progressing by Lonell Grandchild, PT Flowsheets Taken 01/23/2017 1558  Pt will Ambulate 25 feet;with min guard assist;with rolling walker  3:59 PM, 01/23/17 Lonell Grandchild, MPT Physical Therapist with [facility] [phone] office [phone] mobile phone

## 2017-01-23 NOTE — Evaluation (Signed)
Physical Therapy Evaluation Patient Details Name: Joann Hunter MRN: [id number] DOB: 1932/01/19 Today's Date: 01/23/2017   History of Present Illness  Joann Hunter is a 84 y.o. female with a history of stage IV chronic kidney disease, hypertension, hypothyroidism, ischemic cardiomyopathy (NSTEMI in 04/2011) with EF of 45% by cath on 04/2011 and 50-55% on echo 04/2011.  She also has type 2 diabetes and urothelial cancer stage IV with liver metastasis currently undergoing chemotherapy.  The patient has been to the emergency department 3 times in the past week for  vertigo symptoms which have been intermittent.  Per the EDP notes, patient was assumed to be intravascularly dry was given IV fluids both Monday and oncology and on Thursday in the emergency department.  Patient was also noted to have UTI on Thursday and was prescribed Keflex -she is only taken 1 dose, which was last night.  She presents today with 3-4 days of worsening shortness of breath, worse with with exertion and improved with rest.  Initially, her shortness of breath was intermittent and primarily with exertion, but now it is more persistent and is occurring mildly at rest.  No other palliating or provoking factors.  Difficulty laying flat.  Does note swelling of lower extremities bilaterally, which is been worsening.  She also has pain in her left medial thigh with some mild erythema.  Denies fevers, chills, nausea, vomiting.  Denies chest pain    Clinical Impression  Patient limited for functional mobility as stated below secondary to BLE weakness, fatigue and fair/poor standing balance.  Patient will benefit from continued physical therapy in hospital and recommended venue below to increase strength, balance, endurance for safe ADLs and gait.     Follow Up Recommendations Home health PT;Supervision for mobility/OOB    Equipment Recommendations  None recommended by PT    Recommendations for Other Services       Precautions /  Restrictions Precautions Precautions: Fall Restrictions Weight Bearing Restrictions: No      Mobility  Bed Mobility Overal bed mobility: Needs Assistance Bed Mobility: Supine to Sit     Supine to sit: Min assist;Mod assist        Transfers Overall transfer level: Needs assistance Equipment used: Rolling walker (2 wheeled) Transfers: Sit to/from [facility] Sit to Stand: Min assist Stand pivot transfers: Min assist          Ambulation/Gait Ambulation/Gait assistance: Min assist Ambulation Distance (Feet): 15 Feet Assistive device: Rolling walker (2 wheeled) Gait Pattern/deviations: Decreased step length - right;Decreased step length - left;Decreased stride length   Gait velocity interpretation: Below normal speed for age/gender General Gait Details: demonstrates slightly labored slow cadence without loss of balance, limited secondary to c/o fatigue  Stairs            Wheelchair Mobility    Modified Rankin (Stroke Patients Only)       Balance Overall balance assessment: Needs assistance Sitting-balance support: Feet supported;Bilateral upper extremity supported Sitting balance-Leahy Scale: Good     Standing balance support: Bilateral upper extremity supported;During functional activity Standing balance-Leahy Scale: Fair                               Pertinent Vitals/Pain Pain Assessment: No/denies pain    Home Living Family/patient expects to be discharged to:: Private residence Living Arrangements: Children(daughter and grandson) Available Help at Discharge: Family Type of Home: House Home Access: Level entry  Home Layout: One level Home Equipment: Walker - 2 wheels;Bedside commode;Shower seat;Wheelchair - manual;Hospital bed      Prior Function Level of Independence: Needs assistance   Gait / Transfers Assistance Needed: household ambulation with RW  ADL's / Homemaking Assistance Needed: assisted by  family        Hand Dominance        Extremity/Trunk Assessment   Upper Extremity Assessment Upper Extremity Assessment: Generalized weakness    Lower Extremity Assessment Lower Extremity Assessment: Generalized weakness    Cervical / Trunk Assessment Cervical / Trunk Assessment: Normal  Communication   Communication: HOH;No difficulties  Cognition Arousal/Alertness: Awake/alert Behavior During Therapy: WFL for tasks assessed/performed Overall Cognitive Status: Within Functional Limits for tasks assessed                                        General Comments      Exercises     Assessment/Plan    PT Assessment Patient needs continued PT services  PT Problem List Decreased strength;Decreased activity tolerance;Decreased mobility;Decreased balance       PT Treatment Interventions Gait training;Functional mobility training;Therapeutic activities;Therapeutic exercise;Patient/family education    PT Goals (Current goals can be found in the Care Plan section)  Acute Rehab PT Goals Patient Stated Goal: return home with family to assist PT Goal Formulation: With patient/family Time For Goal Achievement: 01/27/17 Potential to Achieve Goals: Good    Frequency Min 3X/week   Barriers to discharge        Co-evaluation               AM-PAC PT "6 Clicks" Daily Activity  Outcome Measure Difficulty turning over in bed (including adjusting bedclothes, sheets and blankets)?: A Little Difficulty moving from lying on back to sitting on the side of the bed? : A Little Difficulty sitting down on and standing up from a chair with arms (e.g., wheelchair, bedside commode, etc,.)?: A Little Help needed moving to and from a bed to chair (including a wheelchair)?: A Little Help needed walking in hospital room?: A Little Help needed climbing 3-5 steps with a railing? : A Lot 6 Click Score: 17    End of Session   Activity Tolerance: Patient tolerated  treatment well;Patient limited by fatigue Patient left: in chair;with call bell/phone within reach;with family/visitor present Nurse Communication: Mobility status PT Visit Diagnosis: Unsteadiness on feet (R26.81);Other abnormalities of gait and mobility (R26.89);Muscle weakness (generalized) (M62.81)    Time: 1308-6578 PT Time Calculation (min) (ACUTE ONLY): 25 min   Charges:   PT Evaluation $PT Eval Moderate Complexity: 1 Mod PT Treatments $Therapeutic Activity: 23-37 mins   PT G Codes:        3:56 PM, February 10, 2017 Lonell Grandchild, MPT Physical Therapist with [facility] [phone] office [phone] mobile phone

## 2017-01-24 ENCOUNTER — Other Ambulatory Visit (HOSPITAL_COMMUNITY): Payer: Medicare Other

## 2017-01-24 ENCOUNTER — Ambulatory Visit (HOSPITAL_COMMUNITY): Payer: Medicare Other

## 2017-01-24 DIAGNOSIS — J810 Acute pulmonary edema: Secondary | ICD-10-CM

## 2017-01-24 DIAGNOSIS — I509 Heart failure, unspecified: Secondary | ICD-10-CM

## 2017-01-24 DIAGNOSIS — D649 Anemia, unspecified: Secondary | ICD-10-CM

## 2017-01-24 LAB — BASIC METABOLIC PANEL
ANION GAP: 13 (ref 5–15)
BUN: 25 mg/dL — ABNORMAL HIGH (ref 6–20)
CHLORIDE: 91 mmol/L — AB (ref 101–111)
CO2: 27 mmol/L (ref 22–32)
CREATININE: 2.05 mg/dL — AB (ref 0.44–1.00)
Calcium: 8.6 mg/dL — ABNORMAL LOW (ref 8.9–10.3)
GFR calc non Af Amer: 21 mL/min — ABNORMAL LOW (ref >60)
GFR, EST AFRICAN AMERICAN: 25 mL/min — AB (ref >60)
Glucose, Bld: 173 mg/dL — ABNORMAL HIGH (ref 65–99)
POTASSIUM: 4 mmol/L (ref 3.5–5.1)
SODIUM: 131 mmol/L — AB (ref 135–145)

## 2017-01-24 LAB — GLUCOSE, CAPILLARY
GLUCOSE-CAPILLARY: 161 mg/dL — AB (ref 65–99)
GLUCOSE-CAPILLARY: 186 mg/dL — AB (ref 65–99)
GLUCOSE-CAPILLARY: 152 mg/dL — AB (ref 65–99)
Glucose-Capillary: 242 mg/dL — ABNORMAL HIGH (ref 65–99)

## 2017-01-24 NOTE — Progress Notes (Signed)
PROGRESS NOTE                                                                                                                                                                                                             Patient Demographics:    Joann Hunter, is a 84 y.o. female, DOB - 02-10-1932, WYO:[id number]  Admit date - 01/21/2017   Admitting Physician Truett Mainland, DO  Outpatient Primary MD for the patient is Monico Blitz, MD  LOS - 3  Outpatient Specialists: oncology at [facility]  Chief Complaint  Patient presents with  . Shortness of Breath       Brief Narrative   This is a 84 year old female with stage IV chronic kidney disease, ischemic cardiomyopathy, type 2 diabetes mellitus and 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 She returned back to the ED with worsening shortness of breath with increasing lower extremity edema and orthopnea. Patient admitted for acute on chronic kidney disease, acute pulmonary edema, UTI and hyponatremia. Patient diuresing well on IV Lasix with improvement in her hyponatremia.   Subjective:   Patient was seen and examined this morning, reporting a much improved shortness of breath.  Denies any chest pain.  Accompanied by her daughter at bedside.    Assessment  & Plan :    Principal Problem:   Acute Pulmonary edema/ Acute on chronic diastolic CHF (HCC) - Improving, monitoring I's and O's and daily weight Diuresing well with IV Lasix (negative -980/12 hrs). Strict I/O and daily weight.  2-D echo with normal EF of 55-60% with grade 1 diastolic dysfunction.  Will Continue aspirin and metoprolol.   Active Problems:   Acute kidney injury superimposed on chronic kidney disease stage IV (HCC) Currently stable most likely  cardiorenal syndrome,, will continue IV Lasix  Avoiding any nephrotoxins Monitoring I's and O's,   Hypervolemic hyponatremia Secondary to volume overload. Improving with IV Lasix.  UTI with klebsiella pneumonia Urine culture from 1/13. Seemed medically treated as outpatient and getting IV Rocephin since 1/19.   Type 2 diabetes with nephropathy (HCC) Monitor on writing scale coverage.    Hypothyroidism Continue Synthroid.    Urothelial cancer ([place]) With multiple metastases including liver metastases.  Getting  chemotherapy as outpatient.     Pressure injury of skin -  Tinea skin care, but rotation, care per nursing.  generalized weakness with dizziness. PT evaluation. Patient prefers going home with home health than SNF and if needed.    Code Status : Full code  Family Communication  : At her bedside  Disposition Plan  : Possibly home tomorrow if improved. Pending PT evaluation.  Barriers For Discharge : Active symptoms  Consults  :  None  Procedures  : 2-D echo  DVT Prophylaxis  : Heparin  Lab Results  Component Value Date   PLT 121 (L) 01/23/2017    Antibiotics  :    Anti-infectives (From admission, onward)   Start     Dose/Rate Route Frequency Ordered Stop   01/21/17 1700  cefTRIAXone (ROCEPHIN) 1 g in dextrose 5 % 50 mL IVPB     1 g 100 mL/hr over 30 Minutes Intravenous Every 24 hours 01/21/17 1600          Objective:   Vitals:   01/23/17 2100 01/24/17 0300 01/24/17 1454 01/24/17 1519  BP: (!) 126/50 (!) 144/43 (!) 126/42   Pulse: 88 78 84 89  Resp:  16 16   Temp:  98.5 F (36.9 C) 98.9 F (37.2 C) 98 F (36.7 C)  TempSrc:  Oral Oral Oral  SpO2:  96% 96%   Weight:  78.7 kg (173 lb 6.4 oz)    Height:        Wt Readings from Last 3 Encounters:  01/24/17 78.7 kg (173 lb 6.4 oz)  01/19/17 82.1 kg (181 lb)  01/16/17 82.2 kg (181 lb 3.2 oz)     Intake/Output Summary (Last 24 hours) at 01/24/2017 1531 Last data filed at 01/24/2017  1506 Gross per 24 hour  Intake 610 ml  Output 2250 ml  Net -1640 ml   BP (!) 126/42 (BP Location: Right Arm)   Pulse 89   Temp 98 F (36.7 C) (Oral)   Resp 16   Ht 5\' 2"  (1.575 m)   Wt 78.7 kg (173 lb 6.4 oz)   SpO2 96%   BMI 31.72 kg/m    Physical Exam  Constitution:  Alert, cooperative, no distress, appears stated age HEENT: Normocephalic, PERRL, otherwise with in Normal limits  Vascular:  S1/S2, RRR, No murmure, No Rubs or Gallops  Chest/pulmonary: Clear to auscultation bilaterally, respirations unlabored  Chest symmetric Abdomen: Soft, non-tender, non-distended, bowel sounds,no masses, no organomegaly Muscular skeletal: Limited exam - in bed, able to move all 4 extremities, Normal strength,  Extremities: +1 pitting edema lower extremities, +2 pulses  Neuro: CNII-XII intact. , normal motor and sensation, reflexes intact  Skin: Dry, warm to touch, negative for any Rashes, Psychiatric: Normal and stable mood and affect, cognition intact,        Data Review:    CBC Recent Labs  Lab 01/19/17 1514 01/21/17 1343 01/23/17 0509  WBC 9.2 4.1 3.7*  HGB 9.2* 9.3* 7.9*  HCT 27.5* 27.7* 24.0*  PLT 186 160 121*  MCV 99.3 97.2 96.0  MCH 33.2 32.6 31.6  MCHC 33.5 33.6 32.9  RDW 13.4 13.0 12.0    Chemistries  Recent Labs  Lab 01/19/17 1514 01/21/17 1343 01/22/17 0511 01/23/17 0509 01/24/17 0713  NA 131* 126* 128* 128* 131*  K 3.9 4.0 4.0 4.1 4.0  CL 99* 97* 96* 95* 91*  CO2 21* 19* 21* 25 27  GLUCOSE 212* 189* 140* 168* 173*  BUN 33* 34* 33* 28*  25*  CREATININE 2.64* 2.71* 2.37* 2.17* 2.05*  CALCIUM 8.8* 8.3* 8.3* 8.4* 8.6*   ------------------------------------------------------------------------------------------------------------------ No results for input(s): CHOL, HDL, LDLCALC, TRIG, CHOLHDL, LDLDIRECT in the last 72 hours.  Lab Results  Component Value Date   HGBA1C 6.3 (H) 02/24/2016    ------------------------------------------------------------------------------------------------------------------ No results for input(s): TSH, T4TOTAL, T3FREE, THYROIDAB in the last 72 hours.  Invalid input(s): FREET3 ------------------------------------------------------------------------------------------------------------------ No results for input(s): VITAMINB12, FOLATE, FERRITIN, TIBC, IRON, RETICCTPCT in the last 72 hours.  Coagulation profile No results for input(s): INR, PROTIME in the last 168 hours.  No results for input(s): DDIMER in the last 72 hours.  Cardiac Enzymes Recent Labs  Lab 01/21/17 1343  TROPONINI <0.03   ------------------------------------------------------------------------------------------------------------------    Component Value Date/Time   BNP 209.0 (H) 01/21/2017 1343    Inpatient Medications  Scheduled Meds: . aspirin EC  81 mg Oral Daily  . docusate sodium  100 mg Oral QODAY  . enoxaparin (LOVENOX) injection  30 mg Subcutaneous Q24H  . feeding supplement (ENSURE ENLIVE)  237 mL Oral BID BM  . furosemide  40 mg Intravenous BID  . insulin aspart  0-15 Units Subcutaneous TID WC  . insulin aspart  0-5 Units Subcutaneous QHS  . levothyroxine  112 mcg Oral QAC breakfast  . mouth rinse  15 mL Mouth Rinse BID  . metoprolol tartrate  12.5 mg Oral BID  . pantoprazole  40 mg Oral Daily  . potassium chloride  20 mEq Oral BID  . sodium chloride flush  3 mL Intravenous Q12H   Continuous Infusions: . sodium chloride    . cefTRIAXone (ROCEPHIN)  IV Stopped (01/23/17 1754)   PRN Meds:.sodium chloride, acetaminophen, HYDROcodone-acetaminophen, meclizine, ondansetron (ZOFRAN) IV, prochlorperazine, promethazine, sodium chloride flush  Micro Results Recent Results (from the past 240 hour(s))  Urine Culture     Status: Abnormal   Collection Time: 01/15/17  2:41 PM  Result Value Ref Range Status   Specimen Description URINE, CLEAN CATCH  Final    Special Requests NONE  Final   Culture >=100,000 COLONIES/mL KLEBSIELLA PNEUMONIAE (A)  Final   Report Status 01/18/2017 FINAL  Final   Organism ID, Bacteria KLEBSIELLA PNEUMONIAE (A)  Final      Susceptibility   Klebsiella pneumoniae - MIC*    AMPICILLIN >=32 RESISTANT Resistant     CEFAZOLIN <=4 SENSITIVE Sensitive     CEFTRIAXONE <=1 SENSITIVE Sensitive     CIPROFLOXACIN <=0.25 SENSITIVE Sensitive     GENTAMICIN <=1 SENSITIVE Sensitive     IMIPENEM <=0.25 SENSITIVE Sensitive     NITROFURANTOIN 64 INTERMEDIATE Intermediate     TRIMETH/SULFA <=20 SENSITIVE Sensitive     AMPICILLIN/SULBACTAM 8 SENSITIVE Sensitive     PIP/TAZO <=4 SENSITIVE Sensitive     Extended ESBL NEGATIVE Sensitive     * >=100,000 COLONIES/mL KLEBSIELLA PNEUMONIAE    Radiology Reports Ct Abdomen Pelvis Wo Contrast  Result Date: 01/19/2017 CLINICAL DATA:  Metastatic right upper tract urothelial carcinoma. Dizziness. EXAM: CT ABDOMEN AND PELVIS WITHOUT CONTRAST TECHNIQUE: Multidetector CT imaging of the abdomen and pelvis was performed following the standard protocol without IV contrast. COMPARISON:  12/23/2016 CT chest, abdomen and pelvis. FINDINGS: Lower chest: Hypoventilatory changes at the dependent lung bases. Coronary atherosclerosis. Hepatobiliary: Finely irregular liver surface, compatible with cirrhosis. Subtle hypodense 1.9 x 1.2 cm segment 4B left liver lobe lesion (series 2/image 20), previously 1.4 x 0.9 cm, mildly increased. No adde right lumbar ureter tumor has also increased in size. 3. Solitary segment 4B left  liver lobe metastasis appears mildly increased in size. 4. No new sites of metastatic disease in the abdomen or pelvis. 5. Chronic findings include: Aortic Atherosclerosis (ICD10-I70.0). Coronary atherosclerosis. Hepatic cirrhosis. Small hiatal hernia. Electronically Signed   By: Ilona Sorrel M.D.   On: 01/19/2017 21:47   Ct Head Wo Contrast  Result Date: 01/15/2017 CLINICAL DATA:  Nausea and  dizziness for the past month. The dizziness is increasing. EXAM: CT HEAD WITHOUT CONTRAST TECHNIQUE: Contiguous axial images were obtained from the base of the skull through the vertex without intravenous contrast. COMPARISON:  01/04/2017. FINDINGS: Brain: Diffusely enlarged ventricles and subarachnoid spaces. Patchy white matter low density in both cerebral hemispheres. No intracranial hemorrhage, mass lesion or CT evidence of acute infarction. Vascular: No hyperdense vessel or unexpected calcification. Skull: Mild bilateral hyperostosis frontalis. Sinuses/Orbits: Unremarkable. Other: None. IMPRESSION: 1. No acute abnormality. 2. Stable atrophy and chronic small vessel white matter ischemic changes. Electronically Signed   By: Claudie Revering M.D.   On: 01/15/2017 13:57   Ct Head Wo Contrast  Result Date: 01/04/2017 CLINICAL DATA:  C/O DIZZINESS TODAY, PATIENT IS CURRENTLY BEING TREATED FOR UTI WITH ANITBX, HX STAGE 4 UROTHELIAL CA WITH LIVER METS AND ONGOING CHEMO. HX DM, HTN, CKD EXAM: CT HEAD WITHOUT CONTRAST TECHNIQUE: Contiguous axial images were obtained from the base of the skull through the vertex without intravenous contrast. COMPARISON:  Head CT dated 04/27/2016. FINDINGS: Brain: Again noted is generalized age related parenchymal atrophy with commensurate dilatation of the ventricles and sulci. Chronic small vessel ischemic changes again noted within the bilateral periventricular and subcortical white matter. Small old lacunar infarct again noted within the right basal ganglia. There is no mass, hemorrhage, edema or other evidence of acute parenchymal abnormality. No extra-axial hemorrhage. Vascular: There are chronic calcified atherosclerotic changes of the large vessels at the skull base. No unexpected hyperdense vessel. Skull: Normal. Negative for fracture or focal lesion. Sinuses/Orbits: No acute finding. Other: None. IMPRESSION: 1. No acute findings. No intracranial mass, hemorrhage or edema. No  evidence of intracranial metastasis. 2. Atrophy and chronic ischemic changes, as detailed above. Electronically Signed   By: Franki Cabot M.D.   On: 01/04/2017 13:53   US Venous Img Lower Unilateral Left  Result Date: 01/21/2017 CLINICAL DATA:  Left lower extremity pain and swelling. Shortness of breath. EXAM: LEFT LOWER EXTREMITY VENOUS DOPPLER ULTRASOUND TECHNIQUE: Gray-scale sonography with graded compression, as well as color Doppler and duplex ultrasound were performed to evaluate the lower extremity deep venous systems from the level of the common femoral vein and including the common femoral, femoral, profunda femoral, popliteal and calf veins including the posterior tibial, peroneal and gastrocnemius veins when visible. The superficial great saphenous vein was also interrogated. Spectral Doppler was utilized to evaluate flow at rest and with distal augmentation maneuvers in the common femoral, femoral and popliteal veins. COMPARISON:  None. FINDINGS: Contralateral Common Femoral Vein: Respiratory phasicity is normal and symmetric with the symptomatic side. No evidence of thrombus. Normal compressibility. Common Femoral Vein: No evidence of thrombus. Normal compressibility, respiratory phasicity and response to augmentation. Saphenofemoral Junction: No evidence of thrombus. Normal compressibility and flow on color Doppler imaging. Profunda Femoral Vein: No evidence of thrombus. Normal compressibility and flow on color Doppler imaging. Femoral Vein: No evidence of thrombus. Normal compressibility, respiratory phasicity and response to augmentation. Popliteal Vein: No evidence of thrombus. Normal compressibility, respiratory phasicity and response to augmentation. Calf Veins: No evidence of thrombus. Normal compressibility and flow on color Doppler imaging.  Superficial Great Saphenous Vein: No evidence of thrombus. Normal compressibility. Venous Reflux:  None. Other Findings:  None. IMPRESSION: No evidence  of deep venous thrombosis. Electronically Signed   By: Dorise Bullion III M.D   On: 01/21/2017 17:00   Ir US Guide Vasc Access Right  Result Date: 01/13/2017 INDICATION: 84 year old female with metastatic urothelial carcinoma. She requires durable IV access for chemotherapy. She presents for port catheter placement. EXAM: IMPLANTED PORT A CATH PLACEMENT WITH ULTRASOUND AND FLUOROSCOPIC GUIDANCE MEDICATIONS: 1 g vancomycin; The antibiotic was administered within an appropriate time interheter was then tested and found to flush and aspirate well. The port was flushed with saline followed by 100 units/mL heparinized saline. The pocket was then closed in two layers using first subdermal inverted interrupted absorbable sutures followed by a running subcuticular suture. The epidermis was then sealed with Dermabond. The dermatotomy at the venous access site was also closed with a single inverted subdermal suture and the epidermis sealed with Dermabond. IMPRESSION: Successful placement of a right IJ approach Power Port with ultrasound and fluoroscopic guidance. The catheter is ready for use. Signed, Criselda Peaches, MD [facility] Radiology Electronically Signed   By: Jacqulynn Cadet M.D.   On: 01/13/2017 14:36   Dg Chest Portable 1 View  Result Date: 01/21/2017 CLINICAL DATA:  Shortness of breath and wheezing EXAM: PORTABLE CHEST 1 VIEW COMPARISON:  04/27/2016 FINDINGS: Right IJ power port catheter tip lower SVC level. Marked cardiomegaly with mild central edema pattern, suspicious for volume overload or early CHF. Basilar atelectasis noted. No large effusion or pneumothorax. Upper lobes remain clear. Trachea is midline. Aorta is atherosclerotic. Bones are osteopenic. Degenerative changes of the spine and shoulders. IMPRESSION: Cardiomegaly with mild central edema as above. Basilar atelectasis Thoracic aortic atherosclerosis Electronically Signed   By: Jerilynn Mages.   Shick M.D.   On: 01/21/2017 13:57   Dg Hip Unilat With Pelvis 2-3 Views Right  Result Date: 01/08/2017 CLINICAL DATA:  Fall with right hip pain. EXAM: DG HIP (WITH OR WITHOUT PELVIS) 2-3V RIGHT COMPARISON:  Pelvis film from 04/29/2016. CT scout film from 09/06/2016. FINDINGS: Frontal pelvis shows diffuse bony demineralization. SI joints and symphysis pubis unremarkable. Degenerative changes noted in the left hip. AP and frog-leg lateral views of the right hip show the patient to be status post hemiarthroplasty. No evidence for periprosthetic fracture. Position of the femoral head component is similar to the CT scout image taking into account differential positioning. IMPRESSION: No acute bony abnormality. Electronically Signed   By: Misty Stanley M.D.   On: 01/08/2017 19:05   Ir Fluoro Guide Port Insertion Right  Result Date: 01/13/2017 INDICATION: 84 year old female with metastatic urothelial carcinoma. She requires durable IV access for chemotherapy. She presents for port catheter placement. EXAM: IMPLANTED PORT A CATH PLACEMENT WITH ULTRASOUND AND FLUOROSCOPIC GUIDANCE MEDICATIONS: 1 g vancomycin; The antibiotic was administered within an appropriate time interval prior to skin puncture. ANESTHESIA/SEDATION: Versed 1.5 mg IV; Fentanyl 75 mcg IV; . IMPRESSION: Successful placement of a right IJ approach Power Port with ultrasound and fluoroscopic guidance. The catheter is ready for use. Signed, Criselda Peaches, MD [facility] Radiology Electronically Signed   By: Jacqulynn Cadet M.D.   On: 01/13/2017 14:36    Time Spent in minutes  25   Deatra James M.D on 01/24/2017 at 3:31 PM  Between 7am to 7pm - Pager - [phone]  After 7pm go to [email or web address] - password [facility]  [facility] -  Office  [phone]

## 2017-01-24 NOTE — Progress Notes (Signed)
Called to room because patient had complaints of dizziness. I assessed patient, patient did not appear to be in any distress. I took the patient's vital signs, they are as follows: 98.9, 126/42, 84, 16, 96. Patient's daughter is at bedside and said that her mother gets dizzy at home when she's dehydrated. I encouraged the patient to increase her PO fluid intake. I paged the doctor to let him know about the patient's complaints. He said to make sure she increases her PO fluid intake and continue to monitor patient. I will continue to assess her.

## 2017-01-24 NOTE — Progress Notes (Signed)
Inpatient Diabetes Program Recommendations  AACE/ADA: New Consensus Statement on Inpatient Glycemic Control (2015)  Target Ranges:  Prepandial:   less than 140 mg/dL      Peak postprandial:   less than 180 mg/dL (1-2 hours)      Critically ill patients:  140 - 180 mg/dL   Results for Joann Hunter, Joann Hunter (MRN [id number]) as of 01/24/2017 08:47  Ref. Range 01/23/2017 07:41 01/23/2017 11:17 01/23/2017 16:35 01/23/2017 21:23 01/24/2017 08:00  Glucose-Capillary Latest Ref Range: 65 - 99 mg/dL 157 (H) 180 (H) 221 (H) 223 (H) 161 (H)   Review of Glycemic Control  Diabetes history: DM2 Outpatient Diabetes medications: None Current orders for Inpatient glycemic control:Novolog 0-15 units TID with meals, Novolog 0-5 units QHS  Inpatient Diabetes Program Recommendations: Insulin - Meal Coverage: Post prandial glucose is consistently elevated. Please consider ordering Novolog 3 units TID with meals for meal coverage if patient eats at least 50% of meals.  Thanks, Barnie Alderman, RN, MSN, CDE Diabetes Coordinator Inpatient Diabetes Program [phone] (Team Pager from 8am to 5pm)

## 2017-01-25 DIAGNOSIS — N179 Acute kidney failure, unspecified: Secondary | ICD-10-CM

## 2017-01-25 DIAGNOSIS — N189 Chronic kidney disease, unspecified: Secondary | ICD-10-CM

## 2017-01-25 LAB — BASIC METABOLIC PANEL
Anion gap: 12 (ref 5–15)
BUN: 23 mg/dL — ABNORMAL HIGH (ref 6–20)
CHLORIDE: 88 mmol/L — AB (ref 101–111)
CO2: 32 mmol/L (ref 22–32)
Calcium: 8.9 mg/dL (ref 8.9–10.3)
Creatinine, Ser: 1.91 mg/dL — ABNORMAL HIGH (ref 0.44–1.00)
GFR calc Af Amer: 27 mL/min — ABNORMAL LOW (ref >60)
GFR calc non Af Amer: 23 mL/min — ABNORMAL LOW (ref >60)
Glucose, Bld: 187 mg/dL — ABNORMAL HIGH (ref 65–99)
POTASSIUM: 4 mmol/L (ref 3.5–5.1)
SODIUM: 132 mmol/L — AB (ref 135–145)

## 2017-01-25 LAB — GLUCOSE, CAPILLARY
GLUCOSE-CAPILLARY: 160 mg/dL — AB (ref 65–99)
GLUCOSE-CAPILLARY: 199 mg/dL — AB (ref 65–99)

## 2017-01-25 MED ORDER — HEPARIN SOD (PORK) LOCK FLUSH 100 UNIT/ML IV SOLN
500.0000 [IU] | INTRAVENOUS | Status: AC | PRN
  Administered 2017-01-25: 500 [IU]
  Filled 2017-01-25: qty 5

## 2017-01-25 MED ORDER — TORSEMIDE 20 MG PO TABS
40.0000 mg | ORAL_TABLET | Freq: Every day | ORAL | Status: DC
  Administered 2017-01-25: 40 mg via ORAL
  Filled 2017-01-25: qty 2

## 2017-01-25 MED ORDER — LEVOFLOXACIN 500 MG PO TABS
500.0000 mg | ORAL_TABLET | ORAL | Status: DC
  Administered 2017-01-25: 500 mg via ORAL
  Filled 2017-01-25: qty 1

## 2017-01-25 MED ORDER — LEVOFLOXACIN 500 MG PO TABS: 500.0000 mg | ORAL_TABLET | ORAL | 0 refills | Status: DC

## 2017-01-25 NOTE — Care Management Important Message (Signed)
Important Message  Patient Details  Name: Joann Hunter MRN: [id number] Date of Birth: Jan 12, 1932   Medicare Important Message Given:  Yes    Sherald Barge, RN 01/25/2017, 12:13 PM

## 2017-01-25 NOTE — Progress Notes (Signed)
Patient is to be discharged home and in stable condition. Patient and family given discharge instructions and verbalized understanding. Patient will be escorted out by ataff via wheelchair.  Celestia Khat, RN

## 2017-01-25 NOTE — Discharge Summary (Signed)
Physician Discharge Summary      Patient: Joann Hunter                   Admit date: 01/21/2017   DOB: [birth year]             Discharge date:01/25/2017/12:10 PM WPY:[id number]                           PCP: Monico Blitz, MD Recommendations for Outpatient Follow-up:   1.       Is follow-up with oncologist  2. Please follow-up with your primary care physician within 1-2 weeks.   Discharge Condition: Stable  CODE STATUS:  Full code Diet recommendation:  Regular healthy diet  ----------------------------------------------------------------------------------------------------------------------  Discharge Diagnoses:   Principal Problem:   Pulmonary edema Active Problems:   Acute kidney injury superimposed on chronic kidney disease (HCC)   Hyponatremia   Type 2 diabetes with nephropathy (HCC)   Essential hypertension   Absolute anemia   Hypothyroidism   Urothelial cancer (HCC)   Ischemic cardiomyopathy   Pain and swelling of left lower leg   Pressure injury of skin   History of present illness : This is a 84 year old female with stage IV chronic kidney disease, ischemic cardiomyopathy, type 2 diabetes mellitus and 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 She returned back to the ED with worsening shortness of breath with increasing lower extremity edema and orthopnea. Patient admitted for acute on chronic kidney disease, acute pulmonary edema, UTI and hyponatremia. Patient diuresing well on IV Lasix with improvement in her hyponatremia.  Hospital course / Brief Summary:  Acute Pulmonary edema/ Acute on chronic diastolic CHF (HCC) - Improving, monitoring I's and O's and daily weight Is on IV Lasix 40 mg twice daily, was switched to torsemide 40 mg p.o. Daily 2-D echo with normal EF of 55-60% with grade 1  diastolic dysfunction.  Will Continue aspirin and metoprolol.      Acute kidney injury superimposed on chronic kidney disease stage IV (HCC) Currently stable most likely cardiorenal syndrome,,  IV Lasix has been switched to p.o. torsemide , try to avoid any other nephrotoxins occluding NSAIDs  Hypervolemic hyponatremia Secondary to volume overload. Was on 80 mg of  IV Lasix BID, we have switched her to Torsemide 40 mg p.o. Daily Hyponatremia has improved  UTI with klebsiella pneumonia Urine culture from 1/13.  was IV Rocephin since 1/19, and sensitivity, and intolerance of patient to Cipro patient has been switched to p.o. Levaquin every other day in the course   Type 2 diabetes with nephropathy (HCC) Poor p.o. intake, as of now regular diet recommended, once improved p.o. diabetic diet recommended    Hypothyroidism Continue Synthroid.    Urothelial cancer ([place]) With multiple metastases including liver metastases.  Getting chemotherapy as outpatient.     Pressure injury of skin -  Tinea skin care, but rotation, care per nursing.  generalized weakness with dizziness. PT evaluation. Patient prefers going home with home health than SNF and if needed.  Code Status : Full code  Disposition -home with home health    Consultations:   Shoulder/palliative  Procedures: No admission procedures for hospital encounter.    ----------------------------------------------------------------------------------------------------------------------  Discharge Instructions:   Discharge Instructions    (HEART FAILURE PATIENTS) Call MD:  Anytime you have any of  the following symptoms: 1) 3 pound weight gain in 24 hours or 5 pounds in 1 week 2) shortness of breath, with or without a dry hacking cough 3) swelling in the hands, feet or stomach 4) if you have to sleep on extra pillows at night in order to breathe.   Complete by:  As directed    Activity as tolerated - No  restrictions   Complete by:  As directed    Call MD for:  difficulty breathing, headache or visual disturbances   Complete by:  As directed    Call MD for:  temperature >100.4   Complete by:  As directed    Diet - low sodium heart healthy   Complete by:  As directed    Discharge instructions   Complete by:  As directed    PT/OT recommended, high risk for fall, fall precaution Follow with the PCP, continue treatments, Continue antibiotics as recommended if intolerance notify PCP for possible changing antibiotics   Increase activity slowly   Complete by:  As directed        Medication List    STOP taking these medications   cephALEXin 500 MG capsule Commonly known as:  KEFLEX   pantoprazole 40 MG tablet Commonly known as:  PROTONIX     TAKE these medications   amLODipine 5 MG tablet Commonly known as:  NORVASC Take 1 tablet (5 mg total) by mouth daily.   aspirin EC 81 MG tablet Take 81 mg by mouth daily.   docusate sodium 100 MG capsule Commonly known as:  COLACE Take 1 capsule (100 mg total) by mouth 2 (two) times daily. What changed:  when to take this   feeding supplement (ENSURE ENLIVE) Liqd Take 237 mLs by mouth 2 (two) times daily between meals.   fish oil-omega-3 fatty acids 1000 MG capsule Take 1 g by mouth daily.   GEMZAR IV Inject into the vein. Day 1,8,15 every 28 days   HYDROcodone-acetaminophen 5-325 MG tablet Commonly known as:  NORCO/VICODIN Take 1 tablet by mouth every 6 (six) hours as needed for moderate pain.   levofloxacin 500 MG tablet Commonly known as:  LEVAQUIN Take 1 tablet (500 mg total) by mouth every other day for 5 doses.   levothyroxine 112 MCG tablet Commonly known as:  SYNTHROID, LEVOTHROID Take 1 tablet (112 mcg total) by mouth daily before breakfast.   lidocaine-prilocaine cream Commonly known as:  EMLA Apply a quarter size amount to affected area 1 hour prior to coming to chemotherapy.  Do not rub in.  Cover with plastic  wrap.   meclizine 25 MG tablet Commonly known as:  ANTIVERT Take 25 mg by mouth 4 (four) times daily as needed. Dizziness   metoprolol tartrate 25 MG tablet Commonly known as:  LOPRESSOR Take 1 tablet (25 mg total) by mouth 2 (two) times daily. What changed:  how much to take   nitroGLYCERIN 0.4 MG SL tablet Commonly known as:  NITROSTAT Place 0.4 mg under the tongue every 5 (five) minutes x 3 doses as needed. For chest pain.   prochlorperazine 10 MG tablet Commonly known as:  COMPAZINE Take 1 tablet (10 mg total) by mouth every 6 (six) hours as needed for nausea or vomiting.   promethazine 25 MG tablet Commonly known as:  PHENERGAN Take 25 mg by mouth every 6 (six) hours as needed for nausea or vomiting.   Vitamin D (Ergocalciferol) 50000 units Caps capsule Commonly known as:  DRISDOL Take 50,000 Units by  mouth 2 (two) times a week. Tuesdays and Thurdays       Allergies  Allergen Reactions  . Ciprofloxacin Other (See Comments)    Hallucination  . Nitrofurantoin Other (See Comments)    Dizziness: Resulting in a fall  . Lipitor [Atorvastatin] Other (See Comments)    Muscle Weakness  . Penicillins Rash    Has patient had a PCN reaction causing immediate rash, facial/tongue/throat swelling, SOB or lightheadedness with hypotension: unknown Has patient had a PCN reaction causing severe rash involving mucus membranes or skin necrosis: unknown Has patient had a PCN reaction that required hospitalization: unknown Has patient had a PCN reaction occurring within the last 10 years: unknown If all of the above answers are "NO", then may proceed with Cephalosporin use. 2      Procedures/Studies: Ct Abdomen Pelvis Wo Contrast  Result Date: 01/19/2017 CLINICAL DATA:  Metastatic right upper tract urothelial carcinoma. Dizziness. EXAM: CT ABDOMEN AND PELVIS WITHOUT CONTRAST TECHNIQUE: Multidetector CT imaging of the abdomen and pelvis was performed following the standard protocol  without IV contrast. COMPARISON:  12/23/2016 CT chest, abdomen and pelvis. FINDINGS: Lower chest: Hypoventilatory chanor pelvis. 5. Chronic findings include: Aortic Atherosclerosis (ICD10-I70.0). Coronary atherosclerosis. Hepatic cirrhosis. Small hiatal hernia. Electronically Signed   By: Ilona Sorrel M.D.   On: 01/19/2017 21:47   Ct Head Wo Contrast  Result Date: 01/15/2017 CLINICAL DATA:  Nausea and dizziness for the past month. The dizziness is increasing. EXAM: CT HEAD WITHOUT CONTRAST TECHNIQUE: Contiguous axial images were obtained from the base of the skull through the vertex without intravenous contrast. COMPARISON:  01/04/2017. FINDINGS: Brain: Diffusely enlarged ventricles and subarachnoid spaces. Patchy white matter low density in both cerebral hemispheres. No intracranial hemorrhage, mass lesion or CT evidence of acute infarction. Vascular: No hyperdense vessel or unexpected calcification. Skull: Mild bilateral hyperostosis frontalis. Sinuses/Orbits: Unremarkable. Other: None. IMPRESSION: 1. No acute abnormality. 2. Stable atrophy and chronic small vessel white matter ischemic changes. Electronically Signed   By: Claudie Revering M.D.   On: 01/15/2017 13:57   Ct Head Wo Contrast  Result Date: 01/04/2017 CLINICAL DATA:  C/O DIZZINESS TODAY, PATIENT IS CURRENTLY BEING TREATED FOR UTI WITH ANITBX, HX STAGE 4 UROTHELIAL CA WITH LIVER METS AND ONGOING CHEMO. HX DM, HTN, CKD EXAM: CT HEAD WITHOUT CONTRAST TECHNIQUE: Contiguous axil mass, hemorrhage or edema. No evidence of intracranial metastasis. 2. Atrophy and chronic ischemic changes, as detailed above. Electronically Signed   By: Franki Cabot M.D.   On: 01/04/2017 13:53   US Venous Img Lower Unilateral Left  Result Date: 01/21/2017 CLINICAL DATA:  Left lower extremity pain and swelling. Shortness of breath. EXAM: LEFT LOWER EXTREMITY VENOUS DOPPLER ULTRASOUND TECHNIQUE: Gray-scale sonography with graded compression, as well as color Doppler and  duplex ultrasound were performed to evaluate the lower extremity deep venous systems from the level of the common femoral veinalf Veins: No evidence of thrombus. Normal compressibility and flow on color Doppler imaging. Superficial Great Saphenous Vein: No evidence of thrombus. Normal compressibility. Venous Reflux:  None. Other Findings:  None. IMPRESSION: No evidence of deep venous thrombosis. Electronically Signed   By: Dorise Bullion III M.D   On: 01/21/2017 17:00   Ir US Guide Vasc Access Right  Result Date: 01/13/2017 INDICATION: 84 year old female with metastatic urothelial carcinoma. She requires durable IV access for chemotherapy. She presents for port catheter placement. EXAM: IMPLANTED PORT A CATH PLACEMENT WITH ULTRASOUND AND FLUOROSCOPIC GUIDANCE MEDICATIONS: 1 g vancomycin; The antibiotic was administered within an  appropriate time interval prior to skin puncture. ANESTHESIA/SEDATION: Versed 1.5 mg IV; Fentanyl 75 mcg IV; Moderate Sedation Time:  15 minutes The patient was continuously monitored during the procedure by the interventional radiology nurse under my direct supervision. FLUOROSCOPY T Dg Chest Portable 1 View  Result Date: 01/21/2017 CLINICAL DATA:  Shortness of breath and wheezing EXAM: PORTABLE CHEST 1 VIEW COMPARISON:  04/27/2016 FINDINGS: Right IJ power port catheter tip lower SVC level. Marked cardiomegaly with mild central edema pattern, suspicious for volume overload or early CHF. Basilar atelectasis noted. No large effusion or pneumothorax. Upper lobes remain clear. Trachea is midline. Aorta is atherosclerotic. Bones are osteopenic. Degenerative changes of the spine and shoulders. IMPRESSION: Cardiomegaly with mild central edema as above. Basilar atelectasis Thoracic aortic atherosclerosis Electronically Signed   By: Jerilynn Mages.  Shick M.D.   On: 01/21/2017 13:57   Dg Hip Unilat With Pelvis 2-3 Views Right  Result Date: 01/08/2017 CLINICAL DATA:  Fall with right hip pain. EXAM: DG  HIP (WITH OR WITHOUT PELVIS) 2-3V RIGHT COMPARISON:  Pelvis film from 04/29/2016. CT scout film from 09/06/2016. FINDINGS: Fd   By: Misty Stanley M.D.   On: 01/08/2017 19:05   Ir Fluoro Guide Port Insertion Right  Result Date: 01/13/2017 INDId with a single inverted subdermal suture and the epidermis sealed with Dermabond. IMPRESSION: Successful placement of a right IJ approach Power Port with ultrasound and fluoroscopic guidance. The catheter is ready for use. Signed, Criselda Peaches, MD [facility] Electronically Signed   By: Jacqulynn Cadet M.D.   On: 01/13/2017 14:36      Subjective: Patient was seen and examined 01/25/2017, 12:10 PM Patient stable  Today. No acute distress.  No issues overnight Stable for discharge.  Discharge Exam:  Vitals:   01/24/17 2102 01/25/17 0611 01/25/17 0640 01/25/17 0817  BP: (!) 146/72 (!) 146/49    Pulse: 91 72    Resp: 16 16    Temp: 99.2 F (37.3 C) 98.2 F (36.8 C)    TempSrc: Oral Oral    SpO2: 93% 95%  94%  Weight:   76.8 kg (169 lb 5 oz)   Height:        General: Pt lying comfortably in bed & appears in no obvious distress. Cardiovascular: S1 & S2 heard, RRR, S1/S2 +. No murmurs, rubs, gallops or clicks. No JVD or pedal edema. Respiratory: Clear to auscultation without wheezing, rhonchi or crackles. No increased work of breathing. Abdominal:  Non distended, non tender & soft. No organomegaly or masses appreciated. Normal bowel sounds heard. CNS: Alert and oriented. No focal deficits. Extremities: Generalized weakness noted, no edema, no cyanosis    The results of significant diagnostics from this hospitalization (including imaging, microbiology, ancillary and laboratory) are listed below for reference.     Microbiology: Recent Results (from the past 240 hour(s))  Urine Culture     Status: Abnormal   Collection Time: 01/15/17  2:41 PM  Result Value Ref Range Status    Specimen Description URINE, CLEAN CATCH  Final   Special Requests NONE  Final   Culture >=100,000 COLONIES/mL KLEBSIELLA PNEUMONIAE (A)  Final   Report Status 01/18/2017 FINAL  Final   Organism ID, Bacteria KLEBSIELLA PNEUMONIAE (A)  Final      Susceptibility   Klebsiella pneumoniae - MIC*    AMPICILLIN >=32 RESISTANT Resistant     CEFAZOLIN <=4 SENSITIVE Sensitive     CEFTRIAXONE <=1 SENSITIVE Sensitive     CIPROFLOXACIN <=  0.25 SENSITIVE Sensitive     GENTAMICIN <=1 SENSITIVE Sensitive     IMIPENEM <=0.25 SENSITIVE Sensitive     NITROFURANTOIN 64 INTERMEDIATE Intermediate     TRIMETH/SULFA <=20 SENSITIVE Sensitive     AMPICILLIN/SULBACTAM 8 SENSITIVE Sensitive     PIP/TAZO <=4 SENSITIVE Sensitive     Extended ESBL NEGATIVE Sensitive     * >=100,000 COLONIES/mL KLEBSIELLA PNEUMONIAE     Labs: CBC: Recent Labs  Lab 01/19/17 1514 01/21/17 1343 01/23/17 0509  WBC 9.2 4.1 3.7*  HGB 9.2* 9.3* 7.9*  HCT 27.5* 27.7* 24.0*  MCV 99.3 97.2 96.0  PLT 186 160 161*   Basic Metabolic Panel: Recent Labs  Lab 01/21/17 1343 01/22/17 0511 01/23/17 0509 01/24/17 0713 01/25/17 0520  NA 126* 128* 128* 131* 132*  K 4.0 4.0 4.1 4.0 4.0  CL 97* 96* 95* 91* 88*  CO2 19* 21* 25 27 32  GLUCOSE 189* 140* 168* 173* 187*  BUN 34* 33* 28* 25* 23*  CREATININE 2.71* 2.37* 2.17* 2.05* 1.91*  CALCIUM 8.3* 8.3* 8.4* 8.6* 8.9   Liver Function Tests: No results for input(s): AST, ALT, ALKPHOS, BILITOT, PROT, ALBUMIN in the last 168 hours. BNP (last 3 results) Recent Labs    01/21/17 1343  BNP 209.0*   Cardiac Enzymes: Recent Labs  Lab 01/21/17 1343  TROPONINI <0.03   CBG: Recent Labs  Lab 01/24/17 1138 01/24/17 1655 01/24/17 2100 01/25/17 0729 01/25/17 1110  GLUCAP 186* 242* 152* 160* 199*   Hgb A1c No results for input(s): HGBA1C in the last 72 hours. Lipid Profile No results for input(s): CHOL, HDL, LDLCALC, TRIG, CHOLHDL, LDLDIRECT in the last 72 hours. Thyroid function  studies No results for input(s): TSH, T4TOTAL, T3FREE, THYROIDAB in the last 72 hours.  Invalid input(s): FREET3 Anemia work up No results for input(s): VITAMINB12, FOLATE, FERRITIN, TIBC, IRON, RETICCTPCT in the last 72 hours. Urinalysis    Component Value Date/Time   COLORURINE AMBER (A) 01/19/2017 1509   APPEARANCEUR CLOUDY (A) 01/19/2017 1509   LABSPEC 1.018 01/19/2017 1509   PHURINE 5.0 01/19/2017 1509   GLUCOSEU NEGATIVE 01/19/2017 1509   HGBUR NEGATIVE 01/19/2017 1509   BILIRUBINUR NEGATIVE 01/19/2017 1509   KETONESUR NEGATIVE 01/19/2017 1509   PROTEINUR 30 (A) 01/19/2017 1509   UROBILINOGEN 0.2 11/05/2014 2000   NITRITE NEGATIVE 01/19/2017 1509   LEUKOCYTESUR MODERATE (A) 01/19/2017 1509      Time coordinating discharge: Over 30 minutes  SIGNED: Deatra James, MD, FACP, FHM. [facility] Pager [phone]  If 7PM-7AM, please contact night-coverage [email or web address] Password [facility] 01/25/2017, 12:10 PM

## 2017-01-25 NOTE — Care Management Note (Signed)
Case Management Note  Patient Details  Name: Joann Hunter MRN: [id number] Date of Birth: Jun 05, 1932  Subjective/Objective:        Admitted with pulmonary edema. Pt is from home, lives with daughter. Has grandson who is with her during the day. She is mostly ind with ADL;s. Has strong family support. She has been recommended for [facility] PT. Pt is agreeable, daughter requesting RN for f/u also. Pt has used [facility] in the past and would like them again. Family aware HH has 48 hrs to make fist visit. Pt has RW pta and has no DME needs at DC.             Action/Plan: DC home today with HH. Blake Divine, [facility] rep, aware of referral and will pull pt info from chart. Plan discussed with daughter [name]).   Expected Discharge Date:  01/25/17               Expected Discharge Plan:  [place]  In-House Referral:  NA  Discharge planning Services  CM Consult  Post Acute Care Choice:  Home Health Choice offered to:  Patient  HH Arranged:  PT [place]:  [place]  Status of Service:  Completed, signed off  Sherald Barge, RN 01/25/2017, 12:13 PM

## 2017-01-25 NOTE — Progress Notes (Signed)
Inpatient Diabetes Program Recommendations  AACE/ADA: New Consensus Statement on Inpatient Glycemic Control (2015)  Target Ranges:  Prepandial:   less than 140 mg/dL      Peak postprandial:   less than 180 mg/dL (1-2 hours)      Critically ill patients:  140 - 180 mg/dL   Results for Joann Hunter, Joann Hunter (MRN [id number]) as of 01/25/2017 07:18  Ref. Range 01/24/2017 08:00 01/24/2017 11:38 01/24/2017 16:55 01/24/2017 21:00  Glucose-Capillary Latest Ref Range: 65 - 99 mg/dL 161 (H) 186 (H) 242 (H) 152 (H)   Review of Glycemic Control  Diabetes history: DM2 Outpatient Diabetes medications: None Current orders for Inpatient glycemic control:Novolog 0-15 units TID with meals, Novolog 0-5 units QHS   Inpatient Diabetes Program Recommendations: Insulin - Meal Coverage: Post prandial glucose is consistently elevated. Please consider ordering Novolog 2 units TID with meals for meal coverage if patient eats at least 50% of meals.  Thanks, Barnie Alderman, RN, MSN, CDE Diabetes Coordinator Inpatient Diabetes Program [phone] (Team Pager from 8am to 5pm)

## 2017-01-26 ENCOUNTER — Inpatient Hospital Stay (HOSPITAL_COMMUNITY): Payer: Medicare Other

## 2017-01-26 ENCOUNTER — Other Ambulatory Visit (HOSPITAL_COMMUNITY): Payer: Self-pay | Admitting: Emergency Medicine

## 2017-01-26 ENCOUNTER — Encounter (HOSPITAL_COMMUNITY): Payer: Self-pay

## 2017-01-26 DIAGNOSIS — D649 Anemia, unspecified: Secondary | ICD-10-CM

## 2017-01-26 LAB — CBC WITH DIFFERENTIAL/PLATELET
Basophils Absolute: 0.1 10*3/uL (ref 0.0–0.1)
Basophils Relative: 1 %
EOS ABS: 0.1 10*3/uL (ref 0.0–0.7)
Eosinophils Relative: 1 %
HCT: 32.6 % — ABNORMAL LOW (ref 36.0–46.0)
Hemoglobin: 10.5 g/dL — ABNORMAL LOW (ref 12.0–15.0)
LYMPHS ABS: 1.4 10*3/uL (ref 0.7–4.0)
Lymphocytes Relative: 19 %
MCH: 31.7 pg (ref 26.0–34.0)
MCHC: 32.2 g/dL (ref 30.0–36.0)
MCV: 98.5 fL (ref 78.0–100.0)
Monocytes Absolute: 0.9 10*3/uL (ref 0.1–1.0)
Monocytes Relative: 12 %
NEUTROS PCT: 67 %
Neutro Abs: 4.7 10*3/uL (ref 1.7–7.7)
PLATELETS: 189 10*3/uL (ref 150–400)
RBC: 3.31 MIL/uL — AB (ref 3.87–5.11)
RDW: 12.8 % (ref 11.5–15.5)
WBC: 7.2 10*3/uL (ref 4.0–10.5)

## 2017-01-26 LAB — PREPARE RBC (CROSSMATCH)

## 2017-01-26 MED ORDER — DIPHENHYDRAMINE HCL 25 MG PO CAPS
ORAL_CAPSULE | ORAL | Status: AC
  Filled 2017-01-26: qty 1

## 2017-01-26 MED ORDER — DIPHENHYDRAMINE HCL 25 MG PO CAPS
25.0000 mg | ORAL_CAPSULE | Freq: Once | ORAL | Status: AC
  Administered 2017-01-26: 25 mg via ORAL

## 2017-01-26 MED ORDER — HEPARIN SOD (PORK) LOCK FLUSH 100 UNIT/ML IV SOLN
500.0000 [IU] | Freq: Every day | INTRAVENOUS | Status: AC | PRN
  Administered 2017-01-26: 500 [IU]

## 2017-01-26 MED ORDER — ACETAMINOPHEN 325 MG PO TABS
650.0000 mg | ORAL_TABLET | Freq: Once | ORAL | Status: AC
  Administered 2017-01-26: 650 mg via ORAL

## 2017-01-26 MED ORDER — ACETAMINOPHEN 325 MG PO TABS
ORAL_TABLET | ORAL | Status: AC
  Filled 2017-01-26: qty 2

## 2017-01-26 MED ORDER — HEPARIN SOD (PORK) LOCK FLUSH 100 UNIT/ML IV SOLN
INTRAVENOUS | Status: AC
Start: 2017-01-26 — End: 2017-01-26
  Filled 2017-01-26: qty 5

## 2017-01-26 MED ORDER — SODIUM CHLORIDE 0.9 % IV SOLN: 250.0000 mL | Freq: Once | INTRAVENOUS | Status: DC

## 2017-01-26 MED ORDER — SODIUM CHLORIDE 0.9 % IV SOLN
INTRAVENOUS | Status: DC
  Administered 2017-01-26: 12:00:00 via INTRAVENOUS

## 2017-01-26 MED ORDER — SODIUM CHLORIDE 0.9% FLUSH
10.0000 mL | INTRAVENOUS | Status: AC | PRN
  Administered 2017-01-26: 10 mL

## 2017-01-26 NOTE — Progress Notes (Signed)
Pts daughter called and stated that her mom was still really dizzy.  The meclizine was not helping at all.  They were discharged from the hospital yesterday and she knows that they did a complete work up and nothing showed any reason for her dizziness.  She just wants to know is there something else for the patient to take.  After discussing the issue with Mike Craze NP, the pts hemoglobin 3 days was 7.9.  Pts daughter was offered to bring her mother in for 1 unit of blood.  Lab appt made for 10:50 am and clinic appt at 11:30 am.  Daughter verbalized understanding.  Orders entered for 1 unit of PRBCs and Jay in lab notified that type and screen and prepare has been entered and released for him.  Spoke with Kristeen Miss in blood bank to let her know that we would be transfusing Joann Hunter today.

## 2017-01-26 NOTE — Patient Instructions (Signed)
[place] at [facility] Discharge Instructions  RECOMMENDATIONS MADE BY THE CONSULTANT AND ANY TEST RESULTS WILL BE SENT TO YOUR REFERRING PHYSICIAN.  Received 1 unit of PRBC's today.Follow-up as scheduled. Call clinic for any questions or concerns  Thank you for choosing [place] at [facility] to provide your oncology and hematology care.  To afford each patient quality time with our provider, please arrive at least 15 minutes before your scheduled appointment time.    If you have a lab appointment with the [facility] please come in thru the  Main Entrance and check in at the main information desk  You need to re-schedule your appointment should you arrive 10 or more minutes late.  We strive to give you quality time with our providers, and arriving late affects you and other patients whose appointments are after yours.  Also, if you no show three or more times for appointments you may be dismissed from the clinic at the providers discretion.     Again, thank you for choosing [facility].  Our hope is that these requests will decrease the amount of time that you wait before being seen by our physicians.       _____________________________________________________________  Should you have questions after your visit to [facility], please contact our office at [phone] between the hours of 8:30 a.m. and 4:30 p.m.  Voicemails left after 4:30 p.m. will not be returned until the following business day.  For prescription refill requests, have your pharmacy contact our office.       Resources For Cancer Patients and their Caregivers ? American Cancer Society: Can assist with transportation, wigs, general needs, runs Look Good Feel Better.        865-273-0232 ? Cancer Care: Provides financial assistance, online support groups, medication/co-pay assistance.  1-800-813-HOPE 302-585-3019) ? [facility] Assists [place] cancer patients and their families through emotional , educational and financial support.  678-562-1199 ? [place] DSS Where to apply for food stamps, Medicaid and utility assistance. 575-629-9472 ? [employer]: Transportation to medical appointments. 501-881-6927 ? Social Security Administration: May apply for disability if have a Stage IV cancer. (272) 257-9623 561-315-1396 ? [employer], Disability and Transit Services: Assists with nutrition, care and transit needs. [place] Support Programs: @10RELATIVEDAYS @ > Cancer Support Group  2nd Tuesday of the month 1pm-2pm, Journey Room  > Creative Journey  3rd Tuesday of the month 1130am-1pm, Journey Room  > Look Good Feel Better  1st Wednesday of the month 10am-12 noon, Journey Room (Call [place] to register [phone])

## 2017-01-26 NOTE — Progress Notes (Signed)
Joann Hunter tolerated blood transfusion well without complaints or incident. VSS prior to ,during and after blood transfusion complete. Pt discharged via wheelchair in satisfactory condition accompanied by her daughter

## 2017-01-27 LAB — TYPE AND SCREEN
ABO/RH(D): O NEG
Antibody Screen: NEGATIVE
UNIT DIVISION: 0

## 2017-01-27 LAB — BPAM RBC
Blood Product Expiration Date: 201902092359
ISSUE DATE / TIME: 201901241340
Unit Type and Rh: 9500

## 2017-01-28 ENCOUNTER — Inpatient Hospital Stay (HOSPITAL_COMMUNITY)
Admission: EM | Admit: 2017-01-28 | Discharge: 2017-02-03 | DRG: 641 | Disposition: A | Discharge status: Home Health | Payer: Medicare Other | Attending: Family Medicine | Admitting: Family Medicine

## 2017-01-28 ENCOUNTER — Encounter (HOSPITAL_COMMUNITY): Payer: Self-pay | Admitting: Emergency Medicine

## 2017-01-28 ENCOUNTER — Other Ambulatory Visit: Payer: Self-pay

## 2017-01-28 ENCOUNTER — Emergency Department (HOSPITAL_COMMUNITY): Payer: Medicare Other

## 2017-01-28 DIAGNOSIS — C649 Malignant neoplasm of unspecified kidney, except renal pelvis: Secondary | ICD-10-CM | POA: Diagnosis present

## 2017-01-28 DIAGNOSIS — E876 Hypokalemia: Secondary | ICD-10-CM | POA: Diagnosis present

## 2017-01-28 DIAGNOSIS — I951 Orthostatic hypotension: Secondary | ICD-10-CM | POA: Diagnosis present

## 2017-01-28 DIAGNOSIS — Z96641 Presence of right artificial hip joint: Secondary | ICD-10-CM | POA: Diagnosis present

## 2017-01-28 DIAGNOSIS — D638 Anemia in other chronic diseases classified elsewhere: Secondary | ICD-10-CM | POA: Diagnosis present

## 2017-01-28 DIAGNOSIS — C689 Malignant neoplasm of urinary organ, unspecified: Secondary | ICD-10-CM | POA: Diagnosis not present

## 2017-01-28 DIAGNOSIS — I255 Ischemic cardiomyopathy: Secondary | ICD-10-CM | POA: Diagnosis present

## 2017-01-28 DIAGNOSIS — E1122 Type 2 diabetes mellitus with diabetic chronic kidney disease: Secondary | ICD-10-CM | POA: Diagnosis present

## 2017-01-28 DIAGNOSIS — I130 Hypertensive heart and chronic kidney disease with heart failure and stage 1 through stage 4 chronic kidney disease, or unspecified chronic kidney disease: Secondary | ICD-10-CM | POA: Diagnosis present

## 2017-01-28 DIAGNOSIS — I252 Old myocardial infarction: Secondary | ICD-10-CM

## 2017-01-28 DIAGNOSIS — E871 Hypo-osmolality and hyponatremia: Secondary | ICD-10-CM | POA: Diagnosis not present

## 2017-01-28 DIAGNOSIS — E1121 Type 2 diabetes mellitus with diabetic nephropathy: Secondary | ICD-10-CM | POA: Diagnosis not present

## 2017-01-28 DIAGNOSIS — N189 Chronic kidney disease, unspecified: Secondary | ICD-10-CM | POA: Diagnosis not present

## 2017-01-28 DIAGNOSIS — N179 Acute kidney failure, unspecified: Secondary | ICD-10-CM | POA: Diagnosis not present

## 2017-01-28 DIAGNOSIS — E039 Hypothyroidism, unspecified: Secondary | ICD-10-CM | POA: Diagnosis present

## 2017-01-28 DIAGNOSIS — N184 Chronic kidney disease, stage 4 (severe): Secondary | ICD-10-CM | POA: Diagnosis not present

## 2017-01-28 DIAGNOSIS — E785 Hyperlipidemia, unspecified: Secondary | ICD-10-CM | POA: Diagnosis present

## 2017-01-28 DIAGNOSIS — R42 Dizziness and giddiness: Secondary | ICD-10-CM

## 2017-01-28 DIAGNOSIS — Z955 Presence of coronary angioplasty implant and graft: Secondary | ICD-10-CM

## 2017-01-28 DIAGNOSIS — I5032 Chronic diastolic (congestive) heart failure: Secondary | ICD-10-CM | POA: Diagnosis present

## 2017-01-28 DIAGNOSIS — I2510 Atherosclerotic heart disease of native coronary artery without angina pectoris: Secondary | ICD-10-CM | POA: Diagnosis present

## 2017-01-28 DIAGNOSIS — C787 Secondary malignant neoplasm of liver and intrahepatic bile duct: Secondary | ICD-10-CM | POA: Diagnosis present

## 2017-01-28 DIAGNOSIS — D631 Anemia in chronic kidney disease: Secondary | ICD-10-CM | POA: Diagnosis present

## 2017-01-28 DIAGNOSIS — Z79899 Other long term (current) drug therapy: Secondary | ICD-10-CM

## 2017-01-28 DIAGNOSIS — I129 Hypertensive chronic kidney disease with stage 1 through stage 4 chronic kidney disease, or unspecified chronic kidney disease: Secondary | ICD-10-CM | POA: Diagnosis present

## 2017-01-28 LAB — URINALYSIS, ROUTINE W REFLEX MICROSCOPIC
BACTERIA UA: NONE SEEN
BILIRUBIN URINE: NEGATIVE
Glucose, UA: NEGATIVE mg/dL
HGB URINE DIPSTICK: NEGATIVE
KETONES UR: NEGATIVE mg/dL
NITRITE: NEGATIVE
PROTEIN: NEGATIVE mg/dL
Specific Gravity, Urine: 1.015 (ref 1.005–1.030)
pH: 5 (ref 5.0–8.0)

## 2017-01-28 LAB — COMPREHENSIVE METABOLIC PANEL
ALT: 12 U/L — ABNORMAL LOW (ref 14–54)
AST: 32 U/L (ref 15–41)
Albumin: 2.6 g/dL — ABNORMAL LOW (ref 3.5–5.0)
Alkaline Phosphatase: 79 U/L (ref 38–126)
Anion gap: 11 (ref 5–15)
BUN: 24 mg/dL — ABNORMAL HIGH (ref 6–20)
CO2: 29 mmol/L (ref 22–32)
Calcium: 8.4 mg/dL — ABNORMAL LOW (ref 8.9–10.3)
Chloride: 85 mmol/L — ABNORMAL LOW (ref 101–111)
Creatinine, Ser: 2.66 mg/dL — ABNORMAL HIGH (ref 0.44–1.00)
GFR calc Af Amer: 18 mL/min — ABNORMAL LOW (ref >60)
GFR calc non Af Amer: 16 mL/min — ABNORMAL LOW (ref >60)
Glucose, Bld: 168 mg/dL — ABNORMAL HIGH (ref 65–99)
Potassium: 3.6 mmol/L (ref 3.5–5.1)
Sodium: 125 mmol/L — ABNORMAL LOW (ref 135–145)
Total Bilirubin: 0.8 mg/dL (ref 0.3–1.2)
Total Protein: 6.1 g/dL — ABNORMAL LOW (ref 6.5–8.1)

## 2017-01-28 LAB — CBC WITH DIFFERENTIAL/PLATELET
Basophils Absolute: 0 10*3/uL (ref 0.0–0.1)
Basophils Relative: 1 %
Eosinophils Absolute: 0.1 10*3/uL (ref 0.0–0.7)
Eosinophils Relative: 1 %
HCT: 35.2 % — ABNORMAL LOW (ref 36.0–46.0)
Hemoglobin: 11.8 g/dL — ABNORMAL LOW (ref 12.0–15.0)
Lymphocytes Relative: 23 %
Lymphs Abs: 1.4 10*3/uL (ref 0.7–4.0)
MCH: 31.2 pg (ref 26.0–34.0)
MCHC: 33.5 g/dL (ref 30.0–36.0)
MCV: 93.1 fL (ref 78.0–100.0)
Monocytes Absolute: 1 10*3/uL (ref 0.1–1.0)
Monocytes Relative: 16 %
Neutro Abs: 3.8 10*3/uL (ref 1.7–7.7)
Neutrophils Relative %: 59 %
Platelets: 214 10*3/uL (ref 150–400)
RBC: 3.78 MIL/uL — ABNORMAL LOW (ref 3.87–5.11)
RDW: 14.8 % (ref 11.5–15.5)
WBC: 6.4 10*3/uL (ref 4.0–10.5)

## 2017-01-28 LAB — NA AND K (SODIUM & POTASSIUM), RAND UR
Potassium Urine: 55 mmol/L
SODIUM UR: 20 mmol/L

## 2017-01-28 MED ORDER — DEXTROSE 5 % IV SOLN
1.0000 g | INTRAVENOUS | Status: DC
  Administered 2017-01-29: 1 g via INTRAVENOUS
  Filled 2017-01-28: qty 10

## 2017-01-28 MED ORDER — ACETAMINOPHEN 325 MG PO TABS
650.0000 mg | ORAL_TABLET | ORAL | Status: DC | PRN
  Administered 2017-02-02: 650 mg via ORAL

## 2017-01-28 MED ORDER — LEVOTHYROXINE SODIUM 112 MCG PO TABS
112.0000 ug | ORAL_TABLET | Freq: Every day | ORAL | Status: DC
  Administered 2017-01-29 – 2017-02-03 (×6): 112 ug via ORAL
  Filled 2017-01-28 (×6): qty 1

## 2017-01-28 MED ORDER — METOPROLOL TARTRATE 12.5 MG HALF TABLET
12.5000 mg | ORAL_TABLET | Freq: Two times a day (BID) | ORAL | Status: DC
  Administered 2017-01-29 – 2017-02-01 (×7): 12.5 mg via ORAL
  Filled 2017-01-28 (×7): qty 1

## 2017-01-28 MED ORDER — SODIUM CHLORIDE 0.9 % IV SOLN: 250.0000 mL | INTRAVENOUS | Status: DC | PRN

## 2017-01-28 MED ORDER — MECLIZINE HCL 25 MG PO TABS
25.0000 mg | ORAL_TABLET | Freq: Four times a day (QID) | ORAL | Status: DC | PRN
  Administered 2017-01-29 – 2017-02-03 (×16): 25 mg via ORAL
  Filled 2017-01-28 (×16): qty 1

## 2017-01-28 MED ORDER — HYDROCODONE-ACETAMINOPHEN 5-325 MG PO TABS
1.0000 | ORAL_TABLET | Freq: Four times a day (QID) | ORAL | Status: DC | PRN
  Administered 2017-01-29 – 2017-02-01 (×2): 1 via ORAL
  Filled 2017-01-28 (×4): qty 1

## 2017-01-28 MED ORDER — ENSURE ENLIVE PO LIQD
237.0000 mL | Freq: Two times a day (BID) | ORAL | Status: DC
  Administered 2017-01-29 – 2017-02-03 (×8): 237 mL via ORAL
  Filled 2017-01-28: qty 237

## 2017-01-28 MED ORDER — DEXTROSE 5 % IV SOLN
1.0000 g | Freq: Once | INTRAVENOUS | Status: AC
  Administered 2017-01-29: 1 g via INTRAVENOUS
  Filled 2017-01-28: qty 10

## 2017-01-28 MED ORDER — SODIUM CHLORIDE 0.9% FLUSH: 3.0000 mL | INTRAVENOUS | Status: DC | PRN

## 2017-01-28 MED ORDER — SODIUM CHLORIDE 0.9% FLUSH
3.0000 mL | Freq: Two times a day (BID) | INTRAVENOUS | Status: DC
  Administered 2017-01-28 – 2017-01-31 (×3): 3 mL via INTRAVENOUS

## 2017-01-28 MED ORDER — HEPARIN SODIUM (PORCINE) 5000 UNIT/ML IJ SOLN
5000.0000 [IU] | Freq: Three times a day (TID) | INTRAMUSCULAR | Status: DC
  Administered 2017-01-29 – 2017-02-03 (×15): 5000 [IU] via SUBCUTANEOUS
  Filled 2017-01-28 (×14): qty 1

## 2017-01-28 MED ORDER — ONDANSETRON HCL 4 MG/2ML IJ SOLN: 4.0000 mg | Freq: Four times a day (QID) | INTRAMUSCULAR | Status: DC | PRN

## 2017-01-28 MED ORDER — PROCHLORPERAZINE MALEATE 10 MG PO TABS
10.0000 mg | ORAL_TABLET | Freq: Four times a day (QID) | ORAL | Status: DC | PRN
  Filled 2017-01-28: qty 1

## 2017-01-28 MED ORDER — PROMETHAZINE HCL 25 MG PO TABS: 25.0000 mg | ORAL_TABLET | Freq: Four times a day (QID) | ORAL | Status: DC | PRN

## 2017-01-28 MED ORDER — AMLODIPINE BESYLATE 5 MG PO TABS
5.0000 mg | ORAL_TABLET | Freq: Every day | ORAL | Status: DC
  Administered 2017-01-29 – 2017-01-30 (×2): 5 mg via ORAL
  Filled 2017-01-28 (×2): qty 1

## 2017-01-28 MED ORDER — TORSEMIDE 20 MG PO TABS
40.0000 mg | ORAL_TABLET | Freq: Every day | ORAL | Status: DC
  Administered 2017-01-29 – 2017-01-30 (×2): 40 mg via ORAL
  Filled 2017-01-28 (×2): qty 2

## 2017-01-28 MED ORDER — ASPIRIN EC 81 MG PO TBEC
81.0000 mg | DELAYED_RELEASE_TABLET | Freq: Every day | ORAL | Status: DC
  Administered 2017-01-29 – 2017-02-03 (×6): 81 mg via ORAL
  Filled 2017-01-28 (×7): qty 1

## 2017-01-28 NOTE — ED Notes (Signed)
Nurse will collect labs. 

## 2017-01-28 NOTE — H&P (Addendum)
History and Physical    Joann Hunter XTG:[id number] DOB: 05-12-1932 DOA: 01/28/2017  PCP: Monico Blitz, MD  Patient coming from: Home  I have personally briefly reviewed patient's old medical records in [place]  Chief Complaint: Vertigo  HPI: Joann Hunter is a 84 y.o. female with medical history significant of stage IV chronic kidney disease, ischemic cardiomyopathy, type 2 diabetes mellitus and ureteral failure cancer stage IV with liver metastases currently undergoing chemotherapy.  Patient was seen in ED on 1/17, found to have UTI, prescribed Keflex.  Returned to ED on 1/19, was admitted to our [facility] service with worsening SOB, increasing BLE edema, orthopnea, AKI, and hyponatremia.  Mild pulm edema on CXR.  She was initially diuresed with IV lasix, which was switched to torsemide.  Sodium, creatinine, orthopnea, edema.  All improved during that admission from 1/19 to 1/23 with ongoing diuresis.  See Dr. Cherlyn Labella discharge summary on 1/23 for details.  They also treated her UTI with rocephin during that admission and discharged her on levaquin.  Per his note it looks like they were planning on discharging her on Torsemide 40mg  PO daily, however this never made it to her med list or pharmacy (only levaquin prescribed on discharge).  Since discharge she has had persistent vertigo.  Oncology thought it might be due to anemia so they transfused 1 unit PRBC x2 days ago.  Due to persistent vertigo, which is severe enough to the point patient cant walk to bathroom, patient returns to the ED.  Symptoms worse with position change, better at rest.  ED Course: HGB 11.x, Sodium 125 down from 132 at discharge, creat 2.66 up from 1.9 at discharge.  CXR neg.   Review of Systems: As per HPI otherwise 10 point review of systems negative.   Past Medical History:  Diagnosis Date  . Arthritis   . Back pain, chronic   . CAD (coronary artery disease)    NSTEMI 04/2011 with newly  diagnosed 3V CAD s/p PCI/DES mLAD 04/11/11 with residual dz (per Dr. Burt Knack, should have consideration of PCI of the occluded RCA based on symptoms and/or consideration of outpatient nuclear perfusion testing after the patient recovers from her infarct)  . Cancer ([place])    kidney ( Nov.2017)  . Chronic kidney disease (CKD), stage IV (severe) ([place]) 12/13/2013  . Hyperglycemia   . Hyperlipidemia   . Hypertension   . Hypothyroidism   . Ischemic cardiomyopathy    EF 45% by cath 04/20/11, 50-55% by echo 04/22/11. hypotension limiting medication titration   . Myocardial infarction ([place]) 2013  . Type 2 diabetes with nephropathy ([place]) 07/26/2014  . UTI (urinary tract infection) 12/13/2013    Past Surgical History:  Procedure Laterality Date  . BACK SURGERY    . BREAST LUMPECTOMY     right  . CORONARY STENT PLACEMENT    . HIP ARTHROPLASTY Right 04/29/2016   Procedure: ARTHROPLASTY BIPOLAR HIP (HEMIARTHROPLASTY);  Surgeon: Carole Civil, MD;  Location: [facility] ORS;  Service: Orthopedics;  Laterality: Right;  . IR FLUORO GUIDE PORT INSERTION RIGHT  01/13/2017  . IR US GUIDE VASC ACCESS RIGHT  01/13/2017  . LEFT HEART CATHETERIZATION WITH CORONARY ANGIOGRAM N/A 04/20/2011   Procedure: LEFT HEART CATHETERIZATION WITH CORONARY ANGIOGRAM;  Surgeon: Burnell Blanks, MD;  Location: [facility] CATH LAB;  Service: Cardiovascular;  Laterality: N/A;  . PERCUTANEOUS CORONARY STENT INTERVENTION (PCI-S) N/A 04/21/2011   Procedure: PERCUTANEOUS CORONARY STENT INTERVENTION (PCI-S);  Surgeon: Sherren Mocha, MD;  Location: [facility]  CATH LAB;  Service: Cardiovascular;  Laterality: N/A;     reports that  has never smoked. she has never used smokeless tobacco. She reports that she does not drink alcohol or use drugs.  Allergies  Allergen Reactions  . Ciprofloxacin Other (See Comments)    Hallucination  . Nitrofurantoin Other (See Comments)    Dizziness: Resulting in a fall  . Lipitor [Atorvastatin] Other (See Comments)     Muscle Weakness  . Penicillins Rash    Has patient had a PCN reaction causing immediate rash, facial/tongue/throat swelling, SOB or lightheadedness with hypotension: unknown Has patient had a PCN reaction causing severe rash involving mucus membranes or skin necrosis: unknown Has patient had a PCN reaction that required hospitalization: unknown Has patient had a PCN reaction occurring within the last 10 years: unknown If all of the above answers are "NO", then may proceed with Cephalosporin use. 2    Family History  Problem Relation Age of Onset  . Other Mother   . Cancer Father   . Other Unknown        no known family cardiac disease  . Other Brother        Diptheria  . Depression Sister   . Pneumonia Sister   . Other Brother        murdered     Prior to Admission medications   Medication Sig Start Date End Date Taking? Authorizing Provider  amLODipine (NORVASC) 5 MG tablet Take 1 tablet (5 mg total) by mouth daily. 05/03/16  Yes Elgergawy, Silver Huguenin, MD  aspirin EC 81 MG tablet Take 81 mg by mouth daily.   Yes [provider]  feeding supplement, ENSURE ENLIVE, (ENSURE ENLIVE) LIQD Take 237 mLs by mouth 2 (two) times daily between meals. 05/02/16  Yes Elgergawy, Silver Huguenin, MD  fish oil-omega-3 fatty acids 1000 MG capsule Take 1 g by mouth daily.   Yes [provider]  Gemcitabine HCl (GEMZAR IV) Inject into the vein. Day 1,8,15 every 28 days   Yes [provider]  HYDROcodone-acetaminophen (NORCO/VICODIN) 5-325 MG tablet Take 1 tablet by mouth every 6 (six) hours as needed for moderate pain. 06/03/16  Yes Carole Civil, MD  levothyroxine (SYNTHROID, LEVOTHROID) 112 MCG tablet Take 1 tablet (112 mcg total) by mouth daily before breakfast. 09/08/16  Yes Holley Bouche, NP  lidocaine-prilocaine (EMLA) cream Apply a quarter size amount to affected area 1 hour prior to coming to chemotherapy.  Do not rub in.  Cover with plastic wrap. 01/09/17  Yes Jacquelin Hawking, NP  meclizine (ANTIVERT) 25 MG tablet Take 25 mg by mouth 4 (four) times daily as needed. Dizziness   Yes [provider]  metoprolol tartrate (LOPRESSOR) 25 MG tablet Take 1 tablet (25 mg total) by mouth 2 (two) times daily. Patient taking differently: Take 12.5 mg by mouth 2 (two) times daily.  05/02/16  Yes Elgergawy, Silver Huguenin, MD  nitroGLYCERIN (NITROSTAT) 0.4 MG SL tablet Place 0.4 mg under the tongue every 5 (five) minutes x 3 doses as needed. For chest pain. 04/23/11  Yes Dunn, Dayna N, PA-C  prochlorperazine (COMPAZINE) 10 MG tablet Take 1 tablet (10 mg total) by mouth every 6 (six) hours as needed for nausea or vomiting. 01/29/16  Yes Penland, Kelby Fam, MD  promethazine (PHENERGAN) 25 MG tablet Take 25 mg by mouth every 6 (six) hours as needed for nausea or vomiting.   Yes [provider]  Vitamin D, Ergocalciferol, (DRISDOL) 50000 UNITS  CAPS Take 50,000 Units by mouth 2 (two) times a week. Tuesdays and Thurdays 05/30/12  Yes [provider]    Physical Exam: Vitals:   01/28/17 2130 01/28/17 2200 01/28/17 2230 01/28/17 2315  BP: (!) 137/59 (!) 150/60 (!) 126/57 (!) 125/54  Pulse: 61 62 (!) 59 65  Resp: 17 17 13 18   Temp:      TempSrc:      SpO2: 95% 100% 95% 94%  Weight:      Height:        Constitutional: NAD, calm, comfortable Eyes: PERRL, lids and conjunctivae normal ENMT: Mucous membranes are moist. Posterior pharynx clear of any exudate or lesions.Normal dentition.  Neck: normal, supple, no masses, no thyromegaly Respiratory: clear to auscultation bilaterally, no wheezing, no crackles. Normal respiratory effort. No accessory muscle use.  Cardiovascular: Regular rate and rhythm, no murmurs / rubs / gallops. Trace BLE pitting edema. 2+ pedal pulses. No carotid bruits.  Abdomen: no tenderness, no masses palpated. No hepatosplenomegaly. Bowel sounds positive.  Musculoskeletal: no clubbing / cyanosis. No joint deformity upper and lower  extremities. Good ROM, no contractures. Normal muscle tone.  Skin: no rashes, lesions, ulcers. No induration Neurologic: CN 2-12 grossly intact. Sensation intact, DTR normal. Strength 5/5 in all 4.  Psychiatric: Normal judgment and insight. Alert and oriented x 3. Normal mood.    Labs on Admission: I have personally reviewed following labs and imaging studies  CBC: Recent Labs  Lab 01/23/17 0509 01/26/17 1030 01/28/17 1730  WBC 3.7* 7.2 6.4  NEUTROABS  --  4.7 3.8  HGB 7.9* 10.5* 11.8*  HCT 24.0* 32.6* 35.2*  MCV 96.0 98.5 93.1  PLT 121* 189 440   Basic Metabolic Panel: Recent Labs  Lab 01/22/17 0511 01/23/17 0509 01/24/17 0713 01/25/17 0520 01/28/17 1730  NA 128* 128* 131* 132* 125*  K 4.0 4.1 4.0 4.0 3.6  CL 96* 95* 91* 88* 85*  CO2 21* 25 27 32 29  GLUCOSE 140* 168* 173* 187* 168*  BUN 33* 28* 25* 23* 24*  CREATININE 2.37* 2.17* 2.05* 1.91* 2.66*  CALCIUM 8.3* 8.4* 8.6* 8.9 8.4*   GFR: Estimated Creatinine Clearance: 15.6 mL/min (A) (by C-G formula based on SCr of 2.66 mg/dL (H)). Liver Function Tests: Recent Labs  Lab 01/28/17 1730  AST 32  ALT 12*  ALKPHOS 79  BILITOT 0.8  PROT 6.1*  ALBUMIN 2.6*   No results for input(s): LIPASE, AMYLASE in the last 168 hours. No results for input(s): AMMONIA in the last 168 hours. Coagulation Profile: No results for input(s): INR, PROTIME in the last 168 hours. Cardiac Enzymes: No results for input(s): CKTOTAL, CKMB, CKMBINDEX, TROPONINI in the last 168 hours. BNP (last 3 results) No results for input(s): PROBNP in the last 8760 hours. HbA1C: No results for input(s): HGBA1C in the last 72 hours. CBG: Recent Labs  Lab 01/24/17 1138 01/24/17 1655 01/24/17 2100 01/25/17 0729 01/25/17 1110  GLUCAP 186* 242* 152* 160* 199*   Lipid Profile: No results for input(s): CHOL, HDL, LDLCALC, TRIG, CHOLHDL, LDLDIRECT in the last 72 hours. Thyroid Function Tests: No results for input(s): TSH, T4TOTAL, FREET4, T3FREE,  THYROIDAB in the last 72 hours. Anemia Panel: No results for input(s): VITAMINB12, FOLATE, FERRITIN, TIBC, IRON, RETICCTPCT in the last 72 hours. Urine analysis:    Component Value Date/Time   COLORURINE YELLOW 01/28/2017 2130   APPEARANCEUR HAZY (A) 01/28/2017 2130   LABSPEC 1.015 01/28/2017 2130   PHURINE 5.0 01/28/2017 2130   GLUCOSEU NEGATIVE 01/28/2017  2130   HGBUR NEGATIVE 01/28/2017 2130   BILIRUBINUR NEGATIVE 01/28/2017 2130   KETONESUR NEGATIVE 01/28/2017 2130   PROTEINUR NEGATIVE 01/28/2017 2130   UROBILINOGEN 0.2 11/05/2014 2000   NITRITE NEGATIVE 01/28/2017 2130   LEUKOCYTESUR TRACE (A) 01/28/2017 2130    Radiological Exams on Admission: Dg Chest Port 1 View  Result Date: 01/28/2017 CLINICAL DATA:  Hyponatremia. EXAM: PORTABLE CHEST 1 VIEW COMPARISON:  01/21/2017 FINDINGS: The heart size and mediastinal contours are within normal limits. Aortic atherosclerosis. Both lungs are clear. Right-sided power port remains in appropriate position. IMPRESSION: No active disease. Electronically Signed   By: Earle Gell M.D.   On: 01/28/2017 23:24    EKG: Independently reviewed.  Assessment/Plan Principal Problem:   Hyponatremia Active Problems:   Acute kidney injury superimposed on chronic kidney disease (HCC)   Chronic kidney disease (CKD), stage IV (severe) (HCC)   Vertigo   Type 2 diabetes with nephropathy ([place])   Urothelial cancer ([place])    1. Hyponatremia - 1. Given HPI, mild hypervolemia with hyponatremia seems most likely 2. Will resume the missing Torsemide 40mg  PO daily, first dose now 3. BMP daily 4. Urine sodium 20 2. AKI on CKD stage 4 - 1. Could be cardio-renal syndrome (was felt to be this last admit), odd though that its occurring despite nl EF and only grade 1 diastolic dysfunction... 2. Note improvement in creat last admission with addition of diuretics.  And worsening in the intervening 3 days while off of diuretics.  Therefore will try putting her back  on diuretics. 3. Daily BMP 4. Torsemide as above 3. Vertigo - 1. Could be due to hyponatremia, but per daughter patient never really improved even with improvement in hyponatremia last admission at day of discharge. 2. Also not improved post transfusion, not due to anemia. 3. Will obtain MRI brain given h/o stroke risk factors,  4. Has had vertigo with UTIs in past, but urine appears significantly improved since last admit 4. UTI - 1. Will switch treatment from levaquin to rocephin during admission.  (K.Pneumo sensitive to both). 2. Repeat UA today appears significantly improved compared to last admit. 5. Renal cancer mets to liver - 1. Following with onc 2. MRI brain as above to look for further mets 6. DM2 - 1. Sensitive SSI AC  DVT prophylaxis: Lovenox Code Status: Full Family Communication: Daughter at bedside Disposition Plan: Home after admit Consults called: None Admission status: Place in [place], [place] Hospital[facility] Pager [phone]  If 7AM-7PM, please contact day team taking care of patient [email or web address] Password [facility]  01/28/2017, 11:29 PM

## 2017-01-28 NOTE — ED Notes (Signed)
X-ray at bedside

## 2017-01-28 NOTE — ED Provider Notes (Signed)
[facility] EMERGENCY DEPARTMENT Provider Note   CSN: [id number] Arrival date & time: 01/28/17  1649     History   Chief Complaint Chief Complaint  Patient presents with  . Dizziness    HPI Joann Hunter is a 84 y.o. female kidney cancer with liver metastases, CKD,  CAD, MI,  presents today for evaluation of dizziness that waxes and wanes since December.  She is currently being treated for a UTI with levoquin, was released from [facility] 3 days ago after being admitted for this.  Patient's daughter is with her, reports that today patient's dizziness is severe enough that she is unable to safely walk to the bathroom.  2 days ago she was seen by oncology and given 1 unit blood transfusion for presumed symptomatic anemia.  She has passed out from this before, however not since her last discharge.  She reports that it is worse with any position change and made better by being still.  She reports that it feels like things are moving and she is not.     Chart review shows that she had a CT head back in December when all of this started, has not had a head MRI.  HPI  Past Medical History:  Diagnosis Date  . Arthritis   . Back pain, chronic   . CAD (coronary artery disease)    NSTEMI 04/2011 with newly diagnosed 3V CAD s/p PCI/DES mLAD 04/11/11 with residual dz (per Dr. Burt Knack, should have consideration of PCI of the occluded RCA based on symptoms and/or consideration of outpatient nuclear perfusion testing after the patient recovers from her infarct)  . Cancer ([place])    kidney ( Nov.2017)  . Chronic kidney disease (CKD), stage IV (severe) ([place]) 12/13/2013  . Hyperglycemia   . Hyperlipidemia   . Hypertension   . Hypothyroidism   . Ischemic cardiomyopathy    EF 45% by cath 04/20/11, 50-55% by echo 04/22/11. hypotension limiting medication titration   . Myocardial infarction ([place]) 2013  . Type 2 diabetes with nephropathy ([place]) 07/26/2014  . UTI (urinary tract infection)  12/13/2013    Patient Active Problem List   Diagnosis Date Noted  . Pressure injury of skin 01/23/2017  . Pulmonary edema 01/21/2017  . Pain and swelling of left lower leg 01/21/2017  . Hypokalemia 06/16/2016  . Hip fracture, unspecified laterality, closed, initial encounter ([place]) 04/28/2016  . Ischemic cardiomyopathy 04/28/2016  . Hyperlipidemia 04/28/2016  . Cancer ([place]) 04/28/2016  . Hip fracture ([place]) 04/28/2016  . Dehydration 04/14/2016  . Orthostasis 02/24/2016  . Acute encephalopathy 02/24/2016  . Confusion 02/21/2016  . Neutropenia ([place]) 02/20/2016  . Thrombocytopenia ([place]) 02/20/2016  . ESBL (extended spectrum beta-lactamase) producing bacteria infection 02/20/2016  . Goals of care, counseling/discussion 02/02/2016  . Urothelial cancer ([place]) 01/01/2016  . Rectal bleeding 11/05/2014  . Absolute anemia 11/05/2014  . Hypothyroidism 11/05/2014  . Metabolic encephalopathy 62/94/7654  . Type 2 diabetes with nephropathy ([place]) 07/26/2014  . Essential hypertension 07/26/2014  . Hyponatremia 07/25/2014  . Vertigo 07/25/2014  . Acute kidney injury superimposed on chronic kidney disease ([place]) 12/13/2013  . Fall 12/13/2013  . Lower urinary tract infectious disease 12/13/2013  . Chronic kidney disease (CKD), stage IV (severe) ([place]) 12/13/2013  . Chest pain at rest 04/26/2011  . Syncope, near 04/26/2011  . CAD (coronary artery disease) 04/23/2011  . Cardiomyopathy, ischemic 04/23/2011  . Dyslipidemia 04/23/2011  . Myocardial infarction, anterior wall, initial care [facility]) 04/20/2011    Past Surgical  History:  Procedure Laterality Date  . BACK SURGERY    . BREAST LUMPECTOMY     right  . CORONARY STENT PLACEMENT    . HIP ARTHROPLASTY Right 04/29/2016   Procedure: ARTHROPLASTY BIPOLAR HIP (HEMIARTHROPLASTY);  Surgeon: Carole Civil, MD;  Location: [facility]S;  Service: Orthopedics;  Laterality: Right;  . IR FLUORO GUIDE PORT INSERTION RIGHT  01/13/2017  . IR US GUIDE VASC  ACCESS RIGHT  01/13/2017  . LEFT HEART CATHETERIZATION WITH CORONARY ANGIOGRAM N/A 04/20/2011   Procedure: LEFT HEART CATHETERIZATION WITH CORONARY ANGIOGRAM;  Surgeon: Burnell Blanks, MD;  Location: [facility] CATH LAB;  Service: Cardiovascular;  Laterality: N/A;  . PERCUTANEOUS CORONARY STENT INTERVENTION (PCI-S) N/A 04/21/2011   Procedure: PERCUTANEOUS CORONARY STENT INTERVENTION (PCI-S);  Surgeon: Sherren Mocha, MD;  Location: [facility] CATH LAB;  Service: Cardiovascular;  Laterality: N/A;    OB History    Gravida Para Term Preterm AB Living   2 2 2     2    SAB TAB Ectopic Multiple Live Births                   Home Medications    Prior to Admission medications   Medication Sig Start Date End Date Taking? Authorizing Provider  amLODipine (NORVASC) 5 MG tablet Take 1 tablet (5 mg total) by mouth daily. 05/03/16  Yes Elgergawy, Silver Huguenin, MD  aspirin EC 81 MG tablet Take 81 mg by mouth daily.   Yes [provider]  feeding supplement, ENSURE ENLIVE, (ENSURE ENLIVE) LIQD Take 237 mLs by mouth 2 (two) times daily between meals. 05/02/16  Yes Elgergawy, Silver Huguenin, MD  fish oil-omega-3 fatty acids 1000 MG capsule Take 1 g by mouth daily.   Yes [provider]  Gemcitabine HCl (GEMZAR IV) Inject into the vein. Day 1,8,15 every 28 days   Yes [provider]  HYDROcodone-acetaminophen (NORCO/VICODIN) 5-325 MG tablet Take 1 tablet by mouth every 6 (six) hours as needed for moderate pain. 06/03/16  Yes Carole Civil, MD  levothyroxine (SYNTHROID, LEVOTHROID) 112 MCG tablet Take 1 tablet (112 mcg total) by mouth daily before breakfast. 09/08/16  Yes Holley Bouche, NP  lidocaine-prilocaine (EMLA) cream Apply a quarter size amount to affected area 1 hour prior to coming to chemotherapy.  Do not rub in.  Cover with plastic wrap. 01/09/17  Yes Jacquelin Hawking, NP  meclizine (ANTIVERT) 25 MG tablet Take 25 mg by mouth 4 (four) times daily as needed. Dizziness   Yes [provider]  metoprolol tartrate (LOPRESSOR) 25 MG tablet Take 1 tablet (25 mg total) by mouth 2 (two) times daily. Patient taking differently: Take 12.5 mg by mouth 2 (two) times daily.  05/02/16  Yes Elgergawy, Silver Huguenin, MD  nitroGLYCERIN (NITROSTAT) 0.4 MG SL tablet Place 0.4 mg under the tongue every 5 (five) minutes x 3 doses as needed. For chest pain. 04/23/11  Yes Dunn, Dayna N, PA-C  prochlorperazine (COMPAZINE) 10 MG tablet Take 1 tablet (10 mg total) by mouth every 6 (six) hours as needed for nausea or vomiting. 01/29/16  Yes Penland, Kelby Fam, MD  promethazine (PHENERGAN) 25 MG tablet Take 25 mg by mouth every 6 (six) hours as needed for nausea or vomiting.   Yes [provider]  Vitamin D, Ergocalciferol, (DRISDOL) 50000 UNITS CAPS Take 50,000 Units by mouth 2 (two) times a week. Tuesdays and Thurdays 05/30/12  Yes [provider]    Family History Family History  Problem  Relation Age of Onset  . Other Mother   . Cancer Father   . Other Unknown        no known family cardiac disease  . Other Brother        Diptheria  . Depression Sister   . Pneumonia Sister   . Other Brother        murdered    Social History Social History   Tobacco Use  . Smoking status: Never Smoker  . Smokeless tobacco: Never Used  Substance Use Topics  . Alcohol use: No  . Drug use: No     Allergies   Ciprofloxacin; Nitrofurantoin; Lipitor [atorvastatin]; and Penicillins   Review of Systems Review of Systems  Constitutional: Negative for chills and fever.  HENT: Negative for ear discharge and ear pain.   Eyes: Negative for visual disturbance.  Respiratory: Negative for shortness of breath.   Cardiovascular: Negative for chest pain.  Gastrointestinal: Negative for nausea and vomiting.  Endocrine: Negative for polydipsia and polyuria.  Skin: Negative for rash.  Neurological: Positive for dizziness, syncope and light-headedness. Negative for facial asymmetry and  headaches.  Psychiatric/Behavioral: Negative for confusion.  All other systems reviewed and are negative.    Physical Exam Updated Vital Signs BP 121/61   Pulse 64   Temp 98 F (36.7 C) (Oral)   Resp 18   Ht 5\' 2"  (1.575 m)   Wt 76.7 kg (169 lb)   SpO2 95%   BMI 30.91 kg/m   Physical Exam  Constitutional: She is oriented to person, place, and time. She appears well-developed and well-nourished. No distress.  HENT:  Head: Normocephalic and atraumatic.  Mouth/Throat: Oropharynx is clear and moist.  Eyes: Conjunctivae and EOM are normal. Pupils are equal, round, and reactive to light.  No nystagmus with position changes or tracking.   Neck: Normal range of motion. Neck supple.  Cardiovascular: Normal rate, regular rhythm and normal heart sounds.  No murmur heard. Pulmonary/Chest: Effort normal and breath sounds normal. No respiratory distress.  Abdominal: Soft. She exhibits no distension. There is no tenderness.  Musculoskeletal: She exhibits no edema.  Neurological: She is alert and oriented to person, place, and time.  Mental Status:  Alert, oriented, thought content appropriate, able to give a coherent history. Speech fluent without evidence of aphasia. Able to follow 2 step commands without difficulty.  Cranial Nerves:  II:  Peripheral visual fields grossly normal, pupils equal, round, reactive to light III,IV, VI: ptosis not present, extra-ocular motions intact bilaterally  V,VII: smile symmetric, facial light touch sensation equal VIII: hearing grossly normal to voice  X: uvula elevates symmetrically  XI: bilateral shoulder shrug symmetric and strong XII: midline tongue extension without fassiculations Motor:  Normal tone. 5/5 in upper and lower extremities bilaterally including strong and equal grip strength and dorsiflexion/plantar flexion Cerebellar: normal finger-to-nose with bilateral upper extremities Gait: Patient unable to walk  CV: distal pulses palpable  throughout    Skin: Skin is warm and dry.  Psychiatric: She has a normal mood and affect. Her behavior is normal.  Nursing note and vitals reviewed.    ED Treatments / Results  Labs (all labs ordered are listed, but only abnormal results are displayed) Labs Reviewed  CBC WITH DIFFERENTIAL/PLATELET - Abnormal; Notable for the following components:      Result Value   RBC 3.78 (*)    Hemoglobin 11.8 (*)    HCT 35.2 (*)    All other components within normal limits  COMPREHENSIVE METABOLIC  PANEL - Abnormal; Notable for the following components:   Sodium 125 (*)    Chloride 85 (*)    Glucose, Bld 168 (*)    BUN 24 (*)    Creatinine, Ser 2.66 (*)    Calcium 8.4 (*)    Total Protein 6.1 (*)    Albumin 2.6 (*)    ALT 12 (*)    GFR calc non Af Amer 16 (*)    GFR calc Af Amer 18 (*)    All other components within normal limits  URINALYSIS, ROUTINE W REFLEX MICROSCOPIC - Abnormal; Notable for the following components:   APPearance HAZY (*)    Leukocytes, UA TRACE (*)    Squamous Epithelial / LPF 6-30 (*)    All other components within normal limits  NA AND K (SODIUM & POTASSIUM), RAND UR  PROTIME-INR  OSMOLALITY, URINE  BRAIN NATRIURETIC PEPTIDE  BASIC METABOLIC PANEL  CBG MONITORING, ED  I-STAT TROPONIN, ED  TYPE AND SCREEN    EKG  EKG Interpretation  Date/Time:  Saturday January 28 2017 21:19:48 EST Ventricular Rate:  61 PR Interval:    QRS Duration: 92 QT Interval:  462 QTC Calculation: 466 R Axis:   19 Text Interpretation:  Sinus rhythm Ventricular premature complex Borderline prolonged PR interval Low voltage, extremity and precordial leads No significant change since last tracing Confirmed by Zenovia Jarred [phone]) on 01/28/2017 9:25:02 PM       Radiology Dg Chest Port 1 View  Result Date: 01/28/2017 CLINICAL DATA:  Hyponatremia. EXAM: PORTABLE CHEST 1 VIEW COMPARISON:  01/21/2017 FINDINGS: The heart size and mediastinal contours are within normal limits.  Aortic atherosclerosis. Both lungs are clear. Right-sided power port remains in appropriate position. IMPRESSION: No active disease. Electronically Signed   By: Earle Gell M.D.   On: 01/28/2017 23:24    Procedures Procedures (including critical care time)  Medications Ordered in ED Medications                            0.9 %  sodium chloride infusion (not administered)               Initial Impression / Assessment and Plan / ED Course  I have reviewed the triage vital signs and the nursing notes.  Pertinent labs & imaging results that were available during my care of the patient were reviewed by me and considered in my medical decision making (see chart for details).  Clinical Course as of Jan 29 33  Sat Jan 28, 2017  2233 Spoke with Dr. Alcario Drought who will come see patient.   [EH]    Clinical Course User Index [EH] Lorin Glass, PA-C   Joann Hunter presents today for evaluation of dizziness since the middle of December.  She was found to be hyponatremic with a sodium of 125, Cr. Elevated to 2.66, BUN 24, Chloride low at 85.  Urinalysis was contaminated, from purwick catch, hazy with trace leukocytes and budding yeast, she is being treated for UTI currently.  Her hemoglobin stable at 11.8 after recent blood transfusion.  She had a CT head when her symptoms started with out acute abnormalities, not repeated.  EKG obtained with out acute abnormalities.  Suspect that her feelings of dizziness is related to her electrolyte abnormalities and orthostatic changes.  Given her multiple work ups for the same thing I do not feel like additional work up in the emergency room is needed.  Hospitalist consulted for admission who agreed to admit patient.    Final Clinical Impressions(s) / ED Diagnoses   Final diagnoses:  Hyponatremia    ED Discharge Orders    None       Lorin Glass, PA-C 01/29/17 0036    Macarthur Critchley, MD 01/31/17 1501

## 2017-01-28 NOTE — Progress Notes (Signed)
Pharmacy Antibiotic Note  Joann Hunter is a 84 y.o. female admitted on 01/28/2017 with UTI.  Pharmacy has been consulted for Rocephin dosing.  Pt had been on Levaquin PTA.  Plan: Rocephin 1g IV Q24H. Pharmacy will sign off.  Height: 5\' 2"  (157.5 cm) Weight: 169 lb (76.7 kg) IBW/kg (Calculated) : 50.1  Temp (24hrs), Avg:98.1 F (36.7 C), Min:98 F (36.7 C), Max:98.2 F (36.8 C)  Recent Labs  Lab 01/22/17 0511 01/23/17 0509 01/24/17 0713 01/25/17 0520 01/26/17 1030 01/28/17 1730  WBC  --  3.7*  --   --  7.2 6.4  CREATININE 2.37* 2.17* 2.05* 1.91*  --  2.66*    Estimated Creatinine Clearance: 15.6 mL/min (A) (by C-G formula based on SCr of 2.66 mg/dL (H)).    Allergies  Allergen Reactions  . Ciprofloxacin Other (See Comments)    Hallucination  . Nitrofurantoin Other (See Comments)    Dizziness: Resulting in a fall  . Lipitor [Atorvastatin] Other (See Comments)    Muscle Weakness  . Penicillins Rash    Has patient had a PCN reaction causing immediate rash, facial/tongue/throat swelling, SOB or lightheadedness with hypotension: unknown Has patient had a PCN reaction causing severe rash involving mucus membranes or skin necrosis: unknown Has patient had a PCN reaction that required hospitalization: unknown Has patient had a PCN reaction occurring within the last 10 years: unknown If all of the above answers are "NO", then may proceed with Cephalosporin use. 2     Thank you for allowing pharmacy to be a part of this patient's care.  Wynona Neat, PharmD, BCPS  01/28/2017 11:03 PM

## 2017-01-28 NOTE — ED Triage Notes (Addendum)
Pt's daughter st's pas has been dizzy.  Was dx with vertigo years ago.  Pt's daughter st's pt is unable to walk due to dizziness. Pt is also diabetic and sugar has been running high.  Pt denies any pain st's  I'am just dizzy

## 2017-01-29 ENCOUNTER — Observation Stay (HOSPITAL_COMMUNITY): Payer: Medicare Other

## 2017-01-29 ENCOUNTER — Other Ambulatory Visit: Payer: Self-pay

## 2017-01-29 DIAGNOSIS — E785 Hyperlipidemia, unspecified: Secondary | ICD-10-CM | POA: Diagnosis present

## 2017-01-29 DIAGNOSIS — N179 Acute kidney failure, unspecified: Secondary | ICD-10-CM | POA: Diagnosis present

## 2017-01-29 DIAGNOSIS — D638 Anemia in other chronic diseases classified elsewhere: Secondary | ICD-10-CM | POA: Diagnosis present

## 2017-01-29 DIAGNOSIS — I951 Orthostatic hypotension: Secondary | ICD-10-CM | POA: Diagnosis present

## 2017-01-29 DIAGNOSIS — D631 Anemia in chronic kidney disease: Secondary | ICD-10-CM | POA: Diagnosis present

## 2017-01-29 DIAGNOSIS — R42 Dizziness and giddiness: Secondary | ICD-10-CM | POA: Diagnosis present

## 2017-01-29 DIAGNOSIS — I129 Hypertensive chronic kidney disease with stage 1 through stage 4 chronic kidney disease, or unspecified chronic kidney disease: Secondary | ICD-10-CM | POA: Diagnosis present

## 2017-01-29 DIAGNOSIS — N189 Chronic kidney disease, unspecified: Secondary | ICD-10-CM | POA: Diagnosis not present

## 2017-01-29 DIAGNOSIS — C787 Secondary malignant neoplasm of liver and intrahepatic bile duct: Secondary | ICD-10-CM | POA: Diagnosis present

## 2017-01-29 DIAGNOSIS — E871 Hypo-osmolality and hyponatremia: Secondary | ICD-10-CM | POA: Diagnosis present

## 2017-01-29 DIAGNOSIS — Z79899 Other long term (current) drug therapy: Secondary | ICD-10-CM | POA: Diagnosis not present

## 2017-01-29 DIAGNOSIS — E876 Hypokalemia: Secondary | ICD-10-CM | POA: Diagnosis present

## 2017-01-29 DIAGNOSIS — E039 Hypothyroidism, unspecified: Secondary | ICD-10-CM | POA: Diagnosis present

## 2017-01-29 DIAGNOSIS — I252 Old myocardial infarction: Secondary | ICD-10-CM | POA: Diagnosis not present

## 2017-01-29 DIAGNOSIS — Z955 Presence of coronary angioplasty implant and graft: Secondary | ICD-10-CM | POA: Diagnosis not present

## 2017-01-29 DIAGNOSIS — E1121 Type 2 diabetes mellitus with diabetic nephropathy: Secondary | ICD-10-CM | POA: Diagnosis present

## 2017-01-29 DIAGNOSIS — Z96641 Presence of right artificial hip joint: Secondary | ICD-10-CM | POA: Diagnosis present

## 2017-01-29 DIAGNOSIS — I5032 Chronic diastolic (congestive) heart failure: Secondary | ICD-10-CM | POA: Diagnosis present

## 2017-01-29 DIAGNOSIS — E1122 Type 2 diabetes mellitus with diabetic chronic kidney disease: Secondary | ICD-10-CM | POA: Diagnosis present

## 2017-01-29 DIAGNOSIS — I255 Ischemic cardiomyopathy: Secondary | ICD-10-CM | POA: Diagnosis present

## 2017-01-29 DIAGNOSIS — N184 Chronic kidney disease, stage 4 (severe): Secondary | ICD-10-CM | POA: Diagnosis present

## 2017-01-29 DIAGNOSIS — I130 Hypertensive heart and chronic kidney disease with heart failure and stage 1 through stage 4 chronic kidney disease, or unspecified chronic kidney disease: Secondary | ICD-10-CM | POA: Diagnosis present

## 2017-01-29 DIAGNOSIS — I2510 Atherosclerotic heart disease of native coronary artery without angina pectoris: Secondary | ICD-10-CM | POA: Diagnosis present

## 2017-01-29 DIAGNOSIS — C649 Malignant neoplasm of unspecified kidney, except renal pelvis: Secondary | ICD-10-CM | POA: Diagnosis present

## 2017-01-29 LAB — PROTIME-INR
INR: 1.07
Prothrombin Time: 13.8 seconds (ref 11.4–15.2)

## 2017-01-29 LAB — BRAIN NATRIURETIC PEPTIDE: B NATRIURETIC PEPTIDE 5: 67.1 pg/mL (ref 0.0–100.0)

## 2017-01-29 LAB — GLUCOSE, CAPILLARY
GLUCOSE-CAPILLARY: 153 mg/dL — AB (ref 65–99)
Glucose-Capillary: 216 mg/dL — ABNORMAL HIGH (ref 65–99)

## 2017-01-29 LAB — BASIC METABOLIC PANEL
Anion gap: 13 (ref 5–15)
BUN: 23 mg/dL — ABNORMAL HIGH (ref 6–20)
CHLORIDE: 86 mmol/L — AB (ref 101–111)
CO2: 26 mmol/L (ref 22–32)
Calcium: 8 mg/dL — ABNORMAL LOW (ref 8.9–10.3)
Creatinine, Ser: 2.29 mg/dL — ABNORMAL HIGH (ref 0.44–1.00)
GFR calc Af Amer: 22 mL/min — ABNORMAL LOW (ref >60)
GFR calc non Af Amer: 19 mL/min — ABNORMAL LOW (ref >60)
GLUCOSE: 181 mg/dL — AB (ref 65–99)
POTASSIUM: 3.1 mmol/L — AB (ref 3.5–5.1)
Sodium: 125 mmol/L — ABNORMAL LOW (ref 135–145)

## 2017-01-29 LAB — TYPE AND SCREEN
ABO/RH(D): O NEG
Antibody Screen: NEGATIVE

## 2017-01-29 LAB — CBG MONITORING, ED
GLUCOSE-CAPILLARY: 133 mg/dL — AB (ref 65–99)
GLUCOSE-CAPILLARY: 186 mg/dL — AB (ref 65–99)

## 2017-01-29 LAB — ABO/RH: ABO/RH(D): O NEG

## 2017-01-29 LAB — I-STAT TROPONIN, ED: TROPONIN I, POC: 0 ng/mL (ref 0.00–0.08)

## 2017-01-29 LAB — OSMOLALITY, URINE: OSMOLALITY UR: 408 mosm/kg (ref 300–900)

## 2017-01-29 MED ORDER — POTASSIUM CHLORIDE CRYS ER 10 MEQ PO TBCR
30.0000 meq | EXTENDED_RELEASE_TABLET | Freq: Two times a day (BID) | ORAL | Status: AC
  Administered 2017-01-29 (×2): 30 meq via ORAL
  Filled 2017-01-29 (×2): qty 1

## 2017-01-29 MED ORDER — INSULIN ASPART 100 UNIT/ML ~~LOC~~ SOLN
0.0000 [IU] | Freq: Three times a day (TID) | SUBCUTANEOUS | Status: DC
  Administered 2017-01-29 – 2017-01-30 (×2): 3 [IU] via SUBCUTANEOUS
  Administered 2017-01-30: 2 [IU] via SUBCUTANEOUS
  Administered 2017-01-31: 1 [IU] via SUBCUTANEOUS
  Administered 2017-01-31 (×2): 2 [IU] via SUBCUTANEOUS
  Administered 2017-02-01: 3 [IU] via SUBCUTANEOUS
  Administered 2017-02-01: 1 [IU] via SUBCUTANEOUS
  Administered 2017-02-02: 2 [IU] via SUBCUTANEOUS
  Administered 2017-02-02: 1 [IU] via SUBCUTANEOUS
  Administered 2017-02-02: 2 [IU] via SUBCUTANEOUS
  Administered 2017-02-03: 1 [IU] via SUBCUTANEOUS

## 2017-01-29 MED ORDER — SODIUM CHLORIDE 0.9% FLUSH
10.0000 mL | INTRAVENOUS | Status: DC | PRN
  Administered 2017-02-02 – 2017-02-03 (×2): 10 mL
  Filled 2017-01-29 (×2): qty 40

## 2017-01-29 NOTE — Care Management Obs Status (Signed)
[place] NOTIFICATION   Patient Details  Name: Joann Hunter MRN: [id number] Date of Birth: 1932-04-08   Medicare Observation Status Notification Given:  Ernesta Amble, RN 01/29/2017, 3:07 PM

## 2017-01-29 NOTE — Assessment & Plan Note (Signed)
84 y.o.  female with diagnosis of metastatic urothelial carcinoma, progressive on immunotherapy with atezolizumab (Tecentriq).  Presenting to initiate palliative systemic chemotherapy with gemcitabine administered on day 1, 8, 15 of a 28-day cycle.  Clinical evaluation lab work today permissive to proceed with treatment as scheduled.  Chemotherapy medication, schedule, and potential side effects have been reviewed with patient in detail.  Plan: -Proceed with gemcitabine 1000 mg/m today -Return to clinic in 1 week for toxicity monitoring possible day 8 of cycle 1 of chemotherapy. -Disease assessment with CT of the chest/abdomen/pelvis following 2-3 cycles of systemic therapy

## 2017-01-29 NOTE — ED Notes (Signed)
Pt in MRI.

## 2017-01-29 NOTE — ED Notes (Signed)
Ordered breakfast tray  

## 2017-01-29 NOTE — Progress Notes (Signed)
Paged MD for Na+ 125, no new orders

## 2017-01-29 NOTE — Progress Notes (Signed)
PROGRESS NOTE  Joann Hunter  DPO:[id number] DOB: 10/06/1932  DOA: 01/28/2017 PCP: Monico Blitz, MD   Brief Narrative:  Joann Hunter is a 84 y.o. female with medical history significant of stage IV chronic kidney disease, ischemic cardiomyopathy, type 2 diabetes mellitus and ureteral failure cancer stage IV with liver metastases currently undergoing chemotherapy, with recurrent hospital evaluations on 01/19/2017 and hospitalized from 01/21/2017 through [id number] for pulmonary edema presenting with generalized weakness with vertigo admitted with electrolyte imbalance with acute on chronic kidney injury with UTI    Assessment & Plan:   Principal Problem:   Hyponatremia Active Problems:   Acute kidney injury superimposed on chronic kidney disease ([place])   Chronic kidney disease (CKD), stage IV (severe) ([facility])   Vertigo   Type 2 diabetes with nephropathy ([place])   Urothelial cancer ([place])  #1 acute on chronic kidney injury: Etiology unclear Patient had missed dose of torsemide after recent discharge for pulmonary edema\ ?  Cardiorenal Supportive care with monitoring of renal function and electrolytes Consider nephrology consult if no better   #2 hyponatremia: Worsening from prior possibly due to missing diuretics on discharge Resume  torsemide and follow clinically   #3 UTI: Antibiotic Follow urine culture  #4 vertigo:  history of previous vertigo with UTIs Follow-up MRI ordered from admission Supportive care  #5 diabetes mellitus: Insulin scale insulin      DVT prophylaxis: Lovenox Code Status: Full code Family Communication: Disposition Plan: To be determined   Consultants:     Procedures:     Antimicrobials:   Rocephin   Subjective: No acute events reported overnight.  No fever chills resting comfortably, no distress  Objective:  Vitals:   01/29/17 0545 01/29/17 0615 01/29/17 0630 01/29/17 0730  BP: (!) 127/53 (!) 130/51 (!) 113/53 (!) 150/56    Pulse: 66 64 66 65  Resp: 14 15 14 11   Temp:      TempSrc:      SpO2: 93% 94% 93% 93%  Weight:      Height:        Intake/Output Summary (Last 24 hours) at 01/29/2017 0943 Last data filed at 01/29/2017 0216 Gross per 24 hour  Intake 50 ml  Output -  Net 50 ml   Filed Weights   01/28/17 1710 01/28/17 1716  Weight: 76.7 kg (169 lb) 76.7 kg (169 lb)    Examination:  General exam: No acute distress Respiratory system: Clear to auscultation. Respiratory effort normal. Cardiovascular system: S1 & S2 heard, RRR. No JVD, murmurs, rubs, gallops or clicks. No pedal edema. Gastrointestinal system: Abdomen is nondistended, soft and nontender. No organomegaly or masses felt. Normal bowel sounds heard. Central nervous system: Alert and oriented. No focal neurological deficits. Extremities: Symmetric 5 x 5 power. Skin: No rashes, lesions or ulcers Psychiatry: Judgement and insight appear normal. Mood & affect appropriate.     Data Reviewed: I have personally reviewed following labs and imaging studies  CBC: Recent Labs  Lab 01/23/17 0509 01/26/17 1030 01/28/17 1730  WBC 3.7* 7.2 6.4  NEUTROABS  --  4.7 3.8  HGB 7.9* 10.5* 11.8*  HCT 24.0* 32.6* 35.2*  MCV 96.0 98.5 93.1  PLT 121* 189 431   Basic Metabolic Panel: Recent Labs  Lab 01/23/17 0509 01/24/17 0713 01/25/17 0520 01/28/17 1730 01/29/17 0545  NA 128* 131* 132* 125* 125*  K 4.1 4.0 4.0 3.6 3.1*  CL 95* 91* 88* 85* 86*  CO2 25 27 32 29 26  GLUCOSE 168* 173* 187*  168* 181*  BUN 28* 25* 23* 24* 23*  CREATININE 2.17* 2.05* 1.91* 2.66* 2.29*  CALCIUM 8.4* 8.6* 8.9 8.4* 8.0*   GFR: Estimated Creatinine Clearance: 18.1 mL/min (A) (by C-G formula based on SCr of 2.29 mg/dL (H)). Liver Function Tests: Recent Labs  Lab 01/28/17 1730  AST 32  ALT 12*  ALKPHOS 79  BILITOT 0.8  PROT 6.1*  ALBUMIN 2.6*   No results for input(s): LIPASE, AMYLASE in the last 168 hours. No results for input(s): AMMONIA in the last  168 hours. Coagulation Profile: Recent Labs  Lab 01/28/17 2350  INR 1.07   Cardiac Enzymes: No results for input(s): CKTOTAL, CKMB, CKMBINDEX, TROPONINI in the last 168 hours. BNP (last 3 results) No results for input(s): PROBNP in the last 8760 hours. HbA1C: No results for input(s): HGBA1C in the last 72 hours. CBG: Recent Labs  Lab 01/24/17 1655 01/24/17 2100 01/25/17 0729 01/25/17 1110 01/29/17 0829  GLUCAP 242* 152* 160* 199* 133*   Lipid Profile: No results for input(s): CHOL, HDL, LDLCALC, TRIG, CHOLHDL, LDLDIRECT in the last 72 hours. Thyroid Function Tests: No results for input(s): TSH, T4TOTAL, FREET4, T3FREE, THYROIDAB in the last 72 hours. Anemia Panel: No results for input(s): VITAMINB12, FOLATE, FERRITIN, TIBC, IRON, RETICCTPCT in the last 72 hours.  Sepsis Labs: Recent Labs  Lab 01/23/17 0509 01/26/17 1030 01/28/17 1730  WBC 3.7* 7.2 6.4    No results found for this or any previous visit (from the past 240 hour(s)).       Radiology Studies: Mr Brain Wo Contrast  Result Date: 01/29/2017 CLINICAL DATA:  Vertigo EXAM: MRI HEAD WITHOUT CONTRAST TECHNIQUE: Multiplanar, multiecho pulse sequences of the brain and surrounding structures were obtained without intravenous contrast. COMPARISON:  Head CT 01/15/2017 FINDINGS: Brain: The midline structures are normal. There is no acute infarct or acute hemorrhage. No mass lesion, hydrocephalus, dural abnormality or extra-axial collection. There is multifocal white matter hyperintensity suggesting chronic ischemic microangiopathy. No age-advanced or lobar predominant atrophy. No chronic microhemorrhage or superficial siderosis. Vascular: Major intracranial arterial and venous sinus flow voids are preserved. Skull and upper cervical spine: The visualized skull base, calvarium, upper cervical spine and extracranial soft tissues are normal. Sinuses/Orbits: No fluid levels or advanced mucosal thickening. No mastoid or  middle ear effusion. Normal orbits. IMPRESSION: Chronic ischemic microangiopathy without acute intracranial abnormality. Electronically Signed   By: Ulyses Jarred M.D.   On: 01/29/2017 03:58   Dg Chest Port 1 View  Result Date: 01/28/2017 CLINICAL DATA:  Hyponatremia. EXAM: PORTABLE CHEST 1 VIEW COMPARISON:  01/21/2017 FINDINGS: The heart size and mediastinal contours are within normal limits. Aortic atherosclerosis. Both lungs are clear. Right-sided power port remains in appropriate position. IMPRESSION: No active disease. Electronically Signed   By: Earle Gell M.D.   On: 01/28/2017 23:24        Scheduled Meds: . amLODipine  5 mg Oral Daily  . aspirin EC  81 mg Oral Daily  . feeding supplement (ENSURE ENLIVE)  237 mL Oral BID BM  . heparin  5,000 Units Subcutaneous Q8H  . insulin aspart  0-9 Units Subcutaneous TID WC  . levothyroxine  112 mcg Oral QAC breakfast  . metoprolol tartrate  12.5 mg Oral BID  . sodium chloride flush  3 mL Intravenous Q12H  . torsemide  40 mg Oral Daily   Continuous Infusions: . sodium chloride    . cefTRIAXone (ROCEPHIN)  IV       LOS: 0 days  Time spent: 37    [email or web address],Amedee Cerrone, MD [facility] Pager [phone]  If 7PM-7AM, please contact night-coverage [email or web address] Password [facility] 01/29/2017, 9:43 AM

## 2017-01-29 NOTE — Progress Notes (Signed)
[place] Cancer Follow-up Visit:  Assessment: Urothelial cancer [facility]) 84 y.o.  female with diagnosis of metastatic urothelial carcinoma, progressive on immunotherapy with atezolizumab (Tecentriq).  Presenting to initiate palliative systemic chemotherapy with gemcitabine administered on day 1, 8, 15 of a 28-day cycle.  Clinical evaluation lab work today permissive to proceed with treatment as scheduled.  Chemotherapy medication, schedule, and potential side effects have been reviewed with patient in detail.  Plan: -Proceed with gemcitabine 1000 mg/m today -Return to clinic in 1 week for toxicity monitoring possible day 8 of cycle 1 of chemotherapy. -Disease assessment with CT of the chest/abdomen/pelvis following 2-3 cycles of systemic therapy  Voice recognition software was used and creation of this note. Despite my best effort at editing the text, some misspelling/errors may have occurred.  Orders Placed This Encounter  Procedures  . CBC with Differential    Standing Status:   Future    Standing Expiration Date:   01/16/2018  . Comprehensive metabolic panel    Standing Status:   Future    Standing Expiration Date:   01/16/2018  . Magnesium    Standing Status:   Future    Standing Expiration Date:   01/16/2018    Cancer Staging Urothelial cancer [facility]) Staging form: Kidney, AJCC 7th Edition - Clinical: Stage IV (M1) - Unsigned   All questions were answered. . The patient knows to call the clinic with any problems, questions or concerns.  This note was electronically signed.    History of Presenting Illness Joann Hunter 84 y.o. presenting to the [place] for initiation of systemic palliative chemotherapy with gemcitabine for diagnosis of metastatic transitional cell carcinoma.  This is the second line of systemic therapy.  Patient demonstrated progressive disease while receiving systemic immunotherapy.  Patient denies any new complaints since the last  visit to the clinic.  Oncological/hematological History:   Urothelial cancer ([place])   10/22/2015 Imaging    Presented with recurrent UTI and hematuria. Underwent cystoscopy and retrograde evaluation with Dr. Exie Parody revealed multiple filling defects in distal (R) ureter. Marked irregularly of proximal (R) ureter concerning for malignancy. (+) hydronephrosis noted and tumor noted out of ureteral orifice.        11/19/2015 Imaging    MRI abdomen: 3.2 cm mass in segment 4B of left hepatic lobe, suspicious for liver metastasis. Moderate right hydronephrosis and diffuse renal parenchymal atrophy. Abnormal signal intensity throughout right renal collecting system and proximal right ureter, consistent with known urothelial carcinoma. No other sites of abdominal metastatic disease identified.      12/07/2015 Procedure    Liver biopsy: Poorly differentiated carcinoma consistent with metastatic transitional cell histology.       01/01/2016 Initial Diagnosis    Urothelial carcinoma ([place])      02/04/2016 - 01/16/2017 Chemotherapy    atezolizumab (TECENTRIQ) 1,200 mg IV every 21 days        06/09/2016 Imaging    CT C/A/P: Interval decrease in size of hepatic metastatic lesion.  Interval decrease in distention of the right renal collecting system and right ureter with central high attenuation material compatible with known primary urothelial malignancy.  Bilateral pulmonary nodules as above. Recommend attention on follow-up.  Subacute right-sided rib fractures.      09/06/2016 Imaging    CT C/A/P: 1. Overall improvement. The bulky tumor in the right collecting system and right proximal ureter is mildly reduced. Prior pulmonary nodules are no longer visible. Continued reduction in size of the segment 4b hepatic  metastatic lesion which also demonstrates reduced conspicuity. 2. Other imaging findings of potential clinical significance: Aortic Atherosclerosis (ICD10-I70.0). Healing response  from prior right posterior rib fractures. Hepatic cirrhosis. Gastric varices and splenorenal shunting indicating portal venous hypertension. Severe degenerative left hip arthropathy. Chronic compression fracture at L2. Bony demineralization.      12/23/2016 Imaging    CT C/A/P: Interval increase in size of tumoral expansion of the right renal collecting system. Additionally a tumor within the mid right ureter has increased in size when compared to prior. Slight interval increase in size of hepatic lesion, difficult to visualize without intravenous contrast material.      12/23/2016 Progression    Progression of right renal collecting system, tumor within the mid right ureter has increased in size when compared to prior and slight interval increase in size of hepatic lesion. Stop Tencentriq.        01/16/2017 -  Chemotherapy    The patient had gemcitabine (GEMZAR) 1,862 mg in sodium chloride 0.9 % 100 mL chemo infusion, 1,000 mg/m2, Intravenous,  Once, 0 of 6 cycles         Medical History: Past Medical History:  Diagnosis Date  . Arthritis   . Back pain, chronic   . CAD (coronary artery disease)    NSTEMI 04/2011 with newly diagnosed 3V CAD s/p PCI/DES mLAD 04/11/11 with residual dz (per Dr. Burt Knack, should have consideration of PCI of the occluded RCA based on symptoms and/or consideration of outpatient nuclear perfusion testing after the patient recovers from her infarct)  . Cancer ([place])    kidney ( Nov.2017)  . Chronic kidney disease (CKD), stage IV (severe) ([place]) 12/13/2013  . Hyperglycemia   . Hyperlipidemia   . Hypertension   . Hypothyroidism   . Ischemic cardiomyopathy    EF 45% by cath 04/20/11, 50-55% by echo 04/22/11. hypotension limiting medication titration   . Myocardial infarction ([place]) 2013  . Type 2 diabetes with nephropathy ([place]) 07/26/2014  . UTI (urinary tract infection) 12/13/2013    Surgical History: Past Surgical History:  Procedure Laterality Date  .  BACK SURGERY    . BREAST LUMPECTOMY     right  . CORONARY STENT PLACEMENT    . HIP ARTHROPLASTY Right 04/29/2016   Procedure: ARTHROPLASTY BIPOLAR HIP (HEMIARTHROPLASTY);  Surgeon: Carole Civil, MD;  Location: [facility];  Service: Orthopedics;  Laterality: Right;  . IR FLUORO GUIDE PORT INSERTION RIGHT  01/13/2017  . IR US GUIDE VASC ACCESS RIGHT  01/13/2017  . LEFT HEART CATHETERIZATION WITH CORONARY ANGIOGRAM N/A 04/20/2011   Procedure: LEFT HEART CATHETERIZATION WITH CORONARY ANGIOGRAM;  Surgeon: Burnell Blanks, MD;  Location[facility] CATH LAB;  Service: Cardiovascular;  Laterality: N/A;  . PERCUTANEOUS CORONARY STENT INTERVENTION (PCI-S) N/A 04/21/2011   Procedure: PERCUTANEOUS CORONARY STENT INTERVENTION (PCI-S);  Surgeon: Sherren Mocha, MD;  Location: [facility] CATH LAB;  Service: Cardiovascular;  Laterality: N/A;    Family History: Family History  Problem Relation Age of Onset  . Other Mother   . Cancer Father   . Other Unknown        no known family cardiac disease  . Other Brother        Diptheria  . Depression Sister   . Pneumonia Sister   . Other Brother        murdered    Social History: Social History   Socioeconomic History  . Marital status: Married    Spouse name: Not on file  . Number of children: Not  on file  . Years of education: Not on file  . Highest education level: Not on file  Social Needs  . Financial resource strain: Not on file  . Food insecurity - worry: Not on file  . Food insecurity - inability: Not on file  . Transportation needs - medical: Not on file  . Transportation needs - non-medical: Not on file  Occupational History  . Not on file  Tobacco Use  . Smoking status: Never Smoker  . Smokeless tobacco: Never Used  Substance and Sexual Activity  . Alcohol use: No  . Drug use: No  . Sexual activity: No    Birth control/protection: Post-menopausal    Comment: married 64 years-husband at [facility]  Other Topics Concern  . Not on file  Social  History Narrative   Lives in [place], [place] with her husband and daughter.    Allergies: Allergies  Allergen Reactions  . Ciprofloxacin Other (See Comments)    Hallucination  . Nitrofurantoin Other (See Comments)    Dizziness: Resulting in a fall  . Lipitor [Atorvastatin] Other (See Comments)    Muscle Weakness  . Penicillins Rash    Has patient had a PCN reaction causing immediate rash, facial/tongue/throat swelling, SOB or lightheadedness with hypotension: unknown Has patient had a PCN reaction causing severe rash involving mucus membranes or skin necrosis: unknown Has patient had a PCN reaction that required hospitalization: unknown Has patient had a PCN reaction occurring within the last 10 years: unknown If all of the above answers are "NO", then may proceed with Cephalosporin use. 2    Medications:  No current facility-administered medications for this visit.    Current Outpatient Medications  Medication Sig Dispense Refill  . amLODipine (NORVASC) 5 MG tablet Take 1 tablet (5 mg total) by mouth daily.    Marland Kitchen aspirin EC 81 MG tablet Take 81 mg by mouth daily.    . feeding supplement, ENSURE ENLIVE, (ENSURE ENLIVE) LIQD Take 237 mLs by mouth 2 (two) times daily between meals. 237 mL 12  . fish oil-omega-3 fatty acids 1000 MG capsule Take 1 g by mouth daily.    . Gemcitabine HCl (GEMZAR IV) Inject into the vein. Day 1,8,15 every 28 days    . HYDROcodone-acetaminophen (NORCO/VICODIN) 5-325 MG tablet Take 1 tablet by mouth every 6 (six) hours as needed for moderate pain. 56 tablet 0  . levothyroxine (SYNTHROID, LEVOTHROID) 112 MCG tablet Take 1 tablet (112 mcg total) by mouth daily before breakfast. 30 tablet 1  . lidocaine-prilocaine (EMLA) cream Apply a quarter size amount to affected area 1 hour prior to coming to chemotherapy.  Do not rub in.  Cover with plastic wrap. 30 g 2  . meclizine (ANTIVERT) 25 MG tablet Take 25 mg by mouth 4 (four) times daily as needed. Dizziness    .  metoprolol tartrate (LOPRESSOR) 25 MG tablet Take 1 tablet (25 mg total) by mouth 2 (two) times daily. (Patient taking differently: Take 12.5 mg by mouth 2 (two) times daily. ) 60 tablet 0  . nitroGLYCERIN (NITROSTAT) 0.4 MG SL tablet Place 0.4 mg under the tongue every 5 (five) minutes x 3 doses as needed. For chest pain.    Marland Kitchen prochlorperazine (COMPAZINE) 10 MG tablet Take 1 tablet (10 mg total) by mouth every 6 (six) hours as needed for nausea or vomiting. 30 tablet 2  . promethazine (PHENERGAN) 25 MG tablet Take 25 mg by mouth every 6 (six) hours as needed for nausea or vomiting.    Marland Kitchen  Vitamin D, Ergocalciferol, (DRISDOL) 50000 UNITS CAPS Take 50,000 Units by mouth 2 (two) times a week. Tuesdays and Thurdays     Facility-Administered Medications Ordered in Other Visits  Medication Dose Route Frequency Provider Last Rate Last Dose  . 0.9 %  sodium chloride infusion  250 mL Intravenous PRN Etta Quill, DO      . acetaminophen (TYLENOL) tablet 650 mg  650 mg Oral Q4H PRN Etta Quill, DO      . amLODipine (NORVASC) tablet 5 mg  5 mg Oral Daily Jennette Kettle M, DO   5 mg at 01/29/17 0936  . aspirin EC tablet 81 mg  81 mg Oral Daily Etta Quill, DO   81 mg at 01/29/17 0936  . cefTRIAXone (ROCEPHIN) 1 g in dextrose 5 % 50 mL IVPB  1 g Intravenous Q24H Laren Everts, RPH      . feeding supplement (ENSURE ENLIVE) (ENSURE ENLIVE) liquid 237 mL  237 mL Oral BID BM Etta Quill, DO   237 mL at 01/29/17 1049  . heparin injection 5,000 Units  5,000 Units Subcutaneous Q8H Jennette Kettle M, DO   5,000 Units at 01/29/17 0547  . HYDROcodone-acetaminophen (NORCO/VICODIN) 5-325 MG per tablet 1 tablet  1 tablet Oral Q6H PRN Etta Quill, DO   1 tablet at 01/29/17 0215  . insulin aspart (novoLOG) injection 0-9 Units  0-9 Units Subcutaneous TID WC Etta Quill, DO   Stopped at 01/29/17 1004  . levothyroxine (SYNTHROID, LEVOTHROID) tablet 112 mcg  112 mcg Oral QAC breakfast Etta Quill,  DO   112 mcg at 01/29/17 0935  . meclizine (ANTIVERT) tablet 25 mg  25 mg Oral QID PRN Etta Quill, DO      . metoprolol tartrate (LOPRESSOR) tablet 12.5 mg  12.5 mg Oral BID Jennette Kettle M, DO   12.5 mg at 01/29/17 8527  . ondansetron (ZOFRAN) injection 4 mg  4 mg Intravenous Q6H PRN Etta Quill, DO      . potassium chloride (K-DUR,KLOR-CON) CR tablet 30 mEq  30 mEq Oral BID Benito Mccreedy, MD   30 mEq at 01/29/17 1421  . prochlorperazine (COMPAZINE) tablet 10 mg  10 mg Oral Q6H PRN Etta Quill, DO      . promethazine (PHENERGAN) tablet 25 mg  25 mg Oral Q6H PRN Etta Quill, DO      . sodium chloride flush (NS) 0.9 % injection 10-40 mL  10-40 mL Intracatheter PRN Osei-Bonsu, George, MD      . sodium chloride flush (NS) 0.9 % injection 3 mL  3 mL Intravenous Q12H Jennette Kettle M, DO   3 mL at 01/29/17 1052  . sodium chloride flush (NS) 0.9 % injection 3 mL  3 mL Intravenous PRN Etta Quill, DO      . torsemide [facility]) tablet 40 mg  40 mg Oral Daily Jennette Kettle M, DO   40 mg at 01/29/17 7824    Review of Systems: Review of Systems  Cardiovascular: Positive for leg swelling.  Gastrointestinal: Positive for nausea.  Neurological: Positive for dizziness.  Hematological: Bruises/bleeds easily.  All other systems reviewed and are negative.    PHYSICAL EXAMINATION There were no vitals taken for this visit.  ECOG PERFORMANCE STATUS: 2 - Symptomatic, <50% confined to bed  Physical Exam  Constitutional: She is oriented to person, place, and time.  Frail elderly female in no acute distress.  Alert, awake, oriented x3.  HENT:  Head: Normocephalic and atraumatic.  Mouth/Throat: Oropharynx is clear and moist. No oropharyngeal exudate.  Eyes: Conjunctivae and EOM are normal. Pupils are equal, round, and reactive to light. No scleral icterus.  Neck: No thyromegaly present.  Cardiovascular: Normal rate, regular rhythm and normal heart sounds.  No murmur  heard. Pulmonary/Chest: Effort normal and breath sounds normal. No respiratory distress. She has no wheezes. She has no rales.  Abdominal: Soft. Bowel sounds are normal. She exhibits no distension and no mass. There is no tenderness. There is no guarding.  Musculoskeletal: She exhibits no edema.  Lymphadenopathy:    She has no cervical adenopathy.  Neurological: She is alert and oriented to person, place, and time. She has normal reflexes. No cranial nerve deficit.  Skin: Skin is warm and dry. No rash noted. No erythema.     LABORATORY DATA: I have personally reviewed the data as listed: Admission on 01/15/2017, Discharged on 01/15/2017  Component Date Value Ref Range Status  . WBC 01/15/2017 5.0  4.0 - 10.5 K/uL Final  . RBC 01/15/2017 3.13* 3.87 - 5.11 MIL/uL Final  . Hemoglobin 01/15/2017 9.9* 12.0 - 15.0 g/dL Final  . HCT 01/15/2017 31.1* 36.0 - 46.0 % Final  . MCV 01/15/2017 99.4  78.0 - 100.0 fL Final  . MCH 01/15/2017 31.6  26.0 - 34.0 pg Final  . MCHC 01/15/2017 31.8  30.0 - 36.0 g/dL Final  . RDW 01/15/2017 13.4  11.5 - 15.5 % Final  . Platelets 01/15/2017 187  150 - 400 K/uL Final  . Neutrophils Relative % 01/15/2017 69  % Final  . Neutro Abs 01/15/2017 3.5  1.7 - 7.7 K/uL Final  . Lymphocytes Relative 01/15/2017 18  % Final  . Lymphs Abs 01/15/2017 0.9  0.7 - 4.0 K/uL Final  . Monocytes Relative 01/15/2017 9  % Final  . Monocytes Absolute 01/15/2017 0.5  0.1 - 1.0 K/uL Final  . Eosinophils Relative 01/15/2017 4  % Final  . Eosinophils Absolute 01/15/2017 0.2  0.0 - 0.7 K/uL Final  . Basophils Relative 01/15/2017 0  % Final  . Basophils Absolute 01/15/2017 0.0  0.0 - 0.1 K/uL Final  . Sodium 01/15/2017 134* 135 - 145 mmol/L Final  . Potassium 01/15/2017 4.1  3.5 - 5.1 mmol/L Final  . Chloride 01/15/2017 99* 101 - 111 mmol/L Final  . CO2 01/15/2017 25  22 - 32 mmol/L Final  . Glucose, Bld 01/15/2017 243* 65 - 99 mg/dL Final  . BUN 01/15/2017 19  6 - 20 mg/dL Final  .  Creatinine, Ser 01/15/2017 1.68* 0.44 - 1.00 mg/dL Final  . Calcium 01/15/2017 9.2  8.9 - 10.3 mg/dL Final  . Total Protein 01/15/2017 5.8* 6.5 - 8.1 g/dL Final  . Albumin 01/15/2017 2.8* 3.5 - 5.0 g/dL Final  . AST 01/15/2017 21  15 - 41 U/L Final  . ALT 01/15/2017 8* 14 - 54 U/L Final  . Alkaline Phosphatase 01/15/2017 68  38 - 126 U/L Final  . Total Bilirubin 01/15/2017 0.6  0.3 - 1.2 mg/dL Final  . GFR calc non Af Amer 01/15/2017 27* >60 mL/min Final  . GFR calc Af Amer 01/15/2017 32* >60 mL/min Final   Comment: (NOTE) The eGFR has been calculated using the CKD EPI equation. This calculation has not been validated in all clinical situations. eGFR's persistently <60 mL/min signify possible Chronic Kidney Disease.   . Anion gap 01/15/2017 10  5 - 15 Final  . Color, Urine 01/15/2017 YELLOW  YELLOW Final  .  APPearance 01/15/2017 CLEAR  CLEAR Final  . Specific Gravity, Urine 01/15/2017 1.006  1.005 - 1.030 Final  . pH 01/15/2017 5.0  5.0 - 8.0 Final  . Glucose, UA 01/15/2017 NEGATIVE  NEGATIVE mg/dL Final  . Hgb urine dipstick 01/15/2017 NEGATIVE  NEGATIVE Final  . Bilirubin Urine 01/15/2017 NEGATIVE  NEGATIVE Final  . Ketones, ur 01/15/2017 NEGATIVE  NEGATIVE mg/dL Final  . Protein, ur 01/15/2017 NEGATIVE  NEGATIVE mg/dL Final  . Nitrite 01/15/2017 NEGATIVE  NEGATIVE Final  . Leukocytes, UA 01/15/2017 TRACE* NEGATIVE Final  . RBC / HPF 01/15/2017 NONE SEEN  0 - 5 RBC/hpf Final  . WBC, UA 01/15/2017 6-30  0 - 5 WBC/hpf Final  . Bacteria, UA 01/15/2017 RARE* NONE SEEN Final  . Squamous Epithelial / LPF 01/15/2017 0-5* NONE SEEN Final  . Specimen Description 01/15/2017 URINE, CLEAN CATCH   Final  . Special Requests 01/15/2017 NONE   Final  . Culture 01/15/2017 >=100,000 COLONIES/mL KLEBSIELLA PNEUMONIAE*  Final  . Report Status 01/15/2017 01/18/2017 FINAL   Final  . Organism ID, Bacteria 01/15/2017 KLEBSIELLA PNEUMONIAE*  Final       Ardath Sax, MD

## 2017-01-30 ENCOUNTER — Other Ambulatory Visit (HOSPITAL_COMMUNITY): Payer: Medicare Other

## 2017-01-30 ENCOUNTER — Ambulatory Visit (HOSPITAL_COMMUNITY): Payer: Medicare Other

## 2017-01-30 ENCOUNTER — Ambulatory Visit (HOSPITAL_COMMUNITY): Payer: Medicare Other | Admitting: Internal Medicine

## 2017-01-30 LAB — OSMOLALITY, URINE: Osmolality, Ur: 245 mosm/kg — ABNORMAL LOW (ref 300–900)

## 2017-01-30 LAB — BASIC METABOLIC PANEL WITH GFR
Anion gap: 11 (ref 5–15)
BUN: 20 mg/dL (ref 6–20)
CO2: 29 mmol/L (ref 22–32)
Calcium: 8.1 mg/dL — ABNORMAL LOW (ref 8.9–10.3)
Chloride: 87 mmol/L — ABNORMAL LOW (ref 101–111)
Creatinine, Ser: 2.13 mg/dL — ABNORMAL HIGH (ref 0.44–1.00)
GFR calc Af Amer: 24 mL/min — ABNORMAL LOW
GFR calc non Af Amer: 20 mL/min — ABNORMAL LOW
Glucose, Bld: 154 mg/dL — ABNORMAL HIGH (ref 65–99)
Potassium: 3.7 mmol/L (ref 3.5–5.1)
Sodium: 127 mmol/L — ABNORMAL LOW (ref 135–145)

## 2017-01-30 LAB — OSMOLALITY: Osmolality: 278 mosm/kg (ref 275–295)

## 2017-01-30 LAB — GLUCOSE, CAPILLARY
GLUCOSE-CAPILLARY: 114 mg/dL — AB (ref 65–99)
GLUCOSE-CAPILLARY: 173 mg/dL — AB (ref 65–99)
Glucose-Capillary: 205 mg/dL — ABNORMAL HIGH (ref 65–99)
Glucose-Capillary: 101 mg/dL — ABNORMAL HIGH (ref 65–99)

## 2017-01-30 MED ORDER — DIAZEPAM 2 MG PO TABS
1.0000 mg | ORAL_TABLET | Freq: Three times a day (TID) | ORAL | Status: AC | PRN
  Administered 2017-01-30 – 2017-01-31 (×2): 1 mg via ORAL
  Filled 2017-01-30 (×2): qty 1

## 2017-01-30 NOTE — Progress Notes (Signed)
[facility]  Patient Status: Active (receiving services up to time of hospitalization)  [facility] is providing the following services: PT, OT and MSW  Readmitted to the Hospital prior to start of services.  If patient discharges after hours, please call [phone].   Joann Hunter 01/30/2017, 11:02 AM

## 2017-01-30 NOTE — Progress Notes (Signed)
PROGRESS NOTE   Joann Hunter  VQQ:[id number]    DOB: 1932/03/24    DOA: 01/28/2017  PCP: Monico Blitz, MD   I have briefly reviewed patients previous medical records in [facility].  Brief Narrative:  84 year old female patient, lives with her daughter in [place], [place] with the help of a walker, PMH of CAD, ischemic cardiomyopathy (TTE 01/22/17: LVEF 55-60% and grade 1 diastolic dysfunction), stage IV chronic kidney disease, DM 2, HLD, HTN, hypothyroid, vertigo, metastatic urothelial carcinoma, failed immunotherapy and now on palliative systemic chemotherapy with gemcitabine, anemia status post PRBCs, recent hospitalization at [facility] 1/19-1/23 for acute on chronic diastolic CHF, acute on stage IV chronic kidney disease, hypervolemic hyponatremia and Klebsiella UTI, presented to [facility] due to persistent vertigo to a point where patient was unable to safely walk, worse with positional change and better at rest, sodium had worsened from 132-125 and creatinine up to 2.66 from 1.9.  Admitted for further evaluation and management of hyponatremia, acute on stage IV chronic kidney disease and vertigo.  Slowly improving.   Assessment & Plan:   Principal Problem:   Hyponatremia Active Problems:   Acute kidney injury superimposed on chronic kidney disease (HCC)   Chronic kidney disease (CKD), stage IV (severe) (HCC)   Vertigo   Type 2 diabetes with nephropathy (HCC)   Urothelial cancer ([place])   AKI (acute kidney injury) ([place])   1. Vertigo: Acute on chronic.  Possibly BPPV.  Orthostatic hypotension may be contributing some.  BP 122/46 (lying) >139/52 (sitting) >105/58 (standing).  MRI brain without acute abnormality.  She gives prior history of vertigo, has been on meclizine, worsened in the last few days.  PT vestibular evaluation.  If still has persistent symptoms, may have to reluctantly start her on benzodiazepines. 2. Hyponatremia: Subacute on chronic.  DD: Hypervolemic hyponatremia  versus SIADH.  Appears mildly volume overloaded.  Her sodium is usually in the low to mid 30s.  Now presented with sodium of 125, improved to 127.  Fluid restriction, intake output, continue torsemide.  Follow BMP in a.m.  Follow serum and urine osmolarity. 3. Acute on stage IV chronic kidney disease: Baseline creatinine may be in the 1.8-2 range.  During recent admission, felt to be cardiorenal in etiology, improved with Lasix, off of diuretics for a few days while at home, returned with creatinine of 2.66.  Torsemide continued.  Creatinine has improved to 2.13.  Follow BMP.  Improving.   4. Essential hypertension: Controlled.  Mildly orthostatic.  Continue metoprolol 1.5 mg twice daily.  Seems to be well controlled.  DC amlodipine which can cause orthostasis.  If needed, can titrate up metoprolol. 5. Type II DM with renal complications: Mildly uncontrolled and fluctuating.  Continue NovoLog SSI. 6. Hypothyroid: TSH 2.347 on 12/27.  Continue Synthroid. 7. Anemia: Multifactorial: Chronic disease, malignancy, chemotherapy, chronic kidney disease.  Status post recent PRBC transfusion.  Stable. 8. Hypokalemia: Replaced. 9. Chronic diastolic CHF: Cardiomyopathy seems to have resolved.  Mild volume overload.  Continue oral torsemide. 10. Metastatic urothelial carcinoma: Outpatient follow-up with oncology/Dr. Lebron Conners. 11. Recent Klebsiella UTI: No urinary symptoms, fever or leukocytosis.  Was on 5 days of appropriate antibiotics during recent admission in 2 days of Rocephin this admission.  Completed course.  Discontinued Rocephin.   DVT prophylaxis: Subcutaneous heparin Code Status: Full Family Communication: Discussed in detail with patient's daughter at bedside. Disposition: DC home possibly 1/29 is pending improvement in hyponatremia, vertigo and PT evaluation.   Consultants:  None  Procedures:  None  Antimicrobials:  IV ceftriaxone-discontinued 1/28   Subjective: No dysuria, urinary  frequency, fever or chills.  Prior history of vertigo, worked up by Dr. Melina Copa, was on as needed meclizine, worsened in the last few days.  Somewhat improved in the hospital but still significant when she ambulated to the bathroom last night.  "Feels like everything is spinning".  Hard of hearing in both ears.  Reports some tinnitus in the right ear.  No dyspnea or chest pain.  No strokelike symptoms.  ROS: As above.  Objective:  Vitals:   01/29/17 2118 01/30/17 0138 01/30/17 0420 01/30/17 0925  BP: (!) 147/59  (!) 120/56 (!) 122/46  Pulse: 69  64 62  Resp: 16  16 18   Temp: 98.4 F (36.9 C)  98.2 F (36.8 C) 98.4 F (36.9 C)  TempSrc: Oral  Oral Oral  SpO2: 98%  95% 94%  Weight: 76.6 kg (168 lb 14 oz) 76.6 kg (168 lb 14 oz)    Height:        Examination:  General exam: Pleasant elderly female, moderately built and nourished, lying comfortably propped up in bed.  Oral mucosa moist. Respiratory system: Clear to auscultation. Respiratory effort normal. Cardiovascular system: S1 & S2 heard, RRR. No JVD, murmurs, rubs, gallops or clicks.  Trace ankle edema.  Telemetry personally reviewed: Sinus rhythm. Gastrointestinal system: Abdomen is nondistended, soft and nontender. No organomegaly or masses felt. Normal bowel sounds heard. Central nervous system: Alert and oriented. No focal neurological deficits.  Hard of hearing. Extremities: Symmetric 5 x 5 power. Skin: No rashes, lesions or ulcers Psychiatry: Judgement and insight impaired. Mood & affect flat.     Data Reviewed: I have personally reviewed following labs and imaging studies  CBC: Recent Labs  Lab 01/26/17 1030 01/28/17 1730  WBC 7.2 6.4  NEUTROABS 4.7 3.8  HGB 10.5* 11.8*  HCT 32.6* 35.2*  MCV 98.5 93.1  PLT 189 177   Basic Metabolic Panel: Recent Labs  Lab 01/24/17 0713 01/25/17 0520 01/28/17 1730 01/29/17 0545 01/30/17 0427  NA 131* 132* 125* 125* 127*  K 4.0 4.0 3.6 3.1* 3.7  CL 91* 88* 85* 86* 87*    CO2 27 32 29 26 29   GLUCOSE 173* 187* 168* 181* 154*  BUN 25* 23* 24* 23* 20  CREATININE 2.05* 1.91* 2.66* 2.29* 2.13*  CALCIUM 8.6* 8.9 8.4* 8.0* 8.1*   Liver Function Tests: Recent Labs  Lab 01/28/17 1730  AST 32  ALT 12*  ALKPHOS 79  BILITOT 0.8  PROT 6.1*  ALBUMIN 2.6*   Coagulation Profile: Recent Labs  Lab 01/28/17 2350  INR 1.07   Cardiac Enzymes: No results for input(s): CKTOTAL, CKMB, CKMBINDEX, TROPONINI in the last 168 hours. HbA1C: No results for input(s): HGBA1C in the last 72 hours. CBG: Recent Labs  Lab 01/29/17 0829 01/29/17 1352 01/29/17 1640 01/29/17 2041 01/30/17 0746  GLUCAP 133* 186* 216* 153* 114*    No results found for this or any previous visit (from the past 240 hour(s)).       Radiology Studies: Mr Brain Wo Contrast  Result Date: 01/29/2017 CLINICAL DATA:  Vertigo EXAM: MRI HEAD WITHOUT CONTRAST TECHNIQUE: Multiplanar, multiecho pulse sequences of the brain and surrounding structures were obtained without intravenous contrast. COMPARISON:  Head CT 01/15/2017 FINDINGS: Brain: The midline structures are normal. There is no acute infarct or acute hemorrhage. No mass lesion, hydrocephalus, dural abnormality or extra-axial collection. There is multifocal white matter hyperintensity suggesting chronic ischemic microangiopathy.  No age-advanced or lobar predominant atrophy. No chronic microhemorrhage or superficial siderosis. Vascular: Major intracranial arterial and venous sinus flow voids are preserved. Skull and upper cervical spine: The visualized skull base, calvarium, upper cervical spine and extracranial soft tissues are normal. Sinuses/Orbits: No fluid levels or advanced mucosal thickening. No mastoid or middle ear effusion. Normal orbits. IMPRESSION: Chronic ischemic microangiopathy without acute intracranial abnormality. Electronically Signed   By: Ulyses Jarred M.D.   On: 01/29/2017 03:58   Dg Chest Port 1 View  Result Date:  01/28/2017 CLINICAL DATA:  Hyponatremia. EXAM: PORTABLE CHEST 1 VIEW COMPARISON:  01/21/2017 FINDINGS: The heart size and mediastinal contours are within normal limits. Aortic atherosclerosis. Both lungs are clear. Right-sided power port remains in appropriate position. IMPRESSION: No active disease. Electronically Signed   By: Earle Gell M.D.   On: 01/28/2017 23:24        Scheduled Meds: . amLODipine  5 mg Oral Daily  . aspirin EC  81 mg Oral Daily  . feeding supplement (ENSURE ENLIVE)  237 mL Oral BID BM  . heparin  5,000 Units Subcutaneous Q8H  . insulin aspart  0-9 Units Subcutaneous TID WC  . levothyroxine  112 mcg Oral QAC breakfast  . metoprolol tartrate  12.5 mg Oral BID  . sodium chloride flush  3 mL Intravenous Q12H  . torsemide  40 mg Oral Daily   Continuous Infusions: . sodium chloride       LOS: 1 day     Vernell Leep, MD, FACP, [facility]. [facility] Pager [phone]  If 7PM-7AM, please contact night-coverage [email or web address] Password [facility] 01/30/2017, 11:23 AM

## 2017-01-30 NOTE — Care Management Note (Addendum)
Case Management Note  Patient Details  Name: Joann Hunter MRN: [id number] Date of Birth: Nov 30, 1932  Subjective/Objective:    History of CHF, admitted   management of hyponatremia, acute on stage IV chronic kidney disease and vertigo            Action/Plan: DC home possibly 1/29 is pending improvement in hyponatremia, vertigo and PT evaluation.  Expected Discharge Date:    01/31/2017              Expected Discharge Plan:  [place]  Discharge planning Services  CM Consult, Other - See comment(Patient is actively receiving chemo at the [place])  [place] Choice:  [place] Choice offered to:  Patient, Adult Children(Joann Hunter [phone])  HH Arranged:  RN, PT, OT(Anticipate transitional care needs to resume [place] services adding RN, OT discipline with [facility] who patient is active with)   [place] Agency:  [facility](Actively set up to go to do initial visit once patient is discharged )  Status of Service:  In process, will continue to follow   Additional Information:  Prior to admission patient lives with daughter in [place].  PCP noted.  [facility] [place] [place].  Home DME: rolling walker.  NCM will continue to follow for discharge needs.  Kristen Cardinal, RN  Nurse case manager 54M [phone] 01/30/2017, 12:07 PM

## 2017-01-31 LAB — BASIC METABOLIC PANEL
Anion gap: 11 (ref 5–15)
BUN: 19 mg/dL (ref 6–20)
CO2: 30 mmol/L (ref 22–32)
Calcium: 8.4 mg/dL — ABNORMAL LOW (ref 8.9–10.3)
Chloride: 87 mmol/L — ABNORMAL LOW (ref 101–111)
Creatinine, Ser: 2.14 mg/dL — ABNORMAL HIGH (ref 0.44–1.00)
GFR calc Af Amer: 24 mL/min — ABNORMAL LOW (ref >60)
GFR, EST NON AFRICAN AMERICAN: 20 mL/min — AB (ref >60)
GLUCOSE: 131 mg/dL — AB (ref 65–99)
POTASSIUM: 3.8 mmol/L (ref 3.5–5.1)
Sodium: 128 mmol/L — ABNORMAL LOW (ref 135–145)

## 2017-01-31 LAB — CBC
HCT: 32.9 % — ABNORMAL LOW (ref 36.0–46.0)
HEMOGLOBIN: 11 g/dL — AB (ref 12.0–15.0)
MCH: 31.6 pg (ref 26.0–34.0)
MCHC: 33.4 g/dL (ref 30.0–36.0)
MCV: 94.5 fL (ref 78.0–100.0)
Platelets: 265 10*3/uL (ref 150–400)
RBC: 3.48 MIL/uL — ABNORMAL LOW (ref 3.87–5.11)
RDW: 15.2 % (ref 11.5–15.5)
WBC: 4.4 10*3/uL (ref 4.0–10.5)

## 2017-01-31 LAB — GLUCOSE, CAPILLARY
GLUCOSE-CAPILLARY: 129 mg/dL — AB (ref 65–99)
GLUCOSE-CAPILLARY: 200 mg/dL — AB (ref 65–99)
Glucose-Capillary: 183 mg/dL — ABNORMAL HIGH (ref 65–99)
Glucose-Capillary: 169 mg/dL — ABNORMAL HIGH (ref 65–99)

## 2017-01-31 LAB — MAGNESIUM: Magnesium: 1.6 mg/dL — ABNORMAL LOW (ref 1.7–2.4)

## 2017-01-31 MED ORDER — TORSEMIDE 20 MG PO TABS
20.0000 mg | ORAL_TABLET | Freq: Every day | ORAL | Status: DC
  Administered 2017-01-31: 20 mg via ORAL
  Filled 2017-01-31: qty 1

## 2017-01-31 NOTE — Evaluation (Signed)
Occupational Therapy Evaluation Patient Details Name: Joann Hunter MRN: [id number] DOB: [birth year] Today's Date: 01/31/2017    History of Present Illness 84 year old female patient, lives with her daughter in [place], [place] with the help of a walker, PMH of CAD, ischemic cardiomyopathy (TTE 01/22/17: LVEF 55-60% and grade 1 diastolic dysfunction), stage IV chronic kidney disease, DM 2, HLD, HTN, hypothyroid, vertigo, metastatic urothelial carcinoma, failed immunotherapy and now on palliative systemic chemotherapy with gemcitabine, anemia status post PRBCs, recent hospitalization at [facility] 1/19-1/23 for acute on chronic diastolic CHF, acute on stage IV chronic kidney disease, hypervolemic hyponatremia and Klebsiella UTI, presented to [facility] due to persistent vertigo to a point where patient was unable to safely walk, worse with positional change and better at rest, sodium had worsened from 132-125 and creatinine up to 2.66 from 1.9.  Admitted for further evaluation and management of hyponatremia, acute on stage IV chronic kidney disease and vertigo.  Slowly improving.   Clinical Impression   Pt with decline in function and safety with ADLs and ADL mobility with decreased strength, balance and endurance. Pt reports dizziness and sitting EOB and with SPT BSC; fatigues easily. Pt would benefit from acute OT services to address impairments to maximize level of function and safety    Follow Up Recommendations  Home health OT;Supervision/Assistance - 24 hour    Equipment Recommendations  3 in 1 bedside commode;Tub/shower seat    Recommendations for Other Services       Precautions / Restrictions Precautions Precautions: Fall Restrictions Weight Bearing Restrictions: No      Mobility Bed Mobility Overal bed mobility: Needs Assistance Bed Mobility: Supine to Sit;Sit to Supine     Supine to sit: Min assist;Min guard Sit to supine: Min assist   General bed mobility comments: pt  reporta dizzines sitting on EOB  Transfers Overall transfer level: Needs assistance Equipment used: 2 person hand held assist Transfers: Sit to/from [facility] Sit to Stand: Min assist Stand pivot transfers: Min assist       General transfer comment: Slight steadying assist upon standing and close min steadying assist for pivot transfer as pt was dizzy.      Balance Overall balance assessment: Needs assistance Sitting-balance support: Feet supported;Bilateral upper extremity supported Sitting balance-Leahy Scale: Fair     Standing balance support: Bilateral upper extremity supported;During functional activity Standing balance-Leahy Scale: Poor Standing balance comment: relies on UE support for balance                           ADL either performed or assessed with clinical judgement   ADL Overall ADL's : Needs assistance/impaired Eating/Feeding: Sitting;Set up   Grooming: Wash/dry hands;Wash/dry face;Sitting;Min guard   Upper Body Bathing: Min guard;Sitting   Lower Body Bathing: Moderate assistance;With caregiver independent assisting   Upper Body Dressing : Min guard;Sitting   Lower Body Dressing: Moderate assistance;With caregiver independent assisting   Toilet Transfer: Minimal assistance;Min guard;Stand-pivot;Cueing for safety;BSC;With caregiver independent assisting   Toileting- Clothing Manipulation and Hygiene: Moderate assistance;Sit to/from stand;With caregiver independent assisting       Functional mobility during ADLs: Minimal assistance;Min guard;Cueing for safety;Caregiver able to provide necessary level of assistance       Vision Baseline Vision/History: Wears glasses Wears Glasses: Reading only Patient Visual Report: No change from baseline       Perception     Praxis      Pertinent Vitals/Pain Pain Assessment: No/denies pain  Hand Dominance Right   Extremity/Trunk Assessment Upper Extremity  Assessment Upper Extremity Assessment: Generalized weakness   Lower Extremity Assessment Lower Extremity Assessment: Defer to PT evaluation   Cervical / Trunk Assessment Cervical / Trunk Assessment: Normal   Communication Communication Communication: HOH;No difficulties   Cognition Arousal/Alertness: Awake/alert Behavior During Therapy: WFL for tasks assessed/performed Overall Cognitive Status: Within Functional Limits for tasks assessed                                     General Comments       Exercises     Shoulder Instructions      Home Living Family/patient expects to be discharged to:: Private residence Living Arrangements: Children Available Help at Discharge: Family Type of Home: House Home Access: Level entry     Home Layout: One level     Bathroom Shower/Tub: Occupational psychologist: Standard Bathroom Accessibility: Yes   Home Equipment: Environmental consultant - 2 wheels;Bedside commode;Shower seat;Wheelchair - manual;Hospital bed;Grab bars - tub/shower          Prior Functioning/Environment Level of Independence: Needs assistance  Gait / Transfers Assistance Needed: household ambulation with RW ADL's / Homemaking Assistance Needed: assisted by family   Comments: Indepdent with RW         OT Problem List: Decreased activity tolerance;Decreased knowledge of use of DME or AE;Impaired balance (sitting and/or standing)      OT Treatment/Interventions: Self-care/ADL training;DME and/or AE instruction;Therapeutic activities;Therapeutic exercise;Patient/family education;Neuromuscular education    OT Goals(Current goals can be found in the care plan section) Acute Rehab OT Goals Patient Stated Goal: return home with family to assist OT Goal Formulation: With patient/family Time For Goal Achievement: 02/14/17 Potential to Achieve Goals: Good ADL Goals Pt Will Perform Grooming: with min guard assist;standing;with caregiver independent in  assisting Pt Will Perform Upper Body Bathing: with supervision;with set-up;sitting;with caregiver independent in assisting Pt Will Perform Lower Body Bathing: with caregiver independent in assisting;sit to/from stand;with min assist Pt Will Perform Upper Body Dressing: with supervision;with set-up;sitting;with caregiver independent in assisting Pt Will Perform Lower Body Dressing: with min assist;sit to/from stand;with caregiver independent in assisting Pt Will Transfer to Toilet: with min guard assist;with supervision;bedside commode;regular height toilet Pt Will Perform Toileting - Clothing Manipulation and hygiene: with min assist;with min guard assist;sit to/from stand;with caregiver independent in assisting Pt Will Perform Tub/Shower Transfer: with min guard assist;with supervision;3 in 1;shower seat;with caregiver independent in assisting  OT Frequency: Min 2X/week   Barriers to D/C:    no barriers       Co-evaluation              AM-PAC PT "6 Clicks" Daily Activity     Outcome Measure Help from another person eating meals?: None Help from another person taking care of personal grooming?: None Help from another person toileting, which includes using toliet, bedpan, or urinal?: A Lot Help from another person bathing (including washing, rinsing, drying)?: A Lot Help from another person to put on and taking off regular upper body clothing?: A Little Help from another person to put on and taking off regular lower body clothing?: A Lot 6 Click Score: 17   End of Session Equipment Utilized During Treatment: Gait belt  Activity Tolerance: Patient limited by fatigue Patient left: in bed;with call bell/phone within reach;with family/visitor present  OT Visit Diagnosis: Unsteadiness on feet (R26.81);Muscle weakness (generalized) (M62.81);Dizziness and giddiness (  R42)                Time: 4961-1643 OT Time Calculation (min): 26 min Charges:  OT General Charges $OT Visit: 1  Visit OT Evaluation $OT Eval Moderate Complexity: 1 Mod OT Treatments $Therapeutic Activity: 8-22 mins G-Codes: OT G-codes **NOT FOR INPATIENT CLASS** Functional Assessment Tool Used: AM-PAC 6 Clicks Daily Activity     Britt Bottom 01/31/2017, 2:16 PM

## 2017-01-31 NOTE — Evaluation (Addendum)
Physical Therapy Evaluation Patient Details Name: Joann Hunter MRN: [id number] DOB: April 15, 1932 Today's Date: 01/31/2017   History of Present Illness  84 year old female patient, lives with her daughter in [place], [place] with the help of a walker, PMH of CAD, ischemic cardiomyopathy (TTE 01/22/17: LVEF 55-60% and grade 1 diastolic dysfunction), stage IV chronic kidney disease, DM 2, HLD, HTN, hypothyroid, vertigo, metastatic urothelial carcinoma, failed immunotherapy and now on palliative systemic chemotherapy with gemcitabine, anemia status post PRBCs, recent hospitalization at [facility] 1/19-1/23 for acute on chronic diastolic CHF, acute on stage IV chronic kidney disease, hypervolemic hyponatremia and Klebsiella UTI, presented to [facility] due to persistent vertigo to a point where patient was unable to safely walk, worse with positional change and better at rest, sodium had worsened from 132-125 and creatinine up to 2.66 from 1.9.  Admitted for further evaluation and management of hyponatremia, acute on stage IV chronic kidney disease and vertigo.  Slowly improving.  Clinical Impression  Pt admitted with above diagnosis. Pt currently with functional limitations due to the deficits listed below (see PT Problem List). 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  Difficult assessment as pt had Meclizine this am and this hinders assessment as it suppresses the system.  Did inform the pt and daughter regarding this and they verbalize understanding.  Pt treated for right BPPV as this side seemed to be more affected.  Dizziness incr to 10/10 dizziness once in chair but encouraged pt to stay up in chair and PT would return to check on pt in 45 minutes.   Pt will benefit from skilled PT to increase their independence and safety with mobility to allow discharge to the venue listed below.    Follow Up Recommendations Home health PT vestibular rehab;Supervision for mobility/OOB    Equipment  Recommendations  None recommended by PT    Recommendations for Other Services       Precautions / Restrictions Precautions Precautions: Fall Restrictions Weight Bearing Restrictions: No      Mobility  Bed Mobility Overal bed mobility: Needs Assistance Bed Mobility: Supine to Sit     Supine to sit: Min assist;Min guard     General bed mobility comments: Prior to gettting to EOB, tested for BPPV.  Pt with positive dizziness bil hallpike.  Pt had Meclizine prior to PT arrival per daughter as she has taken it for years.  Explained to daughter that the medication suppresses the system and that it is difficult to assess for BPPV but decided to treat the right side for right posterior canal as this side appeared to cause more dizziness.  Treated using the bed in Trendelenberg for pt comfort for right posterior canal BPPV via canalith repositioniing maneuver.  Needed a little assist to come to EOB.    Transfers Overall transfer level: Needs assistance Equipment used: Rolling walker (2 wheeled) Transfers: Sit to/from [facility] Sit to Stand: Min assist Stand pivot transfers: Min assist       General transfer comment: Slight steadying assist upon standing and close min steadying assist for pivot transfer as pt was dizzy.  Of note, pt was 4/10 dizzy on arrival at rest.  10/10 during and after treatment per pt.  Left pt in chair and told her this PT would come back in 30-45 minutes to check on her.    Ambulation/Gait                Stairs  Wheelchair Mobility    Modified Rankin (Stroke Patients Only)       Balance Overall balance assessment: Needs assistance Sitting-balance support: Feet supported;Bilateral upper extremity supported Sitting balance-Leahy Scale: Fair     Standing balance support: Bilateral upper extremity supported;During functional activity Standing balance-Leahy Scale: Poor Standing balance comment: relies on UE  support for balance                             Pertinent Vitals/Pain Pain Assessment: No/denies pain    Home Living Family/patient expects to be discharged to:: Private residence Living Arrangements: Children(daughter and great grandson) Available Help at Discharge: Family Type of Home: House Home Access: Level entry     Home Layout: One level Home Equipment: Environmental consultant - 2 wheels;Bedside commode;Shower seat;Wheelchair - manual;Hospital bed;Grab bars - tub/shower      Prior Function Level of Independence: Needs assistance   Gait / Transfers Assistance Needed: household ambulation with RW  ADL's / Homemaking Assistance Needed: assisted by family  Comments: Indepdent with RW      Hand Dominance        Extremity/Trunk Assessment   Upper Extremity Assessment Upper Extremity Assessment: Defer to OT evaluation    Lower Extremity Assessment Lower Extremity Assessment: Generalized weakness    Cervical / Trunk Assessment Cervical / Trunk Assessment: Normal  Communication   Communication: HOH;No difficulties  Cognition Arousal/Alertness: Awake/alert Behavior During Therapy: WFL for tasks assessed/performed Overall Cognitive Status: Within Functional Limits for tasks assessed                                        General Comments      Exercises     Assessment/Plan    PT Assessment Patient needs continued PT services  PT Problem List Decreased strength;Decreased activity tolerance;Decreased mobility;Decreased balance       PT Treatment Interventions Gait training;Functional mobility training;Therapeutic activities;Therapeutic exercise;Patient/family education(vestibular rehab)    PT Goals (Current goals can be found in the Care Plan section)  Acute Rehab PT Goals Patient Stated Goal: return home with family to assist PT Goal Formulation: With patient/family Time For Goal Achievement: 02/14/17 Potential to Achieve Goals: Good     Frequency Min 3X/week   Barriers to discharge        Co-evaluation               AM-PAC PT "6 Clicks" Daily Activity  Outcome Measure Difficulty turning over in bed (including adjusting bedclothes, sheets and blankets)?: Unable Difficulty moving from lying on back to sitting on the side of the bed? : Unable Difficulty sitting down on and standing up from a chair with arms (e.g., wheelchair, bedside commode, etc,.)?: A Little Help needed moving to and from a bed to chair (including a wheelchair)?: A Little Help needed walking in hospital room?: Total Help needed climbing 3-5 steps with a railing? : Total 6 Click Score: 10    End of Session Equipment Utilized During Treatment: Gait belt Activity Tolerance: Patient limited by fatigue(limited by dizziness) Patient left: in chair;with call bell/phone within reach;with family/visitor present Nurse Communication: Mobility status PT Visit Diagnosis: Unsteadiness on feet (R26.81);Other abnormalities of gait and mobility (R26.89);Muscle weakness (generalized) (M62.81);BPPV;Dizziness and giddiness (R42) BPPV - Right/Left : (bilateral)    Time: 9371-6967 PT Time Calculation (min) (ACUTE ONLY): 34 min   Charges:   PT Evaluation $  PT Eval Moderate Complexity: 1 Mod PT Treatments $Canalith Rep Proc: 8-22 mins   PT G Codes:        [place] Ahmya Bernick,PT Acute Rehabilitation [phone] (pager)   Denice Paradise 01/31/2017, 11:40 AM

## 2017-01-31 NOTE — Progress Notes (Signed)
PROGRESS NOTE   Joann Hunter  XAJ:[id number]    DOB: 05-10-1932    DOA: 01/28/2017  PCP: Monico Blitz, MD   I have briefly reviewed patients previous medical records in [facility].  Brief Narrative:  84 year old female patient, lives with her daughter in [place] with the help of a walker, PMH of CAD, ischemic cardiomyopathy (TTE 01/22/17: LVEF 55-60% and grade 1 diastolic dysfunction), stage IV chronic kidney disease, DM 2, HLD, HTN, hypothyroid, vertigo, metastatic urothelial carcinoma, failed immunotherapy and now on palliative systemic chemotherapy with gemcitabine, anemia status post PRBCs, recent hospitalization at [facility] 1/19-1/23 for acute on chronic diastolic CHF, acute on stage IV chronic kidney disease, hypervolemic hyponatremia and Klebsiella UTI, presented to [facility] due to persistent vertigo to a point where patient was unable to safely walk, worse with positional change and better at rest, sodium had worsened from 132-125 and creatinine up to 2.66 from 1.9.  Admitted for further evaluation and management of hyponatremia, acute on stage IV chronic kidney disease and vertigo.  Slowly improving.   Assessment & Plan:   Principal Problem:   Hyponatremia Active Problems:   Acute kidney injury superimposed on chronic kidney disease (HCC)   Chronic kidney disease (CKD), stage IV (severe) (HCC)   Vertigo   Type 2 diabetes with nephropathy (HCC)   Urothelial cancer ([place])   AKI (acute kidney injury) ([place])   1. Vertigo: Acute on chronic.  Possibly BPPV.  Orthostatic hypotension may be contributing some.  BP 122/46 (lying) >139/52 (sitting) >105/58 (standing).  MRI brain without acute abnormality.  vestib rehab shows definite BPPV--will need hh BPPV and PT 2. Hyponatremia: Subacute on chronic.  DD: Hypervolemic hyponatremia versus SIADH.  Appears mildly volume overloaded.  Her sodium is usually in the low to mid 30s.  Now presented with sodium of 125, improved to 128.   Fluid restriction, intake output, continue torsemide.  Sodium 20, osm 408--monitor-liberalize diet 3. Acute on stage IV chronic kidney disease: Baseline creatinine may be in the 1.8-2 range.  During recent admission, felt to be cardiorenal in etiology, improved with Lasix, off of diuretics for a few days while at home, returned with creatinine of 2.66.  Torsemide adjusted to 20 mg  Creatinine has improved to 2.13.  Follow BMP.  Improving.   4. Essential hypertension: Controlled.  Mildly orthostatic.  Continue metoprolol 12.5 mg twice daily.  Seems to be well controlled.  DC amlodipine which can cause orthostasis.  5. Type II DM with renal complications: Mildly uncontrolled and fluctuating.  Continue NovoLog SSI.  Sugars 129-183 6. Hypothyroid: TSH 2.347 on 12/27.  Continue Synthroid. 7. Anemia: Multifactorial: Chronic disease, malignancy, chemotherapy, chronic kidney disease.  Status post recent PRBC transfusion.  Stable. 8. Hypokalemia: Replaced. 9. Chronic diastolic CHF: Cardiomyopathy seems to have resolved.  Mild volume overload.  Continue oral torsemide. 10. Metastatic urothelial carcinoma: Outpatient follow-up with oncology/Dr. Lebron Conners. 11. Recent Klebsiella UTI: No urinary symptoms, fever or leukocytosis.  Was on 5 days of appropriate antibiotics during recent admission in 2 days of Rocephin this admission.  Completed course.  Discontinued Rocephin.   DVT prophylaxis: Subcutaneous heparin Code Status: Full Family Communication: Discussed in detail with patient's daughter at bedside. Disposition: DC home possibly 1/30 when sodium mor estable   Consultants:  None  Procedures:  None  Antimicrobials:  IV ceftriaxone-discontinued 1/28   Subjective:  Awake alert but dizzy stll and doesn't want to sit oob No cp No fever no chills no fever no abd  pain   Objective:  Vitals:   01/30/17 1822 01/30/17 2100 01/31/17 0508 01/31/17 0840  BP: (!) 128/56 (!) 125/51 130/69 (!) 143/82    Pulse: 66 67 71 65  Resp: 17 18 19 18   Temp: 97.7 F (36.5 C) 98.5 F (36.9 C) 98.6 F (37 C) 98.5 F (36.9 C)  TempSrc: Oral Oral Oral Oral  SpO2: 96% 97% 98% 96%  Weight:  76.2 kg (167 lb 15.9 oz)    Height:        Examination:  General exam: Pleasant elderly female, moderately built and nourished, lying comfortably propped up in bed.  Oral mucosa moist. Respiratory system: Clear to auscultation. Respiratory effort normal. Cardiovascular system: S1 & S2 heard, RRR. No JVD, murmurs, rubs, gallops or clicks.  Trace ankle edema.  Telemetry personally reviewed: Sinus rhythm. Gastrointestinal system: Abdomen is nondistended, soft and nontender. No organomegaly or masses felt. Normal bowel sounds heard. Central nervous system: Alert and oriented. No focal neurological deficits.  Hard of hearing. Extremities: Symmetric 5 x 5 power. Skin: No rashes, lesions or ulcers Psychiatry: Judgement and insight impaired. Mood & affect flat.     Data Reviewed: I have personally reviewed following labs and imaging studies  CBC: Recent Labs  Lab 01/26/17 1030 01/28/17 1730 01/31/17 0435  WBC 7.2 6.4 4.4  NEUTROABS 4.7 3.8  --   HGB 10.5* 11.8* 11.0*  HCT 32.6* 35.2* 32.9*  MCV 98.5 93.1 94.5  PLT 189 214 169   Basic Metabolic Panel: Recent Labs  Lab 01/25/17 0520 01/28/17 1730 01/29/17 0545 01/30/17 0427 01/31/17 0435  NA 132* 125* 125* 127* 128*  K 4.0 3.6 3.1* 3.7 3.8  CL 88* 85* 86* 87* 87*  CO2 32 29 26 29 30   GLUCOSE 187* 168* 181* 154* 131*  BUN 23* 24* 23* 20 19  CREATININE 1.91* 2.66* 2.29* 2.13* 2.14*  CALCIUM 8.9 8.4* 8.0* 8.1* 8.4*  MG  --   --   --   --  1.6*   Liver Function Tests: Recent Labs  Lab 01/28/17 1730  AST 32  ALT 12*  ALKPHOS 79  BILITOT 0.8  PROT 6.1*  ALBUMIN 2.6*   Coagulation Profile: Recent Labs  Lab 01/28/17 2350  INR 1.07   Cardiac Enzymes: No results for input(s): CKTOTAL, CKMB, CKMBINDEX, TROPONINI in the last 168  hours. HbA1C: No results for input(s): HGBA1C in the last 72 hours. CBG: Recent Labs  Lab 01/30/17 1138 01/30/17 1714 01/30/17 2058 01/31/17 0745 01/31/17 1134  GLUCAP 173* 205* 101* 129* 183*    No results found for this or any previous visit (from the past 240 hour(s)).       Radiology Studies: No results found.      Scheduled Meds: . aspirin EC  81 mg Oral Daily  . feeding supplement (ENSURE ENLIVE)  237 mL Oral BID BM  . heparin  5,000 Units Subcutaneous Q8H  . insulin aspart  0-9 Units Subcutaneous TID WC  . levothyroxine  112 mcg Oral QAC breakfast  . metoprolol tartrate  12.5 mg Oral BID  . sodium chloride flush  3 mL Intravenous Q12H  . torsemide  20 mg Oral Daily   Continuous Infusions: . sodium chloride       LOS: 2 days     Nita Sells, MD, FACP, [facility]. [facility] Pager [phone]  If 7PM-7AM, please contact night-coverage [email or web address] Password [facility] 01/31/2017, 12:49 PM

## 2017-01-31 NOTE — Progress Notes (Addendum)
01/31/17 1144  PT Visit Information  Last PT Received On 01/31/17  Assistance Needed +2 (for ambulation)  History of Present Illness 84 year old female patient, lives with her daughter in [place], [place] with the help of a walker, PMH of CAD, ischemic cardiomyopathy (TTE 01/22/17: LVEF 55-60% and grade 1 diastolic dysfunction), stage IV chronic kidney disease, DM 2, HLD, HTN, hypothyroid, vertigo, metastatic urothelial carcinoma, failed immunotherapy and now on palliative systemic chemotherapy with gemcitabine, anemia status post PRBCs, recent hospitalization at [facility] 1/19-1/23 for acute on chronic diastolic CHF, acute on stage IV chronic kidney disease, hypervolemic hyponatremia and Klebsiella UTI, presented to [facility] due to persistent vertigo to a point where patient was unable to safely walk, worse with positional change and better at rest, sodium had worsened from 132-125 and creatinine up to 2.66 from 1.9.  Admitted for further evaluation and management of hyponatremia, acute on stage IV chronic kidney disease and vertigo.  Slowly improving.  Subjective Data  Patient Stated Goal return home with family to assist  Precautions  Precautions Fall  Restrictions  Weight Bearing Restrictions No  Pain Assessment  Pain Assessment No/denies pain  Cognition  Arousal/Alertness Awake/alert  Behavior During Therapy WFL for tasks assessed/performed  Overall Cognitive Status Within Functional Limits for tasks assessed  Bed Mobility  Overal bed mobility Needs Assistance  Bed Mobility Sit to Supine  Sit to supine Min assist  General bed mobility comments Pt was in chair and c/o dizziness continued since PT left.  Assisted pt back to bed and treated the left side for left BPPV with dizziness 10/10 during treatment.  Once pt positioned in bed after treatment, dizziness down to 5/10 per pt at end of treatment.  Hopeful that BPPV treatment was effective and still suspect that pt has a bilateral  hypofunction given that she has taken Meclizine so long and the system has been suppressed.  Showed daughter x1 exercises and informed her to have pt begin those late this evening.  daughter agrees.  Will also try and have another therapist check back this pm to recheck pt.   Transfers  Overall transfer level Needs assistance  Equipment used 2 person hand held assist  Transfers Sit to/from [employer] Transfers  Sit to Stand Min assist  Stand pivot transfers Min assist  General transfer comment Slight steadying assist upon standing and close min steadying assist for pivot transfer as pt was dizzy.    Balance  Overall balance assessment Needs assistance  Sitting-balance support Feet supported;Bilateral upper extremity supported  Sitting balance-Leahy Scale Fair  Standing balance support Bilateral upper extremity supported;During functional activity  Standing balance-Leahy Scale Poor  Standing balance comment relies on UE support for balance  PT - End of Session  Equipment Utilized During Treatment Gait belt  Activity Tolerance Patient limited by fatigue (limited by dizziness)  Patient left with call bell/phone within reach;with family/visitor present;in bed;with bed alarm set  Nurse Communication Mobility status  PT - Assessment/Plan  PT Plan Current plan remains appropriate  PT Visit Diagnosis Unsteadiness on feet (R26.81);Other abnormalities of gait and mobility (R26.89);Muscle weakness (generalized) (M62.81);BPPV;Dizziness and giddiness (R42)  BPPV - Right/Left  (bilateral)  PT Frequency (ACUTE ONLY) Min 3X/week  Follow Up Recommendations Home health PT for vestibular rehab;Supervision for mobility/OOB  PT equipment None recommended by PT  AM-PAC PT "6 Clicks" Daily Activity Outcome Measure  Difficulty turning over in bed (including adjusting bedclothes, sheets and blankets)? 1  Difficulty moving from lying on back to sitting  on the side of the bed?  1  Difficulty sitting down  on and standing up from a chair with arms (e.g., wheelchair, bedside commode, etc,.)? 3  Help needed moving to and from a bed to chair (including a wheelchair)? 3  Help needed walking in hospital room? 1  Help needed climbing 3-5 steps with a railing?  1  6 Click Score 10  Mobility G Code  CL  PT Goal Progression  Progress towards PT goals Progressing toward goals  PT Time Calculation  PT Start Time (ACUTE ONLY) 0955  PT Stop Time (ACUTE ONLY) 1024  PT Time Calculation (min) (ACUTE ONLY) 29 min  PT General Charges  $$ ACUTE PT VISIT 1 Visit  PT Treatments  $Therapeutic Exercise 8-22 mins  $Therapeutic Activity 8-22 mins  Pt was still dizzy upon return to room, therefore assisted back to bed and treated via canalith repositioning for left BPPV.  Pt did report that after she was lying in bed for 10 minutes dizziness was down to a 5/10.  hopeful that pt's dizziness will continue to improve.  However, do suspect that pt has a bilateral hypofunction of vestibular system which will take time to improve with x 1 exercise and PT.  Daughter deciding whether she feels that they can handle pt at home and receive HHPT for vestibular rehab.  Will follow up while pt in hospital and continue to progress as pt is able. Sophia [phone] (pager)

## 2017-02-01 LAB — COMPREHENSIVE METABOLIC PANEL
ALK PHOS: 63 U/L (ref 38–126)
ALT: 15 U/L (ref 14–54)
AST: 45 U/L — AB (ref 15–41)
Albumin: 2.4 g/dL — ABNORMAL LOW (ref 3.5–5.0)
Anion gap: 11 (ref 5–15)
BUN: 17 mg/dL (ref 6–20)
CALCIUM: 8.3 mg/dL — AB (ref 8.9–10.3)
CO2: 28 mmol/L (ref 22–32)
CREATININE: 1.99 mg/dL — AB (ref 0.44–1.00)
Chloride: 85 mmol/L — ABNORMAL LOW (ref 101–111)
GFR calc non Af Amer: 22 mL/min — ABNORMAL LOW (ref >60)
GFR, EST AFRICAN AMERICAN: 26 mL/min — AB (ref >60)
Glucose, Bld: 130 mg/dL — ABNORMAL HIGH (ref 65–99)
Potassium: 3.4 mmol/L — ABNORMAL LOW (ref 3.5–5.1)
SODIUM: 124 mmol/L — AB (ref 135–145)
Total Bilirubin: 0.7 mg/dL (ref 0.3–1.2)
Total Protein: 5.2 g/dL — ABNORMAL LOW (ref 6.5–8.1)

## 2017-02-01 LAB — GLUCOSE, CAPILLARY
Glucose-Capillary: 113 mg/dL — ABNORMAL HIGH (ref 65–99)
Glucose-Capillary: 210 mg/dL — ABNORMAL HIGH (ref 65–99)
Glucose-Capillary: 139 mg/dL — ABNORMAL HIGH (ref 65–99)
Glucose-Capillary: 188 mg/dL — ABNORMAL HIGH (ref 65–99)

## 2017-02-01 MED ORDER — TORSEMIDE 20 MG PO TABS
40.0000 mg | ORAL_TABLET | Freq: Every day | ORAL | Status: DC
  Administered 2017-02-01: 40 mg via ORAL
  Filled 2017-02-01 (×3): qty 2

## 2017-02-01 NOTE — Progress Notes (Signed)
Physical Therapy Treatment Patient Details Name: Joann Hunter MRN: [id number] DOB: 25-Jun-1932 Today's Date: 02/01/2017    History of Present Illness 84 year old female patient, lives with her daughter in [place], [place] with the help of a walker, PMH of CAD, ischemic cardiomyopathy (TTE 01/22/17: LVEF 55-60% and grade 1 diastolic dysfunction), stage IV chronic kidney disease, DM 2, HLD, HTN, hypothyroid, vertigo, metastatic urothelial carcinoma, failed immunotherapy and now on palliative systemic chemotherapy with gemcitabine, anemia status post PRBCs, recent hospitalization at [facility] 1/19-1/23 for acute on chronic diastolic CHF, acute on stage IV chronic kidney disease, hypervolemic hyponatremia and Klebsiella UTI, presented to [facility] due to persistent vertigo to a point where patient was unable to safely walk, worse with positional change and better at rest, sodium had worsened from 132-125 and creatinine up to 2.66 from 1.9.  Admitted for further evaluation and management of hyponatremia, acute on stage IV chronic kidney disease and vertigo.  Slowly improving.    PT Comments    Pt admitted with above diagnosis. Pt currently with functional limitations due to dizziness significantly limiting treatment.  Pt was unable to tolerate getting OOB to chair due to dizziness. Pt reports dizziness (spinning) when performing x1 exercises.  Pt also reports feeling of blacking out and orthostatic BP's taken and pt did drop with position change from sit to stand.  She would not do any further treatment after BP taken and laid back in bed.  Assisted her back to bed.  Daughter states that nurse informed her that her mother's Na was still low and she is worried about pt's dehydration as her urine is dark.  Notified MD of daughter's concern and pt progress with PT.  Will continue as able.   Pt will benefit from skilled PT to increase their independence and safety with mobility to allow discharge to the venue listed  below.    Orthostatic BPs  Supine 135/55, 59 bpm  Sitting 136/59, 84 bpm  Standing 117/45, 82 bpm  Standing after 3 min Unable to tolerate stand 3 min    Follow Up Recommendations  Home health PT;Supervision for mobility/OOB(vestibular rehab)     Equipment Recommendations  None recommended by PT    Recommendations for Other Services       Precautions / Restrictions Precautions Precautions: Fall Restrictions Weight Bearing Restrictions: No    Mobility  Bed Mobility Overal bed mobility: Needs Assistance Bed Mobility: Supine to Sit;Sit to Supine     Supine to sit: Min assist;Mod assist;+2 for physical assistance Sit to supine: Min assist;Mod assist;+2 for physical assistance   General bed mobility comments: pt report dizziness prior to getting to EOB.  Once sitting on EOB dizziness worsened. Upon standing even worse.  Pt was orthostatic slightly therefore ended up lying pt back down as she refused to walk or get in chair.   Transfers Overall transfer level: Needs assistance Equipment used: Rolling walker (2 wheeled) Transfers: Sit to/from [facility] Sit to Stand: Min assist;+2 physical assistance         General transfer comment: Pt refused to transfer to chair.  Did try to perform x1 exercises with pt with pt unable to complete longer than 10 seconds.  Pt also with questionable fluctuations in BP therefore had to lie her down.    Ambulation/Gait             General Gait Details: refused   Science writer  Modified Rankin (Stroke Patients Only)       Balance Overall balance assessment: Needs assistance Sitting-balance support: Feet supported;Bilateral upper extremity supported Sitting balance-Leahy Scale: Fair     Standing balance support: Bilateral upper extremity supported;During functional activity Standing balance-Leahy Scale: Poor Standing balance comment: relies on UE support for balance                             Cognition Arousal/Alertness: Awake/alert Behavior During Therapy: WFL for tasks assessed/performed Overall Cognitive Status: Within Functional Limits for tasks assessed                                        Exercises Other Exercises Other Exercises: x1 exercises - encouraged pt and daughter to perform 4 x day.    General Comments        Pertinent Vitals/Pain Pain Assessment: No/denies pain    Home Living                      Prior Function            PT Goals (current goals can now be found in the care plan section) Progress towards PT goals: Progressing toward goals    Frequency    Min 3X/week      PT Plan Current plan remains appropriate    Co-evaluation              AM-PAC PT "6 Clicks" Daily Activity  Outcome Measure  Difficulty turning over in bed (including adjusting bedclothes, sheets and blankets)?: Unable Difficulty moving from lying on back to sitting on the side of the bed? : Unable Difficulty sitting down on and standing up from a chair with arms (e.g., wheelchair, bedside commode, etc,.)?: A Little Help needed moving to and from a bed to chair (including a wheelchair)?: A Little Help needed walking in hospital room?: Total Help needed climbing 3-5 steps with a railing? : Total 6 Click Score: 10    End of Session Equipment Utilized During Treatment: Gait belt Activity Tolerance: Patient limited by fatigue(limited by dizziness) Patient left: with call bell/phone within reach;with family/visitor present;in bed;with bed alarm set Nurse Communication: Mobility status PT Visit Diagnosis: Unsteadiness on feet (R26.81);Other abnormalities of gait and mobility (R26.89);Muscle weakness (generalized) (M62.81);BPPV;Dizziness and giddiness (R42) BPPV - Right/Left : (bilateral)     Time: 5809-9833 PT Time Calculation (min) (ACUTE ONLY): 31 min  Charges:  $Therapeutic Exercise: 8-22  mins $Therapeutic Activity: 8-22 mins                    G Codes:       [place] [phone] (pager)    Denice Paradise 02/01/2017, 10:14 AM

## 2017-02-01 NOTE — Progress Notes (Signed)
PROGRESS NOTE   Joann Hunter  BCW:[id number]    DOB: 01-Mar-1932    DOA: 01/28/2017  PCP: Monico Blitz, MD   I have briefly reviewed patients previous medical records in [facility].  Brief Narrative:  84 year old female patient, lives with her daughter in [place], [place] with the help of a walker, PMH of CAD, ischemic cardiomyopathy (TTE 01/22/17: LVEF 55-60% and grade 1 diastolic dysfunction), stage IV chronic kidney disease, DM 2, HLD, HTN, hypothyroid, vertigo, metastatic urothelial carcinoma, failed immunotherapy and now on palliative systemic chemotherapy with gemcitabine, anemia status post PRBCs, recent hospitalization at [facility] 1/19-1/23 for acute on chronic diastolic CHF, acute on stage IV chronic kidney disease, hypervolemic hyponatremia and Klebsiella UTI, presented to [facility] due to persistent vertigo to a point where patient was unable to safely walk, worse with positional change and better at rest, sodium had worsened from 132--->125 and creatinine up to 2.66 from 1.9.  Admitted for further evaluation and management of hyponatremia, acute on stage IV chronic kidney disease and vertigo.  Slowly improving.   Assessment & Plan:   Principal Problem:   Hyponatremia Active Problems:   Acute kidney injury superimposed on chronic kidney disease (HCC)   Chronic kidney disease (CKD), stage IV (severe) (HCC)   Vertigo   Type 2 diabetes with nephropathy (HCC)   Urothelial cancer ([place])   AKI (acute kidney injury) ([place])   1. Vertigo: Acute on chronic.  Possibly BPPV.  Orthostatic hypotension may be contributing some.  BP 122/46 (lying) >139/52 (sitting) >105/58 (standing).  MRI brain without acute abnormality.  vestib rehab shows definite BPPV--will need hh BPPV and PT 2. Hyponatremia: Subacute on chronic.  DD: Hypervolemic hyponatremia versus SIADH.  Appears mildly volume overloaded.  Her sodium is usually in the low to mid 30s. sopdium still 124- Fluid restriction 1200, -3.3  liters out, continue torsemide at 40.  Sodium 20, osm 408--monitor-liberalize diet 3. Acute on stage IV chronic kidney disease: Baseline creatinine may be in the 1.8-2 range.  During recent admission, felt to be cardiorenal in etiology, improved with Lasix, off of diuretics for a few days while at home, returned with creatinine of 2.66.    Creatinine has improved to 2.13-->1.99.  Follow BMP.  Improving.   4. Essential hypertension: Controlled.  Mildly orthostatic.  Scale back metoprolol and hold today.  .  DC amlodipine which can cause orthostasis.  5. Type II DM with renal complications: Mildly uncontrolled and fluctuating.  Continue NovoLog SSI.  Sugars 129-183 6. Hypothyroid: TSH 2.347 on 12/27.  Continue Synthroid. 7. Anemia: Multifactorial: Chronic disease, malignancy, chemotherapy, chronic kidney disease.  Status post recent PRBC transfusion.  Stable. 8. Hypokalemia: Replaced. 9. Chronic diastolic CHF: Cardiomyopathy seems to have resolved.  Mild volume overload.  Continue oral torsemide. 10. Metastatic urothelial carcinoma: Outpatient follow-up with oncology/Dr. Lebron Conners. 11. Recent Klebsiella UTI: No urinary symptoms, fever or leukocytosis.  Was on 5 days of appropriate antibiotics during recent admission in 2 days of Rocephin this admission.  Completed course.  Discontinued Rocephin.   DVT prophylaxis: Subcutaneous heparin Code Status: Full Family Communication: Discussed in detail with patient's daughter at bedside. Disposition: DC home possibly 1/30 when sodium more stable   Consultants:  None  Procedures:  None  Antimicrobials:  IV ceftriaxone-discontinued 1/28   Subjective:  Dizzy Daughter concerned about dehydration No cp Ate minimally Many bottels at bedsdie   Objective:  Vitals:   01/31/17 1714 01/31/17 2008 02/01/17 0500 02/01/17 0850  BP: (!) 128/50 Marland Kitchen)  102/53  (!) 112/43  Pulse: 69 73  72  Resp: 18 19  18   Temp: 98.3 F (36.8 C) 98.5 F (36.9 C)  98 F  (36.7 C)  TempSrc: Oral Oral  Oral  SpO2: 97% 94%  98%  Weight:  76.3 kg (168 lb 3.4 oz) 76.3 kg (168 lb 3.4 oz)   Height:        Examination:  General exam: Pleasant elderly female, moderately built and nourished, lying comfortably propped up in bed.  Oral mucosa moist. Respiratory system: Clear to auscultation. Respiratory effort normal. Cardiovascular system: S1 & S2 heard, RRR. No JVD, murmurs, rubs, gallops or clicks.  Trace ankle edema.  Telemetry personally reviewed: Sinus rhythm. Gastrointestinal system: Abdomen is nondistended, soft and nontender. No organomegaly or masses felt. Normal bowel sounds heard. Central nervous system: Alert and oriented. No focal neurological deficits.  Hard of hearing. Extremities: Symmetric 5 x 5 power. Skin: No rashes, lesions or ulcers Psychiatry: Judgement and insight impaired. Mood & affect flat.     Data Reviewed: I have personally reviewed following labs and imaging studies  CBC: Recent Labs  Lab 01/26/17 1030 01/28/17 1730 01/31/17 0435  WBC 7.2 6.4 4.4  NEUTROABS 4.7 3.8  --   HGB 10.5* 11.8* 11.0*  HCT 32.6* 35.2* 32.9*  MCV 98.5 93.1 94.5  PLT 189 214 161   Basic Metabolic Panel: Recent Labs  Lab 01/28/17 1730 01/29/17 0545 01/30/17 0427 01/31/17 0435 02/01/17 0428  NA 125* 125* 127* 128* 124*  K 3.6 3.1* 3.7 3.8 3.4*  CL 85* 86* 87* 87* 85*  CO2 29 26 29 30 28   GLUCOSE 168* 181* 154* 131* 130*  BUN 24* 23* 20 19 17   CREATININE 2.66* 2.29* 2.13* 2.14* 1.99*  CALCIUM 8.4* 8.0* 8.1* 8.4* 8.3*  MG  --   --   --  1.6*  --    Liver Function Tests: Recent Labs  Lab 01/28/17 1730 02/01/17 0428  AST 32 45*  ALT 12* 15  ALKPHOS 79 63  BILITOT 0.8 0.7  PROT 6.1* 5.2*  ALBUMIN 2.6* 2.4*   Coagulation Profile: Recent Labs  Lab 01/28/17 2350  INR 1.07   Cardiac Enzymes: No results for input(s): CKTOTAL, CKMB, CKMBINDEX, TROPONINI in the last 168 hours. HbA1C: No results for input(s): HGBA1C in the last 72  hours. CBG: Recent Labs  Lab 01/31/17 0745 01/31/17 1134 01/31/17 1713 01/31/17 2006 02/01/17 0735  GLUCAP 129* 183* 169* 200* 113*    No results found for this or any previous visit (from the past 240 hour(s)).       Radiology Studies: No results found.      Scheduled Meds: . aspirin EC  81 mg Oral Daily  . feeding supplement (ENSURE ENLIVE)  237 mL Oral BID BM  . heparin  5,000 Units Subcutaneous Q8H  . insulin aspart  0-9 Units Subcutaneous TID WC  . levothyroxine  112 mcg Oral QAC breakfast  . metoprolol tartrate  12.5 mg Oral BID  . sodium chloride flush  3 mL Intravenous Q12H  . torsemide  40 mg Oral Daily   Continuous Infusions: . sodium chloride       LOS: 3 days     Nita Sells, MD, FACP, [facility]. [facility] Pager [phone]  If 7PM-7AM, please contact night-coverage [email or web address] Password [email or web address] 02/01/2017, 11:11 AM

## 2017-02-02 LAB — RENAL FUNCTION PANEL
Albumin: 2.4 g/dL — ABNORMAL LOW (ref 3.5–5.0)
Anion gap: 10 (ref 5–15)
BUN: 18 mg/dL (ref 6–20)
CALCIUM: 8.3 mg/dL — AB (ref 8.9–10.3)
CO2: 29 mmol/L (ref 22–32)
CREATININE: 2.01 mg/dL — AB (ref 0.44–1.00)
Chloride: 86 mmol/L — ABNORMAL LOW (ref 101–111)
GFR calc non Af Amer: 22 mL/min — ABNORMAL LOW (ref >60)
GFR, EST AFRICAN AMERICAN: 25 mL/min — AB (ref >60)
GLUCOSE: 121 mg/dL — AB (ref 65–99)
Phosphorus: 3.8 mg/dL (ref 2.5–4.6)
Potassium: 3.3 mmol/L — ABNORMAL LOW (ref 3.5–5.1)
SODIUM: 125 mmol/L — AB (ref 135–145)

## 2017-02-02 LAB — GLUCOSE, CAPILLARY
Glucose-Capillary: 127 mg/dL — ABNORMAL HIGH (ref 65–99)
Glucose-Capillary: 188 mg/dL — ABNORMAL HIGH (ref 65–99)
Glucose-Capillary: 177 mg/dL — ABNORMAL HIGH (ref 65–99)
Glucose-Capillary: 212 mg/dL — ABNORMAL HIGH (ref 65–99)

## 2017-02-02 MED ORDER — SODIUM CHLORIDE 0.9 % IV SOLN
INTRAVENOUS | Status: DC
  Administered 2017-02-02 – 2017-02-03 (×2): via INTRAVENOUS

## 2017-02-02 MED ORDER — POTASSIUM CHLORIDE CRYS ER 20 MEQ PO TBCR
40.0000 meq | EXTENDED_RELEASE_TABLET | Freq: Every day | ORAL | Status: DC
  Administered 2017-02-02 – 2017-02-03 (×2): 40 meq via ORAL
  Filled 2017-02-02 (×2): qty 2

## 2017-02-02 MED ORDER — FUROSEMIDE 10 MG/ML IJ SOLN
60.0000 mg | Freq: Once | INTRAMUSCULAR | Status: AC
  Administered 2017-02-02: 60 mg via INTRAVENOUS
  Filled 2017-02-02: qty 6

## 2017-02-02 MED ORDER — TRAZODONE HCL 50 MG PO TABS
50.0000 mg | ORAL_TABLET | Freq: Once | ORAL | Status: AC
  Administered 2017-02-02: 50 mg via ORAL
  Filled 2017-02-02: qty 1

## 2017-02-02 NOTE — Progress Notes (Signed)
PROGRESS NOTE   Joann Hunter  XFG:[id number]    DOB: [birth year]    DOA: 01/28/2017  PCP: Joann Blitz, MD   I have briefly reviewed patients previous medical records in [facility].  Brief Narrative:  60 wf, ambulates with the help of a walker, PMH of CAD, ischemic cardiomyopathy (TTE 01/22/17: LVEF 55-60% and grade 1 diastolic dysfunction), stage IV chronic kidney disease, DM 2, HLD, HTN, hypothyroid, vertigo, metastatic urothelial carcinoma, failed immunotherapy and now on palliative systemic chemotherapy with gemcitabine, anemia status post PRBCs, recent hospitalization at [facility] 1/19-1/23 for acute on chronic diastolic CHF, acute on stage IV chronic kidney disease, hypervolemic hyponatremia and Klebsiella UTI, presented to [facility] due to persistent vertigo to a point where patient was unable to safely walk, worse with positional change and better at rest, sodium had worsened from 132--->125 and creatinine up to 2.66 from 1.9.  Admitted for further evaluation and management of hyponatremia, acute on stage IV chronic kidney disease and vertigo.  Slowly improving.   Assessment & Plan:   Principal Problem:   Hyponatremia Active Problems:   Acute kidney injury superimposed on chronic kidney disease (HCC)   Chronic kidney disease (CKD), stage IV (severe) (HCC)   Vertigo   Type 2 diabetes with nephropathy (HCC)   Urothelial cancer (HCC)   AKI (acute kidney injury) ([place])   Vertigo: Acute on chronic.  Possibly BPPV.  Orthostatic hypotension may be contributing some.  BP 122/46 (lying) >139/52 (sitting) >105/58 (standing).  MRI brain without acute abnormality.  vestib rehab shows definite BPPV--will need hh BPPV and PT--still not ready for d/c Hyponatremia: DD: Hypervolemic hyponatremia versus SIADH.  Appears mildly volume overloaded.  Her sodium is usually in the low to mid 130s. sodium still 125- Fluid restriction 1200, -3.3 liters out-add NS 50 cc/h. Change torsemide Lasix 60 x 1 and  observe-not ready for home Acute on stage IV chronic kidney disease: Baseline creatinine may be in the 1.8-2 range.   felt to be cardiorenal in etiology, improved with Lasix, off of diuretics for a few days while at home, returned with creatinine of 2.66.    Creatinine has improved to 2.13-->2.0 Essential hypertension: Controlled.  Mildly orthostatic.  Metoprolol on hold 1/30.  DC amlodipine which can cause orthostasis.  Type II DM with renal complications: Mildly uncontrolled and fluctuating.  Continue NovoLog SSI.  Sugars 127-188 Hypothyroid: TSH 2.347 on 12/27.  Continue Synthroid. Anemia: Multifactorial: Chronic disease, malignancy, chemotherapy, chronic kidney disease.  Status post recent PRBC transfusion.  Stable. Hypokalemia: Replaced. Chronic diastolic CHF: Cardiomyopathy seems to have resolved.  Mild volume overload.  See above Metastatic urothelial carcinoma: Outpatient follow-up with oncology/Dr. Lebron Hunter. Recent Klebsiella UTI: No urinary symptoms, fever or leukocytosis.  Was on 5 days of appropriate antibiotics during recent admission in 2 days of Rocephin this admission.  Completed course.  Discontinued Rocephin.   DVT prophylaxis: Subcutaneous heparin Code Status: Full Family Communication: Discussed in detail with patient's daughter at bedside. Disposition: DC home possibly 1/30 when sodium more stable   Consultants:  None  Procedures:  None  Antimicrobials:  IV ceftriaxone-discontinued 1/28   Subjective:  Awake alert in nad Looks overall improved sitting up in bed and eating Less dizzy no sob No fever   Objective:  Vitals:   02/01/17 0850 02/01/17 2140 02/02/17 0454 02/02/17 0500  BP: (!) 112/43 (!) 120/47 (!) 140/54   Pulse: 72 74 73   Resp: 18 17 18    Temp: 98 F (36.7 C) 98  F (36.7 C) 97.9 F (36.6 C)   TempSrc: Oral Oral Oral   SpO2: 98% 100% 96%   Weight:  76.1 kg (167 lb 12.3 oz)  76.1 kg (167 lb 12.3 oz)  Height:         Examination:   eomi smiling sitting up slight hoh cta b s1 s2 no m/r/g abd soft nt nd no rebound No le edema ROM intact   Data Reviewed: I have personally reviewed following labs and imaging studies  CBC: Recent Labs  Lab 01/26/17 1030 01/28/17 1730 01/31/17 0435  WBC 7.2 6.4 4.4  NEUTROABS 4.7 3.8  --   HGB 10.5* 11.8* 11.0*  HCT 32.6* 35.2* 32.9*  MCV 98.5 93.1 94.5  PLT 189 214 662   Basic Metabolic Panel: Recent Labs  Lab 01/29/17 0545 01/30/17 0427 01/31/17 0435 02/01/17 0428 02/02/17 0249  NA 125* 127* 128* 124* 125*  K 3.1* 3.7 3.8 3.4* 3.3*  CL 86* 87* 87* 85* 86*  CO2 26 29 30 28 29   GLUCOSE 181* 154* 131* 130* 121*  BUN 23* 20 19 17 18   CREATININE 2.29* 2.13* 2.14* 1.99* 2.01*  CALCIUM 8.0* 8.1* 8.4* 8.3* 8.3*  MG  --   --  1.6*  --   --   PHOS  --   --   --   --  3.8   Liver Function Tests: Recent Labs  Lab 01/28/17 1730 02/01/17 0428 02/02/17 0249  AST 32 45*  --   ALT 12* 15  --   ALKPHOS 79 63  --   BILITOT 0.8 0.7  --   PROT 6.1* 5.2*  --   ALBUMIN 2.6* 2.4* 2.4*   Coagulation Profile: Recent Labs  Lab 01/28/17 2350  INR 1.07   Cardiac Enzymes: No results for input(s): CKTOTAL, CKMB, CKMBINDEX, TROPONINI in the last 168 hours. HbA1C: No results for input(s): HGBA1C in the last 72 hours. CBG: Recent Labs  Lab 02/01/17 0735 02/01/17 1138 02/01/17 1734 02/01/17 2136 02/02/17 0805  GLUCAP 113* 210* 139* 188* 127*    No results found for this or any previous visit (from the past 240 hour(s)).       Radiology Studies: No results found.      Scheduled Meds: . aspirin EC  81 mg Oral Daily  . feeding supplement (ENSURE ENLIVE)  237 mL Oral BID BM  . heparin  5,000 Units Subcutaneous Q8H  . insulin aspart  0-9 Units Subcutaneous TID WC  . levothyroxine  112 mcg Oral QAC breakfast  . potassium chloride  40 mEq Oral Daily  . sodium chloride flush  3 mL Intravenous Q12H   Continuous Infusions: . sodium  chloride    . sodium chloride 50 mL/hr at 02/02/17 0931     LOS: 4 days     Joann Sells, MD, FACP, [facility]. [facility] Pager [phone]  If 7PM-7AM, please contact night-coverage [email or web address] Password [email or web address] 02/02/2017, 10:01 AM

## 2017-02-02 NOTE — Progress Notes (Signed)
Occupational Therapy Treatment Patient Details Name: Joann Hunter MRN: [id number] DOB: 1932/01/12 Today's Date: 02/02/2017    History of present illness 84 year old female patient, lives with her daughter in [place], [place] with the help of a walker, PMH of CAD, ischemic cardiomyopathy (TTE 01/22/17: LVEF 55-60% and grade 1 diastolic dysfunction), stage IV chronic kidney disease, DM 2, HLD, HTN, hypothyroid, vertigo, metastatic urothelial carcinoma, failed immunotherapy and now on palliative systemic chemotherapy with gemcitabine, anemia status post PRBCs, recent hospitalization at [facility] 1/19-1/23 for acute on chronic diastolic CHF, acute on stage IV chronic kidney disease, hypervolemic hyponatremia and Klebsiella UTI, presented to [facility] due to persistent vertigo to a point where patient was unable to safely walk, worse with positional change and better at rest, sodium had worsened from 132-125 and creatinine up to 2.66 from 1.9.  Admitted for further evaluation and management of hyponatremia, acute on stage IV chronic kidney disease and vertigo.  Slowly improving.   OT comments  Pt requiring maximum encouragement and education on benefits of OOB for toileting and sitting in chair, but completed with assistance. Reports intermittent dizziness. No family in room. Pt agreeable to remaining up in chair with goal of one hour.  Follow Up Recommendations  Home health OT;Supervision/Assistance - 24 hour    Equipment Recommendations  3 in 1 bedside commode;Tub/shower seat    Recommendations for Other Services      Precautions / Restrictions Precautions Precautions: Fall Restrictions Weight Bearing Restrictions: No       Mobility Bed Mobility Overal bed mobility: Needs Assistance Bed Mobility: Supine to Sit     Supine to sit: Min guard    General bed mobility comments: no physical assist, increased time, HOB up 40 degrees  Transfers Overall transfer level: Needs  assistance Equipment used: 1 person hand held assist Transfers: Sit to/from [facility] Sit to Stand: Min guard Stand pivot transfers: Mod assist       General transfer comment: bed>3 in 1>chair    Balance Overall balance assessment: Needs assistance Sitting-balance support: Feet supported;Bilateral upper extremity supported Sitting balance-Leahy Scale: Fair     Standing balance support: Bilateral upper extremity supported;During functional activity Standing balance-Leahy Scale: Poor Standing balance comment: relies on UE support for balance                           ADL either performed or assessed with clinical judgement   ADL Overall ADL's : Needs assistance/impaired     Grooming: Brushing hair;Sitting;Set up                   Toilet Transfer: Moderate assistance;Stand-pivot;BSC   Toileting- Clothing Manipulation and Hygiene: Maximal assistance;Sit to/from stand         General ADL Comments: Pt self limiting with c/o of dizziness which resolves quickly upon sitting down. No dizziness with supine to sit.     Vision       Perception     Praxis      Cognition Arousal/Alertness: Awake/alert Behavior During Therapy: Flat affect Overall Cognitive Status: Within Functional Limits for tasks assessed                                          Exercises     Shoulder Instructions       General Comments      Pertinent  Vitals/ Pain       Pain Assessment: No/denies pain  Home Living                                          Prior Functioning/Environment              Frequency  Min 2X/week        Progress Toward Goals  OT Goals(current goals can now be found in the care plan section)  Progress towards OT goals: Progressing toward goals  Acute Rehab OT Goals Patient Stated Goal: return home with family to assist OT Goal Formulation: With patient/family Time For Goal  Achievement: 02/14/17 Potential to Achieve Goals: Good  Plan Discharge plan remains appropriate    Co-evaluation                 AM-PAC PT "6 Clicks" Daily Activity     Outcome Measure   Help from another person eating meals?: None Help from another person taking care of personal grooming?: A Little Help from another person toileting, which includes using toliet, bedpan, or urinal?: A Lot Help from another person bathing (including washing, rinsing, drying)?: A Lot Help from another person to put on and taking off regular upper body clothing?: A Little Help from another person to put on and taking off regular lower body clothing?: A Lot 6 Click Score: 16    End of Session Equipment Utilized During Treatment: Gait belt  OT Visit Diagnosis: Unsteadiness on feet (R26.81);Muscle weakness (generalized) (M62.81);Dizziness and giddiness (R42)   Activity Tolerance (self limiting and reporting dizziness)   Patient Left in chair;with call bell/phone within reach;with chair alarm set   Nurse Communication          Time: [id number] OT Time Calculation (min): 22 min  Charges: OT Treatments $Self Care/Home Management : 8-22 mins  02/02/2017 Nestor Lewandowsky, OTR/L Pager: [phone]   Werner Lean Haze Boyden 02/02/2017, 3:44 PM

## 2017-02-02 NOTE — Progress Notes (Signed)
Physical Therapy Treatment Patient Details Name: Joann Hunter MRN: [id number] DOB: Dec 24, 1932 Today's Date: 02/02/2017    History of Present Illness 84 year old female patient, lives with her daughter in [place], [place] with the help of a walker, PMH of CAD, ischemic cardiomyopathy (TTE 01/22/17: LVEF 55-60% and grade 1 diastolic dysfunction), stage IV chronic kidney disease, DM 2, HLD, HTN, hypothyroid, vertigo, metastatic urothelial carcinoma, failed immunotherapy and now on palliative systemic chemotherapy with gemcitabine, anemia status post PRBCs, recent hospitalization at [facility] 1/19-1/23 for acute on chronic diastolic CHF, acute on stage IV chronic kidney disease, hypervolemic hyponatremia and Klebsiella UTI, presented to [facility] due to persistent vertigo to a point where patient was unable to safely walk, worse with positional change and better at rest, sodium had worsened from 132-125 and creatinine up to 2.66 from 1.9.  Admitted for further evaluation and management of hyponatremia, acute on stage IV chronic kidney disease and vertigo.  Slowly improving.    PT Comments    Pt admitted with above diagnosis. Pt currently with functional limitations due to balance and endurance deficits. Pt self limiting and refusing at first.  Explained to pt that she needed to move as mobility helps with healing and fluid management as well as other areas.  Pt orthostatic BPs taken as well and she continues to be orthostatic with symptoms.  Tried to get pt to sit up but pt would not.  Assisted her back to bed.  Did not perform any vestibular treatment as pt felt too bad after just standing up.   Pt will benefit from skilled PT to increase their independence and safety with mobility to allow discharge to the venue listed below. Orthostatic BPs  Supine 140/113, 86 bpm  Sitting 120/66, 74 bpm  Standing 107/47, 101 bpm  Standing after 3 min Could not stand 3 minutes       Follow Up Recommendations  Home  health PT;Supervision for mobility/OOB(vestibular rehab)     Equipment Recommendations  None recommended by PT    Recommendations for Other Services       Precautions / Restrictions Precautions Precautions: Fall Restrictions Weight Bearing Restrictions: No    Mobility  Bed Mobility Overal bed mobility: Needs Assistance Bed Mobility: Supine to Sit;Sit to Supine     Supine to sit: Min assist Sit to supine: Min assist   General bed mobility comments: pt report dizziness prior to getting to EOB.  Even though pt reports dizziness, she was able to sit without UE support. Pt does report incr dizziness with each position change and did have orthostatic BP's.  Pt refused to sit in chair and refused to ambulate. Spent incr time explaining the benefits of sitting in chair but pt refused adamantly.   Transfers Overall transfer level: Needs assistance Equipment used: Rolling walker (2 wheeled) Transfers: Sit to/from Stand Sit to Stand: Min guard         General transfer comment: Pt refused to transfer to chair.    Ambulation/Gait                 Stairs            Wheelchair Mobility    Modified Rankin (Stroke Patients Only)       Balance Overall balance assessment: Needs assistance Sitting-balance support: Feet supported;Bilateral upper extremity supported Sitting balance-Leahy Scale: Fair     Standing balance support: Bilateral upper extremity supported;During functional activity Standing balance-Leahy Scale: Poor Standing balance comment: relies on UE support for balance  Cognition Arousal/Alertness: Awake/alert Behavior During Therapy: WFL for tasks assessed/performed Overall Cognitive Status: Within Functional Limits for tasks assessed                                        Exercises      General Comments        Pertinent Vitals/Pain Pain Assessment: No/denies pain    Home Living                       Prior Function            PT Goals (current goals can now be found in the care plan section) Acute Rehab PT Goals Patient Stated Goal: return home with family to assist Progress towards PT goals: Progressing toward goals    Frequency    Min 3X/week      PT Plan Current plan remains appropriate    Co-evaluation              AM-PAC PT "6 Clicks" Daily Activity  Outcome Measure  Difficulty turning over in bed (including adjusting bedclothes, sheets and blankets)?: Unable Difficulty moving from lying on back to sitting on the side of the bed? : Unable Difficulty sitting down on and standing up from a chair with arms (e.g., wheelchair, bedside commode, etc,.)?: A Little Help needed moving to and from a bed to chair (including a wheelchair)?: A Little Help needed walking in hospital room?: Total Help needed climbing 3-5 steps with a railing? : Total 6 Click Score: 10    End of Session Equipment Utilized During Treatment: Gait belt Activity Tolerance: Patient limited by fatigue(limited by dizziness, pt self limiting.) Patient left: with call bell/phone within reach;with family/visitor present;in bed;with bed alarm set Nurse Communication: Mobility status PT Visit Diagnosis: Unsteadiness on feet (R26.81);Other abnormalities of gait and mobility (R26.89);Muscle weakness (generalized) (M62.81);BPPV;Dizziness and giddiness (R42) BPPV - Right/Left : (bilateral)     Time: 3825-0539 PT Time Calculation (min) (ACUTE ONLY): 26 min  Charges:  $Therapeutic Activity: 23-37 mins                    G Codes:       Tadarius Maland,PT Acute Rehabilitation [phone] (pager)    Denice Paradise 02/02/2017, 1:22 PM

## 2017-02-02 NOTE — Care Management Note (Addendum)
Case Management Note  Patient Details  Name: NATOYA VISCOMI MRN: [id number] Date of Birth: January 21, 1932               Expected Discharge Plan:  [place]  Discharge planning Services  CM Consult, Other - See comment(Patient is actively receiving chemo at the [place] [place])  [place] Choice:  [place] Choice offered to:  Patient, Adult Children(Cathy Chowning [phone])  DME Arranged:   PT/RN aide DME Agency:   [facility]  HH Arranged:  RN, PT, OT(Anticipate transitional care needs to resume [place] services adding RN, OT discipline with [facility] who patient is active with) [place] Agency:  [facility](Actively set up to go to do initial visit once patient is discharged )  Status of Service:  In process, will continue to follow  If discussed at Long Length of Stay Meetings, dates discussed:   Case was discussed in Length of Stay, per Dr. Reynaldo Minium __[facility]_ was recommended Referral called to donna with [facility].    Additional Comments: In to speak with patient, daughter and granddaughter at bedside. Permission given to speak in front of family. Discussed recommendation of "[facility]"-HH PT/OT, RN/aide. Offered choice of [facility] and selected [facility] with the understanding [facility] will not be able to provide OT service due to availability.  Offered to set up OT with alternate agency and family refused at this time.  Referral called to Butch Penny with [facility] for RN, PT, Aide.   Kristen Cardinal, RN  Nurse case manager [phone] 02/02/2017, 10:35 AM

## 2017-02-03 LAB — GLUCOSE, CAPILLARY: Glucose-Capillary: 131 mg/dL — ABNORMAL HIGH (ref 65–99)

## 2017-02-03 LAB — RENAL FUNCTION PANEL
Albumin: 2.5 g/dL — ABNORMAL LOW (ref 3.5–5.0)
Anion gap: 11 (ref 5–15)
BUN: 19 mg/dL (ref 6–20)
CHLORIDE: 93 mmol/L — AB (ref 101–111)
CO2: 25 mmol/L (ref 22–32)
Calcium: 8.5 mg/dL — ABNORMAL LOW (ref 8.9–10.3)
Creatinine, Ser: 2.12 mg/dL — ABNORMAL HIGH (ref 0.44–1.00)
GFR calc Af Amer: 24 mL/min — ABNORMAL LOW (ref >60)
GFR, EST NON AFRICAN AMERICAN: 21 mL/min — AB (ref >60)
Glucose, Bld: 142 mg/dL — ABNORMAL HIGH (ref 65–99)
POTASSIUM: 3.8 mmol/L (ref 3.5–5.1)
Phosphorus: 3.1 mg/dL (ref 2.5–4.6)
Sodium: 129 mmol/L — ABNORMAL LOW (ref 135–145)

## 2017-02-03 MED ORDER — HEPARIN SOD (PORK) LOCK FLUSH 100 UNIT/ML IV SOLN
500.0000 [IU] | INTRAVENOUS | Status: AC | PRN
  Administered 2017-02-03: 500 [IU]

## 2017-02-03 MED ORDER — TORSEMIDE 20 MG PO TABS: 40.0000 mg | ORAL_TABLET | Freq: Every day | ORAL | 0 refills | Status: DC

## 2017-02-03 NOTE — Discharge Summary (Signed)
Physician Discharge Summary  Joann Hunter IEP:[id number] DOB: [birth year] DOA: 01/28/2017  PCP: Monico Blitz, MD  Admit date: 01/28/2017 Discharge date: 02/03/2017  Time spent: 25 minutes  Recommendations for Outpatient Follow-up:  1. stoppind amlodipine and metoprolol this admit 2. Continue torsemide 3. Needs hh pt/rn/aide and dme 3-in-1 and tub 4. Needs bmet in 4-5 days and further education re: hypervol hyponatremia and limiting fluids  Discharge Diagnoses:  Principal Problem:   Hyponatremia Active Problems:   Acute kidney injury superimposed on chronic kidney disease (HCC)   Chronic kidney disease (CKD), stage IV (severe) (HCC)   Vertigo   Type 2 diabetes with nephropathy (HCC)   Urothelial cancer ([place])   AKI (acute kidney injury) [facility])   Discharge Condition: improved  Diet recommendation: regular  Filed Weights   02/02/17 0500 02/02/17 2109 02/03/17 0226  Weight: 76.1 kg (167 lb 12.3 oz) 76.1 kg (167 lb 12.4 oz) 76.1 kg (167 lb 12.3 oz)    History of present illness:  3 wf, ambulates with the help of a walker, PMH of CAD, ischemic cardiomyopathy (TTE 01/22/17: LVEF 55-60% and grade 1 diastolic dysfunction), stage IV chronic kidney disease, DM 2, HLD, HTN, hypothyroid, vertigo, metastatic urothelial carcinoma, failed immunotherapy and now on palliative systemic chemotherapy with gemcitabine, anemia status post PRBCs, recent hospitalization at [facility] 1/19-1/23 for acute on chronic diastolic CHF, acute on stage IV chronic kidney disease, hypervolemic hyponatremia and Klebsiella UTI, presented to [facility] due to persistent vertigo to a point where patient was unable to safely walk, worse with positional change and better at rest, sodium had worsened from 132--->125 and creatinine up to 2.66 from 1.9.  Admitted for further evaluation and management of hyponatremia, acute on stage IV chronic kidney disease and vertigo.  Slowly improving.   Hospital Course:  Vertigo: Acute on chronic.   Possibly BPPV.  Orthostatic hypotension may be contributing some.  BP 122/46 (lying) >139/52 (sitting) >105/58 (standing).  MRI brain without acute abnormality.  vestib rehab shows definite BPPV--will need hh BPPV and PT.  More abel to sit up in bed on d/c and eat--explained will need further Vestib therapy with PT regarding vestib rehab on d/c home Hyponatremia: DD: Hypervolemic hyponatremia   Appears mildly volume overloaded.  Her sodium is usually in the low to mid 130s. sodium 129 on d/c- Fluid restriction 1200, -2.3 liters out-resume torsemide 40 on d/c--transiently on IV lasix which helped aquarese Acute on stage IV chronic kidney disease: Baseline creatinine may be in the 1.8-2 range.   felt to be cardiorenal in etiology, improved with Lasix, off of diuretics for a few days while at home, returned with creatinine of 2.66.    Creatinine has improved to 2.12 on d/w Essential hypertension: Controlled.  Mildly orthostatic.  Metoprolol on hold 1/30.  DC amlodipine which can cause orthostasis. --ortho stasis improved on f/u Type II DM with renal complications: Mildly uncontrolled and fluctuating.  Continue NovoLog SSI.  Sugars 127-188 Hypothyroid: TSH 2.347 on 12/27.  Continue Synthroid. Anemia: Multifactorial: Chronic disease, malignancy, chemotherapy, chronic kidney disease.  Status post recent PRBC transfusion.  Stable. Hypokalemia: Replaced. Chronic diastolic CHF: Cardiomyopathy seems to have resolved.  Mild volume overload.  See above Metastatic urothelial carcinoma: Outpatient follow-up with oncology/Dr. Lebron Conners. Recent Klebsiella UTI: No urinary symptoms, fever or leukocytosis.  Was on 5 days of appropriate antibiotics during recent admission in 2 days of Rocephin this admission.  Completed course.  Discontinued Rocephin.    Procedures:  none   Consultations:  none  Discharge Exam: Vitals:   02/03/17 0435 02/03/17 0753  BP: (!) 130/45 (!) 131/57  Pulse: 86 90  Resp: 19 18  Temp: 98  F (36.7 C) 98 F (36.7 C)  SpO2: 98% 97%    General: awake alert sitting up some but mild dizzy--hasnt really been able to stand much Cardiovascular: s1 s2 no m/r/g Respiratory: clear no added sound abd soft nt nd no reboun no gaurd  Discharge Instructions   Discharge Instructions    Diet - low sodium heart healthy   Complete by:  As directed    Discharge instructions   Complete by:  As directed    Do not take amlodipine/metorprolol Take torsemide-get labs in 3-5 days We will order home health for Vestibular rehab   Increase activity slowly   Complete by:  As directed      Allergies as of 02/03/2017      Reactions   Ciprofloxacin Other (See Comments)   Hallucination   Nitrofurantoin Other (See Comments)   Dizziness: Resulting in a fall   Lipitor [atorvastatin] Other (See Comments)   Muscle Weakness   Penicillins Rash   Has patient had a PCN reaction causing immediate rash, facial/tongue/throat swelling, SOB or lightheadedness with hypotension: unknown Has patient had a PCN reaction causing severe rash involving mucus membranes or skin necrosis: unknown Has patient had a PCN reaction that required hospitalization: unknown Has patient had a PCN reaction occurring within the last 10 years: unknown If all of the above answers are "NO", then may proceed with Cephalosporin use. 2      Medication List    STOP taking these medications   amLODipine 5 MG tablet Commonly known as:  NORVASC   metoprolol tartrate 25 MG tablet Commonly known as:  LOPRESSOR     TAKE these medications   aspirin EC 81 MG tablet Take 81 mg by mouth daily.   feeding supplement (ENSURE ENLIVE) Liqd Take 237 mLs by mouth 2 (two) times daily between meals.   fish oil-omega-3 fatty acids 1000 MG capsule Take 1 g by mouth daily.   GEMZAR IV Inject into the vein. Day 1,8,15 every 28 days   HYDROcodone-acetaminophen 5-325 MG tablet Commonly known as:  NORCO/VICODIN Take 1 tablet by mouth  every 6 (six) hours as needed for moderate pain.   levothyroxine 112 MCG tablet Commonly known as:  SYNTHROID, LEVOTHROID Take 1 tablet (112 mcg total) by mouth daily before breakfast.   lidocaine-prilocaine cream Commonly known as:  EMLA Apply a quarter size amount to affected area 1 hour prior to coming to chemotherapy.  Do not rub in.  Cover with plastic wrap.   meclizine 25 MG tablet Commonly known as:  ANTIVERT Take 25 mg by mouth 4 (four) times daily as needed. Dizziness   nitroGLYCERIN 0.4 MG SL tablet Commonly known as:  NITROSTAT Place 0.4 mg under the tongue every 5 (five) minutes x 3 doses as needed. For chest pain.   prochlorperazine 10 MG tablet Commonly known as:  COMPAZINE Take 1 tablet (10 mg total) by mouth every 6 (six) hours as needed for nausea or vomiting.   promethazine 25 MG tablet Commonly known as:  PHENERGAN Take 25 mg by mouth every 6 (six) hours as needed for nausea or vomiting.   torsemide 20 MG tablet Commonly known as:  DEMADEX Take 2 tablets (40 mg total) by mouth daily.   Vitamin D (Ergocalciferol) 50000 units Caps capsule Commonly known as:  DRISDOL Take 50,000 Units by mouth  2 (two) times a week. Tuesdays and [place]  (From admission, onward)        Start     Ordered   Unscheduled  DME 3-in-1  Once     02/03/17 1012   Unscheduled  DME tub bench  Once     02/03/17 1012     Allergies  Allergen Reactions  . Ciprofloxacin Other (See Comments)    Hallucination  . Nitrofurantoin Other (See Comments)    Dizziness: Resulting in a fall  . Lipitor [Atorvastatin] Other (See Comments)    Muscle Weakness  . Penicillins Rash    Has patient had a PCN reaction causing immediate rash, facial/tongue/throat swelling, SOB or lightheadedness with hypotension: unknown Has patient had a PCN reaction causing severe rash involving mucus membranes or skin necrosis: unknown Has patient had a PCN reaction that  required hospitalization: unknown Has patient had a PCN reaction occurring within the last 10 years: unknown If all of the above answers are "NO", then may proceed with Cephalosporin use. 2   Follow-up Information    [facility] Follow [facility].   Specialty:  [place] Why:  Will call you to set-up visits Contact information: [address] [place] [address] [phone]            The results of significant diagnostics from this hospitalization (including imaging, microbiology, ancillary and laboratory) are listed below for reference.    Significant Diagnostic Studies: Ct Abdomen Pelvis Wo Contrast  Result Date: 01/19/2017 CLINICAL DATA:  Metastatic right upper tract urothelial carcinoma. Dizziness. EXAM: CT ABDOMEN AND PELVIS WITHOUT CONTRAST TECHNIQUE: Multidetector CT imaging of the abdomen and pelvis was performed following the standard protocol without IV contrast. COMPARISON:  12/23/2016 CT chest, abdomen and pelvis. FINDINGS: Lower chest: Hypoventilatory changes at the dependent lung bases. Coronary atherosclerosis. Hepatobiliary: Finely irregular liver surface, compatible with cirrhosis. Subtle hypodense 1.9 x 1.2 cm segment 4B left liver lobe lesion (series 2/image 20), previously 1.4 x 0.9 cm, mildly increased. No additional liver lesions. Normal gallbladder with no radiopaque cholelithiasis. No biliary ductal dilatation. Pancreas: Normal, with no mass or duct dilation. Spleen: Normal size. No mass. Adrenals/Urinary Tract: Normal adrenals. Right ureteropelvic junction 4.6 x 3.7 cm mass (series 2/image 35), previously 4.3 x 3.6 cm, mildly increased. Stable asymmetric right renal atrophy with stable tumoral dilatation of the right renal collecting system. Right lumbar ureter 2.5 x 1.7 cm mass (series 2/image 43), previously 2.3 x 1.4 cm, mildly increased. No left hydronephrosis. No contour deforming left renal mass. No left renal stones. Bladder is  obscured by streak artifact from the right hip hardware. Tiny foci of gas in the nondependent bladder, presumably due to recent bladder instrumentation. No definite bladder wall thickening. Stomach/Bowel: Small hiatal hernia. Otherwise normal nondistended stomach. Normal caliber small bowel with no small bowel wall thickening. Normal appendix. Normal large bowel with no diverticulosis, large bowel wall thickening or pericolonic fat stranding. Vascular/Lymphatic: Atherosclerotic nonaneurysmal abdominal aorta. No pathologically enlarged lymph nodes in the abdomen or pelvis. Reproductive: Grossly normal uterus.  No adnexal mass. Other: No pneumoperitoneum, ascites or focal fluid collection. Musculoskeletal: No aggressive appearing focal osseous lesions. Right total hip arthroplasty. Stable moderate L2 vertebral compression fracture. Marked thoracolumbar spondylosis. IMPRESSION: 1. Dominant right UPJ tumor has increased in size. 2. Separate right lumbar ureter tumor has also increased in size. 3. Solitary segment 4B left liver lobe metastasis appears  mildly increased in size. 4. No new sites of metastatic disease in the abdomen or pelvis. 5. Chronic findings include: Aortic Atherosclerosis (ICD10-I70.0). Coronary atherosclerosis. Hepatic cirrhosis. Small hiatal hernia. Electronically Signed   By: Ilona Sorrel M.D.   On: 01/19/2017 21:47   Ct Head Wo Contrast  Result Date: 01/15/2017 CLINICAL DATA:  Nausea and dizziness for the past month. The dizziness is increasing. EXAM: CT HEAD WITHOUT CONTRAST TECHNIQUE: Contiguous axial images were obtained from the base of the skull through the vertex without intravenous contrast. COMPARISON:  01/04/2017. FINDINGS: Brain: Diffusely enlarged ventricles and subarachnoid spaces. Patchy white matter low density in both cerebral hemispheres. No intracranial hemorrhage, mass lesion or CT evidence of acute infarction. Vascular: No hyperdense vessel or unexpected calcification. Skull:  Mild bilateral hyperostosis frontalis. Sinuses/Orbits: Unremarkable. Other: None. IMPRESSION: 1. No acute abnormality. 2. Stable atrophy and chronic small vessel white matter ischemic changes. Electronically Signed   By: Claudie Revering M.D.   On: 01/15/2017 13:57   Ct Head Wo Contrast  Result Date: 01/04/2017 CLINICAL DATA:  C/O DIZZINESS TODAY, PATIENT IS CURRENTLY BEING TREATED FOR UTI WITH ANITBX, HX STAGE 4 UROTHELIAL CA WITH LIVER METS AND ONGOING CHEMO. HX DM, HTN, CKD EXAM: CT HEAD WITHOUT CONTRAST TECHNIQUE: Contiguous axial images were obtained from the base of the skull through the vertex without intravenous contrast. COMPARISON:  Head CT dated 04/27/2016. FINDINGS: Brain: Again noted is generalized age related parenchymal atrophy with commensurate dilatation of the ventricles and sulci. Chronic small vessel ischemic changes again noted within the bilateral periventricular and subcortical white matter. Small old lacunar infarct again noted within the right basal ganglia. There is no mass, hemorrhage, edema or other evidence of acute parenchymal abnormality. No extra-axial hemorrhage. Vascular: There are chronic calcified atherosclerotic changes of the large vessels at the skull base. No unexpected hyperdense vessel. Skull: Normal. Negative for fracture or focal lesion. Sinuses/Orbits: No acute finding. Other: None. IMPRESSION: 1. No acute findings. No intracranial mass, hemorrhage or edema. No evidence of intracranial metastasis. 2. Atrophy and chronic ischemic changes, as detailed above. Electronically Signed   By: Franki Cabot M.D.   On: 01/04/2017 13:53   Mr Brain Wo Contrast  Result Date: 01/29/2017 CLINICAL DATA:  Vertigo EXAM: MRI HEAD WITHOUT CONTRAST TECHNIQUE: Multiplanar, multiecho pulse sequences of the brain and surrounding structures were obtained without intravenous contrast. COMPARISON:  Head CT 01/15/2017 FINDINGS: Brain: The midline structures are normal. There is no acute infarct or  acute hemorrhage. No mass lesion, hydrocephalus, dural abnormality or extra-axial collection. There is multifocal white matter hyperintensity suggesting chronic ischemic microangiopathy. No age-advanced or lobar predominant atrophy. No chronic microhemorrhage or superficial siderosis. Vascular: Major intracranial arterial and venous sinus flow voids are preserved. Skull and upper cervical spine: The visualized skull base, calvarium, upper cervical spine and extracranial soft tissues are normal. Sinuses/Orbits: No fluid levels or advanced mucosal thickening. No mastoid or middle ear effusion. Normal orbits. IMPRESSION: Chronic ischemic microangiopathy without acute intracranial abnormality. Electronically Signed   By: Ulyses Jarred M.D.   On: 01/29/2017 03:58   US Venous Img Lower Unilateral Left  Result Date: 01/21/2017 CLINICAL DATA:  Left lower extremity pain and swelling. Shortness of breath. EXAM: LEFT LOWER EXTREMITY VENOUS DOPPLER ULTRASOUND TECHNIQUE: Gray-scale sonography with graded compression, as well as color Doppler and duplex ultrasound were performed to evaluate the lower extremity deep venous systems from the level of the common femoral vein and including the common femoral, femoral, profunda femoral, popliteal and calf veins  including the posterior tibial, peroneal and gastrocnemius veins when visible. The superficial great saphenous vein was also interrogated. Spectral Doppler was utilized to evaluate flow at rest and with distal augmentation maneuvers in the common femoral, femoral and popliteal veins. COMPARISON:  None. FINDINGS: Contralateral Common Femoral Vein: Respiratory phasicity is normal and symmetric with the symptomatic side. No evidence of thrombus. Normal compressibility. Common Femoral Vein: No evidence of thrombus. Normal compressibility, respiratory phasicity and response to augmentation. Saphenofemoral Junction: No evidence of thrombus. Normal compressibility and flow on color  Doppler imaging. Profunda Femoral Vein: No evidence of thrombus. Normal compressibility and flow on color Doppler imaging. Femoral Vein: No evidence of thrombus. Normal compressibility, respiratory phasicity and response to augmentation. Popliteal Vein: No evidence of thrombus. Normal compressibility, respiratory phasicity and response to augmentation. Calf Veins: No evidence of thrombus. Normal compressibility and flow on color Doppler imaging. Superficial Great Saphenous Vein: No evidence of thrombus. Normal compressibility. Venous Reflux:  None. Other Findings:  None. IMPRESSION: No evidence of deep venous thrombosis. Electronically Signed   By: Dorise Bullion III M.D   On: 01/21/2017 17:00   Ir US Guide Vasc Access Right  Result Date: 01/13/2017 INDICATION: 84 year old female with metastatic urothelial carcinoma. She requires durable IV access for chemotherapy. She presents for port catheter placement. EXAM: IMPLANTED PORT A CATH PLACEMENT WITH ULTRASOUND AND FLUOROSCOPIC GUIDANCE MEDICATIONS: 1 g vancomycin; The antibiotic was administered within an appropriate time interval prior to skin puncture. ANESTHESIA/SEDATION: Versed 1.5 mg IV; Fentanyl 75 mcg IV; Moderate Sedation Time:  15 minutes The patient was continuously monitored during the procedure by the interventional radiology nurse under my direct supervision. FLUOROSCOPY TIME:  0 minutes, 30 seconds (1 mGy) COMPLICATIONS: None immediate. PROCEDURE: The right neck and chest was prepped with chlorhexidine, and draped in the usual sterile fashion using maximum barrier technique (cap and mask, sterile gown, sterile gloves, large sterile sheet, hand hygiene and cutaneous antiseptic). Antibiotic prophylaxis was provided with 1g vancomycin administered IV one hour prior to skin incision. Local anesthesia was attained by infiltration with 1% lidocaine with epinephrine. Ultrasound demonstrated patency of the right internal jugular vein, and this was  documented with an image. Under real-time ultrasound guidance, this vein was accessed with a 21 gauge micropuncture needle and image documentation was performed. A small dermatotomy was made at the access site with an 11 scalpel. A 0.018" wire was advanced into the SVC and the access needle exchanged for a 51F micropuncture vascular sheath. The 0.018" wire was then removed and a 0.035" wire advanced into the IVC. An appropriate location for the subcutaneous reservoir was selected below the clavicle and an incision was made through the skin and underlying soft tissues. The subcutaneous tissues were then dissected using a combination of blunt and sharp surgical technique and a pocket was formed. A single lumen power injectable portacatheter was then tunneled through the subcutaneous tissues from the pocket to the dermatotomy and the port reservoir placed within the subcutaneous pocket. The venous access site was then serially dilated and a peel away vascular sheath placed over the wire. The wire was removed and the port catheter advanced into position under fluoroscopic guidance. The catheter tip is positioned in the upper right atrium. This was documented with a spot image. The portacatheter was then tested and found to flush and aspirate well. The port was flushed with saline followed by 100 units/mL heparinized saline. The pocket was then closed in two layers using first subdermal inverted interrupted absorbable sutures followed  by a running subcuticular suture. The epidermis was then sealed with Dermabond. The dermatotomy at the venous access site was also closed with a single inverted subdermal suture and the epidermis sealed with Dermabond. IMPRESSION: Successful placement of a right IJ approach Power Port with ultrasound and fluoroscopic guidance. The catheter is ready for use. Signed, Criselda Peaches, MD [facility] Electronically Signed   By: Jacqulynn Cadet M.D.   On: 01/13/2017 14:36   Dg Chest Port 1 View  Result Date: 01/28/2017 CLINICAL DATA:  Hyponatremia. EXAM: PORTABLE CHEST 1 VIEW COMPARISON:  01/21/2017 FINDINGS: The heart size and mediastinal contours are within normal limits. Aortic atherosclerosis. Both lungs are clear. Right-sided power port remains in appropriate position. IMPRESSION: No active disease. Electronically Signed   By: Earle Gell M.D.   On: 01/28/2017 23:24   Dg Chest Portable 1 View  Result Date: 01/21/2017 CLINICAL DATA:  Shortness of breath and wheezing EXAM: PORTABLE CHEST 1 VIEW COMPARISON:  04/27/2016 FINDINGS: Right IJ power port catheter tip lower SVC level. Marked cardiomegaly with mild central edema pattern, suspicious for volume overload or early CHF. Basilar atelectasis noted. No large effusion or pneumothorax. Upper lobes remain clear. Trachea is midline. Aorta is atherosclerotic. Bones are osteopenic. Degenerative changes of the spine and shoulders. IMPRESSION: Cardiomegaly with mild central edema as above. Basilar atelectasis Thoracic aortic atherosclerosis Electronically Signed   By: Jerilynn Mages.  Shick M.D.   On: 01/21/2017 13:57   Dg Hip Unilat With Pelvis 2-3 Views Right  Result Date: 01/08/2017 CLINICAL DATA:  Fall with right hip pain. EXAM: DG HIP (WITH OR WITHOUT PELVIS) 2-3V RIGHT COMPARISON:  Pelvis film from 04/29/2016. CT scout film from 09/06/2016. FINDINGS: Frontal pelvis shows diffuse bony demineralization. SI joints and symphysis pubis unremarkable. Degenerative changes noted in the left hip. AP and frog-leg lateral views of the right hip show the patient to be status post hemiarthroplasty. No evidence for periprosthetic fracture. Position of the femoral head component is similar to the CT scout image taking into account differential positioning. IMPRESSION: No acute bony abnormality. Electronically Signed   By: Misty Stanley M.D.   On: 01/08/2017 19:05   Ir Fluoro Guide Port Insertion  Right  Result Date: 01/13/2017 INDICATION: 84 year old female with metastatic urothelial carcinoma. She requires durable IV access for chemotherapy. She presents for port catheter placement. EXAM: IMPLANTED PORT A CATH PLACEMENT WITH ULTRASOUND AND FLUOROSCOPIC GUIDANCE MEDICATIONS: 1 g vancomycin; The antibiotic was administered within an appropriate time interval prior to skin puncture. ANESTHESIA/SEDATION: Versed 1.5 mg IV; Fentanyl 75 mcg IV; Moderate Sedation Time:  15 minutes The patient was continuously monitored during the procedure by the interventional radiology nurse under my direct supervision. FLUOROSCOPY TIME:  0 minutes, 30 seconds (1 mGy) COMPLICATIONS: None immediate. PROCEDURE: The right neck and chest was prepped with chlorhexidine, and draped in the usual sterile fashion using maximum barrier technique (cap and mask, sterile gown, sterile gloves, large sterile sheet, hand hygiene and cutaneous antiseptic). Antibiotic prophylaxis was provided with 1g vancomycin administered IV one hour prior to skin incision. Local anesthesia was attained by infiltration with 1% lidocaine with epinephrine. Ultrasound demonstrated patency of the right internal jugular vein, and this was documented with an image. Under real-time ultrasound guidance, this vein was accessed with a 21 gauge micropuncture needle and image documentation was performed. A small dermatotomy was made at the access site with an 11 scalpel. A 0.018" wire was advanced into the SVC and the access  needle exchanged for a 33F micropuncture vascular sheath. The 0.018" wire was then removed and a 0.035" wire advanced into the IVC. An appropriate location for the subcutaneous reservoir was selected below the clavicle and an incision was made through the skin and underlying soft tissues. The subcutaneous tissues were then dissected using a combination of blunt and sharp surgical technique and a pocket was formed. A single lumen power injectable  portacatheter was then tunneled through the subcutaneous tissues from the pocket to the dermatotomy and the port reservoir placed within the subcutaneous pocket. The venous access site was then serially dilated and a peel away vascular sheath placed over the wire. The wire was removed and the port catheter advanced into position under fluoroscopic guidance. The catheter tip is positioned in the upper right atrium. This was documented with a spot image. The portacatheter was then tested and found to flush and aspirate well. The port was flushed with saline followed by 100 units/mL heparinized saline. The pocket was then closed in two layers using first subdermal inverted interrupted absorbable sutures followed by a running subcuticular suture. The epidermis was then sealed with Dermabond. The dermatotomy at the venous access site was also closed with a single inverted subdermal suture and the epidermis sealed with Dermabond. IMPRESSION: Successful placement of a right IJ approach Power Port with ultrasound and fluoroscopic guidance. The catheter is ready for use. Signed, Criselda Peaches, MD [facility] Radiology Electronically Signed   By: Jacqulynn Cadet M.D.   On: 01/13/2017 14:36    Microbiology: No results found for this or any previous visit (from the past 240 hour(s)).   Labs: Basic Metabolic Panel: Recent Labs  Lab 01/30/17 0427 01/31/17 0435 02/01/17 0428 02/02/17 0249 02/03/17 0842  NA 127* 128* 124* 125* 129*  K 3.7 3.8 3.4* 3.3* 3.8  CL 87* 87* 85* 86* 93*  CO2 29 30 28 29 25   GLUCOSE 154* 131* 130* 121* 142*  BUN 20 19 17 18 19   CREATININE 2.13* 2.14* 1.99* 2.01* 2.12*  CALCIUM 8.1* 8.4* 8.3* 8.3* 8.5*  MG  --  1.6*  --   --   --   PHOS  --   --   --  3.8 3.1   Liver Function Tests: Recent Labs  Lab 01/28/17 1730 02/01/17 0428 02/02/17 0249 02/03/17 0842  AST 32 45*  --   --   ALT 12* 15  --   --   ALKPHOS 79 63  --    --   BILITOT 0.8 0.7  --   --   PROT 6.1* 5.2*  --   --   ALBUMIN 2.6* 2.4* 2.4* 2.5*   No results for input(s): LIPASE, AMYLASE in the last 168 hours. No results for input(s): AMMONIA in the last 168 hours. CBC: Recent Labs  Lab 01/28/17 1730 01/31/17 0435  WBC 6.4 4.4  NEUTROABS 3.8  --   HGB 11.8* 11.0*  HCT 35.2* 32.9*  MCV 93.1 94.5  PLT 214 265   Cardiac Enzymes: No results for input(s): CKTOTAL, CKMB, CKMBINDEX, TROPONINI in the last 168 hours. BNP: BNP (last 3 results) Recent Labs    01/21/17 1343 01/28/17 2350  BNP 209.0* 67.1    ProBNP (last 3 results) No results for input(s): PROBNP in the last 8760 hours.  CBG: Recent Labs  Lab 02/02/17 0805 02/02/17 1206 02/02/17 1630 02/02/17 2154 02/03/17 0756  GLUCAP 127* 188* 177* 212* 131*  Signed:  Nita Sells MD   [facility] 02/03/2017, 10:13 AM

## 2017-02-03 NOTE — Care Management Important Message (Signed)
Important Message  Patient Details  Name: Joann Hunter MRN: [id number] Date of Birth: Jan 07, 1932   Medicare Important Message Given:  Yes    [name] [place] 02/03/2017, 2:41 PM

## 2017-02-03 NOTE — Progress Notes (Signed)
[name] to be D/C'd Home per MD order.  Discussed prescriptions and follow up appointments with the patient. Prescriptions given to patient, medication list explained in detail. Pt verbalized understanding.  Allergies as of 02/03/2017      Reactions   Ciprofloxacin Other (See Comments)   Hallucination   Nitrofurantoin Other (See Comments)   Dizziness: Resulting in a fall   Lipitor [atorvastatin] Other (See Comments)   Muscle Weakness   Penicillins Rash   Has patient had a PCN reaction causing immediate rash, facial/tongue/throat swelling, SOB or lightheadedness with hypotension: unknown Has patient had a PCN reaction causing severe rash involving mucus membranes or skin necrosis: unknown Has patient had a PCN reaction that required hospitalization: unknown Has patient had a PCN reaction occurring within the last 10 years: unknown If all of the above answers are "NO", then may proceed with Cephalosporin use. 2      Medication List    STOP taking these medications   amLODipine 5 MG tablet Commonly known as:  NORVASC   metoprolol tartrate 25 MG tablet Commonly known as:  LOPRESSOR     TAKE these medications   aspirin EC 81 MG tablet Take 81 mg by mouth daily.   feeding supplement (ENSURE ENLIVE) Liqd Take 237 mLs by mouth 2 (two) times daily between meals.   fish oil-omega-3 fatty acids 1000 MG capsule Take 1 g by mouth daily.   GEMZAR IV Inject into the vein. Day 1,8,15 every 28 days   HYDROcodone-acetaminophen 5-325 MG tablet Commonly known as:  NORCO/VICODIN Take 1 tablet by mouth every 6 (six) hours as needed for moderate pain.   levothyroxine 112 MCG tablet Commonly known as:  SYNTHROID, LEVOTHROID Take 1 tablet (112 mcg total) by mouth daily before breakfast.   lidocaine-prilocaine cream Commonly known as:  EMLA Apply a quarter size amount to affected area 1 hour prior to coming to chemotherapy.  Do not rub in.  Cover with plastic wrap.   meclizine 25  MG tablet Commonly known as:  ANTIVERT Take 25 mg by mouth 4 (four) times daily as needed. Dizziness   nitroGLYCERIN 0.4 MG SL tablet Commonly known as:  NITROSTAT Place 0.4 mg under the tongue every 5 (five) minutes x 3 doses as needed. For chest pain.   prochlorperazine 10 MG tablet Commonly known as:  COMPAZINE Take 1 tablet (10 mg total) by mouth every 6 (six) hours as needed for nausea or vomiting.   promethazine 25 MG tablet Commonly known as:  PHENERGAN Take 25 mg by mouth every 6 (six) hours as needed for nausea or vomiting.   torsemide 20 MG tablet Commonly known as:  DEMADEX Take 2 tablets (40 mg total) by mouth daily.   Vitamin D (Ergocalciferol) 50000 units Caps capsule Commonly known as:  DRISDOL Take 50,000 Units by mouth 2 (two) times a week. Tuesdays and [place]  (From admission, onward)        Start     Ordered   02/03/17 1011  DME tub bench  Once     02/03/17 1012   02/03/17 1010  DME 3-in-1  Once     02/03/17 1012      Vitals:   02/03/17 0435 02/03/17 0753  BP: (!) 130/45 (!) 131/57  Pulse: 86 90  Resp: 19 18  Temp: 98 F (36.7 C) 98 F (36.7 C)  SpO2: 98% 97%    Skin  clean, dry and intact without evidence of skin break down, no evidence of skin tears noted. IV catheter discontinued intact. Site without signs and symptoms of complications. Dressing and pressure applied. Pt denies pain at this time. No complaints noted.  An After Visit Summary was printed and given to the patient. Patient escorted via [place], and D/C home via private auto.  Dixie Dials RN, BSN

## 2017-02-03 NOTE — Progress Notes (Signed)
Notified Butch Penny with [facility] Agency: [place] that patient has orders for discharge today.  Kristen Cardinal, BSN, RN Case [phone]

## 2017-02-05 ENCOUNTER — Other Ambulatory Visit: Payer: Self-pay

## 2017-02-05 ENCOUNTER — Emergency Department (HOSPITAL_COMMUNITY): Payer: Medicare Other

## 2017-02-05 ENCOUNTER — Encounter (HOSPITAL_COMMUNITY): Payer: Self-pay | Admitting: Emergency Medicine

## 2017-02-05 ENCOUNTER — Inpatient Hospital Stay (HOSPITAL_COMMUNITY)
Admission: EM | Admit: 2017-02-05 | Discharge: 2017-02-08 | DRG: 683 | Disposition: A | Discharge status: Skilled Nursing Facility | Payer: Medicare Other | Attending: Internal Medicine | Admitting: Internal Medicine

## 2017-02-05 DIAGNOSIS — G8929 Other chronic pain: Secondary | ICD-10-CM | POA: Diagnosis present

## 2017-02-05 DIAGNOSIS — C689 Malignant neoplasm of urinary organ, unspecified: Secondary | ICD-10-CM | POA: Diagnosis present

## 2017-02-05 DIAGNOSIS — N179 Acute kidney failure, unspecified: Secondary | ICD-10-CM | POA: Diagnosis not present

## 2017-02-05 DIAGNOSIS — E1122 Type 2 diabetes mellitus with diabetic chronic kidney disease: Secondary | ICD-10-CM | POA: Diagnosis present

## 2017-02-05 DIAGNOSIS — Z888 Allergy status to other drugs, medicaments and biological substances status: Secondary | ICD-10-CM

## 2017-02-05 DIAGNOSIS — I255 Ischemic cardiomyopathy: Secondary | ICD-10-CM | POA: Diagnosis present

## 2017-02-05 DIAGNOSIS — D631 Anemia in chronic kidney disease: Secondary | ICD-10-CM | POA: Diagnosis present

## 2017-02-05 DIAGNOSIS — Z96641 Presence of right artificial hip joint: Secondary | ICD-10-CM | POA: Diagnosis present

## 2017-02-05 DIAGNOSIS — E860 Dehydration: Secondary | ICD-10-CM | POA: Diagnosis present

## 2017-02-05 DIAGNOSIS — Z79899 Other long term (current) drug therapy: Secondary | ICD-10-CM

## 2017-02-05 DIAGNOSIS — H8110 Benign paroxysmal vertigo, unspecified ear: Secondary | ICD-10-CM | POA: Diagnosis present

## 2017-02-05 DIAGNOSIS — T451X5A Adverse effect of antineoplastic and immunosuppressive drugs, initial encounter: Secondary | ICD-10-CM | POA: Diagnosis present

## 2017-02-05 DIAGNOSIS — I2510 Atherosclerotic heart disease of native coronary artery without angina pectoris: Secondary | ICD-10-CM | POA: Diagnosis present

## 2017-02-05 DIAGNOSIS — R42 Dizziness and giddiness: Secondary | ICD-10-CM | POA: Diagnosis not present

## 2017-02-05 DIAGNOSIS — I130 Hypertensive heart and chronic kidney disease with heart failure and stage 1 through stage 4 chronic kidney disease, or unspecified chronic kidney disease: Secondary | ICD-10-CM | POA: Diagnosis present

## 2017-02-05 DIAGNOSIS — E871 Hypo-osmolality and hyponatremia: Secondary | ICD-10-CM | POA: Diagnosis present

## 2017-02-05 DIAGNOSIS — R402413 Glasgow coma scale score 13-15, at hospital admission: Secondary | ICD-10-CM | POA: Diagnosis present

## 2017-02-05 DIAGNOSIS — D638 Anemia in other chronic diseases classified elsewhere: Secondary | ICD-10-CM | POA: Diagnosis present

## 2017-02-05 DIAGNOSIS — M1990 Unspecified osteoarthritis, unspecified site: Secondary | ICD-10-CM | POA: Diagnosis present

## 2017-02-05 DIAGNOSIS — Z955 Presence of coronary angioplasty implant and graft: Secondary | ICD-10-CM

## 2017-02-05 DIAGNOSIS — E876 Hypokalemia: Secondary | ICD-10-CM | POA: Diagnosis present

## 2017-02-05 DIAGNOSIS — Z7982 Long term (current) use of aspirin: Secondary | ICD-10-CM

## 2017-02-05 DIAGNOSIS — N184 Chronic kidney disease, stage 4 (severe): Secondary | ICD-10-CM | POA: Diagnosis present

## 2017-02-05 DIAGNOSIS — M549 Dorsalgia, unspecified: Secondary | ICD-10-CM | POA: Diagnosis present

## 2017-02-05 DIAGNOSIS — I5032 Chronic diastolic (congestive) heart failure: Secondary | ICD-10-CM | POA: Diagnosis present

## 2017-02-05 DIAGNOSIS — Z959 Presence of cardiac and vascular implant and graft, unspecified: Secondary | ICD-10-CM

## 2017-02-05 DIAGNOSIS — I252 Old myocardial infarction: Secondary | ICD-10-CM

## 2017-02-05 DIAGNOSIS — E1121 Type 2 diabetes mellitus with diabetic nephropathy: Secondary | ICD-10-CM | POA: Diagnosis present

## 2017-02-05 DIAGNOSIS — I7389 Other specified peripheral vascular diseases: Secondary | ICD-10-CM | POA: Diagnosis present

## 2017-02-05 DIAGNOSIS — I951 Orthostatic hypotension: Secondary | ICD-10-CM | POA: Diagnosis present

## 2017-02-05 DIAGNOSIS — E039 Hypothyroidism, unspecified: Secondary | ICD-10-CM | POA: Diagnosis present

## 2017-02-05 DIAGNOSIS — Z880 Allergy status to penicillin: Secondary | ICD-10-CM

## 2017-02-05 DIAGNOSIS — Z881 Allergy status to other antibiotic agents status: Secondary | ICD-10-CM

## 2017-02-05 HISTORY — DX: Dizziness and giddiness: R42

## 2017-02-05 LAB — COMPREHENSIVE METABOLIC PANEL
ALT: 21 U/L (ref 14–54)
AST: 47 U/L — AB (ref 15–41)
Albumin: 2.7 g/dL — ABNORMAL LOW (ref 3.5–5.0)
Alkaline Phosphatase: 75 U/L (ref 38–126)
Anion gap: 10 (ref 5–15)
BUN: 22 mg/dL — AB (ref 6–20)
CO2: 24 mmol/L (ref 22–32)
CREATININE: 2.46 mg/dL — AB (ref 0.44–1.00)
Calcium: 8.9 mg/dL (ref 8.9–10.3)
Chloride: 98 mmol/L — ABNORMAL LOW (ref 101–111)
GFR calc Af Amer: 20 mL/min — ABNORMAL LOW (ref >60)
GFR calc non Af Amer: 17 mL/min — ABNORMAL LOW (ref >60)
Glucose, Bld: 220 mg/dL — ABNORMAL HIGH (ref 65–99)
POTASSIUM: 4 mmol/L (ref 3.5–5.1)
Sodium: 132 mmol/L — ABNORMAL LOW (ref 135–145)
Total Bilirubin: 0.6 mg/dL (ref 0.3–1.2)
Total Protein: 5.8 g/dL — ABNORMAL LOW (ref 6.5–8.1)

## 2017-02-05 LAB — URINALYSIS, ROUTINE W REFLEX MICROSCOPIC
Bilirubin Urine: NEGATIVE
Glucose, UA: NEGATIVE mg/dL
Hgb urine dipstick: NEGATIVE
KETONES UR: NEGATIVE mg/dL
LEUKOCYTES UA: NEGATIVE
NITRITE: NEGATIVE
PROTEIN: NEGATIVE mg/dL
Specific Gravity, Urine: 1.016 (ref 1.005–1.030)
pH: 6 (ref 5.0–8.0)

## 2017-02-05 LAB — CBC WITH DIFFERENTIAL/PLATELET
Basophils Absolute: 0.1 10*3/uL (ref 0.0–0.1)
Basophils Relative: 2 %
EOS ABS: 0.2 10*3/uL (ref 0.0–0.7)
Eosinophils Relative: 4 %
HEMATOCRIT: 33.5 % — AB (ref 36.0–46.0)
HEMOGLOBIN: 10.7 g/dL — AB (ref 12.0–15.0)
LYMPHS ABS: 1 10*3/uL (ref 0.7–4.0)
Lymphocytes Relative: 20 %
MCH: 31.9 pg (ref 26.0–34.0)
MCHC: 31.9 g/dL (ref 30.0–36.0)
MCV: 100 fL (ref 78.0–100.0)
Monocytes Absolute: 0.8 10*3/uL (ref 0.1–1.0)
Monocytes Relative: 16 %
Neutro Abs: 3 10*3/uL (ref 1.7–7.7)
Neutrophils Relative %: 58 %
Platelets: 301 10*3/uL (ref 150–400)
RBC: 3.35 MIL/uL — AB (ref 3.87–5.11)
RDW: 16.7 % — ABNORMAL HIGH (ref 11.5–15.5)
WBC: 5.1 10*3/uL (ref 4.0–10.5)

## 2017-02-05 LAB — LACTIC ACID, PLASMA
Lactic Acid, Venous: 1.4 mmol/L (ref 0.5–1.9)
Lactic Acid, Venous: 1.2 mmol/L (ref 0.5–1.9)

## 2017-02-05 LAB — GLUCOSE, CAPILLARY: Glucose-Capillary: 184 mg/dL — ABNORMAL HIGH (ref 65–99)

## 2017-02-05 LAB — TROPONIN I

## 2017-02-05 LAB — MAGNESIUM: MAGNESIUM: 2.1 mg/dL (ref 1.7–2.4)

## 2017-02-05 MED ORDER — MECLIZINE HCL 12.5 MG PO TABS: 25.0000 mg | ORAL_TABLET | Freq: Once | ORAL | Status: DC

## 2017-02-05 MED ORDER — MECLIZINE HCL 12.5 MG PO TABS
12.5000 mg | ORAL_TABLET | Freq: Once | ORAL | Status: AC
  Administered 2017-02-05: 12.5 mg via ORAL
  Filled 2017-02-05: qty 1

## 2017-02-05 MED ORDER — SODIUM CHLORIDE 0.9 % IV SOLN
INTRAVENOUS | Status: AC
  Administered 2017-02-05: 20:00:00 via INTRAVENOUS

## 2017-02-05 MED ORDER — HEPARIN SODIUM (PORCINE) 5000 UNIT/ML IJ SOLN
5000.0000 [IU] | Freq: Three times a day (TID) | INTRAMUSCULAR | Status: DC
  Administered 2017-02-05 – 2017-02-08 (×9): 5000 [IU] via SUBCUTANEOUS
  Filled 2017-02-05 (×9): qty 1

## 2017-02-05 MED ORDER — ASPIRIN EC 81 MG PO TBEC
81.0000 mg | DELAYED_RELEASE_TABLET | Freq: Every day | ORAL | Status: DC
  Administered 2017-02-06 – 2017-02-08 (×3): 81 mg via ORAL
  Filled 2017-02-05 (×3): qty 1

## 2017-02-05 MED ORDER — MECLIZINE HCL 12.5 MG PO TABS
25.0000 mg | ORAL_TABLET | Freq: Three times a day (TID) | ORAL | Status: DC
  Administered 2017-02-05 – 2017-02-08 (×9): 25 mg via ORAL
  Filled 2017-02-05 (×9): qty 2

## 2017-02-05 MED ORDER — ONDANSETRON HCL 4 MG PO TABS: 4.0000 mg | ORAL_TABLET | Freq: Four times a day (QID) | ORAL | Status: DC | PRN

## 2017-02-05 MED ORDER — INSULIN ASPART 100 UNIT/ML ~~LOC~~ SOLN
0.0000 [IU] | Freq: Three times a day (TID) | SUBCUTANEOUS | Status: DC
  Administered 2017-02-06: 1 [IU] via SUBCUTANEOUS
  Administered 2017-02-06: 2 [IU] via SUBCUTANEOUS
  Administered 2017-02-07: 3 [IU] via SUBCUTANEOUS
  Administered 2017-02-08 (×2): 1 [IU] via SUBCUTANEOUS

## 2017-02-05 MED ORDER — LEVOTHYROXINE SODIUM 112 MCG PO TABS
112.0000 ug | ORAL_TABLET | Freq: Every day | ORAL | Status: DC
  Administered 2017-02-06 – 2017-02-08 (×3): 112 ug via ORAL
  Filled 2017-02-05 (×3): qty 1

## 2017-02-05 MED ORDER — SODIUM CHLORIDE 0.9 % IV BOLUS (SEPSIS)
500.0000 mL | Freq: Once | INTRAVENOUS | Status: AC
  Administered 2017-02-05: 500 mL via INTRAVENOUS

## 2017-02-05 MED ORDER — ENSURE ENLIVE PO LIQD
237.0000 mL | Freq: Two times a day (BID) | ORAL | Status: DC
  Administered 2017-02-06 (×2): 237 mL via ORAL

## 2017-02-05 MED ORDER — ONDANSETRON HCL 4 MG/2ML IJ SOLN: 4.0000 mg | Freq: Four times a day (QID) | INTRAMUSCULAR | Status: DC | PRN

## 2017-02-05 NOTE — ED Provider Notes (Signed)
[facility] EMERGENCY DEPARTMENT Provider Note   CSN: [id number] Arrival date & time: 02/05/17  1237     History   Chief Complaint Chief Complaint  Patient presents with  . Weakness    HPI Joann Hunter is a 84 y.o. female.  HPI  Pt was seen at 1255. Per pt and her family, c/o gradual onset and persistence of constant "dizziness" since she was discharged from the hospital 2 days ago. Pt describes her symptoms as "everything is spinning" and "my vertigo." Pt has been taking antivert without improvement. Has been associated with generalized weakness. Pt has been seen by her Onc MD and ED several times, and admitted to the hospital 2 times in the past 1 month for similar complaints and dx with vertigo, UTI, hyponatremia, orthostatic hypotension, and AKI on chronic kidney disease. Pt denies CP/SOB, no abd pain, no N/V/D, no focal motor weakness, no tingling/numbness in extremities, no fevers.    Past Medical History:  Diagnosis Date  . Arthritis   . Back pain, chronic   . CAD (coronary artery disease)    NSTEMI 04/2011 with newly diagnosed 3V CAD s/p PCI/DES mLAD 04/11/11 with residual dz (per Dr. Burt Knack, should have consideration of PCI of the occluded RCA based on symptoms and/or consideration of outpatient nuclear perfusion testing after the patient recovers from her infarct)  . Cancer ([place])    kidney ( Nov.2017)  . Chronic kidney disease (CKD), stage IV (severe) ([place]) 12/13/2013  . Hyperglycemia   . Hyperlipidemia   . Hypertension   . Hypothyroidism   . Ischemic cardiomyopathy    EF 45% by cath 04/20/11, 50-55% by echo 04/22/11. hypotension limiting medication titration   . Myocardial infarction ([place]) 2013  . Type 2 diabetes with nephropathy ([place]) 07/26/2014  . UTI (urinary tract infection) 12/13/2013  . Vertigo     Patient Active Problem List   Diagnosis Date Noted  . AKI (acute kidney injury) ([place]) 01/29/2017  . Pressure injury of skin 01/23/2017  . Pulmonary edema  01/21/2017  . Pain and swelling of left lower leg 01/21/2017  . Hypokalemia 06/16/2016  . Hip fracture, unspecified laterality, closed, initial encounter ([place]) 04/28/2016  . Ischemic cardiomyopathy 04/28/2016  . Hyperlipidemia 04/28/2016  . Cancer ([place]) 04/28/2016  . Hip fracture ([place]) 04/28/2016  . Dehydration 04/14/2016  . Orthostasis 02/24/2016  . Acute encephalopathy 02/24/2016  . Confusion 02/21/2016  . Neutropenia ([place]) 02/20/2016  . Thrombocytopenia ([place]) 02/20/2016  . ESBL (extended spectrum beta-lactamase) producing bacteria infection 02/20/2016  . Goals of care, counseling/discussion 02/02/2016  . Urothelial cancer ([place]) 01/01/2016  . Rectal bleeding 11/05/2014  . Absolute anemia 11/05/2014  . Hypothyroidism 11/05/2014  . Metabolic encephalopathy 13/24/4010  . Type 2 diabetes with nephropathy ([place]) 07/26/2014  . Essential hypertension 07/26/2014  . Hyponatremia 07/25/2014  . Vertigo 07/25/2014  . Acute kidney injury superimposed on chronic kidney disease ([place]) 12/13/2013  . Fall 12/13/2013  . Lower urinary tract infectious disease 12/13/2013  . Chronic kidney disease (CKD), stage IV (severe) ([place]) 12/13/2013  . Chest pain at rest 04/26/2011  . Syncope, near 04/26/2011  . CAD (coronary artery disease) 04/23/2011  . Cardiomyopathy, ischemic 04/23/2011  . Dyslipidemia 04/23/2011  . Myocardial infarction, anterior wall, initial care ([place]) 04/20/2011    Past Surgical History:  Procedure Laterality Date  . BACK SURGERY    . BREAST LUMPECTOMY     right  . CORONARY STENT PLACEMENT    . HIP ARTHROPLASTY Right 04/29/2016   Procedure: ARTHROPLASTY  BIPOLAR HIP (HEMIARTHROPLASTY);  Surgeon: Carole Civil, MD;  Location: [facility];  Service: Orthopedics;  Laterality: Right;  . IR FLUORO GUIDE PORT INSERTION RIGHT  01/13/2017  . IR US GUIDE VASC ACCESS RIGHT  01/13/2017  . LEFT HEART CATHETERIZATION WITH CORONARY ANGIOGRAM N/A 04/20/2011   Procedure: LEFT HEART  CATHETERIZATION WITH CORONARY ANGIOGRAM;  Surgeon: Burnell Blanks, MD;  Location: [facility] CATH LAB;  Service: Cardiovascular;  Laterality: N/A;  . PERCUTANEOUS CORONARY STENT INTERVENTION (PCI-S) N/A 04/21/2011   Procedure: PERCUTANEOUS CORONARY STENT INTERVENTION (PCI-S);  Surgeon: Sherren Mocha, MD;  Location: [facility] CATH LAB;  Service: Cardiovascular;  Laterality: N/A;    OB History    Gravida Para Term Preterm AB Living   2 2 2     2    SAB TAB Ectopic Multiple Live Births                   Home Medications    Prior to Admission medications   Medication Sig Start Date End Date Taking? Authorizing Provider  aspirin EC 81 MG tablet Take 81 mg by mouth daily.    [provider]  feeding supplement, ENSURE ENLIVE, (ENSURE ENLIVE) LIQD Take 237 mLs by mouth 2 (two) times daily between meals. 05/02/16   Elgergawy, Silver Huguenin, MD  fish oil-omega-3 fatty acids 1000 MG capsule Take 1 g by mouth daily.    [provider]  Gemcitabine HCl (GEMZAR IV) Inject into the vein. Day 1,8,15 every 28 days    [provider]  HYDROcodone-acetaminophen (NORCO/VICODIN) 5-325 MG tablet Take 1 tablet by mouth every 6 (six) hours as needed for moderate pain. 06/03/16   Carole Civil, MD  levothyroxine (SYNTHROID, LEVOTHROID) 112 MCG tablet Take 1 tablet (112 mcg total) by mouth daily before breakfast. 09/08/16   Holley Bouche, NP  lidocaine-prilocaine (EMLA) cream Apply a quarter size amount to affected area 1 hour prior to coming to chemotherapy.  Do not rub in.  Cover with plastic wrap. 01/09/17   Jacquelin Hawking, NP  meclizine (ANTIVERT) 25 MG tablet Take 25 mg by mouth 4 (four) times daily as needed. Dizziness    [provider]  nitroGLYCERIN (NITROSTAT) 0.4 MG SL tablet Place 0.4 mg under the tongue every 5 (five) minutes x 3 doses as needed. For chest pain. 04/23/11   Dunn, Nedra Hai, PA-C  prochlorperazine (COMPAZINE) 10 MG tablet Take 1 tablet (10 mg total) by mouth  every 6 (six) hours as needed for nausea or vomiting. 01/29/16   [place], Kelby Fam, MD  promethazine (PHENERGAN) 25 MG tablet Take 25 mg by mouth every 6 (six) hours as needed for nausea or vomiting.    [provider]  torsemide (DEMADEX) 20 MG tablet Take 2 tablets (40 mg total) by mouth daily. 02/03/17   Nita Sells, MD  Vitamin D, Ergocalciferol, (DRISDOL) 50000 UNITS CAPS Take 50,000 Units by mouth 2 (two) times a week. Tuesdays and Thurdays 05/30/12   [provider]    Family History Family History  Problem Relation Age of Onset  . Other Mother   . Cancer Father   . Other Unknown        no known family cardiac disease  . Other Brother        Diptheria  . Depression Sister   . Pneumonia Sister   . Other Brother        murdered    Social History Social History   Tobacco Use  .  Smoking status: Never Smoker  . Smokeless tobacco: Never Used  Substance Use Topics  . Alcohol use: No  . Drug use: No     Allergies   Ciprofloxacin; Nitrofurantoin; Lipitor [atorvastatin]; and Penicillins   Review of Systems Review of Systems ROS: Statement: All systems negative except as marked or noted in the HPI; Constitutional: Negative for fever and chills. ; ; Eyes: Negative for eye pain, redness and discharge. ; ; ENMT: Negative for ear pain, hoarseness, nasal congestion, sinus pressure and sore throat. ; ; Cardiovascular: Negative for chest pain, palpitations, diaphoresis, dyspnea and peripheral edema. ; ; Respiratory: Negative for cough, wheezing and stridor. ; ; Gastrointestinal: Negative for nausea, vomiting, diarrhea, abdominal pain, blood in stool, hematemesis, jaundice and rectal bleeding. . ; ; Genitourinary: Negative for dysuria, flank pain and hematuria. ; ; Musculoskeletal: Negative for back pain and neck pain. Negative for swelling and trauma.; ; Skin: Negative for pruritus, rash, abrasions, blisters, bruising and skin lesion.; ; Neuro: +vertigo,  generalized weakness. Negative for headache, lightheadedness and neck stiffness. Negative for altered level of consciousness, altered mental status, extremity weakness, paresthesias, involuntary movement, seizure and syncope.      Physical Exam Updated Vital Signs BP (S) (!) 112/52 (BP Location: Left Arm)   Pulse 75   Temp 98.4 F (36.9 C) (Oral)   Resp 18   Ht 5\' 2"  (1.575 m)   Wt 75.8 kg (167 lb)   SpO2 94%   BMI 30.54 kg/m   Physical Exam 1300: Physical examination:  Nursing notes reviewed; Vital signs and O2 SAT reviewed;  Constitutional: Well developed, Well nourished, In no acute distress; Head:  Normocephalic, atraumatic; Eyes: EOMI, PERRL, No scleral icterus; ENMT: Mouth and pharynx normal, Mucous membranes dry; Neck: Supple, Full range of motion, No lymphadenopathy; Cardiovascular: Regular rate and rhythm, No gallop; Respiratory: Breath sounds clear & equal bilaterally, No wheezes.  Speaking full sentences with ease, Normal respiratory effort/excursion; Chest: Nontender, Movement normal; Abdomen: Soft, Nontender, Nondistended, Normal bowel sounds; Genitourinary: No CVA tenderness; Extremities: Pulses normal, No tenderness, No edema, No calf edema or asymmetry.; Neuro: AA&Ox3, Major CN grossly intact. No facial droop. Speech clear. No gross focal motor or sensory deficits in extremities.; Skin: Color normal, Warm, Dry.   ED Treatments / Results  Labs (all labs ordered are listed, but only abnormal results are displayed)   EKG  EKG Interpretation  Date/Time:  Sunday February 05 2017 12:56:57 EST Ventricular Rate:  71 PR Interval:    QRS Duration: 90 QT Interval:  407 QTC Calculation: 443 R Axis:   11 Text Interpretation:  Sinus rhythm Borderline prolonged PR interval Probable left atrial enlargement Low voltage, precordial leads When compared with ECG of 01/28/2017 No significant change was found Confirmed by Francine Graven [phone]) on 02/05/2017 1:07:33 PM       EKG  Interpretation  Date/Time:  Sunday February 05 2017 13:55:22 EST Ventricular Rate:  93 PR Interval:    QRS Duration: 72 QT Interval:  411 QTC Calculation: 360 R Axis:   29 Text Interpretation:  Sinus rhythm Ventricular bigeminy Prolonged PR interval Probable left atrial enlargement Low voltage, precordial leads Minimal ST depression, lateral leads Artifact Since last tracing of earlier today ventricular bigeminy is now present Confirmed by Francine Graven [phone]) on 02/05/2017 2:12:05 PM          Radiology   Procedures Procedures (including critical care time)  Medications Ordered in ED Medications - No data to display   Initial Impression / Assessment  and Plan / ED Course  I have reviewed the triage vital signs and the nursing notes.  Pertinent labs & imaging results that were available during my care of the patient were reviewed by me and considered in my medical decision making (see chart for details).  MDM Reviewed: previous chart, nursing note and vitals Reviewed previous: labs, ECG and MRI Interpretation: labs, ECG, x-ray and CT scan   Results for orders placed or performed during the hospital encounter of 02/05/17  Urinalysis, Routine w reflex microscopic  Result Value Ref Range   Color, Urine YELLOW YELLOW   APPearance CLEAR CLEAR   Specific Gravity, Urine 1.016 1.005 - 1.030   pH 6.0 5.0 - 8.0   Glucose, UA NEGATIVE NEGATIVE mg/dL   Hgb urine dipstick NEGATIVE NEGATIVE   Bilirubin Urine NEGATIVE NEGATIVE   Ketones, ur NEGATIVE NEGATIVE mg/dL   Protein, ur NEGATIVE NEGATIVE mg/dL   Nitrite NEGATIVE NEGATIVE   Leukocytes, UA NEGATIVE NEGATIVE  Comprehensive metabolic panel  Result Value Ref Range   Sodium 132 (L) 135 - 145 mmol/L   Potassium 4.0 3.5 - 5.1 mmol/L   Chloride 98 (L) 101 - 111 mmol/L   CO2 24 22 - 32 mmol/L   Glucose, Bld 220 (H) 65 - 99 mg/dL   BUN 22 (H) 6 - 20 mg/dL   Creatinine, Ser 2.46 (H) 0.44 - 1.00 mg/dL   Calcium 8.9 8.9 -  10.3 mg/dL   Total Protein 5.8 (L) 6.5 - 8.1 g/dL   Albumin 2.7 (L) 3.5 - 5.0 g/dL   AST 47 (H) 15 - 41 U/L   ALT 21 14 - 54 U/L   Alkaline Phosphatase 75 38 - 126 U/L   Total Bilirubin 0.6 0.3 - 1.2 mg/dL   GFR calc non Af Amer 17 (L) >60 mL/min   GFR calc Af Amer 20 (L) >60 mL/min   Anion gap 10 5 - 15  Troponin I  Result Value Ref Range   Troponin I <0.03 <0.03 ng/mL  Lactic acid, plasma  Result Value Ref Range   Lactic Acid, Venous 1.4 0.5 - 1.9 mmol/L  Lactic acid, plasma  Result Value Ref Range   Lactic Acid, Venous 1.2 0.5 - 1.9 mmol/L  CBC with Differential  Result Value Ref Range   WBC 5.1 4.0 - 10.5 K/uL   RBC 3.35 (L) 3.87 - 5.11 MIL/uL   Hemoglobin 10.7 (L) 12.0 - 15.0 g/dL   HCT 33.5 (L) 36.0 - 46.0 %   MCV 100.0 78.0 - 100.0 fL   MCH 31.9 26.0 - 34.0 pg   MCHC 31.9 30.0 - 36.0 g/dL   RDW 16.7 (H) 11.5 - 15.5 %   Platelets 301 150 - 400 K/uL   Neutrophils Relative % 58 %   Neutro Abs 3.0 1.7 - 7.7 K/uL   Lymphocytes Relative 20 %   Lymphs Abs 1.0 0.7 - 4.0 K/uL   Monocytes Relative 16 %   Monocytes Absolute 0.8 0.1 - 1.0 K/uL   Eosinophils Relative 4 %   Eosinophils Absolute 0.2 0.0 - 0.7 K/uL   Basophils Relative 2 %   Basophils Absolute 0.1 0.0 - 0.1 K/uL  Magnesium  Result Value Ref Range   Magnesium 2.1 1.7 - 2.4 mg/dL     Dg Chest 2 View Result Date: 02/05/2017 CLINICAL DATA:  Weakness, vertigo, chemotherapy last month EXAM: CHEST  2 VIEW COMPARISON:  None. FINDINGS: There is a right-sided Port-A-Cath with the tip projecting over the SVC. There  is no focal parenchymal opacity. There is no pleural effusion or pneumothorax. The heart and mediastinal contours are unremarkable. There is thoracic aortic atherosclerosis. The osseous structures are unremarkable. IMPRESSION: No active cardiopulmonary disease. Electronically Signed   By: Kathreen Devoid   On: 02/05/2017 13:55   Ct Head Wo Contrast Result Date: 02/05/2017 CLINICAL DATA:  Vertigo, generalize  weakness and dizziness EXAM: CT HEAD WITHOUT CONTRAST TECHNIQUE: Contiguous axial images were obtained from the base of the skull through the vertex without intravenous contrast. COMPARISON:  MR brain 01/29/2017 FINDINGS: Brain: No evidence of acute infarction, hemorrhage, extra-axial collection, ventriculomegaly, or mass effect. Generalized cerebral atrophy. Periventricular white matter low attenuation likely secondary to microangiopathy. Vascular: Cerebrovascular atherosclerotic calcifications are noted. Skull: Negative for fracture or focal lesion. Sinuses/Orbits: Visualized portions of the orbits are unremarkable. Visualized portions of the paranasal sinuses and mastoid air cells are unremarkable. Other: None. IMPRESSION: 1. No acute intracranial pathology. Electronically Signed   By: Kathreen Devoid   On: 02/05/2017 13:43    Mr Brain Wo Contrast Result Date: 01/29/2017 CLINICAL DATA:  Vertigo EXAM: MRI HEAD WITHOUT CONTRAST TECHNIQUE: Multiplanar, multiecho pulse sequences of the brain and surrounding structures were obtained without intravenous contrast. COMPARISON:  Head CT 01/15/2017 FINDINGS: Brain: The midline structures are normal. There is no acute infarct or acute hemorrhage. No mass lesion, hydrocephalus, dural abnormality or extra-axial collection. There is multifocal white matter hyperintensity suggesting chronic ischemic microangiopathy. No age-advanced or lobar predominant atrophy. No chronic microhemorrhage or superficial siderosis. Vascular: Major intracranial arterial and venous sinus flow voids are preserved. Skull and upper cervical spine: The visualized skull base, calvarium, upper cervical spine and extracranial soft tissues are normal. Sinuses/Orbits: No fluid levels or advanced mucosal thickening. No mastoid or middle ear effusion. Normal orbits. IMPRESSION: Chronic ischemic microangiopathy without acute intracranial abnormality. Electronically Signed   By: Ulyses Jarred M.D.   On:  01/29/2017 03:58    1550:  Orthostatic on VS and BUN/Cr elevated from baseline. Judicious IVF given. Pt attempted to ambulate; unable to stand or ambulate. Question need for placement.  T/C to [facility] Dr. Denton Brick, case discussed, including:  HPI, pertinent PM/SHx, VS/PE, dx testing, ED course and treatment:  Agreeable to admit.     Final Clinical Impressions(s) / ED Diagnoses   Final diagnoses:  None    ED Discharge Orders    None       Francine Graven, DO 02/07/17 4742

## 2017-02-05 NOTE — ED Notes (Signed)
Stood patient to attempt to ambulate for approx. 5 seconds, then patient stated she needed to lay down she was very dizzy. Patient was unable to ambulate.

## 2017-02-05 NOTE — H&P (Signed)
History and Physical    Joann Hunter QQV:[id number] DOB: 11-Jun-1932 DOA: 02/05/2017  PCP: Monico Blitz, MD   Patient coming from: Home  Chief Complaint: Dizziness  HPI: Joann Hunter is a 84 y.o. female with medical history significant for CKD4, DM2, CAD, HTN, hypothyroidism, G1DD, metastatic urothelial cancer.    Patient with 2 recent hospital admissions 1/19- 1/23, managed for pulmonary edema, 2/2 dCHF and most recent 01/28/17- 02/03/17-admission for  vertigo thought to be due to BPPV, also acute on chronic CKD 4, to be cardiorenal.  Admission workup included MRI and echo which were unremarkable.   Patient presented today with same complaints of vertigo, which patient reports has been persistent since discharge.  Per daughter patient cannot function, due to dizziness, meclizine taking as prescribed has not helped.  Symptoms are worse when standing and present but minimal when lying down.  At baseline patient ambulates with a walker.  Apparently per patient and her daughter they declined nursing home discharge last admission.  They were sent home with home health PT but first PT visit will be tomorrow.  Patient endorses poor p.o. intake over the past few days.  No Vomiting or diarrhea , no abdominal pain, no chest pain or loss of consciousness, no falls no shortness of breath or cough.  ED Course: Stable vitals.  Creatinine mildly elevated at 2.46 from 2.1.  Troponin less than 0.03.  Lactic acid 1.4.  Chest x-ray head CT negative for acute abnormality.  Patient was mildly orthostatic with systolic blood pressure dropping from 127 to 108. Pt was given 500 ml bolus in the ED and meclizine. Patient still could not ambulate hence hospitalist was called to admit.  Review of Systems: As per HPI otherwise 10 point review of systems negative.   Past Medical History:  Diagnosis Date  . Arthritis   . Back pain, chronic   . CAD (coronary artery disease)    NSTEMI 04/2011 with newly diagnosed 3V CAD  s/p PCI/DES mLAD 04/11/11 with residual dz (per Dr. Burt Knack, should have consideration of PCI of the occluded RCA based on symptoms and/or consideration of outpatient nuclear perfusion testing after the patient recovers from her infarct)  . Cancer ([place])    kidney ( Nov.2017)  . Chronic kidney disease (CKD), stage IV (severe) ([place]) 12/13/2013  . Hyperglycemia   . Hyperlipidemia   . Hypertension   . Hypothyroidism   . Ischemic cardiomyopathy    EF 45% by cath 04/20/11, 50-55% by echo 04/22/11. hypotension limiting medication titration   . Myocardial infarction ([place]) 2013  . Type 2 diabetes with nephropathy ([place]) 07/26/2014  . UTI (urinary tract infection) 12/13/2013  . Vertigo     Past Surgical History:  Procedure Laterality Date  . BACK SURGERY    . BREAST LUMPECTOMY     right  . CORONARY STENT PLACEMENT    . HIP ARTHROPLASTY Right 04/29/2016   Procedure: ARTHROPLASTY BIPOLAR HIP (HEMIARTHROPLASTY);  Surgeon: Carole Civil, MD;  Location: [facility];  Service: Orthopedics;  Laterality: Right;  . IR FLUORO GUIDE PORT INSERTION RIGHT  01/13/2017  . IR US GUIDE VASC ACCESS RIGHT  01/13/2017  . LEFT HEART CATHETERIZATION WITH CORONARY ANGIOGRAM N/A 04/20/2011   Procedure: LEFT HEART CATHETERIZATION WITH CORONARY ANGIOGRAM;  Surgeon: Burnell Blanks, MD;  Location: [facility] CATH LAB;  Service: Cardiovascular;  Laterality: N/A;  . PERCUTANEOUS CORONARY STENT INTERVENTION (PCI-S) N/A 04/21/2011   Procedure: PERCUTANEOUS CORONARY STENT INTERVENTION (PCI-S);  Surgeon: Sherren Mocha, MD;  Location:  [facility] CATH LAB;  Service: Cardiovascular;  Laterality: N/A;     reports that  has never smoked. she has never used smokeless tobacco. She reports that she does not drink alcohol or use drugs.  Allergies  Allergen Reactions  . Ciprofloxacin Other (See Comments)    Hallucination  . Nitrofurantoin Other (See Comments)    Dizziness: Resulting in a fall  . Lipitor [Atorvastatin] Other (See Comments)     Muscle Weakness  . Penicillins Rash    Has patient had a PCN reaction causing immediate rash, facial/tongue/throat swelling, SOB or lightheadedness with hypotension: unknown Has patient had a PCN reaction causing severe rash involving mucus membranes or skin necrosis: unknown Has patient had a PCN reaction that required hospitalization: unknown Has patient had a PCN reaction occurring within the last 10 years: unknown If all of the above answers are "NO", then may proceed with Cephalosporin use. 2    Family History  Problem Relation Age of Onset  . Other Mother   . Cancer Father   . Other Unknown        no known family cardiac disease  . Other Brother        Diptheria  . Depression Sister   . Pneumonia Sister   . Other Brother        murdered   Prior to Admission medications   Medication Sig Start Date End Date Taking? Authorizing Provider  aspirin EC 81 MG tablet Take 81 mg by mouth daily.   Yes [provider]  feeding supplement, ENSURE ENLIVE, (ENSURE ENLIVE) LIQD Take 237 mLs by mouth 2 (two) times daily between meals. 05/02/16  Yes Elgergawy, Silver Huguenin, MD  fish oil-omega-3 fatty acids 1000 MG capsule Take 1 g by mouth daily.   Yes [provider]  Gemcitabine HCl (GEMZAR IV) Inject into the vein. Day 1,8,15 every 28 days   Yes [provider]  HYDROcodone-acetaminophen (NORCO/VICODIN) 5-325 MG tablet Take 1 tablet by mouth every 6 (six) hours as needed for moderate pain. 06/03/16  Yes Carole Civil, MD  levothyroxine (SYNTHROID, LEVOTHROID) 112 MCG tablet Take 1 tablet (112 mcg total) by mouth daily before breakfast. 09/08/16  Yes Holley Bouche, NP  lidocaine-prilocaine (EMLA) cream Apply a quarter size amount to affected area 1 hour prior to coming to chemotherapy.  Do not rub in.  Cover with plastic wrap. 01/09/17  Yes Jacquelin Hawking, NP  meclizine (ANTIVERT) 25 MG tablet Take 25 mg by mouth 4 (four) times daily as needed. Dizziness   Yes  [provider]  nitroGLYCERIN (NITROSTAT) 0.4 MG SL tablet Place 0.4 mg under the tongue every 5 (five) minutes x 3 doses as needed. For chest pain. 04/23/11  Yes Dunn, Dayna N, PA-C  prochlorperazine (COMPAZINE) 10 MG tablet Take 1 tablet (10 mg total) by mouth every 6 (six) hours as needed for nausea or vomiting. 01/29/16  Yes Penland, Kelby Fam, MD  promethazine (PHENERGAN) 25 MG tablet Take 25 mg by mouth every 6 (six) hours as needed for nausea or vomiting.   Yes [provider]  torsemide (DEMADEX) 20 MG tablet Take 2 tablets (40 mg total) by mouth daily. 02/03/17  Yes Nita Sells, MD  Vitamin D, Ergocalciferol, (DRISDOL) 50000 UNITS CAPS Take 50,000 Units by mouth 2 (two) times a week. Tuesdays and Thurdays 05/30/12   [provider]    Physical Exam: Vitals:   02/05/17 1500 02/05/17 1600 02/05/17 1700 02/05/17 1730  BP: (!) 143/58 124/61 Marland Kitchen)  117/47 (!) 132/57  Pulse: 70 66 69 64  Resp: 18 14 18 18   Temp:      TempSrc:      SpO2: 100% 99% 95% 96%  Weight:      Height:        Constitutional: NAD, calm, comfortable Vitals:   02/05/17 1500 02/05/17 1600 02/05/17 1700 02/05/17 1730  BP: (!) 143/58 124/61 (!) 117/47 (!) 132/57  Pulse: 70 66 69 64  Resp: 18 14 18 18   Temp:      TempSrc:      SpO2: 100% 99% 95% 96%  Weight:      Height:       Eyes: PERRL, lids and conjunctivae normal ENMT: Mucous membranes are dry. Posterior pharynx clear of any exudate or lesions.Normal dentition.  Neck: normal, supple, no masses, no thyromegaly Respiratory: clear to auscultation bilaterally, no wheezing, no crackles. Normal respiratory effort. No accessory muscle use.  Cardiovascular: Regular rate and rhythm, no murmurs / rubs / gallops. No extremity edema. 2+ pedal pulses. No carotid bruits.  Abdomen: no tenderness, no masses palpated. No hepatosplenomegaly. Bowel sounds positive.  Musculoskeletal: no clubbing / cyanosis. No joint deformity upper and lower  extremities. Good ROM, no contractures. Normal muscle tone.  Skin: no rashes, lesions, ulcers. No induration Neurologic: CN 2-12 grossly intact. Sensation intact, Strength 5/5 in all 4.  Psychiatric: Normal judgment and insight. Alert and oriented x 3. Normal mood.   Labs on Admission: I have personally reviewed following labs and imaging studies  CBC: Recent Labs  Lab 01/31/17 0435 02/05/17 1340  WBC 4.4 5.1  NEUTROABS  --  3.0  HGB 11.0* 10.7*  HCT 32.9* 33.5*  MCV 94.5 100.0  PLT 265 423   Basic Metabolic Panel: Recent Labs  Lab 01/31/17 0435 02/01/17 0428 02/02/17 0249 02/03/17 0842 02/05/17 1340 02/05/17 1354  NA 128* 124* 125* 129* 132*  --   K 3.8 3.4* 3.3* 3.8 4.0  --   CL 87* 85* 86* 93* 98*  --   CO2 30 28 29 25 24   --   GLUCOSE 131* 130* 121* 142* 220*  --   BUN 19 17 18 19  22*  --   CREATININE 2.14* 1.99* 2.01* 2.12* 2.46*  --   CALCIUM 8.4* 8.3* 8.3* 8.5* 8.9  --   MG 1.6*  --   --   --   --  2.1  PHOS  --   --  3.8 3.1  --   --    Liver Function Tests: Recent Labs  Lab 02/01/17 0428 02/02/17 0249 02/03/17 0842 02/05/17 1340  AST 45*  --   --  47*  ALT 15  --   --  21  ALKPHOS 63  --   --  75  BILITOT 0.7  --   --  0.6  PROT 5.2*  --   --  5.8*  ALBUMIN 2.4* 2.4* 2.5* 2.7*   Cardiac Enzymes: Recent Labs  Lab 02/05/17 1340  TROPONINI <0.03   CBG: Recent Labs  Lab 02/02/17 0805 02/02/17 1206 02/02/17 1630 02/02/17 2154 02/03/17 0756  GLUCAP 127* 188* 177* 212* 131*   Urine analysis:    Component Value Date/Time   COLORURINE YELLOW 02/05/2017 1305   APPEARANCEUR CLEAR 02/05/2017 1305   LABSPEC 1.016 02/05/2017 1305   PHURINE 6.0 02/05/2017 [place] 02/05/2017 1305   HGBUR NEGATIVE 02/05/2017 1305   BILIRUBINUR NEGATIVE 02/05/2017 1305   KETONESUR NEGATIVE 02/05/2017 1305  PROTEINUR NEGATIVE 02/05/2017 1305   UROBILINOGEN 0.2 11/05/2014 2000   NITRITE NEGATIVE 02/05/2017 1305   LEUKOCYTESUR NEGATIVE  02/05/2017 1305    Radiological Exams on Admission: Dg Chest 2 View  Result Date: 02/05/2017 CLINICAL DATA:  Weakness, vertigo, chemotherapy last month EXAM: CHEST  2 VIEW COMPARISON:  None. FINDINGS: There is a right-sided Port-A-Cath with the tip projecting over the SVC. There is no focal parenchymal opacity. There is no pleural effusion or pneumothorax. The heart and mediastinal contours are unremarkable. There is thoracic aortic atherosclerosis. The osseous structures are unremarkable. IMPRESSION: No active cardiopulmonary disease. Electronically Signed   By: Kathreen Devoid   On: 02/05/2017 13:55   Ct Head Wo Contrast  Result Date: 02/05/2017 CLINICAL DATA:  Vertigo, generalize weakness and dizziness EXAM: CT HEAD WITHOUT CONTRAST TECHNIQUE: Contiguous axial images were obtained from the base of the skull through the vertex without intravenous contrast. COMPARISON:  MR brain 01/29/2017 FINDINGS: Brain: No evidence of acute infarction, hemorrhage, extra-axial collection, ventriculomegaly, or mass effect. Generalized cerebral atrophy. Periventricular white matter low attenuation likely secondary to microangiopathy. Vascular: Cerebrovascular atherosclerotic calcifications are noted. Skull: Negative for fracture or focal lesion. Sinuses/Orbits: Visualized portions of the orbits are unremarkable. Visualized portions of the paranasal sinuses and mastoid air cells are unremarkable. Other: None. IMPRESSION: 1. No acute intracranial pathology. Electronically Signed   By: Kathreen Devoid   On: 02/05/2017 13:43   EKG: Independently reviewed.  Frequent PVCs otherwise no significant change from prior.  Assessment/Plan Principal Problem:   Vertigo Active Problems:   Chronic kidney disease (CKD), stage IV (severe) (HCC)   Type 2 diabetes with nephropathy (HCC)   Urothelial cancer (HCC)  Vertigo-likely BPPV, possible mild dehydration contributing. MRI 01/29/17-chronic ischemic microangiopathy without acute  intracranial abnormality.  Recent echo 01/22/17- no valvular abnormality, EF 55-60%.  Mild orthostasis in the ED. troponin < 0.03, head CT in ED, negative for acute abnormality. -Continue supportive therapy with meclizine 25 TID, zofran -Gentle hydration NS 75cc/hr X10hrs -Explained to patient that interventions for her vertigo are limited at this point, and pt might benefit from rehab stay.  Patient and daughter are hesitant about rehab stay and will decide tomorrow. They are requesting ENT evaluation, as recommended by previous provider, which I have told them would be an outpatient referral. -Hesitant to use benzos with age and advanced CKD -Physical therapy and social work consult  CKD 4-mild elevated creatinine elevated at 2.46.  Down to 1.9-2.1 last admission.  Dry mucous membranes.  On torsemide 40 daily. -Hold torsemide -hydrate - BMP a.m  DM - SSI  DVT prophylaxis: Lovenox Code Status: Full Family Communication: Daughter at bedside Disposition Plan: to be determined, likely SNF Consults called: None Admission status: inpt, med surg   Bethena Roys MD [facility] Pager [phone]  If 11PM-7AM, please contact night-coverage [email or web address] Password [email or web address]  02/05/2017, 6:42 PM

## 2017-02-05 NOTE — ED Triage Notes (Addendum)
PT c/o generalized weakness, dizziness continued since d/c from hospital this past week. PT last chemo treatment was about a month ago.

## 2017-02-06 ENCOUNTER — Encounter (HOSPITAL_COMMUNITY): Payer: Self-pay

## 2017-02-06 LAB — BASIC METABOLIC PANEL
ANION GAP: 9 (ref 5–15)
BUN: 19 mg/dL (ref 6–20)
CHLORIDE: 102 mmol/L (ref 101–111)
CO2: 23 mmol/L (ref 22–32)
Calcium: 8.5 mg/dL — ABNORMAL LOW (ref 8.9–10.3)
Creatinine, Ser: 1.93 mg/dL — ABNORMAL HIGH (ref 0.44–1.00)
GFR calc non Af Amer: 23 mL/min — ABNORMAL LOW (ref >60)
GFR, EST AFRICAN AMERICAN: 27 mL/min — AB (ref >60)
Glucose, Bld: 150 mg/dL — ABNORMAL HIGH (ref 65–99)
POTASSIUM: 4.3 mmol/L (ref 3.5–5.1)
Sodium: 134 mmol/L — ABNORMAL LOW (ref 135–145)

## 2017-02-06 LAB — GLUCOSE, CAPILLARY
GLUCOSE-CAPILLARY: 126 mg/dL — AB (ref 65–99)
Glucose-Capillary: 136 mg/dL — ABNORMAL HIGH (ref 65–99)
Glucose-Capillary: 189 mg/dL — ABNORMAL HIGH (ref 65–99)
Glucose-Capillary: 205 mg/dL — ABNORMAL HIGH (ref 65–99)

## 2017-02-06 LAB — URINE CULTURE: CULTURE: NO GROWTH

## 2017-02-06 NOTE — Progress Notes (Signed)
PROGRESS NOTE    Joann Hunter  DQQ:[id number]  DOB: 1932/02/14  DOA: 02/05/2017 PCP: Monico Blitz, MD   Brief Admission Hx: Joann Hunter is a 84 y.o. female with medical history significant for CKD4, DM2, CAD, HTN, hypothyroidism, G1DD, metastatic urothelial cancer.  Patient with 2 recent hospital admissions 1/19- 1/23, managed for pulmonary edema, 2/2 dCHF and most recent 01/28/17- 02/03/17-admission for  vertigo thought to be due to BPPV, also acute on chronic CKD 4, to be cardiorenal.  Admission workup included MRI and echo which were unremarkable.   Patient presented today with same complaints of vertigo, which patient reports has been persistent since discharge.  Per daughter patient cannot function, due to dizziness, meclizine taking as prescribed has not helped.  Symptoms are worse when standing and present but minimal when lying down.  At baseline patient ambulates with a walker.   MDM/Assessment & Plan:    1. Vertigo: Acute on chronic.  BPPV.  Pt needs ongoing vestibular rehab therapy and SNF placement. Orthostatic hypotension may be contributing some.  She is clinically dehydrated.  MRI brain without acute abnormality.  vestib rehab eval shows definite BPPV.  Outpatient ENT evaluation recommended.  2. Hyponatremia: Improving with gentle IVF hydration.   3. Acute on stage IV chronic kidney disease: Baseline creatinine may be in the 1.8-2 range.  Improved with IVF hydration.    4. Essential hypertension: Controlled.   5. Type II DM with renal complications:   Continue NovoLog SSI. 6. Hypothyroid: TSH 2.347 on 12/27.  Continue Synthroid. 7. Anemia: Multifactorial: Chronic disease, malignancy, chemotherapy, chronic kidney disease.  Status post recent PRBC transfusion.  Stable. 8. Hypokalemia: Replaced. 9. Chronic diastolic CHF: Holding oral torsemide. 10. Metastatic urothelial carcinoma: Outpatient follow-up with oncology/Dr. Lebron Conners. 11. Recent Klebsiella UTI: No urinary  symptoms, fever or leukocytosis.  Was fully treated during previous hospitalizations.  DVT prophylaxis: Lovenox Code Status: Full Family Communication: Daughter  Disposition Plan: to be determined, likely SNF Consults called: None Admission status: inpt, med surg  Subjective: Pt says that she is afraid to try to get up because of recurrent dizziness although she does not feel dizzy this morning in bed.  She usually gets dizzy upon getting up and trying to ambulate.   Objective: Vitals:   02/05/17 1730 02/05/17 1843 02/05/17 2110 02/06/17 0502  BP: (!) 132/57 (!) 146/48 (!) 158/59 (!) 139/51  Pulse: 64 65 76 81  Resp: 18 18 18 18   Temp:  98.6 F (37 C) 98.9 F (37.2 C) 99.5 F (37.5 C)  TempSrc:  Oral Oral Oral  SpO2: 96% 97% 100% 98%  Weight:  79.4 kg (175 lb 0.7 oz)    Height:  5\' 2"  (1.575 m)      Intake/Output Summary (Last 24 hours) at 02/06/2017 1024 Last data filed at 02/06/2017 0539 Gross per 24 hour  Intake 1255 ml  Output 100 ml  Net 1155 ml   Filed Weights   02/05/17 1250 02/05/17 1843  Weight: 75.8 kg (167 lb) 79.4 kg (175 lb 0.7 oz)     REVIEW OF SYSTEMS  As per history otherwise all reviewed and reported negative  Exam:  General exam: awake, alert, NAD, cooperative.  Dry mucus membranes.   Respiratory system:  No increased work of breathing. Cardiovascular system: S1 & S2 heard.   No JVD, murmurs, gallops, clicks or pedal edema. Gastrointestinal system: Abdomen is nondistended, soft and nontender. Normal bowel sounds heard. Central nervous system: Alert and oriented. No focal neurological  deficits. Extremities: no CCE.  Data Reviewed: Basic Metabolic Panel: Recent Labs  Lab 01/31/17 0435 02/01/17 0428 02/02/17 0249 02/03/17 0842 02/05/17 1340 02/05/17 1354 02/06/17 0647  NA 128* 124* 125* 129* 132*  --  134*  K 3.8 3.4* 3.3* 3.8 4.0  --  4.3  CL 87* 85* 86* 93* 98*  --  102  CO2 30 28 29 25 24   --  23  GLUCOSE 131* 130* 121* 142* 220*   --  150*  BUN 19 17 18 19  22*  --  19  CREATININE 2.14* 1.99* 2.01* 2.12* 2.46*  --  1.93*  CALCIUM 8.4* 8.3* 8.3* 8.5* 8.9  --  8.5*  MG 1.6*  --   --   --   --  2.1  --   PHOS  --   --  3.8 3.1  --   --   --    Liver Function Tests: Recent Labs  Lab 02/01/17 0428 02/02/17 0249 02/03/17 0842 02/05/17 1340  AST 45*  --   --  47*  ALT 15  --   --  21  ALKPHOS 63  --   --  75  BILITOT 0.7  --   --  0.6  PROT 5.2*  --   --  5.8*  ALBUMIN 2.4* 2.4* 2.5* 2.7*   No results for input(s): LIPASE, AMYLASE in the last 168 hours. No results for input(s): AMMONIA in the last 168 hours. CBC: Recent Labs  Lab 01/31/17 0435 02/05/17 1340  WBC 4.4 5.1  NEUTROABS  --  3.0  HGB 11.0* 10.7*  HCT 32.9* 33.5*  MCV 94.5 100.0  PLT 265 301   Cardiac Enzymes: Recent Labs  Lab 02/05/17 1340  TROPONINI <0.03   CBG (last 3)  Recent Labs    02/05/17 2107 02/06/17 0823  GLUCAP 184* 126*   No results found for this or any previous visit (from the past 240 hour(s)).   Studies: Dg Chest 2 View  Result Date: 02/05/2017 CLINICAL DATA:  Weakness, vertigo, chemotherapy last month EXAM: CHEST  2 VIEW COMPARISON:  None. FINDINGS: There is a right-sided Port-A-Cath with the tip projecting over the SVC. There is no focal parenchymal opacity. There is no pleural effusion or pneumothorax. The heart and mediastinal contours are unremarkable. There is thoracic aortic atherosclerosis. The osseous structures are unremarkable. IMPRESSION: No active cardiopulmonary disease. Electronically Signed   By: Kathreen Devoid   On: 02/05/2017 13:55   Ct Head Wo Contrast  Result Date: 02/05/2017 CLINICAL DATA:  Vertigo, generalize weakness and dizziness EXAM: CT HEAD WITHOUT CONTRAST TECHNIQUE: Contiguous axial images were obtained from the base of the skull through the vertex without intravenous contrast. COMPARISON:  MR brain 01/29/2017 FINDINGS: Brain: No evidence of acute infarction, hemorrhage, extra-axial  collection, ventriculomegaly, or mass effect. Generalized cerebral atrophy. Periventricular white matter low attenuation likely secondary to microangiopathy. Vascular: Cerebrovascular atherosclerotic calcifications are noted. Skull: Negative for fracture or focal lesion. Sinuses/Orbits: Visualized portions of the orbits are unremarkable. Visualized portions of the paranasal sinuses and mastoid air cells are unremarkable. Other: None. IMPRESSION: 1. No acute intracranial pathology. Electronically Signed   By: Kathreen Devoid   On: 02/05/2017 13:43   Scheduled Meds: . aspirin EC  81 mg Oral Daily  . feeding supplement (ENSURE ENLIVE)  237 mL Oral BID BM  . heparin  5,000 Units Subcutaneous Q8H  . insulin aspart  0-9 Units Subcutaneous TID WC  . levothyroxine  112 mcg Oral QAC breakfast  .  meclizine  25 mg Oral TID   Continuous Infusions:  Principal Problem:   Vertigo Active Problems:   Chronic kidney disease (CKD), stage IV (severe) (HCC)   Type 2 diabetes with nephropathy (HCC)   Urothelial cancer ([place])  Time spent:   Irwin Brakeman, MD, FAAFP [facility] Pager [phone]  If 7PM-7AM, please contact night-coverage [email or web address] Password [email or web address] 02/06/2017, 10:24 AM    LOS: 1 day

## 2017-02-06 NOTE — Care Management Note (Signed)
Case Management Note  Patient Details  Name: Joann Hunter MRN: [id number] Date of Birth: 21-Mar-1932  Subjective/Objective:   Adm with vertigo on 02/05/2017. Recent admissions on 01/21/2017-01/25/2017 and 01/28/2017-02/03/2017. From home. Active with [facility] ([place]). Patient is receiving RN and PT services for vestibular rehab. Patient has been recommended for SNF. Family/patient has declined.  Attending notified.            Action/Plan: CM following for needs. DC home with resumption of HH unless patient changes her mind.   Expected Discharge Date:   02/07/2017              Expected Discharge Plan:     In-House Referral:  Clinical Social Work  Discharge planning Services  CM Consult  Post Acute Care Choice:    Choice offered to:     DME Arranged:    DME Agency:     HH Arranged:    HH Agency:     Status of Service:  In process, will continue to follow  If discussed at Long Length of Stay Meetings, dates discussed:    Additional Comments:  Joann Hunter, Joann Reading, RN 02/06/2017, 3:00 PM

## 2017-02-06 NOTE — Clinical Social Work Note (Signed)
Patient's daughter, Tye [place], advised that patient will be going home.  She was not interested in SNF at this time.    Mario Coronado, Clydene Pugh, LCSW

## 2017-02-06 NOTE — Plan of Care (Signed)
  Acute Rehab PT Goals(only PT should resolve) Pt Will Go Supine/Side To Sit 02/06/2017 1409 - Progressing by Lonell Grandchild, PT Flowsheets Taken 02/06/2017 1409  Pt will go Supine/Side to Sit with min guard assist Patient Will Transfer Sit To/From Stand 02/06/2017 1409 - Progressing by Lonell Grandchild, PT Flowsheets Taken 02/06/2017 1409  Patient will transfer sit to/from stand with min guard assist Pt Will Transfer Bed To Chair/Chair To Bed 02/06/2017 1409 - Progressing by Lonell Grandchild, PT Flowsheets Taken 02/06/2017 1409  Pt will Transfer Bed to Chair/Chair to Bed with min assist Pt Will Ambulate 02/06/2017 1409 - Progressing by Lonell Grandchild, PT Flowsheets Taken 02/06/2017 1409  Pt will Ambulate with min guard assist;50 feet;with rolling walker  2:09 PM, 02/06/17 Lonell Grandchild, MPT Physical Therapist with [facility] [phone] office [phone] mobile phone

## 2017-02-06 NOTE — Evaluation (Signed)
Physical Therapy Evaluation Patient Details Name: Joann Hunter MRN: [id number] DOB: [birth year] Today's Date: 02/06/2017   History of Present Illness  Joann Hunter is a 84 y.o. female with medical history significant for CKD4, DM2, CAD, HTN, hypothyroidism, G1DD, metastatic urothelial cancer.  Patient presented today with same complaints of vertigo, which patient reports has been persistent since discharge.  Per daughter patient cannot function, due to dizziness, meclizine taking as prescribed has not helped.  Symptoms are worse when standing and present but minimal when lying down.  At baseline patient ambulates with a walker.  Apparently per patient and her daughter they declined nursing home discharge last admission.  They were sent home with home health PT but first PT visit will be tomorrow.  Patient endorses poor p.o. intake over the past few days.     Clinical Impression  Patient limited for functional mobility as stated below secondary to BLE weakness, fatigue and poor standing balance.  Patient will benefit from continued physical therapy in hospital and recommended venue below to increase strength, balance, endurance for safe ADLs and gait.     Follow Up Recommendations SNF;Supervision for mobility/OOB    Equipment Recommendations  None recommended by PT    Recommendations for Other Services       Precautions / Restrictions Precautions Precautions: Fall Restrictions Weight Bearing Restrictions: No      Mobility  Bed Mobility Overal bed mobility: Needs Assistance Bed Mobility: Supine to Sit;Sit to Supine     Supine to sit: Min assist Sit to supine: Min assist   General bed mobility comments: slow labored, need assist to move BLE back onto bed during sit to supine  Transfers Overall transfer level: Needs assistance Equipment used: Rolling walker (2 wheeled) Transfers: Sit to/from Stand Sit to Stand: Min assist            Ambulation/Gait Ambulation/Gait  assistance: Min assist Ambulation Distance (Feet): 18 Feet Assistive device: Rolling walker (2 wheeled) Gait Pattern/deviations: Decreased step length - right;Decreased step length - left;Decreased stride length   Gait velocity interpretation: Below normal speed for age/gender General Gait Details: demonstates slow labored movement, limited secondary to c/o fatigue  Stairs            Wheelchair Mobility    Modified Rankin (Stroke Patients Only)       Balance Overall balance assessment: Needs assistance Sitting-balance support: No upper extremity supported;Feet supported Sitting balance-Leahy Scale: Fair     Standing balance support: Bilateral upper extremity supported;During functional activity Standing balance-Leahy Scale: Poor Standing balance comment: fair/poor                             Pertinent Vitals/Pain Pain Assessment: No/denies pain    Home Living Family/patient expects to be discharged to:: Private residence Living Arrangements: Spouse/significant other;Children Available Help at Discharge: Family Type of Home: House Home Access: Level entry     Home Layout: One level Home Equipment: Environmental consultant - 2 wheels;Bedside commode;Shower seat;Wheelchair - manual;Hospital bed;Grab bars - tub/shower      Prior Function Level of Independence: Needs assistance   Gait / Transfers Assistance Needed: household ambulation with RW  ADL's / Homemaking Assistance Needed: assisted by family        Hand Dominance   Dominant Hand: Right    Extremity/Trunk Assessment   Upper Extremity Assessment Upper Extremity Assessment: Generalized weakness    Lower Extremity Assessment Lower Extremity Assessment: Generalized weakness  Cervical / Trunk Assessment Cervical / Trunk Assessment: Normal  Communication   Communication: HOH;No difficulties  Cognition Arousal/Alertness: Awake/alert Behavior During Therapy: WFL for tasks assessed/performed Overall  Cognitive Status: Within Functional Limits for tasks assessed                                        General Comments      Exercises     Assessment/Plan    PT Assessment Patient needs continued PT services  PT Problem List Decreased strength;Decreased activity tolerance;Decreased balance;Decreased mobility       PT Treatment Interventions Gait training;Functional mobility training;Therapeutic activities;Therapeutic exercise;Patient/family education    PT Goals (Current goals can be found in the Care Plan section)  Acute Rehab PT Goals Patient Stated Goal: return home with family to assist PT Goal Formulation: With patient Time For Goal Achievement: 02/13/17 Potential to Achieve Goals: Good    Frequency Min 3X/week   Barriers to discharge        Co-evaluation               AM-PAC PT "6 Clicks" Daily Activity  Outcome Measure Difficulty turning over in bed (including adjusting bedclothes, sheets and blankets)?: A Little Difficulty moving from lying on back to sitting on the side of the bed? : A Little Difficulty sitting down on and standing up from a chair with arms (e.g., wheelchair, bedside commode, etc,.)?: A Lot Help needed moving to and from a bed to chair (including a wheelchair)?: A Lot Help needed walking in hospital room?: A Lot Help needed climbing 3-5 steps with a railing? : A Lot 6 Click Score: 14    End of Session   Activity Tolerance: Patient limited by fatigue Patient left: in bed;with call bell/phone within reach;with bed alarm set Nurse Communication: Mobility status PT Visit Diagnosis: Unsteadiness on feet (R26.81);Other abnormalities of gait and mobility (R26.89);Muscle weakness (generalized) (M62.81);BPPV;Dizziness and giddiness (R42)    Time: 1001-1033 PT Time Calculation (min) (ACUTE ONLY): 32 min   Charges:   PT Evaluation $PT Eval Moderate Complexity: 1 Mod PT Treatments $Therapeutic Activity: 23-37 mins   PT G  Codes:        2:06 PM, 02/10/2017 Lonell Grandchild, MPT Physical Therapist with [facility] 336 [phone] office [phone] mobile phone

## 2017-02-07 LAB — CBC
HEMATOCRIT: 32.8 % — AB (ref 36.0–46.0)
Hemoglobin: 10.7 g/dL — ABNORMAL LOW (ref 12.0–15.0)
MCH: 32.7 pg (ref 26.0–34.0)
MCHC: 32.6 g/dL (ref 30.0–36.0)
MCV: 100.3 fL — AB (ref 78.0–100.0)
Platelets: 287 10*3/uL (ref 150–400)
RBC: 3.27 MIL/uL — ABNORMAL LOW (ref 3.87–5.11)
RDW: 16.8 % — AB (ref 11.5–15.5)
WBC: 4.7 10*3/uL (ref 4.0–10.5)

## 2017-02-07 LAB — BASIC METABOLIC PANEL
Anion gap: 8 (ref 5–15)
BUN: 18 mg/dL (ref 6–20)
CHLORIDE: 102 mmol/L (ref 101–111)
CO2: 25 mmol/L (ref 22–32)
Calcium: 8.7 mg/dL — ABNORMAL LOW (ref 8.9–10.3)
Creatinine, Ser: 1.65 mg/dL — ABNORMAL HIGH (ref 0.44–1.00)
GFR calc Af Amer: 32 mL/min — ABNORMAL LOW (ref >60)
GFR calc non Af Amer: 28 mL/min — ABNORMAL LOW (ref >60)
GLUCOSE: 188 mg/dL — AB (ref 65–99)
POTASSIUM: 4.2 mmol/L (ref 3.5–5.1)
Sodium: 135 mmol/L (ref 135–145)

## 2017-02-07 LAB — GLUCOSE, CAPILLARY
GLUCOSE-CAPILLARY: 181 mg/dL — AB (ref 65–99)
Glucose-Capillary: 126 mg/dL — ABNORMAL HIGH (ref 65–99)
Glucose-Capillary: 217 mg/dL — ABNORMAL HIGH (ref 65–99)
Glucose-Capillary: 124 mg/dL — ABNORMAL HIGH (ref 65–99)

## 2017-02-07 MED ORDER — SODIUM CHLORIDE 0.9 % IV BOLUS (SEPSIS)
500.0000 mL | Freq: Once | INTRAVENOUS | Status: AC
  Administered 2017-02-07: 500 mL via INTRAVENOUS

## 2017-02-07 NOTE — Clinical Social Work Note (Signed)
Patient's daughter, Tye [place], advised that they are now interested in [facility]. They indicated that if patient is not able to get in to [facility] they will take her home.    Dontario Evetts, Clydene Pugh, LCSW

## 2017-02-07 NOTE — NC FL2 (Signed)
[place] LEVEL OF CARE SCREENING TOOL     IDENTIFICATION  Patient Name: Joann Hunter Birthdate: February 15, 1932 Sex: female Admission Date (Current Location): 02/05/2017  [facility] [place] and [place] Number:  [employer] and Address:  [place] [address] [place]      Provider Number: [id number]  Attending Physician Name and Address:  Murlean Iba, MD  Relative Name and Phone Number:       Current Level of Care: Hospital Recommended Level of Care: [place] Prior Approval Number:    Date Approved/Denied:   PASRR Number: [id number])  Discharge Plan: SNF    Current Diagnoses: Patient Active Problem List   Diagnosis Date Noted  . AKI (acute kidney injury) ([place]) 01/29/2017  . Pressure injury of skin 01/23/2017  . Pulmonary edema 01/21/2017  . Pain and swelling of left lower leg 01/21/2017  . Hypokalemia 06/16/2016  . Hip fracture, unspecified laterality, closed, initial encounter ([place]) 04/28/2016  . Ischemic cardiomyopathy 04/28/2016  . Hyperlipidemia 04/28/2016  . Cancer ([place]) 04/28/2016  . Hip fracture ([place]) 04/28/2016  . Dehydration 04/14/2016  . Orthostasis 02/24/2016  . Acute encephalopathy 02/24/2016  . Confusion 02/21/2016  . Neutropenia ([place]) 02/20/2016  . Thrombocytopenia ([place]) 02/20/2016  . ESBL (extended spectrum beta-lactamase) producing bacteria infection 02/20/2016  . Goals of care, counseling/discussion 02/02/2016  . Urothelial cancer ([place]) 01/01/2016  . Rectal bleeding 11/05/2014  . Absolute anemia 11/05/2014  . Hypothyroidism 11/05/2014  . Metabolic encephalopathy 09/32/6712  . Type 2 diabetes with nephropathy ([place]) 07/26/2014  . Essential hypertension 07/26/2014  . Hyponatremia 07/25/2014  . Vertigo 07/25/2014  . Acute kidney injury superimposed on chronic kidney disease ([place]) 12/13/2013  . Fall 12/13/2013  . Lower urinary tract infectious disease 12/13/2013  .  Chronic kidney disease (CKD), stage IV (severe) ([place]) 12/13/2013  . Chest pain at rest 04/26/2011  . Syncope, near 04/26/2011  . CAD (coronary artery disease) 04/23/2011  . Cardiomyopathy, ischemic 04/23/2011  . Dyslipidemia 04/23/2011  . Myocardial infarction, anterior wall, initial care ([place]) 04/20/2011    Orientation RESPIRATION BLADDER Height & Weight     Self, Time, Situation, Place  Normal Incontinent Weight: 175 lb 0.7 oz (79.4 kg) Height:  5\' 2"  (157.5 cm)  BEHAVIORAL SYMPTOMS/MOOD NEUROLOGICAL BOWEL NUTRITION STATUS      Continent Diet(Heart Healthy/carb modified)  AMBULATORY STATUS COMMUNICATION OF NEEDS Skin   Limited Assist Verbally PU Stage and Appropriate Care(Stage 1 abdomen)                       Personal Care Assistance Level of Assistance  Bathing, Feeding, Dressing Bathing Assistance: Limited assistance Feeding assistance: Independent Dressing Assistance: Limited assistance     Functional Limitations Info  Sight, Hearing, Speech Sight Info: Adequate Hearing Info: Adequate Speech Info: Adequate    SPECIAL CARE FACTORS FREQUENCY  PT (By licensed PT)     PT Frequency: 5x/week              Contractures Contractures Info: Not present    Additional Factors Info  Code Status, Allergies, Isolation Precautions Code Status Info: Full Code Allergies Info: Ciprofloxacin, Nitrofurantonin, Lipitor, Penicillins     Isolation Precautions Info: 07/26/2014  carbapenem resistant Klebsiella Pneumoniae. Requires isosation each admission.      Current Medications (02/07/2017):  This is the current hospital active medication list Current Facility-Administered Medications  Medication Dose Route Frequency Provider Last Rate Last Dose  . aspirin EC tablet 81  mg  81 mg Oral Daily Emokpae, Ejiroghene E, MD   81 mg at 02/07/17 0809  . feeding supplement (ENSURE ENLIVE) (ENSURE ENLIVE) liquid 237 mL  237 mL Oral BID BM Emokpae, Ejiroghene E, MD   237 mL at 02/06/17  1430  . heparin injection 5,000 Units  5,000 Units Subcutaneous Q8H Emokpae, Ejiroghene E, MD   5,000 Units at 02/07/17 0605  . insulin aspart (novoLOG) injection 0-9 Units  0-9 Units Subcutaneous TID WC Emokpae, Ejiroghene E, MD   3 Units at 02/07/17 1224  . levothyroxine (SYNTHROID, LEVOTHROID) tablet 112 mcg  112 mcg Oral QAC breakfast Emokpae, Ejiroghene E, MD   112 mcg at 02/07/17 0810  . meclizine (ANTIVERT) tablet 25 mg  25 mg Oral TID Emokpae, Ejiroghene E, MD   25 mg at 02/07/17 0810  . ondansetron (ZOFRAN) tablet 4 mg  4 mg Oral Q6H PRN Emokpae, Ejiroghene E, MD       Or  . ondansetron (ZOFRAN) injection 4 mg  4 mg Intravenous Q6H PRN Emokpae, Ejiroghene E, MD         Discharge Medications: Please see discharge summary for a list of discharge medications.  Relevant Imaging Results:  Relevant Lab Results:   Additional Information SSN:  [id number]  Ihor Gully, LCSW

## 2017-02-07 NOTE — Progress Notes (Signed)
Physical Therapy Treatment Patient Details Name: Joann Hunter MRN: [id number] DOB: 04/19/1932 Today's Date: 02/07/2017    History of Present Illness Joann Hunter is a 84 y.o. female with medical history significant for CKD4, DM2, CAD, HTN, hypothyroidism, G1DD, metastatic urothelial cancer.  Patient presented today with same complaints of vertigo, which patient reports has been persistent since discharge.  Per daughter patient cannot function, due to dizziness, meclizine taking as prescribed has not helped.  Symptoms are worse when standing and present but minimal when lying down.  At baseline patient ambulates with a walker.  Apparently per patient and her daughter they declined nursing home discharge last admission.  They were sent home with home health PT but first PT visit will be tomorrow.  Patient endorses poor p.o. intake over the past few days.     PT Comments    Pt asleep upon entry, awakens easily with verbal and gentle tactiel stimulus. Pt performs functional mobility today as at eval 1DA requiring minguard to minA for all, but with no c/o of dizziness until standing at bedside, which resolves after 15-20 seconds. Pt remains very weak, unable to maintain standing for completion for orthostatic vitals d/t weakness in legs. Pt remains somewhat drowsy during session, but is agreeable to 2 sets of chair squats from elevated bed. Pt making progress toward goals overall, orthostatic vitals are WNL this session.     02/07/17 1337  Orthostatic Lying   BP- Lying 130/66 (no dizziness)  Pulse- Lying 88  Orthostatic Sitting  BP- Sitting 151/72 (no dizziness, vertical and horizontal end gaze free of nystagmus. )  Pulse- Sitting 93  Orthostatic Standing at 0 minutes  BP- Standing at 0 minutes 148/52 (only tolerates ~45sec standing, sits pror to completion of BP)  Pulse- Standing at 0 minutes 89     Follow Up Recommendations  SNF;Supervision for mobility/OOB     Equipment  Recommendations  None recommended by PT    Recommendations for Other Services       Precautions / Restrictions Precautions Precautions: Fall Restrictions Weight Bearing Restrictions: No    Mobility  Bed Mobility Overal bed mobility: Needs Assistance Bed Mobility: Supine to Sit;Sit to Supine     Supine to sit: Min guard Sit to supine: Min guard   General bed mobility comments: Heavy effort required to return feet into bed from floor  Transfers Overall transfer level: Needs assistance Equipment used: Rolling walker (2 wheeled) Transfers: Sit to/from Stand Sit to Stand: Min assist         General transfer comment: MinA from EOB; supervision from elevated surface  Ambulation/Gait Ambulation/Gait assistance: (patient refuses at this time, feelign ill)               Stairs            Wheelchair Mobility    Modified Rankin (Stroke Patients Only)       Balance Overall balance assessment: Needs assistance;Modified Independent                                          Cognition Arousal/Alertness: Awake/alert Behavior During Therapy: WFL for tasks assessed/performed Overall Cognitive Status: Within Functional Limits for tasks assessed  Exercises Other Exercises Other Exercises: STS with RW from slight elevated surface: 2x3     General Comments        Pertinent Vitals/Pain Pain Assessment: No/denies pain    Home Living                      Prior Function            PT Goals (current goals can now be found in the care plan section) Acute Rehab PT Goals Patient Stated Goal: return home with family to assist PT Goal Formulation: With patient Time For Goal Achievement: 02/13/17 Potential to Achieve Goals: Good Progress towards PT goals: Progressing toward goals    Frequency    Min 3X/week      PT Plan Current plan remains appropriate     Co-evaluation              AM-PAC PT "6 Clicks" Daily Activity  Outcome Measure  Difficulty turning over in bed (including adjusting bedclothes, sheets and blankets)?: A Little Difficulty moving from lying on back to sitting on the side of the bed? : A Little Difficulty sitting down on and standing up from a chair with arms (e.g., wheelchair, bedside commode, etc,.)?: Unable Help needed moving to and from a bed to chair (including a wheelchair)?: A Lot Help needed walking in hospital room?: A Lot Help needed climbing 3-5 steps with a railing? : Total 6 Click Score: 12    End of Session   Activity Tolerance: Patient limited by fatigue;Patient limited by lethargy Patient left: in bed;with call bell/phone within reach;with bed alarm set Nurse Communication: Mobility status PT Visit Diagnosis: Unsteadiness on feet (R26.81);Other abnormalities of gait and mobility (R26.89);Muscle weakness (generalized) (M62.81);Dizziness and giddiness (R42)     Time: 9983-3825 PT Time Calculation (min) (ACUTE ONLY): 23 min  Charges:  $Therapeutic Exercise: 8-22 mins $Therapeutic Activity: 8-22 mins                    G Codes:       2:07 PM, 2017-03-02 Etta Grandchild, PT, DPT Physical Therapist - [place] [phone] (Office)   , C 2017-03-02, 2:04 PM

## 2017-02-07 NOTE — Progress Notes (Signed)
PROGRESS NOTE    Joann Hunter  OFB:[id number]  DOB: 12/16/1932  DOA: 02/05/2017 PCP: Monico Blitz, MD   Brief Admission Hx: Joann Hunter is a 84 y.o. female with medical history significant for CKD4, DM2, CAD, HTN, hypothyroidism, G1DD, metastatic urothelial cancer.  Patient with 2 recent hospital admissions 1/19- 1/23, managed for pulmonary edema, 2/2 dCHF and most recent 01/28/17- 02/03/17-admission for  vertigo thought to be due to BPPV, also acute on chronic CKD 4, to be cardiorenal.  Admission workup included MRI and echo which were unremarkable.   Patient presented today with same complaints of vertigo, which patient reports has been persistent since discharge.  Per daughter patient cannot function, due to dizziness, meclizine taking as prescribed has not helped.  Symptoms are worse when standing and present but minimal when lying down.  At baseline patient ambulates with a walker.   MDM/Assessment & Plan:    1. Vertigo: Acute on chronic.  BPPV.  Pt needs ongoing vestibular rehab therapy and SNF placement. Orthostatic hypotension may be contributing some.  She is clinically dehydrated.  MRI brain without acute abnormality.  vestib rehab eval shows definite BPPV.  Arrangements have been made for outpatient vestibular rehab per daughter.   2. Hyponatremia: Improved with gentle IVF hydration.   3. Acute on stage IV chronic kidney disease: Baseline creatinine may be in the 1.8-2 range.  Improved with IVF hydration.    4. Essential hypertension: Controlled.   5. Type II DM with renal complications:   Continue NovoLog SSI. 6. Hypothyroid: TSH 2.347 on 12/27.  Continue Synthroid. 7. Anemia: Multifactorial: Chronic disease, malignancy, chemotherapy, chronic kidney disease.  Status post recent PRBC transfusion.  Stable. 8. Hypokalemia: Replaced. 9. Chronic diastolic CHF: Holding oral torsemide. 10. Metastatic urothelial carcinoma: Outpatient follow-up with oncology/Dr.  Lebron Hunter. 11. Recent Klebsiella UTI: No urinary symptoms, fever or leukocytosis.  Was fully treated during previous hospitalizations.  DVT prophylaxis: Lovenox Code Status: Full Family Communication: Daughter  Disposition Plan:SNF Consults called: None Admission status: inpt, med surg  Subjective: Pt still having a lot of dizziness and vertigo symptoms.  She has agreed to SNF.    Objective: Vitals:   02/06/17 0502 02/06/17 2042 02/07/17 0524 02/07/17 1400  BP: (!) 139/51 (!) 152/54 (!) 147/66 (!) 110/57  Pulse: 81 96 (!) 102 99  Resp: 18 18 18 18   Temp: 99.5 F (37.5 C) 99.6 F (37.6 C) 98.7 F (37.1 C) 98.4 F (36.9 C)  TempSrc: Oral Oral Oral Oral  SpO2: 98% 98% 97% 91%  Weight:      Height:        Intake/Output Summary (Last 24 hours) at 02/07/2017 1706 Last data filed at 02/07/2017 1200 Gross per 24 hour  Intake 600 ml  Output 400 ml  Net 200 ml   Filed Weights   02/05/17 1250 02/05/17 1843  Weight: 75.8 kg (167 lb) 79.4 kg (175 lb 0.7 oz)     REVIEW OF SYSTEMS  As per history otherwise all reviewed and reported negative  Exam:  General exam: awake, alert, NAD, cooperative.  Dry mucus membranes.   Respiratory system:  No increased work of breathing. Cardiovascular system: S1 & S2 heard.   No JVD, murmurs, gallops, clicks or pedal edema. Gastrointestinal system: Abdomen is nondistended, soft and nontender. Normal bowel sounds heard. Central nervous system: Alert and oriented. No focal neurological deficits. Extremities: no CCE.  Data Reviewed: Basic Metabolic Panel: Recent Labs  Lab 02/02/17 0249 02/03/17 0842 02/05/17 1340 02/05/17 1354  02/06/17 0647 02/07/17 0941  NA 125* 129* 132*  --  134* 135  K 3.3* 3.8 4.0  --  4.3 4.2  CL 86* 93* 98*  --  102 102  CO2 29 25 24   --  23 25  GLUCOSE 121* 142* 220*  --  150* 188*  BUN 18 19 22*  --  19 18  CREATININE 2.01* 2.12* 2.46*  --  1.93* 1.65*  CALCIUM 8.3* 8.5* 8.9  --  8.5* 8.7*  MG  --   --   --   2.1  --   --   PHOS 3.8 3.1  --   --   --   --    Liver Function Tests: Recent Labs  Lab 02/01/17 0428 02/02/17 0249 02/03/17 0842 02/05/17 1340  AST 45*  --   --  47*  ALT 15  --   --  21  ALKPHOS 63  --   --  75  BILITOT 0.7  --   --  0.6  PROT 5.2*  --   --  5.8*  ALBUMIN 2.4* 2.4* 2.5* 2.7*   No results for input(s): LIPASE, AMYLASE in the last 168 hours. No results for input(s): AMMONIA in the last 168 hours. CBC: Recent Labs  Lab 02/05/17 1340 02/07/17 0941  WBC 5.1 4.7  NEUTROABS 3.0  --   HGB 10.7* 10.7*  HCT 33.5* 32.8*  MCV 100.0 100.3*  PLT 301 287   Cardiac Enzymes: Recent Labs  Lab 02/05/17 1340  TROPONINI <0.03   CBG (last 3)  Recent Labs    02/07/17 0734 02/07/17 1147 02/07/17 1630  GLUCAP 126* 217* 124*   Recent Results (from the past 240 hour(s))  Urine culture     Status: None   Collection Time: 02/05/17  1:05 PM  Result Value Ref Range Status   Specimen Description   Final    URINE, CATHETERIZED Performed at [facility], [address] [place], [place] [address]    Special Requests   Final    NONE Performed at [facility], [address] [place], [place] [address]    Culture   Final    NO GROWTH Performed at [facility], [place] [address] [place], [place] [address]    Report Status 02/06/2017 FINAL  Final     Studies: No results found. Scheduled Meds: . aspirin EC  81 mg Oral Daily  . feeding supplement (ENSURE ENLIVE)  237 mL Oral BID BM  . heparin  5,000 Units Subcutaneous Q8H  . insulin aspart  0-9 Units Subcutaneous TID WC  . levothyroxine  112 mcg Oral QAC breakfast  . meclizine  25 mg Oral TID   Continuous Infusions:  Principal Problem:   Vertigo Active Problems:   Chronic kidney disease (CKD), stage IV (severe) (HCC)   Type 2 diabetes with nephropathy (HCC)   Urothelial cancer ([place])  Time spent:   Joann Brakeman, MD, FAAFP [facility] Pager [phone]  If 7PM-7AM, please contact  night-coverage [email or web address] Password [email or web address] 02/07/2017, 5:06 PM    LOS: 2 days

## 2017-02-08 DIAGNOSIS — E1121 Type 2 diabetes mellitus with diabetic nephropathy: Secondary | ICD-10-CM

## 2017-02-08 DIAGNOSIS — R42 Dizziness and giddiness: Secondary | ICD-10-CM

## 2017-02-08 DIAGNOSIS — C689 Malignant neoplasm of urinary organ, unspecified: Secondary | ICD-10-CM

## 2017-02-08 DIAGNOSIS — N179 Acute kidney failure, unspecified: Principal | ICD-10-CM

## 2017-02-08 DIAGNOSIS — N184 Chronic kidney disease, stage 4 (severe): Secondary | ICD-10-CM

## 2017-02-08 LAB — GLUCOSE, CAPILLARY
Glucose-Capillary: 129 mg/dL — ABNORMAL HIGH (ref 65–99)
Glucose-Capillary: 122 mg/dL — ABNORMAL HIGH (ref 65–99)

## 2017-02-08 LAB — BASIC METABOLIC PANEL
Anion gap: 9 (ref 5–15)
BUN: 17 mg/dL (ref 6–20)
CO2: 24 mmol/L (ref 22–32)
Calcium: 8.8 mg/dL — ABNORMAL LOW (ref 8.9–10.3)
Chloride: 101 mmol/L (ref 101–111)
Creatinine, Ser: 1.53 mg/dL — ABNORMAL HIGH (ref 0.44–1.00)
GFR calc Af Amer: 35 mL/min — ABNORMAL LOW (ref >60)
GFR calc non Af Amer: 31 mL/min — ABNORMAL LOW (ref >60)
GLUCOSE: 135 mg/dL — AB (ref 65–99)
Potassium: 4.4 mmol/L (ref 3.5–5.1)
Sodium: 134 mmol/L — ABNORMAL LOW (ref 135–145)

## 2017-02-08 MED ORDER — TORSEMIDE 20 MG PO TABS: 20.0000 mg | ORAL_TABLET | Freq: Every day | ORAL | 0 refills | Status: DC

## 2017-02-08 MED ORDER — HYDROCODONE-ACETAMINOPHEN 5-325 MG PO TABS: 1.0000 | ORAL_TABLET | Freq: Four times a day (QID) | ORAL | 0 refills | Status: DC | PRN

## 2017-02-08 NOTE — Progress Notes (Signed)
Patient being d/c to [facility]. Report has been called and IV cath removed and intact. No c/o pain at this time or at site.

## 2017-02-08 NOTE — Discharge Summary (Signed)
Physician Discharge Summary  [name] BMW:[id number] DOB: 18-May-1932 DOA: 02/05/2017  PCP: Monico Blitz, MD  Admit date: 02/05/2017 Discharge date: 02/08/2017  Admitted From: home Disposition:  SNF  Recommendations for Outpatient Follow-up:  1. Follow up with PCP in 1-2 weeks 2. Please obtain BMP/CBC in one week  Home Health: Equipment/Devices:  Discharge Condition: stable CODE STATUS: full code Diet recommendation: Heart Healthy / Carb Modified    Brief/Interim Summary: Joann Hunter a 84 y.o.femalewith medical history significantfor CKD4, DM2, CAD, HTN, hypothyroidism, grade 1 diastolic dysfunction,metastatic urothelial cancer. Patient with 2 recent hospital admissions 1/19- 1/23,managed for pulmonary edema due to [place] most recent1/26/19- 02/03/17-admission for vertigo thought to be due to BPPV,also acute on chronic CKD 4,thought to be cardiorenal.Admission workup included MRI and echo which were unremarkable.  Patient presented on the day of admission with same complaints of vertigo,which patient reports had been persistent since her previous discharge.Per daughter patient cannot function,due to dizziness,meclizine taking as prescribed has not helped.Symptoms are worse when standing and present but minimalwhen lying down.At baseline patient ambulates with a walker.    Discharge Diagnoses:  Principal Problem:   Vertigo Active Problems:   Chronic kidney disease (CKD), stage IV (severe) (HCC)   Type 2 diabetes with nephropathy (HCC)   Urothelial cancer ([place])  1. Vertigo: MRI negative for any central process.  Felt to be BPPV. Orthostatics checked were negative.  She is been adequately hydrated.  Seen by physical therapy was recommended skilled nursing facility placement for continued vestibular therapy.   2. Hyponatremia: Improved with gentle IVF hydration.   3. Acute on stage IV chronic kidney disease:Baseline creatinine may be in the 1.8-2  range. Creatinine has improved to 1.5 range today with hydration.  Will resume diuretics at lower dose.  Repeat chemistry in 1 week. 4. Essential hypertension:Controlled. Continue current treatments 5. Type II DM with renal complications: Blood sugars have been stable.  Continue dietary modifications. 6. Hypothyroid: TSH 2.347 on 12/27. Stable.  Continue on Synthroid 7. Anemia:Multifactorial: Chronic disease, malignancy, chemotherapy, chronic kidney disease. Status post recent PRBC transfusion. Stable. 8. Hypokalemia: Replaced. 9. Chronic diastolic UVO:[id number] torsemide on discharge, but at lower dose and she is prone for dehydration. 10. Metastatic urothelial carcinoma: Outpatient follow-up with oncology/Dr. Lebron Conners. 11. Recent Klebsiella UTI: No urinary symptoms, fever or leukocytosis. Was fully treated during previous hospitalization     Discharge Instructions  Discharge Instructions    Diet - low sodium heart healthy   Complete by:  As directed    Increase activity slowly   Complete by:  As directed      Allergies as of 02/08/2017      Reactions   Ciprofloxacin Other (See Comments)   Hallucination   Nitrofurantoin Other (See Comments)   Dizziness: Resulting in a fall   Lipitor [atorvastatin] Other (See Comments)   Muscle Weakness   Penicillins Rash   Has patient had a PCN reaction causing immediate rash, facial/tongue/throat swelling, SOB or lightheadedness with hypotension: unknown Has patient had a PCN reaction causing severe rash involving mucus membranes or skin necrosis: unknown Has patient had a PCN reaction that required hospitalization: unknown Has patient had a PCN reaction occurring within the last 10 years: unknown If all of the above answers are "NO", then may proceed with Cephalosporin use. 2      Medication List    TAKE these medications   aspirin EC 81 MG tablet Take 81 mg by mouth daily.   feeding supplement (ENSURE ENLIVE) Liqd  Take 237 mLs by  mouth 2 (two) times daily between meals.   fish oil-omega-3 fatty acids 1000 MG capsule Take 1 g by mouth daily.   GEMZAR IV Inject into the vein. Day 1,8,15 every 28 days   HYDROcodone-acetaminophen 5-325 MG tablet Commonly known as:  NORCO/VICODIN Take 1 tablet by mouth every 6 (six) hours as needed for moderate pain.   levothyroxine 112 MCG tablet Commonly known as:  SYNTHROID, LEVOTHROID Take 1 tablet (112 mcg total) by mouth daily before breakfast.   lidocaine-prilocaine cream Commonly known as:  EMLA Apply a quarter size amount to affected area 1 hour prior to coming to chemotherapy.  Do not rub in.  Cover with plastic wrap.   meclizine 25 MG tablet Commonly known as:  ANTIVERT Take 25 mg by mouth 4 (four) times daily as needed. Dizziness   nitroGLYCERIN 0.4 MG SL tablet Commonly known as:  NITROSTAT Place 0.4 mg under the tongue every 5 (five) minutes x 3 doses as needed. For chest pain.   prochlorperazine 10 MG tablet Commonly known as:  COMPAZINE Take 1 tablet (10 mg total) by mouth every 6 (six) hours as needed for nausea or vomiting.   promethazine 25 MG tablet Commonly known as:  PHENERGAN Take 25 mg by mouth every 6 (six) hours as needed for nausea or vomiting.   torsemide 20 MG tablet Commonly known as:  DEMADEX Take 1 tablet (20 mg total) by mouth daily. What changed:  how much to take   Vitamin D (Ergocalciferol) 50000 units Caps capsule Commonly known as:  DRISDOL Take 50,000 Units by mouth 2 (two) times a week. Tuesdays and Thurdays      Contact information for after-discharge care    Destination    [facility] .   Service:  Skilled Nursing Contact information: [address] [place] [phone]             Allergies  Allergen Reactions  . Ciprofloxacin Other (See Comments)    Hallucination  . Nitrofurantoin Other (See Comments)    Dizziness: Resulting in a fall  . Lipitor  [Atorvastatin] Other (See Comments)    Muscle Weakness  . Penicillins Rash    Has patient had a PCN reaction causing immediate rash, facial/tongue/throat swelling, SOB or lightheadedness with hypotension: unknown Has patient had a PCN reaction causing severe rash involving mucus membranes or skin necrosis: unknown Has patient had a PCN reaction that required hospitalization: unknown Has patient had a PCN reaction occurring within the last 10 years: unknown If all of the above answers are "NO", then may proceed with Cephalosporin use. 2    Consultations:     Procedures/Studies: Ct Abdomen Pelvis Wo Contrast  Result Date: 01/19/2017 CLINICAL DATA:  Metastatic right upper tract urothelial carcinoma. Dizziness. EXAM: CT ABDOMEN AND PELVIS WITHOUT CONTRAST TECHNIQUE: Multidetector CT imaging of the abdomen and pelvis was performed following the standard protocol without IV contrast. COMPARISON:  12/23/2016 CT chest, abdomen and pelvis. FINDINGS: Lower chest: Hypoventilatory changes at the dependent lung bases. Coronary atherosclerosis. Hepatobiliary: Finely irregular liver surface, compatible with cirrhosis. Subtle hypodense 1.9 x 1.2 cm segment 4B left liver lobe lesion (series 2/image 20), previously 1.4 x 0.9 cm, mildly increased. No additional liver lesions. Normal gallbladder with no radiopaque cholelithiasis. No biliary ductal dilatation. Pancreas: Normal, with no mass or duct dilation. Spleen: Normal size. No mass. Adrenals/Urinary Tract: Normal adrenals. Right ureteropelvic junction 4.6 x 3.7 cm mass (series 2/image 35),  previously 4.3 x 3.6 cm, mildly increased. Stable asymmetric right renal atrophy with stable tumoral dilatation of the right renal collecting system. Right lumbar ureter 2.5 x 1.7 cm mass (series 2/image 43), previously 2.3 x 1.4 cm, mildly increased. No left hydronephrosis. No contour deforming left renal mass. No left renal stones. Bladder is obscured by streak artifact from  the right hip hardware. Tiny foci of gas in the nondependent bladder, presumably due to recent bladder instrumentation. No definite bladder wall thickening. Stomach/Bowel: Small hiatal hernia. Otherwise normal nondistended stomach. Normal caliber small bowel with no small bowel wall thickening. Normal appendix. Normal large bowel with no diverticulosis, large bowel wall thickening or pericolonic fat stranding. Vascular/Lymphatic: Atherosclerotic nonaneurysmal abdominal aorta. No pathologically enlarged lymph nodes in the abdomen or pelvis. Reproductive: Grossly normal uterus.  No adnexal mass. Other: No pneumoperitoneum, ascites or focal fluid collection. Musculoskeletal: No aggressive appearing focal osseous lesions. Right total hip arthroplasty. Stable moderate L2 vertebral compression fracture. Marked thoracolumbar spondylosis. IMPRESSION: 1. Dominant right UPJ tumor has increased in size. 2. Separate right lumbar ureter tumor has also increased in size. 3. Solitary segment 4B left liver lobe metastasis appears mildly increased in size. 4. No new sites of metastatic disease in the abdomen or pelvis. 5. Chronic findings include: Aortic Atherosclerosis (ICD10-I70.0). Coronary atherosclerosis. Hepatic cirrhosis. Small hiatal hernia. Electronically Signed   By: Ilona Sorrel M.D.   On: 01/19/2017 21:47   Dg Chest 2 View  Result Date: 02/05/2017 CLINICAL DATA:  Weakness, vertigo, chemotherapy last month EXAM: CHEST  2 VIEW COMPARISON:  None. FINDINGS: There is a right-sided Port-A-Cath with the tip projecting over the SVC. There is no focal parenchymal opacity. There is no pleural effusion or pneumothorax. The heart and mediastinal contours are unremarkable. There is thoracic aortic atherosclerosis. The osseous structures are unremarkable. IMPRESSION: No active cardiopulmonary disease. Electronically Signed   By: Kathreen Devoid   On: 02/05/2017 13:55   Ct Head Wo Contrast  Result Date: 02/05/2017 CLINICAL DATA:   Vertigo, generalize weakness and dizziness EXAM: CT HEAD WITHOUT CONTRAST TECHNIQUE: Contiguous axial images were obtained from the base of the skull through the vertex without intravenous contrast. COMPARISON:  MR brain 01/29/2017 FINDINGS: Brain: No evidence of acute infarction, hemorrhage, extra-axial collection, ventriculomegaly, or mass effect. Generalized cerebral atrophy. Periventricular white matter low attenuation likely secondary to microangiopathy. Vascular: Cerebrovascular atherosclerotic calcifications are noted. Skull: Negative for fracture or focal lesion. Sinuses/Orbits: Visualized portions of the orbits are unremarkable. Visualized portions of the paranasal sinuses and mastoid air cells are unremarkable. Other: None. IMPRESSION: 1. No acute intracranial pathology. Electronically Signed   By: Kathreen Devoid   On: 02/05/2017 13:43   Ct Head Wo Contrast  Result Date: 01/15/2017 CLINICAL DATA:  Nausea and dizziness for the past month. The dizziness is increasing. EXAM: CT HEAD WITHOUT CONTRAST TECHNIQUE: Contiguous axial images were obtained from the base of the skull through the vertex without intravenous contrast. COMPARISON:  01/04/2017. FINDINGS: Brain: Diffusely enlarged ventricles and subarachnoid spaces. Patchy white matter low density in both cerebral hemispheres. No intracranial hemorrhage, mass lesion or CT evidence of acute infarction. Vascular: No hyperdense vessel or unexpected calcification. Skull: Mild bilateral hyperostosis frontalis. Sinuses/Orbits: Unremarkable. Other: None. IMPRESSION: 1. No acute abnormality. 2. Stable atrophy and chronic small vessel white matter ischemic changes. Electronically Signed   By: Claudie Revering M.D.   On: 01/15/2017 13:57   Mr Brain Wo Contrast  Result Date: 01/29/2017 CLINICAL DATA:  Vertigo EXAM: MRI HEAD WITHOUT CONTRAST  TECHNIQUE: Multiplanar, multiecho pulse sequences of the brain and surrounding structures were obtained without intravenous  contrast. COMPARISON:  Head CT 01/15/2017 FINDINGS: Brain: The midline structures are normal. There is no acute infarct or acute hemorrhage. No mass lesion, hydrocephalus, dural abnormality or extra-axial collection. There is multifocal white matter hyperintensity suggesting chronic ischemic microangiopathy. No age-advanced or lobar predominant atrophy. No chronic microhemorrhage or superficial siderosis. Vascular: Major intracranial arterial and venous sinus flow voids are preserved. Skull and upper cervical spine: The visualized skull base, calvarium, upper cervical spine and extracranial soft tissues are normal. Sinuses/Orbits: No fluid levels or advanced mucosal thickening. No mastoid or middle ear effusion. Normal orbits. IMPRESSION: Chronic ischemic microangiopathy without acute intracranial abnormality. Electronically Signed   By: Ulyses Jarred M.D.   On: 01/29/2017 03:58   US Venous Img Lower Unilateral Left  Result Date: 01/21/2017 CLINICAL DATA:  Left lower extremity pain and swelling. Shortness of breath. EXAM: LEFT LOWER EXTREMITY VENOUS DOPPLER ULTRASOUND TECHNIQUE: Gray-scale sonography with graded compression, as well as color Doppler and duplex ultrasound were performed to evaluate the lower extremity deep venous systems from the level of the common femoral vein and including the common femoral, femoral, profunda femoral, popliteal and calf veins including the posterior tibial, peroneal and gastrocnemius veins when visible. The superficial great saphenous vein was also interrogated. Spectral Doppler was utilized to evaluate flow at rest and with distal augmentation maneuvers in the common femoral, femoral and popliteal veins. COMPARISON:  None. FINDINGS: Contralateral Common Femoral Vein: Respiratory phasicity is normal and symmetric with the symptomatic side. No evidence of thrombus. Normal compressibility. Common Femoral Vein: No evidence of thrombus. Normal compressibility, respiratory  phasicity and response to augmentation. Saphenofemoral Junction: No evidence of thrombus. Normal compressibility and flow on color Doppler imaging. Profunda Femoral Vein: No evidence of thrombus. Normal compressibility and flow on color Doppler imaging. Femoral Vein: No evidence of thrombus. Normal compressibility, respiratory phasicity and response to augmentation. Popliteal Vein: No evidence of thrombus. Normal compressibility, respiratory phasicity and response to augmentation. Calf Veins: No evidence of thrombus. Normal compressibility and flow on color Doppler imaging. Superficial Great Saphenous Vein: No evidence of thrombus. Normal compressibility. Venous Reflux:  None. Other Findings:  None. IMPRESSION: No evidence of deep venous thrombosis. Electronically Signed   By: Dorise Bullion III M.D   On: 01/21/2017 17:00   Ir US Guide Vasc Access Right  Result Date: 01/13/2017 INDICATION: 84 year old female with metastatic urothelial carcinoma. She requires durable IV access for chemotherapy. She presents for port catheter placement. EXAM: IMPLANTED PORT A CATH PLACEMENT WITH ULTRASOUND AND FLUOROSCOPIC GUIDANCE MEDICATIONS: 1 g vancomycin; The antibiotic was administered within an appropriate time interval prior to skin puncture. ANESTHESIA/SEDATION: Versed 1.5 mg IV; Fentanyl 75 mcg IV; Moderate Sedation Time:  15 minutes The patient was continuously monitored during the procedure by the interventional radiology nurse under my direct supervision. FLUOROSCOPY TIME:  0 minutes, 30 seconds (1 mGy) COMPLICATIONS: None immediate. PROCEDURE: The right neck and chest was prepped with chlorhexidine, and draped in the usual sterile fashion using maximum barrier technique (cap and mask, sterile gown, sterile gloves, large sterile sheet, hand hygiene and cutaneous antiseptic). Antibiotic prophylaxis was provided with 1g vancomycin administered IV one hour prior to skin incision. Local anesthesia was attained by  infiltration with 1% lidocaine with epinephrine. Ultrasound demonstrated patency of the right internal jugular vein, and this was documented with an image. Under real-time ultrasound guidance, this vein was accessed with a 21 gauge micropuncture needle and  image documentation was performed. A small dermatotomy was made at the access site with an 11 scalpel. A 0.018" wire was advanced into the SVC and the access needle exchanged for a 64F micropuncture vascular sheath. The 0.018" wire was then removed and a 0.035" wire advanced into the IVC. An appropriate location for the subcutaneous reservoir was selected below the clavicle and an incision was made through the skin and underlying soft tissues. The subcutaneous tissues were then dissected using a combination of blunt and sharp surgical technique and a pocket was formed. A single lumen power injectable portacatheter was then tunneled through the subcutaneous tissues from the pocket to the dermatotomy and the port reservoir placed within the subcutaneous pocket. The venous access site was then serially dilated and a peel away vascular sheath placed over the wire. The wire was removed and the port catheter advanced into position under fluoroscopic guidance. The catheter tip is positioned in the upper right atrium. This was documented with a spot image. The portacatheter was then tested and found to flush and aspirate well. The port was flushed with saline followed by 100 units/mL heparinized saline. The pocket was then closed in two layers using first subdermal inverted interrupted absorbable sutures followed by a running subcuticular suture. The epidermis was then sealed with Dermabond. The dermatotomy at the venous access site was also closed with a single inverted subdermal suture and the epidermis sealed with Dermabond. IMPRESSION: Successful placement of a right IJ approach Power Port with ultrasound and fluoroscopic guidance. The catheter is ready for use.  Signed, Criselda Peaches, MD [facility] Electronically Signed   By: Jacqulynn Cadet M.D.   On: 01/13/2017 14:36   Dg Chest Port 1 View  Result Date: 01/28/2017 CLINICAL DATA:  Hyponatremia. EXAM: PORTABLE CHEST 1 VIEW COMPARISON:  01/21/2017 FINDINGS: The heart size and mediastinal contours are within normal limits. Aortic atherosclerosis. Both lungs are clear. Right-sided power port remains in appropriate position. IMPRESSION: No active disease. Electronically Signed   By: Earle Gell M.D.   On: 01/28/2017 23:24   Dg Chest Portable 1 View  Result Date: 01/21/2017 CLINICAL DATA:  Shortness of breath and wheezing EXAM: PORTABLE CHEST 1 VIEW COMPARISON:  04/27/2016 FINDINGS: Right IJ power port catheter tip lower SVC level. Marked cardiomegaly with mild central edema pattern, suspicious for volume overload or early CHF. Basilar atelectasis noted. No large effusion or pneumothorax. Upper lobes remain clear. Trachea is midline. Aorta is atherosclerotic. Bones are osteopenic. Degenerative changes of the spine and shoulders. IMPRESSION: Cardiomegaly with mild central edema as above. Basilar atelectasis Thoracic aortic atherosclerosis Electronically Signed   By: Jerilynn Mages.  Shick M.D.   On: 01/21/2017 13:57   Ir Fluoro Guide Port Insertion Right  Result Date: 01/13/2017 INDICATION: 84 year old female with metastatic urothelial carcinoma. She requires durable IV access for chemotherapy. She presents for port catheter placement. EXAM: IMPLANTED PORT A CATH PLACEMENT WITH ULTRASOUND AND FLUOROSCOPIC GUIDANCE MEDICATIONS: 1 g vancomycin; The antibiotic was administered within an appropriate time interval prior to skin puncture. ANESTHESIA/SEDATION: Versed 1.5 mg IV; Fentanyl 75 mcg IV; Moderate Sedation Time:  15 minutes The patient was continuously monitored during the procedure by the interventional radiology nurse under my direct supervision. FLUOROSCOPY  TIME:  0 minutes, 30 seconds (1 mGy) COMPLICATIONS: None immediate. PROCEDURE: The right neck and chest was prepped with chlorhexidine, and draped in the usual sterile fashion using maximum barrier technique (cap and mask, sterile gown, sterile gloves, large sterile sheet,  hand hygiene and cutaneous antiseptic). Antibiotic prophylaxis was provided with 1g vancomycin administered IV one hour prior to skin incision. Local anesthesia was attained by infiltration with 1% lidocaine with epinephrine. Ultrasound demonstrated patency of the right internal jugular vein, and this was documented with an image. Under real-time ultrasound guidance, this vein was accessed with a 21 gauge micropuncture needle and image documentation was performed. A small dermatotomy was made at the access site with an 11 scalpel. A 0.018" wire was advanced into the SVC and the access needle exchanged for a 61F micropuncture vascular sheath. The 0.018" wire was then removed and a 0.035" wire advanced into the IVC. An appropriate location for the subcutaneous reservoir was selected below the clavicle and an incision was made through the skin and underlying soft tissues. The subcutaneous tissues were then dissected using a combination of blunt and sharp surgical technique and a pocket was formed. A single lumen power injectable portacatheter was then tunneled through the subcutaneous tissues from the pocket to the dermatotomy and the port reservoir placed within the subcutaneous pocket. The venous access site was then serially dilated and a peel away vascular sheath placed over the wire. The wire was removed and the port catheter advanced into position under fluoroscopic guidance. The catheter tip is positioned in the upper right atrium. This was documented with a spot image. The portacatheter was then tested and found to flush and aspirate well. The port was flushed with saline followed by 100 units/mL heparinized saline. The pocket was then closed  in two layers using first subdermal inverted interrupted absorbable sutures followed by a running subcuticular suture. The epidermis was then sealed with Dermabond. The dermatotomy at the venous access site was also closed with a single inverted subdermal suture and the epidermis sealed with Dermabond. IMPRESSION: Successful placement of a right IJ approach Power Port with ultrasound and fluoroscopic guidance. The catheter is ready for use. Signed, Criselda Peaches, MD [facility] Electronically Signed   By: Jacqulynn Cadet M.D.   On: 01/13/2017 14:36       Subjective: Still feels dizzy on standing. No chest pain or shortness of breath  Discharge Exam: Vitals:   02/07/17 2308 02/08/17 0626  BP: (!) 152/69 (!) 118/57  Pulse: 98 91  Resp:  18  Temp: 98.7 F (37.1 C) 98.4 F (36.9 C)  SpO2: 97% 97%   Vitals:   02/07/17 1400 02/07/17 2123 02/07/17 2308 02/08/17 0626  BP: (!) 110/57  (!) 152/69 (!) 118/57  Pulse: 99  98 91  Resp: 18   18  Temp: 98.4 F (36.9 C)  98.7 F (37.1 C) 98.4 F (36.9 C)  TempSrc: Oral   Oral  SpO2: 91% 97% 97% 97%  Weight:      Height:        General: Pt is alert, awake, not in acute distress Cardiovascular: RRR, S1/S2 +, no rubs, no gallops Respiratory: crackles at bases, no wheezes Abdominal: Soft, NT, ND, bowel sounds + Extremities: trace edema, no cyanosis    The results of significant diagnostics from this hospitalization (including imaging, microbiology, ancillary and laboratory) are listed below for reference.     Microbiology: Recent Results (from the past 240 hour(s))  Urine culture     Status: None   Collection Time: 02/05/17  1:05 PM  Result Value Ref Range Status   Specimen Description   Final    URINE, CATHETERIZED Performed at [facility], [address]  [place], [place] [address]    Special Requests   Final    NONE Performed at [facility], [address] [place], [place] [address]    Culture   Final    NO GROWTH Performed at [facility], [place] [address], [place], [place] [address]    Report Status 02/06/2017 FINAL  Final     Labs: BNP (last 3 results) Recent Labs    01/21/17 1343 01/28/17 2350  BNP 209.0* 16.0   Basic Metabolic Panel: Recent Labs  Lab 02/02/17 0249 02/03/17 0842 02/05/17 1340 02/05/17 1354 02/06/17 0647 02/07/17 0941 02/08/17 0555  NA 125* 129* 132*  --  134* 135 134*  K 3.3* 3.8 4.0  --  4.3 4.2 4.4  CL 86* 93* 98*  --  102 102 101  CO2 29 25 24   --  23 25 24   GLUCOSE 121* 142* 220*  --  150* 188* 135*  BUN 18 19 22*  --  19 18 17   CREATININE 2.01* 2.12* 2.46*  --  1.93* 1.65* 1.53*  CALCIUM 8.3* 8.5* 8.9  --  8.5* 8.7* 8.8*  MG  --   --   --  2.1  --   --   --   PHOS 3.8 3.1  --   --   --   --   --    Liver Function Tests: Recent Labs  Lab 02/02/17 0249 02/03/17 0842 02/05/17 1340  AST  --   --  47*  ALT  --   --  21  ALKPHOS  --   --  75  BILITOT  --   --  0.6  PROT  --   --  5.8*  ALBUMIN 2.4* 2.5* 2.7*   No results for input(s): LIPASE, AMYLASE in the last 168 hours. No results for input(s): AMMONIA in the last 168 hours. CBC: Recent Labs  Lab 02/05/17 1340 02/07/17 0941  WBC 5.1 4.7  NEUTROABS 3.0  --   HGB 10.7* 10.7*  HCT 33.5* 32.8*  MCV 100.0 100.3*  PLT 301 287   Cardiac Enzymes: Recent Labs  Lab 02/05/17 1340  TROPONINI <0.03   BNP: Invalid input(s): POCBNP CBG: Recent Labs  Lab 02/07/17 1147 02/07/17 1630 02/07/17 2109 02/08/17 0758 02/08/17 1157  GLUCAP 217* 124* 181* 129* 122*   D-Dimer No results for input(s): DDIMER in the last 72 hours. Hgb A1c No results for input(s): HGBA1C in the last 72 hours. Lipid Profile No results for input(s): CHOL, HDL, LDLCALC, TRIG, CHOLHDL, LDLDIRECT in the last 72 hours. Thyroid function studies No results for input(s): TSH, T4TOTAL, T3FREE, THYROIDAB in the last 72 hours.  Invalid input(s): FREET3 Anemia  work up No results for input(s): VITAMINB12, FOLATE, FERRITIN, TIBC, IRON, RETICCTPCT in the last 72 hours. Urinalysis    Component Value Date/Time   COLORURINE YELLOW 02/05/2017 [place] 02/05/2017 1305   LABSPEC 1.016 02/05/2017 1305   PHURINE 6.0 02/05/2017 1305   GLUCOSEU NEGATIVE 02/05/2017 1305   HGBUR NEGATIVE 02/05/2017 1305   BILIRUBINUR NEGATIVE 02/05/2017 1305   KETONESUR NEGATIVE 02/05/2017 1305   PROTEINUR NEGATIVE 02/05/2017 1305   UROBILINOGEN 0.2 11/05/2014 2000   NITRITE NEGATIVE 02/05/2017 1305   LEUKOCYTESUR NEGATIVE 02/05/2017 1305   Sepsis Labs Invalid input(s): PROCALCITONIN,  WBC,  LACTICIDVEN Microbiology Recent Results (from the past 240 hour(s))  Urine culture     Status: None   Collection Time: 02/05/17  1:05 PM  Result Value Ref Range Status  Specimen Description   Final    URINE, CATHETERIZED Performed at [facility], [address] [place], [place] [address]    Special Requests   Final    NONE Performed at [facility], [address], [place], [place] [address]    Culture   Final    NO GROWTH Performed at [facility], [place] [address], [place], [place] [address]    Report Status 02/06/2017 FINAL  Final     Time coordinating discharge: Over 30 minutes  SIGNED:   Kathie Dike, MD  [facility] 02/08/2017, 3:15 PM Pager   If 7PM-7AM, please contact night-coverage [email or web address] Password [email or web address]

## 2017-02-08 NOTE — Care Management (Signed)
Discharging to SNF. CM has made [facility] rep aware of DC plan.

## 2017-02-08 NOTE — Clinical Social Work Placement (Signed)
   CLINICAL SOCIAL WORK PLACEMENT  NOTE  Date:  02/08/2017  Patient Details  Name: Joann Hunter MRN: [id number] Date of Birth: 1932-11-11  Clinical Social Work is seeking post-discharge placement for this patient at the [place] level of care (*CSW will initial, date and re-position this form in  chart as items are completed):  Yes   Patient/family provided with [place] Work Department's list of facilities offering this level of care within the geographic area requested by the patient (or if unable, by the patient's family).  Yes   Patient/family informed of their freedom to choose among providers that offer the needed level of care, that participate in Medicare, Medicaid or managed care program needed by the patient, have an available bed and are willing to accept the patient.  Yes   Patient/family informed of [facility]'s ownership interest in [facility] and [facility], as well as of the fact that they are under no obligation to receive care at these facilities.  PASRR submitted to EDS on       PASRR number received on       Existing PASRR number confirmed on 02/07/17     FL2 transmitted to all facilities in geographic area requested by pt/family on 02/07/17     FL2 transmitted to all facilities within larger geographic area on       Patient informed that his/her managed care company has contracts with or will negotiate with certain facilities, including the following:        Yes   Patient/family informed of bed offers received.  Patient chooses bed at [facility]     Physician recommends and patient chooses bed at      Patient to be transferred to [facility] on 02/08/17.  Patient to be transferred to facility by [facility]     Patient family notified on 02/08/17 of transfer.  Name of family member notified:  Debbie and [place]       Additional Comment:     _______________________________________________ Ihor Gully, LCSW 02/08/2017, 3:42 PM

## 2017-02-08 NOTE — Care Management Important Message (Signed)
Important Message  Patient Details  Name: Joann Hunter MRN: [id number] Date of Birth: 1932-05-03   Medicare Important Message Given:  Yes    Sherald Barge, RN 02/08/2017, 11:18 AM

## 2017-03-11 ENCOUNTER — Encounter (HOSPITAL_COMMUNITY): Payer: Self-pay | Admitting: Emergency Medicine

## 2017-03-11 ENCOUNTER — Emergency Department (HOSPITAL_COMMUNITY): Payer: Medicare Other

## 2017-03-11 ENCOUNTER — Observation Stay (HOSPITAL_COMMUNITY)
Admission: EM | Admit: 2017-03-11 | Discharge: 2017-03-16 | Disposition: A | Discharge status: Home / Self Care | Payer: Medicare Other | Attending: Internal Medicine | Admitting: Internal Medicine

## 2017-03-11 ENCOUNTER — Other Ambulatory Visit: Payer: Self-pay

## 2017-03-11 DIAGNOSIS — Z7982 Long term (current) use of aspirin: Secondary | ICD-10-CM | POA: Insufficient documentation

## 2017-03-11 DIAGNOSIS — E860 Dehydration: Secondary | ICD-10-CM

## 2017-03-11 DIAGNOSIS — I252 Old myocardial infarction: Secondary | ICD-10-CM | POA: Insufficient documentation

## 2017-03-11 DIAGNOSIS — Z7189 Other specified counseling: Secondary | ICD-10-CM

## 2017-03-11 DIAGNOSIS — E876 Hypokalemia: Secondary | ICD-10-CM | POA: Diagnosis not present

## 2017-03-11 DIAGNOSIS — C689 Malignant neoplasm of urinary organ, unspecified: Secondary | ICD-10-CM | POA: Diagnosis not present

## 2017-03-11 DIAGNOSIS — I2510 Atherosclerotic heart disease of native coronary artery without angina pectoris: Secondary | ICD-10-CM | POA: Insufficient documentation

## 2017-03-11 DIAGNOSIS — Z809 Family history of malignant neoplasm, unspecified: Secondary | ICD-10-CM | POA: Insufficient documentation

## 2017-03-11 DIAGNOSIS — I129 Hypertensive chronic kidney disease with stage 1 through stage 4 chronic kidney disease, or unspecified chronic kidney disease: Secondary | ICD-10-CM | POA: Insufficient documentation

## 2017-03-11 DIAGNOSIS — R627 Adult failure to thrive: Secondary | ICD-10-CM | POA: Diagnosis not present

## 2017-03-11 DIAGNOSIS — H8110 Benign paroxysmal vertigo, unspecified ear: Secondary | ICD-10-CM | POA: Insufficient documentation

## 2017-03-11 DIAGNOSIS — E1121 Type 2 diabetes mellitus with diabetic nephropathy: Secondary | ICD-10-CM | POA: Insufficient documentation

## 2017-03-11 DIAGNOSIS — I255 Ischemic cardiomyopathy: Secondary | ICD-10-CM | POA: Diagnosis not present

## 2017-03-11 DIAGNOSIS — E1122 Type 2 diabetes mellitus with diabetic chronic kidney disease: Secondary | ICD-10-CM | POA: Diagnosis not present

## 2017-03-11 DIAGNOSIS — N179 Acute kidney failure, unspecified: Secondary | ICD-10-CM

## 2017-03-11 DIAGNOSIS — E785 Hyperlipidemia, unspecified: Secondary | ICD-10-CM

## 2017-03-11 DIAGNOSIS — E43 Unspecified severe protein-calorie malnutrition: Secondary | ICD-10-CM | POA: Diagnosis not present

## 2017-03-11 DIAGNOSIS — Z79890 Hormone replacement therapy (postmenopausal): Secondary | ICD-10-CM | POA: Insufficient documentation

## 2017-03-11 DIAGNOSIS — E871 Hypo-osmolality and hyponatremia: Secondary | ICD-10-CM | POA: Diagnosis not present

## 2017-03-11 DIAGNOSIS — I10 Essential (primary) hypertension: Secondary | ICD-10-CM | POA: Diagnosis not present

## 2017-03-11 DIAGNOSIS — I951 Orthostatic hypotension: Principal | ICD-10-CM

## 2017-03-11 DIAGNOSIS — R339 Retention of urine, unspecified: Secondary | ICD-10-CM | POA: Diagnosis not present

## 2017-03-11 DIAGNOSIS — D696 Thrombocytopenia, unspecified: Secondary | ICD-10-CM | POA: Diagnosis not present

## 2017-03-11 DIAGNOSIS — N184 Chronic kidney disease, stage 4 (severe): Secondary | ICD-10-CM

## 2017-03-11 DIAGNOSIS — E039 Hypothyroidism, unspecified: Secondary | ICD-10-CM | POA: Diagnosis not present

## 2017-03-11 DIAGNOSIS — R55 Syncope and collapse: Secondary | ICD-10-CM

## 2017-03-11 DIAGNOSIS — Z6831 Body mass index (BMI) 31.0-31.9, adult: Secondary | ICD-10-CM | POA: Insufficient documentation

## 2017-03-11 DIAGNOSIS — R42 Dizziness and giddiness: Secondary | ICD-10-CM

## 2017-03-11 DIAGNOSIS — R4182 Altered mental status, unspecified: Secondary | ICD-10-CM | POA: Diagnosis not present

## 2017-03-11 DIAGNOSIS — Z955 Presence of coronary angioplasty implant and graft: Secondary | ICD-10-CM | POA: Insufficient documentation

## 2017-03-11 DIAGNOSIS — Z79899 Other long term (current) drug therapy: Secondary | ICD-10-CM | POA: Insufficient documentation

## 2017-03-11 DIAGNOSIS — Z96641 Presence of right artificial hip joint: Secondary | ICD-10-CM | POA: Insufficient documentation

## 2017-03-11 DIAGNOSIS — Z515 Encounter for palliative care: Secondary | ICD-10-CM

## 2017-03-11 LAB — CBC WITH DIFFERENTIAL/PLATELET
BASOS ABS: 0 10*3/uL (ref 0.0–0.1)
BASOS PCT: 0 %
EOS ABS: 0.3 10*3/uL (ref 0.0–0.7)
Eosinophils Relative: 5 %
HCT: 36.3 % (ref 36.0–46.0)
HEMOGLOBIN: 12 g/dL (ref 12.0–15.0)
Lymphocytes Relative: 23 %
Lymphs Abs: 1.1 10*3/uL (ref 0.7–4.0)
MCH: 31.9 pg (ref 26.0–34.0)
MCHC: 33.1 g/dL (ref 30.0–36.0)
MCV: 96.5 fL (ref 78.0–100.0)
Monocytes Absolute: 0.7 10*3/uL (ref 0.1–1.0)
Monocytes Relative: 14 %
NEUTROS PCT: 58 %
Neutro Abs: 2.8 10*3/uL (ref 1.7–7.7)
Platelets: 147 10*3/uL — ABNORMAL LOW (ref 150–400)
RBC: 3.76 MIL/uL — AB (ref 3.87–5.11)
RDW: 13.9 % (ref 11.5–15.5)
WBC: 4.9 10*3/uL (ref 4.0–10.5)

## 2017-03-11 LAB — URINALYSIS, COMPLETE (UACMP) WITH MICROSCOPIC
BILIRUBIN URINE: NEGATIVE
Glucose, UA: NEGATIVE mg/dL
Hgb urine dipstick: NEGATIVE
Ketones, ur: NEGATIVE mg/dL
Leukocytes, UA: NEGATIVE
Nitrite: NEGATIVE
PH: 5 (ref 5.0–8.0)
Protein, ur: NEGATIVE mg/dL
SPECIFIC GRAVITY, URINE: 1.014 (ref 1.005–1.030)

## 2017-03-11 LAB — TROPONIN I: Troponin I: <0.03 ng/mL (ref <0.03)

## 2017-03-11 LAB — TSH: TSH: 0.531 u[IU]/mL (ref 0.350–4.500)

## 2017-03-11 LAB — BASIC METABOLIC PANEL
Anion gap: 13 (ref 5–15)
BUN: 40 mg/dL — ABNORMAL HIGH (ref 6–20)
CHLORIDE: 88 mmol/L — AB (ref 101–111)
CO2: 33 mmol/L — ABNORMAL HIGH (ref 22–32)
CREATININE: 2.7 mg/dL — AB (ref 0.44–1.00)
Calcium: 12.2 mg/dL — ABNORMAL HIGH (ref 8.9–10.3)
GFR calc non Af Amer: 15 mL/min — ABNORMAL LOW (ref >60)
GFR, EST AFRICAN AMERICAN: 18 mL/min — AB (ref >60)
Glucose, Bld: 156 mg/dL — ABNORMAL HIGH (ref 65–99)
POTASSIUM: 2.7 mmol/L — AB (ref 3.5–5.1)
SODIUM: 134 mmol/L — AB (ref 135–145)

## 2017-03-11 LAB — MAGNESIUM: MAGNESIUM: 1.8 mg/dL (ref 1.7–2.4)

## 2017-03-11 LAB — CBG MONITORING, ED: GLUCOSE-CAPILLARY: 141 mg/dL — AB (ref 65–99)

## 2017-03-11 LAB — ALBUMIN: Albumin: 2.9 g/dL — ABNORMAL LOW (ref 3.5–5.0)

## 2017-03-11 MED ORDER — POLYETHYLENE GLYCOL 3350 17 G PO PACK
17.0000 g | PACK | Freq: Every day | ORAL | Status: DC | PRN
  Administered 2017-03-12: 17 g via ORAL
  Filled 2017-03-11: qty 1

## 2017-03-11 MED ORDER — ONDANSETRON HCL 4 MG/2ML IJ SOLN
4.0000 mg | Freq: Four times a day (QID) | INTRAMUSCULAR | Status: DC | PRN
Start: 2017-03-11 — End: 2017-03-16
  Administered 2017-03-13: 4 mg via INTRAVENOUS
  Filled 2017-03-11: qty 2

## 2017-03-11 MED ORDER — ONDANSETRON HCL 4 MG PO TABS: 4.0000 mg | ORAL_TABLET | Freq: Four times a day (QID) | ORAL | Status: DC | PRN

## 2017-03-11 MED ORDER — METOPROLOL TARTRATE 25 MG PO TABS
12.5000 mg | ORAL_TABLET | Freq: Two times a day (BID) | ORAL | Status: DC
  Administered 2017-03-12 – 2017-03-16 (×8): 12.5 mg via ORAL
  Filled 2017-03-11 (×10): qty 1

## 2017-03-11 MED ORDER — MECLIZINE HCL 12.5 MG PO TABS
25.0000 mg | ORAL_TABLET | Freq: Four times a day (QID) | ORAL | Status: DC | PRN
  Administered 2017-03-12: 25 mg via ORAL
  Filled 2017-03-11: qty 2

## 2017-03-11 MED ORDER — SODIUM CHLORIDE 0.9 % IV BOLUS (SEPSIS)
1000.0000 mL | Freq: Once | INTRAVENOUS | Status: AC
  Administered 2017-03-11: 1000 mL via INTRAVENOUS

## 2017-03-11 MED ORDER — PANTOPRAZOLE SODIUM 40 MG PO TBEC
40.0000 mg | DELAYED_RELEASE_TABLET | Freq: Every day | ORAL | Status: DC
  Administered 2017-03-12 – 2017-03-15 (×5): 40 mg via ORAL
  Filled 2017-03-11 (×5): qty 1

## 2017-03-11 MED ORDER — NITROGLYCERIN 0.4 MG SL SUBL: 0.4000 mg | SUBLINGUAL_TABLET | SUBLINGUAL | Status: DC | PRN

## 2017-03-11 MED ORDER — OMEGA-3 FATTY ACIDS 1000 MG PO CAPS: 1.0000 g | ORAL_CAPSULE | Freq: Every day | ORAL | Status: DC

## 2017-03-11 MED ORDER — SODIUM CHLORIDE 0.9 % IV SOLN
INTRAVENOUS | Status: DC
  Administered 2017-03-11: 15:00:00 via INTRAVENOUS

## 2017-03-11 MED ORDER — ENSURE ENLIVE PO LIQD
237.0000 mL | Freq: Two times a day (BID) | ORAL | Status: DC
  Administered 2017-03-13 – 2017-03-16 (×4): 237 mL via ORAL

## 2017-03-11 MED ORDER — ACETAMINOPHEN 325 MG PO TABS
650.0000 mg | ORAL_TABLET | Freq: Four times a day (QID) | ORAL | Status: DC | PRN
  Administered 2017-03-12 – 2017-03-14 (×2): 650 mg via ORAL
  Filled 2017-03-11 (×3): qty 2

## 2017-03-11 MED ORDER — POTASSIUM CHLORIDE IN NACL 40-0.9 MEQ/L-% IV SOLN
INTRAVENOUS | Status: AC
  Administered 2017-03-11 – 2017-03-12 (×2): 65 mL/h via INTRAVENOUS

## 2017-03-11 MED ORDER — LEVOTHYROXINE SODIUM 112 MCG PO TABS
112.0000 ug | ORAL_TABLET | Freq: Every day | ORAL | Status: DC
  Administered 2017-03-12 – 2017-03-16 (×4): 112 ug via ORAL
  Filled 2017-03-11 (×5): qty 1

## 2017-03-11 MED ORDER — POTASSIUM CHLORIDE IN NACL 20-0.9 MEQ/L-% IV SOLN
Freq: Once | INTRAVENOUS | Status: DC
Start: 2017-03-11 — End: 2017-03-11

## 2017-03-11 MED ORDER — TRAZODONE HCL 50 MG PO TABS
50.0000 mg | ORAL_TABLET | Freq: Every day | ORAL | Status: DC
  Administered 2017-03-12: 50 mg via ORAL
  Filled 2017-03-11 (×2): qty 1

## 2017-03-11 MED ORDER — VITAMIN D (ERGOCALCIFEROL) 1.25 MG (50000 UNIT) PO CAPS
50000.0000 [IU] | ORAL_CAPSULE | ORAL | Status: DC
  Administered 2017-03-14 – 2017-03-16 (×2): 50000 [IU] via ORAL
  Filled 2017-03-11 (×2): qty 1

## 2017-03-11 MED ORDER — OMEGA-3-ACID ETHYL ESTERS 1 G PO CAPS
1.0000 g | ORAL_CAPSULE | Freq: Every day | ORAL | Status: DC
  Administered 2017-03-11 – 2017-03-16 (×5): 1 g via ORAL
  Filled 2017-03-11 (×6): qty 1

## 2017-03-11 MED ORDER — ASPIRIN EC 81 MG PO TBEC
81.0000 mg | DELAYED_RELEASE_TABLET | Freq: Every day | ORAL | Status: DC
  Administered 2017-03-11 – 2017-03-16 (×5): 81 mg via ORAL
  Filled 2017-03-11 (×6): qty 1

## 2017-03-11 MED ORDER — ACETAMINOPHEN 650 MG RE SUPP: 650.0000 mg | Freq: Four times a day (QID) | RECTAL | Status: DC | PRN

## 2017-03-11 MED ORDER — LORAZEPAM 0.5 MG PO TABS
0.5000 mg | ORAL_TABLET | Freq: Every evening | ORAL | Status: DC | PRN
  Administered 2017-03-12: 0.5 mg via ORAL
  Filled 2017-03-11: qty 1

## 2017-03-11 MED ORDER — HYDROCODONE-ACETAMINOPHEN 5-325 MG PO TABS: 1.0000 | ORAL_TABLET | Freq: Four times a day (QID) | ORAL | Status: DC | PRN

## 2017-03-11 MED ORDER — ENOXAPARIN SODIUM 30 MG/0.3ML ~~LOC~~ SOLN
30.0000 mg | SUBCUTANEOUS | Status: DC
  Administered 2017-03-11 – 2017-03-12 (×2): 30 mg via SUBCUTANEOUS
  Filled 2017-03-11 (×2): qty 0.3

## 2017-03-11 NOTE — ED Notes (Signed)
Pt daughter at bedside and reports while assisting pt to bathroom pt "went limp" for a few seconds and family assisted pt to toilet. Pt family reports did not hit head or fall this am.   pt family reports pt has had decreased appetite for last several days as well.

## 2017-03-11 NOTE — ED Notes (Signed)
Critical Value  Potassium 2.7  Dr Tonye Becket informed

## 2017-03-11 NOTE — H&P (Signed)
History and Physical  DANIQUE HARTSOUGH YPP:[id number] DOB: 1932-08-29 DOA: 03/11/2017  Referring physician: Vanita Panda PCP: Monico Blitz, MD   Chief Complaint: passed out at home   HPI: Joann Hunter is a 84 y.o. female with urothelial cancer who has not been treated in the last month due to ongoing medical illnesses and deconditioning was recently discharged from SNF rehab 03/10/17 and had been at home. She continues to be weak and had diminished appetite and oral intake over the last couple of days according to family members.  She had not been eating and drinking very well at the SNF facility.  She apparently had a episode of syncope when she was in the bathroom with her daughter assisting her she apparently went to the bathroom and had a brief moment of silence, and then became less responsive and passed out.  Her daughter was able to assist her to sitting down and she reports that the patient did have some loss of consciousness.  She did not fall or hit her head.  She quickly regained consciousness but does not have clear memory of event.  EMS was called.  The patient had been weak but had not have any specific symptoms prior to this episode.  There was no perceived seizure activity.  The patient has been dealing with BPPV vertigo for the last several weeks and had been supposed to be getting vestibular rehab and training with PT.  It is uncertain that she had gotten that at her rehab facility.  She denies chest pain and SOB.  She denies nausea and emesis.    ED Course: The patient was noted to be ill appearing and fatigued.  Her CT scan did not reveal acute findings.  Her EKG was stable.  Her labs revealed worsening stage 4 CKD and clinical findings of moderate to severe dehydration and orthostasis.  She was given IVF hydration and admission was requested for further management.   Review of Systems: All systems reviewed and apart from history of presenting illness, are negative.  Past Medical  History:  Diagnosis Date  . Arthritis   . Back pain, chronic   . CAD (coronary artery disease)    NSTEMI 04/2011 with newly diagnosed 3V CAD s/p PCI/DES mLAD 04/11/11 with residual dz (per Dr. Burt Knack, should have consideration of PCI of the occluded RCA based on symptoms and/or consideration of outpatient nuclear perfusion testing after the patient recovers from her infarct)  . Cancer ([place])    kidney ( Nov.2017)  . Chronic kidney disease (CKD), stage IV (severe) ([place]) 12/13/2013  . Hyperglycemia   . Hyperlipidemia   . Hypertension   . Hypothyroidism   . Ischemic cardiomyopathy    EF 45% by cath 04/20/11, 50-55% by echo 04/22/11. hypotension limiting medication titration   . Myocardial infarction ([place]) 2013  . Type 2 diabetes with nephropathy ([place]) 07/26/2014  . UTI (urinary tract infection) 12/13/2013  . Vertigo    Past Surgical History:  Procedure Laterality Date  . BACK SURGERY    . BREAST LUMPECTOMY     right  . CORONARY STENT PLACEMENT    . HIP ARTHROPLASTY Right 04/29/2016   Procedure: ARTHROPLASTY BIPOLAR HIP (HEMIARTHROPLASTY);  Surgeon: Carole Civil, MD;  Location: [facility];  Service: Orthopedics;  Laterality: Right;  . IR FLUORO GUIDE PORT INSERTION RIGHT  01/13/2017  . IR US GUIDE VASC ACCESS RIGHT  01/13/2017  . LEFT HEART CATHETERIZATION WITH CORONARY ANGIOGRAM N/A 04/20/2011   Procedure: LEFT HEART  CATHETERIZATION WITH CORONARY ANGIOGRAM;  Surgeon: Burnell Blanks, MD;  Location: [facility] CATH LAB;  Service: Cardiovascular;  Laterality: N/A;  . PERCUTANEOUS CORONARY STENT INTERVENTION (PCI-S) N/A 04/21/2011   Procedure: PERCUTANEOUS CORONARY STENT INTERVENTION (PCI-S);  Surgeon: Sherren Mocha, MD;  Location: [facility] CATH LAB;  Service: Cardiovascular;  Laterality: N/A;   Social History:  reports that  has never smoked. she has never used smokeless tobacco. She reports that she does not drink alcohol or use drugs.  Allergies  Allergen Reactions  . Ciprofloxacin Other (See  Comments)    Hallucination  . Nitrofurantoin Other (See Comments)    Dizziness: Resulting in a fall  . Lipitor [Atorvastatin] Other (See Comments)    Muscle Weakness  . Penicillins Rash    Has patient had a PCN reaction causing immediate rash, facial/tongue/throat swelling, SOB or lightheadedness with hypotension: unknown Has patient had a PCN reaction causing severe rash involving mucus membranes or skin necrosis: unknown Has patient had a PCN reaction that required hospitalization: unknown Has patient had a PCN reaction occurring within the last 10 years: unknown If all of the above answers are "NO", then may proceed with Cephalosporin use. 2    Family History  Problem Relation Age of Onset  . Other Mother   . Cancer Father   . Other Unknown        no known family cardiac disease  . Other Brother        Diptheria  . Depression Sister   . Pneumonia Sister   . Other Brother        murdered    Prior to Admission medications   Medication Sig Start Date End Date Taking? Authorizing Provider  aspirin EC 81 MG tablet Take 81 mg by mouth daily.   Yes [provider]  feeding supplement, ENSURE ENLIVE, (ENSURE ENLIVE) LIQD Take 237 mLs by mouth 2 (two) times daily between meals. 05/02/16  Yes Elgergawy, Silver Huguenin, MD  fish oil-omega-3 fatty acids 1000 MG capsule Take 1 g by mouth daily.   Yes [provider]  fluticasone (FLONASE ALLERGY RELIEF) 50 MCG/ACT nasal spray Place 1 spray into the nose daily as needed for allergies. 01/30/15  Yes [provider]  HYDROcodone-acetaminophen (NORCO/VICODIN) 5-325 MG tablet Take 1 tablet by mouth every 6 (six) hours as needed for moderate pain. 02/08/17  Yes Kathie Dike, MD  levothyroxine (SYNTHROID, LEVOTHROID) 112 MCG tablet Take 1 tablet (112 mcg total) by mouth daily before breakfast. 09/08/16  Yes Holley Bouche, NP  lidocaine-prilocaine (EMLA) cream Apply a quarter size amount to affected area 1 hour prior to  coming to chemotherapy.  Do not rub in.  Cover with plastic wrap. 01/09/17  Yes Burns, Wandra Feinstein, NP  LORazepam (ATIVAN) 0.5 MG tablet Take 1 tablet by mouth at bedtime. 05/03/16  Yes [provider]  meclizine (ANTIVERT) 25 MG tablet Take 25 mg by mouth 4 (four) times daily as needed. Dizziness   Yes [provider]  metoprolol tartrate (LOPRESSOR) 25 MG tablet Take 12.5 mg by mouth 2 (two) times daily. 10/24/16  Yes [provider]  nitroGLYCERIN (NITROSTAT) 0.4 MG SL tablet Place 0.4 mg under the tongue every 5 (five) minutes x 3 doses as needed. For chest pain. 04/23/11  Yes Dunn, Dayna N, PA-C  pantoprazole (PROTONIX) 40 MG tablet Take 1 tablet by mouth at bedtime. 09/29/16  Yes [provider]  prochlorperazine (COMPAZINE) 10 MG tablet Take 1 tablet (10 mg total) by mouth every  6 (six) hours as needed for nausea or vomiting. 01/29/16  Yes Penland, Kelby Fam, MD  promethazine (PHENERGAN) 25 MG tablet Take 25 mg by mouth every 6 (six) hours as needed for nausea or vomiting.   Yes [provider]  torsemide (DEMADEX) 20 MG tablet Take 1 tablet (20 mg total) by mouth daily. 02/08/17  Yes Kathie Dike, MD  traZODone (DESYREL) 50 MG tablet Take 1 tablet by mouth at bedtime. 05/31/16  Yes [provider]  Vitamin D, Ergocalciferol, (DRISDOL) 50000 UNITS CAPS Take 50,000 Units by mouth 2 (two) times a week. Tuesdays and Thurdays 05/30/12  Yes [provider]  Gemcitabine HCl (GEMZAR IV) Inject into the vein. Day 1,8,15 every 28 days    [provider]   Physical Exam: Vitals:   03/11/17 1400 03/11/17 1430 03/11/17 1435 03/11/17 1445  BP: (!) 157/75 (!) 135/108    Pulse:  78 84 84  Resp:  16 17 15   Temp:      TempSrc:      SpO2:  94% 99% 96%  Weight:      Height:         General exam: Moderately built and nourished patient, lying comfortably supine on the gurney in no obvious distress. She appears chronically ill.  She is  cooperative.   Head, eyes and ENT: Nontraumatic and normocephalic. Pupils equally reacting to light and accommodation. Oral mucosa dry.  Neck: Supple. No JVD, carotid bruit or thyromegaly.  Lymphatics: No lymphadenopathy.  Respiratory system: Clear to auscultation. No increased work of breathing.  Cardiovascular system: normal S1 and S2 heard.  Gastrointestinal system: Abdomen is nondistended, soft and nontender. Normal bowel sounds heard. No organomegaly or masses appreciated.  Central nervous system: lethargic but easily arousable. No focal neurological deficits.  Extremities: Symmetric 5 x 5 power. Peripheral pulses symmetrically felt.   Skin: No rashes or acute findings.  Musculoskeletal system: Negative exam.  Psychiatry: Pleasant and cooperative.  Labs on Admission:  Basic Metabolic Panel: Recent Labs  Lab 03/11/17 1215  NA 134*  K 2.7*  CL 88*  CO2 33*  GLUCOSE 156*  BUN 40*  CREATININE 2.70*  CALCIUM 12.2*   Liver Function Tests: No results for input(s): AST, ALT, ALKPHOS, BILITOT, PROT, ALBUMIN in the last 168 hours. No results for input(s): LIPASE, AMYLASE in the last 168 hours. No results for input(s): AMMONIA in the last 168 hours. CBC: Recent Labs  Lab 03/11/17 1215  WBC 4.9  NEUTROABS 2.8  HGB 12.0  HCT 36.3  MCV 96.5  PLT 147*   Cardiac Enzymes: No results for input(s): CKTOTAL, CKMB, CKMBINDEX, TROPONINI in the last 168 hours.  BNP (last 3 results) No results for input(s): PROBNP in the last 8760 hours. CBG: Recent Labs  Lab 03/11/17 1251  GLUCAP 141*    Radiological Exams on Admission: Dg Chest 1 View  Result Date: 03/11/2017 CLINICAL DATA:  Dizziness. History of diabetes, hypertension, coronary artery disease, MI EXAM: CHEST 1 VIEW COMPARISON:  Chest x-rays dated 02/05/2017 and 04/27/2016 FINDINGS: Heart size is upper normal, stable. Atherosclerotic changes noted at the aortic arch. Right chest wall Port-A-Cath is well positioned  with tip at the level of the lower SVC. Lungs are clear.  No pleural effusion or pneumothorax seen. IMPRESSION: 1. No active disease.  No evidence of pneumonia or pulmonary edema. 2. Aortic atherosclerosis. Electronically Signed   By: Franki Cabot M.D.   On: 03/11/2017 13:44   Ct Head Wo Contrast  Result Date:  03/11/2017 CLINICAL DATA:  Syncopal episode today. Weakness. History of renal cancer. EXAM: CT HEAD WITHOUT CONTRAST TECHNIQUE: Contiguous axial images were obtained from the base of the skull through the vertex without intravenous contrast. COMPARISON:  Head CT dated 02/05/2017 FINDINGS: Brain: Ventricles are stable in size and configuration. No hydrocephalus. Mild chronic small vessel ischemic changes within the periventricular and subcortical white matter regions. No mass, hemorrhage, edema or other evidence of acute parenchymal abnormality. No extra-axial hemorrhage. Vascular: There are chronic calcified atherosclerotic changes of the large vessels at the skull base. No unexpected hyperdense vessel. Skull: Normal. Negative for fracture or focal lesion. Sinuses/Orbits: No acute finding. Other: None. IMPRESSION: 1. No acute findings.  No intracranial mass, hemorrhage or edema. 2. Mild chronic small vessel ischemic changes in the white matter. Electronically Signed   By: Franki Cabot M.D.   On: 03/11/2017 13:47   EKG: Personally reviewed. NSR no acute ST-T abnormalities  Assessment/Plan Principal Problem:   Acute renal failure superimposed on stage 4 chronic kidney disease (HCC) Active Problems:   Hypercalcemia of malignancy   CAD (coronary artery disease)   Cardiomyopathy, ischemic   Dyslipidemia   Chronic kidney disease (CKD), stage IV (severe) (HCC)   Hyponatremia   Vertigo   Essential hypertension   Hypothyroidism   Urothelial cancer (HCC)   Thrombocytopenia (HCC)   Orthostasis   Dehydration   Hypokalemia   Vasovagal syncope  1. Orthostatic hypotension - The patient is  dehydrated. Will hold torsemide at this time until hydration has improved.  I would like to see a urinalysis and I am waiting for this to be done.  It has not been collected at this time.  Order given for In/Out cath if unable to void.   2. Vasovagal syncope - I suspect this was exacerbated by her dehydration and vagal stimulation while going to bathroom. Will initiate fall precautions and treating dehydration with IVFs.  Check carotids, EEG for completeness.  3. Moderate to severe dehydration - Gentle IVFs ordered in setting of cardiomyopathy.  4. Acute on chronic CKD stage 4 - Renally dose meds and hydrate with IVFs, follow renal function panel.  5. Ischemic cardiomyopathy - gently hydrate and monitor clinically.  The patient had a recent echo in Jan 2019 that showed preserved EF.   I am recommending that torsemide be held at this time. 6. Hypercalcemia - likely secondary to malignancy, add albumin to labs and treating with IVF hydration, will likely need to add IV pamidronate and calcitonin if remains high.  Will follow.   7. Essential hypertension - resume home blood pressure lowering medications and follow clinically and adjust as needed.  8. Hypothyroidism - check a TSH for completeness.  9. Thrombocytopenia - likely multifactorial cause in setting of ongoing cancer, will follow closely.  10. Hypokalemia - check magnesium and continue replacement in IVFs. Recheck renal function panel and magnesium in AM.   11. Urothelial cancer - the patient has not been able to continue chemotherapy in the last month due to deconditioning and declining rapidly over past 30 days.  Pt and family hopeful to continue chemo treatments soon as patient recovers.  With patient having hypercalcemia of malignancy this is associated with higher mortality, will ask for palliative medicine team to consult to readdress goals of care.  Will consult the oncology service on Monday.   12. BPPV Vertigo - Family says that she never  received the vestibular rehab that she needed and was recommended at discharge.  Apparently the  facility made her an outpatient appointment but it is 3 months out in June. will ask PT  to see her in consultation in the hospital for some vestibular rehab. .   13. Dyslipidemia - resume home statin medication.  14. CAD - stable, resume home cardiac medications, except torsemide, and follow clinically. Troponin testing ordered.  DVT Prophylaxis: lovenox Code Status: Full   Family Communication: daughters at bedside  Disposition Plan: When medically improved, PT eval pending, daughters requesting SNF placement  Time spent: 99 mins  Irwin Brakeman, MD [facility] Pager [phone]  If 7PM-7AM, please contact night-coverage [email or web address] Password [facility] 03/11/2017, 3:34 PM

## 2017-03-11 NOTE — ED Notes (Signed)
ekg handed to Dr. Lockwood 

## 2017-03-11 NOTE — ED Provider Notes (Signed)
[facility] EMERGENCY DEPARTMENT Provider Note   CSN: [id number] Arrival date & time: 03/11/17  1120     History   Chief Complaint Chief Complaint  Patient presents with  . Dizziness    HPI Joann Hunter is a 84 y.o. female.  HPI  Patient presents with her daughter who assists with the HPI. Patient has multiple medical issues including renal cancer, for which she has not received therapy in greater than 1 month. Patient also has ongoing vertigo, and was discharged from a rehabilitation facility yesterday, due to insurance concerns. Today, she was with her daughter, when she had episode of syncope. Report of the patient was attempted to walk, to the bathroom, when she had a brief pause in interactivity, and then after a momentary return of interactivity, had complete syncope. The patient herself denies pain before or after the events, and states that she is currently comfortable, without pain, lightheadedness She does acknowledge dyspnea, it is unclear if this is new. Daughter notes that the patient did not fall entirely, and she was able to assist her sitting down, and with loss of consciousness. In regards to cancer therapy, daughter only knows that there is renal cancer, is unsure of progression, states that family hopes the patient can resume therapy when she has improved from her ongoing vertigo.   Past Medical History:  Diagnosis Date  . Arthritis   . Back pain, chronic   . CAD (coronary artery disease)    NSTEMI 04/2011 with newly diagnosed 3V CAD s/p PCI/DES mLAD 04/11/11 with residual dz (per Dr. Burt Knack, should have consideration of PCI of the occluded RCA based on symptoms and/or consideration of outpatient nuclear perfusion testing after the patient recovers from her infarct)  . Cancer ([place])    kidney ( Nov.2017)  . Chronic kidney disease (CKD), stage IV (severe) ([place]) 12/13/2013  . Hyperglycemia   . Hyperlipidemia   . Hypertension   . Hypothyroidism   . Ischemic  cardiomyopathy    EF 45% by cath 04/20/11, 50-55% by echo 04/22/11. hypotension limiting medication titration   . Myocardial infarction ([place]) 2013  . Type 2 diabetes with nephropathy ([place]) 07/26/2014  . UTI (urinary tract infection) 12/13/2013  . Vertigo     Patient Active Problem List   Diagnosis Date Noted  . Acute renal failure superimposed on stage 4 chronic kidney disease ([place]) 03/11/2017  . Vasovagal syncope 03/11/2017  . AKI (acute kidney injury) ([place]) 01/29/2017  . Pressure injury of skin 01/23/2017  . Pain and swelling of left lower leg 01/21/2017  . Hypokalemia 06/16/2016  . Hip fracture, unspecified laterality, closed, initial encounter ([place]) 04/28/2016  . Ischemic cardiomyopathy 04/28/2016  . Hyperlipidemia 04/28/2016  . Cancer ([place]) 04/28/2016  . Hip fracture ([place]) 04/28/2016  . Dehydration 04/14/2016  . Orthostasis 02/24/2016  . Acute encephalopathy 02/24/2016  . Confusion 02/21/2016  . Neutropenia ([place]) 02/20/2016  . Thrombocytopenia ([place]) 02/20/2016  . ESBL (extended spectrum beta-lactamase) producing bacteria infection 02/20/2016  . Goals of care, counseling/discussion 02/02/2016  . Urothelial cancer ([place]) 01/01/2016  . Rectal bleeding 11/05/2014  . Absolute anemia 11/05/2014  . Hypothyroidism 11/05/2014  . Metabolic encephalopathy 29/93/7169  . Type 2 diabetes with nephropathy ([place]) 07/26/2014  . Essential hypertension 07/26/2014  . Hyponatremia 07/25/2014  . Vertigo 07/25/2014  . Acute kidney injury superimposed on chronic kidney disease ([place]) 12/13/2013  . Fall 12/13/2013  . Lower urinary tract infectious disease 12/13/2013  . Chronic kidney disease (CKD), stage IV (severe) ([place]) 12/13/2013  .  Chest pain at rest 04/26/2011  . Syncope, near 04/26/2011  . CAD (coronary artery disease) 04/23/2011  . Cardiomyopathy, ischemic 04/23/2011  . Dyslipidemia 04/23/2011  . Myocardial infarction, anterior wall, initial care ([place]) 04/20/2011    Past Surgical  History:  Procedure Laterality Date  . BACK SURGERY    . BREAST LUMPECTOMY     right  . CORONARY STENT PLACEMENT    . HIP ARTHROPLASTY Right 04/29/2016   Procedure: ARTHROPLASTY BIPOLAR HIP (HEMIARTHROPLASTY);  Surgeon: Carole Civil, MD;  Location: [facility];  Service: Orthopedics;  Laterality: Right;  . IR FLUORO GUIDE PORT INSERTION RIGHT  01/13/2017  . IR US GUIDE VASC ACCESS RIGHT  01/13/2017  . LEFT HEART CATHETERIZATION WITH CORONARY ANGIOGRAM N/A 04/20/2011   Procedure: LEFT HEART CATHETERIZATION WITH CORONARY ANGIOGRAM;  Surgeon: Burnell Blanks, MD;  Location: [facility] CATH LAB;  Service: Cardiovascular;  Laterality: N/A;  . PERCUTANEOUS CORONARY STENT INTERVENTION (PCI-S) N/A 04/21/2011   Procedure: PERCUTANEOUS CORONARY STENT INTERVENTION (PCI-S);  Surgeon: Sherren Mocha, MD;  Location: [facility] CATH LAB;  Service: Cardiovascular;  Laterality: N/A;    OB History    Gravida Para Term Preterm AB Living   2 2 2     2    SAB TAB Ectopic Multiple Live Births                   Home Medications    Prior to Admission medications   Medication Sig Start Date End Date Taking? Authorizing Provider  aspirin EC 81 MG tablet Take 81 mg by mouth daily.   Yes [provider]  feeding supplement, ENSURE ENLIVE, (ENSURE ENLIVE) LIQD Take 237 mLs by mouth 2 (two) times daily between meals. 05/02/16  Yes Elgergawy, Silver Huguenin, MD  fish oil-omega-3 fatty acids 1000 MG capsule Take 1 g by mouth daily.   Yes [provider]  fluticasone (FLONASE ALLERGY RELIEF) 50 MCG/ACT nasal spray Place 1 spray into the nose daily as needed for allergies. 01/30/15  Yes [provider]  HYDROcodone-acetaminophen (NORCO/VICODIN) 5-325 MG tablet Take 1 tablet by mouth every 6 (six) hours as needed for moderate pain. 02/08/17  Yes Kathie Dike, MD  levothyroxine (SYNTHROID, LEVOTHROID) 112 MCG tablet Take 1 tablet (112 mcg total) by mouth daily before breakfast. 09/08/16  Yes Holley Bouche, NP   lidocaine-prilocaine (EMLA) cream Apply a quarter size amount to affected area 1 hour prior to coming to chemotherapy.  Do not rub in.  Cover with plastic wrap. 01/09/17  Yes Burns, Wandra Feinstein, NP  LORazepam (ATIVAN) 0.5 MG tablet Take 1 tablet by mouth at bedtime. 05/03/16  Yes [provider]  meclizine (ANTIVERT) 25 MG tablet Take 25 mg by mouth 4 (four) times daily as needed. Dizziness   Yes [provider]  metoprolol tartrate (LOPRESSOR) 25 MG tablet Take 12.5 mg by mouth 2 (two) times daily. 10/24/16  Yes [provider]  nitroGLYCERIN (NITROSTAT) 0.4 MG SL tablet Place 0.4 mg under the tongue every 5 (five) minutes x 3 doses as needed. For chest pain. 04/23/11  Yes Dunn, Dayna N, PA-C  pantoprazole (PROTONIX) 40 MG tablet Take 1 tablet by mouth at bedtime. 09/29/16  Yes [provider]  prochlorperazine (COMPAZINE) 10 MG tablet Take 1 tablet (10 mg total) by mouth every 6 (six) hours as needed for nausea or vomiting. 01/29/16  Yes Penland, Kelby Fam, MD  promethazine (PHENERGAN) 25 MG tablet Take 25 mg by mouth every 6 (six) hours as needed for  nausea or vomiting.   Yes [provider]  torsemide (DEMADEX) 20 MG tablet Take 1 tablet (20 mg total) by mouth daily. 02/08/17  Yes Kathie Dike, MD  traZODone (DESYREL) 50 MG tablet Take 1 tablet by mouth at bedtime. 05/31/16  Yes [provider]  Vitamin D, Ergocalciferol, (DRISDOL) 50000 UNITS CAPS Take 50,000 Units by mouth 2 (two) times a week. Tuesdays and Thurdays 05/30/12  Yes [provider]  Gemcitabine HCl (GEMZAR IV) Inject into the vein. Day 1,8,15 every 28 days    [provider]    Family History Family History  Problem Relation Age of Onset  . Other Mother   . Cancer Father   . Other Unknown        no known family cardiac disease  . Other Brother        Diptheria  . Depression Sister   . Pneumonia Sister   . Other Brother        murdered    Social  History Social History   Tobacco Use  . Smoking status: Never Smoker  . Smokeless tobacco: Never Used  Substance Use Topics  . Alcohol use: No  . Drug use: No     Allergies   Ciprofloxacin; Nitrofurantoin; Lipitor [atorvastatin]; and Penicillins   Review of Systems Review of Systems  Constitutional:       Per HPI, otherwise negative  HENT:       Per HPI, otherwise negative  Respiratory:       Per HPI, otherwise negative  Cardiovascular:       Per HPI, otherwise negative  Gastrointestinal: Negative for vomiting.  Endocrine:       Negative aside from HPI  Genitourinary:       Neg aside from HPI   Musculoskeletal:       Per HPI, otherwise negative  Skin: Negative.   Allergic/Immunologic: Positive for immunocompromised state.  Neurological: Positive for dizziness, syncope, weakness and light-headedness.     Physical Exam Updated Vital Signs BP (!) 135/108   Pulse 84   Temp 97.9 F (36.6 C) (Oral)   Resp 15   Ht 5\' 7"  (1.702 m)   Wt 79.4 kg (175 lb)   SpO2 96%   BMI 27.41 kg/m   Physical Exam  Constitutional: She is oriented to person, place, and time. She has a sickly appearance. No distress.  HENT:  Head: Normocephalic and atraumatic.  Eyes: Conjunctivae and EOM are normal.  Cardiovascular: Normal rate and regular rhythm.  Pulmonary/Chest: Effort normal and breath sounds normal. No stridor. No respiratory distress.  Abdominal: She exhibits no distension.  Musculoskeletal: She exhibits no edema.  Neurological: She is oriented to person, place, and time. She displays atrophy. No cranial nerve deficit.  Diffuse atrophy, but no asymmetry of strength, no gross discoordination, patient is quiet, withdrawn, but speech is clear, with no slurring, no facial asymmetry.  Skin: Skin is warm and dry.  Psychiatric: She is slowed and withdrawn.  Nursing note and vitals reviewed.    ED Treatments / Results  Labs (all labs ordered are listed, but only abnormal  results are displayed) Labs Reviewed  CBC WITH DIFFERENTIAL/PLATELET - Abnormal; Notable for the following components:      Result Value   RBC 3.76 (*)    Platelets 147 (*)    All other components within normal limits  BASIC METABOLIC PANEL - Abnormal; Notable for the following components:   Sodium 134 (*)    Potassium 2.7 (*)  Chloride 88 (*)    CO2 33 (*)    Glucose, Bld 156 (*)    BUN 40 (*)    Creatinine, Ser 2.70 (*)    Calcium 12.2 (*)    GFR calc non Af Amer 15 (*)    GFR calc Af Amer 18 (*)    All other components within normal limits  CBG MONITORING, ED - Abnormal; Notable for the following components:   Glucose-Capillary 141 (*)    All other components within normal limits  URINALYSIS, COMPLETE (UACMP) WITH MICROSCOPIC    EKG  EKG Interpretation  Date/Time:  Saturday March 11 2017 12:46:48 EST Ventricular Rate:  81 PR Interval:    QRS Duration: 90 QT Interval:  411 QTC Calculation: 478 R Axis:   27 Text Interpretation:  Sinus rhythm Prolonged PR interval Low voltage, precordial leads Anterior injury pattern Abnormal ekg Confirmed by Carmin Muskrat [phone]) on 03/11/2017 1:10:42 PM       Radiology Dg Chest 1 View  Result Date: 03/11/2017 CLINICAL DATA:  Dizziness. History of diabetes, hypertension, coronary artery disease, MI EXAM: CHEST 1 VIEW COMPARISON:  Chest x-rays dated 02/05/2017 and 04/27/2016 FINDINGS: Heart size is upper normal, stable. Atherosclerotic changes noted at the aortic arch. Right chest wall Port-A-Cath is well positioned with tip at the level of the lower SVC. Lungs are clear.  No pleural effusion or pneumothorax seen. IMPRESSION: 1. No active disease.  No evidence of pneumonia or pulmonary edema. 2. Aortic atherosclerosis. Electronically Signed   By: Franki Cabot M.D.   On: 03/11/2017 13:44   Ct Head Wo Contrast  Result Date: 03/11/2017 CLINICAL DATA:  Syncopal episode today. Weakness. History of renal cancer. EXAM: CT HEAD WITHOUT  CONTRAST TECHNIQUE: Contiguous axial images were obtained from the base of the skull through the vertex without intravenous contrast. COMPARISON:  Head CT dated 02/05/2017 FINDINGS: Brain: Ventricles are stable in size and configuration. No hydrocephalus. Mild chronic small vessel ischemic changes within the periventricular and subcortical white matter regions. No mass, hemorrhage, edema or other evidence of acute parenchymal abnormality. No extra-axial hemorrhage. Vascular: There are chronic calcified atherosclerotic changes of the large vessels at the skull base. No unexpected hyperdense vessel. Skull: Normal. Negative for fracture or focal lesion. Sinuses/Orbits: No acute finding. Other: None. IMPRESSION: 1. No acute findings.  No intracranial mass, hemorrhage or edema. 2. Mild chronic small vessel ischemic changes in the white matter. Electronically Signed   By: Franki Cabot M.D.   On: 03/11/2017 13:47    Procedures Procedures (including critical care time)  Medications Ordered in ED Medications  sodium chloride 0.9 % bolus 1,000 mL (0 mLs Intravenous Stopped 03/11/17 1436)    And  0.9 %  sodium chloride infusion ( Intravenous New Bag/Given 03/11/17 1430)  0.9 % NaCl with KCl 20 mEq/ L  infusion (not administered)     Initial Impression / Assessment and Plan / ED Course  I have reviewed the triage vital signs and the nursing notes.  Pertinent labs & imaging results that were available during my care of the patient were reviewed by me and considered in my medical decision making (see chart for details).     3:01 PM On repeat exam the patient is in similar condition, fatigued, but not disoriented. Findings discussed with patient and her daughter, including elevated BUN, creatinine consistent with acute kidney injury, with near doubling since last month. Patient also has hypokalemia.  Not she had received initial fluid resuscitation, with normal saline, now  will switch to saline with potassium  repletion. Head CT, chest x-ray both reassuring, no evidence for intracranial mass, hemorrhage, nor pneumonia. Given the patient's acute kidney injury, hypokalemia, and new episode of syncope, she will require admission for further evaluation and management.  Final Clinical Impressions(s) / ED Diagnoses   Final diagnoses:  Dizziness  Syncope Hypokalemia Acute kidney injury   Carmin Muskrat, MD 03/11/17 1502

## 2017-03-11 NOTE — ED Triage Notes (Signed)
Per EMS, pt was discharged from [facility] for vertigo on 03/10/17 due to insurance coverage running out. Pt reports took meclizine at home and reports continued dizziness. EMS states pt family reported an upcoming appointment with Neurologist. Pt alert and oriented. nad noted. cbg 179 en route.

## 2017-03-11 NOTE — ED Notes (Signed)
Pt was informed that we need a urine sample. Pt states that she can not use the bathroom at this time.

## 2017-03-12 ENCOUNTER — Inpatient Hospital Stay (HOSPITAL_COMMUNITY): Payer: Medicare Other

## 2017-03-12 DIAGNOSIS — R55 Syncope and collapse: Secondary | ICD-10-CM | POA: Diagnosis not present

## 2017-03-12 DIAGNOSIS — E039 Hypothyroidism, unspecified: Secondary | ICD-10-CM

## 2017-03-12 DIAGNOSIS — D696 Thrombocytopenia, unspecified: Secondary | ICD-10-CM | POA: Diagnosis not present

## 2017-03-12 DIAGNOSIS — I2510 Atherosclerotic heart disease of native coronary artery without angina pectoris: Secondary | ICD-10-CM | POA: Diagnosis not present

## 2017-03-12 DIAGNOSIS — E876 Hypokalemia: Secondary | ICD-10-CM | POA: Diagnosis not present

## 2017-03-12 DIAGNOSIS — N184 Chronic kidney disease, stage 4 (severe): Secondary | ICD-10-CM | POA: Diagnosis not present

## 2017-03-12 DIAGNOSIS — E785 Hyperlipidemia, unspecified: Secondary | ICD-10-CM | POA: Diagnosis not present

## 2017-03-12 DIAGNOSIS — E860 Dehydration: Secondary | ICD-10-CM | POA: Diagnosis not present

## 2017-03-12 DIAGNOSIS — C689 Malignant neoplasm of urinary organ, unspecified: Secondary | ICD-10-CM | POA: Diagnosis not present

## 2017-03-12 DIAGNOSIS — N179 Acute kidney failure, unspecified: Secondary | ICD-10-CM | POA: Diagnosis not present

## 2017-03-12 LAB — TROPONIN I: TROPONIN I: 0.03 ng/mL — AB (ref <0.03)

## 2017-03-12 LAB — COMPREHENSIVE METABOLIC PANEL
ALT: 9 U/L — ABNORMAL LOW (ref 14–54)
ANION GAP: 10 (ref 5–15)
AST: 18 U/L (ref 15–41)
Albumin: 2.5 g/dL — ABNORMAL LOW (ref 3.5–5.0)
Alkaline Phosphatase: 55 U/L (ref 38–126)
BUN: 39 mg/dL — ABNORMAL HIGH (ref 6–20)
CALCIUM: 11.3 mg/dL — AB (ref 8.9–10.3)
CHLORIDE: 98 mmol/L — AB (ref 101–111)
CO2: 28 mmol/L (ref 22–32)
Creatinine, Ser: 2.29 mg/dL — ABNORMAL HIGH (ref 0.44–1.00)
GFR calc non Af Amer: 19 mL/min — ABNORMAL LOW (ref >60)
GFR, EST AFRICAN AMERICAN: 22 mL/min — AB (ref >60)
Glucose, Bld: 120 mg/dL — ABNORMAL HIGH (ref 65–99)
Potassium: 3 mmol/L — ABNORMAL LOW (ref 3.5–5.1)
SODIUM: 136 mmol/L (ref 135–145)
Total Bilirubin: 0.9 mg/dL (ref 0.3–1.2)
Total Protein: 5.4 g/dL — ABNORMAL LOW (ref 6.5–8.1)

## 2017-03-12 LAB — CBC
HEMATOCRIT: 32.8 % — AB (ref 36.0–46.0)
Hemoglobin: 10.7 g/dL — ABNORMAL LOW (ref 12.0–15.0)
MCH: 31.7 pg (ref 26.0–34.0)
MCHC: 32.6 g/dL (ref 30.0–36.0)
MCV: 97 fL (ref 78.0–100.0)
PLATELETS: 134 10*3/uL — AB (ref 150–400)
RBC: 3.38 MIL/uL — ABNORMAL LOW (ref 3.87–5.11)
RDW: 14.4 % (ref 11.5–15.5)
WBC: 3.5 10*3/uL — AB (ref 4.0–10.5)

## 2017-03-12 LAB — MAGNESIUM: Magnesium: 1.7 mg/dL (ref 1.7–2.4)

## 2017-03-12 MED ORDER — POTASSIUM CHLORIDE CRYS ER 20 MEQ PO TBCR
40.0000 meq | EXTENDED_RELEASE_TABLET | Freq: Two times a day (BID) | ORAL | Status: AC
  Administered 2017-03-12 (×2): 40 meq via ORAL
  Filled 2017-03-12 (×2): qty 2

## 2017-03-12 MED ORDER — ZOLEDRONIC ACID 4 MG/5ML IV CONC
2.0000 mg | Freq: Once | INTRAVENOUS | Status: AC
  Administered 2017-03-12: 2 mg via INTRAVENOUS
  Filled 2017-03-12: qty 2.5

## 2017-03-12 MED ORDER — CALCITONIN (SALMON) 200 UNIT/ML IJ SOLN
4.0000 [IU]/kg | Freq: Two times a day (BID) | INTRAMUSCULAR | Status: DC
  Administered 2017-03-12 – 2017-03-13 (×3): 298 [IU] via SUBCUTANEOUS
  Filled 2017-03-12 (×4): qty 1.49

## 2017-03-12 MED ORDER — MAGNESIUM SULFATE 2 GM/50ML IV SOLN
2.0000 g | Freq: Once | INTRAVENOUS | Status: AC
  Administered 2017-03-12: 2 g via INTRAVENOUS
  Filled 2017-03-12: qty 50

## 2017-03-12 NOTE — Progress Notes (Signed)
PROGRESS NOTE    Joann Hunter  QQI:[id number]  DOB: Oct 30, 1932  DOA: 03/11/2017 PCP: Monico Blitz, MD   Brief Admission Hx: Joann Hunter is a 84 y.o. female with urothelial cancer who has not been treated in the last month due to ongoing medical illnesses and deconditioning was recently discharged from SNF rehab 03/10/17 and had been at home. She continues to be weak and had diminished appetite and oral intake over the last couple of days according to family members.  She had not been eating and drinking very well at the SNF facility.  She apparently had a episode of syncope when she was in the bathroom with her daughter assisting her she apparently went to the bathroom and had a brief moment of silence, and then became less responsive and passed out.    MDM/Assessment & Plan:    1. Orthostatic hypotension - The patient is dehydrated. Will hold torsemide at this time until hydration has improved.  I would like to see a urinalysis and I am waiting for this to be done. Urinalysis no infection seen.    2. Vasovagal syncope - I suspect this was exacerbated by her dehydration and vagal stimulation while going to bathroom. Will initiate fall precautions and treating dehydration with IVFs.  Check carotids, EEG for completeness.  3. Moderate to severe dehydration - Gentle IVFs ordered in setting of cardiomyopathy.  4. Urinary retention - will need to place foley catheter.  Will try flomax.   5. Acute on chronic CKD stage 4 - Renally dose meds and hydrate with IVFs, follow renal function panel.  6. Ischemic cardiomyopathy - gently hydrate and monitor clinically.  The patient had a recent echo in Jan 2019 that showed preserved EF.   I am recommending that torsemide be held at this time. 7. Hypercalcemia of malignancy- likely secondary to malignancy, corrected calcium is 12.5, will treat with IV zometa reduced dose over 60 mins and start subcutaneous calcitonin.  Will follow BMP.  Oncology consult in  AM.  8. Essential hypertension - resume home blood pressure lowering medications and follow clinically and adjust as needed.  9. Hypothyroidism - TSH within normal limits.  10. Thrombocytopenia - likely multifactorial cause in setting of ongoing cancer, will follow closely.  11. Hypokalemia - check magnesium and continue replacement in IVFs. Recheck renal function panel and magnesium in AM.   12. Urothelial cancer - the patient has not been able to continue chemotherapy in the last month due to deconditioning and declining rapidly over past 30 days.  Pt and family hopeful to continue chemo treatments soon as patient recovers.  With patient having hypercalcemia of malignancy this is associated with higher mortality, will ask for palliative medicine team to consult to readdress goals of care.  Will consult the oncology service on Monday.   13. BPPV Vertigo - Family says that she never received the vestibular rehab that she needed and was recommended at discharge.  Apparently the facility made her an outpatient appointment but it is 3 months out in June. will ask PT  to see her in consultation in the hospital for some vestibular rehab. .   14. Dyslipidemia - resume home statin medication.  15. CAD - stable, resume home cardiac medications, except torsemide, and follow clinically. Troponin testing ordered.  DVT Prophylaxis: lovenox Code Status: Full   Family Communication: daughters at bedside  Disposition Plan: When medically improved, PT eval pending, daughters requesting SNF placement   Consultants:  oncology  Subjective: Pt has been having confusion and delirium since admission per daughter.  Pt is not urinating on her own.  I/O cath yesterday with 1200 mL urine.    Objective: Vitals:   03/11/17 1623 03/11/17 1624 03/11/17 2300 03/12/17 0500  BP: (!) 100/42  (!) 100/43 (!) 129/45  Pulse: 90  92 73  Resp: 18 18 18 18   Temp:  97.8 F (36.6 C) 98.3 F (36.8 C) 97.8 F (36.6 C)    TempSrc:  Axillary Oral Oral  SpO2: 96% 96% 95% 95%  Weight: 75.1 kg (165 lb 9.1 oz)   74.6 kg (164 lb 7.4 oz)  Height: 5\' 2"  (1.575 m)       Intake/Output Summary (Last 24 hours) at 03/12/2017 1139 Last data filed at 03/12/2017 0700 Gross per 24 hour  Intake 2079 ml  Output 1200 ml  Net 879 ml   Filed Weights   03/11/17 1608 03/11/17 1623 03/12/17 0500  Weight: 70.1 kg (154 lb 8.7 oz) 75.1 kg (165 lb 9.1 oz) 74.6 kg (164 lb 7.4 oz)     REVIEW OF SYSTEMS  As per history otherwise all reviewed and reported negative  Exam:  General exam: pt is lethargic, arousable but confused.  Respiratory system:  No increased work of breathing. Cardiovascular system: S1 & S2 heard, RRR. No JVD, murmurs, gallops, clicks or pedal edema. Gastrointestinal system: Abdomen is nondistended, soft and nontender. Normal bowel sounds heard. Central nervous system: Alert and oriented. No focal neurological deficits. Extremities: no CCE.  Data Reviewed: Basic Metabolic Panel: Recent Labs  Lab 03/11/17 1215 03/12/17 0112  NA 134* 136  K 2.7* 3.0*  CL 88* 98*  CO2 33* 28  GLUCOSE 156* 120*  BUN 40* 39*  CREATININE 2.70* 2.29*  CALCIUM 12.2* 11.3*  MG 1.8 1.7   Liver Function Tests: Recent Labs  Lab 03/11/17 1215 03/12/17 0112  AST  --  18  ALT  --  9*  ALKPHOS  --  55  BILITOT  --  0.9  PROT  --  5.4*  ALBUMIN 2.9* 2.5*   No results for input(s): LIPASE, AMYLASE in the last 168 hours. No results for input(s): AMMONIA in the last 168 hours. CBC: Recent Labs  Lab 03/11/17 1215 03/12/17 0112  WBC 4.9 3.5*  NEUTROABS 2.8  --   HGB 12.0 10.7*  HCT 36.3 32.8*  MCV 96.5 97.0  PLT 147* 134*   Cardiac Enzymes: Recent Labs  Lab 03/11/17 1800 03/12/17 0112  TROPONINI <0.03 0.03*   CBG (last 3)  Recent Labs    03/11/17 1251  GLUCAP 141*   No results found for this or any previous visit (from the past 240 hour(s)).   Studies: Dg Chest 1 View  Result Date:  03/11/2017 CLINICAL DATA:  Dizziness. History of diabetes, hypertension, coronary artery disease, MI EXAM: CHEST 1 VIEW COMPARISON:  Chest x-rays dated 02/05/2017 and 04/27/2016 FINDINGS: Heart size is upper normal, stable. Atherosclerotic changes noted at the aortic arch. Right chest wall Port-A-Cath is well positioned with tip at the level of the lower SVC. Lungs are clear.  No pleural effusion or pneumothorax seen. IMPRESSION: 1. No active disease.  No evidence of pneumonia or pulmonary edema. 2. Aortic atherosclerosis. Electronically Signed   By: Franki Cabot M.D.   On: 03/11/2017 13:44   Ct Head Wo Contrast  Result Date: 03/11/2017 CLINICAL DATA:  Syncopal episode today. Weakness. History of renal cancer. EXAM: CT HEAD WITHOUT CONTRAST TECHNIQUE: Contiguous axial images were  obtained from the base of the skull through the vertex without intravenous contrast. COMPARISON:  Head CT dated 02/05/2017 FINDINGS: Brain: Ventricles are stable in size and configuration. No hydrocephalus. Mild chronic small vessel ischemic changes within the periventricular and subcortical white matter regions. No mass, hemorrhage, edema or other evidence of acute parenchymal abnormality. No extra-axial hemorrhage. Vascular: There are chronic calcified atherosclerotic changes of the large vessels at the skull base. No unexpected hyperdense vessel. Skull: Normal. Negative for fracture or focal lesion. Sinuses/Orbits: No acute finding. Other: None. IMPRESSION: 1. No acute findings.  No intracranial mass, hemorrhage or edema. 2. Mild chronic small vessel ischemic changes in the white matter. Electronically Signed   By: Franki Cabot M.D.   On: 03/11/2017 13:47   Scheduled Meds: . aspirin EC  81 mg Oral Daily  . enoxaparin (LOVENOX) injection  30 mg Subcutaneous Q24H  . feeding supplement (ENSURE ENLIVE)  237 mL Oral BID BM  . levothyroxine  112 mcg Oral QAC breakfast  . metoprolol tartrate  12.5 mg Oral BID  . omega-3 acid ethyl  esters  1 g Oral Daily  . pantoprazole  40 mg Oral QHS  . potassium chloride  40 mEq Oral BID  . traZODone  50 mg Oral QHS  . [START ON 03/14/2017] Vitamin D (Ergocalciferol)  50,000 Units Oral Once per day on Tue Thu   Continuous Infusions: . 0.9 % NaCl with KCl 40 mEq / L 65 mL/hr (03/12/17 0919)    Principal Problem:   Acute renal failure superimposed on stage 4 chronic kidney disease (HCC) Active Problems:   Hypercalcemia of malignancy   CAD (coronary artery disease)   Cardiomyopathy, ischemic   Dyslipidemia   Chronic kidney disease (CKD), stage IV (severe) (HCC)   Hyponatremia   Vertigo   Essential hypertension   Hypothyroidism   Urothelial cancer (HCC)   Thrombocytopenia (HCC)   Orthostasis   Dehydration   Hypokalemia   Vasovagal syncope  Time spent:   Irwin Brakeman, MD, FAAFP [facility] Pager [phone]  If 7PM-7AM, please contact night-coverage [email or web address] Password [email or web address] 03/12/2017, 11:39 AM    LOS: 1 day

## 2017-03-13 ENCOUNTER — Inpatient Hospital Stay (HOSPITAL_COMMUNITY): Payer: Medicare Other

## 2017-03-13 ENCOUNTER — Inpatient Hospital Stay (HOSPITAL_COMMUNITY)
Admit: 2017-03-13 | Discharge: 2017-03-13 | Disposition: A | Discharge status: Home / Self Care | Payer: Medicare Other | Attending: Family Medicine | Admitting: Family Medicine

## 2017-03-13 ENCOUNTER — Other Ambulatory Visit (HOSPITAL_COMMUNITY): Payer: Self-pay | Admitting: Internal Medicine

## 2017-03-13 ENCOUNTER — Encounter (HOSPITAL_COMMUNITY): Payer: Self-pay | Admitting: Primary Care

## 2017-03-13 DIAGNOSIS — R55 Syncope and collapse: Secondary | ICD-10-CM | POA: Diagnosis not present

## 2017-03-13 DIAGNOSIS — C689 Malignant neoplasm of urinary organ, unspecified: Secondary | ICD-10-CM | POA: Diagnosis not present

## 2017-03-13 DIAGNOSIS — N179 Acute kidney failure, unspecified: Secondary | ICD-10-CM | POA: Diagnosis not present

## 2017-03-13 DIAGNOSIS — I2510 Atherosclerotic heart disease of native coronary artery without angina pectoris: Secondary | ICD-10-CM | POA: Diagnosis not present

## 2017-03-13 DIAGNOSIS — Z7189 Other specified counseling: Secondary | ICD-10-CM

## 2017-03-13 DIAGNOSIS — N184 Chronic kidney disease, stage 4 (severe): Secondary | ICD-10-CM | POA: Diagnosis not present

## 2017-03-13 DIAGNOSIS — Z515 Encounter for palliative care: Secondary | ICD-10-CM

## 2017-03-13 DIAGNOSIS — E860 Dehydration: Secondary | ICD-10-CM | POA: Diagnosis not present

## 2017-03-13 LAB — COMPREHENSIVE METABOLIC PANEL
ALBUMIN: 2.7 g/dL — AB (ref 3.5–5.0)
ALK PHOS: 51 U/L (ref 38–126)
ALT: 10 U/L — ABNORMAL LOW (ref 14–54)
AST: 21 U/L (ref 15–41)
Anion gap: 10 (ref 5–15)
BILIRUBIN TOTAL: 1 mg/dL (ref 0.3–1.2)
BUN: 31 mg/dL — ABNORMAL HIGH (ref 6–20)
CALCIUM: 10.4 mg/dL — AB (ref 8.9–10.3)
CO2: 26 mmol/L (ref 22–32)
Chloride: 102 mmol/L (ref 101–111)
Creatinine, Ser: 1.79 mg/dL — ABNORMAL HIGH (ref 0.44–1.00)
GFR, EST AFRICAN AMERICAN: 29 mL/min — AB (ref >60)
GFR, EST NON AFRICAN AMERICAN: 25 mL/min — AB (ref >60)
GLUCOSE: 188 mg/dL — AB (ref 65–99)
POTASSIUM: 4.4 mmol/L (ref 3.5–5.1)
Sodium: 138 mmol/L (ref 135–145)
TOTAL PROTEIN: 5.9 g/dL — AB (ref 6.5–8.1)

## 2017-03-13 LAB — CBC
HCT: 34.7 % — ABNORMAL LOW (ref 36.0–46.0)
HEMOGLOBIN: 11.2 g/dL — AB (ref 12.0–15.0)
MCH: 31.6 pg (ref 26.0–34.0)
MCHC: 32.3 g/dL (ref 30.0–36.0)
MCV: 98 fL (ref 78.0–100.0)
PLATELETS: 189 10*3/uL (ref 150–400)
RBC: 3.54 MIL/uL — AB (ref 3.87–5.11)
RDW: 14.1 % (ref 11.5–15.5)
WBC: 7.1 10*3/uL (ref 4.0–10.5)

## 2017-03-13 LAB — MAGNESIUM: MAGNESIUM: 2 mg/dL (ref 1.7–2.4)

## 2017-03-13 MED ORDER — ZOLEDRONIC ACID 4 MG/5ML IV CONC
2.0000 mg | Freq: Once | INTRAVENOUS | Status: AC
  Administered 2017-03-13: 2 mg via INTRAVENOUS
  Filled 2017-03-13: qty 2.5

## 2017-03-13 MED ORDER — TAMSULOSIN HCL 0.4 MG PO CAPS
0.4000 mg | ORAL_CAPSULE | Freq: Every day | ORAL | Status: DC
  Administered 2017-03-13 – 2017-03-15 (×3): 0.4 mg via ORAL
  Filled 2017-03-13 (×3): qty 1

## 2017-03-13 MED ORDER — HYDROCODONE-ACETAMINOPHEN 5-325 MG PO TABS
0.5000 | ORAL_TABLET | Freq: Four times a day (QID) | ORAL | Status: DC | PRN
  Administered 2017-03-15: 0.5 via ORAL
  Filled 2017-03-13: qty 1

## 2017-03-13 MED ORDER — ZOLEDRONIC ACID 4 MG/5ML IV CONC
INTRAVENOUS | Status: AC
  Filled 2017-03-13: qty 5

## 2017-03-13 NOTE — Evaluation (Signed)
Physical Therapy Evaluation Patient Details Name: Joann Hunter MRN: [id number] DOB: 09-Jan-1932 Today's Date: 03/13/2017   History of Present Illness  Joann Hunter is a 84 y.o. female with urothelial cancer who has not been treated in the last month due to ongoing medical illnesses and deconditioning was recently discharged from SNF rehab 03/10/17 and had been at home. She continues to be weak and had diminished appetite and oral intake over the last couple of days according to family members.  She had not been eating and drinking very well at the SNF facility.  She apparently had a episode of syncope when she was in the bathroom with her daughter assisting her she apparently went to the bathroom and had a brief moment of silence, and then became less responsive and passed out.  Her daughter was able to assist her to sitting down and she reports that the patient did have some loss of consciousness.  She did not fall or hit her head.  She quickly regained consciousness but does not have clear memory of event.  EMS was called.  The patient had been weak but had not have any specific symptoms prior to this episode.  There was no perceived seizure activity.  The patient has been dealing with BPPV vertigo for the last several weeks and had been supposed to be getting vestibular rehab and training with PT.  It is uncertain that she had gotten that at her rehab facility.  She denies chest pain and SOB.  She denies nausea and emesis.     Clinical Impression  Patient presents lethargic and limited to sitting up at bedside, unable to attempt sit to stands or transfers due generalized weakness and fatigue.  Patient required 2 person Max/total assist to reposition in bed.  Patient will benefit from continued physical therapy in hospital and recommended venue below to increase strength, balance, endurance for safe ADLs and gait.    Follow Up Recommendations SNF;Supervision/Assistance - 24 hour    Equipment  Recommendations  None recommended by PT    Recommendations for Other Services       Precautions / Restrictions Precautions Precautions: Fall Restrictions Weight Bearing Restrictions: No      Mobility  Bed Mobility Overal bed mobility: Needs Assistance Bed Mobility: Supine to Sit;Sit to Supine     Supine to sit: Max assist Sit to supine: Max assist      Transfers                    Ambulation/Gait                Stairs            Wheelchair Mobility    Modified Rankin (Stroke Patients Only)       Balance Overall balance assessment: Needs assistance Sitting-balance support: No upper extremity supported;Feet supported Sitting balance-Leahy Scale: Fair                                       Pertinent Vitals/Pain Pain Assessment: Faces Faces Pain Scale: Hurts even more Pain Location: BLE with movement Pain Descriptors / Indicators: Grimacing;Guarding;Sharp;Discomfort Pain Intervention(s): Limited activity within patient's tolerance;Monitored during session    [place] expects to be discharged to:: Private residence Living Arrangements: Children Available Help at Discharge: Family Type of Home: House Home Access: Level entry     Home Layout: One level  Home Equipment: [place] - 2 wheels;Bedside commode;Shower seat;Wheelchair - manual;Hospital bed;Grab bars - tub/shower;Walker - 4 wheels      Prior Function Level of Independence: Independent with assistive device(s);Needs assistance   Gait / Transfers Assistance Needed: household ambulation with RW  ADL's / Homemaking Assistance Needed: assisted by family        Hand Dominance   Dominant Hand: Right    Extremity/Trunk Assessment   Upper Extremity Assessment Upper Extremity Assessment: Generalized weakness    Lower Extremity Assessment Lower Extremity Assessment: Generalized weakness    Cervical / Trunk Assessment Cervical / Trunk  Assessment: Normal  Communication   Communication: HOH;Other (comment)(patient lethargic)  Cognition Arousal/Alertness: Awake/alert;Lethargic Behavior During Therapy: WFL for tasks assessed/performed Overall Cognitive Status: Within Functional Limits for tasks assessed                                        General Comments      Exercises     Assessment/Plan    PT Assessment Patient needs continued PT services  PT Problem List Decreased activity tolerance;Decreased balance;Decreased mobility;Decreased strength       PT Treatment Interventions Gait training;Functional mobility training;Therapeutic activities;Therapeutic exercise;Patient/family education    PT Goals (Current goals can be found in the Care Plan section)  Acute Rehab PT Goals Patient Stated Goal: return home PT Goal Formulation: With patient/family Time For Goal Achievement: 03/27/17 Potential to Achieve Goals: Fair    Frequency Min 6X/week   Barriers to discharge        Co-evaluation               AM-PAC PT "6 Clicks" Daily Activity  Outcome Measure Difficulty turning over in bed (including adjusting bedclothes, sheets and blankets)?: A Lot Difficulty moving from lying on back to sitting on the side of the bed? : A Lot Difficulty sitting down on and standing up from a chair with arms (e.g., wheelchair, bedside commode, etc,.)?: Unable Help needed moving to and from a bed to chair (including a wheelchair)?: Total Help needed walking in hospital room?: Total Help needed climbing 3-5 steps with a railing? : Total 6 Click Score: 8    End of Session   Activity Tolerance: Patient limited by fatigue;Patient limited by lethargy Patient left: in bed;with call bell/phone within reach;with family/visitor present Nurse Communication: Mobility status PT Visit Diagnosis: Unsteadiness on feet (R26.81);Other abnormalities of gait and mobility (R26.89);Muscle weakness (generalized) (M62.81)     Time: 2197-5883 PT Time Calculation (min) (ACUTE ONLY): 23 min   Charges:   PT Evaluation $PT Eval Moderate Complexity: 1 Mod PT Treatments $Therapeutic Activity: 23-37 mins   PT G Codes:        2:38 PM, Apr 03, 2017 Lonell Grandchild, MPT Physical Therapist with [facility] [address] [phone] office [phone] mobile phone

## 2017-03-13 NOTE — Progress Notes (Signed)
PROGRESS NOTE  Joann Hunter  RKY:[id number]  DOB: 25-Jan-1932  DOA: 03/11/2017 PCP: Monico Blitz, MD   Brief Admission Hx: Joann Hunter is a 84 y.o. female with urothelial cancer who has not been treated in the last month due to ongoing medical illnesses and deconditioning was recently discharged from SNF rehab 03/10/17 and had been at home. She continues to be weak and had diminished appetite and oral intake over the last couple of days according to family members.  She had not been eating and drinking very well at the SNF facility.  She apparently had a episode of syncope when she was in the bathroom with her daughter assisting her she apparently went to the bathroom and had a brief moment of silence, and then became less responsive and passed out.    MDM/Assessment & Plan:   1. Orthostatic hypotension - The patient is dehydrated. Will hold torsemide at this time until hydration has improved.  Urinalysis no infection seen.    2. Vasovagal syncope - I suspect this was exacerbated by her dehydration and vagal stimulation while going to bathroom. Will initiate fall precautions and treated dehydration with IVFs.  Check carotids, EEG for completeness.  3. Moderate to severe dehydration - Treated with gentle IVFs ordered in setting of cardiomyopathy and holding torsemide.  4. Urinary retention - will need to place foley catheter.  Will try flomax.   5. Acute on chronic CKD stage 4 - Renally dose meds and hydrated with IVFs, follow renal function panel. Creatinine improving.  6. Ischemic cardiomyopathy - gently hydrated and monitored clinically.  The patient had a recent echo in Jan 2019 that showed preserved EF.   I am temporarily holding torsemide at this time. Hopefully could restart in the next 1-2 days.  7. Hypercalcemia of malignancy- likely secondary to malignancy, corrected calcium is 12.5, treated with IV zometa and started subcutaneous calcitonin.  Will follow BMP.  Oncology consult  pending.  8. Essential hypertension - resume home blood pressure lowering medications and follow clinically and adjust as needed.  9. Hypothyroidism - TSH within normal limits.  10. Thrombocytopenia - likely multifactorial cause in setting of ongoing cancer, will follow closely. DC lovenox. SCDs.  11. Hypokalemia - check magnesium and continue replacement in IVFs. Recheck renal function panel and magnesium in AM.   12. Urothelial cancer - the patient has not been able to continue chemotherapy in the last month due to deconditioning and declining rapidly over past 30 days.  Pt and family hopeful to continue chemo treatments soon as patient recovers.  With patient having hypercalcemia of malignancy this is associated with higher mortality, will ask for palliative medicine team to consult to readdress goals of care.  Will consult the oncology service.   13. BPPV Vertigo - Family says that she never received the vestibular rehab that she needed and was recommended at discharge.  Apparently the facility made her an outpatient appointment but it is 3 months out in June. will ask PT  to see her in consultation in the hospital for some vestibular rehab. 14. Dyslipidemia - resume home statin medication.  15. CAD - stable, resume home cardiac medications, except torsemide, and follow clinically. Troponin testing ordered. 16. Altered mental status - likely multifactorial in setting of multiple medical problems, CT head was negative for acute findings, will obtain MRI brain to rule out CVA.   DVT Prophylaxis: lovenox Code Status: Full   Family Communication: daughter at bedside  Disposition Plan: When medically  improved, PT eval pending, daughters requesting SNF placement, I have asked for goals of care discussion with palliative medicine team and oncology consult for input regarding fitness for chemo treatments  Consultants:  oncology  Subjective: Pt has been having confusion and delirium since admission  per daughter. Pt has not been complaining about vertigo recently per daughter.    Objective: Vitals:   03/12/17 0500 03/12/17 1500 03/13/17 0400 03/13/17 0800  BP: (!) 129/45 (!) 162/60 (!) 138/92 128/67  Pulse: 73 77 80 84  Resp: 18 16 18 17   Temp: 97.8 F (36.6 C) 99.3 F (37.4 C) 98.4 F (36.9 C) 98.7 F (37.1 C)  TempSrc: Oral Oral Oral Axillary  SpO2: 95% 96% 95% 96%  Weight: 74.6 kg (164 lb 7.4 oz)  76.1 kg (167 lb 12.3 oz)   Height:        Intake/Output Summary (Last 24 hours) at 03/13/2017 1255 Last data filed at 03/13/2017 0900 Gross per 24 hour  Intake 1745 ml  Output 450 ml  Net 1295 ml   Filed Weights   03/11/17 1623 03/12/17 0500 03/13/17 0400  Weight: 75.1 kg (165 lb 9.1 oz) 74.6 kg (164 lb 7.4 oz) 76.1 kg (167 lb 12.3 oz)     REVIEW OF SYSTEMS  As per history otherwise all reviewed and reported negative  Exam:  General exam: pt is lethargic, arousable but confused.  Respiratory system:  No increased work of breathing. Cardiovascular system: S1 & S2 heard, RRR. No JVD, murmurs, gallops, clicks or pedal edema. Gastrointestinal system: Abdomen is nondistended, soft and nontender. Normal bowel sounds heard. Central nervous system: Alert and oriented. No focal neurological deficits. Extremities: no CCE.  Data Reviewed: Basic Metabolic Panel: Recent Labs  Lab 03/11/17 1215 03/12/17 0112 03/13/17 0611  NA 134* 136 138  K 2.7* 3.0* 4.4  CL 88* 98* 102  CO2 33* 28 26  GLUCOSE 156* 120* 188*  BUN 40* 39* 31*  CREATININE 2.70* 2.29* 1.79*  CALCIUM 12.2* 11.3* 10.4*  MG 1.8 1.7 2.0   Liver Function Tests: Recent Labs  Lab 03/11/17 1215 03/12/17 0112 03/13/17 0611  AST  --  18 21  ALT  --  9* 10*  ALKPHOS  --  55 51  BILITOT  --  0.9 1.0  PROT  --  5.4* 5.9*  ALBUMIN 2.9* 2.5* 2.7*   No results for input(s): LIPASE, AMYLASE in the last 168 hours. No results for input(s): AMMONIA in the last 168 hours. CBC: Recent Labs  Lab 03/11/17 1215  03/12/17 0112 03/13/17 0611  WBC 4.9 3.5* 7.1  NEUTROABS 2.8  --   --   HGB 12.0 10.7* 11.2*  HCT 36.3 32.8* 34.7*  MCV 96.5 97.0 98.0  PLT 147* 134* 189   Cardiac Enzymes: Recent Labs  Lab 03/11/17 1800 03/12/17 0112  TROPONINI <0.03 0.03*   CBG (last 3)  Recent Labs    03/11/17 1251  GLUCAP 141*   No results found for this or any previous visit (from the past 240 hour(s)).   Studies: Dg Chest 1 View  Result Date: 03/11/2017 CLINICAL DATA:  Dizziness. History of diabetes, hypertension, coronary artery disease, MI EXAM: CHEST 1 VIEW COMPARISON:  Chest x-rays dated 02/05/2017 and 04/27/2016 FINDINGS: Heart size is upper normal, stable. Atherosclerotic changes noted at the aortic arch. Right chest wall Port-A-Cath is well positioned with tip at the level of the lower SVC. Lungs are clear.  No pleural effusion or pneumothorax seen. IMPRESSION: 1. No active  disease.  No evidence of pneumonia or pulmonary edema. 2. Aortic atherosclerosis. Electronically Signed   By: Franki Cabot M.D.   On: 03/11/2017 13:44   Ct Head Wo Contrast  Result Date: 03/11/2017 CLINICAL DATA:  Syncopal episode today. Weakness. History of renal cancer. EXAM: CT HEAD WITHOUT CONTRAST TECHNIQUE: Contiguous axial images were obtained from the base of the skull through the vertex without intravenous contrast. COMPARISON:  Head CT dated 02/05/2017 FINDINGS: Brain: Ventricles are stable in size and configuration. No hydrocephalus. Mild chronic small vessel ischemic changes within the periventricular and subcortical white matter regions. No mass, hemorrhage, edema or other evidence of acute parenchymal abnormality. No extra-axial hemorrhage. Vascular: There are chronic calcified atherosclerotic changes of the large vessels at the skull base. No unexpected hyperdense vessel. Skull: Normal. Negative for fracture or focal lesion. Sinuses/Orbits: No acute finding. Other: None. IMPRESSION: 1. No acute findings.  No intracranial  mass, hemorrhage or edema. 2. Mild chronic small vessel ischemic changes in the white matter. Electronically Signed   By: Franki Cabot M.D.   On: 03/11/2017 13:47   Mr Brain Wo Contrast  Result Date: 03/13/2017 CLINICAL DATA:  Altered mental status over the last day. EXAM: MRI HEAD WITHOUT CONTRAST TECHNIQUE: Multiplanar, multiecho pulse sequences of the brain and surrounding structures were obtained without intravenous contrast. COMPARISON:  CT 03/11/2017.  MRI 01/29/2017. FINDINGS: Brain: Diffusion imaging does not show any acute or subacute infarction or other cause of restricted diffusion. Old small vessel ischemic changes affect the pons with a small lacunar infarction to the left of midline. Several old small vessel cerebellar infarctions, left more than right. Cerebral hemispheres show moderate chronic small-vessel ischemic changes of the deep and subcortical white matter. Old small bilateral parietal cortical infarctions. No mass lesion, hemorrhage, hydrocephalus or extra-axial collection. Vascular: Abnormal flow left vertebral artery. Right vertebral artery shows flow to the basilar. Both carotid arteries show flow. Skull and upper cervical spine: Negative Sinuses/Orbits: Clear/normal Other: None IMPRESSION: No acute finding or visible change since the study 01/29/2017. Old ischemic changes throughout the brain as outlined above. Chronic abnormal flow in the left vertebral artery. Electronically Signed   By: Nelson Chimes M.D.   On: 03/13/2017 11:39   US Carotid Bilateral  Result Date: 03/12/2017 CLINICAL DATA:  Syncope. Coronary artery disease, hyperlipidemia, diabetes. EXAM: BILATERAL CAROTID DUPLEX ULTRASOUND TECHNIQUE: Pearline Cables scale imaging, color Doppler and duplex ultrasound was performed of bilateral carotid and vertebral arteries in the neck. COMPARISON:  None. TECHNIQUE: Quantification of carotid stenosis is based on velocity parameters that correlate the residual internal carotid diameter  with NASCET-based stenosis levels, using the diameter of the distal internal carotid lumen as the denominator for stenosis measurement. Patient describes technically difficult scan secondary to patient movement and manual interference. The following velocity measurements were obtained: PEAK SYSTOLIC/END DIASTOLIC RIGHT ICA:                     115/17cm/sec CCA:                     25/42HC/WCB SYSTOLIC ICA/CCA RATIO:  1.3 DIASTOLIC ICA/CCA RATIO: 1.6 ECA:                     155cm/sec LEFT ICA:                     183/30cm/sec CCA:  948/54OE/VOJ SYSTOLIC ICA/CCA RATIO:  1.2 DIASTOLIC ICA/CCA RATIO: 2.3 ECA:                     432cm/sec FINDINGS: RIGHT CAROTID ARTERY: Scattered calcified plaque in the common carotid artery without high-grade stenosis. Circumferential eccentric partially calcified plaque in the bulb and ICA origin origin resulting in at least mild stenosis. Normal waveforms and color Doppler signal. Distal ICA mildly tortuous. RIGHT VERTEBRAL ARTERY:  Normal flow direction and waveform. LEFT CAROTID ARTERY: Calcified plaque scattered through the common carotid artery without high-grade stenosis. Eccentric calcified plaque in the carotid bulb extending across the bifurcation. Elevated peak systolic velocities in the proximal external carotid artery with focal aliasing on color Doppler. Otherwise normal waveforms and color Doppler signal. LEFT VERTEBRAL ARTERY: Normal flow direction and waveform. IMPRESSION: 1. Bilateral carotid bifurcation and proximal ICA plaque resulting in less than 50% diameter ICA stenosis. 2. High-grade stenosis at the origin of the left external carotid Artery, of uncertain clinical significance. 3. Antegrade bilateral vertebral arterial flow. Electronically Signed   By: Lucrezia Europe M.D.   On: 03/12/2017 12:24   Scheduled Meds: . aspirin EC  81 mg Oral Daily  . calcitonin  4 Units/kg Subcutaneous BID  . enoxaparin (LOVENOX) injection  30 mg Subcutaneous  Q24H  . feeding supplement (ENSURE ENLIVE)  237 mL Oral BID BM  . levothyroxine  112 mcg Oral QAC breakfast  . metoprolol tartrate  12.5 mg Oral BID  . omega-3 acid ethyl esters  1 g Oral Daily  . pantoprazole  40 mg Oral QHS  . [START ON 03/14/2017] Vitamin D (Ergocalciferol)  50,000 Units Oral Once per day on Tue Thu   Continuous Infusions:   Principal Problem:   Acute renal failure superimposed on stage 4 chronic kidney disease (HCC) Active Problems:   Hypercalcemia of malignancy   CAD (coronary artery disease)   Cardiomyopathy, ischemic   Dyslipidemia   Chronic kidney disease (CKD), stage IV (severe) (HCC)   Hyponatremia   Vertigo   Essential hypertension   Hypothyroidism   Urothelial cancer (HCC)   Thrombocytopenia (HCC)   Orthostasis   Dehydration   Hypokalemia   Vasovagal syncope  Time spent:   Irwin Brakeman, MD, FAAFP [facility] Pager [phone]  If 7PM-7AM, please contact night-coverage [email or web address] Password [email or web address] 03/13/2017, 12:55 PM    LOS: 2 days

## 2017-03-13 NOTE — Procedures (Signed)
HPI:  84 y/o with syncope  TECHNICAL SUMMARY:  A multichannel referential and bipolar montage EEG using the standard international 10-20 system was performed on the patient described as unresponsive to verbal stimuli.  The dominant background activity consists of 4 hertz activity seen most prominantly over the posterior head region.  2-3 Hz activity can be seen in the frontal head regions bilaterally.   Low voltage fast (beta) activity is distributed symmetrically and maximally over the anterior head regions.  ACTIVATION:  Stepwise photic stimulation and hyperventilation is not performed.  EPILEPTIFORM ACTIVITY:  There were no spikes, sharp waves or paroxysmal activity.  SLEEP: No clear sleep is noted.   IMPRESSION:  This is an abnormal EEG demonstrating a moderate diffuse slowing of electrocerebral activity.  This can be seen in a wide variety of encephalopathic state including those of a toxic, metabolic, or degenerative nature.  There were no focal, hemispheric, or lateralizing features.  No epileptiform activity was recorded.  Correlate with clinical examination.

## 2017-03-13 NOTE — Progress Notes (Signed)
Offsite EEG completed at [facility], results pending.

## 2017-03-13 NOTE — Consult Note (Signed)
Consultation Note Date: 03/13/2017   Patient Name: Joann Hunter  DOB: [birth year]  MRN: [id number]  Age / Sex: 84 y.o., female  PCP: Monico Blitz, MD Referring Physician: Murlean Iba, MD  Reason for Consultation: Establishing goals of care and Psychosocial/spiritual support  HPI/Patient Profile: 84 y.o. female  with past medical history of arthritis, chronic back pain, coronary artery disease with N STEMI April 2013, kidney cancer November 2017, chronic kidney disease stage IV, high blood pressure and cholesterol, hypothyroidism, ischemic cardiomyopathy EF of 45 in 2013, type 2 diabetes, history of UTIs, and treated for urothelial cancer, 4 hospital admits and 4 ED visits in 6 months, admitted on 03/11/2017 with orthostatic hypotension, dehydration.   Clinical Assessment and Goals of Care: Mrs. Depriest is resting quietly in bed.  She appears chronically ill.  She does not open her eyes as I call her name.  Present today at bedside is daughter/healthcare power of attorney, Charma Mocarski.  [name] shares that her mother was giving something to help her sleep last night and has been sleepy through the day.  I share that I am with palliative medicine, and [name] states that they are not sure if they need or want palliative medicine.  [name] allows me to explain palliative services.  I share that palliative is an extra layer of support, here to answer questions, help navigate the healthcare system, talk about what is next.  I share the palliative can help with symptom management such as pain, anxiety, breathlessness.  I share that some people feel that palliative is hospice, and I reassured [name] that I am not part of hospice.  I share that palliative medicine keeps the patient at the center of decision making.  I asked [name] if there is any lab or imaging she would like to review.  She states at first no, but then  states that she is waiting on MRI results.  I share with her that I can review these results with her.  I share that MRI report is essentially normal, no new findings.  We review prior findings such as, no difference in ischemic changes.    I again share that palliative medicine is patient centered.  If Mrs. Miyamoto chooses to continue chemotherapy, and it is offered, then she should continue chemotherapy.  [name] is agreeable for me to follow-up tomorrow.   Healthcare power of attorney HCPOA -daughter, Tonnia Bardin his legal healthcare power of attorney.  Paperwork noted in the document tab in epic.   SUMMARY OF RECOMMENDATIONS   At this point, continue full scope treatment. Awaiting oncology recommendations to continue chemotherapy. Continue CODE STATUS/end of life discussions.  Code Status/Advance Care Planning:  Full code  Symptom Management:   Per hospitalist, no additional needs at this time.  Palliative Prophylaxis:   Frequent Pain Assessment and Turn Reposition  Additional Recommendations (Limitations, Scope, Preferences):  Full Scope Treatment  Psycho-social/Spiritual:   Desire for further Chaplaincy support:no  Additional Recommendations: Caregiving  Support/Resources and Education on Hospice  Prognosis:  Unable to determine, based on outcomes.  Based on recommendations from oncology related to chemotherapy administration.  Discharge Planning: To be determined, based on outcomes.  At this point, Mrs. Reaney looks very frail.  PT recommends STR.       Primary Diagnoses: Present on Admission: . Acute renal failure superimposed on stage 4 chronic kidney disease ([place]) . Vasovagal syncope . CAD (coronary artery disease) . Cardiomyopathy, ischemic . Chronic kidney disease (CKD), stage IV (severe) ([place]) . Dehydration . Dyslipidemia . Essential hypertension . Hypothyroidism . Hyponatremia . Thrombocytopenia ([place]) . Urothelial cancer ([place]) . Orthostasis .  Hypokalemia . Hypercalcemia of malignancy   I have reviewed the medical record, interviewed the patient and family, and examined the patient. The following aspects are pertinent.  Past Medical History:  Diagnosis Date  . Arthritis   . Back pain, chronic   . CAD (coronary artery disease)    NSTEMI 04/2011 with newly diagnosed 3V CAD s/p PCI/DES mLAD 04/11/11 with residual dz (per Dr. Burt Knack, should have consideration of PCI of the occluded RCA based on symptoms and/or consideration of outpatient nuclear perfusion testing after the patient recovers from her infarct)  . Cancer ([place])    kidney ( Nov.2017)  . Chronic kidney disease (CKD), stage IV (severe) ([place]) 12/13/2013  . Hyperglycemia   . Hyperlipidemia   . Hypertension   . Hypothyroidism   . Ischemic cardiomyopathy    EF 45% by cath 04/20/11, 50-55% by echo 04/22/11. hypotension limiting medication titration   . Myocardial infarction ([place]) 2013  . Type 2 diabetes with nephropathy ([place]) 07/26/2014  . UTI (urinary tract infection) 12/13/2013  . Vertigo    Social History   Socioeconomic History  . Marital status: Married    Spouse name: None  . Number of children: None  . Years of education: None  . Highest education level: None  Social Needs  . Financial resource strain: None  . Food insecurity - worry: None  . Food insecurity - inability: None  . Transportation needs - medical: None  . Transportation needs - non-medical: None  Occupational History  . None  Tobacco Use  . Smoking status: Never Smoker  . Smokeless tobacco: Never Used  Substance and Sexual Activity  . Alcohol use: No  . Drug use: No  . Sexual activity: No    Birth control/protection: Post-menopausal    Comment: married 39 years-husband at [employer]  Other Topics Concern  . None  Social History Narrative   Lives in [place], [place] with her husband and daughter.   Family History  Problem Relation Age of Onset  . Other Mother   . Cancer Father   . Other  Unknown        no known family cardiac disease  . Other Brother        Diptheria  . Depression Sister   . Pneumonia Sister   . Other Brother        murdered   Scheduled Meds: . aspirin EC  81 mg Oral Daily  . calcitonin  4 Units/kg Subcutaneous BID  . feeding supplement (ENSURE ENLIVE)  237 mL Oral BID BM  . levothyroxine  112 mcg Oral QAC breakfast  . metoprolol tartrate  12.5 mg Oral BID  . omega-3 acid ethyl esters  1 g Oral Daily  . pantoprazole  40 mg Oral QHS  . tamsulosin  0.4 mg Oral QPC supper  . [START ON 03/14/2017] Vitamin D (Ergocalciferol)  50,000 Units Oral Once per day on  Tue Thu   Continuous Infusions: PRN Meds:.acetaminophen **OR** acetaminophen, HYDROcodone-acetaminophen, meclizine, nitroGLYCERIN, ondansetron **OR** ondansetron (ZOFRAN) IV, polyethylene glycol Medications Prior to Admission:  Prior to Admission medications   Medication Sig Start Date End Date Taking? Authorizing Provider  aspirin EC 81 MG tablet Take 81 mg by mouth daily.   Yes [provider]  feeding supplement, ENSURE ENLIVE, (ENSURE ENLIVE) LIQD Take 237 mLs by mouth 2 (two) times daily between meals. 05/02/16  Yes Elgergawy, Silver Huguenin, MD  fish oil-omega-3 fatty acids 1000 MG capsule Take 1 g by mouth daily.   Yes [provider]  fluticasone (FLONASE ALLERGY RELIEF) 50 MCG/ACT nasal spray Place 1 spray into the nose daily as needed for allergies. 01/30/15  Yes [provider]  HYDROcodone-acetaminophen (NORCO/VICODIN) 5-325 MG tablet Take 1 tablet by mouth every 6 (six) hours as needed for moderate pain. 02/08/17  Yes Kathie Dike, MD  levothyroxine (SYNTHROID, LEVOTHROID) 112 MCG tablet Take 1 tablet (112 mcg total) by mouth daily before breakfast. 09/08/16  Yes Holley Bouche, NP  lidocaine-prilocaine (EMLA) cream Apply a quarter size amount to affected area 1 hour prior to coming to chemotherapy.  Do not rub in.  Cover with plastic wrap. 01/09/17  Yes Burns, Wandra Feinstein, NP  LORazepam (ATIVAN) 0.5 MG tablet Take 1 tablet by mouth at bedtime. 05/03/16  Yes [provider]  meclizine (ANTIVERT) 25 MG tablet Take 25 mg by mouth 4 (four) times daily as needed. Dizziness   Yes [provider]  metoprolol tartrate (LOPRESSOR) 25 MG tablet Take 12.5 mg by mouth 2 (two) times daily. 10/24/16  Yes [provider]  nitroGLYCERIN (NITROSTAT) 0.4 MG SL tablet Place 0.4 mg under the tongue every 5 (five) minutes x 3 doses as needed. For chest pain. 04/23/11  Yes Dunn, Dayna N, PA-C  pantoprazole (PROTONIX) 40 MG tablet Take 1 tablet by mouth at bedtime. 09/29/16  Yes [provider]  prochlorperazine (COMPAZINE) 10 MG tablet Take 1 tablet (10 mg total) by mouth every 6 (six) hours as needed for nausea or vomiting. 01/29/16  Yes Penland, Kelby Fam, MD  promethazine (PHENERGAN) 25 MG tablet Take 25 mg by mouth every 6 (six) hours as needed for nausea or vomiting.   Yes [provider]  torsemide (DEMADEX) 20 MG tablet Take 1 tablet (20 mg total) by mouth daily. 02/08/17  Yes Kathie Dike, MD  traZODone (DESYREL) 50 MG tablet Take 1 tablet by mouth at bedtime. 05/31/16  Yes [provider]  Vitamin D, Ergocalciferol, (DRISDOL) 50000 UNITS CAPS Take 50,000 Units by mouth 2 (two) times a week. Tuesdays and Thurdays 05/30/12  Yes [provider]  Gemcitabine HCl (GEMZAR IV) Inject into the vein. Day 1,8,15 every 28 days    [provider]   Allergies  Allergen Reactions  . Ciprofloxacin Other (See Comments)    Hallucination  . Nitrofurantoin Other (See Comments)    Dizziness: Resulting in a fall  . Lipitor [Atorvastatin] Other (See Comments)    Muscle Weakness  . Penicillins Rash    Has patient had a PCN reaction causing immediate rash, facial/tongue/throat swelling, SOB or lightheadedness with hypotension: unknown Has patient had a PCN reaction causing severe rash involving mucus membranes or skin necrosis:  unknown Has patient had a PCN reaction that required hospitalization: unknown Has patient had a PCN reaction occurring within the last 10 years: unknown If all of the above answers are "NO", then may proceed with Cephalosporin use. 2  Review of Systems  Unable to perform ROS: Mental status change    Physical Exam  Constitutional: No distress.  Sleepy, does not  open eyes to voice or touch.  Appears chronically ill.  HENT:  Head: Atraumatic.  Some temporal wasting  Cardiovascular: Normal rate.  Pulmonary/Chest: Effort normal. No respiratory distress.  Abdominal: Soft. She exhibits no distension.  Musculoskeletal: She exhibits no edema.  Neurological:  Sleepy, stirs little to voice  Skin: Skin is warm and dry.  Nursing note and vitals reviewed.   Vital Signs: BP (!) 135/53   Pulse 79   Temp 98.5 F (36.9 C)   Resp 17   Ht 5\' 2"  (1.575 m)   Wt 76.1 kg (167 lb 12.3 oz)   SpO2 96%   BMI 30.69 kg/m  Pain Assessment: No/denies pain   Pain Score: 0-No pain   SpO2: SpO2: 96 % O2 Device:SpO2: 96 % O2 Flow Rate: .   IO: Intake/output summary:   Intake/Output Summary (Last 24 hours) at 03/13/2017 1446 Last data filed at 03/13/2017 1230 Gross per 24 hour  Intake 1065 ml  Output 450 ml  Net 615 ml    LBM: Last BM Date: 03/13/17 Baseline Weight: Weight: 79.4 kg (175 lb) Most recent weight: Weight: 76.1 kg (167 lb 12.3 oz)     Palliative Assessment/Data:   Flowsheet Rows     Most Recent Value  Intake Tab  Referral Department  Hospitalist  Unit at Time of Referral  Med/Surg Unit  Palliative Care Primary Diagnosis  Cancer  Date Notified  03/13/17  Palliative Care Type  New Palliative care  Reason for referral  Clarify Goals of Care  Date of Admission  03/11/17  Date first seen by Palliative Care  03/13/17  # of days Palliative referral response time  0 Day(s)  # of days IP prior to Palliative referral  2  Clinical Assessment  Palliative Performance Scale Score   20%  Pain Max last 24 hours  Not able to report  Pain Min Last 24 hours  Not able to report  Dyspnea Max Last 24 Hours  Not able to report  Dyspnea Min Last 24 hours  Not able to report  Psychosocial & Spiritual Assessment  Palliative Care Outcomes  Patient/Family meeting held?  Yes  Who was at the meeting?  Daughter/HC POA, Melody Haver at bedside  Palliative Care Outcomes  Clarified goals of care, Provided psychosocial or spiritual support      Time In: 1300 Time Out: 1330 Time Total: 30 minutes Greater than 50%  of this time was spent counseling and coordinating care related to the above assessment and plan.  Signed by: Drue Novel, NP   Please contact Palliative Medicine Team phone at [phone] for questions and concerns.  For individual provider: See Shea Evans

## 2017-03-13 NOTE — NC FL2 (Signed)
[place] LEVEL OF CARE SCREENING TOOL     IDENTIFICATION  Patient Name: Joann Hunter Birthdate: 18-Nov-1932 Sex: female Admission Date (Current Location): 03/11/2017  [facility] and [place] Number:  [employer] and Address:  [facility] [address], [place]      Provider Number: [id number]  Attending Physician Name and Address:  Murlean Iba, MD  Relative Name and Phone Number:       Current Level of Care: SNF Recommended Level of Care:   Prior Approval Number:    Date Approved/Denied:   [place] Number: [id number])  Discharge Plan: SNF    Current Diagnoses: Patient Active Problem List   Diagnosis Date Noted  . Acute renal failure superimposed on stage 4 chronic kidney disease ([place]) 03/11/2017  . Vasovagal syncope 03/11/2017  . Hypercalcemia of malignancy 03/11/2017  . AKI (acute kidney injury) ([place]) 01/29/2017  . Pressure injury of skin 01/23/2017  . Pain and swelling of left lower leg 01/21/2017  . Hypokalemia 06/16/2016  . Hip fracture, unspecified laterality, closed, initial encounter ([place]) 04/28/2016  . Ischemic cardiomyopathy 04/28/2016  . Hyperlipidemia 04/28/2016  . Cancer ([place]) 04/28/2016  . Hip fracture ([place]) 04/28/2016  . Dehydration 04/14/2016  . Orthostasis 02/24/2016  . Acute encephalopathy 02/24/2016  . Confusion 02/21/2016  . Neutropenia ([place]) 02/20/2016  . Thrombocytopenia ([place]) 02/20/2016  . ESBL (extended spectrum beta-lactamase) producing bacteria infection 02/20/2016  . Goals of care, counseling/discussion 02/02/2016  . Urothelial cancer ([place]) 01/01/2016  . Rectal bleeding 11/05/2014  . Absolute anemia 11/05/2014  . Hypothyroidism 11/05/2014  . Metabolic encephalopathy 30/16/0109  . Type 2 diabetes with nephropathy ([place]) 07/26/2014  . Essential hypertension 07/26/2014  . Hyponatremia 07/25/2014  . Vertigo 07/25/2014  . Acute kidney injury superimposed on chronic  kidney disease ([place]) 12/13/2013  . Fall 12/13/2013  . Lower urinary tract infectious disease 12/13/2013  . Chronic kidney disease (CKD), stage IV (severe) ([place]) 12/13/2013  . Chest pain at rest 04/26/2011  . Syncope, near 04/26/2011  . CAD (coronary artery disease) 04/23/2011  . Cardiomyopathy, ischemic 04/23/2011  . Dyslipidemia 04/23/2011  . Myocardial infarction, anterior wall, initial care ([place]) 04/20/2011    Orientation RESPIRATION BLADDER Height & Weight        Normal Incontinent Weight: 167 lb 12.3 oz (76.1 kg) Height:  5\' 2"  (157.5 cm)  BEHAVIORAL SYMPTOMS/MOOD NEUROLOGICAL BOWEL NUTRITION STATUS      Incontinent Diet(see discharge summary)  AMBULATORY STATUS COMMUNICATION OF NEEDS Skin   Extensive Assist Verbally Normal                       Personal Care Assistance Level of Assistance  Bathing, Feeding, Dressing   Feeding assistance: Independent Dressing Assistance: Limited assistance     Functional Limitations Info  Sight, Hearing, Speech Sight Info: Adequate Hearing Info: Adequate Speech Info: Adequate    SPECIAL CARE FACTORS FREQUENCY  PT (By licensed PT)     PT Frequency: 5x/week              Contractures Contractures Info: Not present    Additional Factors Info  Code Status, Allergies, Psychotropic Code Status Info: Full Allergies Info: Ciprofloxacin, Nitrofurantoin, Lipitor, Penicillins Psychotropic Info: Ativan, Desyrel         Current Medications (03/13/2017):  This is the current hospital active medication list Current Facility-Administered Medications  Medication Dose Route Frequency Provider Last Rate Last Dose  . acetaminophen (TYLENOL) tablet 650 mg  650  mg Oral Q6H PRN Wynetta Emery, Clanford L, MD   650 mg at 03/12/17 1302   Or  . acetaminophen (TYLENOL) suppository 650 mg  650 mg Rectal Q6H PRN Johnson, Clanford L, MD      . aspirin EC tablet 81 mg  81 mg Oral Daily Johnson, Clanford L, MD   81 mg at 03/12/17 0920  . calcitonin  (MIACALCIN) injection 298 Units  4 Units/kg Subcutaneous BID Murlean Iba, MD   298 Units at 03/13/17 0916  . feeding supplement (ENSURE ENLIVE) (ENSURE ENLIVE) liquid 237 mL  237 mL Oral BID BM Johnson, Clanford L, MD   237 mL at 03/13/17 1456  . HYDROcodone-acetaminophen (NORCO/VICODIN) 5-325 MG per tablet 0.5 tablet  0.5 tablet Oral Q6H PRN Johnson, Clanford L, MD      . levothyroxine (SYNTHROID, LEVOTHROID) tablet 112 mcg  112 mcg Oral QAC breakfast Wynetta Emery, Clanford L, MD   112 mcg at 03/12/17 0920  . meclizine (ANTIVERT) tablet 25 mg  25 mg Oral QID PRN Wynetta Emery, Clanford L, MD   25 mg at 03/12/17 2209  . metoprolol tartrate (LOPRESSOR) tablet 12.5 mg  12.5 mg Oral BID Wynetta Emery, Clanford L, MD   12.5 mg at 03/12/17 2209  . nitroGLYCERIN (NITROSTAT) SL tablet 0.4 mg  0.4 mg Sublingual Q5 Min x 3 PRN Johnson, Clanford L, MD      . omega-3 acid ethyl esters (LOVAZA) capsule 1 g  1 g Oral Daily Johnson, Clanford L, MD   1 g at 03/12/17 0920  . ondansetron (ZOFRAN) tablet 4 mg  4 mg Oral Q6H PRN Johnson, Clanford L, MD       Or  . ondansetron (ZOFRAN) injection 4 mg  4 mg Intravenous Q6H PRN Johnson, Clanford L, MD   4 mg at 03/13/17 0327  . pantoprazole (PROTONIX) EC tablet 40 mg  40 mg Oral QHS Johnson, Clanford L, MD   40 mg at 03/12/17 2209  . polyethylene glycol (MIRALAX / GLYCOLAX) packet 17 g  17 g Oral Daily PRN Wynetta Emery, Clanford L, MD   17 g at 03/12/17 0920  . tamsulosin (FLOMAX) capsule 0.4 mg  0.4 mg Oral QPC supper Wynetta Emery, Clanford L, MD      . Derrill Memo ON 03/14/2017] Vitamin D (Ergocalciferol) (DRISDOL) capsule 50,000 Units  50,000 Units Oral Once per day on Tue Thu Murlean Iba, MD         Discharge Medications: Please see discharge summary for a list of discharge medications.  Relevant Imaging Results:  Relevant Lab Results:   Additional Information SSN [id number], [place], [place]

## 2017-03-13 NOTE — Plan of Care (Signed)
  Acute Rehab PT Goals(only PT should resolve) Pt Will Go Supine/Side To Sit 03/13/2017 1441 - Progressing by Lonell Grandchild, PT Flowsheets Taken 03/13/2017 1441  Pt will go Supine/Side to Sit with minimal assist Patient Will Transfer Sit To/From Stand 03/13/2017 1441 - Progressing by Lonell Grandchild, PT Flowsheets Taken 03/13/2017 1441  Patient will transfer sit to/from stand with minimal assist Pt Will Transfer Bed To Chair/Chair To Bed 03/13/2017 1441 - Progressing by Lonell Grandchild, PT Flowsheets Taken 03/13/2017 1441  Pt will Transfer Bed to Chair/Chair to Bed with min assist Pt Will Ambulate 03/13/2017 1441 - Progressing by Lonell Grandchild, PT Flowsheets Taken 03/13/2017 1441  Pt will Ambulate 25 feet;with rolling walker;with minimal assist  2:41 PM, 03/13/17 Lonell Grandchild, MPT Physical Therapist with [facility] [phone] office [phone] mobile phone

## 2017-03-13 NOTE — Progress Notes (Signed)
Subjective:   Patient sleeping.  Daughter at bedside.  Objective: Temperature 98.7 heart rate 84 respiration 17 blood pressure 128/67 pulse ox 96% on room air  General she is a chronically ill-appearing female in no acute distress HEENT patient is somewhat somnolent Lungs coarse breath sounds Cardiovascular regular rate and rhythm Abdomen soft positive bowel sounds nondistended Extremities show no edema. Neuro she is somnolent.  Labs White count 7.1 hemoglobin 11.2 platelets 189,000.  Creatinine 1.79 calcium is 10.4 AST 21 ALT 10  MRI of the brain done 03/13/2017 shows no acute findings.  She has evidence of chronic ischemia.  CT abdomen and pelvis without contrast on 01/19/2017 showed increase in size of the UPJ tumor.  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  Liver metastasis also was increased in size.  Impression plan:  1. metastatic urothelial carcinoma that was progressive on Tecentriq.  She was followed by Dr. Lebron Conners and was on palliative chemotherapy with Gemzar.  We will check with clinic to determine when her last treatment was received.  I am concerned that in light of the elevated calcium and findings of progressive disease on recent imaging that was done January 19, 2017 she may be having continued progression.  We will continue to monitor her through this hospitalization.  2.  Hypercalcemia.  Recent calcium was elevated at 10.4.  She has received 2 mg of Zometa.  I have discussed case with pharmacy and would like for her to get 4 mg IV of Zometa.  She should also continue IV fluids.  We will check ionized calcium in the a.m.  3.  Somnolence.  She had an MRI and CT of the brain performed that showed no acute findings.  Will determine if lethargy may improve with improvement in her calcium level.  4.  Renal insufficiency.  Creatinine is 1.79.  Continue to monitor chemistries.

## 2017-03-13 NOTE — Clinical Social Work Note (Signed)
Clinical Social Work Assessment  Patient Details  Name: Joann Hunter MRN: [id number] Date of Birth: 1932-04-26  Date of referral:  03/13/17               Reason for consult:  Facility Placement                Permission sought to share information with:    Permission granted to share information::     Name::        Agency::     Relationship::     Contact Information:  Joann Hunter, dtr.   Housing/Transportation Living arrangements for the past 2 months:  [place] of Information:  Adult Children Patient Interpreter Needed:  None Criminal Activity/Legal Involvement Pertinent to Current Situation/Hospitalization:  No - Comment as needed Significant Relationships:  Adult Children Lives with:  Adult Children Do you feel safe going back to the place where you live?  Yes Need for family participation in patient care:  Yes (Comment)  Care giving concerns:  Lives with daughter who provides assistance with care giving needs.    Social Worker assessment / plan:  Patient was discharged on [facility] from [facility] due to her insurance no longer paying for patient to do STR. Family appealed denial and the denial was upheld. Patient is agreeable to STR at SNF. They want to return to [facility] if insurance will auth.  LCSW discussed that patient appears to need long term care at this time. Joann Hunter states that she will need to apply for Medicaid on behalf of client for long term care needs.  Mardene Celeste Mochenick with [facility] states that they are currently unable to make a bed offer.   Employment status:  Retired Nurse, adult PT Recommendations:  [place] / Referral to community resources:  [place]  Patient/Family's Response to care:  Family is agreeable to SNF.   Patient/Family's Understanding of and Emotional Response to Diagnosis, Current Treatment, and Prognosis:  Patient's family understand  patient's diagnosis, treatment and prognosis.   Emotional Assessment Appearance:  Appears stated age Attitude/Demeanor/Rapport:    Affect (typically observed):  Unable to Assess Orientation:    Alcohol / Substance use:  Not Applicable Psych involvement (Current and /or in the community):  No (Comment)  Discharge Needs  Concerns to be addressed:  Discharge Planning Concerns Readmission within the last 30 days:  No Current discharge risk:  Other(Needs long term care) Barriers to Discharge:  No Barriers Identified   Ihor Gully, LCSW 03/13/2017, 4:11 PM

## 2017-03-14 DIAGNOSIS — N179 Acute kidney failure, unspecified: Secondary | ICD-10-CM | POA: Diagnosis not present

## 2017-03-14 DIAGNOSIS — Z7189 Other specified counseling: Secondary | ICD-10-CM

## 2017-03-14 DIAGNOSIS — Z515 Encounter for palliative care: Secondary | ICD-10-CM | POA: Diagnosis not present

## 2017-03-14 LAB — CBC
HEMATOCRIT: 34.3 % — AB (ref 36.0–46.0)
Hemoglobin: 10.8 g/dL — ABNORMAL LOW (ref 12.0–15.0)
MCH: 31.6 pg (ref 26.0–34.0)
MCHC: 31.5 g/dL (ref 30.0–36.0)
MCV: 100.3 fL — ABNORMAL HIGH (ref 78.0–100.0)
PLATELETS: 176 10*3/uL (ref 150–400)
RBC: 3.42 MIL/uL — ABNORMAL LOW (ref 3.87–5.11)
RDW: 14.6 % (ref 11.5–15.5)
WBC: 6.6 10*3/uL (ref 4.0–10.5)

## 2017-03-14 LAB — COMPREHENSIVE METABOLIC PANEL
ALT: 10 U/L — ABNORMAL LOW (ref 14–54)
AST: 19 U/L (ref 15–41)
Albumin: 2.7 g/dL — ABNORMAL LOW (ref 3.5–5.0)
Alkaline Phosphatase: 48 U/L (ref 38–126)
Anion gap: 10 (ref 5–15)
BILIRUBIN TOTAL: 0.9 mg/dL (ref 0.3–1.2)
BUN: 31 mg/dL — ABNORMAL HIGH (ref 6–20)
CHLORIDE: 106 mmol/L (ref 101–111)
CO2: 26 mmol/L (ref 22–32)
Calcium: 10.1 mg/dL (ref 8.9–10.3)
Creatinine, Ser: 1.64 mg/dL — ABNORMAL HIGH (ref 0.44–1.00)
GFR, EST AFRICAN AMERICAN: 33 mL/min — AB (ref >60)
GFR, EST NON AFRICAN AMERICAN: 28 mL/min — AB (ref >60)
Glucose, Bld: 155 mg/dL — ABNORMAL HIGH (ref 65–99)
POTASSIUM: 4.4 mmol/L (ref 3.5–5.1)
Sodium: 142 mmol/L (ref 135–145)
TOTAL PROTEIN: 5.8 g/dL — AB (ref 6.5–8.1)

## 2017-03-14 LAB — MAGNESIUM: MAGNESIUM: 1.8 mg/dL (ref 1.7–2.4)

## 2017-03-14 NOTE — Clinical Social Work Note (Signed)
LCSW spoke with patient's daughter, [name], who was at bedside. LCSW discussed that patient was not a candidate for STR due to the need for long term rehab.  She stated that she had spoken to the attending and felt that hospice may be appropriate. She stated that she would speak with her sister, [name], about making the decision to go with hospice rather than taking patient home.    LCSW signing off.

## 2017-03-14 NOTE — Progress Notes (Signed)
Physical Therapy Treatment Patient Details Name: Joann Hunter MRN: [id number] DOB: 06-15-1932 Today's Date: 03/14/2017    History of Present Illness Joann Hunter is a 84 y.o. female with urothelial cancer who has not been treated in the last month due to ongoing medical illnesses and deconditioning was recently discharged from SNF rehab 03/10/17 and had been at home. She continues to be weak and had diminished appetite and oral intake over the last couple of days according to family members.  She had not been eating and drinking very well at the SNF facility.  She apparently had a episode of syncope when she was in the bathroom with her daughter assisting her she apparently went to the bathroom and had a brief moment of silence, and then became less responsive and passed out.  Her daughter was able to assist her to sitting down and she reports that the patient did have some loss of consciousness.  She did not fall or hit her head.  She quickly regained consciousness but does not have clear memory of event.  EMS was called.  The patient had been weak but had not have any specific symptoms prior to this episode.  There was no perceived seizure activity.  The patient has been dealing with BPPV vertigo for the last several weeks and had been supposed to be getting vestibular rehab and training with PT.  It is uncertain that she had gotten that at her rehab facility.  She denies chest pain and SOB.  She denies nausea and emesis.     PT Comments    Patient presents more alert and tolerated sitting up at bedside and stood briefly due to c/o fatigue and anxiety.  Patient required active assistance to complete LAQ's while seated at bedside, stood and took 1 side step before requesting to go back to bed secondary to not feeling well and chills - RN notified.  Patient will benefit from continued physical therapy in hospital and recommended venue below to increase strength, balance, endurance for safe ADLs and  gait.    Follow Up Recommendations  SNF;Supervision/Assistance - 24 hour     Equipment Recommendations  None recommended by PT    Recommendations for Other Services       Precautions / Restrictions Precautions Precautions: Fall Restrictions Weight Bearing Restrictions: No    Mobility  Bed Mobility Overal bed mobility: Needs Assistance Bed Mobility: Supine to Sit;Sit to Supine     Supine to sit: Max assist Sit to supine: Max assist      Transfers Overall transfer level: Needs assistance Equipment used: Rolling walker (2 wheeled) Transfers: Sit to/from Stand Sit to Stand: Max assist;Mod assist         General transfer comment: limited for standing for up to 20 seconds  Ambulation/Gait Ambulation/Gait assistance: Max assist Ambulation Distance (Feet): 1 Feet Assistive device: Rolling walker (2 wheeled)     Gait velocity interpretation: Below normal speed for age/gender General Gait Details: limited to 1 side step due to weakness, c/o fatigue   Stairs            Wheelchair Mobility    Modified Rankin (Stroke Patients Only)       Balance Overall balance assessment: Needs assistance Sitting-balance support: Bilateral upper extremity supported;Feet supported Sitting balance-Leahy Scale: Fair     Standing balance support: Bilateral upper extremity supported;During functional activity Standing balance-Leahy Scale: Poor Standing balance comment: limited for standing due to weakness, fatigue  Cognition Arousal/Alertness: Awake/alert Behavior During Therapy: WFL for tasks assessed/performed Overall Cognitive Status: Within Functional Limits for tasks assessed                                        Exercises General Exercises - Lower Extremity Ankle Circles/Pumps: Supine;AROM;Strengthening;Both;10 reps Long Arc Quad: Seated;AAROM;Strengthening;Both;10 reps    General Comments         Pertinent Vitals/Pain Pain Assessment: No/denies pain    Home Living                      Prior Function            PT Goals (current goals can now be found in the care plan section) Acute Rehab PT Goals Patient Stated Goal: return home PT Goal Formulation: With patient Time For Goal Achievement: 03/27/17 Potential to Achieve Goals: Fair Progress towards PT goals: Progressing toward goals    Frequency    Min 6X/week      PT Plan Current plan remains appropriate    Co-evaluation              AM-PAC PT "6 Clicks" Daily Activity  Outcome Measure  Difficulty turning over in bed (including adjusting bedclothes, sheets and blankets)?: A Lot Difficulty moving from lying on back to sitting on the side of the bed? : A Lot Difficulty sitting down on and standing up from a chair with arms (e.g., wheelchair, bedside commode, etc,.)?: A Lot Help needed moving to and from a bed to chair (including a wheelchair)?: Total Help needed walking in hospital room?: Total Help needed climbing 3-5 steps with a railing? : Total 6 Click Score: 9    End of Session   Activity Tolerance: Patient limited by fatigue Patient left: in bed;with call bell/phone within reach;with bed alarm set Nurse Communication: Mobility status;Other (comment)(RN notified patient tolerated treatment poorly due to c/o chills and fatigue) PT Visit Diagnosis: Unsteadiness on feet (R26.81);Other abnormalities of gait and mobility (R26.89);Muscle weakness (generalized) (M62.81)     Time: 0037-0488 PT Time Calculation (min) (ACUTE ONLY): 23 min  Charges:  $Therapeutic Activity: 23-37 mins                    G Codes:       10:35 AM, Mar 25, 2017 Lonell Grandchild, MPT Physical Therapist with [facility] [phone] office [phone] mobile phone

## 2017-03-14 NOTE — Progress Notes (Signed)
PROGRESS NOTE  Joann Hunter  WIO:[id number]  DOB: [birth year]  DOA: 03/11/2017 PCP: Monico Blitz, MD   Brief Admission Hx: Joann Hunter is a 84 y.o. female with urothelial cancer who has not been treated in the last month due to ongoing medical illnesses and deconditioning was recently discharged from SNF rehab 03/10/17 and had been at home. She continues to be weak and had diminished appetite and oral intake over the last couple of days according to family members.  She had not been eating and drinking very well at the SNF facility.  She apparently had a episode of syncope when she was in the bathroom with her daughter assisting her she apparently went to the bathroom and had a brief moment of silence, and then became less responsive and passed out.    MDM/Assessment & Plan:   1. Orthostatic hypotension - The patient is dehydrated. Will hold torsemide at this time until hydration has improved.  Urinalysis no infection seen.    2. Vasovagal syncope - I suspect this was exacerbated by her dehydration and vagal stimulation while going to bathroom. Will initiate fall precautions and treated dehydration with IVFs.    Carotids ultrasound and EEG were unrevealing. 3. Moderate to severe dehydration - Treated with gentle IVFs ordered in setting of cardiomyopathy and holding torsemide.  4. Urinary retention -Foley catheter placed on 3/10.  Currently on Flomax..   5. Acute on chronic CKD stage 4 - Renally dose meds and hydrated with IVFs, follow renal function panel. Creatinine improving.  6. Ischemic cardiomyopathy - gently hydrated and monitored clinically.  The patient had a recent echo in Jan 2019 that showed preserved EF.  Diuretics are currently on hold as renal function recovers.  7. Hypercalcemia of malignancy- likely secondary to malignancy, treated with bisphosphonates and IV fluids.  Calcium has since trended down. 8. Essential hypertension - resume home blood pressure lowering medications  and follow clinically and adjust as needed.  9. Hypothyroidism - TSH within normal limits.  10. Thrombocytopenia - likely multifactorial cause in setting of ongoing cancer, will follow closely. DC lovenox. SCDs.  Corrected 11. Hypokalemia -corrected.  Magnesium normal.  12. Urothelial cancer - the patient has not been able to continue chemotherapy in the last month due to deconditioning and declining rapidly over past 30 days.  Discussed with daughter, Joann Hunter, at the bedside today.  Family is leaning towards not pursuing further chemotherapy.  Her cancer on imaging has progressed despite receiving therapy.   13. BPPV Vertigo - Family says that she never received the vestibular rehab that she needed and was recommended at discharge.  Apparently the facility made her an outpatient appointment but it is 3 months out in June. will ask PT  to see her in consultation in the hospital for some vestibular rehab. 14. Dyslipidemia - resume home statin medication.  15. CAD - stable, resume home cardiac medications, except torsemide, and follow clinically. Troponin testing ordered. 16. Altered mental status - likely due to hypercalcemia and dehydration.  17. Failure to thrive.  I had an extensive discussion with the patient's daughter, Joann Hunter at the bedside.  We discussed the patient's progression of illness, poor p.o. intake and overall failure to thrive.  We also discussed the progression of her cancer and recent imaging, despite therapy.  W reports that family is leaning towards not pursuing any further chemotherapy.  Patient has had poor p.o. intake and will likely become dehydrated once again after leaving the hospital.  Her prognosis  is poor.  She would appear to be a good candidate for hospice.  They will discuss this with her sister, Joann Hunter who is the patient's daughter and power of attorney.  Joann Hunter has been had a difficult time coming to terms with the patient's prognosis.  We will try and set up a family  meeting tomorrow to discuss further care.  DVT Prophylaxis: lovenox Code Status: Full   Family Communication: daughter at bedside  Disposition Plan:  Patient does not have insurance approval to go to skilled nursing facility for short-term rehab.  Family weighing options regarding further disposition  Consultants:  oncology  Subjective: Patient sleeping on my arrival.  Her daughter reports that patient was thirsty earlier today.  Her p.o. intake has been poor.  Objective: Vitals:   03/13/17 2045 03/14/17 0500 03/14/17 1100 03/14/17 1200  BP:  (!) 173/63 115/66 (!) 174/64  Hunter: 94 95 73 69  Resp:  18 17 18   Temp:  98.4 F (36.9 C) 97.8 F (36.6 C) 97.9 F (36.6 C)  TempSrc: Oral Oral Oral   SpO2:  98% 95% 97%  Weight:  76 kg (167 lb 8.8 oz)    Height:        Intake/Output Summary (Last 24 hours) at 03/14/2017 1805 Last data filed at 03/14/2017 1630 Gross per 24 hour  Intake 392.5 ml  Output 975 ml  Net -582.5 ml   Filed Weights   03/12/17 0500 03/13/17 0400 03/14/17 0500  Weight: 74.6 kg (164 lb 7.4 oz) 76.1 kg (167 lb 12.3 oz) 76 kg (167 lb 8.8 oz)     REVIEW OF SYSTEMS  As per history otherwise all reviewed and reported negative  Exam:  General exam: Somnolent, not in any distress Respiratory system: Clear to auscultation. Respiratory effort normal. Cardiovascular system:RRR. No murmurs, rubs, gallops. Gastrointestinal system: Abdomen is nondistended, soft and nontender. No organomegaly or masses felt. Normal bowel sounds heard. Central nervous system: No focal neurological deficits. Extremities: No C/C/E, +pedal pulses Skin: No rashes, lesions or ulcers Psychiatry: Lethargic  Data Reviewed: Basic Metabolic Panel: Recent Labs  Lab 03/11/17 1215 03/12/17 0112 03/13/17 0611 03/14/17 0506  NA 134* 136 138 142  K 2.7* 3.0* 4.4 4.4  CL 88* 98* 102 106  CO2 33* 28 26 26   GLUCOSE 156* 120* 188* 155*  BUN 40* 39* 31* 31*  CREATININE 2.70* 2.29* 1.79*  1.64*  CALCIUM 12.2* 11.3* 10.4* 10.1  MG 1.8 1.7 2.0 1.8   Liver Function Tests: Recent Labs  Lab 03/11/17 1215 03/12/17 0112 03/13/17 0611 03/14/17 0506  AST  --  18 21 19   ALT  --  9* 10* 10*  ALKPHOS  --  55 51 48  BILITOT  --  0.9 1.0 0.9  PROT  --  5.4* 5.9* 5.8*  ALBUMIN 2.9* 2.5* 2.7* 2.7*   No results for input(s): LIPASE, AMYLASE in the last 168 hours. No results for input(s): AMMONIA in the last 168 hours. CBC: Recent Labs  Lab 03/11/17 1215 03/12/17 0112 03/13/17 0611 03/14/17 0506  WBC 4.9 3.5* 7.1 6.6  NEUTROABS 2.8  --   --   --   HGB 12.0 10.7* 11.2* 10.8*  HCT 36.3 32.8* 34.7* 34.3*  MCV 96.5 97.0 98.0 100.3*  PLT 147* 134* 189 176   Cardiac Enzymes: Recent Labs  Lab 03/11/17 1800 03/12/17 0112  TROPONINI <0.03 0.03*   CBG (last 3)  No results for input(s): GLUCAP in the last 72 hours. No results found for  this or any previous visit (from the past 240 hour(s)).   Studies: Mr Brain Wo Contrast  Result Date: 03/13/2017 CLINICAL DATA:  Altered mental status over the last day. EXAM: MRI HEAD WITHOUT CONTRAST TECHNIQUE: Multiplanar, multiecho Hunter sequences of the brain and surrounding structures were obtained without intravenous contrast. COMPARISON:  CT 03/11/2017.  MRI 01/29/2017. FINDINGS: Brain: Diffusion imaging does not show any acute or subacute infarction or other cause of restricted diffusion. Old small vessel ischemic changes affect the pons with a small lacunar infarction to the left of midline. Several old small vessel cerebellar infarctions, left more than right. Cerebral hemispheres show moderate chronic small-vessel ischemic changes of the deep and subcortical white matter. Old small bilateral parietal cortical infarctions. No mass lesion, hemorrhage, hydrocephalus or extra-axial collection. Vascular: Abnormal flow left vertebral artery. Right vertebral artery shows flow to the basilar. Both carotid arteries show flow. Skull and upper  cervical spine: Negative Sinuses/Orbits: Clear/normal Other: None IMPRESSION: No acute finding or visible change since the study 01/29/2017. Old ischemic changes throughout the brain as outlined above. Chronic abnormal flow in the left vertebral artery. Electronically Signed   By: Nelson Chimes M.D.   On: 03/13/2017 11:39   Scheduled Meds: . aspirin EC  81 mg Oral Daily  . feeding supplement (ENSURE ENLIVE)  237 mL Oral BID BM  . levothyroxine  112 mcg Oral QAC breakfast  . metoprolol tartrate  12.5 mg Oral BID  . omega-3 acid ethyl esters  1 g Oral Daily  . pantoprazole  40 mg Oral QHS  . tamsulosin  0.4 mg Oral QPC supper  . Vitamin D (Ergocalciferol)  50,000 Units Oral Once per day on Tue Thu   Continuous Infusions:   Principal Problem:   Acute renal failure superimposed on stage 4 chronic kidney disease (HCC) Active Problems:   CAD (coronary artery disease)   Cardiomyopathy, ischemic   Dyslipidemia   Chronic kidney disease (CKD), stage IV (severe) (HCC)   Hyponatremia   Vertigo   Essential hypertension   Hypothyroidism   Urothelial cancer (HCC)   Thrombocytopenia (HCC)   Orthostasis   Dehydration   Hypokalemia   Vasovagal syncope   Hypercalcemia of malignancy   Palliative care by specialist   DNR (do not resuscitate) discussion  Time spent: 76mins  Kathie Dike, MD [facility] Pager [phone]  If 7PM-7AM, please contact night-coverage [email or web address] Password [email or web address] 03/14/2017, 6:05 PM    LOS: 3 days

## 2017-03-14 NOTE — Clinical Social Work Note (Addendum)
Patient declined by [facility] due to insurance not authorizing as patient appears to be long term and not rehab appropriate.   Hadlei Stitt, Clydene Pugh, LCSW

## 2017-03-14 NOTE — Progress Notes (Signed)
Daily Progress Note   Patient Name: Joann Hunter       Date: 03/14/2017 DOB: [birth year]  Age: 84 y.o. MRN#: [id number] Attending Physician: Kathie Dike, MD Primary Care Physician: Monico Blitz, MD Admit Date: 03/11/2017  Reason for Consultation/Follow-up: Establishing goals of care and Psychosocial/spiritual support  Subjective: Joann Hunter is lying quietly in bed.  She does not open her eyes as we are speaking or try to communicate in any meaningful way.  Present today at bedside is hospitalist, Dr. Roderic Palau and daughter Joann Hunter.  We talked about the severity of Joann Hunter illness.  Dr. Roderic Palau reviews recent imaging that show progression of cancer.  Daughter Joann Hunter is accepting, stating that she has seen changes in her mother.  She shares that her sister who is Joann Hunter healthcare power of attorney is having a very difficult time with her mother's decline.   Joann Hunter states that she is experienced with hospice services.  Her first husband passed away with hospice.  She talks about her concern for her father who is a resident of [place] home.  She states her desire is for her family to be together for Joann Hunter at end of life.  I share the booklet "gone from my sight".  Joann Hunter states that she will share this with her sister Joann Hunter.  Joann Hunter states that she will work with her sister for family meeting tomorrow.   Length of Stay: 3  Current Medications: Scheduled Meds:  . aspirin EC  81 mg Oral Daily  . feeding supplement (ENSURE ENLIVE)  237 mL Oral BID BM  . levothyroxine  112 mcg Oral QAC breakfast  . metoprolol tartrate  12.5 mg Oral BID  . omega-3 acid ethyl esters  1 g Oral Daily  . pantoprazole  40 mg Oral QHS  . tamsulosin  0.4 mg Oral QPC supper  .  Vitamin D (Ergocalciferol)  50,000 Units Oral Once per day on Tue Thu    Continuous Infusions:   PRN Meds: acetaminophen **OR** acetaminophen, HYDROcodone-acetaminophen, meclizine, nitroGLYCERIN, ondansetron **OR** ondansetron (ZOFRAN) IV, polyethylene glycol  Physical Exam  Constitutional: No distress.  Appears to be actively dying  HENT:  Head: Atraumatic.  Cardiovascular: Normal rate.  Pulmonary/Chest: Effort normal. No respiratory distress.  Abdominal: Soft. She exhibits no distension.  Musculoskeletal: She exhibits no  edema.  Neurological:  Does not open eyes to voice or touch, no meaningful interaction  Skin: Skin is warm and dry.  Nursing note and vitals reviewed.           Vital Signs: BP (!) 174/64 (BP Location: Left Arm)   Hunter 69   Temp 97.9 F (36.6 C)   Resp 18   Ht 5\' 2"  (1.575 m)   Wt 76 kg (167 lb 8.8 oz)   SpO2 97%   BMI 30.65 kg/m  SpO2: SpO2: 97 % O2 Device: O2 Device: Room Air O2 Flow Rate:    Intake/output summary:   Intake/Output Summary (Last 24 hours) at 03/14/2017 1609 Last data filed at 03/14/2017 1300 Gross per 24 hour  Intake 392.5 ml  Output 725 ml  Net -332.5 ml   LBM: Last BM Date: 03/14/17 Baseline Weight: Weight: 79.4 kg (175 lb) Most recent weight: Weight: 76 kg (167 lb 8.8 oz)       Palliative Assessment/Data:    Flowsheet Rows     Most Recent Value  Intake Tab  Referral Department  Hospitalist  Unit at Time of Referral  Med/Surg Unit  Palliative Care Primary Diagnosis  Cancer  Date Notified  03/13/17  Palliative Care Type  New Palliative care  Reason for referral  Clarify Goals of Care  Date of Admission  03/11/17  Date first seen by Palliative Care  03/13/17  # of days Palliative referral response time  0 Day(s)  # of days IP prior to Palliative referral  2  Clinical Assessment  Palliative Performance Scale Score  20%  Pain Max last 24 hours  Not able to report  Pain Min Last 24 hours  Not able to report    Dyspnea Max Last 24 Hours  Not able to report  Dyspnea Min Last 24 hours  Not able to report  Psychosocial & Spiritual Assessment  Palliative Care Outcomes  Patient/Family meeting held?  Yes  Who was at the meeting?  Daughter/HC POA, Joann Hunter at bedside  Palliative Care Outcomes  Clarified goals of care, Provided psychosocial or spiritual support      Patient Active Problem List   Diagnosis Date Noted  . Palliative care by specialist   . Syncope   . Acute renal failure superimposed on stage 4 chronic kidney disease ([place]) 03/11/2017  . Vasovagal syncope 03/11/2017  . Hypercalcemia of malignancy 03/11/2017  . AKI (acute kidney injury) ([place]) 01/29/2017  . Pressure injury of skin 01/23/2017  . Pain and swelling of left lower leg 01/21/2017  . Hypokalemia 06/16/2016  . Hip fracture, unspecified laterality, closed, initial encounter ([place]) 04/28/2016  . Ischemic cardiomyopathy 04/28/2016  . Hyperlipidemia 04/28/2016  . Cancer ([place]) 04/28/2016  . Hip fracture ([place]) 04/28/2016  . Dehydration 04/14/2016  . Orthostasis 02/24/2016  . Acute encephalopathy 02/24/2016  . Confusion 02/21/2016  . Neutropenia ([place]) 02/20/2016  . Thrombocytopenia ([place]) 02/20/2016  . ESBL (extended spectrum beta-lactamase) producing bacteria infection 02/20/2016  . Goals of care, counseling/discussion 02/02/2016  . Urothelial cancer ([place]) 01/01/2016  . Rectal bleeding 11/05/2014  . Absolute anemia 11/05/2014  . Hypothyroidism 11/05/2014  . Metabolic encephalopathy 23/55/7322  . Type 2 diabetes with nephropathy ([place]) 07/26/2014  . Essential hypertension 07/26/2014  . Hyponatremia 07/25/2014  . Vertigo 07/25/2014  . Acute kidney injury superimposed on chronic kidney disease ([place]) 12/13/2013  . Fall 12/13/2013  . Lower urinary tract infectious disease 12/13/2013  . Chronic kidney disease (CKD), stage IV (severe) ([place]) 12/13/2013  .  Chest pain at rest 04/26/2011  . Syncope, near 04/26/2011  . CAD  (coronary artery disease) 04/23/2011  . Cardiomyopathy, ischemic 04/23/2011  . Dyslipidemia 04/23/2011  . Myocardial infarction, anterior wall, initial care [facility]) 04/20/2011    Palliative Care Assessment & Plan   Patient Profile: 84 y.o. female  with past medical history of arthritis, chronic back pain, coronary artery disease with N STEMI April 2013, kidney cancer November 2017, chronic kidney disease stage IV, high blood pressure and cholesterol, hypothyroidism, ischemic cardiomyopathy EF of 45 in 2013, type 2 diabetes, history of UTIs, and treated for urothelial cancer, 4 hospital admits and 4 ED visits in 6 months, admitted on 03/11/2017 with orthostatic hypotension, dehydration.   Assessment: orthostatic hypotension, dehydration: Fluid resuscitation, but no improvement in mental status.  Appears to be actively dying.   Recommendations/Plan:  Continue CODE STATUS discussion with daughter/HC POA, Joann Hunter.  Daughter Joann Hunter states that she would endorse DNR, but feels she must respect her sister.  Goals of Care and Additional Recommendations:  Limitations on Scope of Treatment: Full Scope Treatment  Code Status:    Code Status Orders  (From admission, onward)        Start     Ordered   03/11/17 1618  Full code  Continuous     03/11/17 1617    Code Status History    Date Active Date Inactive Code Status Order ID Comments User Context   02/05/2017 18:42 02/08/2017 20:50 Full Code [id number]  Bethena Roys, MD Inpatient   01/28/2017 23:24 02/03/2017 15:19 Full Code [id number]  Etta Quill, DO ED   01/21/2017 16:43 01/25/2017 16:54 Full Code [id number]  Truett Mainland, DO Inpatient   04/28/2016 02:34 05/02/2016 17:24 Full Code [id number]  Orvan Falconer, MD Inpatient   02/24/2016 17:13 02/25/2016 16:03 Full Code [id number]  Erline Hau, MD Inpatient   02/20/2016 21:38 02/22/2016 20:09 Full Code [id number]  Truett Mainland, DO Inpatient   09/04/2015 20:34 09/05/2015 16:48 Full Code  [id number]  Truett Mainland, DO Inpatient   11/05/2014 17:35 11/06/2014 21:54 Full Code [id number]  Kathie Dike, MD Inpatient   09/14/2014 17:37 09/18/2014 20:18 Full Code [id number]  Doree Albee, MD ED   07/25/2014 21:12 08/02/2014 14:44 Full Code [id number]  Truett Mainland, DO Inpatient   12/13/2013 15:00 12/16/2013 18:05 Full Code [id number]  Verlee Monte, MD ED    Advance Directive Documentation     Most Recent Value  Type of Advance Directive  Healthcare Power of Attorney  Pre-existing out of facility DNR order (yellow form or pink MOST form)  No data  "MOST" Form in Place?  No data       Prognosis:  Hours - Days, appears to be actively dying  Discharge Planning:  Anticipated Hospital Death  Care plan was discussed with nursing staff, and Dr. Eber Jones  Thank you for allowing the Palliative Medicine Team to assist in the care of this patient.   Time In:  1400 Time Out:  1435 Total Time  35 minutes Prolonged Time Billed  no       Greater than 50%  of this time was spent counseling and coordinating care related to the above assessment and plan.  Drue Novel, NP  Please contact Palliative Medicine Team phone at [phone] for questions and concerns.

## 2017-03-15 DIAGNOSIS — E039 Hypothyroidism, unspecified: Secondary | ICD-10-CM | POA: Diagnosis not present

## 2017-03-15 DIAGNOSIS — D696 Thrombocytopenia, unspecified: Secondary | ICD-10-CM | POA: Diagnosis not present

## 2017-03-15 DIAGNOSIS — R55 Syncope and collapse: Secondary | ICD-10-CM | POA: Diagnosis not present

## 2017-03-15 DIAGNOSIS — Z515 Encounter for palliative care: Secondary | ICD-10-CM | POA: Diagnosis not present

## 2017-03-15 DIAGNOSIS — C689 Malignant neoplasm of urinary organ, unspecified: Secondary | ICD-10-CM | POA: Diagnosis not present

## 2017-03-15 DIAGNOSIS — N184 Chronic kidney disease, stage 4 (severe): Secondary | ICD-10-CM | POA: Diagnosis not present

## 2017-03-15 DIAGNOSIS — E785 Hyperlipidemia, unspecified: Secondary | ICD-10-CM | POA: Diagnosis not present

## 2017-03-15 DIAGNOSIS — N179 Acute kidney failure, unspecified: Secondary | ICD-10-CM | POA: Diagnosis not present

## 2017-03-15 DIAGNOSIS — E860 Dehydration: Secondary | ICD-10-CM | POA: Diagnosis not present

## 2017-03-15 DIAGNOSIS — I255 Ischemic cardiomyopathy: Secondary | ICD-10-CM | POA: Diagnosis not present

## 2017-03-15 DIAGNOSIS — I10 Essential (primary) hypertension: Secondary | ICD-10-CM | POA: Diagnosis not present

## 2017-03-15 LAB — COMPREHENSIVE METABOLIC PANEL
ALBUMIN: 2.4 g/dL — AB (ref 3.5–5.0)
ALT: 9 U/L — AB (ref 14–54)
AST: 18 U/L (ref 15–41)
Alkaline Phosphatase: 46 U/L (ref 38–126)
Anion gap: 8 (ref 5–15)
BUN: 32 mg/dL — AB (ref 6–20)
CHLORIDE: 105 mmol/L (ref 101–111)
CO2: 27 mmol/L (ref 22–32)
CREATININE: 1.63 mg/dL — AB (ref 0.44–1.00)
Calcium: 9.7 mg/dL (ref 8.9–10.3)
GFR calc Af Amer: 33 mL/min — ABNORMAL LOW (ref >60)
GFR, EST NON AFRICAN AMERICAN: 28 mL/min — AB (ref >60)
Glucose, Bld: 119 mg/dL — ABNORMAL HIGH (ref 65–99)
Potassium: 3.7 mmol/L (ref 3.5–5.1)
Sodium: 140 mmol/L (ref 135–145)
Total Bilirubin: 1 mg/dL (ref 0.3–1.2)
Total Protein: 5.2 g/dL — ABNORMAL LOW (ref 6.5–8.1)

## 2017-03-15 LAB — CBC
HEMATOCRIT: 31.9 % — AB (ref 36.0–46.0)
HEMOGLOBIN: 10.1 g/dL — AB (ref 12.0–15.0)
MCH: 31.9 pg (ref 26.0–34.0)
MCHC: 31.7 g/dL (ref 30.0–36.0)
MCV: 100.6 fL — AB (ref 78.0–100.0)
Platelets: 142 10*3/uL — ABNORMAL LOW (ref 150–400)
RBC: 3.17 MIL/uL — ABNORMAL LOW (ref 3.87–5.11)
RDW: 14.6 % (ref 11.5–15.5)
WBC: 4.3 10*3/uL (ref 4.0–10.5)

## 2017-03-15 LAB — MAGNESIUM: MAGNESIUM: 1.7 mg/dL (ref 1.7–2.4)

## 2017-03-15 NOTE — Progress Notes (Signed)
PROGRESS NOTE  Joann Hunter  ZCH:[id number]  DOB: 04/29/1932  DOA: 03/11/2017 PCP: Monico Blitz, MD   Brief Admission Hx: Joann Hunter is a 84 y.o. female with urothelial cancer who has not been treated in the last month due to ongoing medical illnesses and deconditioning was recently discharged from SNF rehab 03/10/17 and had been at home. She continues to be weak and had diminished appetite and oral intake over the last couple of days according to family members.  She had not been eating and drinking very well at the SNF facility.  She apparently had a episode of syncope when she was in the bathroom with her daughter assisting her she apparently went to the bathroom and had a brief moment of silence, and then became less responsive and passed out.    MDM/Assessment & Plan:   1. Orthostatic hypotension - The patient was dehydrated.  Torsemide currently on hold.  She has been hydrated.  Urinalysis no infection seen.    2. Vasovagal syncope - I suspect this was exacerbated by her dehydration and vagal stimulation while going to bathroom.  Treated with IV fluids.    Carotid ultrasound and EEG were unrevealing. 3. Moderate to severe dehydration - Treated with gentle IVFs ordered in setting of cardiomyopathy and holding torsemide.  Improved 4. Urinary retention -Foley catheter placed on 3/10.  Currently on Flomax..   5. Acute on chronic CKD stage 4 - Renally dose meds and hydrated with IVFs, follow renal function panel. Creatinine has improved and appears to be approaching baseline.  6. Ischemic cardiomyopathy - gently hydrated and monitored clinically.  The patient had a recent echo in Jan 2019 that showed preserved EF.  Diuretics are currently on hold as renal function recovers.  Would avoid resuming diuretics with her decreased p.o. intake and propensity to become dehydrated 7. Hypercalcemia of malignancy- likely secondary to malignancy, treated with bisphosphonates and IV fluids.  Calcium has  since trended down. 8. Essential hypertension - resume home blood pressure lowering medications and follow clinically and adjust as needed.  9. Hypothyroidism - TSH within normal limits.  10. Thrombocytopenia - likely multifactorial cause in setting of ongoing cancer, will follow closely. DC lovenox. SCDs.  Platelet count has now improved 11. Hypokalemia -corrected.  Magnesium normal.  12. Urothelial cancer - the patient has not been able to continue chemotherapy in the last month due to deconditioning and declining rapidly over past 30 days.  Her cancer on imaging has progressed despite receiving therapy.   See further discussion below.  13. BPPV Vertigo -this is a chronic issue.  Home health can be provided for further vestibular therapy.  She is not a candidate for nursing home rehab. 14. Dyslipidemia - resume home statin medication.  15. CAD - stable, resume home cardiac medications, except torsemide, and follow clinically.  16. Altered mental status - likely due to hypercalcemia and dehydration.  Mental status appears to be improving and returning to baseline. 17. Severe protein calorie malnutrition.  Albumin of 2.4.  She has had diminished p.o. intake.  Consult nutrition. 18. Failure to thrive.  Patient has had progressive cancer despite immunotherapy.  She is not been able to tolerate chemotherapy.  Discussed case at length with oncology, Dr. Walden Field who felt that DNR status would be very appropriate as well as hospice.  It does not appear that patient will tolerate further aggressive treatment.  I met with patient's daughter, Joann Hunter who is her power of attorney.  We discussed the  patient's progression of illness, malnutrition, poor functional status, repeated admissions and overall poor prognosis.  I recommended DNR status as well as comfort care/hospice.  He does not agree with these recommendations.  She seems to be focusing on patient's vertigo as the main cause of her recent decline, and not  giving as much importance to her underlying, progressive cancer.  She is refusing hospice services.  She reports that patient had told her that she would want "everything done" and "to be kept alive".  This is contrary to what her other daughter, Joann Hunter had mentioned yesterday, saying that patient reported that "she was ready to go".  Clearly, Joann Hunter is having a very difficult time coming to terms with the patient's mortality.  Patient will likely end up returning home with home health, although she is very high risk for readmission.  DVT Prophylaxis: lovenox Code Status: Full   Family Communication: daughter at bedside  Disposition Plan:  Patient does not have insurance approval to go to skilled nursing facility for short-term rehab.  Daughter who is power of attorney will be taking the patient home with home health services.  Consultants:  Oncology  Palliative care  Subjective: Wakes up to voice.  Says she is thirsty.  Denies any shortness of breath.  Objective: Vitals:   03/14/17 2129 03/15/17 0500 03/15/17 1500 03/15/17 1545  BP: (!) 135/49 (!) 143/53 127/74 (!) 123/40  Hunter: 82 71 70 64  Resp: '15 13 16 16  '$ Temp: 98.6 F (37 C) 98.6 F (37 C) 100.1 F (37.8 C) 98.9 F (37.2 C)  TempSrc:   Oral Oral  SpO2: 96% 94% 98% 98%  Weight:  74.6 kg (164 lb 7.4 oz)    Height:        Intake/Output Summary (Last 24 hours) at 03/15/2017 1900 Last data filed at 03/15/2017 1814 Gross per 24 hour  Intake 240 ml  Output 651 ml  Net -411 ml   Filed Weights   03/13/17 0400 03/14/17 0500 03/15/17 0500  Weight: 76.1 kg (167 lb 12.3 oz) 76 kg (167 lb 8.8 oz) 74.6 kg (164 lb 7.4 oz)     REVIEW OF SYSTEMS  As per history otherwise all reviewed and reported negative  Exam:  General exam: Wakes up to voice, no distress Respiratory system: Clear to auscultation. Respiratory effort normal. Cardiovascular system:RRR. No murmurs, rubs, gallops. Gastrointestinal system: Abdomen is  nondistended, soft and nontender. No organomegaly or masses felt. Normal bowel sounds heard. Central nervous system:  No focal neurological deficits. Extremities: No C/C/E, +pedal pulses Skin: No rashes, lesions or ulcers Psychiatry: Confused, somnolent   Data Reviewed: Basic Metabolic Panel: Recent Labs  Lab 03/11/17 1215 03/12/17 0112 03/13/17 0611 03/14/17 0506 03/15/17 0650  NA 134* 136 138 142 140  K 2.7* 3.0* 4.4 4.4 3.7  CL 88* 98* 102 106 105  CO2 33* '28 26 26 27  '$ GLUCOSE 156* 120* 188* 155* 119*  BUN 40* 39* 31* 31* 32*  CREATININE 2.70* 2.29* 1.79* 1.64* 1.63*  CALCIUM 12.2* 11.3* 10.4* 10.1 9.7  MG 1.8 1.7 2.0 1.8 1.7   Liver Function Tests: Recent Labs  Lab 03/11/17 1215 03/12/17 0112 03/13/17 0611 03/14/17 0506 03/15/17 0650  AST  --  '18 21 19 18  '$ ALT  --  9* 10* 10* 9*  ALKPHOS  --  55 51 48 46  BILITOT  --  0.9 1.0 0.9 1.0  PROT  --  5.4* 5.9* 5.8* 5.2*  ALBUMIN 2.9* 2.5* 2.7* 2.7*  2.4*   No results for input(s): LIPASE, AMYLASE in the last 168 hours. No results for input(s): AMMONIA in the last 168 hours. CBC: Recent Labs  Lab 03/11/17 1215 03/12/17 0112 03/13/17 0611 03/14/17 0506 03/15/17 0650  WBC 4.9 3.5* 7.1 6.6 4.3  NEUTROABS 2.8  --   --   --   --   HGB 12.0 10.7* 11.2* 10.8* 10.1*  HCT 36.3 32.8* 34.7* 34.3* 31.9*  MCV 96.5 97.0 98.0 100.3* 100.6*  PLT 147* 134* 189 176 142*   Cardiac Enzymes: Recent Labs  Lab 03/11/17 1800 03/12/17 0112  TROPONINI <0.03 0.03*   CBG (last 3)  No results for input(s): GLUCAP in the last 72 hours. No results found for this or any previous visit (from the past 240 hour(s)).   Studies: No results found. Scheduled Meds: . aspirin EC  81 mg Oral Daily  . feeding supplement (ENSURE ENLIVE)  237 mL Oral BID BM  . levothyroxine  112 mcg Oral QAC breakfast  . metoprolol tartrate  12.5 mg Oral BID  . omega-3 acid ethyl esters  1 g Oral Daily  . pantoprazole  40 mg Oral QHS  . tamsulosin  0.4  mg Oral QPC supper  . Vitamin D (Ergocalciferol)  50,000 Units Oral Once per day on Tue Thu   Continuous Infusions:   Principal Problem:   Acute renal failure superimposed on stage 4 chronic kidney disease (HCC) Active Problems:   CAD (coronary artery disease)   Cardiomyopathy, ischemic   Dyslipidemia   Chronic kidney disease (CKD), stage IV (severe) (HCC)   Hyponatremia   Vertigo   Essential hypertension   Hypothyroidism   Urothelial cancer (HCC)   Thrombocytopenia (HCC)   Orthostasis   Dehydration   Hypokalemia   Acute kidney injury ([place])   Vasovagal syncope   Hypercalcemia of malignancy   Palliative care by specialist   DNR (do not resuscitate) discussion  Time spent: 52mns  JKathie Dike MD [facility] Pager [phone] If 7PM-7AM, please contact night-coverage [email or web address] Password [email or web address] 03/15/2017, 7:00 PM    LOS: 4 days

## 2017-03-15 NOTE — Progress Notes (Signed)
Subjective:   Patient more alert.  Daughter at bedside.  Objective: Temperature 98.9 heart rate 64  respiration 16 blood pressure 123/40  pulse ox 98% on room air  General she is a chronically ill-appearing female in no acute distress HEENT NCAT Lungs coarse breath sounds Cardiovascular regular rate and rhythm Abdomen soft positive bowel sounds nondistended Extremities show no edema. Neuro Pt more alert today.    Labs White count 4.3 hemoglobin 10.1 platelets 143,000.  Creatinine 1.63 calcium is 9.7    MRI of the brain done 03/13/2017 shows no acute findings.  She has evidence of chronic ischemia.  CT abdomen and pelvis without contrast on 01/19/2017 showed increase in size of the UPJ tumor.  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  Liver metastasis also was increased in size.  Impression plan:  1. metastatic urothelial carcinoma that was progressive on Tecentriq.  She was followed by Dr. Lebron Conners and was on palliative chemotherapy with Gemzar.    I have discussed with the daughter today patient has overall poor prognosis related to age, debilitation and poor performance status.  She has been unable to continue therapy with Gemzar as was previously recommended by Dr. Lebron Conners.  I have discussed options of palliative therapy and hospice and the daughter desires to contemplate this information and discuss it with additional family members.  I have discussed with her I am concerned that in light of the elevated calcium and findings of progressive disease on recent imaging that was done January 19, 2017 she may be having continued progression.  Next imaging would be due in April 2019 if they desire outpatient follow-up. Pt has also been unable to continue therapy as previously planned.  We will continue to monitor her through this hospitalization.  2.  Hypercalcemia.    Patient was treated with Zometa.  Calcium is improved at 9.7.  Patient is more alert today.    3.   Somnolence.  She had an MRI and CT of the brain performed that showed no acute findings.  Will determine if lethargy may improve with improvement in her calcium level.  4.  Renal insufficiency.  Creatinine is 1.63.

## 2017-03-15 NOTE — Progress Notes (Signed)
Physical Therapy Treatment Patient Details Name: Joann Hunter MRN: [id number] DOB: 12/31/1932 Today's Date: 03/15/2017    History of Present Illness Joann Hunter is a 84 y.o. female with urothelial cancer who has not been treated in the last month due to ongoing medical illnesses and deconditioning was recently discharged from SNF rehab 03/10/17 and had been at home. She continues to be weak and had diminished appetite and oral intake over the last couple of days according to family members.  She had not been eating and drinking very well at the SNF facility.  She apparently had a episode of syncope when she was in the bathroom with her daughter assisting her she apparently went to the bathroom and had a brief moment of silence, and then became less responsive and passed out.  Her daughter was able to assist her to sitting down and she reports that the patient did have some loss of consciousness.  She did not fall or hit her head.  She quickly regained consciousness but does not have clear memory of event.  EMS was called.  The patient had been weak but had not have any specific symptoms prior to this episode.  There was no perceived seizure activity.  The patient has been dealing with BPPV vertigo for the last several weeks and had been supposed to be getting vestibular rehab and training with PT.  It is uncertain that she had gotten that at her rehab facility.  She denies chest pain and SOB.  She denies nausea and emesis.     PT Comments    Patient presents with family members in room, demonstrates increased BUE strength for sitting up at bedside, limited to lifting BLE up to 50% of available ROM when completing exercises seated at bedside due to weakness, tolerated standing and taking 1 side step before requesting to lie down due to fatigue and c/o chest pain.  Patient put back to bed and RN notified concerning chest pain.  Patient will benefit from continued physical therapy in hospital and  recommended venue below to increase strength, balance, endurance for safe ADLs and gait.   Follow Up Recommendations  SNF;Supervision/Assistance - 24 hour     Equipment Recommendations  None recommended by PT    Recommendations for Other Services       Precautions / Restrictions Precautions Precautions: Fall Restrictions Weight Bearing Restrictions: No    Mobility  Bed Mobility Overal bed mobility: Needs Assistance Bed Mobility: Supine to Sit;Sit to Supine     Supine to sit: Min assist Sit to supine: Mod assist      Transfers Overall transfer level: Needs assistance Equipment used: Rolling walker (2 wheeled) Transfers: Sit to/from Stand Sit to Stand: Mod assist            Ambulation/Gait Ambulation/Gait assistance: Max assist Ambulation Distance (Feet): 1 Feet Assistive device: Rolling walker (2 wheeled)     Gait velocity interpretation: Below normal speed for age/gender General Gait Details: limited to 1 side step due to weakness, c/o fatigue and chest pain   Stairs            Wheelchair Mobility    Modified Rankin (Stroke Patients Only)       Balance Overall balance assessment: Needs assistance Sitting-balance support: Feet supported;No upper extremity supported Sitting balance-Leahy Scale: Good     Standing balance support: Bilateral upper extremity supported;During functional activity Standing balance-Leahy Scale: Poor Standing balance comment: with RW  Cognition Arousal/Alertness: Awake/alert Behavior During Therapy: WFL for tasks assessed/performed Overall Cognitive Status: Within Functional Limits for tasks assessed                                        Exercises General Exercises - Lower Extremity Ankle Circles/Pumps: Seated;AROM;Strengthening;Both;10 reps Long Arc Quad: Seated;AROM;Strengthening;Both;10 reps Hip Flexion/Marching: Seated;AROM;Strengthening;Both;10  reps    General Comments        Pertinent Vitals/Pain Pain Assessment: Faces Faces Pain Scale: Hurts little more Pain Location: chest pain Pain Descriptors / Indicators: Discomfort;Grimacing;Sharp Pain Intervention(s): Limited activity within patient's tolerance;Monitored during session;Patient requesting pain meds-RN notified    Home Living                      Prior Function            PT Goals (current goals can now be found in the care plan section) Acute Rehab PT Goals Patient Stated Goal: return home PT Goal Formulation: With patient Time For Goal Achievement: 03/27/17 Potential to Achieve Goals: Fair Progress towards PT goals: Progressing toward goals    Frequency    Min 6X/week      PT Plan Current plan remains appropriate    Co-evaluation              AM-PAC PT "6 Clicks" Daily Activity  Outcome Measure  Difficulty turning over in bed (including adjusting bedclothes, sheets and blankets)?: A Lot Difficulty moving from lying on back to sitting on the side of the bed? : A Lot Difficulty sitting down on and standing up from a chair with arms (e.g., wheelchair, bedside commode, etc,.)?: A Lot Help needed moving to and from a bed to chair (including a wheelchair)?: Total Help needed walking in hospital room?: Total Help needed climbing 3-5 steps with a railing? : Total 6 Click Score: 9    End of Session   Activity Tolerance: Patient limited by fatigue;Patient limited by pain Patient left: in bed;with call bell/phone within reach;with bed alarm set;with family/visitor present Nurse Communication: Mobility status;Other (comment)(RN notified that patient c/o chest pain) PT Visit Diagnosis: Unsteadiness on feet (R26.81);Other abnormalities of gait and mobility (R26.89);Muscle weakness (generalized) (M62.81)     Time: 4270-6237 PT Time Calculation (min) (ACUTE ONLY): 25 min  Charges:  $Therapeutic Activity: 23-37 mins                    G  Codes:       4:16 PM, March 31, 2017 Lonell Grandchild, MPT Physical Therapist with [facility] [phone] office [phone] mobile phone

## 2017-03-15 NOTE — Progress Notes (Signed)
Daily Progress Note   Patient Name: Joann Hunter       Date: 03/15/2017 DOB: 10/23/1932  Age: 84 y.o. MRN#: [id number] Attending Physician: Joann Dike, MD Primary Care Physician: Joann Blitz, MD Admit Date: 03/11/2017  Reason for Consultation/Follow-up: Establishing goals of care, Hospice Evaluation and Psychosocial/spiritual support  Subjective: Joann Hunter is lying quietly in bed.  There is no family at bedside at this time.  Nursing staff is at bedside preparing to administer medications.  Joann Hunter is able to tell me she would like water.  She is able to drink without overt signs and symptoms of aspiration.  She is oriented to self only, and denies pain or concerns.  Conference with nursing staff related to plan of care, family dynamics.  Conference with hospitalist, Dr. Roderic Hunter related to plan of care.  Conference with chaplain Joann Hunter related to psychosocial support.   No call to family today, healthcare power of attorney/daughter, Joann Hunter is working until 3 PM today.  Daughter Joann Hunter, is agreeable for hospice placement.  Length of Stay: 4  Current Medications: Scheduled Meds:  . aspirin EC  81 mg Oral Daily  . feeding supplement (ENSURE ENLIVE)  237 mL Oral BID BM  . levothyroxine  112 mcg Oral QAC breakfast  . metoprolol tartrate  12.5 mg Oral BID  . omega-3 acid ethyl esters  1 g Oral Daily  . pantoprazole  40 mg Oral QHS  . tamsulosin  0.4 mg Oral QPC supper  . Vitamin D (Ergocalciferol)  50,000 Units Oral Once per day on Tue Thu    Continuous Infusions:   PRN Meds: acetaminophen **OR** acetaminophen, HYDROcodone-acetaminophen, meclizine, nitroGLYCERIN, ondansetron **OR** ondansetron (ZOFRAN) IV, polyethylene glycol  Physical Exam  Constitutional: No  distress.  Appears frail, chronically ill  HENT:  Head: Atraumatic.  Some temporal wasting  Cardiovascular: Normal rate.  Pulmonary/Chest: Effort normal. No respiratory distress.  Abdominal: Soft. She exhibits no distension.  Musculoskeletal: She exhibits no edema.  Neurological:  Eyes are open, oriented to self only  Skin: Skin is warm and dry.  Psychiatric:  Calm and cooperative  Nursing note and vitals reviewed.           Vital Signs: BP (!) 143/53 (BP Location: Left Arm)   Pulse 71   Temp 98.6 F (37 C)  Resp 13   Ht 5\' 2"  (1.575 m)   Wt 74.6 kg (164 lb 7.4 oz)   SpO2 94%   BMI 30.08 kg/m  SpO2: SpO2: 94 % O2 Device: O2 Device: Room Air O2 Flow Rate:    Intake/output summary:   Intake/Output Summary (Last 24 hours) at 03/15/2017 1337 Last data filed at 03/15/2017 1012 Gross per 24 hour  Intake 240 ml  Output 501 ml  Net -261 ml   LBM: Last BM Date: 03/14/17 Baseline Weight: Weight: 79.4 kg (175 lb) Most recent weight: Weight: 74.6 kg (164 lb 7.4 oz)       Palliative Assessment/Data:    Flowsheet Rows     Most Recent Value  Intake Tab  Referral Department  Hospitalist  Unit at Time of Referral  Med/Surg Unit  Palliative Care Primary Diagnosis  Cancer  Date Notified  03/13/17  Palliative Care Type  New Palliative care  Reason for referral  Clarify Goals of Care  Date of Admission  03/11/17  Date first seen by Palliative Care  03/13/17  # of days Palliative referral response time  0 Day(s)  # of days IP prior to Palliative referral  2  Clinical Assessment  Palliative Performance Scale Score  20%  Pain Max last 24 hours  Not able to report  Pain Min Last 24 hours  Not able to report  Dyspnea Max Last 24 Hours  Not able to report  Dyspnea Min Last 24 hours  Not able to report  Psychosocial & Spiritual Assessment  Palliative Care Outcomes  Patient/Family meeting held?  Yes  Who was at the meeting?  Daughter/HC POA, Joann Hunter at bedside    Palliative Care Outcomes  Clarified goals of care, Provided psychosocial or spiritual support      Patient Active Problem List   Diagnosis Date Noted  . DNR (do not resuscitate) discussion   . Palliative care by specialist   . Syncope   . Acute renal failure superimposed on stage 4 chronic kidney disease ([place]) 03/11/2017  . Vasovagal syncope 03/11/2017  . Hypercalcemia of malignancy 03/11/2017  . AKI (acute kidney injury) ([place]) 01/29/2017  . Pressure injury of skin 01/23/2017  . Pain and swelling of left lower leg 01/21/2017  . Hypokalemia 06/16/2016  . Hip fracture, unspecified laterality, closed, initial encounter ([place]) 04/28/2016  . Ischemic cardiomyopathy 04/28/2016  . Hyperlipidemia 04/28/2016  . Cancer ([place]) 04/28/2016  . Hip fracture ([place]) 04/28/2016  . Dehydration 04/14/2016  . Orthostasis 02/24/2016  . Acute encephalopathy 02/24/2016  . Confusion 02/21/2016  . Neutropenia ([place]) 02/20/2016  . Thrombocytopenia ([place]) 02/20/2016  . ESBL (extended spectrum beta-lactamase) producing bacteria infection 02/20/2016  . Goals of care, counseling/discussion 02/02/2016  . Urothelial cancer ([place]) 01/01/2016  . Rectal bleeding 11/05/2014  . Absolute anemia 11/05/2014  . Hypothyroidism 11/05/2014  . Metabolic encephalopathy 84/16/6063  . Type 2 diabetes with nephropathy ([place]) 07/26/2014  . Essential hypertension 07/26/2014  . Hyponatremia 07/25/2014  . Vertigo 07/25/2014  . Acute kidney injury superimposed on chronic kidney disease ([place]) 12/13/2013  . Fall 12/13/2013  . Lower urinary tract infectious disease 12/13/2013  . Chronic kidney disease (CKD), stage IV (severe) ([place]) 12/13/2013  . Chest pain at rest 04/26/2011  . Syncope, near 04/26/2011  . CAD (coronary artery disease) 04/23/2011  . Cardiomyopathy, ischemic 04/23/2011  . Dyslipidemia 04/23/2011  . Myocardial infarction, anterior wall, initial care [facility]) 04/20/2011    Palliative Care Assessment & Plan   Patient  Profile: 84 y.o.femalewith  past medical history of arthritis, chronic back pain, coronary artery disease with N STEMI April 2013, kidney cancer November 2017, chronic kidney disease stage IV, high blood pressure and cholesterol, hypothyroidism, ischemic cardiomyopathy EF of 45 in 2013, type 2 diabetes, history of UTIs, and treated for urothelial cancer, 4hospital admits and 4ED visits in 6 months, admitted on3/9/2019with orthostatic hypotension, dehydration.   Assessment: orthostatic hypotension, dehydration: Fluid resuscitation, but no improvement in mental status.    3/12 appears to be actively dying.   3/13, some improvement today, likely rally but no long-term improvement expected.  Recommendations/Plan:  Continue CODE STATUS discussion with daughter/HC POA, Kathy.  Daughter [name] states that she would endorse DNR, but feels she must respect her sister.  Goals of Care and Additional Recommendations:  Limitations on Scope of Treatment: Full Scope Treatment  Code Status:    Code Status Orders  (From admission, onward)        Start     Ordered   03/11/17 1618  Full code  Continuous     03/11/17 1617    Code Status History    Date Active Date Inactive Code Status Order ID Comments User Context   02/05/2017 18:42 02/08/2017 20:50 Full Code [id number]  Bethena Roys, MD Inpatient   01/28/2017 23:24 02/03/2017 15:19 Full Code [id number]  Etta Quill, DO ED   01/21/2017 16:43 01/25/2017 16:54 Full Code [id number]  Truett Mainland, DO Inpatient   04/28/2016 02:34 05/02/2016 17:24 Full Code [id number]  Orvan Falconer, MD Inpatient   02/24/2016 17:13 02/25/2016 16:03 Full Code [id number]  Erline Hau, MD Inpatient   02/20/2016 21:38 02/22/2016 20:09 Full Code [id number]  Truett Mainland, DO Inpatient   09/04/2015 20:34 09/05/2015 16:48 Full Code [id number]  Truett Mainland, DO Inpatient   11/05/2014 17:35 11/06/2014 21:54 Full Code [id number]  Joann Dike, MD Inpatient    09/14/2014 17:37 09/18/2014 20:18 Full Code [id number]  Doree Albee, MD ED   07/25/2014 21:12 08/02/2014 14:44 Full Code [id number]  Truett Mainland, DO Inpatient   12/13/2013 15:00 12/16/2013 18:05 Full Code [id number]  Verlee Monte, MD ED    Advance Directive Documentation     Most Recent Value  Type of Advance Directive  Healthcare Power of Attorney  Pre-existing out of facility DNR order (yellow form or pink MOST form)  No data  "MOST" Form in Place?  No data       Prognosis:   < 2 weeks, based on advancement of cancer even with palliative treatment, functional status, encephalopathy.  Discharge Planning:  Anticipated Hospital Death, would not be surprising.  Care plan was discussed with nursing staff, chaplain Joann Hunter, Dr. Roderic Hunter.  Thank you for allowing the Palliative Medicine Team to assist in the care of this patient.   Time In: 0920 Time Out: 0945 Total Time 25 minutes Prolonged Time Billed  no       Greater than 50%  of this time was spent counseling and coordinating care related to the above assessment and plan.  Drue Novel, NP  Please contact Palliative Medicine Team phone at [phone] for questions and concerns.

## 2017-03-16 DIAGNOSIS — I2510 Atherosclerotic heart disease of native coronary artery without angina pectoris: Secondary | ICD-10-CM | POA: Diagnosis not present

## 2017-03-16 DIAGNOSIS — D696 Thrombocytopenia, unspecified: Secondary | ICD-10-CM | POA: Diagnosis not present

## 2017-03-16 DIAGNOSIS — N179 Acute kidney failure, unspecified: Secondary | ICD-10-CM

## 2017-03-16 DIAGNOSIS — Z515 Encounter for palliative care: Secondary | ICD-10-CM | POA: Diagnosis not present

## 2017-03-16 DIAGNOSIS — R55 Syncope and collapse: Secondary | ICD-10-CM

## 2017-03-16 DIAGNOSIS — C689 Malignant neoplasm of urinary organ, unspecified: Secondary | ICD-10-CM

## 2017-03-16 DIAGNOSIS — Z7189 Other specified counseling: Secondary | ICD-10-CM | POA: Diagnosis not present

## 2017-03-16 LAB — CBC
HCT: 29.8 % — ABNORMAL LOW (ref 36.0–46.0)
HEMOGLOBIN: 9.4 g/dL — AB (ref 12.0–15.0)
MCH: 31.5 pg (ref 26.0–34.0)
MCHC: 31.5 g/dL (ref 30.0–36.0)
MCV: 100 fL (ref 78.0–100.0)
Platelets: 135 10*3/uL — ABNORMAL LOW (ref 150–400)
RBC: 2.98 MIL/uL — ABNORMAL LOW (ref 3.87–5.11)
RDW: 14.5 % (ref 11.5–15.5)
WBC: 3.8 10*3/uL — ABNORMAL LOW (ref 4.0–10.5)

## 2017-03-16 LAB — COMPREHENSIVE METABOLIC PANEL
ALT: 10 U/L — AB (ref 14–54)
ANION GAP: 9 (ref 5–15)
AST: 19 U/L (ref 15–41)
Albumin: 2.4 g/dL — ABNORMAL LOW (ref 3.5–5.0)
Alkaline Phosphatase: 50 U/L (ref 38–126)
BILIRUBIN TOTAL: 0.8 mg/dL (ref 0.3–1.2)
BUN: 32 mg/dL — AB (ref 6–20)
CALCIUM: 9.2 mg/dL (ref 8.9–10.3)
CO2: 26 mmol/L (ref 22–32)
CREATININE: 1.69 mg/dL — AB (ref 0.44–1.00)
Chloride: 103 mmol/L (ref 101–111)
GFR calc Af Amer: 31 mL/min — ABNORMAL LOW (ref >60)
GFR calc non Af Amer: 27 mL/min — ABNORMAL LOW (ref >60)
Glucose, Bld: 102 mg/dL — ABNORMAL HIGH (ref 65–99)
Potassium: 3.6 mmol/L (ref 3.5–5.1)
SODIUM: 138 mmol/L (ref 135–145)
TOTAL PROTEIN: 5.1 g/dL — AB (ref 6.5–8.1)

## 2017-03-16 MED ORDER — HEPARIN SOD (PORK) LOCK FLUSH 100 UNIT/ML IV SOLN
500.0000 [IU] | INTRAVENOUS | Status: AC | PRN
  Administered 2017-03-16: 500 [IU]

## 2017-03-16 MED ORDER — POTASSIUM CHLORIDE ER 10 MEQ PO TBCR: 10.0000 meq | EXTENDED_RELEASE_TABLET | Freq: Every day | ORAL | 0 refills | Status: AC

## 2017-03-16 MED ORDER — TORSEMIDE 5 MG PO TABS: 5.0000 mg | ORAL_TABLET | Freq: Every day | ORAL | 0 refills | Status: DC

## 2017-03-16 MED ORDER — ONDANSETRON HCL 4 MG PO TABS: 4.0000 mg | ORAL_TABLET | Freq: Three times a day (TID) | ORAL | 0 refills | Status: AC | PRN

## 2017-03-16 MED ORDER — TAMSULOSIN HCL 0.4 MG PO CAPS: 0.4000 mg | ORAL_CAPSULE | Freq: Every day | ORAL | 0 refills | Status: AC

## 2017-03-16 NOTE — Progress Notes (Signed)
Daily Progress Note   Patient Name: Joann Hunter       Date: 03/16/2017 DOB: 06/26/1932  Age: 84 y.o. MRN#: [id number] Attending Physician: Thurnell Lose, MD Primary Care Physician: Monico Blitz, MD Admit Date: 03/11/2017  Reason for Consultation/Follow-up: Establishing goals of care and Psychosocial/spiritual support  Subjective: Joann Hunter is resting quietly in bed.  She makes and keeps eye contact as I enter.  She is oriented to self, knows that we are in the hospital, but is unable to tell me the month or year.  There is no family at bedside at this time.  Joann Hunter tells me that she does feel somewhat better.  I ask if she is ready to go home, and she says that she believes so.  I ask if she will tell me what she understands about her cancer.  She is very hesitant to speak.  I share that we have new information about her cancer, and ask if she would like to hear it.  She says she would.  I gently share that her cancer has progressed even with palliative treatment.  Joann Hunter states that she will just have to do the best that she can.  I share that she is correct, and that really, nothing has changed.  She will continue to do the best she can.  Joann Hunter states that when God calls her she will go.   We talked briefly about CODE STATUS.  I share that when she cannot speak, we listen to her daughter [name].  I share that when her heart naturally stops we do not recommend to press on her chest, try to restart her heart.  When God calls we let her go.  She is unable to speak to this.  I reassure her that we will continue to care for her in any way she sees fit.  I ask if she would like to return to the hospital if needed.  She states that she would if she has a way.  I share that she can  always call 911.  Call to daughter [name] at workplace,  no voicemail message left.  Call to cell phone.  No voice mail set up.  Call to home number listed in chart, voicemail says "[place] M[place] and [place]", no voicemail message left.  Call to  daughter, Donne Hazel.  Voicemail message left on cell phone.  Length of Stay: 5  Current Medications: Scheduled Meds:  . aspirin EC  81 mg Oral Daily  . feeding supplement (ENSURE ENLIVE)  237 mL Oral BID BM  . levothyroxine  112 mcg Oral QAC breakfast  . metoprolol tartrate  12.5 mg Oral BID  . omega-3 acid ethyl esters  1 g Oral Daily  . pantoprazole  40 mg Oral QHS  . tamsulosin  0.4 mg Oral QPC supper  . Vitamin D (Ergocalciferol)  50,000 Units Oral Once per day on Tue Thu    Continuous Infusions:   PRN Meds: acetaminophen **OR** [DISCONTINUED] acetaminophen, HYDROcodone-acetaminophen, meclizine, nitroGLYCERIN, [DISCONTINUED] ondansetron **OR** ondansetron (ZOFRAN) IV, polyethylene glycol  Physical Exam  Constitutional: No distress.  Briefly makes but does not keep eye contact, appears chronically ill  HENT:  Head: Atraumatic.  Cardiovascular: Normal rate.  Pulmonary/Chest: Effort normal. No respiratory distress.  Abdominal: Soft. She exhibits no distension.  Musculoskeletal: She exhibits no edema.  Neurological: She is alert.  Oriented to person and place but not time.  Skin: Skin is warm and dry.  Psychiatric:  Calm and cooperative  Nursing note and vitals reviewed.           Vital Signs: BP (!) 131/53 (BP Location: Left Arm)   Pulse 65   Temp 98.2 F (36.8 C) (Oral)   Resp 16   Ht 5\' 2"  (1.575 m)   Wt 78.5 kg (173 lb 1 oz)   SpO2 97%   BMI 31.65 kg/m  SpO2: SpO2: 97 % O2 Device: O2 Device: Room Air O2 Flow Rate:    Intake/output summary:   Intake/Output Summary (Last 24 hours) at 03/16/2017 1054 Last data filed at 03/16/2017 1031 Gross per 24 hour  Intake -  Output 800 ml  Net -800 ml   LBM: Last BM Date:  03/15/17 Baseline Weight: Weight: 79.4 kg (175 lb) Most recent weight: Weight: 78.5 kg (173 lb 1 oz)       Palliative Assessment/Data:    Flowsheet Rows     Most Recent Value  Intake Tab  Referral Department  Hospitalist  Unit at Time of Referral  Med/Surg Unit  Palliative Care Primary Diagnosis  Cancer  Date Notified  03/13/17  Palliative Care Type  New Palliative care  Reason for referral  Clarify Goals of Care  Date of Admission  03/11/17  Date first seen by Palliative Care  03/13/17  # of days Palliative referral response time  0 Day(s)  # of days IP prior to Palliative referral  2  Clinical Assessment  Palliative Performance Scale Score  20%  Pain Max last 24 hours  Not able to report  Pain Min Last 24 hours  Not able to report  Dyspnea Max Last 24 Hours  Not able to report  Dyspnea Min Last 24 hours  Not able to report  Psychosocial & Spiritual Assessment  Palliative Care Outcomes  Patient/Family meeting held?  Yes  Who was at the meeting?  Daughter/HC POA, Melody Haver at bedside  Palliative Care Outcomes  Clarified goals of care, Provided psychosocial or spiritual support      Patient Active Problem List   Diagnosis Date Noted  . DNR (do not resuscitate) discussion   . Palliative care by specialist   . Syncope   . Acute renal failure superimposed on stage 4 chronic kidney disease ([place]) 03/11/2017  . Vasovagal syncope 03/11/2017  . Hypercalcemia of malignancy 03/11/2017  .  Acute kidney injury ([place]) 01/29/2017  . Pressure injury of skin 01/23/2017  . Pain and swelling of left lower leg 01/21/2017  . Hypokalemia 06/16/2016  . Hip fracture, unspecified laterality, closed, initial encounter ([place]) 04/28/2016  . Ischemic cardiomyopathy 04/28/2016  . Hyperlipidemia 04/28/2016  . Cancer ([place]) 04/28/2016  . Hip fracture ([place]) 04/28/2016  . Dehydration 04/14/2016  . Orthostasis 02/24/2016  . Acute encephalopathy 02/24/2016  . Confusion 02/21/2016  . Neutropenia  ([place]) 02/20/2016  . Thrombocytopenia ([place]) 02/20/2016  . ESBL (extended spectrum beta-lactamase) producing bacteria infection 02/20/2016  . Goals of care, counseling/discussion 02/02/2016  . Urothelial cancer ([place]) 01/01/2016  . Rectal bleeding 11/05/2014  . Absolute anemia 11/05/2014  . Hypothyroidism 11/05/2014  . Metabolic encephalopathy 99/37/1696  . Type 2 diabetes with nephropathy ([place]) 07/26/2014  . Essential hypertension 07/26/2014  . Hyponatremia 07/25/2014  . Vertigo 07/25/2014  . Acute kidney injury superimposed on chronic kidney disease ([place]) 12/13/2013  . Fall 12/13/2013  . Lower urinary tract infectious disease 12/13/2013  . Chronic kidney disease (CKD), stage IV (severe) ([place]) 12/13/2013  . Chest pain at rest 04/26/2011  . Syncope, near 04/26/2011  . CAD (coronary artery disease) 04/23/2011  . Cardiomyopathy, ischemic 04/23/2011  . Dyslipidemia 04/23/2011  . Myocardial infarction, anterior wall, initial care [facility]) 04/20/2011    Palliative Care Assessment & Plan   Patient Profile: 84 y.o.femalewith past medical history of arthritis, chronic back pain, coronary artery disease with N STEMI April 2013, kidney cancer November 2017, chronic kidney disease stage IV, high blood pressure and cholesterol, hypothyroidism, ischemic cardiomyopathy EF of 45 in 2013, type 2 diabetes, history of UTIs, and treated for urothelial cancer, 4hospital admits and 4ED visits in 6 months, admitted on3/9/2019with orthostatic hypotension, dehydration.  Assessment: orthostatic hypotension, dehydration:Fluid resuscitation, but no improvement in mental status.   3/12 appears to be actively dying.  3/13, some improvement today, likely rally but no long-term improvement expected.  3/14, mild improvement today, oriented to person and place.  Progression of cancer, expect readmit.  Recommendations/Plan:  Continue CODE STATUS discussion with daughter/HC POA, Kathy. Daughter [name] states  that she would endorse DNR, but feels she must respect her sister.   Goals of Care and Additional Recommendations:  Limitations on Scope of Treatment: Full Scope Treatment  Code Status:    Code Status Orders  (From admission, onward)        Start     Ordered   03/11/17 1618  Full code  Continuous     03/11/17 1617    Code Status History    Date Active Date Inactive Code Status Order ID Comments User Context   02/05/2017 18:42 02/08/2017 20:50 Full Code [id number]  Bethena Roys, MD Inpatient   01/28/2017 23:24 02/03/2017 15:19 Full Code [id number]  Etta Quill, DO ED   01/21/2017 16:43 01/25/2017 16:54 Full Code [id number]  Truett Mainland, DO Inpatient   04/28/2016 02:34 05/02/2016 17:24 Full Code [id number]  Orvan Falconer, MD Inpatient   02/24/2016 17:13 02/25/2016 16:03 Full Code [id number]  Erline Hau, MD Inpatient   02/20/2016 21:38 02/22/2016 20:09 Full Code [id number]  Truett Mainland, DO Inpatient   09/04/2015 20:34 09/05/2015 16:48 Full Code [id number]  Truett Mainland, DO Inpatient   11/05/2014 17:35 11/06/2014 21:54 Full Code [id number]  Kathie Dike, MD Inpatient   09/14/2014 17:37 09/18/2014 20:18 Full Code [id number]  Doree Albee, MD ED   07/25/2014 21:12 08/02/2014 14:44 Full Code [id number]  Truett Mainland, DO Inpatient  12/13/2013 15:00 12/16/2013 18:05 Full Code [id number]  Verlee Monte, MD ED    Advance Directive Documentation     Most Recent Value  Type of Advance Directive  Healthcare Power of Attorney  Pre-existing out of facility DNR order (yellow form or pink MOST form)  No data  "MOST" Form in Place?  No data       Prognosis:   < 4 weeks, would not be surprising based on advancement of cancer even with palliative treatment, functional status, encephalopathy, current admit with hypercalcemia that has been corrected medically, but is expected to return.  Discharge Planning:  Home with home health  Care plan was discussed with nursing staff,  Dr. Candiss Norse.   Thank you for allowing the Palliative Medicine Team to assist in the care of this patient.   Time In: 0910 Time Out: 0945 Total Time 35 minutes Prolonged Time Billed  no       Greater than 50%  of this time was spent counseling and coordinating care related to the above assessment and plan.  Drue Novel, NP  Please contact Palliative Medicine Team phone at [phone] for questions and concerns.

## 2017-03-16 NOTE — Care Management Important Message (Signed)
Important Message  Patient Details  Name: Joann Hunter MRN: [id number] Date of Birth: 07/01/1932   Medicare Important Message Given:  Yes    Sherald Barge, RN 03/16/2017, 10:37 AM

## 2017-03-16 NOTE — Care Management Note (Signed)
Case Management Note  Patient Details  Name: Joann Hunter MRN: [id number] Date of Birth: [birth year]  Subjective/Objective:       Brought to hospital after DC from SNF. Family acknowledges pt difficult to be cared for at home. They have not initiated long term care arrangements including medicaid. Was referred to [facility] at time of DC from SNF. First visit never made. PMT working with pt and family, not receptive to hospice at this time.              Action/Plan: Pt discharging home today with [facility] services. Pt aware HH has 48 hrs to make first visit. Pt needs RW also. [name], [facility] rep, aware of referrals and will pull pt info from chart and deliver RW to pt room prior to DC. CM will not reach out to daughters as pt is alert and oriented this AM. DC plan has been discussed with daughters yesterday and both MD and PMNP have called daughters with no answer today.   Expected Discharge Date:  03/16/17               Expected Discharge Plan:  [place]  In-House Referral:  Clinical Social Work, Hospice / Palliative Care  Discharge planning Services  CM Consult  Post Acute Care Choice:  Durable Medical Equipment, Home Health Choice offered to:  Patient  DME Arranged:  [facility] DME Agency:  [place] Arranged:  RN, PT, Nurse's Aide, Social Work [employer] Agency:  [employer]  Status of Service:  Completed, signed off   Sherald Barge, [place] 03/16/2017, 11:15 AM

## 2017-03-16 NOTE — Discharge Instructions (Signed)
Follow with Primary MD Monico Blitz, MD in 7 days along with your oncologist within 5-7 days.  Get CBC, CMP, 2 view Chest X ray checked  by Primary MD in 5-7 days   Activity: As tolerated with Full fall precautions use walker/cane & assistance as needed  Disposition Home    Diet:   Heart Healthy with feeding assistance and aspiration precautions.  For Heart failure patients - Check your Weight same time everyday, if you gain over 2 pounds, or you develop in leg swelling, experience more shortness of breath or chest pain, call your Primary MD immediately. Follow Cardiac Low Salt Diet and 1.5 lit/day fluid restriction.  Special Instructions: If you have smoked or chewed Tobacco  in the last 2 yrs please stop smoking, stop any regular Alcohol  and or any Recreational drug use.  On your next visit with your primary care physician please Get Medicines reviewed and adjusted.  Please request your Prim.MD to go over all Hospital Tests and Procedure/Radiological results at the follow up, please get all Hospital records sent to your Prim MD by signing hospital release before you go home.  If you experience worsening of your admission symptoms, develop shortness of breath, life threatening emergency, suicidal or homicidal thoughts you must seek medical attention immediately by calling 911 or calling your MD immediately  if symptoms less severe.  You Must read complete instructions/literature along with all the possible adverse reactions/side effects for all the Medicines you take and that have been prescribed to you. Take any new Medicines after you have completely understood and accpet all the possible adverse reactions/side effects.

## 2017-03-16 NOTE — Progress Notes (Addendum)
Patient to discharge home today. Home health arranged per CM. Patient's daughter [name] at bedside this afternoon, states her sister Dareen Gutzwiller, who is also patient's hPOA will be off work around 1700-1730 and they will need EMS transport. EMS transport not available after 5pm per CSW. Nursing attempted to contact [facility] regarding discharge. No answer on cell phone or work number. Home number responder stated she is at work. Debbie stated would attempt to call her at work, nursing requested she notify us so we can speak with [facility] regarding discharge plan. [name] states she spoke with Tye Maryland and she wants EMS transport and [name] will go to her home to stay with her at discharge until Tye Maryland gets home from work. Notified Jolene Provost, CM. Stated both daugthers are involved in care and Tye Maryland was aware of discharge plan, so okay to call EMS for transport. EMS notified of transport need. Donavan Foil, RN Foley catheter d/c'd as ordered this am. Pt and daughter aware to monitor voiding and notify PCP or come to ER if any difficulty voiding. Verbalized understanding. Donavan Foil, RN

## 2017-03-16 NOTE — Discharge Summary (Addendum)
Joann Hunter YNW:[id number] DOB: [birth year] DOA: 03/11/2017  PCP: Monico Blitz, MD  Admit date: 03/11/2017  Discharge date: 03/16/2017  Admitted From: Home   Disposition:  Home   Recommendations for Outpatient Follow-up:   Follow up with PCP in 1-2 weeks  PCP Please obtain BMP/CBC, 2 view CXR in 1week,  (see Discharge instructions)   PCP Please follow up on the following pending results:     Home Health: PT, RN, social work Equipment/Devices: Environmental consultant Consultations: Palliative care Discharge Condition: Guarded CODE STATUS: Full Diet Recommendation: Heart Healthy with feeding assistance and aspiration precautions   Chief Complaint  Patient presents with  . Dizziness     Brief history of present illness from the day of admission and additional interim summary    Joann Hunter a 84 y.o.femalewith urothelial cancer who has not been treated in the last month due to ongoing medical illnesses and deconditioning was recently discharged from SNF rehab 03/10/17 and had been at home. She continues to be weak and had diminished appetite and oral intake over the last couple of days according to family members. She had not been eating and drinking very well at the SNF facility. She apparently had a episode of syncope when she was in the bathroom with her daughter assisting her she apparently went to the bathroom and had a brief moment of silence, and then became less responsive and passed out.                                                                   Hospital Course     1. Orthostatic hypotension -she was dehydrated due to being on high doses of Demadex at home, Demadex was held she was hydrated with IV fluids and now stable, home dose Demadex has been reduced, request PCP to monitor her weight BMP and  Demadex dose closely.  2. Vasovagal syncope - I suspect this was exacerbated by her dehydration and vagal stimulation while going to bathroom.  Treated with IV fluids.   Carotid ultrasound and EEG were nonacute, she qualified for SNF but family refused. 3. Moderate to severe dehydration - Treated with gentle IVFs ordered in setting of cardiomyopathy and holding torsemide.  Improved 4. Urinary retention - Foley catheter placed on 3/10, placed on Foley which will be continued, trial of Foley removal on 03/16/2017 a.m., if no further issue Foley will be left out, if needs to be replaced outpatient urology follow-up in a week and continue Flomax.  5. Acute on chronic CKD stage 4 - Renally dose meds and hydrated with IVFs, follow renal function panel. Creatinine has improved and appears to be approaching baseline.  6. Ischemic cardiomyopathy - gently hydrated and monitored clinically. The patient had a recent echo in Jan 2019 that showed preserved EF.  Diuretics are currently on hold as renal function recovers.  Would avoid resuming diuretics with her decreased p.o. intake and propensity to become dehydrated 7. Hypercalcemia of malignancy along with dehydration- likely secondary to malignancy, treated with bisphosphonates and IV fluids.  Calcium has since trended down.  PCP to monitor calcium levels and her vitamin D levels outpatient. 8. Essential hypertension - resume home blood pressure lowering medications and follow clinically and adjust as needed.  9. Hypothyroidism - TSH within normal limits.  10. Thrombocytopenia - likely multifactorial cause in setting of ongoing cancer, will follow closely. DC lovenox. SCDs.  Platelet count has now improved 11. Hypokalemia -placed and stable.   12. Urothelial cancer - the patient has not been able to continue chemotherapy in the last month due to deconditioning and declining rapidly over past 30 days.  Her cancer on imaging has progressed despite receiving  therapy.Her case was discussed her case with oncologist Dr. Walden Field who felt that patient would be appropriate for DNR and hospice, both were offered multiple times to patient's daughter who is the primary decision maker but she refused, her long-term prognosis is extremely guarded and her readmission chances are extremely high as family has refused a recommendation of treatment to SNF, DNR and hospice which is appropriate for this patient considering she has stage IV terminal cancer for which she cannot even tolerate palliative chemo hence most certainly will progress in a rapid fashion. Palliative care again had discussions with patient today, still wants to continue everything and take a chance.  Most certainly will get readmitted, encourage PCP to look into converting patient to DNR and encouraged him to consider hospice.   13. BPPV Vertigo -this is a chronic issue.  Home health can be provided for further vestibular therapy.  She is not a candidate for nursing home rehab. 14. Dyslipidemia - resume home statin medication.  15. CAD - stable, resume home cardiac medications, except torsemide, and follow clinically.  16. Altered mental status - likely due to hypercalcemia and dehydration.  Mental status appears to be improving and returning to baseline. 17. Severe protein calorie malnutrition.  Albumin of 2.4.  Continue home protein supplementation.   18. Failure to thrive.  Patient has had progressive cancer despite immunotherapy.  She is not been able to tolerate chemotherapy.  Discussed case at length with oncology, Dr. Walden Field who felt that DNR status would be very appropriate as well as hospice.  It does not appear that patient will tolerate further aggressive treatment.  I met with patient's daughter, Joann Hunter who is her power of attorney.  We discussed the patient's progression of illness, malnutrition, poor functional status, repeated admissions and overall poor prognosis.  I recommended DNR status as  well as comfort care/hospice.  He does not agree with these recommendations.  She seems to be focusing on patient's vertigo as the main cause of her recent decline, and not giving as much importance to her underlying, progressive cancer.  She is refusing hospice services.  She reports that patient had told her that she would want "everything done" and "to be kept alive".  This is contrary to what her other daughter, Joann Hunter had mentioned yesterday, saying that patient reported that "she was ready to go".  Clearly, Joann Hunter is having a very difficult time coming to terms with the patient's mortality.  Patient will likely end up returning home with home health, although she is very high risk for readmission.     Discharge diagnosis  Principal Problem:   Acute renal failure superimposed on stage 4 chronic kidney disease (HCC) Active Problems:   CAD (coronary artery disease)   Cardiomyopathy, ischemic   Dyslipidemia   Chronic kidney disease (CKD), stage IV (severe) (HCC)   Hyponatremia   Vertigo   Essential hypertension   Hypothyroidism   Urothelial cancer (HCC)   Thrombocytopenia (HCC)   Orthostasis   Dehydration   Hypokalemia   Acute kidney injury ([place])   Vasovagal syncope   Hypercalcemia of malignancy   Palliative care by specialist   DNR (do not resuscitate) discussion    Discharge instructions    Discharge Instructions    Diet - low sodium heart healthy   Complete by:  As directed    Discharge instructions   Complete by:  As directed    Follow with Primary MD Monico Blitz, MD in 7 days along with your oncologist within 5-7 days.  Get CBC, CMP, 2 view Chest X ray checked  by Primary MD in 5-7 days   Activity: As tolerated with Full fall precautions use walker/cane & assistance as needed  Disposition Home    Diet:   Heart Healthy with feeding assistance and aspiration precautions.  For Heart failure patients - Check your Weight same time everyday, if you gain over 2  pounds, or you develop in leg swelling, experience more shortness of breath or chest pain, call your Primary MD immediately. Follow Cardiac Low Salt Diet and 1.5 lit/day fluid restriction.  Special Instructions: If you have smoked or chewed Tobacco  in the last 2 yrs please stop smoking, stop any regular Alcohol  and or any Recreational drug use.  On your next visit with your primary care physician please Get Medicines reviewed and adjusted.  Please request your Prim.MD to go over all Hospital Tests and Procedure/Radiological results at the follow up, please get all Hospital records sent to your Prim MD by signing hospital release before you go home.  If you experience worsening of your admission symptoms, develop shortness of breath, life threatening emergency, suicidal or homicidal thoughts you must seek medical attention immediately by calling 911 or calling your MD immediately  if symptoms less severe.  You Must read complete instructions/literature along with all the possible adverse reactions/side effects for all the Medicines you take and that have been prescribed to you. Take any new Medicines after you have completely understood and accpet all the possible adverse reactions/side effects.   Increase activity slowly   Complete by:  As directed       Discharge Medications   Allergies as of 03/16/2017      Reactions   Ciprofloxacin Other (See Comments)   Hallucination   Nitrofurantoin Other (See Comments)   Dizziness: Resulting in a fall   Lipitor [atorvastatin] Other (See Comments)   Muscle Weakness   Penicillins Rash   Has patient had a PCN reaction causing immediate rash, facial/tongue/throat swelling, SOB or lightheadedness with hypotension: unknown Has patient had a PCN reaction causing severe rash involving mucus membranes or skin necrosis: unknown Has patient had a PCN reaction that required hospitalization: unknown Has patient had a PCN reaction occurring within the last 10  years: unknown If all of the above answers are "NO", then may proceed with Cephalosporin use. 2      Medication List    STOP taking these medications   prochlorperazine 10 MG tablet Commonly known as:  COMPAZINE   promethazine 25 MG tablet Commonly known as:  PHENERGAN  Vitamin D (Ergocalciferol) 50000 units Caps capsule Commonly known as:  DRISDOL     TAKE these medications   aspirin EC 81 MG tablet Take 81 mg by mouth daily.   ATIVAN 0.5 MG tablet Generic drug:  LORazepam Take 1 tablet by mouth at bedtime.   feeding supplement (ENSURE ENLIVE) Liqd Take 237 mLs by mouth 2 (two) times daily between meals.   fish oil-omega-3 fatty acids 1000 MG capsule Take 1 g by mouth daily.   FLONASE ALLERGY RELIEF 50 MCG/ACT nasal spray Generic drug:  fluticasone Place 1 spray into the nose daily as needed for allergies.   GEMZAR IV Inject into the vein. Day 1,8,15 every 28 days   HYDROcodone-acetaminophen 5-325 MG tablet Commonly known as:  NORCO/VICODIN Take 1 tablet by mouth every 6 (six) hours as needed for moderate pain.   levothyroxine 112 MCG tablet Commonly known as:  SYNTHROID, LEVOTHROID Take 1 tablet (112 mcg total) by mouth daily before breakfast.   lidocaine-prilocaine cream Commonly known as:  EMLA Apply a quarter size amount to affected area 1 hour prior to coming to chemotherapy.  Do not rub in.  Cover with plastic wrap.   meclizine 25 MG tablet Commonly known as:  ANTIVERT Take 25 mg by mouth 4 (four) times daily as needed. Dizziness   metoprolol tartrate 25 MG tablet Commonly known as:  LOPRESSOR Take 12.5 mg by mouth 2 (two) times daily.   nitroGLYCERIN 0.4 MG SL tablet Commonly known as:  NITROSTAT Place 0.4 mg under the tongue every 5 (five) minutes x 3 doses as needed. For chest pain.   ondansetron 4 MG tablet Commonly known as:  ZOFRAN Take 1 tablet (4 mg total) by mouth every 8 (eight) hours as needed for nausea or vomiting.   pantoprazole  40 MG tablet Commonly known as:  PROTONIX Take 1 tablet by mouth at bedtime.   potassium chloride 10 MEQ tablet Commonly known as:  K-DUR Take 1 tablet (10 mEq total) by mouth daily.   tamsulosin 0.4 MG Caps capsule Commonly known as:  FLOMAX Take 1 capsule (0.4 mg total) by mouth daily after supper.   torsemide 5 MG tablet Commonly known as:  DEMADEX Take 1 tablet (5 mg total) by mouth daily. What changed:    medication strength  how much to take   traZODone 50 MG tablet Commonly known as:  DESYREL Take 1 tablet by mouth at bedtime.            Durable Medical Equipment  (From admission, onward)        Start     Ordered   03/16/17 1057  For home use only DME Walker rolling  Once    Comments:  5 wheel  Question:  Patient needs a walker to treat with the following condition  Answer:  Weakness   03/16/17 1056      Follow-up Information    Monico Blitz, MD. Schedule an appointment as soon as possible for a visit in 5 day(s).   Specialty:  Internal Medicine Why:  Also with your oncologist within a week Contact information: [address]  [place] [address] [phone]           Major procedures and Radiology Reports - PLEASE review detailed and final reports thoroughly  -         Dg Chest 1 View  Result Date: 03/11/2017 CLINICAL DATA:  Dizziness. History of diabetes, hypertension, coronary artery disease, MI EXAM: CHEST 1 VIEW COMPARISON:  Chest x-rays dated 02/05/2017 and 04/27/2016 FINDINGS: Heart size is upper normal, stable. Atherosclerotic changes noted at the aortic arch. Right chest wall Port-A-Cath is well positioned with tip at the level of the lower SVC. Lungs are clear.  No pleural effusion or pneumothorax seen. IMPRESSION: 1. No active disease.  No evidence of pneumonia or pulmonary edema. 2. Aortic atherosclerosis. Electronically Signed   By: Franki Cabot M.D.   On: 03/11/2017 13:44   Ct Head Wo Contrast  Result Date: 03/11/2017 CLINICAL DATA:   Syncopal episode today. Weakness. History of renal cancer. EXAM: CT HEAD WITHOUT CONTRAST TECHNIQUE: Contiguous axial images were obtained from the base of the skull through the vertex without intravenous contrast. COMPARISON:  Head CT dated 02/05/2017 FINDINGS: Brain: Ventricles are stable in size and configuration. No hydrocephalus. Mild chronic small vessel ischemic changes within the periventricular and subcortical white matter regions. No mass, hemorrhage, edema or other evidence of acute parenchymal abnormality. No extra-axial hemorrhage. Vascular: There are chronic calcified atherosclerotic changes of the large vessels at the skull base. No unexpected hyperdense vessel. Skull: Normal. Negative for fracture or focal lesion. Sinuses/Orbits: No acute finding. Other: None. IMPRESSION: 1. No acute findings.  No intracranial mass, hemorrhage or edema. 2. Mild chronic small vessel ischemic changes in the white matter. Electronically Signed   By: Franki Cabot M.D.   On: 03/11/2017 13:47   Mr Brain Wo Contrast  Result Date: 03/13/2017 CLINICAL DATA:  Altered mental status over the last day. EXAM: MRI HEAD WITHOUT CONTRAST TECHNIQUE: Multiplanar, multiecho Hunter sequences of the brain and surrounding structures were obtained without intravenous contrast. COMPARISON:  CT 03/11/2017.  MRI 01/29/2017. FINDINGS: Brain: Diffusion imaging does not show any acute or subacute infarction or other cause of restricted diffusion. Old small vessel ischemic changes affect the pons with a small lacunar infarction to the left of midline. Several old small vessel cerebellar infarctions, left more than right. Cerebral hemispheres show moderate chronic small-vessel ischemic changes of the deep and subcortical white matter. Old small bilateral parietal cortical infarctions. No mass lesion, hemorrhage, hydrocephalus or extra-axial collection. Vascular: Abnormal flow left vertebral artery. Right vertebral artery shows flow to the  basilar. Both carotid arteries show flow. Skull and upper cervical spine: Negative Sinuses/Orbits: Clear/normal Other: None IMPRESSION: No acute finding or visible change since the study 01/29/2017. Old ischemic changes throughout the brain as outlined above. Chronic abnormal flow in the left vertebral artery. Electronically Signed   By: Nelson Chimes M.D.   On: 03/13/2017 11:39   US Carotid Bilateral  Result Date: 03/12/2017 CLINICAL DATA:  Syncope. Coronary artery disease, hyperlipidemia, diabetes. EXAM: BILATERAL CAROTID DUPLEX ULTRASOUND TECHNIQUE: Pearline Cables scale imaging, color Doppler and duplex ultrasound was performed of bilateral carotid and vertebral arteries in the neck. COMPARISON:  None. TECHNIQUE: Quantification of carotid stenosis is based on velocity parameters that correlate the residual internal carotid diameter with NASCET-based stenosis levels, using the diameter of the distal internal carotid lumen as the denominator for stenosis measurement. Patient describes technically difficult scan secondary to patient movement and manual interference. The following velocity measurements were obtained: PEAK SYSTOLIC/END DIASTOLIC RIGHT ICA:                     115/17cm/sec CCA:                     01/02VO/ZDG SYSTOLIC ICA/CCA RATIO:  1.3 DIASTOLIC ICA/CCA RATIO: 1.6 ECA:  155cm/sec LEFT ICA:                     183/30cm/sec CCA:                     595/63OV/FIE SYSTOLIC ICA/CCA RATIO:  1.2 DIASTOLIC ICA/CCA RATIO: 2.3 ECA:                     432cm/sec FINDINGS: RIGHT CAROTID ARTERY: Scattered calcified plaque in the common carotid artery without high-grade stenosis. Circumferential eccentric partially calcified plaque in the bulb and ICA origin origin resulting in at least mild stenosis. Normal waveforms and color Doppler signal. Distal ICA mildly tortuous. RIGHT VERTEBRAL ARTERY:  Normal flow direction and waveform. LEFT CAROTID ARTERY: Calcified plaque scattered through the common  carotid artery without high-grade stenosis. Eccentric calcified plaque in the carotid bulb extending across the bifurcation. Elevated peak systolic velocities in the proximal external carotid artery with focal aliasing on color Doppler. Otherwise normal waveforms and color Doppler signal. LEFT VERTEBRAL ARTERY: Normal flow direction and waveform. IMPRESSION: 1. Bilateral carotid bifurcation and proximal ICA plaque resulting in less than 50% diameter ICA stenosis. 2. High-grade stenosis at the origin of the left external carotid Artery, of uncertain clinical significance. 3. Antegrade bilateral vertebral arterial flow. Electronically Signed   By: Lucrezia Europe M.D.   On: 03/12/2017 12:24    Micro Results     No results found for this or any previous visit (from the past 240 hour(s)).  Today   Subjective    Joann Hunter today has no headache,no chest abdominal pain,no new weakness tingling or numbness, feels much better wants to go home today.     Objective   Blood pressure (!) 138/44, Hunter 62, temperature 98.9 F (37.2 C), temperature source Oral, resp. rate 18, height '5\' 2"'$  (1.575 m), weight 78.5 kg (173 lb 1 oz), SpO2 97 %.   Intake/Output Summary (Last 24 hours) at 03/16/2017 1108 Last data filed at 03/16/2017 1031 Gross per 24 hour  Intake -  Output 800 ml  Net -800 ml    Exam Awake mildly confused, No new F.N deficits, Normal affect [place].AT,PERRAL Supple Neck,No JVD, No cervical lymphadenopathy appriciated.  Symmetrical Chest wall movement, Good air movement bilaterally, CTAB RRR,No Gallops,Rubs or new Murmurs, No Parasternal Heave +ve B.Sounds, Abd Soft, Non tender, No organomegaly appriciated, No rebound -guarding or rigidity. No Cyanosis, Clubbing or edema, No new Rash or bruise   Data Review   CBC w Diff:  Lab Results  Component Value Date   WBC 3.8 (L) 03/16/2017   HGB 9.4 (L) 03/16/2017   HCT 29.8 (L) 03/16/2017   PLT 135 (L) 03/16/2017   LYMPHOPCT 23 03/11/2017     MONOPCT 14 03/11/2017   EOSPCT 5 03/11/2017   BASOPCT 0 03/11/2017    CMP:  Lab Results  Component Value Date   NA 138 03/16/2017   K 3.6 03/16/2017   CL 103 03/16/2017   CO2 26 03/16/2017   BUN 32 (H) 03/16/2017   CREATININE 1.69 (H) 03/16/2017   PROT 5.1 (L) 03/16/2017   ALBUMIN 2.4 (L) 03/16/2017   BILITOT 0.8 03/16/2017   ALKPHOS 50 03/16/2017   AST 19 03/16/2017   ALT 10 (L) 03/16/2017  .   Total Time in preparing paper work, data evaluation and todays exam - 87 minutes  Lala Lund M.D on 03/16/2017 at 11:08 AM  [facility]   Office  [phone]

## 2017-03-16 NOTE — Progress Notes (Signed)
EMS transport arrived for discharge. Notified patient's daughter [name] who we were able to reach. Stated she was picking up prescriptions and would meet EMS at the patient's home and her sister Tye Maryland was there now. Called Tye Maryland to notify her. Stated she would be there and verbalized understanding of discharge plan. Stated no questions. Pt left floor in stable condition via stretcher with EMS transport. Donavan Foil, RN

## 2017-03-16 NOTE — Care Management Obs Status (Signed)
[facility] NOTIFICATION   Patient Details  Name: Joann Hunter MRN: [id number] Date of Birth: 1932/11/15   Medicare Observation Status Notification Given:  Yes    Sherald Barge, RN 03/16/2017, 2:14 PM

## 2017-03-16 NOTE — Care Management CC44 (Signed)
Condition Code 44 Documentation Completed  Patient Details  Name: DESHANTI ADCOX MRN: [id number] Date of Birth: 1932-06-07   Condition Code 44 given:  Yes Patient signature on Condition Code 44 notice:  Yes Documentation of 2 MD's agreement:  Yes Code 44 added to claim:  Yes    Sherald Barge, RN 03/16/2017, 2:14 PM

## 2017-03-16 NOTE — Progress Notes (Signed)
Patient bladder scan volume 52 ml at 1215. Reports no urge to void and no pressure. No void yet since foley discontinued. purewick external catheter in place to measure output when she does void. Text paged Dr. Candiss Norse to notify. Donavan Foil, RN

## 2017-03-16 NOTE — Progress Notes (Signed)
Late entry  Pt's daughter Tye Maryland) states that PT worked patient too much on 3/13 and that patient was in pain during session. Patient's daughter requests that PT does not work extensively with patient or attempt to make patient stand up as patient is not capable.

## 2017-04-14 ENCOUNTER — Other Ambulatory Visit (HOSPITAL_COMMUNITY): Payer: Self-pay | Admitting: Adult Health

## 2017-04-28 ENCOUNTER — Encounter (HOSPITAL_COMMUNITY): Payer: Self-pay | Admitting: Emergency Medicine

## 2017-04-28 ENCOUNTER — Emergency Department (HOSPITAL_COMMUNITY): Payer: Medicare Other

## 2017-04-28 ENCOUNTER — Emergency Department (HOSPITAL_COMMUNITY)
Admission: EM | Admit: 2017-04-28 | Discharge: 2017-04-28 | Disposition: A | Discharge status: Home / Self Care | Payer: Medicare Other | Attending: Emergency Medicine | Admitting: Emergency Medicine

## 2017-04-28 DIAGNOSIS — Z66 Do not resuscitate: Secondary | ICD-10-CM | POA: Diagnosis not present

## 2017-04-28 DIAGNOSIS — I129 Hypertensive chronic kidney disease with stage 1 through stage 4 chronic kidney disease, or unspecified chronic kidney disease: Secondary | ICD-10-CM | POA: Diagnosis not present

## 2017-04-28 DIAGNOSIS — I252 Old myocardial infarction: Secondary | ICD-10-CM | POA: Insufficient documentation

## 2017-04-28 DIAGNOSIS — F0390 Unspecified dementia without behavioral disturbance: Secondary | ICD-10-CM | POA: Insufficient documentation

## 2017-04-28 DIAGNOSIS — Z7982 Long term (current) use of aspirin: Secondary | ICD-10-CM | POA: Diagnosis not present

## 2017-04-28 DIAGNOSIS — Z96641 Presence of right artificial hip joint: Secondary | ICD-10-CM | POA: Diagnosis not present

## 2017-04-28 DIAGNOSIS — E1122 Type 2 diabetes mellitus with diabetic chronic kidney disease: Secondary | ICD-10-CM | POA: Insufficient documentation

## 2017-04-28 DIAGNOSIS — N184 Chronic kidney disease, stage 4 (severe): Secondary | ICD-10-CM | POA: Insufficient documentation

## 2017-04-28 DIAGNOSIS — I2510 Atherosclerotic heart disease of native coronary artery without angina pectoris: Secondary | ICD-10-CM | POA: Diagnosis not present

## 2017-04-28 DIAGNOSIS — Z8551 Personal history of malignant neoplasm of bladder: Secondary | ICD-10-CM | POA: Insufficient documentation

## 2017-04-28 DIAGNOSIS — Z85528 Personal history of other malignant neoplasm of kidney: Secondary | ICD-10-CM | POA: Insufficient documentation

## 2017-04-28 DIAGNOSIS — R0789 Other chest pain: Secondary | ICD-10-CM | POA: Diagnosis not present

## 2017-04-28 DIAGNOSIS — R079 Chest pain, unspecified: Secondary | ICD-10-CM

## 2017-04-28 LAB — BASIC METABOLIC PANEL
ANION GAP: 7 (ref 5–15)
BUN: 21 mg/dL — ABNORMAL HIGH (ref 6–20)
CHLORIDE: 103 mmol/L (ref 101–111)
CO2: 23 mmol/L (ref 22–32)
Calcium: 8.8 mg/dL — ABNORMAL LOW (ref 8.9–10.3)
Creatinine, Ser: 2.06 mg/dL — ABNORMAL HIGH (ref 0.44–1.00)
GFR calc non Af Amer: 21 mL/min — ABNORMAL LOW (ref >60)
GFR, EST AFRICAN AMERICAN: 25 mL/min — AB (ref >60)
GLUCOSE: 151 mg/dL — AB (ref 65–99)
Potassium: 4.3 mmol/L (ref 3.5–5.1)
Sodium: 133 mmol/L — ABNORMAL LOW (ref 135–145)

## 2017-04-28 LAB — CBC
HEMATOCRIT: 31.3 % — AB (ref 36.0–46.0)
HEMOGLOBIN: 10.1 g/dL — AB (ref 12.0–15.0)
MCH: 31.3 pg (ref 26.0–34.0)
MCHC: 32.3 g/dL (ref 30.0–36.0)
MCV: 96.9 fL (ref 78.0–100.0)
Platelets: 141 10*3/uL — ABNORMAL LOW (ref 150–400)
RBC: 3.23 MIL/uL — AB (ref 3.87–5.11)
RDW: 14.5 % (ref 11.5–15.5)
WBC: 3.6 10*3/uL — ABNORMAL LOW (ref 4.0–10.5)

## 2017-04-28 LAB — TROPONIN I
Troponin I: <0.03 ng/mL (ref <0.03)
Troponin I: <0.03 ng/mL (ref <0.03)

## 2017-04-28 NOTE — Discharge Instructions (Signed)
Tests showed no life-threatening condition.  Follow-up with your primary care doctor or cardiologist.

## 2017-04-28 NOTE — ED Provider Notes (Signed)
[facility] EMERGENCY DEPARTMENT Provider Note   CSN: [id number] Arrival date & time: 04/28/17  1218     History   Chief Complaint Chief Complaint  Patient presents with  . Chest Pain    HPI Joann Hunter is a 84 y.o. female.  Level 5 caveat for mild dementia.  Patient complains of chest pain since 10 AM today.  She took nitroglycerin tablets x2 which seemed to help with the pain.  No dyspnea, diaphoresis, nausea.  She has multiple health problems including CAD, hypertension, diabetes, cardiomyopathy, hypothyroidism many others.  Daughter reports normal behavior.     Past Medical History:  Diagnosis Date  . Arthritis   . Back pain, chronic   . CAD (coronary artery disease)    NSTEMI 04/2011 with newly diagnosed 3V CAD s/p PCI/DES mLAD 04/11/11 with residual dz (per Dr. Burt Knack, should have consideration of PCI of the occluded RCA based on symptoms and/or consideration of outpatient nuclear perfusion testing after the patient recovers from her infarct)  . Cancer ([place])    kidney ( Nov.2017)  . Chronic kidney disease (CKD), stage IV (severe) ([place]) 12/13/2013  . Hyperglycemia   . Hyperlipidemia   . Hypertension   . Hypothyroidism   . Ischemic cardiomyopathy    EF 45% by cath 04/20/11, 50-55% by echo 04/22/11. hypotension limiting medication titration   . Myocardial infarction ([place]) 2013  . Type 2 diabetes with nephropathy ([place]) 07/26/2014  . UTI (urinary tract infection) 12/13/2013  . Vertigo     Patient Active Problem List   Diagnosis Date Noted  . DNR (do not resuscitate) discussion   . Palliative care by specialist   . Syncope   . Acute renal failure superimposed on stage 4 chronic kidney disease ([place]) 03/11/2017  . Vasovagal syncope 03/11/2017  . Hypercalcemia of malignancy 03/11/2017  . Acute kidney injury ([place]) 01/29/2017  . Pressure injury of skin 01/23/2017  . Pain and swelling of left lower leg 01/21/2017  . Hypokalemia 06/16/2016  . Hip fracture, unspecified  laterality, closed, initial encounter ([place]) 04/28/2016  . Ischemic cardiomyopathy 04/28/2016  . Hyperlipidemia 04/28/2016  . Cancer ([place]) 04/28/2016  . Hip fracture ([place]) 04/28/2016  . Dehydration 04/14/2016  . Orthostasis 02/24/2016  . Acute encephalopathy 02/24/2016  . Confusion 02/21/2016  . Neutropenia ([place]) 02/20/2016  . Thrombocytopenia ([place]) 02/20/2016  . ESBL (extended spectrum beta-lactamase) producing bacteria infection 02/20/2016  . Goals of care, counseling/discussion 02/02/2016  . Urothelial cancer ([place]) 01/01/2016  . Rectal bleeding 11/05/2014  . Absolute anemia 11/05/2014  . Hypothyroidism 11/05/2014  . Metabolic encephalopathy 37/85/8850  . Type 2 diabetes with nephropathy ([place]) 07/26/2014  . Essential hypertension 07/26/2014  . Hyponatremia 07/25/2014  . Vertigo 07/25/2014  . Acute kidney injury superimposed on chronic kidney disease ([place]) 12/13/2013  . Fall 12/13/2013  . Lower urinary tract infectious disease 12/13/2013  . Chronic kidney disease (CKD), stage IV (severe) ([place]) 12/13/2013  . Chest pain at rest 04/26/2011  . Syncope, near 04/26/2011  . CAD (coronary artery disease) 04/23/2011  . Cardiomyopathy, ischemic 04/23/2011  . Dyslipidemia 04/23/2011  . Myocardial infarction, anterior wall, initial care ([place]) 04/20/2011    Past Surgical History:  Procedure Laterality Date  . BACK SURGERY    . BREAST LUMPECTOMY     right  . CORONARY STENT PLACEMENT    . HIP ARTHROPLASTY Right 04/29/2016   Procedure: ARTHROPLASTY BIPOLAR HIP (HEMIARTHROPLASTY);  Surgeon: Carole Civil, MD;  Location: [facility];  Service: Orthopedics;  Laterality: Right;  .  IR FLUORO GUIDE PORT INSERTION RIGHT  01/13/2017  . IR US GUIDE VASC ACCESS RIGHT  01/13/2017  . LEFT HEART CATHETERIZATION WITH CORONARY ANGIOGRAM N/A 04/20/2011   Procedure: LEFT HEART CATHETERIZATION WITH CORONARY ANGIOGRAM;  Surgeon: Burnell Blanks, MD;  Location: [facility] CATH LAB;  Service: Cardiovascular;   Laterality: N/A;  . PERCUTANEOUS CORONARY STENT INTERVENTION (PCI-S) N/A 04/21/2011   Procedure: PERCUTANEOUS CORONARY STENT INTERVENTION (PCI-S);  Surgeon: Sherren Mocha, MD;  Location: [facility] CATH LAB;  Service: Cardiovascular;  Laterality: N/A;     OB History    Gravida  2   Para  2   Term  2   Preterm      AB      Living  2     SAB      TAB      Ectopic      Multiple      Live Births               Home Medications    Prior to Admission medications   Medication Sig Start Date End Date Taking? Authorizing Provider  aspirin EC 81 MG tablet Take 81 mg by mouth daily.   Yes [provider]  fish oil-omega-3 fatty acids 1000 MG capsule Take 1 g by mouth daily.   Yes [provider]  furosemide (LASIX) 20 MG tablet  04/09/17  Yes [provider]  HYDROcodone-acetaminophen (NORCO/VICODIN) 5-325 MG tablet Take 1 tablet by mouth every 6 (six) hours as needed for moderate pain. 02/08/17  Yes Kathie Dike, MD  levothyroxine (SYNTHROID, LEVOTHROID) 112 MCG tablet Take 1 tablet (112 mcg total) by mouth daily before breakfast. 09/08/16  Yes Holley Bouche, NP  meclizine (ANTIVERT) 25 MG tablet Take 25 mg by mouth 4 (four) times daily as needed. Dizziness   Yes [provider]  metoprolol tartrate (LOPRESSOR) 25 MG tablet Take 12.5 mg by mouth 2 (two) times daily. 10/24/16  Yes [provider]  pantoprazole (PROTONIX) 40 MG tablet Take 1 tablet by mouth at bedtime. 09/29/16  Yes [provider]  potassium chloride (K-DUR) 10 MEQ tablet Take 1 tablet (10 mEq total) by mouth daily. 03/16/17  Yes Thurnell Lose, MD  tamsulosin (FLOMAX) 0.4 MG CAPS capsule Take 1 capsule (0.4 mg total) by mouth daily after supper. 03/16/17  Yes Thurnell Lose, MD  traZODone (DESYREL) 50 MG tablet Take 1 tablet by mouth at bedtime. 05/31/16  Yes [provider]  doxycycline (VIBRA-TABS) 100 MG tablet  04/17/17   [provider]    feeding supplement, ENSURE ENLIVE, (ENSURE ENLIVE) LIQD Take 237 mLs by mouth 2 (two) times daily between meals. Patient taking differently: Take 237 mLs by mouth as needed.  05/02/16   Elgergawy, Silver Huguenin, MD  fluticasone (FLONASE ALLERGY RELIEF) 50 MCG/ACT nasal spray Place 1 spray into the nose daily as needed for allergies. 01/30/15   [provider]  Gemcitabine HCl (GEMZAR IV) Inject into the vein. Day 1,8,15 every 28 days    [provider]  lidocaine-prilocaine (EMLA) cream Apply a quarter size amount to affected area 1 hour prior to coming to chemotherapy.  Do not rub in.  Cover with plastic wrap. 01/09/17   Jacquelin Hawking, NP  LORazepam (ATIVAN) 0.5 MG tablet Take 1 tablet by mouth as needed.  05/03/16   [provider]  nitroGLYCERIN (NITROSTAT) 0.4 MG SL tablet Place 0.4 mg under the tongue every 5 (five) minutes x 3 doses as  needed. For chest pain. 04/23/11   Dunn, Nedra Hai, PA-C  ondansetron (ZOFRAN) 4 MG tablet Take 1 tablet (4 mg total) by mouth every 8 (eight) hours as needed for nausea or vomiting. 03/16/17   Thurnell Lose, MD    Family History Family History  Problem Relation Age of Onset  . Other Mother   . Cancer Father   . Other Unknown        no known family cardiac disease  . Other Brother        Diptheria  . Depression Sister   . Pneumonia Sister   . Other Brother        murdered    Social History Social History   Tobacco Use  . Smoking status: Never Smoker  . Smokeless tobacco: Never Used  Substance Use Topics  . Alcohol use: No  . Drug use: No     Allergies   Ciprofloxacin; Nitrofurantoin; Lipitor [atorvastatin]; and Penicillins   Review of Systems Review of Systems  Unable to perform ROS: Dementia     Physical Exam Updated Vital Signs BP (!) 113/49   Pulse 62   Temp 98.1 F (36.7 C) (Oral)   Resp 16   Ht 5\' 2"  (1.575 m)   Wt 78.5 kg (173 lb)   SpO2 95%   BMI 31.64 kg/m   Physical Exam  Constitutional:  She is oriented to person, place, and time.  Pleasant, no acute distress  HENT:  Head: Normocephalic and atraumatic.  Eyes: Conjunctivae are normal.  Neck: Neck supple.  Cardiovascular: Normal rate and regular rhythm.  Pulmonary/Chest: Effort normal and breath sounds normal.  Abdominal: Soft. Bowel sounds are normal.  Musculoskeletal: Normal range of motion.  Neurological: She is alert and oriented to person, place, and time.  Skin: Skin is warm and dry.  Psychiatric:  demented  Nursing note and vitals reviewed.    ED Treatments / Results  Labs (all labs ordered are listed, but only abnormal results are displayed) Labs Reviewed  BASIC METABOLIC PANEL - Abnormal; Notable for the following components:      Result Value   Sodium 133 (*)    Glucose, Bld 151 (*)    BUN 21 (*)    Creatinine, Ser 2.06 (*)    Calcium 8.8 (*)    GFR calc non Af Amer 21 (*)    GFR calc Af Amer 25 (*)    All other components within normal limits  CBC - Abnormal; Notable for the following components:   WBC 3.6 (*)    RBC 3.23 (*)    Hemoglobin 10.1 (*)    HCT 31.3 (*)    Platelets 141 (*)    All other components within normal limits  TROPONIN I  TROPONIN I    EKG EKG Interpretation  Date/Time:  Friday April 28 2017 12:31:44 EDT Ventricular Rate:  64 PR Interval:    QRS Duration: 96 QT Interval:  413 QTC Calculation: 427 R Axis:   -2 Text Interpretation:  Sinus rhythm Borderline prolonged PR interval Low voltage, extremity and precordial leads Confirmed by Nat Christen [phone]) on 04/28/2017 1:14:23 PM   Radiology Dg Chest Portable 1 View  Result Date: 04/28/2017 CLINICAL DATA:  Chest pain. EXAM: PORTABLE CHEST 1 VIEW COMPARISON:  Chest x-ray dated March 11, 2017. FINDINGS: Unchanged right chest wall port catheter with the tip in the distal SVC. The heart size and mediastinal contours are within normal limits. Normal pulmonary vascularity. Low lung volumes. No focal  consolidation, pleural  effusion, or pneumothorax. IMPRESSION: No active disease. Electronically Signed   By: Titus Dubin M.D.   On: 04/28/2017 13:01    Procedures Procedures (including critical care time)  Medications Ordered in ED Medications - No data to display   Initial Impression / Assessment and Plan / ED Course  I have reviewed the triage vital signs and the nursing notes.  Pertinent labs & imaging results that were available during my care of the patient were reviewed by me and considered in my medical decision making (see chart for details).     Patient presents with chest pain which has resolved with nitroglycerin.  EKG shows no acute changes.  Chest x-ray negative.  Troponin negative x2.  Creatinine is elevated, but this is not new.  Daughter reports baseline behavior.  Can follow-up as an outpatient.  Final Clinical Impressions(s) / ED Diagnoses   Final diagnoses:  Chest pain, unspecified type    ED Discharge Orders    None       Nat Christen, MD 04/28/17 [phone]

## 2017-04-28 NOTE — ED Triage Notes (Signed)
Per EMS patient complains of chest pain that started Wednesday and resolved. Chest pain resumed today and family gave 2 nitro today. Chest pain resolved. Family called EMS.

## 2017-05-04 ENCOUNTER — Emergency Department (HOSPITAL_COMMUNITY): Payer: Medicare Other

## 2017-05-04 ENCOUNTER — Emergency Department (HOSPITAL_COMMUNITY)
Admission: EM | Admit: 2017-05-04 | Discharge: 2017-05-04 | Disposition: A | Discharge status: Home / Self Care | Payer: Medicare Other | Attending: Emergency Medicine | Admitting: Emergency Medicine

## 2017-05-04 ENCOUNTER — Other Ambulatory Visit: Payer: Self-pay

## 2017-05-04 ENCOUNTER — Encounter (HOSPITAL_COMMUNITY): Payer: Self-pay | Admitting: Emergency Medicine

## 2017-05-04 DIAGNOSIS — I2510 Atherosclerotic heart disease of native coronary artery without angina pectoris: Secondary | ICD-10-CM | POA: Insufficient documentation

## 2017-05-04 DIAGNOSIS — N3000 Acute cystitis without hematuria: Secondary | ICD-10-CM | POA: Diagnosis not present

## 2017-05-04 DIAGNOSIS — Z79899 Other long term (current) drug therapy: Secondary | ICD-10-CM | POA: Insufficient documentation

## 2017-05-04 DIAGNOSIS — Z66 Do not resuscitate: Secondary | ICD-10-CM | POA: Insufficient documentation

## 2017-05-04 DIAGNOSIS — G9341 Metabolic encephalopathy: Secondary | ICD-10-CM | POA: Diagnosis not present

## 2017-05-04 DIAGNOSIS — N184 Chronic kidney disease, stage 4 (severe): Secondary | ICD-10-CM | POA: Diagnosis not present

## 2017-05-04 DIAGNOSIS — Z85520 Personal history of malignant carcinoid tumor of kidney: Secondary | ICD-10-CM | POA: Diagnosis not present

## 2017-05-04 DIAGNOSIS — E039 Hypothyroidism, unspecified: Secondary | ICD-10-CM | POA: Insufficient documentation

## 2017-05-04 DIAGNOSIS — F0390 Unspecified dementia without behavioral disturbance: Secondary | ICD-10-CM | POA: Insufficient documentation

## 2017-05-04 DIAGNOSIS — Z8551 Personal history of malignant neoplasm of bladder: Secondary | ICD-10-CM | POA: Diagnosis not present

## 2017-05-04 DIAGNOSIS — I252 Old myocardial infarction: Secondary | ICD-10-CM | POA: Insufficient documentation

## 2017-05-04 DIAGNOSIS — E1122 Type 2 diabetes mellitus with diabetic chronic kidney disease: Secondary | ICD-10-CM | POA: Insufficient documentation

## 2017-05-04 DIAGNOSIS — I129 Hypertensive chronic kidney disease with stage 1 through stage 4 chronic kidney disease, or unspecified chronic kidney disease: Secondary | ICD-10-CM | POA: Insufficient documentation

## 2017-05-04 DIAGNOSIS — Z7982 Long term (current) use of aspirin: Secondary | ICD-10-CM | POA: Diagnosis not present

## 2017-05-04 DIAGNOSIS — Z96641 Presence of right artificial hip joint: Secondary | ICD-10-CM | POA: Diagnosis not present

## 2017-05-04 DIAGNOSIS — R4182 Altered mental status, unspecified: Secondary | ICD-10-CM | POA: Diagnosis present

## 2017-05-04 LAB — URINALYSIS, ROUTINE W REFLEX MICROSCOPIC
BILIRUBIN URINE: NEGATIVE
Glucose, UA: NEGATIVE mg/dL
HGB URINE DIPSTICK: NEGATIVE
Ketones, ur: NEGATIVE mg/dL
Nitrite: NEGATIVE
PH: 5 (ref 5.0–8.0)
Protein, ur: NEGATIVE mg/dL
Specific Gravity, Urine: 1.013 (ref 1.005–1.030)

## 2017-05-04 LAB — CBC WITH DIFFERENTIAL/PLATELET
Basophils Absolute: 0 10*3/uL (ref 0.0–0.1)
Basophils Relative: 1 %
EOS ABS: 0.2 10*3/uL (ref 0.0–0.7)
EOS PCT: 6 %
HCT: 32.3 % — ABNORMAL LOW (ref 36.0–46.0)
Hemoglobin: 10.6 g/dL — ABNORMAL LOW (ref 12.0–15.0)
Lymphocytes Relative: 24 %
Lymphs Abs: 0.9 10*3/uL (ref 0.7–4.0)
MCH: 31.5 pg (ref 26.0–34.0)
MCHC: 32.8 g/dL (ref 30.0–36.0)
MCV: 95.8 fL (ref 78.0–100.0)
MONOS PCT: 16 %
Monocytes Absolute: 0.6 10*3/uL (ref 0.1–1.0)
Neutro Abs: 2.1 10*3/uL (ref 1.7–7.7)
Neutrophils Relative %: 53 %
PLATELETS: 164 10*3/uL (ref 150–400)
RBC: 3.37 MIL/uL — AB (ref 3.87–5.11)
RDW: 14.4 % (ref 11.5–15.5)
WBC: 4 10*3/uL (ref 4.0–10.5)

## 2017-05-04 LAB — COMPREHENSIVE METABOLIC PANEL
ALT: 8 U/L — ABNORMAL LOW (ref 14–54)
ANION GAP: 9 (ref 5–15)
AST: 19 U/L (ref 15–41)
Albumin: 2.8 g/dL — ABNORMAL LOW (ref 3.5–5.0)
Alkaline Phosphatase: 68 U/L (ref 38–126)
BUN: 21 mg/dL — ABNORMAL HIGH (ref 6–20)
CHLORIDE: 101 mmol/L (ref 101–111)
CO2: 22 mmol/L (ref 22–32)
Calcium: 8.9 mg/dL (ref 8.9–10.3)
Creatinine, Ser: 2.46 mg/dL — ABNORMAL HIGH (ref 0.44–1.00)
GFR calc non Af Amer: 17 mL/min — ABNORMAL LOW (ref >60)
GFR, EST AFRICAN AMERICAN: 20 mL/min — AB (ref >60)
Glucose, Bld: 107 mg/dL — ABNORMAL HIGH (ref 65–99)
Potassium: 4.4 mmol/L (ref 3.5–5.1)
SODIUM: 132 mmol/L — AB (ref 135–145)
Total Bilirubin: 0.8 mg/dL (ref 0.3–1.2)
Total Protein: 6 g/dL — ABNORMAL LOW (ref 6.5–8.1)

## 2017-05-04 LAB — CBG MONITORING, ED: Glucose-Capillary: 104 mg/dL — ABNORMAL HIGH (ref 65–99)

## 2017-05-04 LAB — I-STAT CG4 LACTIC ACID, ED: LACTIC ACID, VENOUS: 1.68 mmol/L (ref 0.5–1.9)

## 2017-05-04 LAB — TROPONIN I: Troponin I: <0.03 ng/mL (ref <0.03)

## 2017-05-04 MED ORDER — CEPHALEXIN 500 MG PO CAPS: 500.0000 mg | ORAL_CAPSULE | Freq: Four times a day (QID) | ORAL | 0 refills | Status: DC

## 2017-05-04 MED ORDER — SODIUM CHLORIDE 0.9 % IV SOLN
1.0000 g | Freq: Once | INTRAVENOUS | Status: AC
  Administered 2017-05-04: 1 g via INTRAVENOUS
  Filled 2017-05-04: qty 10

## 2017-05-04 MED ORDER — SODIUM CHLORIDE 0.9 % IV BOLUS
1000.0000 mL | Freq: Once | INTRAVENOUS | Status: AC
  Administered 2017-05-04: 1000 mL via INTRAVENOUS

## 2017-05-04 NOTE — ED Notes (Signed)
Patient transported to X-ray 

## 2017-05-04 NOTE — ED Triage Notes (Signed)
PT's daughter reports pt has had some confusion more than her normal dementia and generalized weakness worsening over the past 5 days. PT denies any recent falls and c/o lower back pain. PT has dx of kidney cancer on the right but no treatments since 01/2017.

## 2017-05-04 NOTE — ED Provider Notes (Signed)
[facility] EMERGENCY DEPARTMENT Provider Note   CSN: [id number] Arrival date & time: 05/04/17  4098     History   Chief Complaint Chief Complaint  Patient presents with  . Altered Mental Status    HPI Joann Hunter is a 84 y.o. female.  Pt presents to the ED today with altered mental status.  Pt has a hx of dementia and lives with her daughter.  She c/o CP and came to the ED on 4/26.  The work up was fine and she was d/c home.  She has not c/o any cp since then.  The next day, she started becoming confused.  That seemed to get better, but became bad again 2 days ago.  Her daughter said she has not been sleeping.  She is hallucinating.  She is seeing and talking to people who are not there.  She is not her normal self.  Pt has no complaints.     Past Medical History:  Diagnosis Date  . Arthritis   . Back pain, chronic   . CAD (coronary artery disease)    NSTEMI 04/2011 with newly diagnosed 3V CAD s/p PCI/DES mLAD 04/11/11 with residual dz (per Dr. Burt Knack, should have consideration of PCI of the occluded RCA based on symptoms and/or consideration of outpatient nuclear perfusion testing after the patient recovers from her infarct)  . Cancer ([place])    kidney ( Nov.2017)  . Chronic kidney disease (CKD), stage IV (severe) ([place]) 12/13/2013  . Hyperglycemia   . Hyperlipidemia   . Hypertension   . Hypothyroidism   . Ischemic cardiomyopathy    EF 45% by cath 04/20/11, 50-55% by echo 04/22/11. hypotension limiting medication titration   . Myocardial infarction ([place]) 2013  . Type 2 diabetes with nephropathy ([place]) 07/26/2014  . UTI (urinary tract infection) 12/13/2013  . Vertigo     Patient Active Problem List   Diagnosis Date Noted  . DNR (do not resuscitate) discussion   . Palliative care by specialist   . Syncope   . Acute renal failure superimposed on stage 4 chronic kidney disease ([place]) 03/11/2017  . Vasovagal syncope 03/11/2017  . Hypercalcemia of malignancy 03/11/2017  .  Acute kidney injury ([place]) 01/29/2017  . Pressure injury of skin 01/23/2017  . Pain and swelling of left lower leg 01/21/2017  . Hypokalemia 06/16/2016  . Hip fracture, unspecified laterality, closed, initial encounter ([place]) 04/28/2016  . Ischemic cardiomyopathy 04/28/2016  . Hyperlipidemia 04/28/2016  . Cancer ([place]) 04/28/2016  . Hip fracture ([place]) 04/28/2016  . Dehydration 04/14/2016  . Orthostasis 02/24/2016  . Acute encephalopathy 02/24/2016  . Confusion 02/21/2016  . Neutropenia ([place]) 02/20/2016  . Thrombocytopenia ([place]) 02/20/2016  . ESBL (extended spectrum beta-lactamase) producing bacteria infection 02/20/2016  . Goals of care, counseling/discussion 02/02/2016  . Urothelial cancer ([place]) 01/01/2016  . Rectal bleeding 11/05/2014  . Absolute anemia 11/05/2014  . Hypothyroidism 11/05/2014  . Metabolic encephalopathy 11/91/4782  . Type 2 diabetes with nephropathy ([place]) 07/26/2014  . Essential hypertension 07/26/2014  . Hyponatremia 07/25/2014  . Vertigo 07/25/2014  . Acute kidney injury superimposed on chronic kidney disease ([place]) 12/13/2013  . Fall 12/13/2013  . Lower urinary tract infectious disease 12/13/2013  . Chronic kidney disease (CKD), stage IV (severe) ([place]) 12/13/2013  . Chest pain at rest 04/26/2011  . Syncope, near 04/26/2011  . CAD (coronary artery disease) 04/23/2011  . Cardiomyopathy, ischemic 04/23/2011  . Dyslipidemia 04/23/2011  . Myocardial infarction, anterior wall, initial care ([place]) 04/20/2011  Past Surgical History:  Procedure Laterality Date  . BACK SURGERY    . BREAST LUMPECTOMY     right  . CORONARY STENT PLACEMENT    . HIP ARTHROPLASTY Right 04/29/2016   Procedure: ARTHROPLASTY BIPOLAR HIP (HEMIARTHROPLASTY);  Surgeon: Carole Civil, MD;  Location: [facility];  Service: Orthopedics;  Laterality: Right;  . IR FLUORO GUIDE PORT INSERTION RIGHT  01/13/2017  . IR US GUIDE VASC ACCESS RIGHT  01/13/2017  . LEFT HEART CATHETERIZATION WITH  CORONARY ANGIOGRAM N/A 04/20/2011   Procedure: LEFT HEART CATHETERIZATION WITH CORONARY ANGIOGRAM;  Surgeon: Burnell Blanks, MD;  Location: [facility] C[facility] LAB;  Service: Cardiovascular;  Laterality: N/A;  . PERCUTANEOUS CORONARY STENT INTERVENTION (PCI-S) N/A 04/21/2011   Procedure: PERCUTANEOUS CORONARY STENT INTERVENTION (PCI-S);  Surgeon: Sherren Mocha, MD;  Location: [facility] CATH LAB;  Service: Cardiovascular;  Laterality: N/A;     OB History    Gravida  2   Para  2   Term  2   Preterm      AB      Living  2     SAB      TAB      Ectopic      Multiple      Live Births               Home Medications    Prior to Admission medications   Medication Sig Start Date End Date Taking? Authorizing Provider  aspirin EC 81 MG tablet Take 81 mg by mouth daily.    [provider]  cephALEXin (KEFLEX) 500 MG capsule Take 1 capsule (500 mg total) by mouth 4 (four) times daily. 05/04/17   Isla Pence, MD  doxycycline (VIBRA-TABS) 100 MG tablet  04/17/17   [provider]  feeding supplement, ENSURE ENLIVE, (ENSURE ENLIVE) LIQD Take 237 mLs by mouth 2 (two) times daily between meals. Patient taking differently: Take 237 mLs by mouth as needed.  05/02/16   Elgergawy, Silver Huguenin, MD  fish oil-omega-3 fatty acids 1000 MG capsule Take 1 g by mouth daily.    [provider]  fluticasone (FLONASE ALLERGY RELIEF) 50 MCG/ACT nasal spray Place 1 spray into the nose daily as needed for allergies. 01/30/15   [provider]  furosemide (LASIX) 20 MG tablet  04/09/17   [provider]  Gemcitabine HCl (GEMZAR IV) Inject into the vein. Day 1,8,15 every 28 days    [provider]  HYDROcodone-acetaminophen (NORCO/VICODIN) 5-325 MG tablet Take 1 tablet by mouth every 6 (six) hours as needed for moderate pain. 02/08/17   Kathie Dike, MD  levothyroxine (SYNTHROID, LEVOTHROID) 112 MCG tablet Take 1 tablet (112 mcg total) by mouth daily before breakfast.  09/08/16   Holley Bouche, NP  lidocaine-prilocaine (EMLA) cream Apply a quarter size amount to affected area 1 hour prior to coming to chemotherapy.  Do not rub in.  Cover with plastic wrap. 01/09/17   Jacquelin Hawking, NP  LORazepam (ATIVAN) 0.5 MG tablet Take 1 tablet by mouth as needed.  05/03/16   [provider]  meclizine (ANTIVERT) 25 MG tablet Take 25 mg by mouth 4 (four) times daily as needed. Dizziness    [provider]  metoprolol tartrate (LOPRESSOR) 25 MG tablet Take 12.5 mg by mouth 2 (two) times daily. 10/24/16   [provider]  nitroGLYCERIN (NITROSTAT) 0.4 MG SL tablet Place 0.4 mg under the tongue every 5 (five) minutes x 3 doses as needed. For chest  pain. 04/23/11   Dunn, Lisbeth Renshaw N, PA-C  ondansetron (ZOFRAN) 4 MG tablet Take 1 tablet (4 mg total) by mouth every 8 (eight) hours as needed for nausea or vomiting. 03/16/17   Thurnell Lose, MD  pantoprazole (PROTONIX) 40 MG tablet Take 1 tablet by mouth at bedtime. 09/29/16   [provider]  potassium chloride (K-DUR) 10 MEQ tablet Take 1 tablet (10 mEq total) by mouth daily. 03/16/17   Thurnell Lose, MD  tamsulosin (FLOMAX) 0.4 MG CAPS capsule Take 1 capsule (0.4 mg total) by mouth daily after supper. 03/16/17   Thurnell Lose, MD  traZODone (DESYREL) 50 MG tablet Take 1 tablet by mouth at bedtime. 05/31/16   [provider]    Family History Family History  Problem Relation Age of Onset  . Other Mother   . Cancer Father   . Other Unknown        no known family cardiac disease  . Other Brother        Diptheria  . Depression Sister   . Pneumonia Sister   . Other Brother        murdered    Social History Social History   Tobacco Use  . Smoking status: Never Smoker  . Smokeless tobacco: Never Used  Substance Use Topics  . Alcohol use: No  . Drug use: No     Allergies   Ciprofloxacin; Nitrofurantoin; Lipitor [atorvastatin]; and Penicillins   Review of  Systems Review of Systems  Unable to perform ROS: Dementia     Physical Exam Updated Vital Signs BP (!) 98/59   Pulse 63   Temp 98 F (36.7 C) (Oral)   Resp 15   Ht 5\' 2"  (1.575 m)   Wt 77.1 kg (170 lb)   SpO2 97%   BMI 31.09 kg/m   Physical Exam  Constitutional: She appears well-developed and well-nourished.  HENT:  Head: Normocephalic and atraumatic.  Right Ear: External ear normal.  Left Ear: External ear normal.  Nose: Nose normal.  Mouth/Throat: Oropharynx is clear and moist.  Eyes: Pupils are equal, round, and reactive to light. Conjunctivae and EOM are normal.  Neck: Normal range of motion. Neck supple.  Cardiovascular: Normal rate, regular rhythm, normal heart sounds and intact distal pulses.  Pulmonary/Chest: Effort normal and breath sounds normal.  Abdominal: Soft. Bowel sounds are normal.  Musculoskeletal: Normal range of motion.  Neurological: She is alert.  Pt knows her name.  She thinks it is April.  She does not know where she is.  Skin: Capillary refill takes less than 2 seconds.  Psychiatric: She has a normal mood and affect. Her speech is normal and behavior is normal. Cognition and memory are impaired.  + hallucinating in room  Nursing note and vitals reviewed.    ED Treatments / Results  Labs (all labs ordered are listed, but only abnormal results are displayed) Labs Reviewed  CBC WITH DIFFERENTIAL/PLATELET - Abnormal; Notable for the following components:      Result Value   RBC 3.37 (*)    Hemoglobin 10.6 (*)    HCT 32.3 (*)    All other components within normal limits  COMPREHENSIVE METABOLIC PANEL - Abnormal; Notable for the following components:   Sodium 132 (*)    Glucose, Bld 107 (*)    BUN 21 (*)    Creatinine, Ser 2.46 (*)    Total Protein 6.0 (*)    Albumin 2.8 (*)    ALT 8 (*)  GFR calc non Af Amer 17 (*)    GFR calc Af Amer 20 (*)    All other components within normal limits  URINALYSIS, ROUTINE W REFLEX MICROSCOPIC -  Abnormal; Notable for the following components:   APPearance HAZY (*)    Leukocytes, UA SMALL (*)    Bacteria, UA MANY (*)    All other components within normal limits  CBG MONITORING, ED - Abnormal; Notable for the following components:   Glucose-Capillary 104 (*)    All other components within normal limits  URINE CULTURE  CULTURE, BLOOD (ROUTINE X 2)  CULTURE, BLOOD (ROUTINE X 2)  TROPONIN I  I-STAT CG4 LACTIC ACID, ED    EKG None  Radiology Dg Chest 2 View  Result Date: 05/04/2017 CLINICAL DATA:  Altered mental state. EXAM: CHEST - 2 VIEW COMPARISON:  04/28/2017. FINDINGS: PowerPort catheter with tip over superior vena cava. Cardiomegaly with normal pulmonary vascularity. No focal infiltrate. Low lung volumes with left base subsegmental atelectasis. Small left pleural effusion. IMPRESSION: 1.  PowerPort catheter with tip over superior vena cava. 2.  Cardiomegaly with normal pulmonary vascularity. 3. Mild left base subsegmental atelectasis. Small left pleural effusion. Electronically Signed   By: Marcello Moores  Register   On: 05/04/2017 11:03   Ct Head Wo Contrast  Result Date: 05/04/2017 CLINICAL DATA:  Altered level of consciousness EXAM: CT HEAD WITHOUT CONTRAST TECHNIQUE: Contiguous axial images were obtained from the base of the skull through the vertex without intravenous contrast. COMPARISON:  MRI 03/13/2017 FINDINGS: Brain: There is atrophy and chronic small vessel disease changes. No acute intracranial abnormality. Specifically, no hemorrhage, hydrocephalus, mass lesion, acute infarction, or significant intracranial injury. Vascular: No hyperdense vessel or unexpected calcification. Skull: No acute calvarial abnormality. Sinuses/Orbits: Visualized paranasal sinuses and mastoids clear. Orbital soft tissues unremarkable. Other: None IMPRESSION: No acute intracranial abnormality. Atrophy, chronic microvascular disease. Electronically Signed   By: Rolm Baptise M.D.   On: 05/04/2017 10:59     Procedures Procedures (including critical care time)  Medications Ordered in ED Medications  sodium chloride 0.9 % bolus 1,000 mL (0 mLs Intravenous Stopped 05/04/17 1154)  cefTRIAXone (ROCEPHIN) 1 g in sodium chloride 0.9 % 100 mL IVPB (1 g Intravenous New Bag/Given 05/04/17 1200)     Initial Impression / Assessment and Plan / ED Course  I have reviewed the triage vital signs and the nursing notes.  Pertinent labs & imaging results that were available during my care of the patient were reviewed by me and considered in my medical decision making (see chart for details).    Urine does have many bacteria, but no nitrites or LE.  Urine will be sent for culture.  No other source of infection.  Pt d/w Dr. Dyann Kief (triad) for admission.  He thinks pt will do better at home with abx as her mental status may worsen in an unfamiliar environment.  She is not vomiting and is able to tolerate po fluids.  Pt's daughter is ok with this plan.  She knows to bring pt back if sx worsen.     Pt has been able to rest here and does seem to be a little better oriented.  Final Clinical Impressions(s) / ED Diagnoses   Final diagnoses:  Metabolic encephalopathy  Acute cystitis without hematuria  CKD (chronic kidney disease) stage 4, GFR 15-29 ml/min [facility])    ED Discharge Orders        Ordered    cephALEXin (KEFLEX) 500 MG capsule  4 times daily  05/04/17 1252       Isla Pence, MD 05/04/17 1255

## 2017-05-05 LAB — URINE CULTURE: CULTURE: NO GROWTH

## 2017-05-09 LAB — CULTURE, BLOOD (ROUTINE X 2)
CULTURE: NO GROWTH
CULTURE: NO GROWTH
Special Requests: ADEQUATE

## 2017-05-26 ENCOUNTER — Inpatient Hospital Stay (HOSPITAL_COMMUNITY)
Admission: EM | Admit: 2017-05-26 | Discharge: 2017-05-28 | DRG: 071 | Disposition: A | Discharge status: Home Health | Payer: Medicare Other | Attending: Internal Medicine | Admitting: Internal Medicine

## 2017-05-26 ENCOUNTER — Encounter (HOSPITAL_COMMUNITY): Payer: Self-pay

## 2017-05-26 ENCOUNTER — Emergency Department (HOSPITAL_COMMUNITY): Payer: Medicare Other

## 2017-05-26 ENCOUNTER — Inpatient Hospital Stay (HOSPITAL_COMMUNITY): Payer: Medicare Other

## 2017-05-26 ENCOUNTER — Other Ambulatory Visit: Payer: Self-pay

## 2017-05-26 DIAGNOSIS — Z96641 Presence of right artificial hip joint: Secondary | ICD-10-CM | POA: Diagnosis present

## 2017-05-26 DIAGNOSIS — F0390 Unspecified dementia without behavioral disturbance: Secondary | ICD-10-CM | POA: Diagnosis present

## 2017-05-26 DIAGNOSIS — E1121 Type 2 diabetes mellitus with diabetic nephropathy: Secondary | ICD-10-CM | POA: Diagnosis present

## 2017-05-26 DIAGNOSIS — C689 Malignant neoplasm of urinary organ, unspecified: Secondary | ICD-10-CM | POA: Diagnosis present

## 2017-05-26 DIAGNOSIS — Z955 Presence of coronary angioplasty implant and graft: Secondary | ICD-10-CM | POA: Diagnosis not present

## 2017-05-26 DIAGNOSIS — R410 Disorientation, unspecified: Secondary | ICD-10-CM

## 2017-05-26 DIAGNOSIS — N184 Chronic kidney disease, stage 4 (severe): Secondary | ICD-10-CM | POA: Diagnosis present

## 2017-05-26 DIAGNOSIS — E871 Hypo-osmolality and hyponatremia: Secondary | ICD-10-CM | POA: Diagnosis present

## 2017-05-26 DIAGNOSIS — I951 Orthostatic hypotension: Secondary | ICD-10-CM | POA: Diagnosis not present

## 2017-05-26 DIAGNOSIS — D649 Anemia, unspecified: Secondary | ICD-10-CM | POA: Diagnosis present

## 2017-05-26 DIAGNOSIS — Z79899 Other long term (current) drug therapy: Secondary | ICD-10-CM

## 2017-05-26 DIAGNOSIS — I2510 Atherosclerotic heart disease of native coronary artery without angina pectoris: Secondary | ICD-10-CM | POA: Diagnosis present

## 2017-05-26 DIAGNOSIS — C7919 Secondary malignant neoplasm of other urinary organs: Secondary | ICD-10-CM | POA: Diagnosis present

## 2017-05-26 DIAGNOSIS — N183 Chronic kidney disease, stage 3 unspecified: Secondary | ICD-10-CM | POA: Diagnosis present

## 2017-05-26 DIAGNOSIS — I252 Old myocardial infarction: Secondary | ICD-10-CM | POA: Diagnosis not present

## 2017-05-26 DIAGNOSIS — E538 Deficiency of other specified B group vitamins: Secondary | ICD-10-CM | POA: Diagnosis present

## 2017-05-26 DIAGNOSIS — E785 Hyperlipidemia, unspecified: Secondary | ICD-10-CM | POA: Diagnosis present

## 2017-05-26 DIAGNOSIS — G9341 Metabolic encephalopathy: Secondary | ICD-10-CM | POA: Diagnosis present

## 2017-05-26 DIAGNOSIS — N390 Urinary tract infection, site not specified: Secondary | ICD-10-CM | POA: Diagnosis present

## 2017-05-26 DIAGNOSIS — E860 Dehydration: Secondary | ICD-10-CM | POA: Diagnosis present

## 2017-05-26 DIAGNOSIS — E861 Hypovolemia: Secondary | ICD-10-CM | POA: Diagnosis present

## 2017-05-26 DIAGNOSIS — E11649 Type 2 diabetes mellitus with hypoglycemia without coma: Secondary | ICD-10-CM | POA: Diagnosis present

## 2017-05-26 DIAGNOSIS — I130 Hypertensive heart and chronic kidney disease with heart failure and stage 1 through stage 4 chronic kidney disease, or unspecified chronic kidney disease: Secondary | ICD-10-CM | POA: Diagnosis present

## 2017-05-26 DIAGNOSIS — N179 Acute kidney failure, unspecified: Secondary | ICD-10-CM | POA: Diagnosis present

## 2017-05-26 DIAGNOSIS — I255 Ischemic cardiomyopathy: Secondary | ICD-10-CM | POA: Diagnosis present

## 2017-05-26 DIAGNOSIS — E1122 Type 2 diabetes mellitus with diabetic chronic kidney disease: Secondary | ICD-10-CM | POA: Diagnosis present

## 2017-05-26 DIAGNOSIS — I5032 Chronic diastolic (congestive) heart failure: Secondary | ICD-10-CM | POA: Diagnosis present

## 2017-05-26 DIAGNOSIS — N170 Acute kidney failure with tubular necrosis: Secondary | ICD-10-CM | POA: Diagnosis not present

## 2017-05-26 DIAGNOSIS — Z79890 Hormone replacement therapy (postmenopausal): Secondary | ICD-10-CM

## 2017-05-26 DIAGNOSIS — I10 Essential (primary) hypertension: Secondary | ICD-10-CM | POA: Diagnosis present

## 2017-05-26 DIAGNOSIS — N189 Chronic kidney disease, unspecified: Secondary | ICD-10-CM | POA: Diagnosis not present

## 2017-05-26 DIAGNOSIS — G9340 Encephalopathy, unspecified: Secondary | ICD-10-CM

## 2017-05-26 DIAGNOSIS — Z85528 Personal history of other malignant neoplasm of kidney: Secondary | ICD-10-CM

## 2017-05-26 DIAGNOSIS — E039 Hypothyroidism, unspecified: Secondary | ICD-10-CM | POA: Diagnosis present

## 2017-05-26 DIAGNOSIS — Z9221 Personal history of antineoplastic chemotherapy: Secondary | ICD-10-CM

## 2017-05-26 DIAGNOSIS — Z7982 Long term (current) use of aspirin: Secondary | ICD-10-CM

## 2017-05-26 HISTORY — DX: Unspecified dementia, unspecified severity, without behavioral disturbance, psychotic disturbance, mood disturbance, and anxiety: F03.90

## 2017-05-26 LAB — CBC WITH DIFFERENTIAL/PLATELET
BASOS PCT: 0 %
Basophils Absolute: 0 10*3/uL (ref 0.0–0.1)
EOS ABS: 0.2 10*3/uL (ref 0.0–0.7)
EOS PCT: 6 %
HEMATOCRIT: 27.8 % — AB (ref 36.0–46.0)
Hemoglobin: 9.3 g/dL — ABNORMAL LOW (ref 12.0–15.0)
Lymphocytes Relative: 19 %
Lymphs Abs: 0.7 10*3/uL (ref 0.7–4.0)
MCH: 32.2 pg (ref 26.0–34.0)
MCHC: 33.5 g/dL (ref 30.0–36.0)
MCV: 96.2 fL (ref 78.0–100.0)
MONO ABS: 0.6 10*3/uL (ref 0.1–1.0)
MONOS PCT: 16 %
Neutro Abs: 2.1 10*3/uL (ref 1.7–7.7)
Neutrophils Relative %: 59 %
Platelets: 147 10*3/uL — ABNORMAL LOW (ref 150–400)
RBC: 2.89 MIL/uL — ABNORMAL LOW (ref 3.87–5.11)
RDW: 14.4 % (ref 11.5–15.5)
WBC: 3.6 10*3/uL — ABNORMAL LOW (ref 4.0–10.5)

## 2017-05-26 LAB — URINALYSIS, ROUTINE W REFLEX MICROSCOPIC
BILIRUBIN URINE: NEGATIVE
GLUCOSE, UA: NEGATIVE mg/dL
HGB URINE DIPSTICK: NEGATIVE
KETONES UR: NEGATIVE mg/dL
LEUKOCYTES UA: NEGATIVE
Nitrite: NEGATIVE
PH: 5 (ref 5.0–8.0)
PROTEIN: NEGATIVE mg/dL
Specific Gravity, Urine: 1.01 (ref 1.005–1.030)

## 2017-05-26 LAB — COMPREHENSIVE METABOLIC PANEL
ALK PHOS: 53 U/L (ref 38–126)
ALT: 7 U/L — AB (ref 14–54)
AST: 16 U/L (ref 15–41)
Albumin: 2.5 g/dL — ABNORMAL LOW (ref 3.5–5.0)
Anion gap: 7 (ref 5–15)
BUN: 21 mg/dL — ABNORMAL HIGH (ref 6–20)
CALCIUM: 9 mg/dL (ref 8.9–10.3)
CO2: 23 mmol/L (ref 22–32)
CREATININE: 2.68 mg/dL — AB (ref 0.44–1.00)
Chloride: 100 mmol/L — ABNORMAL LOW (ref 101–111)
GFR, EST AFRICAN AMERICAN: 18 mL/min — AB (ref >60)
GFR, EST NON AFRICAN AMERICAN: 15 mL/min — AB (ref >60)
Glucose, Bld: 93 mg/dL (ref 65–99)
Potassium: 4.4 mmol/L (ref 3.5–5.1)
Sodium: 130 mmol/L — ABNORMAL LOW (ref 135–145)
TOTAL PROTEIN: 5.7 g/dL — AB (ref 6.5–8.1)
Total Bilirubin: 1 mg/dL (ref 0.3–1.2)

## 2017-05-26 LAB — PROCALCITONIN: Procalcitonin: <0.1 ng/mL

## 2017-05-26 LAB — TSH: TSH: 0.21 u[IU]/mL — ABNORMAL LOW (ref 0.350–4.500)

## 2017-05-26 LAB — LACTIC ACID, PLASMA
LACTIC ACID, VENOUS: 1.1 mmol/L (ref 0.5–1.9)
Lactic Acid, Venous: 0.9 mmol/L (ref 0.5–1.9)

## 2017-05-26 LAB — TROPONIN I

## 2017-05-26 MED ORDER — TRAZODONE HCL 50 MG PO TABS
50.0000 mg | ORAL_TABLET | Freq: Every day | ORAL | Status: DC
  Filled 2017-05-26 (×2): qty 1

## 2017-05-26 MED ORDER — SODIUM CHLORIDE 0.9 % IV SOLN
INTRAVENOUS | Status: DC
  Administered 2017-05-26 – 2017-05-28 (×4): via INTRAVENOUS

## 2017-05-26 MED ORDER — OMEGA-3-ACID ETHYL ESTERS 1 G PO CAPS
1.0000 g | ORAL_CAPSULE | Freq: Every day | ORAL | Status: DC
  Administered 2017-05-27 – 2017-05-28 (×2): 1 g via ORAL
  Filled 2017-05-26 (×3): qty 1

## 2017-05-26 MED ORDER — ONDANSETRON HCL 4 MG PO TABS: 4.0000 mg | ORAL_TABLET | Freq: Four times a day (QID) | ORAL | Status: DC | PRN

## 2017-05-26 MED ORDER — ONDANSETRON HCL 4 MG/2ML IJ SOLN: 4.0000 mg | Freq: Four times a day (QID) | INTRAMUSCULAR | Status: DC | PRN

## 2017-05-26 MED ORDER — LORAZEPAM 0.5 MG PO TABS: 0.5000 mg | ORAL_TABLET | Freq: Four times a day (QID) | ORAL | Status: DC | PRN

## 2017-05-26 MED ORDER — ASPIRIN EC 81 MG PO TBEC
81.0000 mg | DELAYED_RELEASE_TABLET | Freq: Every day | ORAL | Status: DC
  Administered 2017-05-27 – 2017-05-28 (×2): 81 mg via ORAL
  Filled 2017-05-26 (×3): qty 1

## 2017-05-26 MED ORDER — TAMSULOSIN HCL 0.4 MG PO CAPS
0.4000 mg | ORAL_CAPSULE | Freq: Every day | ORAL | Status: DC
  Administered 2017-05-27: 0.4 mg via ORAL
  Filled 2017-05-26 (×2): qty 1

## 2017-05-26 MED ORDER — ACETAMINOPHEN 650 MG RE SUPP: 650.0000 mg | Freq: Four times a day (QID) | RECTAL | Status: DC | PRN

## 2017-05-26 MED ORDER — SODIUM CHLORIDE 0.9 % IV BOLUS
250.0000 mL | Freq: Once | INTRAVENOUS | Status: AC
  Administered 2017-05-26: 250 mL via INTRAVENOUS

## 2017-05-26 MED ORDER — ACETAMINOPHEN 325 MG PO TABS: 650.0000 mg | ORAL_TABLET | Freq: Four times a day (QID) | ORAL | Status: DC | PRN

## 2017-05-26 MED ORDER — PANTOPRAZOLE SODIUM 40 MG PO TBEC
40.0000 mg | DELAYED_RELEASE_TABLET | Freq: Every day | ORAL | Status: DC
  Filled 2017-05-26 (×2): qty 1

## 2017-05-26 MED ORDER — HEPARIN SODIUM (PORCINE) 5000 UNIT/ML IJ SOLN
5000.0000 [IU] | Freq: Three times a day (TID) | INTRAMUSCULAR | Status: DC
  Administered 2017-05-26 – 2017-05-28 (×6): 5000 [IU] via SUBCUTANEOUS
  Filled 2017-05-26 (×6): qty 1

## 2017-05-26 MED ORDER — MECLIZINE HCL 12.5 MG PO TABS: 25.0000 mg | ORAL_TABLET | Freq: Four times a day (QID) | ORAL | Status: DC | PRN

## 2017-05-26 MED ORDER — TRAMADOL HCL 50 MG PO TABS
50.0000 mg | ORAL_TABLET | Freq: Four times a day (QID) | ORAL | Status: DC | PRN
  Administered 2017-05-27: 50 mg via ORAL
  Filled 2017-05-26: qty 1

## 2017-05-26 MED ORDER — LEVOTHYROXINE SODIUM 112 MCG PO TABS
112.0000 ug | ORAL_TABLET | Freq: Every day | ORAL | Status: DC
  Administered 2017-05-27: 112 ug via ORAL
  Filled 2017-05-26 (×4): qty 1

## 2017-05-26 MED ORDER — POLYETHYLENE GLYCOL 3350 17 G PO PACK: 17.0000 g | PACK | Freq: Every day | ORAL | Status: DC | PRN

## 2017-05-26 NOTE — ED Notes (Signed)
To CT

## 2017-05-26 NOTE — ED Notes (Signed)
Call for report 

## 2017-05-26 NOTE — ED Triage Notes (Addendum)
Pt brought in by EMS due to AMS for 4 days. Pt lives with daughter. EMS states daughter reports she was seen by PCP and dx with UTI started on Levaquin. Pt has not slept in 3 days. Continually talking and trying to get out of bed. Visual hallucinations Pt with difficulty standing per EMS

## 2017-05-26 NOTE — ED Notes (Signed)
Call for report- terminal hold

## 2017-05-26 NOTE — ED Notes (Signed)
Pt not able to complete standing part of orthostatic vitals.

## 2017-05-26 NOTE — Progress Notes (Signed)
Patient asleep this evening, did not want supper. Daughter Tye [place] at bedside preferred patient not be woken up for afternoon/evening medications. Text-paged MD to notify. Donavan Foil, RN

## 2017-05-26 NOTE — ED Notes (Signed)
Call for report Nurse doing DC will call back

## 2017-05-26 NOTE — H&P (Signed)
History and Physical    Joann Hunter WGN:[id number] DOB: Apr 25, 1932 DOA: 05/26/2017  PCP: Monico Blitz, MD   Patient coming from: home    Chief Complaint: AMS  HPI: Joann Hunter is a 84 y.o. female with medical history significant of coronary artery disease status post NSTEMI status post PCI 7846, chronic diastolic heart failure by echo on 01/22/2017, dementia, hypertension, hyperlipidemia, hypothyroidism, metastatic urothelial cell carcinoma of the right kidney with most recent chemotherapy gemcitabine on 01/2017, stage IV chronic kidney disease who presents to the emergency department with altered mental status for 1 week.  Per discussion with the daughter is the patient cannot participate in the exam she reports that the patient was doing well until approximately 5 days ago when she began to have hallucinations and delusions and start speaking to people who were not there.  She was noted to be quite fidgety and anxious and would not sleep throughout the night.  Patient was taken to her PCP about 4 days ago and based on bacteriuria was diagnosed with a possible UTI and started on levofloxacin.  Patient did not improve and if anything became worse on the levofloxacin per the daughter.  Patient began stop sleeping completely and would spend all night talking to person who were not present.  She would often not be able to hold a conversation which she normally could.  She was also noted to be progressively weaker and would often just lie in bed or in a chair and started to become unable to ambulate.  The daughter tried giving the patient her as needed lorazepam without much improvement in her agitation.  Otherwise patient has not been noted to have any fevers, dyspnea, shortness of breath, nausea, vomiting, abdominal pain, cough, congestion, rhinorrhea, diarrhea.  ED Course: In the ED patient's vitals were stable.  CBC showed a white count of 9.3 which is around her baseline.  Patient was noted to  have slightly low platelets at 147 which she has had in the past as well as slightly low white count of 3.6 which is also similar to that in the past.  CMP showed chronic hyponatremia at 130.  Creatinine was 2.68 which is an increase in prior baseline is around 1.6-1.9.  Patient's albumin was 2.5 however calcium was normal suggesting mild hypercalcemia.  LFTs were normal.  Troponin was negative.  CT head showed only chronic microvascular ischemic changes.  X-ray showed chronic trace left-sided pleural effusion with left basilar opacities that is unchanged from prior.  Patient was given IV fluids and was noted to be unable to ambulate.  Review of Systems: As per HPI otherwise 10 point review of systems negative.    Past Medical History:  Diagnosis Date  . Arthritis   . Back pain, chronic   . CAD (coronary artery disease)    NSTEMI 04/2011 with newly diagnosed 3V CAD s/p PCI/DES mLAD 04/11/11 with residual dz (per Dr. Burt Knack, should have consideration of PCI of the occluded RCA based on symptoms and/or consideration of outpatient nuclear perfusion testing after the patient recovers from her infarct)  . Cancer ([place])    kidney ( Nov.2017)  . Chronic kidney disease (CKD), stage IV (severe) ([place]) 12/13/2013  . Dementia   . Hyperglycemia   . Hyperlipidemia   . Hypertension   . Hypothyroidism   . Ischemic cardiomyopathy    EF 45% by cath 04/20/11, 50-55% by echo 04/22/11. hypotension limiting medication titration   . Myocardial infarction ([place]) 2013  . Type  2 diabetes with nephropathy ([place]) 07/26/2014  . UTI (urinary tract infection) 12/13/2013  . Vertigo     Past Surgical History:  Procedure Laterality Date  . BACK SURGERY    . BREAST LUMPECTOMY     right  . CORONARY STENT PLACEMENT    . HIP ARTHROPLASTY Right 04/29/2016   Procedure: ARTHROPLASTY BIPOLAR HIP (HEMIARTHROPLASTY);  Surgeon: Carole Civil, MD;  Location: [facility];  Service: Orthopedics;  Laterality: Right;  . IR FLUORO GUIDE PORT  INSERTION RIGHT  01/13/2017  . IR US GUIDE VASC ACCESS RIGHT  01/13/2017  . LEFT HEART CATHETERIZATION WITH CORONARY ANGIOGRAM N/A 04/20/2011   Procedure: LEFT HEART CATHETERIZATION WITH CORONARY ANGIOGRAM;  Surgeon: Burnell Blanks, MD;  Location: [facility] CATH LAB;  Service: Cardiovascular;  Laterality: N/A;  . PERCUTANEOUS CORONARY STENT INTERVENTION (PCI-S) N/A 04/21/2011   Procedure: PERCUTANEOUS CORONARY STENT INTERVENTION (PCI-S);  Surgeon: Sherren Mocha, MD;  Location: [facility] CATH LAB;  Service: Cardiovascular;  Laterality: N/A;     reports that she has never smoked. She has never used smokeless tobacco. She reports that she does not drink alcohol or use drugs.  Allergies  Allergen Reactions  . Ciprofloxacin Other (See Comments)    Hallucination  . Nitrofurantoin Other (See Comments)    Dizziness: Resulting in a fall  . Lipitor [Atorvastatin] Other (See Comments)    Muscle Weakness  . Penicillins Rash    Has patient had a PCN reaction causing immediate rash, facial/tongue/throat swelling, SOB or lightheadedness with hypotension: unknown Has patient had a PCN reaction causing severe rash involving mucus membranes or skin necrosis: unknown Has patient had a PCN reaction that required hospitalization: unknown Has patient had a PCN reaction occurring within the last 10 years: unknown If all of the above answers are "NO", then may proceed with Cephalosporin use. 2    Family History  Problem Relation Age of Onset  . Other Mother   . Cancer Father   . Other Unknown        no known family cardiac disease  . Other Brother        Diptheria  . Depression Sister   . Pneumonia Sister   . Other Brother        murdered    Prior to Admission medications   Medication Sig Start Date End Date Taking? Authorizing Provider  aspirin EC 81 MG tablet Take 81 mg by mouth daily.   Yes [provider]  doxycycline (VIBRA-TABS) 100 MG tablet Take 100 mg by mouth daily.  04/17/17  Yes  [provider]  feeding supplement, ENSURE ENLIVE, (ENSURE ENLIVE) LIQD Take 237 mLs by mouth 2 (two) times daily between meals. Patient taking differently: Take 237 mLs by mouth as needed.  05/02/16  Yes Elgergawy, Silver Huguenin, MD  fish oil-omega-3 fatty acids 1000 MG capsule Take 1 g by mouth daily.   Yes [provider]  furosemide (LASIX) 20 MG tablet Take 20 mg by mouth daily.  04/09/17  Yes [provider]  Gemcitabine HCl (GEMZAR IV) Inject into the vein. Day 1,8,15 every 28 days   Yes [provider]  HYDROcodone-acetaminophen (NORCO/VICODIN) 5-325 MG tablet Take 1 tablet by mouth every 6 (six) hours as needed for moderate pain. 02/08/17  Yes Kathie Dike, MD  levofloxacin (LEVAQUIN) 500 MG tablet Take 500 mg by mouth daily. For  Seven days 05/23/17  Yes [provider]  levothyroxine (SYNTHROID, LEVOTHROID) 112 MCG tablet Take 1 tablet (112 mcg total) by mouth  daily before breakfast. 09/08/16  Yes Holley Bouche, NP  lidocaine-prilocaine (EMLA) cream Apply a quarter size amount to affected area 1 hour prior to coming to chemotherapy.  Do not rub in.  Cover with plastic wrap. 01/09/17  Yes Burns, Wandra Feinstein, NP  LORazepam (ATIVAN) 0.5 MG tablet Take 1 tablet by mouth as needed.  05/03/16  Yes [provider]  meclizine (ANTIVERT) 25 MG tablet Take 25 mg by mouth 4 (four) times daily as needed. Dizziness   Yes [provider]  metoprolol tartrate (LOPRESSOR) 25 MG tablet Take 12.5 mg by mouth 2 (two) times daily. 10/24/16  Yes [provider]  nitroGLYCERIN (NITROSTAT) 0.4 MG SL tablet Place 0.4 mg under the tongue every 5 (five) minutes x 3 doses as needed. For chest pain. 04/23/11  Yes Dunn, Dayna N, PA-C  ondansetron (ZOFRAN) 4 MG tablet Take 1 tablet (4 mg total) by mouth every 8 (eight) hours as needed for nausea or vomiting. 03/16/17  Yes Thurnell Lose, MD  pantoprazole (PROTONIX) 40 MG tablet Take 1 tablet by mouth at  bedtime. 09/29/16  Yes [provider]  potassium chloride (K-DUR) 10 MEQ tablet Take 1 tablet (10 mEq total) by mouth daily. 03/16/17  Yes Thurnell Lose, MD  tamsulosin (FLOMAX) 0.4 MG CAPS capsule Take 1 capsule (0.4 mg total) by mouth daily after supper. 03/16/17  Yes Thurnell Lose, MD  traZODone (DESYREL) 50 MG tablet Take 1 tablet by mouth at bedtime. 05/31/16  Yes [provider]  cephALEXin (KEFLEX) 500 MG capsule Take 1 capsule (500 mg total) by mouth 4 (four) times daily. Patient not taking: Reported on 05/26/2017 05/04/17   Isla Pence, MD    Physical Exam: Vitals:   05/26/17 0800 05/26/17 0801 05/26/17 0802 05/26/17 0830  BP: (!) 90/59 (!) 92/34 95/83 (!) 110/98  Pulse: 63 63 60 62  Resp: 17 17 16 15   Temp:      TempSrc:      SpO2: 100% 100% 97% 100%  Weight:        Constitutional: NAD, calm, comfortable Vitals:   05/26/17 0800 05/26/17 0801 05/26/17 0802 05/26/17 0830  BP: (!) 90/59 (!) 92/34 95/83 (!) 110/98  Pulse: 63 63 60 62  Resp: 17 17 16 15   Temp:      TempSrc:      SpO2: 100% 100% 97% 100%  Weight:       Eyes: Anicteric sclera, pupils equally round and reactive to light ENMT: Dry mucous membranes, edentulous Neck: normal, supple Respiratory: clear to auscultation bilaterally, no wheezing, no crackles. Normal respiratory effort. No accessory muscle use.  Cardiovascular: Distant heart sounds, regular rate and rhythm, no murmurs.  Abdomen: Mildly tender to deep palpation, diminished bowel sounds, no rebound or guarding Musculoskeletal: No lower extremity edema Skin: Port site in right upper chest is clean dry intact Neurologic: Grossly intact, moving all extremities, no focal weakness or deficit.  Psychiatric: Unable to assess due to mental status   Labs on Admission: I have personally reviewed following labs and imaging studies  CBC: Recent Labs  Lab 05/26/17 0805  WBC 3.6*  NEUTROABS 2.1  HGB 9.3*  HCT 27.8*  MCV 96.2    PLT 601*   Basic Metabolic Panel: Recent Labs  Lab 05/26/17 0805  NA 130*  K 4.4  CL 100*  CO2 23  GLUCOSE 93  BUN 21*  CREATININE 2.68*  CALCIUM 9.0   GFR: Estimated Creatinine Clearance: 15.3 mL/min (A) (by  C-G formula based on SCr of 2.68 mg/dL (H)). Liver Function Tests: Recent Labs  Lab 05/26/17 0805  AST 16  ALT 7*  ALKPHOS 53  BILITOT 1.0  PROT 5.7*  ALBUMIN 2.5*   No results for input(s): LIPASE, AMYLASE in the last 168 hours. No results for input(s): AMMONIA in the last 168 hours. Coagulation Profile: No results for input(s): INR, PROTIME in the last 168 hours. Cardiac Enzymes: Recent Labs  Lab 05/26/17 0805  TROPONINI <0.03   BNP (last 3 results) No results for input(s): PROBNP in the last 8760 hours. HbA1C: No results for input(s): HGBA1C in the last 72 hours. CBG: No results for input(s): GLUCAP in the last 168 hours. Lipid Profile: No results for input(s): CHOL, HDL, LDLCALC, TRIG, CHOLHDL, LDLDIRECT in the last 72 hours. Thyroid Function Tests: No results for input(s): TSH, T4TOTAL, FREET4, T3FREE, THYROIDAB in the last 72 hours. Anemia Panel: No results for input(s): VITAMINB12, FOLATE, FERRITIN, TIBC, IRON, RETICCTPCT in the last 72 hours. Urine analysis:    Component Value Date/Time   COLORURINE YELLOW 05/26/2017 0729   APPEARANCEUR CLEAR 05/26/2017 0729   LABSPEC 1.010 05/26/2017 0729   PHURINE 5.0 05/26/2017 0729   GLUCOSEU NEGATIVE 05/26/2017 0729   HGBUR NEGATIVE 05/26/2017 0729   BILIRUBINUR NEGATIVE 05/26/2017 0729   KETONESUR NEGATIVE 05/26/2017 0729   PROTEINUR NEGATIVE 05/26/2017 0729   UROBILINOGEN 0.2 11/05/2014 2000   NITRITE NEGATIVE 05/26/2017 0729   LEUKOCYTESUR NEGATIVE 05/26/2017 0729    Radiological Exams on Admission: Dg Chest 2 View  Result Date: 05/26/2017 CLINICAL DATA:  Altered mental status and weakness for the past 4 days. History of renal cell carcinoma. EXAM: CHEST - 2 VIEW COMPARISON:  05/04/2017;  04/28/2017; 01/28/2017; chest CT-12/23/2016 FINDINGS: Grossly unchanged cardiac silhouette and mediastinal contours with atherosclerotic plaque within the thoracic aorta. Post coronary stent placement. Stable positioning of support apparatus. Grossly unchanged trace left-sided effusion and associated left basilar opacities. No new focal airspace opacities. No definite evidence of edema. No acute osseus abnormalities. IMPRESSION: Unchanged trace left-sided effusion associated left basilar opacities, atelectasis versus infiltrate. Electronically Signed   By: Sandi Mariscal M.D.   On: 05/26/2017 09:16   Ct Head Wo Contrast  Result Date: 05/26/2017 CLINICAL DATA:  Altered level of consciousness EXAM: CT HEAD WITHOUT CONTRAST TECHNIQUE: Contiguous axial images were obtained from the base of the skull through the vertex without intravenous contrast. COMPARISON:  05/04/2017 FINDINGS: Brain: Diffuse atrophic and chronic white matter ischemic changes are noted. No findings to suggest acute hemorrhage, acute infarction or space-occupying mass lesion are noted. Vascular: No hyperdense vessel or unexpected calcification. Skull: Normal. Negative for fracture or focal lesion. Sinuses/Orbits: No acute finding. Other: None. IMPRESSION: Chronic atrophic and ischemic changes without acute abnormality. Electronically Signed   By: Inez Catalina M.D.   On: 05/26/2017 09:05    EKG: Independently reviewed.  Sinus rhythm, grade 1 AV block, no acute ST segment changes, unchanged from prior  Assessment/Plan Active Problems:   CAD (coronary artery disease)   Acute kidney injury superimposed on chronic kidney disease (HCC)   Chronic kidney disease (CKD), stage IV (severe) (HCC)   Hyponatremia   Essential hypertension   Metabolic encephalopathy   Absolute anemia   Hypothyroidism   Urothelial cancer (HCC)   Orthostasis   Acute encephalopathy   Ischemic cardiomyopathy   Hyperlipidemia   Hypercalcemia of malignancy   #)  Altered mental status: At this time suspect that she has a metabolic reason for encephalopathy.  There  is no evidence of an ischemic event at this time.  She is had a recent MRI that showed only chronic small vessel ischemic changes.  Other causes on the differential include hypercalcemia as based on her low albumin and her normal calcium she might be hypercalcemic, dehydration from overdiuresis from her diuretics, possible infection though her urine and chest x-ray are clear, overmedication.  A final cause could be that she did have a UTI causing mild altered mental status and this was subsequently treated with levofloxacin however as this does cross the blood-brain barrier and does activate Receptors for levofloxacin itself could have been causing fluoroquinolone induced altered mental status. - Hold antibiotics at this time -Procalcitonin ordered - check TSH -Continue PRN lorazepam as withdrawal could cause her altered mental status -Hold diuretics and gentle IV hydration - Ionized calcium ordered -Physical therapy consult  #) Acute on chronic kidney injury: Per review of the chart for the past month her creatinine has slowly been creeping up to now 2.6.  It is unclear if this is related to dehydration or progressive damage to her kidneys from the hypercalcemia.  Even more concerning is whether this is a sign of progression of her urothelial cancer.  She also does have some diffuse nonspecific abdominal pain that is hard to characterize however she has no abdominal complaints at this time. 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   -hold metoprolol tartrate 12.5 mg twice daily due to soft blood pressures -Continue aspirin 81 mg  #)  Hypothyroidism: -Continue levothyroxine 112 mcg every morning - TSH pending  #)grade 1 diastolic dysfunction/chronic diastolic heart failure: -Hold furosemide -Gentle IV hydration  #) Metastatic urothelial cell cancer: Apparently patient is failed outpatient immunotherapy and is only gotten 1 dose of gemcitabine before being in and out of the hospital and skilled nursing facilities.  Her prognosis is quite poor however her daughter would still like her to continue to attempt treatment. -CT abdomen pelvis pending next line-consider palliative care consult  Fluids: Gentle IV fluids Electrolytes: Monitor and supplement Nutrition: Regular diet  Prophylaxis: Subcu heparin  Disposition: Pending improved altered mental status and PT evaluation  Full code   Cristy Folks MD [facility]   If 7PM-7AM, please contact night-coverage [email or web address] Password [facility]  05/26/2017, 10:37 AM

## 2017-05-26 NOTE — ED Notes (Signed)
Pt not able to ambulate at this time, daughter states she usually uses a wheelchair at home.

## 2017-05-26 NOTE — Progress Notes (Signed)
Patient admitted with altered mental status, increased confusion at home over the past few days per daughter - Leiann Sporer report. Reports patient lives at home with her. Discussed fall prevention/safety plan education with Tye [place]. Verbalized understanding. Pt asleep and will require reinforcement of education. bedalarm on for safety, fall mats in use, call light and personal items kept within reach. Donavan Foil, RN

## 2017-05-26 NOTE — ED Notes (Signed)
awaiting bed assignment and transport

## 2017-05-26 NOTE — ED Notes (Signed)
Pt awaiting room assignment

## 2017-05-26 NOTE — ED Notes (Signed)
Report to Ashley, RN

## 2017-05-26 NOTE — ED Provider Notes (Signed)
[facility] EMERGENCY DEPARTMENT Provider Note   CSN: [id number] Arrival date & time: 05/26/17  0706     History   Chief Complaint Chief Complaint  Patient presents with  . Altered Mental Status    HPI Joann Hunter is a 84 y.o. female.  The history is provided by the patient, a relative, a caregiver and the EMS personnel. The history is limited by the condition of the patient (Hx dementia).  Altered Mental Status    Pt was seen at 0730. Per EMS, pt's daughter, and pt: Pt lives with daughter who states pt has been more confused than usual for the past 4 days. Pt was evaluated by PMD 3 days ago, dx UTI, rx levaquin. Pt's daughter states pt has "been getting worse." States she has had hallucinations, talking to herself, and today was unable to stand due to generalized weakness. Denies fevers, no vomiting/diarrhea, no SOB/cough.    Past Medical History:  Diagnosis Date  . Arthritis   . Back pain, chronic   . CAD (coronary artery disease)    NSTEMI 04/2011 with newly diagnosed 3V CAD s/p PCI/DES mLAD 04/11/11 with residual dz (per Dr. Burt Knack, should have consideration of PCI of the occluded RCA based on symptoms and/or consideration of outpatient nuclear perfusion testing after the patient recovers from her infarct)  . Cancer ([place])    kidney ( Nov.2017)  . Chronic kidney disease (CKD), stage IV (severe) ([place]) 12/13/2013  . Dementia   . Hyperglycemia   . Hyperlipidemia   . Hypertension   . Hypothyroidism   . Ischemic cardiomyopathy    EF 45% by cath 04/20/11, 50-55% by echo 04/22/11. hypotension limiting medication titration   . Myocardial infarction ([place]) 2013  . Type 2 diabetes with nephropathy ([place]) 07/26/2014  . UTI (urinary tract infection) 12/13/2013  . Vertigo     Patient Active Problem List   Diagnosis Date Noted  . DNR (do not resuscitate) discussion   . Palliative care by specialist   . Syncope   . Acute renal failure superimposed on stage 4 chronic kidney disease  ([place]) 03/11/2017  . Vasovagal syncope 03/11/2017  . Hypercalcemia of malignancy 03/11/2017  . Acute kidney injury ([place]) 01/29/2017  . Pressure injury of skin 01/23/2017  . Pain and swelling of left lower leg 01/21/2017  . Hypokalemia 06/16/2016  . Hip fracture, unspecified laterality, closed, initial encounter ([place]) 04/28/2016  . Ischemic cardiomyopathy 04/28/2016  . Hyperlipidemia 04/28/2016  . Cancer ([place]) 04/28/2016  . Hip fracture ([place]) 04/28/2016  . Dehydration 04/14/2016  . Orthostasis 02/24/2016  . Acute encephalopathy 02/24/2016  . Confusion 02/21/2016  . Neutropenia ([place]) 02/20/2016  . Thrombocytopenia ([place]) 02/20/2016  . ESBL (extended spectrum beta-lactamase) producing bacteria infection 02/20/2016  . Goals of care, counseling/discussion 02/02/2016  . Urothelial cancer ([place]) 01/01/2016  . Rectal bleeding 11/05/2014  . Absolute anemia 11/05/2014  . Hypothyroidism 11/05/2014  . Metabolic encephalopathy 42/70/6237  . Type 2 diabetes with nephropathy ([place]) 07/26/2014  . Essential hypertension 07/26/2014  . Hyponatremia 07/25/2014  . Vertigo 07/25/2014  . Acute kidney injury superimposed on chronic kidney disease ([place]) 12/13/2013  . Fall 12/13/2013  . Lower urinary tract infectious disease 12/13/2013  . Chronic kidney disease (CKD), stage IV (severe) ([place]) 12/13/2013  . Chest pain at rest 04/26/2011  . Syncope, near 04/26/2011  . CAD (coronary artery disease) 04/23/2011  . Cardiomyopathy, ischemic 04/23/2011  . Dyslipidemia 04/23/2011  . Myocardial infarction, anterior wall, initial care ([place]) 04/20/2011    Past  Surgical History:  Procedure Laterality Date  . BACK SURGERY    . BREAST LUMPECTOMY     right  . CORONARY STENT PLACEMENT    . HIP ARTHROPLASTY Right 04/29/2016   Procedure: ARTHROPLASTY BIPOLAR HIP (HEMIARTHROPLASTY);  Surgeon: Carole Civil, MD;  Location: [facility];  Service: Orthopedics;  Laterality: Right;  . IR FLUORO GUIDE PORT INSERTION RIGHT   01/13/2017  . IR US GUIDE VASC ACCESS RIGHT  01/13/2017  . LEFT HEART CATHETERIZATION WITH CORONARY ANGIOGRAM N/A 04/20/2011   Procedure: LEFT HEART CATHETERIZATION WITH CORONARY ANGIOGRAM;  Surgeon: Burnell Blanks, MD;  Location: [facility] CATH LAB;  Service: Cardiovascular;  Laterality: N/A;  . PERCUTANEOUS CORONARY STENT INTERVENTION (PCI-S) N/A 04/21/2011   Procedure: PERCUTANEOUS CORONARY STENT INTERVENTION (PCI-S);  Surgeon: Sherren Mocha, MD;  Location: [facility] CATH LAB;  Service: Cardiovascular;  Laterality: N/A;     OB History    Gravida  2   Para  2   Term  2   Preterm      AB      Living  2     SAB      TAB      Ectopic      Multiple      Live Births               Home Medications    Prior to Admission medications   Medication Sig Start Date End Date Taking? Authorizing Provider  aspirin EC 81 MG tablet Take 81 mg by mouth daily.    [provider]  cephALEXin (KEFLEX) 500 MG capsule Take 1 capsule (500 mg total) by mouth 4 (four) times daily. 05/04/17   Isla Pence, MD  doxycycline (VIBRA-TABS) 100 MG tablet  04/17/17   [provider]  feeding supplement, ENSURE ENLIVE, (ENSURE ENLIVE) LIQD Take 237 mLs by mouth 2 (two) times daily between meals. Patient taking differently: Take 237 mLs by mouth as needed.  05/02/16   Elgergawy, Silver Huguenin, MD  fish oil-omega-3 fatty acids 1000 MG capsule Take 1 g by mouth daily.    [provider]  fluticasone (FLONASE ALLERGY RELIEF) 50 MCG/ACT nasal spray Place 1 spray into the nose daily as needed for allergies. 01/30/15   [provider]  furosemide (LASIX) 20 MG tablet  04/09/17   [provider]  Gemcitabine HCl (GEMZAR IV) Inject into the vein. Day 1,8,15 every 28 days    [provider]  HYDROcodone-acetaminophen (NORCO/VICODIN) 5-325 MG tablet Take 1 tablet by mouth every 6 (six) hours as needed for moderate pain. 02/08/17   Kathie Dike, MD  levothyroxine  (SYNTHROID, LEVOTHROID) 112 MCG tablet Take 1 tablet (112 mcg total) by mouth daily before breakfast. 09/08/16   Holley Bouche, NP  lidocaine-prilocaine (EMLA) cream Apply a quarter size amount to affected area 1 hour prior to coming to chemotherapy.  Do not rub in.  Cover with plastic wrap. 01/09/17   Jacquelin Hawking, NP  LORazepam (ATIVAN) 0.5 MG tablet Take 1 tablet by mouth as needed.  05/03/16   [provider]  meclizine (ANTIVERT) 25 MG tablet Take 25 mg by mouth 4 (four) times daily as needed. Dizziness    [provider]  metoprolol tartrate (LOPRESSOR) 25 MG tablet Take 12.5 mg by mouth 2 (two) times daily. 10/24/16   [provider]  nitroGLYCERIN (NITROSTAT) 0.4 MG SL tablet Place 0.4 mg under the tongue every 5 (five) minutes x 3 doses as needed. For chest pain.  04/23/11   Dunn, Nedra Hai, PA-C  ondansetron (ZOFRAN) 4 MG tablet Take 1 tablet (4 mg total) by mouth every 8 (eight) hours as needed for nausea or vomiting. 03/16/17   Thurnell Lose, MD  pantoprazole (PROTONIX) 40 MG tablet Take 1 tablet by mouth at bedtime. 09/29/16   [provider]  potassium chloride (K-DUR) 10 MEQ tablet Take 1 tablet (10 mEq total) by mouth daily. 03/16/17   Thurnell Lose, MD  tamsulosin (FLOMAX) 0.4 MG CAPS capsule Take 1 capsule (0.4 mg total) by mouth daily after supper. 03/16/17   Thurnell Lose, MD  traZODone (DESYREL) 50 MG tablet Take 1 tablet by mouth at bedtime. 05/31/16   [provider]    Family History Family History  Problem Relation Age of Onset  . Other Mother   . Cancer Father   . Other Unknown        no known family cardiac disease  . Other Brother        Diptheria  . Depression Sister   . Pneumonia Sister   . Other Brother        murdered    Social History Social History   Tobacco Use  . Smoking status: Never Smoker  . Smokeless tobacco: Never Used  Substance Use Topics  . Alcohol use: No  . Drug use: No      Allergies   Ciprofloxacin; Nitrofurantoin; Lipitor [atorvastatin]; and Penicillins   Review of Systems Review of Systems  Unable to perform ROS: Dementia     Physical Exam Updated Vital Signs BP (!) 118/57 (BP Location: Right Arm)   Pulse 63   Temp 98.3 F (36.8 C) (Rectal)   Resp 20   Wt 77.1 kg (170 lb)   SpO2 100%   BMI 31.09 kg/m   Patient Vitals for the past 24 hrs:  BP Temp Temp src Pulse Resp SpO2 Weight  05/26/17 1030 (!) 132/111 - - 61 15 100 % -  05/26/17 1000 109/65 - - 65 (!) 23 98 % -  05/26/17 0830 (!) 110/98 - - 62 15 100 % -  05/26/17 0802 95/83 - - 60 16 97 % -  05/26/17 0801 (!) 92/34 - - 63 17 100 % -  05/26/17 0800 (!) 90/59 - - 63 17 100 % -  05/26/17 0721 - 98.3 F (36.8 C) Rectal - - - -  05/26/17 0715 - - - - - - 77.1 kg (170 lb)  05/26/17 0713 (!) 118/57 98.1 F (36.7 C) Oral 63 20 100 % -   08:04 Orthostatic Vital Signs LA  Orthostatic Lying   BP- Lying: 90/59   Pulse- Lying: 64       Orthostatic Sitting  BP- Sitting: 95/83   Pulse- Sitting: 68      Physical Exam 0735: Physical examination:  Nursing notes reviewed; Vital signs and O2 SAT reviewed;  Constitutional: Well developed, Well nourished, In no acute distress. Moaning.; Head:  Normocephalic, atraumatic; Eyes: EOMI, PERRL, No scleral icterus; ENMT: Mouth and pharynx normal, Mucous membranes moist; Neck: Supple, Full range of motion, No lymphadenopathy; Cardiovascular: Regular rate and rhythm, No gallop; Respiratory: Breath sounds clear & equal bilaterally, No wheezes. Normal respiratory effort/excursion; Chest: Nontender, Movement normal; Abdomen: Soft, Nontender, Nondistended, Normal bowel sounds; Genitourinary: No CVA tenderness; Extremities: Peripheral pulses normal, No tenderness, No edema, No calf edema or asymmetry.; Neuro: Laying eyes closed, will spontaneously open her eyes. Moans and speaks to family. Speech clear. Grips equal. Pt  moves all extremities spontaneously  and to command without apparent gross focal motor deficits.; Skin: Color normal, Warm, Dry.   ED Treatments / Results  Labs (all labs ordered are listed, but only abnormal results are displayed)   EKG EKG Interpretation  Date/Time:  Friday May 26 2017 07:33:39 EDT Ventricular Rate:  64 PR Interval:    QRS Duration: 126 QT Interval:  422 QTC Calculation: 436 R Axis:   34 Text Interpretation:  Sinus rhythm prolonged PR interval Nonspecific intraventricular conduction delay When compared with ECG of 04/28/2017 No significant change was found Confirmed by Francine Graven [phone]) on 05/26/2017 7:42:37 AM   Radiology   Procedures Procedures (including critical care time)  Medications Ordered in ED Medications - No data to display   Initial Impression / Assessment and Plan / ED Course  I have reviewed the triage vital signs and the nursing notes.  Pertinent labs & imaging results that were available during my care of the patient were reviewed by me and considered in my medical decision making (see chart for details).  MDM Reviewed: previous chart, nursing note and vitals Reviewed previous: labs and ECG Interpretation: labs, ECG, x-ray and CT scan   Results for orders placed or performed during the hospital encounter of 05/26/17  Urinalysis, Routine w reflex microscopic  Result Value Ref Range   Color, Urine YELLOW YELLOW   APPearance CLEAR CLEAR   Specific Gravity, Urine 1.010 1.005 - 1.030   pH 5.0 5.0 - 8.0   Glucose, UA NEGATIVE NEGATIVE mg/dL   Hgb urine dipstick NEGATIVE NEGATIVE   Bilirubin Urine NEGATIVE NEGATIVE   Ketones, ur NEGATIVE NEGATIVE mg/dL   Protein, ur NEGATIVE NEGATIVE mg/dL   Nitrite NEGATIVE NEGATIVE   Leukocytes, UA NEGATIVE NEGATIVE  Comprehensive metabolic panel  Result Value Ref Range   Sodium 130 (L) 135 - 145 mmol/L   Potassium 4.4 3.5 - 5.1 mmol/L   Chloride 100 (L) 101 - 111 mmol/L   CO2 23 22 - 32 mmol/L   Glucose, Bld 93 65 - 99  mg/dL   BUN 21 (H) 6 - 20 mg/dL   Creatinine, Ser 2.68 (H) 0.44 - 1.00 mg/dL   Calcium 9.0 8.9 - 10.3 mg/dL   Total Protein 5.7 (L) 6.5 - 8.1 g/dL   Albumin 2.5 (L) 3.5 - 5.0 g/dL   AST 16 15 - 41 U/L   ALT 7 (L) 14 - 54 U/L   Alkaline Phosphatase 53 38 - 126 U/L   Total Bilirubin 1.0 0.3 - 1.2 mg/dL   GFR calc non Af Amer 15 (L) >60 mL/min   GFR calc Af Amer 18 (L) >60 mL/min   Anion gap 7 5 - 15  Troponin I  Result Value Ref Range   Troponin I <0.03 <0.03 ng/mL  Lactic acid, plasma  Result Value Ref Range   Lactic Acid, Venous 1.1 0.5 - 1.9 mmol/L  Lactic acid, plasma  Result Value Ref Range   Lactic Acid, Venous 0.9 0.5 - 1.9 mmol/L  CBC with Differential  Result Value Ref Range   WBC 3.6 (L) 4.0 - 10.5 K/uL   RBC 2.89 (L) 3.87 - 5.11 MIL/uL   Hemoglobin 9.3 (L) 12.0 - 15.0 g/dL   HCT 27.8 (L) 36.0 - 46.0 %   MCV 96.2 78.0 - 100.0 fL   MCH 32.2 26.0 - 34.0 pg   MCHC 33.5 30.0 - 36.0 g/dL   RDW 14.4 11.5 - 15.5 %   Platelets 147 (L) 150 -  400 K/uL   Neutrophils Relative % 59 %   Neutro Abs 2.1 1.7 - 7.7 K/uL   Lymphocytes Relative 19 %   Lymphs Abs 0.7 0.7 - 4.0 K/uL   Monocytes Relative 16 %   Monocytes Absolute 0.6 0.1 - 1.0 K/uL   Eosinophils Relative 6 %   Eosinophils Absolute 0.2 0.0 - 0.7 K/uL   Basophils Relative 0 %   Basophils Absolute 0.0 0.0 - 0.1 K/uL   Dg Chest 2 View Result Date: 05/26/2017 CLINICAL DATA:  Altered mental status and weakness for the past 4 days. History of renal cell carcinoma. EXAM: CHEST - 2 VIEW COMPARISON:  05/04/2017; 04/28/2017; 01/28/2017; chest CT-12/23/2016 FINDINGS: Grossly unchanged cardiac silhouette and mediastinal contours with atherosclerotic plaque within the thoracic aorta. Post coronary stent placement. Stable positioning of support apparatus. Grossly unchanged trace left-sided effusion and associated left basilar opacities. No new focal airspace opacities. No definite evidence of edema. No acute osseus abnormalities.  IMPRESSION: Unchanged trace left-sided effusion associated left basilar opacities, atelectasis versus infiltrate. Electronically Signed   By: Sandi Mariscal M.D.   On: 05/26/2017 09:16   Ct Head Wo Contrast Result Date: 05/26/2017 CLINICAL DATA:  Altered level of consciousness EXAM: CT HEAD WITHOUT CONTRAST TECHNIQUE: Contiguous axial images were obtained from the base of the skull through the vertex without intravenous contrast. COMPARISON:  05/04/2017 FINDINGS: Brain: Diffuse atrophic and chronic white matter ischemic changes are noted. No findings to suggest acute hemorrhage, acute infarction or space-occupying mass lesion are noted. Vascular: No hyperdense vessel or unexpected calcification. Skull: Normal. Negative for fracture or focal lesion. Sinuses/Orbits: No acute finding. Other: None. IMPRESSION: Chronic atrophic and ischemic changes without acute abnormality. Electronically Signed   By: Inez Catalina M.D.   On: 05/26/2017 09:05   Results for YOONA, ISHII (MRN [id number]) as of 05/26/2017 10:53  Ref. Range 03/14/2017 05:06 03/15/2017 06:50 03/16/2017 03:57 04/28/2017 13:14 05/04/2017 10:07 05/26/2017 08:05  BUN Latest Ref Range: 6 - 20 mg/dL 31 (H) 32 (H) 32 (H) 21 (H) 21 (H) 21 (H)  Creatinine Latest Ref Range: 0.44 - 1.00 mg/dL 1.64 (H) 1.63 (H) 1.69 (H) 2.06 (H) 2.46 (H) 2.68 (H)   1015:  Pt unable to stand (does stand per baseline). Not taking PO here. BUN/Cr slowly increasing over the past month and BP soft today; judicious IVF given.  Dx and testing d/w pt's family.  Questions answered.  Verb understanding, agreeable to admit. T/C returned from [facility] Dr. Herbert Moors, case discussed, including:  HPI, pertinent PM/SHx, VS/PE, dx testing, ED course and treatment:  Agreeable to come to ED for evaluation for admit.    Final Clinical Impressions(s) / ED Diagnoses   Final diagnoses:  None    ED Discharge Orders    None       Francine Graven, DO 05/29/17 [id number]

## 2017-05-27 DIAGNOSIS — G9341 Metabolic encephalopathy: Principal | ICD-10-CM

## 2017-05-27 DIAGNOSIS — N179 Acute kidney failure, unspecified: Secondary | ICD-10-CM

## 2017-05-27 DIAGNOSIS — E871 Hypo-osmolality and hyponatremia: Secondary | ICD-10-CM

## 2017-05-27 DIAGNOSIS — N189 Chronic kidney disease, unspecified: Secondary | ICD-10-CM

## 2017-05-27 LAB — BASIC METABOLIC PANEL
BUN: 19 mg/dL (ref 6–20)
CO2: 22 mmol/L (ref 22–32)
GFR calc non Af Amer: 20 mL/min — ABNORMAL LOW (ref >60)
Glucose, Bld: 62 mg/dL — ABNORMAL LOW (ref 65–99)

## 2017-05-27 LAB — URINE CULTURE: Culture: NO GROWTH

## 2017-05-27 LAB — BASIC METABOLIC PANEL WITH GFR
Anion gap: 7 (ref 5–15)
Calcium: 8.3 mg/dL — ABNORMAL LOW (ref 8.9–10.3)
Chloride: 101 mmol/L (ref 101–111)
Creatinine, Ser: 2.19 mg/dL — ABNORMAL HIGH (ref 0.44–1.00)
GFR calc Af Amer: 23 mL/min — ABNORMAL LOW (ref >60)
Potassium: 4.2 mmol/L (ref 3.5–5.1)
Sodium: 130 mmol/L — ABNORMAL LOW (ref 135–145)

## 2017-05-27 LAB — CBC
HCT: 24.7 % — ABNORMAL LOW (ref 36.0–46.0)
Hemoglobin: 8.3 g/dL — ABNORMAL LOW (ref 12.0–15.0)
MCH: 32.5 pg (ref 26.0–34.0)
MCHC: 33.6 g/dL (ref 30.0–36.0)
MCV: 96.9 fL (ref 78.0–100.0)
Platelets: 134 10*3/uL — ABNORMAL LOW (ref 150–400)
RBC: 2.55 MIL/uL — ABNORMAL LOW (ref 3.87–5.11)
RDW: 14.6 % (ref 11.5–15.5)
WBC: 3 K/uL — ABNORMAL LOW (ref 4.0–10.5)

## 2017-05-27 LAB — CALCIUM, IONIZED: Calcium, Ionized, Serum: 5.1 mg/dL (ref 4.5–5.6)

## 2017-05-27 LAB — PROCALCITONIN: Procalcitonin: 0.18 ng/mL

## 2017-05-27 LAB — GLUCOSE, CAPILLARY: Glucose-Capillary: 120 mg/dL — ABNORMAL HIGH (ref 65–99)

## 2017-05-27 LAB — VITAMIN B12: Vitamin B-12: 162 pg/mL — ABNORMAL LOW (ref 180–914)

## 2017-05-27 NOTE — Progress Notes (Addendum)
PROGRESS NOTE                                                                                                                                                                                                             Patient Demographics:    Joann Hunter, is a 84 y.o. female, DOB - 05/29/1932, NOB:[id number]  Admit date - 05/26/2017   Admitting Physician Cristy Folks, MD  Outpatient Primary MD for the patient is Monico Blitz, MD  LOS - 1  Outpatient Specialists: None  Chief Complaint  Patient presents with  . Altered Mental Status       Brief Narrative   84 year old female with CAD with history of NSTEMI status post PCI in 9476, chronic diastolic CHF, dementia, hypertension, hyperlipidemia, hypothyroidism, metastatic urothelial cell carcinoma (on immunotherapy), stage IV chronic kidney disease presented to the ED with acute change in mental status.  As per daughter patient was doing well (has mild dementia) but 5 days prior to admission she was restless, hallucinating and delusional, started speaking with people who were not present in the room.  Also would not sleep throughout the night.  She was seen by her PCP the next day and found to have bacteriuria in the urine and started on Levaquin for UTI.  Symptoms did not improve and her mental status worsened.  Patient would could not sleep all night and continue to talk incoherently. In the ED vitals were stable.  Blood work showed mildly diminished WBC, sodium of 130, worsened renal function with creatinine of 2.68 (baseline creatinine 1.6-1.9).  Head CT was negative for acute findings and showed chronic microvascular ischemic changes.  Chest x-ray negative for any infection. Patient admitted for acute metabolic encephalopathy.   Subjective:   CBG was low at 62 this morning as patient had not eaten anything.  As per daughter patient had not eaten anything since yesterday.    Feels her mental status is returning to baseline.  Assessment  & Plan :   Principal problem Acute metabolic encephalopathy No signs of active infection.  Likely dehydration and recent UTI may have contributed.  Discontinued antibiotics.  Continue IV fluids.  Renal function slowly improving.  Mental status also improving to her baseline.  Avoid narcotics and further benzodiazepine.   Acute kidney injury ([place]) Secondary to dehydration and recent?  UTI.  Continue IV fluids.  Discontinue diuretic.  Avoid nephrotoxins.  CT abdomen pelvis showing findings of urothelial cancer no signs of obstruction.  Hyponatremia Chronic.  Monitor with hydration.  Hypertension/CAD Holding beta-blocker due to soft blood pressure.  Continue aspirin.  Hypothyroidism Continue Synthroid.  TSH pending  Grade 1 diastolic dysfunction Hypovolemic.  Holding Lasix and gentle hydration..  Metastatic urothelial cell cancer Patient has failed outpatient chemotherapy and received 1 dose of gemcitabine before being in and out of the hospital and SNF.  Will discuss CODE STATUS with the daughter.  Hypoglycemia Secondary to poor p.o. intake.  Monitor for now.   Code Status : Full code  Family Communication  : Daughter at bedside  Disposition Plan  : Home possibly in 1-2 days.  Barriers For Discharge : Active symptoms  Consults  : None  Procedures  : CT abdomen pelvis  DVT Prophylaxis  :   Heparin  Lab Results  Component Value Date   PLT 134 (L) 05/27/2017    Antibiotics  :   Anti-infectives (From admission, onward)   None        Objective:   Vitals:   05/26/17 1330 05/26/17 1507 05/26/17 2112 05/27/17 0552  BP: (!) 98/45 (!) 124/55 (!) 150/61 (!) 132/48  Pulse: (!) 56 61 68 73  Resp: 14 18 18 18   Temp:  98.2 F (36.8 C) 98.3 F (36.8 C) 99 F (37.2 C)  TempSrc:  Oral Oral Oral  SpO2: 97% 100% 97% 99%  Weight:        Wt Readings from Last 3 Encounters:  05/26/17 77.1 kg (170 lb)    05/04/17 77.1 kg (170 lb)  04/28/17 78.5 kg (173 lb)     Intake/Output Summary (Last 24 hours) at 05/27/2017 1153 Last data filed at 05/27/2017 0800 Gross per 24 hour  Intake 432.5 ml  Output 553 ml  Net -120.5 ml     Physical Exam  Gen: not in distress HEENT: moist mucosa, supple neck Chest: clear b/l, no added sounds CVS: N S1&S2, no murmurs, GI: soft, NT, ND,  Musculoskeletal: warm, no edema CNS: AAOX2, nonfocal    Data Review:    CBC Recent Labs  Lab 05/26/17 0805 05/27/17 0712  WBC 3.6* 3.0*  HGB 9.3* 8.3*  HCT 27.8* 24.7*  PLT 147* 134*  MCV 96.2 96.9  MCH 32.2 32.5  MCHC 33.5 33.6  RDW 14.4 14.6  LYMPHSABS 0.7  --   MONOABS 0.6  --   EOSABS 0.2  --   BASOSABS 0.0  --     Chemistries  Recent Labs  Lab 05/26/17 0805 05/27/17 0712  NA 130* 130*  K 4.4 4.2  CL 100* 101  CO2 23 22  GLUCOSE 93 62*  BUN 21* 19  CREATININE 2.68* 2.19*  CALCIUM 9.0 8.3*  AST 16  --   ALT 7*  --   ALKPHOS 53  --   BILITOT 1.0  --    ------------------------------------------------------------------------------------------------------------------ No results for input(s): CHOL, HDL, LDLCALC, TRIG, CHOLHDL, LDLDIRECT in the last 72 hours.  Lab Results  Component Value Date   HGBA1C 6.3 (H) 02/24/2016   ------------------------------------------------------------------------------------------------------------------ Recent Labs    05/26/17 0805  TSH 0.210*   ------------------------------------------------------------------------------------------------------------------ No results for input(s): VITAMINB12, FOLATE, FERRITIN, TIBC, IRON, RETICCTPCT in the last 72 hours.  Coagulation profile No results for input(s): INR, PROTIME in the last 168 hours.  No results for input(s): DDIMER in the last 72 hours.  Cardiac Enzymes Recent Labs  Lab  05/26/17 0805  TROPONINI <0.03    ------------------------------------------------------------------------------------------------------------------    Component Value Date/Time   BNP 67.1 01/28/2017 2350    Inpatient Medications  Scheduled Meds: . aspirin EC  81 mg Oral Daily  . heparin  5,000 Units Subcutaneous Q8H  . levothyroxine  112 mcg Oral QAC breakfast  . omega-3 acid ethyl esters  1 g Oral Daily  . pantoprazole  40 mg Oral QHS  . tamsulosin  0.4 mg Oral QPC supper  . traZODone  50 mg Oral QHS   Continuous Infusions: . sodium chloride 75 mL/hr at 05/27/17 0253   PRN Meds:.acetaminophen **OR** acetaminophen, LORazepam, meclizine, ondansetron **OR** ondansetron (ZOFRAN) IV, polyethylene glycol, traMADol  Micro Results Recent Results (from the past 240 hour(s))  Urine culture     Status: None   Collection Time: 05/26/17  7:29 AM  Result Value Ref Range Status   Specimen Description   Final    URINE, CLEAN CATCH Performed at [facility], [address], [place], [place] [address]    Special Requests   Final    NONE Performed at [facility], [address], [place], [place] [address]    Culture   Final    NO GROWTH Performed at [facility], [place] [address], [place],  [address]    Report Status 05/27/2017 FINAL  Final    Radiology Reports Ct Abdomen Pelvis Wo Contrast  Result Date: 05/26/2017 CLINICAL DATA:  Abdominal distension, recent history of UTI, visual hallucinations EXAM: CT ABDOMEN AND PELVIS WITHOUT CONTRAST TECHNIQUE: Multidetector CT imaging of the abdomen and pelvis was performed following the standard protocol without IV contrast. COMPARISON:  01/19/2017 FINDINGS: Lower chest: Trace right pleural effusion. Atelectasis/consolidation posteriorly in the right lower lobe. Coronary calcifications. Hepatobiliary: Gallbladder physiologically distended. Liver unremarkable. Pancreas: Marked parenchymal atrophy without mass or ductal dilatation. Spleen: Normal in size without focal  abnormality. Adrenals/Urinary Tract: Enlargement of right renal mass which effaces the right renal pelvis and distends the central renal collecting system extending into the proximal ureter. This measures 4.4 cm transverse at the level of the central renal collecting system, previously 3.7 cm. The left kidney is unremarkable. Normal adrenals. Urinary bladder incompletely distended. Stomach/Bowel: Stomach is decompressed. Small bowel nondilated. Moderate proximal colonic fecal material, decompressed distally. No focal wall thickening or associated inflammatory change evident. Vascular/Lymphatic: Extensive aortoiliac calcified plaque without aneurysm. No abdominal or pelvic adenopathy localized. Bilateral pelvic phleboliths. Reproductive: Uterus and bilateral adnexa are unremarkable. Other: No ascites.  No free air. Musculoskeletal: Stable L2 compression deformity. Right hip arthroplasty hardware projects in expected location, distal extent not visualized. Advanced left hip DJD. No fracture or worrisome bone lesion. IMPRESSION: 1. No acute abdominal findings. 2. Trace right pleural effusion. 3. Continued enlargement of right renal/collecting system/ureteral mass. Electronically Signed   By: Lucrezia Europe M.D.   On: 05/26/2017 12:03   Dg Chest 2 View  Result Date: 05/26/2017 CLINICAL DATA:  Altered mental status and weakness for the past 4 days. History of renal cell carcinoma. EXAM: CHEST - 2 VIEW COMPARISON:  05/04/2017; 04/28/2017; 01/28/2017; chest CT-12/23/2016 FINDINGS: Grossly unchanged cardiac silhouette and mediastinal contours with atherosclerotic plaque within the thoracic aorta. Post coronary stent placement. Stable positioning of support apparatus. Grossly unchanged trace left-sided effusion and associated left basilar opacities. No new focal airspace opacities. No definite evidence of edema. No acute osseus abnormalities. IMPRESSION: Unchanged trace left-sided effusion associated left basilar opacities,  atelectasis versus infiltrate. Electronically Signed   By: Eldridge Abrahams.D.  On: 05/26/2017 09:16   Dg Chest 2 View  Result Date: 05/04/2017 CLINICAL DATA:  Altered mental state. EXAM: CHEST - 2 VIEW COMPARISON:  04/28/2017. FINDINGS: PowerPort catheter with tip over superior vena cava. Cardiomegaly with normal pulmonary vascularity. No focal infiltrate. Low lung volumes with left base subsegmental atelectasis. Small left pleural effusion. IMPRESSION: 1.  PowerPort catheter with tip over superior vena cava. 2.  Cardiomegaly with normal pulmonary vascularity. 3. Mild left base subsegmental atelectasis. Small left pleural effusion. Electronically Signed   By: Marcello Moores  Register   On: 05/04/2017 11:03   Ct Head Wo Contrast  Result Date: 05/26/2017 CLINICAL DATA:  Altered level of consciousness EXAM: CT HEAD WITHOUT CONTRAST TECHNIQUE: Contiguous axial images were obtained from the base of the skull through the vertex without intravenous contrast. COMPARISON:  05/04/2017 FINDINGS: Brain: Diffuse atrophic and chronic white matter ischemic changes are noted. No findings to suggest acute hemorrhage, acute infarction or space-occupying mass lesion are noted. Vascular: No hyperdense vessel or unexpected calcification. Skull: Normal. Negative for fracture or focal lesion. Sinuses/Orbits: No acute finding. Other: None. IMPRESSION: Chronic atrophic and ischemic changes without acute abnormality. Electronically Signed   By: Inez Catalina M.D.   On: 05/26/2017 09:05   Ct Head Wo Contrast  Result Date: 05/04/2017 CLINICAL DATA:  Altered level of consciousness EXAM: CT HEAD WITHOUT CONTRAST TECHNIQUE: Contiguous axial images were obtained from the base of the skull through the vertex without intravenous contrast. COMPARISON:  MRI 03/13/2017 FINDINGS: Brain: There is atrophy and chronic small vessel disease changes. No acute intracranial abnormality. Specifically, no hemorrhage, hydrocephalus, mass lesion, acute infarction,  or significant intracranial injury. Vascular: No hyperdense vessel or unexpected calcification. Skull: No acute calvarial abnormality. Sinuses/Orbits: Visualized paranasal sinuses and mastoids clear. Orbital soft tissues unremarkable. Other: None IMPRESSION: No acute intracranial abnormality. Atrophy, chronic microvascular disease. Electronically Signed   By: Rolm Baptise M.D.   On: 05/04/2017 10:59   Dg Chest Portable 1 View  Result Date: 04/28/2017 CLINICAL DATA:  Chest pain. EXAM: PORTABLE CHEST 1 VIEW COMPARISON:  Chest x-ray dated March 11, 2017. FINDINGS: Unchanged right chest wall port catheter with the tip in the distal SVC. The heart size and mediastinal contours are within normal limits. Normal pulmonary vascularity. Low lung volumes. No focal consolidation, pleural effusion, or pneumothorax. IMPRESSION: No active disease. Electronically Signed   By: Titus Dubin M.D.   On: 04/28/2017 13:01    Time Spent in minutes 25   Jazmarie Biever M.D on 05/27/2017 at 11:53 AM  Between 7am to 7pm - Pager - [phone]  After 7pm go to [email or web address] - password [facility]  [facility] -  Office  [phone]

## 2017-05-28 DIAGNOSIS — E538 Deficiency of other specified B group vitamins: Secondary | ICD-10-CM

## 2017-05-28 DIAGNOSIS — E039 Hypothyroidism, unspecified: Secondary | ICD-10-CM

## 2017-05-28 DIAGNOSIS — C689 Malignant neoplasm of urinary organ, unspecified: Secondary | ICD-10-CM

## 2017-05-28 DIAGNOSIS — N170 Acute kidney failure with tubular necrosis: Secondary | ICD-10-CM

## 2017-05-28 DIAGNOSIS — I951 Orthostatic hypotension: Secondary | ICD-10-CM

## 2017-05-28 DIAGNOSIS — N183 Chronic kidney disease, stage 3 unspecified: Secondary | ICD-10-CM | POA: Diagnosis present

## 2017-05-28 DIAGNOSIS — G9341 Metabolic encephalopathy: Secondary | ICD-10-CM | POA: Diagnosis present

## 2017-05-28 DIAGNOSIS — N179 Acute kidney failure, unspecified: Secondary | ICD-10-CM | POA: Diagnosis present

## 2017-05-28 LAB — BASIC METABOLIC PANEL
ANION GAP: 6 (ref 5–15)
BUN: 16 mg/dL (ref 6–20)
CALCIUM: 8.1 mg/dL — AB (ref 8.9–10.3)
CHLORIDE: 105 mmol/L (ref 101–111)
CO2: 21 mmol/L — AB (ref 22–32)
Creatinine, Ser: 1.9 mg/dL — ABNORMAL HIGH (ref 0.44–1.00)
GFR calc non Af Amer: 23 mL/min — ABNORMAL LOW (ref >60)
GFR, EST AFRICAN AMERICAN: 27 mL/min — AB (ref >60)
Glucose, Bld: 108 mg/dL — ABNORMAL HIGH (ref 65–99)
POTASSIUM: 3.9 mmol/L (ref 3.5–5.1)
Sodium: 132 mmol/L — ABNORMAL LOW (ref 135–145)

## 2017-05-28 LAB — PROCALCITONIN: Procalcitonin: <0.1 ng/mL

## 2017-05-28 MED ORDER — LEVOTHYROXINE SODIUM 100 MCG PO TABS: 100.0000 ug | ORAL_TABLET | Freq: Every day | ORAL | Status: DC

## 2017-05-28 MED ORDER — LEVOTHYROXINE SODIUM 100 MCG PO TABS: 100.0000 ug | ORAL_TABLET | Freq: Every day | ORAL | 0 refills | Status: AC

## 2017-05-28 MED ORDER — CYANOCOBALAMIN 1000 MCG/ML IJ SOLN
1000.0000 ug | Freq: Once | INTRAMUSCULAR | Status: AC
  Administered 2017-05-28: 1000 ug via SUBCUTANEOUS
  Filled 2017-05-28: qty 1

## 2017-05-28 MED ORDER — HEPARIN SOD (PORK) LOCK FLUSH 100 UNIT/ML IV SOLN
500.0000 [IU] | INTRAVENOUS | Status: DC | PRN
  Filled 2017-05-28: qty 5

## 2017-05-28 NOTE — Evaluation (Signed)
Physical Therapy Evaluation Patient Details Name: Joann Hunter MRN: [id number] DOB: 1932/08/18 Today's Date: 05/28/2017   History of Present Illness  Joann Hunter is a 84 y.o. female with medical history significant of coronary artery disease status post NSTEMI status post PCI 2376, chronic diastolic heart failure by echo on 01/22/2017, dementia, hypertension, hyperlipidemia, hypothyroidism, metastatic urothelial cell carcinoma of the right kidney with most recent chemotherapy gemcitabine on 01/2017, stage IV chronic kidney disease who presents to the emergency department with altered mental status for 1 week.  Per discussion with the daughter is the patient cannot participate in the exam she reports that the patient was doing well until approximately 5 days ago when she began to have hallucinations and delusions and start speaking to people who were not there.  She was noted to be quite fidgety and anxious and would not sleep throughout the night.  Patient was taken to her PCP about 4 days ago and based on bacteriuria was diagnosed with a possible UTI and started on levofloxacin.  Patient did not improve and if anything became worse on the levofloxacin per the daughter.  Patient began stop sleeping completely and would spend all night talking to person who were not present.  She would often not be able to hold a conversation which she normally could.  She was also noted to be progressively weaker and would often just lie in bed or in a chair and started to become unable to ambulate.  The daughter tried giving the patient her as needed lorazepam without much improvement in her agitation.  Otherwise patient has not been noted to have any fevers, dyspnea, shortness of breath, nausea, vomiting, abdominal pain, cough, congestion, rhinorrhea, diarrhea.    Clinical Impression  Therapist consulted with patient's physician and nurse and they consented to therapist working with patient. Patient was found awake  and alert in bed upon entering with daughter in room. Patient reported some dizziness she attributed to vertigo which patient's daughter said has been checked many times. Patient performed supine to sit at EOB with minA. Then patient performed sit to stand with rolling walker with min A. Patient ambulated 6 feet and then returned to bed with min A. Patient's daughter stated that this was the patient's baseline. Patient returned to bed and reported that she was feeling okay. Patient would continue to benefit from skilled physical therapy while at the hospital in order to progress patient with lower extremity strengthening, ambulation, balance, and overall functional mobility.     Follow Up Recommendations Home health PT;Supervision/Assistance - 24 hour    Equipment Recommendations       Recommendations for Other Services       Precautions / Restrictions Precautions Precautions: Fall Restrictions Weight Bearing Restrictions: No      Mobility  Bed Mobility Overal bed mobility: Needs Assistance Bed Mobility: Supine to Sit     Supine to sit: Min assist     General bed mobility comments: Patient required Min A at trunk and lower extremities to come up to sitting.   Transfers Overall transfer level: Needs assistance Equipment used: Rolling walker (2 wheeled) Transfers: Sit to/from Stand Sit to Stand: Min assist         General transfer comment: Patient required Minimal assistance with rolling walker. Patient's daughter stated this was patient's baseline.   Ambulation/Gait Ambulation/Gait assistance: Min assist Ambulation Distance (Feet): 6 Feet Assistive device: Rolling walker (2 wheeled) Gait Pattern/deviations: Decreased step length - right;Decreased step length - left;Decreased stride  length;Shuffle     General Gait Details: Patient ambulated at a decreased gait velocity. Patient's daughter reported that this was the patient's baseline.   Stairs             Wheelchair Mobility    Modified Rankin (Stroke Patients Only)       Balance Overall balance assessment: Needs assistance Sitting-balance support: Bilateral upper extremity supported Sitting balance-Leahy Scale: Fair     Standing balance support: Bilateral upper extremity supported;During functional activity Standing balance-Leahy Scale: Fair Standing balance comment: Patient required rolling walker for standing balance and Min A of therapist.                              Pertinent Vitals/Pain Pain Assessment: No/denies pain    Home Living Family/patient expects to be discharged to:: Private residence Living Arrangements: Children Available Help at Discharge: Family Type of Home: House Home Access: Level entry     Home Layout: One level Home Equipment: Environmental consultant - 2 wheels;Bedside commode;Shower seat;Wheelchair - manual;Hospital bed;Grab bars - tub/shower;Walker - 4 wheels Additional Comments: Patient's daughter stated patient uses bedside commode     Prior Function Level of Independence: Needs assistance;Independent with assistive device(s)   Gait / Transfers Assistance Needed: Patient's daughter stated patient ambulates a few feet at home at baseline with rolling walker.   ADL's / Homemaking Assistance Needed: assisted by family        Hand Dominance        Extremity/Trunk Assessment   Upper Extremity Assessment Upper Extremity Assessment: Defer to OT evaluation    Lower Extremity Assessment Lower Extremity Assessment: Generalized weakness    Cervical / Trunk Assessment Cervical / Trunk Assessment: Kyphotic  Communication   Communication: HOH  Cognition Arousal/Alertness: Awake/alert Behavior During Therapy: WFL for tasks assessed/performed Overall Cognitive Status: History of cognitive impairments - at baseline                                        General Comments      Exercises     Assessment/Plan    PT  Assessment Patient needs continued PT services  PT Problem List Decreased strength;Decreased activity tolerance;Decreased balance;Decreased mobility;Decreased cognition       PT Treatment Interventions DME instruction;Gait training;Functional mobility training;Therapeutic activities;Therapeutic exercise;Balance training;Neuromuscular re-education;Patient/family education;Wheelchair mobility training    PT Goals (Current goals can be found in the Care Plan section)  Acute Rehab PT Goals Patient Stated Goal: Return home with daughter PT Goal Formulation: With patient/family Time For Goal Achievement: 06/04/17 Potential to Achieve Goals: Fair    Frequency Min 3X/week   Barriers to discharge        Co-evaluation               AM-PAC PT "6 Clicks" Daily Activity  Outcome Measure Difficulty turning over in bed (including adjusting bedclothes, sheets and blankets)?: A Little Difficulty moving from lying on back to sitting on the side of the bed? : A Little Difficulty sitting down on and standing up from a chair with arms (e.g., wheelchair, bedside commode, etc,.)?: A Little Help needed moving to and from a bed to chair (including a wheelchair)?: A Little Help needed walking in hospital room?: A Lot Help needed climbing 3-5 steps with a railing? : A Lot 6 Click Score: 16    End of Session Equipment  Utilized During Treatment: Gait belt Activity Tolerance: Patient tolerated treatment well;Other (comment)(Patient reported dizziness due to vertigo, but was able to tolerate movement)   Nurse Communication: Mobility status PT Visit Diagnosis: Unsteadiness on feet (R26.81);Other abnormalities of gait and mobility (R26.89);Muscle weakness (generalized) (M62.81)    Time: 9924-2683 PT Time Calculation (min) (ACUTE ONLY): 16 min   Charges:   PT Evaluation $PT Eval Low Complexity: 1 Low     PT G Codes:       Clarene Critchley PT, DPT 11:52 AM, 05/28/17 [phone]

## 2017-05-28 NOTE — Care Management Note (Signed)
Case Management Note  Patient Details  Name: Joann Hunter MRN: [id number] Date of Birth: 1932/07/01  Subjective/Objective:            Pt presented for [facility].  Pt to be d/c with [facility] PT services.       Action/Plan: Spoke with daughter over the phone.  Pt has used [facility] before and would like to use again.  Jermaine with [facility] accepted referral.   Expected Discharge Date:  05/28/17               Expected Discharge Plan:  [place]  In-House Referral:  NA  Discharge planning Services  CM Consult  Post Acute Care Choice:  Home Health Choice offered to:  Adult Children  DME Arranged:  N/A DME Agency:  NA  HH Arranged:  PT HH Agency:     Status of Service:  Completed, signed off  If discussed at [place] of Stay Meetings, dates discussed:    Additional Comments:  Claudie Leach, RN 05/28/2017, 3:20 PM

## 2017-05-28 NOTE — Discharge Summary (Signed)
Physician Discharge Summary  Joann Hunter DJS:[id number] DOB: Jul 03, 1932 DOA: 05/26/2017  PCP: Monico Blitz, MD  Admit date: 05/26/2017 Discharge date: 05/28/2017  Admitted From: Home Disposition: Home  Recommendations for Outpatient Follow-up:  1. Follow up with PCP in 1-2 weeks. 2. Patient will need monthly vitamin B12 injections.   Home Health: PT Equipment/Devices: Walker at home  Discharge Condition: Fair CODE STATUS: Full code Diet recommendation: Heart Healthy    Discharge Diagnoses:  Principal Problem:   Acute metabolic encephalopathy   Active Problems:   Acute renal failure superimposed on stage 3 chronic kidney disease (HCC) Orthostasis Hypertension B12 deficiency    CAD (coronary artery disease)   Hyponatremia   Essential hypertension   Absolute anemia   Hypothyroidism   Urothelial cancer (HCC)   Ischemic cardiomyopathy   Hyperlipidemia   Hypercalcemia of malignancy   Brief narrative/HPI Please refer to admission H&P for details, in brief,84 year old female with CAD with history of NSTEMI status post PCI in 8850, chronic diastolic CHF, dementia, hypertension, hyperlipidemia, hypothyroidism, metastatic urothelial cell carcinoma (on immunotherapy), stage IV chronic kidney disease presented to the ED with acute change in mental status.  As per daughter patient was doing well (has mild dementia) but 5 days prior to admission she was restless, hallucinating and delusional, started speaking with people who were not present in the room.  Also would not sleep throughout the night.  She was seen by her PCP the next day and found to have bacteriuria in the urine and started on Levaquin for UTI.  Symptoms did not improve and her mental status worsened.  Patient would could not sleep all night and continue to talk incoherently. In the ED vitals were stable.  Blood work showed mildly diminished WBC, sodium of 130, worsened renal function with creatinine of 2.68 (baseline  creatinine 1.6-1.9).  Head CT was negative for acute findings and showed chronic microvascular ischemic changes.  Chest x-ray negative for any infection. Patient admitted for acute metabolic encephalopathy.   Hospital course  Principal problem Acute metabolic encephalopathy No signs of active infection.  Likely dehydration and recent UTI may have contributed.  Discontinued antibiotics.    Resolved with hydration.   Acute kidney injury imposed on chronic kidney disease stage III-IV ([place]) Secondary to dehydration and recent?  UTI.  Improved with IV fluids and now at baseline.   Diuretic was held and can be resumed upon discharge. Avoid nephrotoxins.  CT abdomen pelvis showing findings of urothelial cancer no signs of obstruction.  Hyponatremia Chronic.  Improved with hydration.  Hypertension/CAD Any aspirin.  Beta-blocker held due to soft blood pressure.  Resumed on discharge.  Hypothyroidism TSH suppressed.  Reduce Synthroid dose 200 mcg daily.  Grade 1 diastolic dysfunction She was hypovolemic.  Lasix held and given gentle hydration.  Metastatic urothelial cell cancer Patient has failed outpatient chemotherapy and received 1 dose of gemcitabine before being in and out of the hospital and SNF.    Discussed CODE STATUS with the daughter and she wants full scope of treatment until patient goes back to see her oncologist.  Hypoglycemia Secondary to poor p.o. intake.  Resolved.  Generalized weakness Seen by PT and recommends home health.  Family Communication  : Daughter at bedside  Disposition Plan  : Home     Consults  : None  Procedures  : CT abdomen pelvis   Discharge Instructions   Allergies as of 05/28/2017      Reactions   Ciprofloxacin Other (See Comments)  Hallucination   Nitrofurantoin Other (See Comments)   Dizziness: Resulting in a fall   Lipitor [atorvastatin] Other (See Comments)   Muscle Weakness   Penicillins Rash   Has patient had a  PCN reaction causing immediate rash, facial/tongue/throat swelling, SOB or lightheadedness with hypotension: unknown Has patient had a PCN reaction causing severe rash involving mucus membranes or skin necrosis: unknown Has patient had a PCN reaction that required hospitalization: unknown Has patient had a PCN reaction occurring within the last 10 years: unknown If all of the above answers are "NO", then may proceed with Cephalosporin use. 2      Medication List    STOP taking these medications   cephALEXin 500 MG capsule Commonly known as:  KEFLEX   doxycycline 100 MG tablet Commonly known as:  VIBRA-TABS   HYDROcodone-acetaminophen 5-325 MG tablet Commonly known as:  NORCO/VICODIN   levofloxacin 500 MG tablet Commonly known as:  LEVAQUIN   lidocaine-prilocaine cream Commonly known as:  EMLA     TAKE these medications   aspirin EC 81 MG tablet Take 81 mg by mouth daily.   ATIVAN 0.5 MG tablet Generic drug:  LORazepam Take 1 tablet by mouth as needed.   feeding supplement (ENSURE ENLIVE) Liqd Take 237 mLs by mouth 2 (two) times daily between meals. What changed:    when to take this  reasons to take this   fish oil-omega-3 fatty acids 1000 MG capsule Take 1 g by mouth daily.   furosemide 20 MG tablet Commonly known as:  LASIX Take 20 mg by mouth daily.   GEMZAR IV Inject into the vein. Day 1,8,15 every 28 days   levothyroxine 100 MCG tablet Commonly known as:  SYNTHROID, LEVOTHROID Take 1 tablet (100 mcg total) by mouth daily before breakfast. What changed:    medication strength  how much to take   meclizine 25 MG tablet Commonly known as:  ANTIVERT Take 25 mg by mouth 4 (four) times daily as needed. Dizziness   metoprolol tartrate 25 MG tablet Commonly known as:  LOPRESSOR Take 12.5 mg by mouth 2 (two) times daily.   nitroGLYCERIN 0.4 MG SL tablet Commonly known as:  NITROSTAT Place 0.4 mg under the tongue every 5 (five) minutes x 3 doses as  needed. For chest pain.   ondansetron 4 MG tablet Commonly known as:  ZOFRAN Take 1 tablet (4 mg total) by mouth every 8 (eight) hours as needed for nausea or vomiting.   pantoprazole 40 MG tablet Commonly known as:  PROTONIX Take 1 tablet by mouth at bedtime.   potassium chloride 10 MEQ tablet Commonly known as:  K-DUR Take 1 tablet (10 mEq total) by mouth daily.   tamsulosin 0.4 MG Caps capsule Commonly known as:  FLOMAX Take 1 capsule (0.4 mg total) by mouth daily after supper.   traZODone 50 MG tablet Commonly known as:  DESYREL Take 1 tablet by mouth at bedtime.      Follow-up Information    Monico Blitz, MD. Schedule an appointment as soon as possible for a visit in 1 week(s).   Specialty:  Internal Medicine Contact information: [address]  [place] [address] [phone]          Allergies  Allergen Reactions  . Ciprofloxacin Other (See Comments)    Hallucination  . Nitrofurantoin Other (See Comments)    Dizziness: Resulting in a fall  . Lipitor [Atorvastatin] Other (See Comments)    Muscle Weakness  . Penicillins Rash  Has patient had a PCN reaction causing immediate rash, facial/tongue/throat swelling, SOB or lightheadedness with hypotension: unknown Has patient had a PCN reaction causing severe rash involving mucus membranes or skin necrosis: unknown Has patient had a PCN reaction that required hospitalization: unknown Has patient had a PCN reaction occurring within the last 10 years: unknown If all of the above answers are "NO", then may proceed with Cephalosporin use. 2      Procedures/Studies: Ct Abdomen Pelvis Wo Contrast  Result Date: 05/26/2017 CLINICAL DATA:  Abdominal distension, recent history of UTI, visual hallucinations EXAM: CT ABDOMEN AND PELVIS WITHOUT CONTRAST TECHNIQUE: Multidetector CT imaging of the abdomen and pelvis was performed following the standard protocol without IV contrast. COMPARISON:  01/19/2017 FINDINGS: Lower  chest: Trace right pleural effusion. Atelectasis/consolidation posteriorly in the right lower lobe. Coronary calcifications. Hepatobiliary: Gallbladder physiologically distended. Liver unremarkable. Pancreas: Marked parenchymal atrophy without mass or ductal dilatation. Spleen: Normal in size without focal abnormality. Adrenals/Urinary Tract: Enlargement of right renal mass which effaces the right renal pelvis and distends the central renal collecting system extending into the proximal ureter. This measures 4.4 cm transverse at the level of the central renal collecting system, previously 3.7 cm. The left kidney is unremarkable. Normal adrenals. Urinary bladder incompletely distended. Stomach/Bowel: Stomach is decompressed. Small bowel nondilated. Moderate proximal colonic fecal material, decompressed distally. No focal wall thickening or associated inflammatory change evident. Vascular/Lymphatic: Extensive aortoiliac calcified plaque without aneurysm. No abdominal or pelvic adenopathy localized. Bilateral pelvic phleboliths. Reproductive: Uterus and bilateral adnexa are unremarkable. Other: No ascites.  No free air. Musculoskeletal: Stable L2 compression deformity. Right hip arthroplasty hardware projects in expected location, distal extent not visualized. Advanced left hip DJD. No fracture or worrisome bone lesion. IMPRESSION: 1. No acute abdominal findings. 2. Trace right pleural effusion. 3. Continued enlargement of right renal/collecting system/ureteral mass. Electronically Signed   By: Lucrezia Europe M.D.   On: 05/26/2017 12:03   Dg Chest 2 View  Result Date: 05/26/2017 CLINICAL DATA:  Altered mental status and weakness for the past 4 days. History of renal cell carcinoma. EXAM: CHEST - 2 VIEW COMPARISON:  05/04/2017; 04/28/2017; 01/28/2017; chest CT-12/23/2016 FINDINGS: Grossly unchanged cardiac silhouette and mediastinal contours with atherosclerotic plaque within the thoracic aorta. Post coronary stent  placement. Stable positioning of support apparatus. Grossly unchanged trace left-sided effusion and associated left basilar opacities. No new focal airspace opacities. No definite evidence of edema. No acute osseus abnormalities. IMPRESSION: Unchanged trace left-sided effusion associated left basilar opacities, atelectasis versus infiltrate. Electronically Signed   By: Sandi Mariscal M.D.   On: 05/26/2017 09:16   Dg Chest 2 View  Result Date: 05/04/2017 CLINICAL DATA:  Altered mental state. EXAM: CHEST - 2 VIEW COMPARISON:  04/28/2017. FINDINGS: PowerPort catheter with tip over superior vena cava. Cardiomegaly with normal pulmonary vascularity. No focal infiltrate. Low lung volumes with left base subsegmental atelectasis. Small left pleural effusion. IMPRESSION: 1.  PowerPort catheter with tip over superior vena cava. 2.  Cardiomegaly with normal pulmonary vascularity. 3. Mild left base subsegmental atelectasis. Small left pleural effusion. Electronically Signed   By: Marcello Moores  Register   On: 05/04/2017 11:03   Ct Head Wo Contrast  Result Date: 05/26/2017 CLINICAL DATA:  Altered level of consciousness EXAM: CT HEAD WITHOUT CONTRAST TECHNIQUE: Contiguous axial images were obtained from the base of the skull through the vertex without intravenous contrast. COMPARISON:  05/04/2017 FINDINGS: Brain: Diffuse atrophic and chronic white matter ischemic changes are noted. No findings to suggest acute hemorrhage,  acute infarction or space-occupying mass lesion are noted. Vascular: No hyperdense vessel or unexpected calcification. Skull: Normal. Negative for fracture or focal lesion. Sinuses/Orbits: No acute finding. Other: None. IMPRESSION: Chronic atrophic and ischemic changes without acute abnormality. Electronically Signed   By: Inez Catalina M.D.   On: 05/26/2017 09:05   Ct Head Wo Contrast  Result Date: 05/04/2017 CLINICAL DATA:  Altered level of consciousness EXAM: CT HEAD WITHOUT CONTRAST TECHNIQUE: Contiguous  axial images were obtained from the base of the skull through the vertex without intravenous contrast. COMPARISON:  MRI 03/13/2017 FINDINGS: Brain: There is atrophy and chronic small vessel disease changes. No acute intracranial abnormality. Specifically, no hemorrhage, hydrocephalus, mass lesion, acute infarction, or significant intracranial injury. Vascular: No hyperdense vessel or unexpected calcification. Skull: No acute calvarial abnormality. Sinuses/Orbits: Visualized paranasal sinuses and mastoids clear. Orbital soft tissues unremarkable. Other: None IMPRESSION: No acute intracranial abnormality. Atrophy, chronic microvascular disease. Electronically Signed   By: Rolm Baptise M.D.   On: 05/04/2017 10:59       Subjective: No overnight events.  Mental status back to baseline.  Discharge Exam: Vitals:   05/28/17 0532 05/28/17 1350  BP: (!) 103/43 (!) 123/47  Pulse: 83 92  Resp: 16 16  Temp: 98.4 F (36.9 C) 98.3 F (36.8 C)  SpO2: 98% 95%   Vitals:   05/27/17 1534 05/27/17 2038 05/28/17 0532 05/28/17 1350  BP: (!) 96/53 (!) 115/56 (!) 103/43 (!) 123/47  Pulse: 77 86 83 92  Resp: 18 16 16 16   Temp: 98.5 F (36.9 C) 98.4 F (36.9 C) 98.4 F (36.9 C) 98.3 F (36.8 C)  TempSrc: Oral Oral Oral Oral  SpO2: 98% 97% 98% 95%  Weight:        General: Elderly female not in distress HEENT: Moist mucosa, supple neck Chest: Clear bilaterally CVS: Normal S1-S2, no murmurs GI: Soft, nondistended, nontender Musculoskeletal : Warm, no edema CNS: Alert and oriented x2     The results of significant diagnostics from this hospitalization (including imaging, microbiology, ancillary and laboratory) are listed below for reference.     Microbiology: Recent Results (from the past 240 hour(s))  Urine culture     Status: None   Collection Time: 05/26/17  7:29 AM  Result Value Ref Range Status   Specimen Description   Final    URINE, CLEAN CATCH Performed at [facility], [address], [place], [place] [address]    Special Requests   Final    NONE Performed at [facility], [address], [place], [place] [address]    Culture   Final    NO GROWTH Performed at [facility], [place] [address], [place], [place] [address]    Report Status 05/27/2017 FINAL  Final     Labs: BNP (last 3 results) Recent Labs    01/21/17 1343 01/28/17 2350  BNP 209.0* 38.4   Basic Metabolic Panel: Recent Labs  Lab 05/26/17 0805 05/27/17 0712 05/28/17 0618  NA 130* 130* 132*  K 4.4 4.2 3.9  CL 100* 101 105  CO2 23 22 21*  GLUCOSE 93 62* 108*  BUN 21* 19 16  CREATININE 2.68* 2.19* 1.90*  CALCIUM 9.0 8.3* 8.1*   Liver Function Tests: Recent Labs  Lab 05/26/17 0805  AST 16  ALT 7*  ALKPHOS 53  BILITOT 1.0  PROT 5.7*  ALBUMIN 2.5*   No results for input(s): LIPASE, AMYLASE in the last 168 hours. No results for input(s): AMMONIA in the last  168 hours. CBC: Recent Labs  Lab 05/26/17 0805 05/27/17 0712  WBC 3.6* 3.0*  NEUTROABS 2.1  --   HGB 9.3* 8.3*  HCT 27.8* 24.7*  MCV 96.2 96.9  PLT 147* 134*   Cardiac Enzymes: Recent Labs  Lab 05/26/17 0805  TROPONINI <0.03   BNP: Invalid input(s): POCBNP CBG: Recent Labs  Lab 05/27/17 1213  GLUCAP 120*   D-Dimer No results for input(s): DDIMER in the last 72 hours. Hgb A1c No results for input(s): HGBA1C in the last 72 hours. Lipid Profile No results for input(s): CHOL, HDL, LDLCALC, TRIG, CHOLHDL, LDLDIRECT in the last 72 hours. Thyroid function studies Recent Labs    05/26/17 0805  TSH 0.210*   Anemia work up Recent Labs    05/26/17 0729  VITAMINB12 162*   Urinalysis    Component Value Date/Time   COLORURINE YELLOW 05/26/2017 0729   APPEARANCEUR CLEAR 05/26/2017 0729   LABSPEC 1.010 05/26/2017 0729   PHURINE 5.0 05/26/2017 0729   GLUCOSEU NEGATIVE 05/26/2017 0729   HGBUR NEGATIVE 05/26/2017 0729   BILIRUBINUR NEGATIVE 05/26/2017 0729   KETONESUR NEGATIVE 05/26/2017 0729    PROTEINUR NEGATIVE 05/26/2017 0729   UROBILINOGEN 0.2 11/05/2014 2000   NITRITE NEGATIVE 05/26/2017 0729   LEUKOCYTESUR NEGATIVE 05/26/2017 0729   Sepsis Labs Invalid input(s): PROCALCITONIN,  WBC,  LACTICIDVEN Microbiology Recent Results (from the past 240 hour(s))  Urine culture     Status: None   Collection Time: 05/26/17  7:29 AM  Result Value Ref Range Status   Specimen Description   Final    URINE, CLEAN CATCH Performed at [facility], [address], [place], [place] [address]    Special Requests   Final    NONE Performed at [facility], [address], [place], [place] [address]    Culture   Final    NO GROWTH Performed at [facility], [place] [address], [place], [place] [address]    Report Status 05/27/2017 FINAL  Final     Time coordinating discharge: 35 minutes  SIGNED:   Louellen Molder, MD  [facility] 05/28/2017, 1:53 PM Pager   If 7PM-7AM, please contact night-coverage [email or web address] Password [email or web address]

## 2017-05-28 NOTE — Plan of Care (Signed)
  Problem: Acute Rehab PT Goals(only PT should resolve) Goal: Pt Will Go Supine/Side To Sit 05/28/2017 1153 by Clarene Critchley, PT Outcome: Progressing Flowsheets (Taken 05/28/2017 1153) Pt will go Supine/Side to Sit: with supervision 05/28/2017 1152 by Clarene Critchley, PT Outcome: Progressing Flowsheets (Taken 05/28/2017 1152) Pt will go Supine/Side to Sit: with supervision Goal: Patient Will Perform Sitting Balance 05/28/2017 1153 by Clarene Critchley, PT Outcome: Progressing Flowsheets (Taken 05/28/2017 1153) Patient will perform sitting balance: with supervision 05/28/2017 1152 by Clarene Critchley, PT Outcome: Progressing Flowsheets (Taken 05/28/2017 1152) Patient will perform sitting balance: with supervision Goal: Patient Will Transfer Sit To/From Stand 05/28/2017 1153 by Clarene Critchley, PT Outcome: Progressing Flowsheets (Taken 05/28/2017 1153) Patient will transfer sit to/from stand: with supervision 05/28/2017 1152 by Clarene Critchley, PT Outcome: Progressing Flowsheets (Taken 05/28/2017 1152) Patient will transfer sit to/from stand: with supervision Goal: Pt Will Transfer Bed To Chair/Chair To Bed 05/28/2017 1153 by Clarene Critchley, PT Outcome: Progressing Flowsheets (Taken 05/28/2017 1153) Pt will Transfer Bed to Chair/Chair to Bed: with supervision 05/28/2017 1152 by Clarene Critchley, PT Outcome: Progressing Flowsheets (Taken 05/28/2017 1152) Pt will Transfer Bed to Chair/Chair to Bed: with supervision Goal: Pt Will Ambulate 05/28/2017 1153 by Clarene Critchley, PT Outcome: Progressing Flowsheets (Taken 05/28/2017 1153) Pt will Ambulate: 15 feet;with rolling walker 05/28/2017 1152 by Clarene Critchley, PT Outcome: Progressing Flowsheets (Taken 05/28/2017 1152) Pt will Ambulate: with min guard assist;15 feet  Clarene Critchley PT, DPT 11:57 AM, 05/28/17 [phone]

## 2017-06-08 ENCOUNTER — Ambulatory Visit (INDEPENDENT_AMBULATORY_CARE_PROVIDER_SITE_OTHER): Payer: Medicare Other | Admitting: Otolaryngology

## 2017-07-09 ENCOUNTER — Encounter (HOSPITAL_COMMUNITY): Payer: Self-pay | Admitting: *Deleted

## 2017-07-09 ENCOUNTER — Emergency Department (HOSPITAL_COMMUNITY): Payer: Medicare Other

## 2017-07-09 ENCOUNTER — Emergency Department (HOSPITAL_COMMUNITY)
Admission: EM | Admit: 2017-07-09 | Discharge: 2017-07-09 | Disposition: A | Discharge status: Home / Self Care | Payer: Medicare Other | Attending: Emergency Medicine | Admitting: Emergency Medicine

## 2017-07-09 ENCOUNTER — Other Ambulatory Visit: Payer: Self-pay

## 2017-07-09 DIAGNOSIS — E1322 Other specified diabetes mellitus with diabetic chronic kidney disease: Secondary | ICD-10-CM | POA: Diagnosis not present

## 2017-07-09 DIAGNOSIS — F0390 Unspecified dementia without behavioral disturbance: Secondary | ICD-10-CM | POA: Diagnosis not present

## 2017-07-09 DIAGNOSIS — Z79899 Other long term (current) drug therapy: Secondary | ICD-10-CM | POA: Diagnosis not present

## 2017-07-09 DIAGNOSIS — I259 Chronic ischemic heart disease, unspecified: Secondary | ICD-10-CM | POA: Insufficient documentation

## 2017-07-09 DIAGNOSIS — I129 Hypertensive chronic kidney disease with stage 1 through stage 4 chronic kidney disease, or unspecified chronic kidney disease: Secondary | ICD-10-CM | POA: Diagnosis not present

## 2017-07-09 DIAGNOSIS — E039 Hypothyroidism, unspecified: Secondary | ICD-10-CM | POA: Diagnosis not present

## 2017-07-09 DIAGNOSIS — Z7982 Long term (current) use of aspirin: Secondary | ICD-10-CM | POA: Diagnosis not present

## 2017-07-09 DIAGNOSIS — R4182 Altered mental status, unspecified: Secondary | ICD-10-CM

## 2017-07-09 DIAGNOSIS — G4700 Insomnia, unspecified: Secondary | ICD-10-CM

## 2017-07-09 DIAGNOSIS — N184 Chronic kidney disease, stage 4 (severe): Secondary | ICD-10-CM | POA: Diagnosis not present

## 2017-07-09 LAB — COMPREHENSIVE METABOLIC PANEL
ALBUMIN: 2.3 g/dL — AB (ref 3.5–5.0)
ALT: 7 U/L (ref 0–44)
ANION GAP: 9 (ref 5–15)
AST: 16 U/L (ref 15–41)
Alkaline Phosphatase: 59 U/L (ref 38–126)
BILIRUBIN TOTAL: 0.8 mg/dL (ref 0.3–1.2)
BUN: 15 mg/dL (ref 8–23)
CHLORIDE: 98 mmol/L (ref 98–111)
CO2: 25 mmol/L (ref 22–32)
Calcium: 8.3 mg/dL — ABNORMAL LOW (ref 8.9–10.3)
Creatinine, Ser: 1.89 mg/dL — ABNORMAL HIGH (ref 0.44–1.00)
GFR calc Af Amer: 27 mL/min — ABNORMAL LOW (ref >60)
GFR calc non Af Amer: 23 mL/min — ABNORMAL LOW (ref >60)
GLUCOSE: 92 mg/dL (ref 70–99)
POTASSIUM: 4.1 mmol/L (ref 3.5–5.1)
Sodium: 132 mmol/L — ABNORMAL LOW (ref 135–145)
TOTAL PROTEIN: 5.6 g/dL — AB (ref 6.5–8.1)

## 2017-07-09 LAB — CBC WITH DIFFERENTIAL/PLATELET
BASOS ABS: 0 10*3/uL (ref 0.0–0.1)
BASOS PCT: 0 %
EOS ABS: 0.1 10*3/uL (ref 0.0–0.7)
Eosinophils Relative: 3 %
HCT: 30.6 % — ABNORMAL LOW (ref 36.0–46.0)
HEMOGLOBIN: 10.2 g/dL — AB (ref 12.0–15.0)
Lymphocytes Relative: 21 %
Lymphs Abs: 0.8 10*3/uL (ref 0.7–4.0)
MCH: 32.4 pg (ref 26.0–34.0)
MCHC: 33.3 g/dL (ref 30.0–36.0)
MCV: 97.1 fL (ref 78.0–100.0)
Monocytes Absolute: 0.5 10*3/uL (ref 0.1–1.0)
Monocytes Relative: 13 %
NEUTROS PCT: 63 %
Neutro Abs: 2.3 10*3/uL (ref 1.7–7.7)
Platelets: 149 10*3/uL — ABNORMAL LOW (ref 150–400)
RBC: 3.15 MIL/uL — ABNORMAL LOW (ref 3.87–5.11)
RDW: 13.4 % (ref 11.5–15.5)
WBC: 3.7 10*3/uL — ABNORMAL LOW (ref 4.0–10.5)

## 2017-07-09 LAB — URINALYSIS, ROUTINE W REFLEX MICROSCOPIC
Bilirubin Urine: NEGATIVE
GLUCOSE, UA: NEGATIVE mg/dL
Hgb urine dipstick: NEGATIVE
KETONES UR: NEGATIVE mg/dL
LEUKOCYTES UA: NEGATIVE
Nitrite: NEGATIVE
PROTEIN: NEGATIVE mg/dL
Specific Gravity, Urine: 1.012 (ref 1.005–1.030)
pH: 6 (ref 5.0–8.0)

## 2017-07-09 LAB — LACTIC ACID, PLASMA: Lactic Acid, Venous: 1.3 mmol/L (ref 0.5–1.9)

## 2017-07-09 LAB — PROTIME-INR
INR: 1.2
PROTHROMBIN TIME: 15.1 s (ref 11.4–15.2)

## 2017-07-09 LAB — TROPONIN I: Troponin I: <0.03 ng/mL (ref <0.03)

## 2017-07-09 MED ORDER — SODIUM CHLORIDE 0.9 % IV SOLN
INTRAVENOUS | Status: DC
  Administered 2017-07-09: 06:00:00 via INTRAVENOUS

## 2017-07-09 MED ORDER — SODIUM CHLORIDE 0.9 % IV BOLUS
250.0000 mL | Freq: Once | INTRAVENOUS | Status: AC
  Administered 2017-07-09: 250 mL via INTRAVENOUS

## 2017-07-09 MED ORDER — TRAZODONE HCL 50 MG PO TABS: 100.0000 mg | ORAL_TABLET | Freq: Every day | ORAL | Status: AC

## 2017-07-09 NOTE — ED Triage Notes (Signed)
Pt brought in by [facility] for c/o increased altered loc for the last couple of days per the family; pt has a hx of uti; pt cbg 88

## 2017-07-09 NOTE — ED Notes (Signed)
Urine specimen obtained and sent to lab

## 2017-07-09 NOTE — ED Notes (Signed)
X-ray at bedside

## 2017-07-09 NOTE — Discharge Instructions (Signed)
try increasing the trazodone to 100 mg in the evenings, follow-up with your primary care doctor next week to be rechecked

## 2017-07-09 NOTE — ED Notes (Signed)
EDP at bedside updating patient and family. 

## 2017-07-09 NOTE — ED Notes (Signed)
Pt has skin tear to right calf, asked daughter if the skin tear was present at home and she stated skin tear was not present at home and ems may have caused it when pt was moved from the bed to the stretcher at home.  Telfa and kerlix applied to right lower leg; daughter at bedside

## 2017-07-09 NOTE — ED Provider Notes (Signed)
Patient initially seen by Dr. Thurnell Garbe.  Please see her notes.  Laboratory evaluation is reassuring.  Patient has a stable anemia.  Mild renal insufficiency that is stable.  Urinalysis does not show an infection.  Chest x-ray and CT scan without any acute abnormalities.  Patient's behavioral disturbance is most likely related to dementia.  Daughter states the patient's not sleeping at night.  She is already on trazodone.  We can try increasing that to 100 mg and have her follow-up with her primary care doctor.   Dorie Rank, MD 07/09/17 [phone]

## 2017-07-09 NOTE — ED Notes (Signed)
Pt will need EMS to transport home. Secretary notified.

## 2017-07-09 NOTE — ED Provider Notes (Signed)
[facility] EMERGENCY DEPARTMENT Provider Note   CSN: [id number] Arrival date & time: 07/09/17  6073     History   Chief Complaint Chief Complaint  Patient presents with  . Altered Mental Status    HPI Joann Hunter is a 84 y.o. female.  The history is provided by a caregiver and the patient. The history is limited by the condition of the patient (Hx dementia).  Altered Mental Status    Pt was seen at 0520.  Per EMS and pt's family: Pt with gradual onset of increasing confusion from her baseline dementia for the past 2 weeks. Pt's daughter states pt's symptoms began shortly after her husband died. Has been associated with decreased appetite. Pt is largely bedbound. Pt's daughter states pt "gets like this when she is dehydrated or has a UTI." Pt has significant hx of dementia and currently denies any complaints. Family denies pt has had fevers, SOB/cough, CP, abd pain, vomiting/diarrhea.    Past Medical History:  Diagnosis Date  . Arthritis   . Back pain, chronic   . CAD (coronary artery disease)    NSTEMI 04/2011 with newly diagnosed 3V CAD s/p PCI/DES mLAD 04/11/11 with residual dz (per Dr. Burt Knack, should have consideration of PCI of the occluded RCA based on symptoms and/or consideration of outpatient nuclear perfusion testing after the patient recovers from her infarct)  . Cancer ([place])    kidney ( Nov.2017)  . Chronic kidney disease (CKD), stage IV (severe) ([place]) 12/13/2013  . Dementia   . Hyperglycemia   . Hyperlipidemia   . Hypertension   . Hypothyroidism   . Ischemic cardiomyopathy    EF 45% by cath 04/20/11, 50-55% by echo 04/22/11. hypotension limiting medication titration   . Myocardial infarction ([place]) 2013  . Type 2 diabetes with nephropathy ([place]) 07/26/2014  . UTI (urinary tract infection) 12/13/2013  . Vertigo     Patient Active Problem List   Diagnosis Date Noted  . Acute renal failure superimposed on stage 3 chronic kidney disease ([place]) 05/28/2017  . Acute  metabolic encephalopathy 71/06/2692  . B12 deficiency 05/28/2017  . Orthostatic hypotension   . DNR (do not resuscitate) discussion   . Palliative care by specialist   . Vasovagal syncope 03/11/2017  . Hypercalcemia of malignancy 03/11/2017  . Acute kidney injury ([place]) 01/29/2017  . Pain and swelling of left lower leg 01/21/2017  . Hip fracture, unspecified laterality, closed, initial encounter ([place]) 04/28/2016  . Ischemic cardiomyopathy 04/28/2016  . Hyperlipidemia 04/28/2016  . Cancer ([place]) 04/28/2016  . Hip fracture ([place]) 04/28/2016  . Dehydration 04/14/2016  . Orthostasis 02/24/2016  . Confusion 02/21/2016  . Neutropenia ([place]) 02/20/2016  . Thrombocytopenia ([place]) 02/20/2016  . ESBL (extended spectrum beta-lactamase) producing bacteria infection 02/20/2016  . Goals of care, counseling/discussion 02/02/2016  . Urothelial cancer ([place]) 01/01/2016  . Absolute anemia 11/05/2014  . Hypothyroidism 11/05/2014  . Type 2 diabetes with nephropathy ([place]) 07/26/2014  . Essential hypertension 07/26/2014  . Hyponatremia 07/25/2014  . Vertigo 07/25/2014  . Fall 12/13/2013  . Chest pain at rest 04/26/2011  . CAD (coronary artery disease) 04/23/2011  . Cardiomyopathy, ischemic 04/23/2011  . Dyslipidemia 04/23/2011  . Myocardial infarction, anterior wall, initial care ([place]) 04/20/2011    Past Surgical History:  Procedure Laterality Date  . BACK SURGERY    . BREAST LUMPECTOMY     right  . CORONARY STENT PLACEMENT    . HIP ARTHROPLASTY Right 04/29/2016   Procedure: ARTHROPLASTY BIPOLAR HIP (HEMIARTHROPLASTY);  Surgeon:  Carole Civil, MD;  Location: [facility];  Service: Orthopedics;  Laterality: Right;  . IR FLUORO GUIDE PORT INSERTION RIGHT  01/13/2017  . IR US GUIDE VASC ACCESS RIGHT  01/13/2017  . LEFT HEART CATHETERIZATION WITH CORONARY ANGIOGRAM N/A 04/20/2011   Procedure: LEFT HEART CATHETERIZATION WITH CORONARY ANGIOGRAM;  Surgeon: Burnell Blanks, MD;  Location: [facility] CATH LAB;   Service: Cardiovascular;  Laterality: N/A;  . PERCUTANEOUS CORONARY STENT INTERVENTION (PCI-S) N/A 04/21/2011   Procedure: PERCUTANEOUS CORONARY STENT INTERVENTION (PCI-S);  Surgeon: Sherren Mocha, MD;  Location: [facility] C[facility] LAB;  Service: Cardiovascular;  Laterality: N/A;     OB History    Gravida  2   Para  2   Term  2   Preterm      AB      Living  2     SAB      TAB      Ectopic      Multiple      Live Births               Home Medications    Prior to Admission medications   Medication Sig Start Date End Date Taking? Authorizing Provider  aspirin EC 81 MG tablet Take 81 mg by mouth daily.    [provider]  feeding supplement, ENSURE ENLIVE, (ENSURE ENLIVE) LIQD Take 237 mLs by mouth 2 (two) times daily between meals. Patient taking differently: Take 237 mLs by mouth as needed.  05/02/16   Elgergawy, Silver Huguenin, MD  fish oil-omega-3 fatty acids 1000 MG capsule Take 1 g by mouth daily.    [provider]  furosemide (LASIX) 20 MG tablet Take 20 mg by mouth daily.  04/09/17   [provider]  Gemcitabine HCl (GEMZAR IV) Inject into the vein. Day 1,8,15 every 28 days    [provider]  levothyroxine (SYNTHROID, LEVOTHROID) 100 MCG tablet Take 1 tablet (100 mcg total) by mouth daily before breakfast. 05/28/17   Dhungel, Nishant, MD  LORazepam (ATIVAN) 0.5 MG tablet Take 1 tablet by mouth as needed.  05/03/16   [provider]  meclizine (ANTIVERT) 25 MG tablet Take 25 mg by mouth 4 (four) times daily as needed. Dizziness    [provider]  metoprolol tartrate (LOPRESSOR) 25 MG tablet Take 12.5 mg by mouth 2 (two) times daily. 10/24/16   [provider]  nitroGLYCERIN (NITROSTAT) 0.4 MG SL tablet Place 0.4 mg under the tongue every 5 (five) minutes x 3 doses as needed. For chest pain. 04/23/11   Dunn, Nedra Hai, PA-C  ondansetron (ZOFRAN) 4 MG tablet Take 1 tablet (4 mg total) by mouth every 8 (eight) hours as needed  for nausea or vomiting. 03/16/17   Thurnell Lose, MD  pantoprazole (PROTONIX) 40 MG tablet Take 1 tablet by mouth at bedtime. 09/29/16   [provider]  potassium chloride (K-DUR) 10 MEQ tablet Take 1 tablet (10 mEq total) by mouth daily. 03/16/17   Thurnell Lose, MD  tamsulosin (FLOMAX) 0.4 MG CAPS capsule Take 1 capsule (0.4 mg total) by mouth daily after supper. 03/16/17   Thurnell Lose, MD  traZODone (DESYREL) 50 MG tablet Take 1 tablet by mouth at bedtime. 05/31/16   [provider]    Family History Family History  Problem Relation Age of Onset  . Other Mother   . Cancer Father   . Other Unknown        no known family cardiac disease  .  Other Brother        Diptheria  . Depression Sister   . Pneumonia Sister   . Other Brother        murdered    Social History Social History   Tobacco Use  . Smoking status: Never Smoker  . Smokeless tobacco: Never Used  Substance Use Topics  . Alcohol use: No  . Drug use: No     Allergies   Ciprofloxacin; Nitrofurantoin; Lipitor [atorvastatin]; and Penicillins   Review of Systems Review of Systems  Unable to perform ROS: Dementia     Physical Exam Updated Vital Signs BP 112/63 (BP Location: Left Arm)   Pulse 81   Temp 99.4 F (37.4 C) (Rectal)   Resp 16   SpO2 96%   Physical Exam 0525: Physical examination:  Nursing notes reviewed; Vital signs and O2 SAT reviewed;  Constitutional: Well developed, Well nourished, In no acute distress; Head:  Normocephalic, atraumatic; Eyes: EOMI, PERRL, No scleral icterus; ENMT: Mouth and pharynx normal, Mucous membranes dry; Neck: Supple, Full range of motion, No lymphadenopathy; Cardiovascular: Regular rate and rhythm, No gallop; Respiratory: Breath sounds clear & equal bilaterally, No wheezes.  Speaking full sentences with ease, Normal respiratory effort/excursion; Chest: Nontender, Movement normal; Abdomen: Soft, Nontender, Nondistended, Normal bowel sounds;  Genitourinary: No CVA tenderness; Extremities: Peripheral pulses normal, No tenderness, No edema, No calf edema or asymmetry.; Neuro: Awake, alert, confused per hx dementia. Talkative in clear voice w/staff and family.  No facial droop.  Grips equal. Moves all ext well without apparent gross focal motor deficits..; Skin: Color normal, Warm, Dry.   ED Treatments / Results  Labs (all labs ordered are listed, but only abnormal results are displayed)   EKG EKG Interpretation  Date/Time:  Sunday July 09 2017 05:33:36 EDT Ventricular Rate:  77 PR Interval:    QRS Duration: 107 QT Interval:  406 QTC Calculation: 460 R Axis:   29 Text Interpretation:  Sinus rhythm Prolonged PR interval Low voltage, precordial leads Artifact When compared with ECG of 05/26/2017 No significant change was found Confirmed by Francine Graven [phone]) on 07/09/2017 5:57:59 AM   Radiology   Procedures Procedures (including critical care time)  Medications Ordered in ED Medications  sodium chloride 0.9 % bolus 250 mL (250 mLs Intravenous New Bag/Given 07/09/17 0543)  0.9 %  sodium chloride infusion ( Intravenous New Bag/Given 07/09/17 0542)     Initial Impression / Assessment and Plan / ED Course  I have reviewed the triage vital signs and the nursing notes.  Pertinent labs & imaging results that were available during my care of the patient were reviewed by me and considered in my medical decision making (see chart for details).  MDM Reviewed: previous chart, nursing note and vitals Reviewed previous: ECG Interpretation: ECG    0715:  Workup pending. Dispo based on results. Sign out to Dr. Tomi Bamberger.     Final Clinical Impressions(s) / ED Diagnoses   Final diagnoses:  None    ED Discharge Orders    None       Francine Graven, DO 07/09/17 [id number]

## 2017-07-09 NOTE — ED Notes (Signed)
[facility] arrived to transport patient home.

## 2017-07-10 LAB — URINE CULTURE: Culture: NO GROWTH

## 2017-09-03 DEATH — deceased

## 2018-02-19 IMAGING — DX DG FOREARM 2V*R*
2 series · 2 of 2 positions shown · non-contrast
Comparison: None.

CLINICAL DATA: Patient fell today at home. States that she was not
dizzy, but cannot remember what caused her to fall. Family states
that she was taken off of her blood pressure meds because they were
causing hypotension. States that she has fallen twice this week and
was seen by her physician on [REDACTED] for the fall. She had hit her
head and had multiple bruises on her hands and arms.

EXAM:
RIGHT FOREARM - 2 VIEW

[forearm ap]
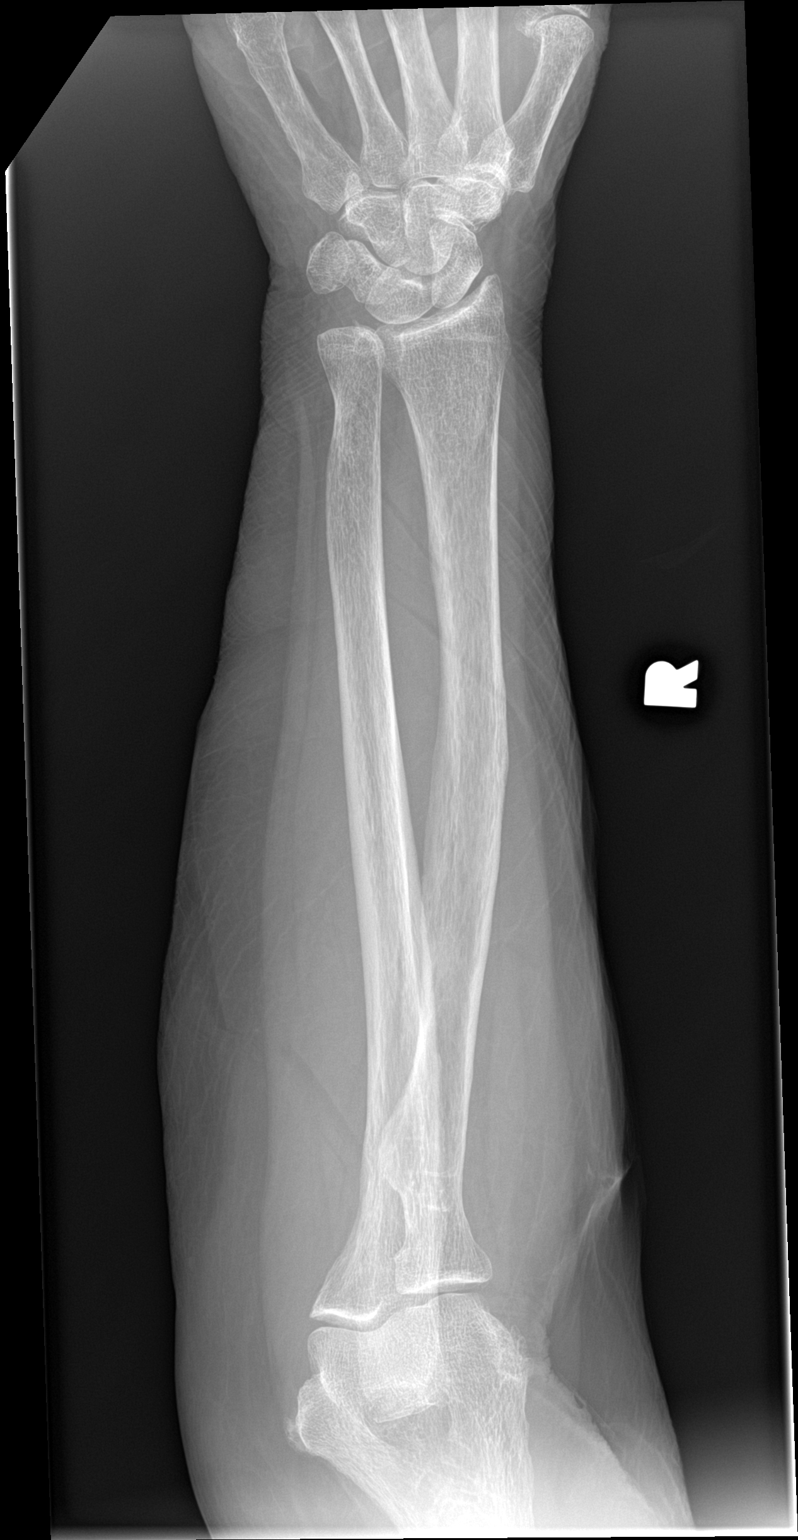

[forearm lat]
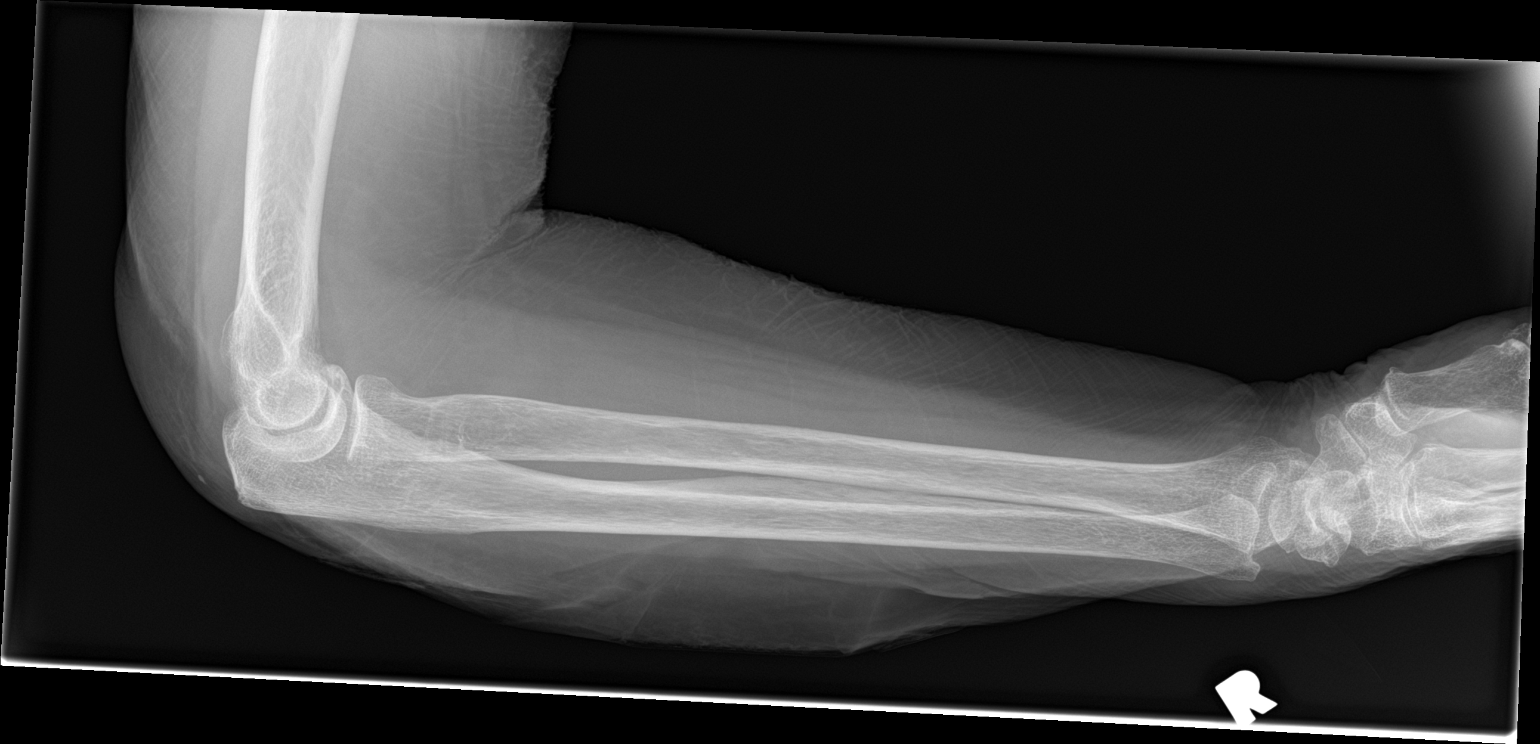

[2 of 2 positions shown; findings below may reference images not displayed]

FINDINGS: No fracture.  No bone lesion.  The bones are demineralized.

The elbow and wrist joints are normally aligned.

The soft tissues are unremarkable.
IMPRESSION: No fracture, bone lesion or dislocation.

## 2018-05-05 IMAGING — US US EXTREM LOW VENOUS*L*
1 series · 13 of 24 positions shown · non-contrast
Comparison: None.

CLINICAL DATA: Left lower extremity pain and swelling. Shortness of
breath.



[Series 1: us extrem low venous*left* · 0.08mm/px · 13 of 35 slices shown]
[im 1/35]
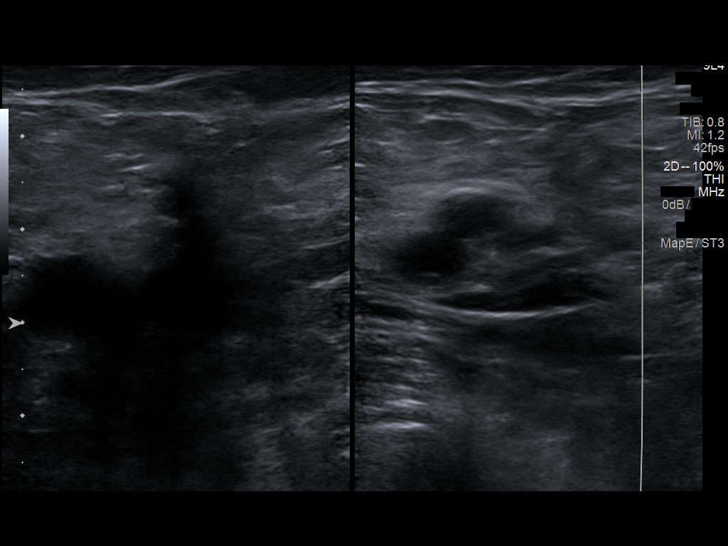
[im 3/35]
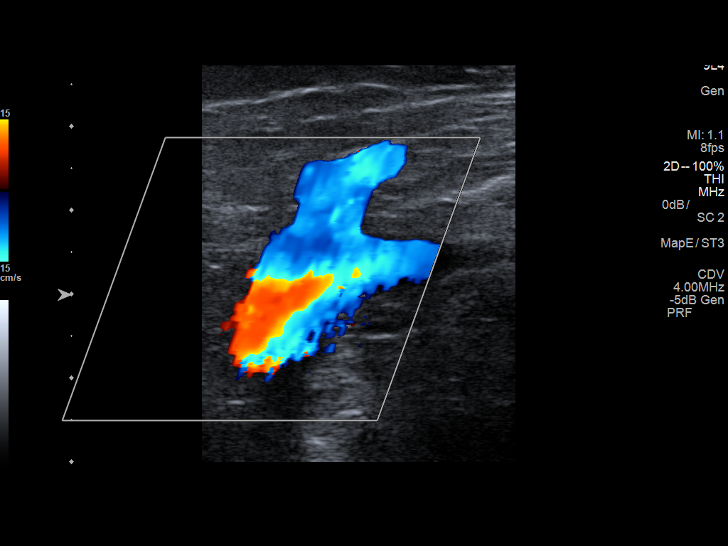
[im 6/35]
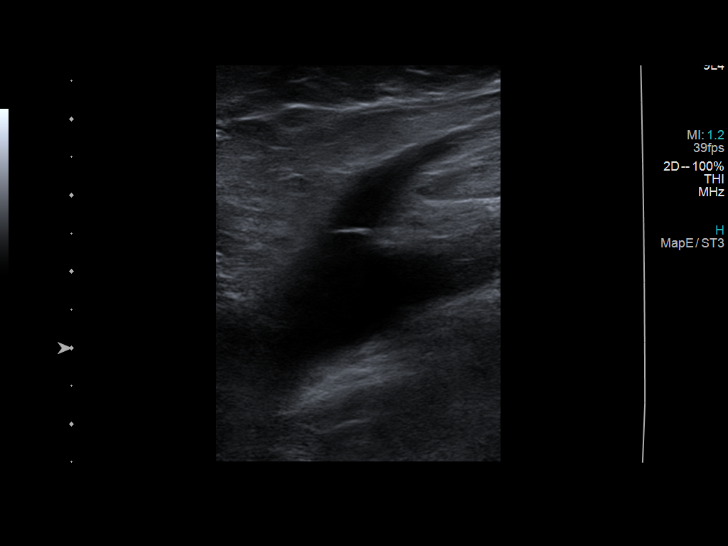
[im 9/35]
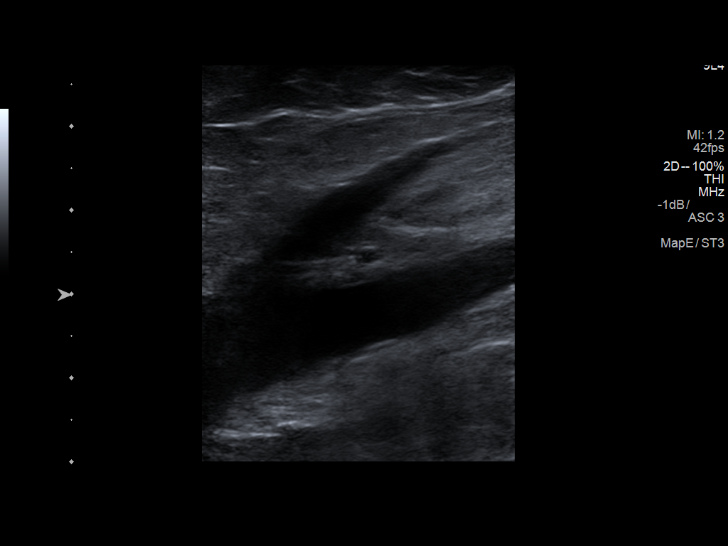
[im 12/35]
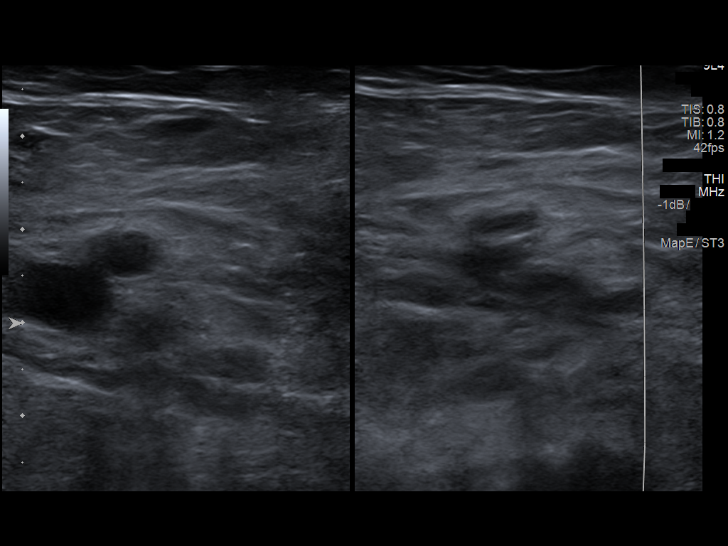
[im 15/35]
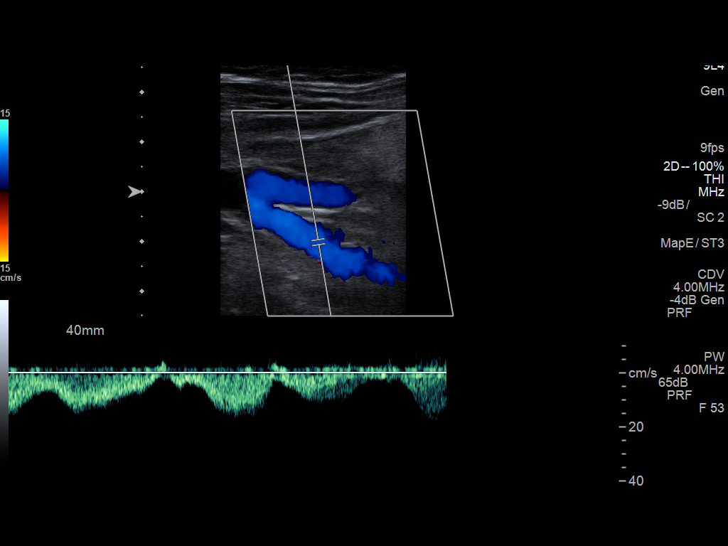
[im 18/35]
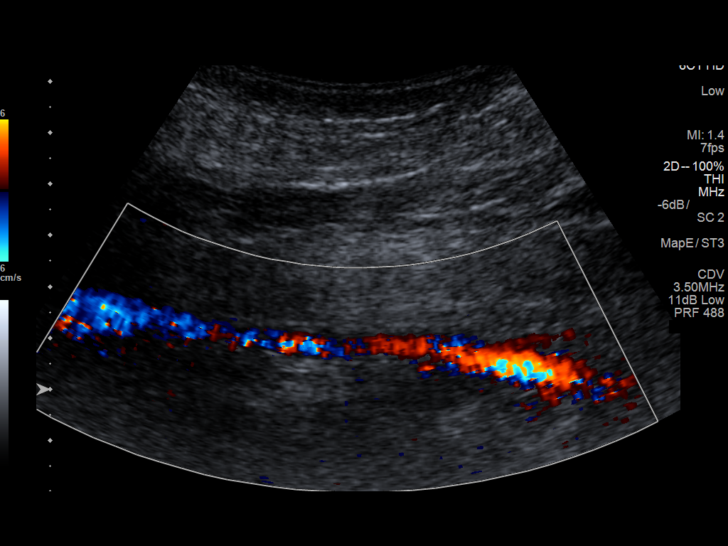
[im 20/35]
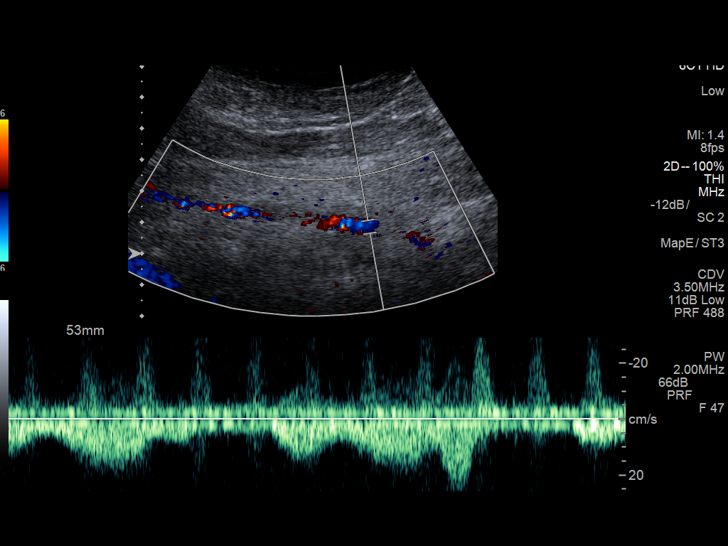
[im 23/35]
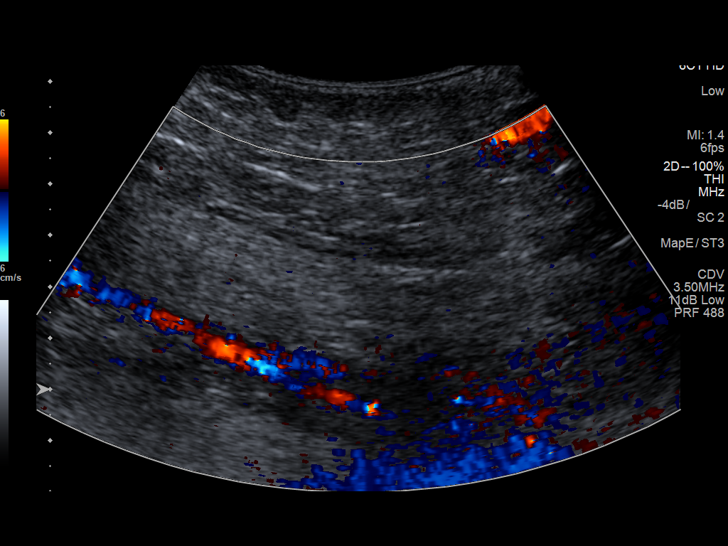
[im 26/35]
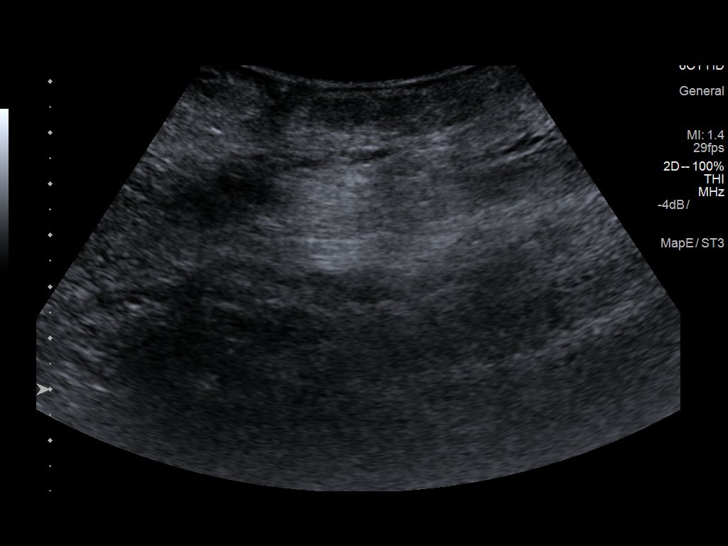
[im 29/35]
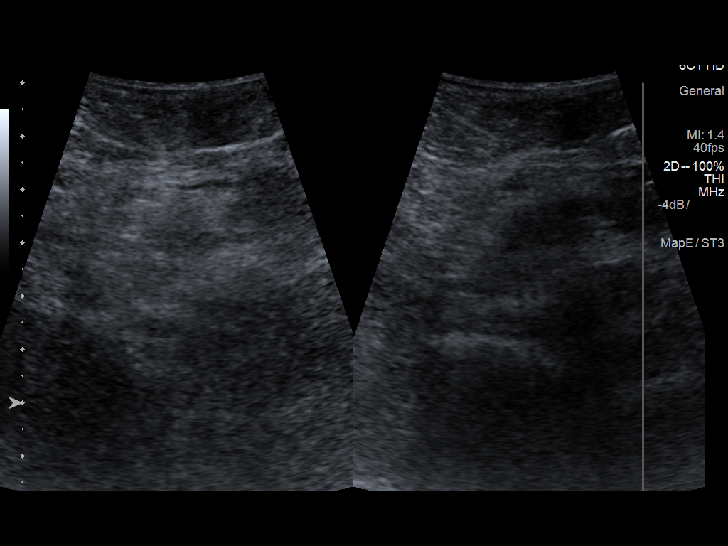
[im 32/35]
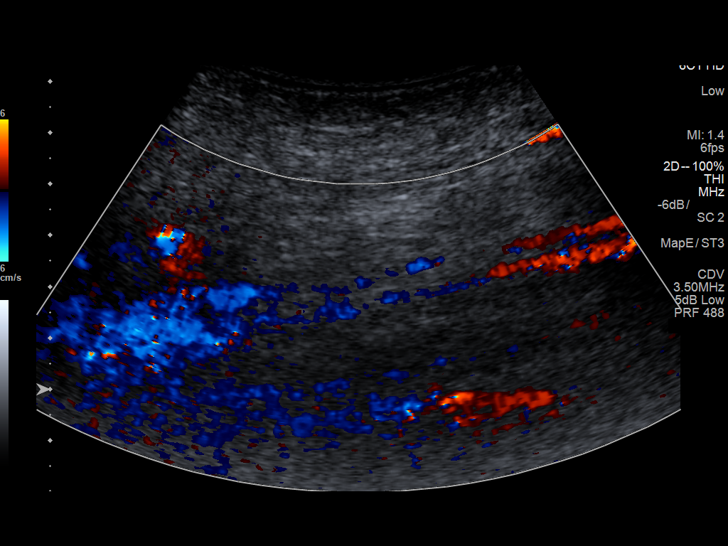
[im 35/35]
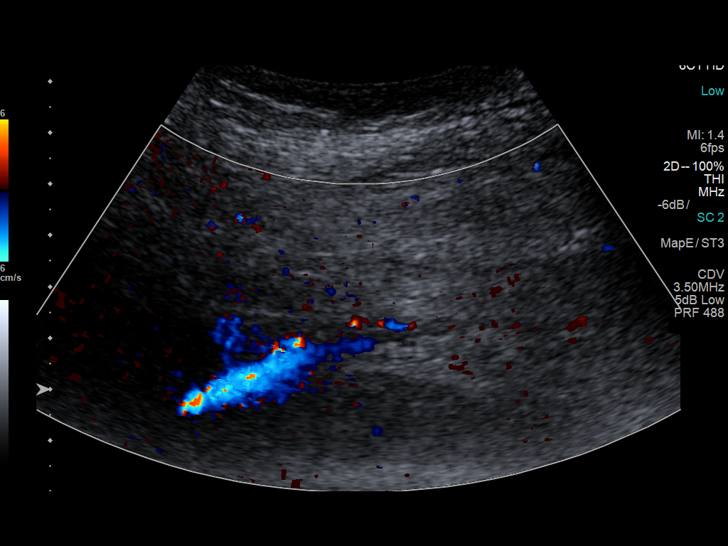

[13 of 24 positions shown; findings below may reference images not displayed]

FINDINGS: Contralateral Common Femoral Vein: Respiratory phasicity is normal
and symmetric with the symptomatic side. No evidence of thrombus.
Normal compressibility.

Common Femoral Vein: No evidence of thrombus. Normal
compressibility, respiratory phasicity and response to augmentation.

Saphenofemoral Junction: No evidence of thrombus. Normal
compressibility and flow on color Doppler imaging.

Profunda Femoral Vein: No evidence of thrombus. Normal
compressibility and flow on color Doppler imaging.

Femoral Vein: No evidence of thrombus. Normal compressibility,
respiratory phasicity and response to augmentation.

Popliteal Vein: No evidence of thrombus. Normal compressibility,
respiratory phasicity and response to augmentation.

Calf Veins: No evidence of thrombus. Normal compressibility and flow
on color Doppler imaging.

Superficial Great Saphenous Vein: No evidence of thrombus. Normal
compressibility.

Venous Reflux:  None.

Other Findings:  None.
IMPRESSION: No evidence of deep venous thrombosis.

## 2018-12-11 IMAGING — CR DG CHEST 1V PORT
1 series · 1 of 1 positions shown · non-contrast
Comparison: 04/27/2016

CLINICAL DATA: Shortness of breath and wheezing

EXAM:
PORTABLE CHEST 1 VIEW

[portable]
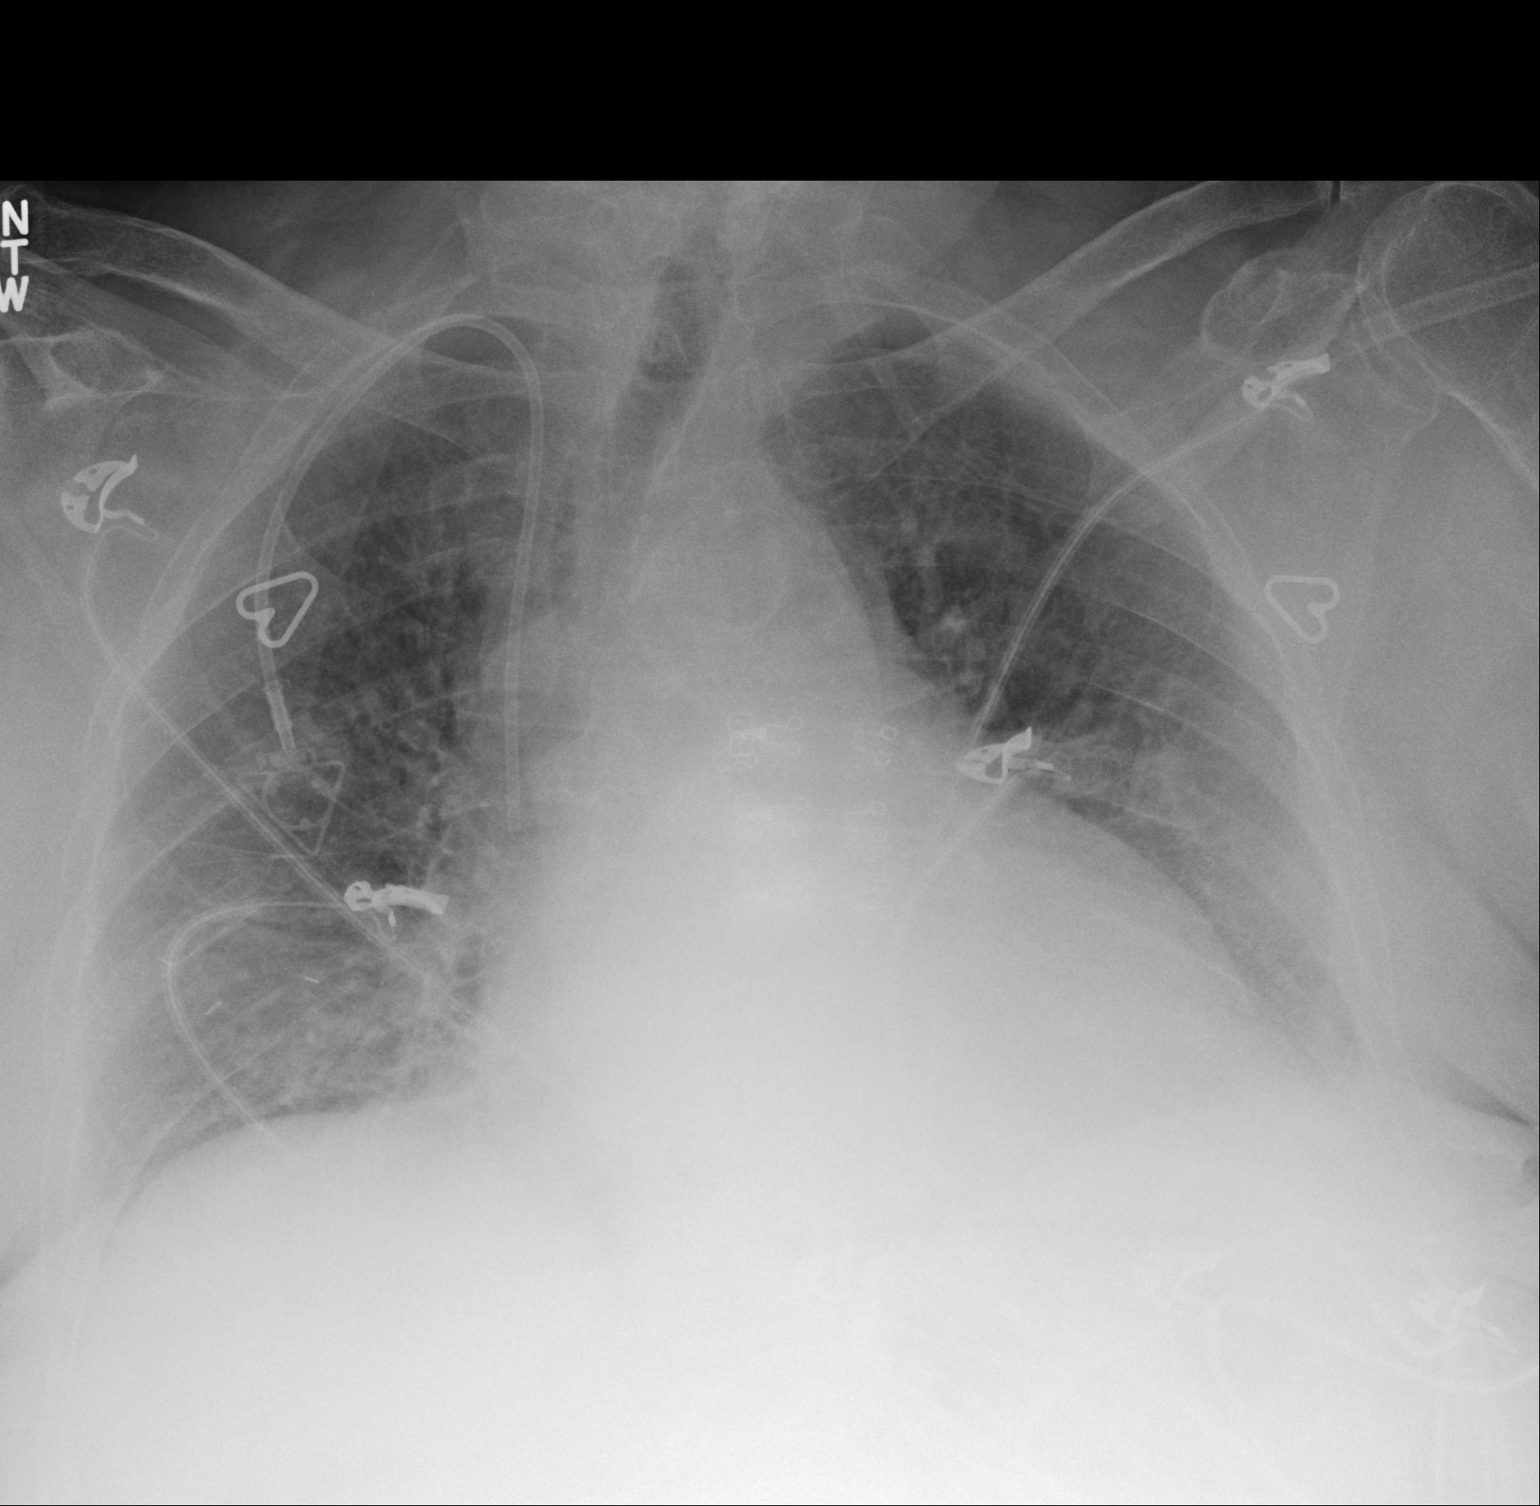

[1 of 1 positions shown; findings below may reference images not displayed]

FINDINGS: Right IJ power port catheter tip lower SVC level. Marked
cardiomegaly with mild central edema pattern, suspicious for volume
overload or early CHF. Basilar atelectasis noted. No large effusion
or pneumothorax. Upper lobes remain clear. Trachea is midline. Aorta
is atherosclerotic. Bones are osteopenic. Degenerative changes of
the spine and shoulders.
IMPRESSION: Cardiomegaly with mild central edema as above.

Basilar atelectasis

Thoracic aortic atherosclerosis

## 2018-12-19 IMAGING — MR MR HEAD W/O CM
9 of 10 series · 36 of 48 positions shown · non-contrast
Comparison: Head CT 01/15/2017

CLINICAL DATA: Vertigo

EXAM:
MRI HEAD WITHOUT CONTRAST
TECHNIQUE: Multiplanar, multiecho pulse sequences of the brain and surrounding
structures were obtained without intravenous contrast.

[Series 3: DWI · axial · 3.0mm · 1.09mm/px · z∈[-38,+104]mm · 9 of 98 slices shown (1 of 4)]
[im 1/98]
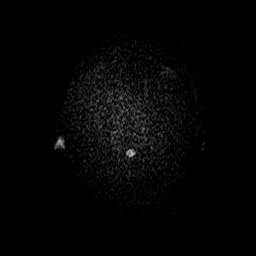
[im 13/98]
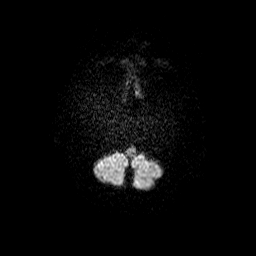
[im 25/98]
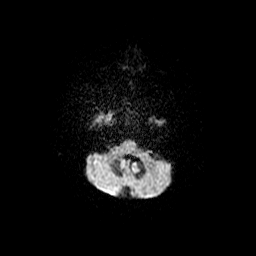
[im 37/98]
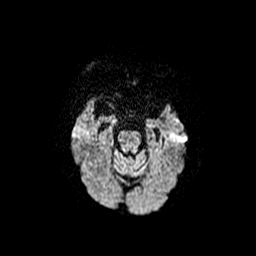
[im 49/98]
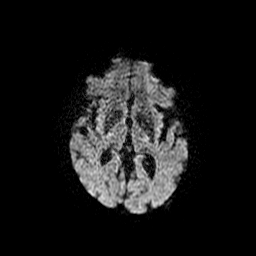
[im 61/98]
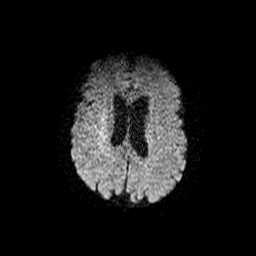
[im 73/98]
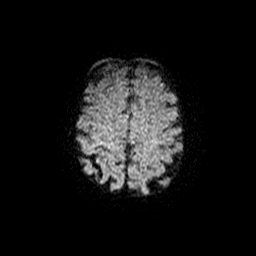
[im 85/98]
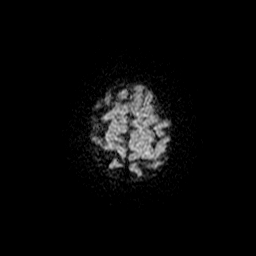
[im 98/98]
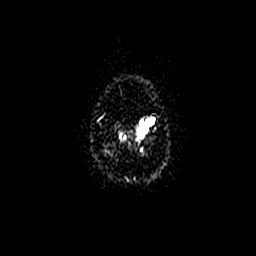

[Series 4: DWI · coronal · 5.0mm · 1.09mm/px · 7 of 66 slices shown (2 of 4)]
[im 1/66]
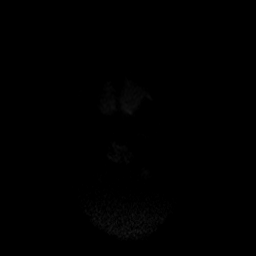
[im 11/66]
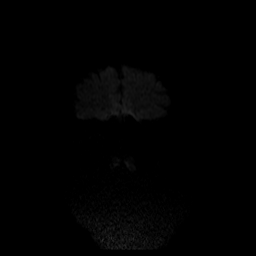
[im 22/66]
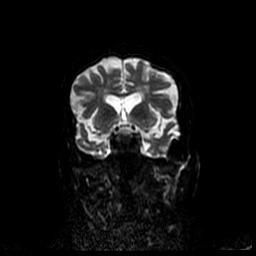
[im 33/66]
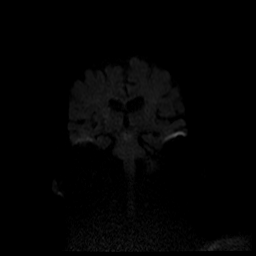
[im 44/66]
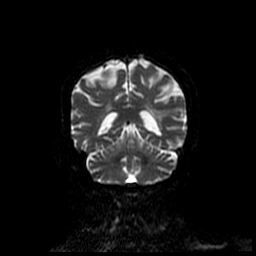
[im 55/66]
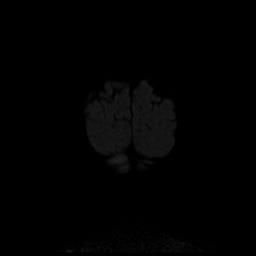
[im 66/66]
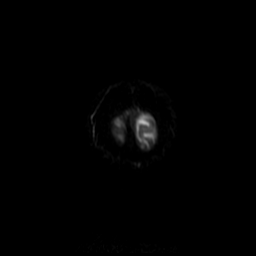

[Series 5: T1 · sagittal · 5.0mm · 0.47mm/px · 2 of 23 slices shown]
[im 1/23]
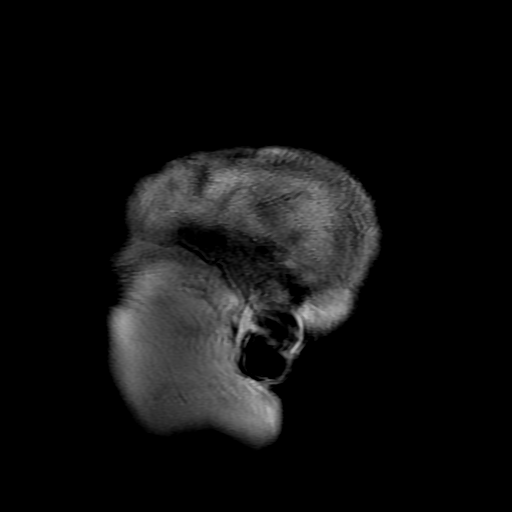
[im 23/23]
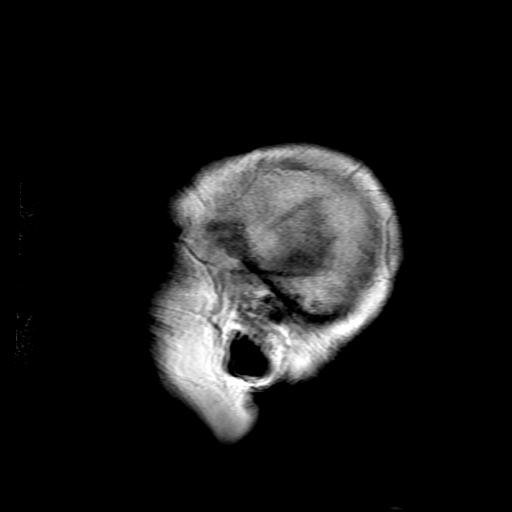

[Series 6: T2 · axial · 5.0mm · 0.43mm/px · z∈[-40,+99]mm · 3 of 25 slices shown (1 of 2)]
[im 1/25]
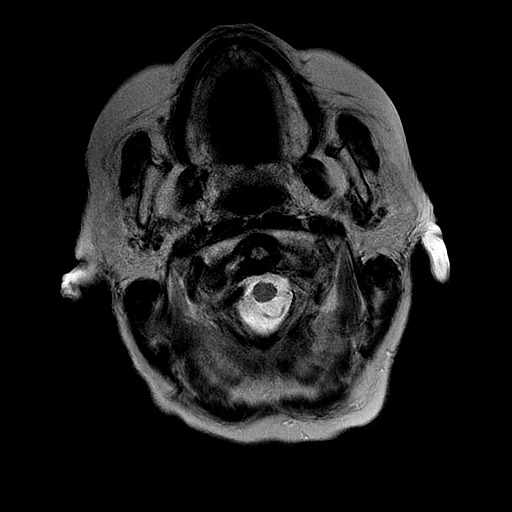
[im 13/25]
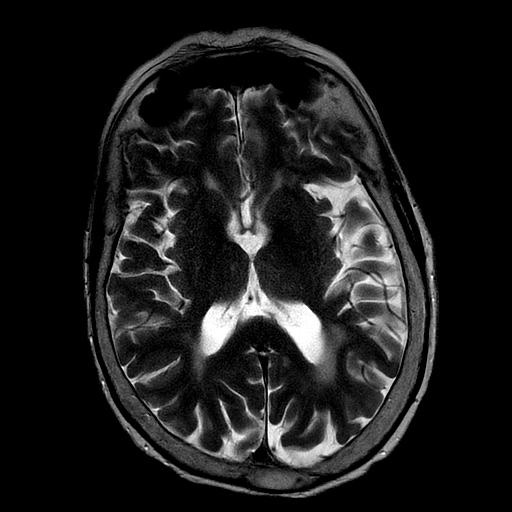
[im 25/25]
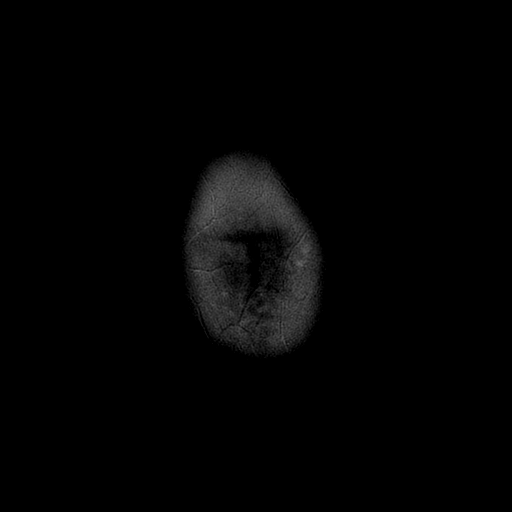

[Series 7: FLAIR · axial · 5.0mm · 0.43mm/px · z∈[-40,+99]mm · 3 of 25 slices shown]
[im 1/25]
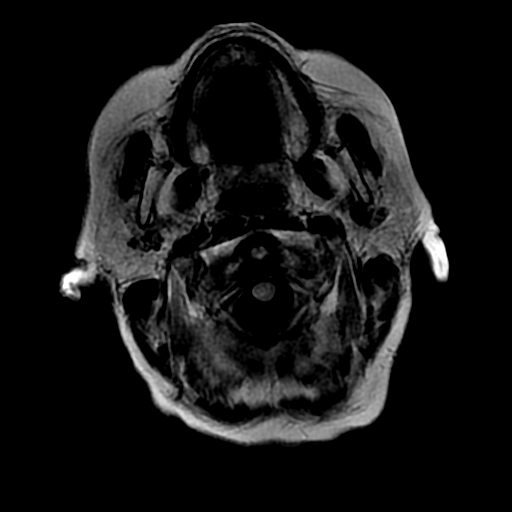
[im 13/25]
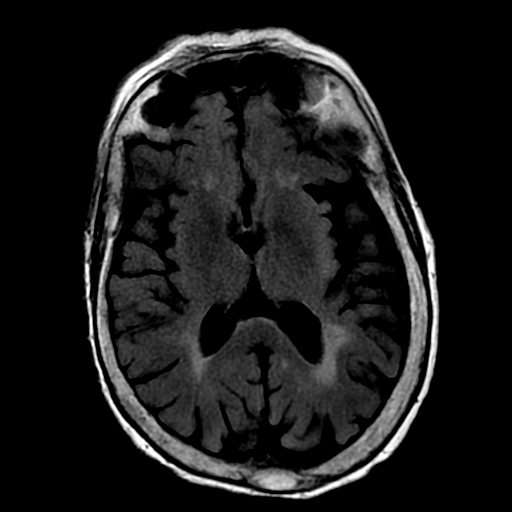
[im 25/25]
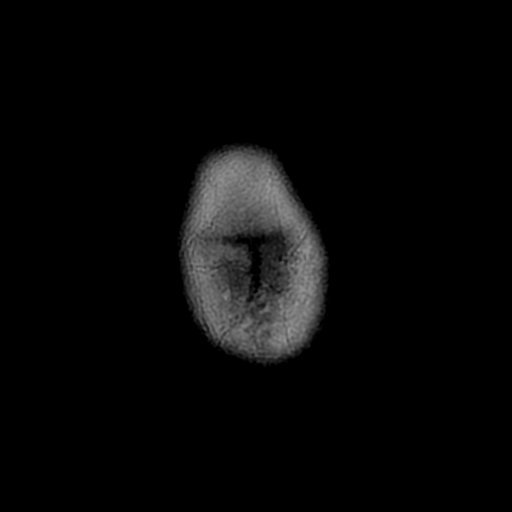

[Series 8: ax mpgr · axial · 5.0mm · 0.43mm/px · 1 of 25 slices shown]
[im 1/25]
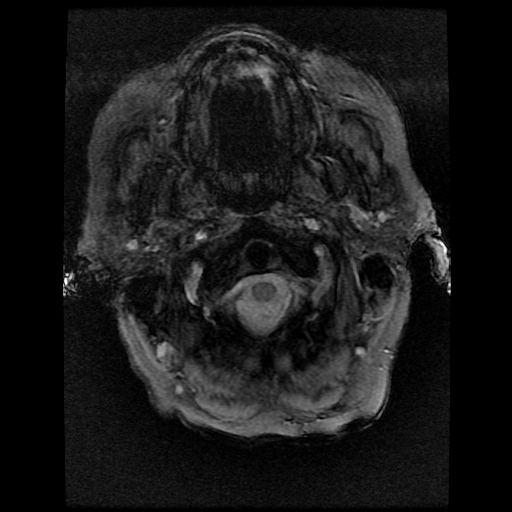

[Series 10: T2 · coronal · 5.0mm · 0.39mm/px · 3 of 25 slices shown (2 of 2)]
[im 1/25]
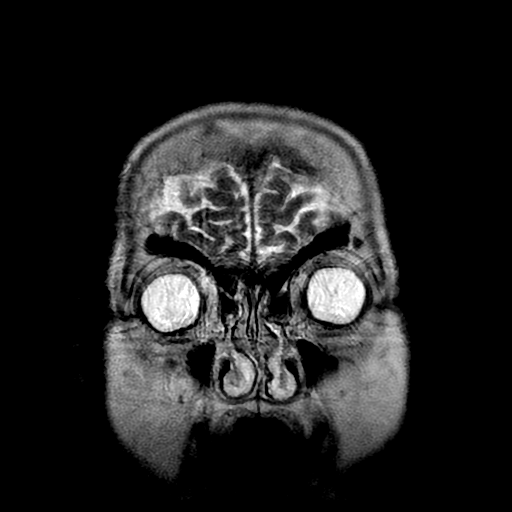
[im 13/25]
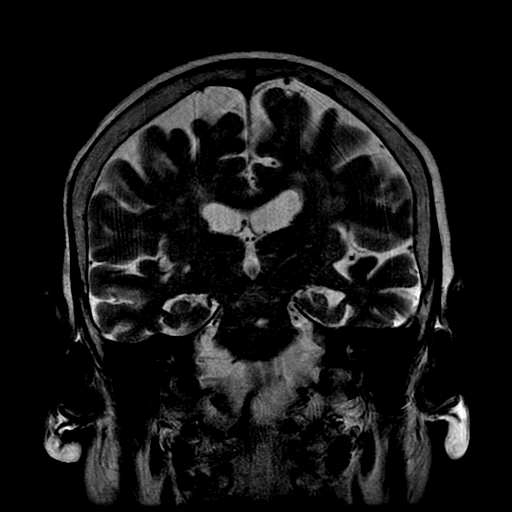
[im 25/25]
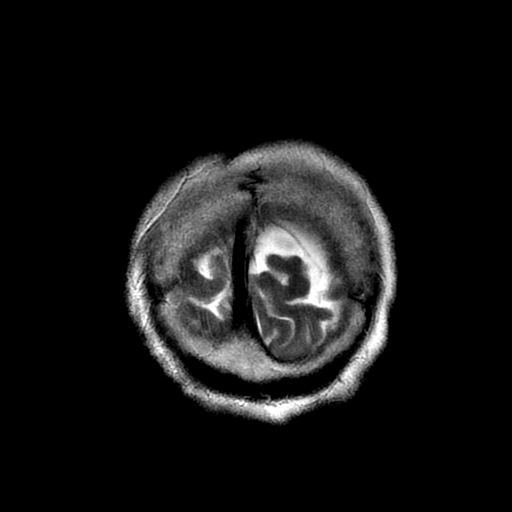

[Series 300: DWI · axial · 3.0mm · 1.09mm/px · z∈[-38,+104]mm · 5 of 49 slices shown (3 of 4)]
[im 1/49]
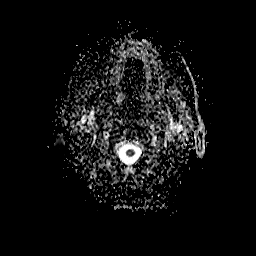
[im 13/49]
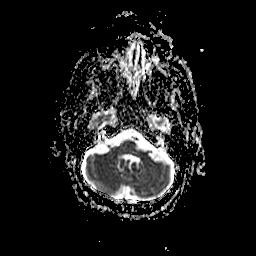
[im 25/49]
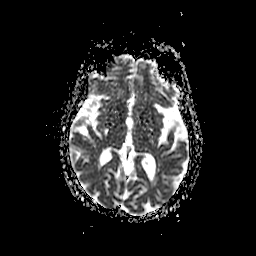
[im 37/49]
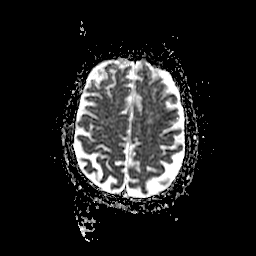
[im 49/49]
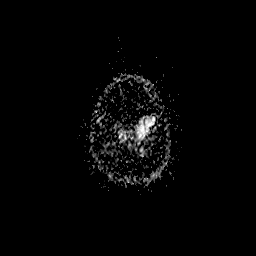

[Series 400: DWI · coronal · 5.0mm · 1.09mm/px · 3 of 33 slices shown (4 of 4)]
[im 1/33]
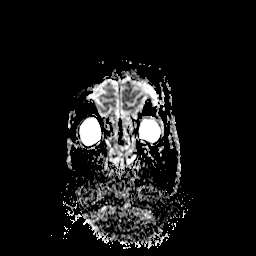
[im 17/33]
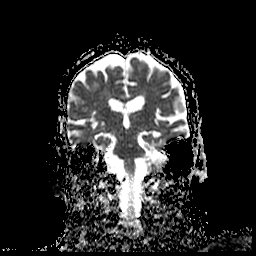
[im 33/33]
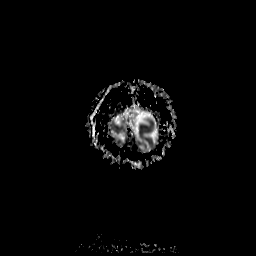

[36 of 48 positions shown; findings below may reference images not displayed]

FINDINGS: Brain: The midline structures are normal. There is no acute infarct
or acute hemorrhage. No mass lesion, hydrocephalus, dural
abnormality or extra-axial collection. There is multifocal white
matter hyperintensity suggesting chronic ischemic microangiopathy.
No age-advanced or lobar predominant atrophy. No chronic
microhemorrhage or superficial siderosis.

Vascular: Major intracranial arterial and venous sinus flow voids
are preserved.

Skull and upper cervical spine: The visualized skull base,
calvarium, upper cervical spine and extracranial soft tissues are
normal.

Sinuses/Orbits: No fluid levels or advanced mucosal thickening. No
mastoid or middle ear effusion. Normal orbits.
IMPRESSION: Chronic ischemic microangiopathy without acute intracranial
abnormality.
